# Patient Record
Sex: Male | Born: 1940 | Race: White | Hispanic: No | Marital: Single | Smoking: Never smoker
Health system: Southern US, Community
[De-identification: ages and names within clinical notes are randomized; demographics above are authoritative.]

## PROBLEM LIST (undated history)

## (undated) DIAGNOSIS — M7552 Bursitis of left shoulder: Secondary | ICD-10-CM

## (undated) DIAGNOSIS — Z8719 Personal history of other diseases of the digestive system: Secondary | ICD-10-CM

## (undated) DIAGNOSIS — I10 Essential (primary) hypertension: Secondary | ICD-10-CM

## (undated) DIAGNOSIS — Z9289 Personal history of other medical treatment: Secondary | ICD-10-CM

## (undated) DIAGNOSIS — G8929 Other chronic pain: Secondary | ICD-10-CM

## (undated) DIAGNOSIS — I442 Atrioventricular block, complete: Secondary | ICD-10-CM

## (undated) DIAGNOSIS — Z9861 Coronary angioplasty status: Secondary | ICD-10-CM

## (undated) DIAGNOSIS — I255 Ischemic cardiomyopathy: Secondary | ICD-10-CM

## (undated) DIAGNOSIS — I4891 Unspecified atrial fibrillation: Secondary | ICD-10-CM

## (undated) DIAGNOSIS — E782 Mixed hyperlipidemia: Secondary | ICD-10-CM

## (undated) DIAGNOSIS — G709 Myoneural disorder, unspecified: Secondary | ICD-10-CM

## (undated) DIAGNOSIS — M75102 Unspecified rotator cuff tear or rupture of left shoulder, not specified as traumatic: Secondary | ICD-10-CM

## (undated) DIAGNOSIS — I480 Paroxysmal atrial fibrillation: Secondary | ICD-10-CM

## (undated) DIAGNOSIS — G4733 Obstructive sleep apnea (adult) (pediatric): Secondary | ICD-10-CM

## (undated) DIAGNOSIS — I2510 Atherosclerotic heart disease of native coronary artery without angina pectoris: Secondary | ICD-10-CM

## (undated) DIAGNOSIS — R351 Nocturia: Secondary | ICD-10-CM

## (undated) DIAGNOSIS — M1990 Unspecified osteoarthritis, unspecified site: Secondary | ICD-10-CM

## (undated) DIAGNOSIS — Z951 Presence of aortocoronary bypass graft: Secondary | ICD-10-CM

## (undated) DIAGNOSIS — A809 Acute poliomyelitis, unspecified: Secondary | ICD-10-CM

## (undated) DIAGNOSIS — Z87442 Personal history of urinary calculi: Secondary | ICD-10-CM

## (undated) DIAGNOSIS — G4730 Sleep apnea, unspecified: Secondary | ICD-10-CM

## (undated) DIAGNOSIS — D229 Melanocytic nevi, unspecified: Secondary | ICD-10-CM

## (undated) DIAGNOSIS — Z950 Presence of cardiac pacemaker: Secondary | ICD-10-CM

## (undated) DIAGNOSIS — M7522 Bicipital tendinitis, left shoulder: Secondary | ICD-10-CM

## (undated) DIAGNOSIS — M19012 Primary osteoarthritis, left shoulder: Secondary | ICD-10-CM

## (undated) DIAGNOSIS — T884XXA Failed or difficult intubation, initial encounter: Secondary | ICD-10-CM

## (undated) DIAGNOSIS — E119 Type 2 diabetes mellitus without complications: Secondary | ICD-10-CM

## (undated) DIAGNOSIS — C4492 Squamous cell carcinoma of skin, unspecified: Secondary | ICD-10-CM

## (undated) DIAGNOSIS — S32009K Unspecified fracture of unspecified lumbar vertebra, subsequent encounter for fracture with nonunion: Secondary | ICD-10-CM

## (undated) DIAGNOSIS — G14 Postpolio syndrome: Secondary | ICD-10-CM

## (undated) DIAGNOSIS — Z7901 Long term (current) use of anticoagulants: Secondary | ICD-10-CM

## (undated) DIAGNOSIS — N183 Chronic kidney disease, stage 3 unspecified: Secondary | ICD-10-CM

## (undated) DIAGNOSIS — I495 Sick sinus syndrome: Secondary | ICD-10-CM

## (undated) DIAGNOSIS — M549 Dorsalgia, unspecified: Secondary | ICD-10-CM

## (undated) HISTORY — DX: Melanocytic nevi, unspecified: D22.9

## (undated) HISTORY — PX: EYE SURGERY: SHX253

## (undated) HISTORY — PX: CARDIAC CATHETERIZATION: SHX172

## (undated) HISTORY — PX: OTHER SURGICAL HISTORY: SHX169

## (undated) HISTORY — PX: HERNIA REPAIR: SHX51

## (undated) HISTORY — DX: Personal history of other medical treatment: Z92.89

## (undated) HISTORY — PX: CORONARY ARTERY BYPASS GRAFT: SHX141

## (undated) HISTORY — PX: SHOULDER ARTHROSCOPY: SHX128

## (undated) HISTORY — PX: CARPAL TUNNEL RELEASE: SHX101

## (undated) HISTORY — PX: APPENDECTOMY: SHX54

## (undated) HISTORY — DX: Squamous cell carcinoma of skin, unspecified: C44.92

## (undated) HISTORY — PX: LEG SURGERY: SHX1003

## (undated) HISTORY — PX: NASAL SEPTUM SURGERY: SHX37

## (undated) HISTORY — PX: BACK SURGERY: SHX140

## (undated) HISTORY — PX: POSTERIOR LUMBAR FUSION: SHX6036

## (undated) HISTORY — PX: PACEMAKER INSERTION: SHX728

## (undated) HISTORY — PX: TONSILLECTOMY: SUR1361

## (undated) HISTORY — PX: CORONARY ANGIOPLASTY: SHX604

## (undated) HISTORY — PX: FRACTURE SURGERY: SHX138

## (undated) HISTORY — PX: BILATERAL CARPAL TUNNEL RELEASE: SHX6508

## (undated) HISTORY — PX: JOINT REPLACEMENT: SHX530

---

## 1988-02-07 HISTORY — PX: CORONARY ARTERY BYPASS GRAFT: SHX141

## 1997-05-05 ENCOUNTER — Encounter: Admission: RE | Admit: 1997-05-05 | Discharge: 1997-08-03 | Payer: Self-pay | Admitting: Cardiology

## 1997-05-30 ENCOUNTER — Ambulatory Visit: Admission: RE | Admit: 1997-05-30 | Discharge: 1997-05-30 | Payer: Self-pay | Admitting: Internal Medicine

## 1997-09-02 ENCOUNTER — Encounter (HOSPITAL_COMMUNITY): Admission: RE | Admit: 1997-09-02 | Discharge: 1997-12-01 | Payer: Self-pay | Admitting: Cardiology

## 1997-10-20 ENCOUNTER — Ambulatory Visit (HOSPITAL_BASED_OUTPATIENT_CLINIC_OR_DEPARTMENT_OTHER): Admission: RE | Admit: 1997-10-20 | Discharge: 1997-10-20 | Payer: Self-pay | Admitting: Orthopedic Surgery

## 1998-05-05 ENCOUNTER — Observation Stay (HOSPITAL_COMMUNITY): Admission: AD | Admit: 1998-05-05 | Discharge: 1998-05-06 | Payer: Self-pay | Admitting: Cardiology

## 1998-08-13 ENCOUNTER — Inpatient Hospital Stay (HOSPITAL_COMMUNITY): Admission: RE | Admit: 1998-08-13 | Discharge: 1998-08-16 | Payer: Self-pay | Admitting: Internal Medicine

## 1998-10-15 ENCOUNTER — Observation Stay (HOSPITAL_COMMUNITY): Admission: AD | Admit: 1998-10-15 | Discharge: 1998-10-16 | Payer: Self-pay | Admitting: Cardiology

## 1998-12-17 ENCOUNTER — Ambulatory Visit (HOSPITAL_BASED_OUTPATIENT_CLINIC_OR_DEPARTMENT_OTHER): Admission: RE | Admit: 1998-12-17 | Discharge: 1998-12-17 | Payer: Self-pay | Admitting: Orthopedic Surgery

## 1999-06-14 ENCOUNTER — Ambulatory Visit (HOSPITAL_COMMUNITY): Admission: RE | Admit: 1999-06-14 | Discharge: 1999-06-14 | Payer: Self-pay | Admitting: Gastroenterology

## 2000-01-09 ENCOUNTER — Ambulatory Visit (HOSPITAL_COMMUNITY): Admission: RE | Admit: 2000-01-09 | Discharge: 2000-01-09 | Payer: Self-pay | Admitting: Neurosurgery

## 2000-01-12 ENCOUNTER — Encounter: Payer: Self-pay | Admitting: Neurosurgery

## 2000-01-12 ENCOUNTER — Ambulatory Visit (HOSPITAL_COMMUNITY): Admission: RE | Admit: 2000-01-12 | Discharge: 2000-01-12 | Payer: Self-pay | Admitting: Neurosurgery

## 2000-09-18 ENCOUNTER — Encounter: Admission: RE | Admit: 2000-09-18 | Discharge: 2000-12-17 | Payer: Self-pay | Admitting: *Deleted

## 2001-04-09 ENCOUNTER — Ambulatory Visit (HOSPITAL_COMMUNITY)
Admission: RE | Admit: 2001-04-09 | Discharge: 2001-04-09 | Payer: Self-pay | Admitting: Physical Medicine and Rehabilitation

## 2001-04-09 ENCOUNTER — Encounter: Payer: Self-pay | Admitting: Physical Medicine and Rehabilitation

## 2001-12-17 ENCOUNTER — Encounter: Payer: Self-pay | Admitting: Cardiology

## 2001-12-17 ENCOUNTER — Ambulatory Visit (HOSPITAL_COMMUNITY): Admission: RE | Admit: 2001-12-17 | Discharge: 2001-12-17 | Payer: Self-pay | Admitting: Cardiology

## 2002-02-06 HISTORY — PX: OTHER SURGICAL HISTORY: SHX169

## 2002-02-06 HISTORY — PX: CORONARY ARTERY BYPASS GRAFT: SHX141

## 2002-04-02 ENCOUNTER — Encounter: Admission: RE | Admit: 2002-04-02 | Discharge: 2002-04-02 | Payer: Self-pay | Admitting: Cardiology

## 2002-04-02 ENCOUNTER — Encounter: Payer: Self-pay | Admitting: Cardiology

## 2002-04-04 ENCOUNTER — Ambulatory Visit (HOSPITAL_COMMUNITY): Admission: RE | Admit: 2002-04-04 | Discharge: 2002-04-04 | Payer: Self-pay | Admitting: Cardiology

## 2002-04-07 ENCOUNTER — Ambulatory Visit (HOSPITAL_COMMUNITY): Admission: RE | Admit: 2002-04-07 | Discharge: 2002-04-07 | Payer: Self-pay | Admitting: Cardiology

## 2002-06-27 ENCOUNTER — Encounter: Admission: RE | Admit: 2002-06-27 | Discharge: 2002-06-27 | Payer: Self-pay | Admitting: Thoracic Diseases

## 2003-06-08 ENCOUNTER — Encounter: Admission: RE | Admit: 2003-06-08 | Discharge: 2003-06-08 | Payer: Self-pay | Admitting: Neurosurgery

## 2003-12-28 ENCOUNTER — Inpatient Hospital Stay (HOSPITAL_COMMUNITY): Admission: RE | Admit: 2003-12-28 | Discharge: 2003-12-30 | Payer: Self-pay | Admitting: Orthopedic Surgery

## 2004-02-07 HISTORY — PX: TOTAL KNEE ARTHROPLASTY: SHX125

## 2005-06-06 ENCOUNTER — Encounter: Admission: RE | Admit: 2005-06-06 | Discharge: 2005-06-06 | Payer: Self-pay | Admitting: Cardiology

## 2005-06-09 ENCOUNTER — Ambulatory Visit (HOSPITAL_COMMUNITY): Admission: RE | Admit: 2005-06-09 | Discharge: 2005-06-09 | Payer: Self-pay | Admitting: Cardiology

## 2005-12-08 ENCOUNTER — Ambulatory Visit (HOSPITAL_COMMUNITY): Admission: RE | Admit: 2005-12-08 | Discharge: 2005-12-09 | Payer: Self-pay | Admitting: General Surgery

## 2006-02-06 HISTORY — PX: CARDIAC PACEMAKER PLACEMENT: SHX583

## 2006-02-09 ENCOUNTER — Ambulatory Visit: Payer: Self-pay | Admitting: Internal Medicine

## 2006-03-12 ENCOUNTER — Encounter: Admission: RE | Admit: 2006-03-12 | Discharge: 2006-03-12 | Payer: Self-pay | Admitting: *Deleted

## 2006-03-16 ENCOUNTER — Inpatient Hospital Stay (HOSPITAL_COMMUNITY): Admission: RE | Admit: 2006-03-16 | Discharge: 2006-03-17 | Payer: Self-pay | Admitting: *Deleted

## 2006-04-17 ENCOUNTER — Ambulatory Visit (HOSPITAL_COMMUNITY): Admission: RE | Admit: 2006-04-17 | Discharge: 2006-04-17 | Payer: Self-pay | Admitting: General Surgery

## 2006-07-17 ENCOUNTER — Observation Stay (HOSPITAL_COMMUNITY): Admission: AD | Admit: 2006-07-17 | Discharge: 2006-07-18 | Payer: Self-pay | Admitting: Cardiology

## 2006-12-21 ENCOUNTER — Ambulatory Visit (HOSPITAL_COMMUNITY): Admission: RE | Admit: 2006-12-21 | Discharge: 2006-12-21 | Payer: Self-pay | Admitting: Orthopedic Surgery

## 2007-05-30 ENCOUNTER — Observation Stay (HOSPITAL_COMMUNITY): Admission: RE | Admit: 2007-05-30 | Discharge: 2007-05-31 | Payer: Self-pay | Admitting: General Surgery

## 2007-12-12 DIAGNOSIS — E669 Obesity, unspecified: Secondary | ICD-10-CM

## 2007-12-12 DIAGNOSIS — I4891 Unspecified atrial fibrillation: Secondary | ICD-10-CM

## 2007-12-12 DIAGNOSIS — G4733 Obstructive sleep apnea (adult) (pediatric): Secondary | ICD-10-CM | POA: Insufficient documentation

## 2007-12-12 DIAGNOSIS — E1169 Type 2 diabetes mellitus with other specified complication: Secondary | ICD-10-CM | POA: Insufficient documentation

## 2007-12-12 DIAGNOSIS — I4819 Other persistent atrial fibrillation: Secondary | ICD-10-CM | POA: Insufficient documentation

## 2007-12-12 DIAGNOSIS — B91 Sequelae of poliomyelitis: Secondary | ICD-10-CM | POA: Insufficient documentation

## 2007-12-12 DIAGNOSIS — E1122 Type 2 diabetes mellitus with diabetic chronic kidney disease: Secondary | ICD-10-CM | POA: Insufficient documentation

## 2007-12-12 DIAGNOSIS — I25718 Atherosclerosis of autologous vein coronary artery bypass graft(s) with other forms of angina pectoris: Secondary | ICD-10-CM | POA: Insufficient documentation

## 2007-12-12 DIAGNOSIS — I2510 Atherosclerotic heart disease of native coronary artery without angina pectoris: Secondary | ICD-10-CM | POA: Insufficient documentation

## 2007-12-12 DIAGNOSIS — Z951 Presence of aortocoronary bypass graft: Secondary | ICD-10-CM | POA: Insufficient documentation

## 2007-12-13 ENCOUNTER — Ambulatory Visit: Payer: Self-pay | Admitting: Internal Medicine

## 2008-01-22 ENCOUNTER — Telehealth: Payer: Self-pay | Admitting: Internal Medicine

## 2008-03-10 ENCOUNTER — Ambulatory Visit (HOSPITAL_COMMUNITY): Admission: RE | Admit: 2008-03-10 | Discharge: 2008-03-10 | Payer: Self-pay | Admitting: Internal Medicine

## 2008-06-29 DIAGNOSIS — Z9289 Personal history of other medical treatment: Secondary | ICD-10-CM

## 2008-06-29 HISTORY — DX: Personal history of other medical treatment: Z92.89

## 2008-11-11 ENCOUNTER — Encounter: Admission: RE | Admit: 2008-11-11 | Discharge: 2008-11-11 | Payer: Self-pay | Admitting: Cardiology

## 2008-11-17 ENCOUNTER — Ambulatory Visit: Payer: Self-pay | Admitting: Internal Medicine

## 2008-11-18 ENCOUNTER — Encounter: Payer: Self-pay | Admitting: Internal Medicine

## 2008-12-02 ENCOUNTER — Ambulatory Visit (HOSPITAL_COMMUNITY): Admission: RE | Admit: 2008-12-02 | Discharge: 2008-12-02 | Payer: Self-pay | Admitting: Orthopedic Surgery

## 2009-09-07 ENCOUNTER — Encounter: Admission: RE | Admit: 2009-09-07 | Discharge: 2009-09-07 | Payer: Self-pay | Admitting: Cardiology

## 2009-10-15 ENCOUNTER — Encounter: Payer: Self-pay | Admitting: Internal Medicine

## 2009-10-15 ENCOUNTER — Ambulatory Visit (HOSPITAL_COMMUNITY): Admission: RE | Admit: 2009-10-15 | Discharge: 2009-10-15 | Payer: Self-pay | Admitting: Cardiology

## 2009-10-22 ENCOUNTER — Ambulatory Visit (HOSPITAL_COMMUNITY): Admission: RE | Admit: 2009-10-22 | Discharge: 2009-10-22 | Payer: Self-pay | Admitting: Cardiology

## 2009-10-22 ENCOUNTER — Encounter: Payer: Self-pay | Admitting: Internal Medicine

## 2009-11-10 ENCOUNTER — Encounter: Payer: Self-pay | Admitting: Internal Medicine

## 2009-12-23 ENCOUNTER — Ambulatory Visit: Payer: Self-pay | Admitting: Internal Medicine

## 2009-12-23 DIAGNOSIS — R0989 Other specified symptoms and signs involving the circulatory and respiratory systems: Secondary | ICD-10-CM

## 2009-12-23 DIAGNOSIS — J984 Other disorders of lung: Secondary | ICD-10-CM | POA: Insufficient documentation

## 2009-12-23 DIAGNOSIS — R0609 Other forms of dyspnea: Secondary | ICD-10-CM

## 2009-12-29 LAB — CONVERTED CEMR LAB
Basophils Absolute: 0 10*3/uL (ref 0.0–0.1)
Eosinophils Absolute: 0.2 10*3/uL (ref 0.0–0.7)
Eosinophils Relative: 2.6 % (ref 0.0–5.0)
HCT: 40.2 % (ref 39.0–52.0)
Hemoglobin: 13.9 g/dL (ref 13.0–17.0)
MCHC: 34.5 g/dL (ref 30.0–36.0)
MCV: 92.1 fL (ref 78.0–100.0)
Monocytes Absolute: 0.6 10*3/uL (ref 0.1–1.0)
Neutro Abs: 3.9 10*3/uL (ref 1.4–7.7)
Neutrophils Relative %: 64.3 % (ref 43.0–77.0)
WBC: 6.1 10*3/uL (ref 4.5–10.5)

## 2010-01-03 ENCOUNTER — Ambulatory Visit: Payer: Self-pay | Admitting: Internal Medicine

## 2010-01-28 ENCOUNTER — Ambulatory Visit: Payer: Self-pay | Admitting: Internal Medicine

## 2010-03-08 NOTE — Assessment & Plan Note (Signed)
Summary: SIX MIN WALK-PULM STRESS TEST  Nurse Visit   Vital Signs:  Patient profile:   70 year old male Pulse rate:   80 / minute BP sitting:   128 / 70  Medications Prior to Update: 1)  Metformin Hcl 1000 Mg Tabs (Metformin Hcl) 2)  Lisinopril-Hydrochlorothiazide 20-25 Mg Tabs (Lisinopril-Hydrochlorothiazide) .... Take 1 Tablet By Mouth Once A Day 3)  Fish Oil 1000 Mg Caps (Omega-3 Fatty Acids) .... Take 1 Tablet By Mouth Once A Day 4)  Glipizide 5 Mg Tabs (Glipizide) 5)  Crestor 10 Mg Tabs (Rosuvastatin Calcium) .... Take 1 Tablet By Mouth Once A Day 6)  Bayer Aspirin 325 Mg Tabs (Aspirin) .... Take 1 Tablet By Mouth Once A Day 7)  Flax Seed Oil 1000 Mg Caps (Flaxseed (Linseed)) .... Take 1 Tablet By Mouth Once A Day 8)  Cpap  11 Cwp Advanced 9)  Tramadol Hcl 50 Mg Tabs (Tramadol Hcl) .... As Needed 10)  Norvasc 5 Mg Tabs (Amlodipine Besylate) .... Take 1 Tablets Two Times A Day 11)  Carvedilol 12.5 Mg Tabs (Carvedilol) .... Take 1 Tablets Two Times A Day 12)  Lantus 100 Unit/ml Soln (Insulin Glargine) .... 30u Two Times A Day  Allergies: No Known Drug Allergies  Orders Added: 1)  Pulmonary Stress (6 min walk) [94620]   Six Minute Walk Test Medications taken before test(dose and time): 1)  Metformin Hcl 1000 Mg Tabs (Metformin Hcl) 2)  Lisinopril-Hydrochlorothiazide 20-25 Mg Tabs (Lisinopril-Hydrochlorothiazide) .... Take 1 Tablet By Mouth Once A Day 3)  Fish Oil 1000 Mg Caps (Omega-3 Fatty Acids) .... Take 1 Tablet By Mouth Once A Day 4)  Glipizide 5 Mg Tabs (Glipizide) 5)  Crestor 10 Mg Tabs (Rosuvastatin Calcium) .... Take 1 Tablet By Mouth Once A Day 6)  Bayer Aspirin 325 Mg Tabs (Aspirin) .... Take 1 Tablet By Mouth Once A Day 7)  Flax Seed Oil 1000 Mg Caps (Flaxseed (Linseed)) .... Take 1 Tablet By Mouth Once A Day 8)  Cpap  11 Cwp Advanced 9)  Tramadol Hcl 50 Mg Tabs (Tramadol Hcl) .... As Needed 10)  Norvasc 5 Mg Tabs (Amlodipine Besylate) .... Take 1 Tablets Two  Times A Day 11)  Carvedilol 12.5 Mg Tabs (Carvedilol) .... Take 1 Tablets Two Times A Day 12)  Lantus 100 Unit/ml Soln (Insulin Glargine) .... 30u Two Times A Day Pt took all meds at 7am today Supplemental oxygen during the test: No  Lap counter(place a tick mark inside a square for each lap completed) lap 1 complete  lap 2 complete   lap 3 complete   lap 4 complete  lap 5 complete  lap 6 complete  lap 7 complete   lap 8 complete   lap 9 complete  lap 10 complete   Baseline  BP sitting: 128/ 70 Heart rate: 80 Dyspnea ( Borg scale) 3 Fatigue (Borg scale) 0 SPO2 99  End Of Test  BP sitting: 138/ 78 Heart rate: 107 Dyspnea ( Borg scale) 2 Fatigue (Borg scale) 0 SPO2 99  2 Minutes post  BP sitting: 132/ 76 Heart rate: 62 SPO2 97  Stopped or paused before six minutes? No  Interpretation: Number of laps  10 X 48 meters =   480 meters+ final partial lap: 26 meters =    meters   Total distance walked in six minutes: 506 meters  Tech ID: Cooper Render, CNA (January 03, 2010 12:42 PM) Tech Comments Pt completed test w/ 0 rest breaks and 0  complaints.

## 2010-03-08 NOTE — Cardiovascular Report (Signed)
Summary: Dub Mikes MD/[facility] MD/[facility]   Imported By: Bubba Hales 12/28/2009 11:49:08  _____________________________________________________________________  External Attachment:    Type:   Image     Comment:   External Document

## 2010-03-08 NOTE — Assessment & Plan Note (Signed)
Summary: ABNORMAL PFT'S //KP   Primary Provider/Referring Provider:  Veronia Beets, MD  CC:  .n .  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  He has continued CPAP used at every night at 8 CWP.  He prefers a cool humidifier, and actually puts ice in the tray.  Auto titration was done recently recommending a pressure at 10 CWP.  His current machine is worn out.  Otherwise, he is being considered for pacemaker placement.  He had a recent hernia repair under general anesthesia with no problems.   December 21, 2007- OSA, Hx UPPP. Thinks cpap not high enough. autotitrated 12/07 to 10, but we think he is still on 8. Retired, loose schedule but feels unrested. 70 yo last seen in 2009. Over past year he is newly aware of sensation he is unable to take in enough air with any exertion like walking after his cat. Denies cough, wheeze. Denies chest pain, palpitation. Stable sock-line edema. Weight up 7 lbs since 2009.  Had seen Dr Erling Cruz several years ago for postpolio syndrome- polio had involved his legs. Saw Dr Rex Kras and had outpatient w/u with neg CT, stable CAD on cath, EF 50%. Had ablation/ pacemaker for AFib.  Fully compliant and comfortable with CPAP at 8 every night. Still flies plane. PFT10/12/10- WNL FEV1/FVC 0.80 PFT 10/22/09- FEV1/FVC 0.82 CT        Preventive Screening-Counseling & Management  Alcohol-Tobacco     Smoking Status: never  Current Medications (verified): 1)  Metformin Hcl 1000 Mg Tabs (Metformin Hcl) 2)  Lisinopril-Hydrochlorothiazide 20-25 Mg Tabs (Lisinopril-Hydrochlorothiazide) .... Take 1 Tablet By Mouth Once A Day 3)  Fish Oil 1000 Mg Caps (Omega-3 Fatty Acids) .... Take 1 Tablet By Mouth Once A  Day 4)  Glipizide 5 Mg Tabs (Glipizide) 5)  Crestor 10 Mg Tabs (Rosuvastatin Calcium) .... Take 1 Tablet By Mouth Once A Day 6)  Bayer Aspirin 325 Mg Tabs (Aspirin) .... Take 1 Tablet By Mouth Once A Day 7)  Flax Seed Oil 1000 Mg Caps (Flaxseed (Linseed)) .... Take 1 Tablet By Mouth Once A Day 8)  Cpap  11 Cwp Advanced 9)  Tramadol Hcl 50 Mg Tabs (Tramadol Hcl) .... As Needed 10)  Norvasc 5 Mg Tabs (Amlodipine Besylate) .... Take 1 Tablets Two Times A Day 11)  Carvedilol 12.5 Mg Tabs (Carvedilol) .... Take 1 Tablets Two Times A Day 12)  Lantus 100 Unit/ml Soln (Insulin Glargine) .... 30u Two Times A Day  Allergies: No Known Drug Allergies  Past History:  Past Surgical History: Last updated: Dec 21, 2007 UPPPP Pacemaker Hx CABG  Family History: Last updated: 12-21-07 Father- died MVA Mother- died cancer  Social History: Last updated: 12/23/2009 Patient never smoked.  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  The patient denies shortness of breath at rest, productive cough, non-productive cough, coughing up blood, chest pain, irregular heartbeats, acid heartburn, indigestion, loss of appetite, weight change, abdominal pain, difficulty swallowing, sore throat, tooth/dental problems, headaches, nasal congestion/difficulty breathing through nose, sneezing, itching, ear ache, rash, change in color of mucus,  and fever.    Vital Signs:  Patient profile:   70 year old male Height:      70 inches Weight:      253.38 pounds BMI:     36.49 O2 Sat:      95 % on Room air Pulse rate:   82 / minute BP sitting:   120 / 68  (left arm) Cuff size:   large  Vitals Entered By: Donita Brooks RN (December 23, 2009 9:29 AM)  O2 Flow:  Room air CC: .n  Is Patient Diabetic? Yes Comments Medications reviewed with patient ,phone # verfied. Donita Brooks RN  December 23, 2009 9:29 AM    Physical Exam  Additional Exam:  General: A/Ox3; pleasant and cooperative, NAD, obese SKIN: no rash, lesions NODES: no lymphadenopathy HEENT: [place]/AT, EOM- WNL, Conjuctivae- clear, PERRLA, TM-WNL, Nose- clear, Throat- clear and wnl, Mallampati III-IV s/p UPPP NECK: Supple w/ fair ROM, JVD- trace, normal carotid impulses w/o bruits Thyroid- normal to palpation CHEST: Clear to P&A HEART: RRR, no m/g/r heard ABDOMEN: significant abdominal obesity [id number], nl pulses, sock-line edema  NEURO: Grossly intact to observation      Impression & Recommendations:  Problem # 1:  DYSPNEA ON EXERTION (ICD-786.09)  He is quite obese and has put on more weight in past year. Together w/ his cardiac meds he may just be outrunning his heart. I will get formal PFT and 6 MWT and check for anemia. His simple spirometry 10/22/09 at [facility] doesn't reveal any change in spirometry flows from a year prior. There is definitie sock-line edema, so he is carrying some water weight.  His updated medication list for this problem includes:    Lisinopril-hydrochlorothiazide 20-25 Mg Tabs (Lisinopril-hydrochlorothiazide) .[name] Kitchen... Take 1 tablet by mouth once a day    Carvedilol 12.5 Mg Tabs (Carvedilol) .[name] Kitchen... 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 He is not recognizing distinct leg weakeness, but I discussed the post-polio process. If any question he should be reassessed by his  neurologist.  Medications Added to Medication List This Visit: 1)  Norvasc 5 Mg Tabs (Amlodipine besylate) .... Take 1 tablets two times a day 2)  Carvedilol 12.5 Mg Tabs (Carvedilol) .... Take 1 tablets two times a day 3)  Lantus 100 Unit/ml Soln (Insulin glargine) .... 30u two times a day  Other Orders: Est. Patient Level IV [id number]) Misc. Referral (Misc. Ref) TLB-CBC Platelet - w/Differential ([id number]-[id number])  Patient Instructions: 1)  Please schedule a follow-up appointment in 1 month. 2)  Lab 3)  See Laurel to schedule PFT and 6 MWT 4)  Sample Spiriva, 1 daily

## 2010-03-08 NOTE — Miscellaneous (Signed)
Summary: Orders Update pft charges  Clinical Lists Changes  Orders: Added new Service order of Carbon Monoxide diffusing w/capacity (94720) - Signed Added new Service order of Lung Volumes (94240) - Signed Added new Service order of Spirometry (Pre & Post) (94060) - Signed 

## 2010-03-10 NOTE — Assessment & Plan Note (Signed)
Summary: 1 month return/mhh   Primary Provider/Referring Provider:  Veronia Beets, MD  CC:  1 month follow up.  Pt states breathing has improved but still having SOB with exertion.  Denies wheezing, chest tightness, and cough..  History of Present Illness:  12/23/09- OSA, Hx UPPP. Thinks cpap not high enough. autotitrated 12/07 to 10, but we think he is still on 8. Retired, loose schedule but feels unrested. 70 yo last seen in 2009. Over past year he is newly aware of sensation he is unable to take in enough air with any exertion like walking after his cat. Denies cough, wheeze. Denies chest pain, palpitation. Stable sock-line edema. Weight up 7 lbs since 2009.  Had seen Dr Erling Cruz several years ago for postpolio syndrome- polio had involved his legs. Saw Dr Rex Kras and had outpatient w/u with neg CT, stable CAD on cath, EF 50%. Had ablation/ pacemaker for AFib.  Fully compliant and comfortable with CPAP at 8 every night. 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  Pt states breathing has improved but still having SOB with exertion.  Denies wheezing, chest tightness, cough. Dyspnea seems better- still concerned about dyspnea with exertion. No cough, wheeze or chest pain. CT chest had been negative for active process or any basis for dyspnea.  CBC- Hg 13.9- not anemic 6MWT- 99%, 99%, 99%  506 meters. He is unclear about status of his post polio syndrome. He hasn't seen Katherina Right in years.       Preventive Screening-Counseling & Management  Alcohol-Tobacco     Smoking Status: never  Current Medications (verified): 1)  Metformin Hcl 1000 Mg Tabs (Metformin Hcl) .... Take 1 Tablet By Mouth Once A Day 2)  Lisinopril-Hydrochlorothiazide 20-25 Mg Tabs (Lisinopril-Hydrochlorothiazide) .... Take 1 Tablet By Mouth Once A Day 3)  Fish Oil 1000 Mg Caps (Omega-3 Fatty  Acids) .... Take 1 Tablet By Mouth Once A Day 4)  Glipizide 10 Mg Tabs (Glipizide) .... Take 1 Tablet By Mouth Two Times A Day As Needed 5)  Crestor 10 Mg Tabs (Rosuvastatin Calcium) .... Take 1 Tablet By Mouth Once A Day 6)  Bayer Aspirin 325 Mg Tabs (Aspirin) .... Take 1 Tablet By Mouth Once A Day 7)  Flax Seed Oil 1000 Mg Caps (Flaxseed (Linseed)) .... Take 1 Tablet By Mouth Once A Day 8)  Cpap  11 Cwp Advanced 9)  Tramadol Hcl 50 Mg Tabs (Tramadol Hcl) .... As Needed 10)  Norvasc 5 Mg Tabs (Amlodipine Besylate) .... Take 1 Tablets Two Times A Day 11)  Carvedilol 12.5 Mg Tabs (Carvedilol) .... Take 1 Tablets Two Times A Day 12)  Lantus 100 Unit/ml Soln (Insulin Glargine) .... 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  The patient denies shortness of breath at rest, productive cough, non-productive cough, coughing up blood, chest pain, irregular heartbeats, acid heartburn, indigestion, loss of appetite, weight change, abdominal pain, difficulty swallowing, sore throat, tooth/dental problems, headaches, nasal congestion/difficulty breathing through nose, and sneezing.    Vital Signs:  Patient profile:   70 year old  male Height:      70 inches Weight:      261.13 pounds BMI:     37.60 O2 Sat:      96 % on Room air Pulse rate:   84 / minute BP sitting:   132 / 76  (right arm) Cuff size:   large  Vitals Entered By: Raymondo Band RN (January 28, 2010 9:07 AM)  O2 Flow:  Room air CC: 1 month follow up.  Pt states breathing has improved but still having SOB with exertion.  Denies wheezing, chest tightness, cough. Comments Medications reviewed with patient Daytime contact number verified with patient. Raymondo Band RN  January 28, 2010 9:07 AM    Physical Exam  Additional Exam:  General: A/Ox3; pleasant and cooperative, NAD, obese/ stocky SKIN: no rash, lesions NODES: no lymphadenopathy HEENT: [place]/AT, EOM- WNL, Conjuctivae- clear, PERRLA, TM-WNL, Nose- clear, Throat- clear and wnl, Mallampati III-IV s/p UPPP NECK: Supple w/ fair ROM, JVD- trace, normal carotid impulses w/o bruits Thyroid- normal to palpation CHEST: Clear to P&A, minor cough after deep breath HEART: RRR, no m/g/r heard ABDOMEN: significant abdominal obesity [id number], nl pulses, sock-line edema  NEURO: Grossly intact to observation      Impression & Recommendations:  Problem # 1:  DYSPNEA ON EXERTION (ICD-786.09)  He describes legs getting stiff and tired without claudication. We will ask [name] if there is peripheral arterial disease. Otherwise I think what he is describing is likely related to his Post Polio syndrome. I suggested he get back with Dr Erling Cruz who had seen him years ago and told him then "he would be in a wheel chair in 5 years" I did present the opportunity for a cardio pulmonary exercise stres test- he wants to wait and see hwo he feels after Xmas.  His updated medication list for this problem includes:    Lisinopril-hydrochlorothiazide 20-25 Mg Tabs (Lisinopril-hydrochlorothiazide) .[name] Kitchen... Take 1 tablet by mouth once a day    Carvedilol 12.5 Mg Tabs (Carvedilol) .[name] Kitchen... Take 1 tablets two times a  day  Problem # 2:  OBSTRUCTIVE SLEEP APNEA (ICD-327.23) Doing well with CPAP- remains fully compliant and it helps  Medications Added to Medication List This Visit: 1)  Metformin Hcl 1000 Mg Tabs (Metformin hcl) .... Take 1 tablet by mouth once a day 2)  Glipizide 10 Mg Tabs (Glipizide) .... Take 1 tablet by mouth two times a day as needed  Other Orders: Est. Patient Level III [id number])  Patient Instructions: 1)  Please schedule a follow-up appointment in 1 year for f/u of sleep apnea. Please call or return sooner as needed.  2)  We will ask Dr Rex Kras about possibility of peripheral arterial disease as a cause of leg fatigue. 3)  Suggest you see Dr Katherina Right again for reassessment of your Post Polio syndrome as this may be what is going on.  4)  Continue CPAPat 11   Immunization History:  Influenza Immunization History:    Influenza:  historical (11/06/2009)

## 2010-03-31 ENCOUNTER — Other Ambulatory Visit: Payer: Self-pay | Admitting: *Deleted

## 2010-03-31 ENCOUNTER — Ambulatory Visit
Admission: RE | Admit: 2010-03-31 | Discharge: 2010-03-31 | Disposition: A | Discharge status: Home / Self Care | Payer: MEDICARE | Source: Ambulatory Visit | Attending: *Deleted | Admitting: *Deleted

## 2010-03-31 DIAGNOSIS — M545 Low back pain, unspecified: Secondary | ICD-10-CM

## 2010-04-21 LAB — BASIC METABOLIC PANEL
BUN: 18 mg/dL (ref 6–23)
Calcium: 9.7 mg/dL (ref 8.4–10.5)
Creatinine, Ser: 1.19 mg/dL (ref 0.4–1.5)
GFR calc non Af Amer: >60 mL/min (ref >60)
Glucose, Bld: 130 mg/dL — ABNORMAL HIGH (ref 70–99)
Potassium: 4.2 mEq/L (ref 3.5–5.1)

## 2010-04-21 LAB — POCT I-STAT 3, VENOUS BLOOD GAS (G3P V)
Bicarbonate: 23.3 mEq/L (ref 20.0–24.0)
O2 Saturation: 67 %
pCO2, Ven: 38.9 mmHg — ABNORMAL LOW (ref 45.0–50.0)
pH, Ven: 7.384 — ABNORMAL HIGH (ref 7.250–7.300)
pO2, Ven: 35 mmHg (ref 30.0–45.0)

## 2010-04-21 LAB — POCT I-STAT 3, ART BLOOD GAS (G3+)

## 2010-04-21 LAB — GLUCOSE, CAPILLARY: Glucose-Capillary: 102 mg/dL — ABNORMAL HIGH (ref 70–99)

## 2010-04-21 LAB — PROTIME-INR: INR: 1.11 (ref 0.00–1.49)

## 2010-04-21 LAB — CBC
Hemoglobin: 14.2 g/dL (ref 13.0–17.0)
RBC: 4.57 MIL/uL (ref 4.22–5.81)
RDW: 12.7 % (ref 11.5–15.5)
WBC: 6.4 10*3/uL (ref 4.0–10.5)

## 2010-04-21 LAB — APTT: aPTT: 26 seconds (ref 24–37)

## 2010-04-21 LAB — D-DIMER, QUANTITATIVE: D-Dimer, Quant: 0.65 ug/mL-FEU — ABNORMAL HIGH (ref 0.00–0.48)

## 2010-05-12 LAB — BASIC METABOLIC PANEL
Calcium: 9.7 mg/dL (ref 8.4–10.5)
GFR calc Af Amer: >60 mL/min (ref >60)
GFR calc non Af Amer: 57 mL/min — ABNORMAL LOW (ref >60)
Sodium: 137 mEq/L (ref 135–145)

## 2010-05-12 LAB — CBC
Hemoglobin: 13.6 g/dL (ref 13.0–17.0)
RBC: 4.19 MIL/uL — ABNORMAL LOW (ref 4.22–5.81)

## 2010-06-21 NOTE — H&P (Signed)
NAMEKABEL, EYLES NO.:  [id number]   MEDICAL RECORD NO.:  [id number]          PATIENT TYPE:  INP   LOCATION:  2023                         FACILITY:  [facility]   PHYSICIAN:  Jeanella Craze. Little, M.D. DATE OF BIRTH:  Nov 29, 1940   DATE OF ADMISSION:  07/17/2006  DATE OF DISCHARGE:                              HISTORY & PHYSICAL   INDICATIONS FOR TEST:  This 70 year old male had bypass surgery  initially in 1990 and redo surgery with the maze procedure in 2004 by  Dr. Amador Cunas in [place], [place].  In February of 2008, he was  having long pauses, symptomatic bradycardia and had a pacemaker placed  by Dr. Tami Ribas.   He was admitted on July 17, 2006 after having chest pain for 4 days.  It  was left upper chest that he describes as a pressure sensation lasting  about an hour, squeezing-like in severity.  He did not try  nitroglycerin.  Since his admission, he has had no recurrent pain.  His  EKG is unremarkable.  His cardiac markers were negative.  He was brought  to the cath lab for cardiac cath and graft visualization.   After obtaining informed consent, the patient was prepped and draped in  the usual sterile fashion exposing the right groin.  Following local  anesthetic with 1% Xylocaine, the Seldinger technique was employed.  Multiple attempts were required to finally cannulate the right femoral  artery.  Left and right coronary arteriography and graft visualization  x2 and ventriculography in the RAO projection was performed.   COMPLICATIONS:  None.   MEDICATIONS:  1. Versed 2 mg.  2. Fentanyl 25 for anxiety.   Total contrast 115 cc.   EQUIPMENT:  5-French Judkins diagnostic catheters and an internal  mammary artery catheter was used to cannulate the left internal mammary  artery.   RESULTS:  1. Hemodynamic monitoring central aortic pressure was 126/62, left      ventricular pressure was 126/3 with no aortic valve gradient noted      at the  time of full back.  2. Ventriculography.  Ventriculography in the RAO projection revealed      the posterior basilar segment to be akinetic.  The remainder of the      wall motions were normal, and the ejection fraction was 50%.  The      end-diastolic pressure was only 13.  No mitral regurgitation was      appreciated.   CORONARY ARTERIOGRAPHY:  1. Left main  2. LAD was 100% occluded at the left main.  3. Circumflex.  The circumflex in the AV groove gave rise to a small      OM2 that had mild distal disease.  The first OM was a large vessel      with mid 30-40% areas of narrowing, and the distal vessel      bifurcated.  4. Right coronary artery 100% occluded proximally.  5. Saphenous vein graft to the PDA.  The graft itself was widely      patent.  It inserted into the midportion of the  PDA, and there was      retrograde filling of the distal right and the posterolateral      vessel which was a large vessel.  The distal portion of the PDA was      diffusely diseased.  There is, however, had not changed from his      2007 cardiac catheterization.  6. Internal mammary artery to the LAD.  The internal mammary artery to      the LAD was widely patent.  You could easily visualize the LAD, and      there was retrograde filling into the diagonal.  Both the LAD and      the diagonal were small with mild to moderate irregularities.  This      too is unchanged from 2007.   CONCLUSION:  1. Ejection fraction 50%.  2. Widely patent graft to the posterior descending artery and internal      mammary artery with mild diffuse disease in the distal posterior      descending artery and mild to moderate irregularity of the left      anterior descending diagonal system.   When compared to the 2007 cath, there are no substantial changes, and I  cannot explain his chest pain based on the current anatomy.  I plan to  discontinue the nitroglycerin, and we will start him on a PPI.  Will  schedule him for  discharge later today.           ______________________________  Jeanella Craze. Little, M.D.     ABL/MEDQ  D:  07/18/2006  T:  07/18/2006  Job:  [id number]   cc:   Darcus Austin, D.O.  Cath Lab

## 2010-06-21 NOTE — Discharge Summary (Signed)
NAME:  MAXTEN, PURI NO.:  [id number]   MEDICAL RECORD NO.:  [id number]          PATIENT TYPE:  OBV   LOCATION:  5148                         FACILITY:  [facility]   PHYSICIAN:  Merri Ray. Grandville Silos, M.D.DATE OF BIRTH:  [birth year]   DATE OF ADMISSION:  05/30/2007  DATE OF DISCHARGE:  05/31/2007                               DISCHARGE SUMMARY   DISCHARGE DIAGNOSES:  1. Chronic right inguinal pain, status pot right inguinal hernia      repair.  2. Status post right inguinal exploration with division of scar tissue      and inguinal branch of the ilioinguinal nerve.   HISTORY OF PRESENT ILLNESS:  Mr. Marschall is a 70 year old gentleman who  underwent a right inguinal hernia repair with mesh by Dr. Lennie Hummer in  2007.  He had a reexploration of his groin in 2008 for chronic pain with  no acute issues found.  He continues to have pain and presented for a  right inguinal exploration on day of admission.   HOSPITAL COURSE:  The patient underwent uncomplicated right inguinal  exploration with division of a significant amount of scar tissue as well  as inguinal branch with the ilioinguinal nerve.  Postoperatively, he  remained afebrile and hemodynamically stable.  He was kept overnight as  he wears his CPAP mask during the night and needs to be watched after  general anesthesia.  He had no respiratory issues and had excellent pain  control and he was discharged home on postoperative day 1 in stable  condition.   DISCHARGE DIET:  Diabetic.   DISCHARGE ACTIVITY:  As tolerated.   DISCHARGE MEDICATIONS:  1. Percocet 5/325 one for every 6 hours as needed for pain.  2. Aspirin 81 mg to resume on June 01, 2007.  3. Lisinopril 20/25 mg daily.  4. Carvedilol 25 mg b.i.d.  5. Glipizide 10 mg b.i.d.  6. Meloxicam 15 mg daily in the evening.  7. Cyclobenzaprine 10 mg q.8 h. p.r.n. spasm.  8. Metformin 1000 mg p.o. b.i.d.  9. Amlodipine 5 mg daily.  10.Crestor 10 mg daily.  11.Lantus 14 units q.a.m.  12.Tramadol 50 mg p.r.n.  13.Zolpidem 10 mg p.r.n.   Followup is with myself in 3 weeks.      Merri Ray Grandville Silos, M.D.  Electronically Signed     BET/MEDQ  D:  05/31/2007  T:  05/31/2007  Job:  [id number]

## 2010-06-21 NOTE — Discharge Summary (Signed)
[name], Alan [name] NO.:  [id number]   MEDICAL RECORD NO.:  [id number]          PATIENT TYPE:  INP   LOCATION:  2023                         FACILITY:  [facility]   PHYSICIAN:  Erlene Quan, P.A.   DATE OF BIRTH:  [birth year]   DATE OF ADMISSION:  07/17/2006  DATE OF DISCHARGE:  07/18/2006                               DISCHARGE SUMMARY   DISCHARGE DIAGNOSES:  1. Chest pain worrisome for unstable angina, no progression of      coronary disease by catheterization this admission.  2. Coronary disease, coronary artery bypass grafting in 1990 with redo      surgery in 2004.  3. Paroxysmal atrial fibrillation and sick sinus syndrome, status post      maze procedure in 2004.  4. PT VDP for a heart block and bradycardia in January 2008 by Dr.      Tami Ribas.  5. Noninsulin-dependant diabetes.  6. Treated hypertension.  7. Treated dyslipidemia.   HOSPITAL COURSE:  The patient is a 70 year old male followed by Dr.  Rex Kras with a history of coronary disease.  His initial surgery in 1990.  In 2004, he had redo bypass with a maze procedure by Dr. Amador Cunas in  [place].  He was seen by Dr. Rex Kras in the office July 13, 2006.  Doing well at that time, but presented as a walk-in July 17, 2006 with  chest pressure that was worrisome for unstable angina.  The patient was  admitted from the office on July 17, 2006 for further evaluation.  The  patient was admitted to telemetry, started on IV heparin and nitrates.  We also added a PPI.  Metformin was held, as was his lisinopril/HCTZ.  He was cathed July 18, 2006, which showed the SVG to the PDA to be  patent.  The distal PDA was unchanged from previous studies, and the  LIMA to the LAD was patent, and the circumflex was patent with a large  1st OM that had 30-40% narrowing.  EF was 50%.  Dr. Rex Kras did not feel  that his anatomy explained his symptoms.  We will send him home later  today on a PPI.  We did increase his Coreg.  We  will hold his metformin  until Saturday.   DISCHARGE MEDICATIONS:  1. Prilosec-OTC 20 mg a day.  2. Lisinopril/HCTZ 20/12.5 once a day to start on June 12.  3. Metformin 500 mg b.i.d. start July 21, 2006.  4. Glipizide 5 mg a day.  5. Lexapro 10 mg a day.  6. Crestor 10 mg a day.  7. Aspirin 81 mg a day.  8. Coreg 6.25 b.i.d.  9. Tramadol 50 mg h.s.   LABORATORY DATA:  White count 6.2, hemoglobin 12.9, hematocrit 38,  platelets 173.  Sodium 137, potassium 4.7, BUN 21, creatinine 1.2.  LFTs  are normal.  Troponins are negative.  Hemoglobin A1c is 6.8.  Chest x-  ray  showed cardiomegaly; otherwise no acute disease.  He is status post  pacemaker.  INR is 1.2.  EKG showed sinus rhythm.   DISPOSITION:  The  patient is discharged in stable condition, and will  follow up with Dr. Rex Kras as an outpatient.      Erlene Quan, P.AMeryl Dare  D:  07/18/2006  T:  07/19/2006  Job:  [id number]   cc:   Darcus Austin, D.O.

## 2010-06-21 NOTE — Op Note (Signed)
NAME:  Alan, [name] NO.:  [id number]   MEDICAL RECORD NO.:  [id number]          PATIENT TYPE:  OBV   LOCATION:  5148                         FACILITY:  [facility]   PHYSICIAN:  Merri Ray. Grandville Silos, M.D.DATE OF BIRTH:  Jul 21, 1940   DATE OF PROCEDURE:  05/30/2007  DATE OF DISCHARGE:                               OPERATIVE REPORT   PREOPERATIVE DIAGNOSIS:  Right inguinal pain, status post right inguinal  hernia repair.   POSTOPERATIVE DIAGNOSIS:  Right inguinal pain, status post right  inguinal hernia repair.   PROCEDURE:  Right inguinal exploration with division of the scar tissue  and inguinal branch of ilioinguinal nerve.   SURGEON:  Merri Ray. Grandville Silos, MD   ANESTHESIA:  General with laryngeal mask airway.   HISTORY OF PRESENT ILLNESS:  Alan [name] is a 70 year old gentleman who  underwent right inguinal hernia repair by Dr. Lennie Hummer in November  2007.  He had postoperative pain and underwent right groin exploration  by Dr. Rosana Hoes in March 2008.  He has persisted with pain and his right  groin extending down to his right scrotum, which is sharp and burning.  He has declined [facility] and then we have  offered right inguinal exploration with attempt to divide inguinal  branch of the ilioinguinal nerve to hopefully relieve this pain.  He  understands that the procedure may not be successful.  He presents for  elective right inguinal exploration today.   PROCEDURE IN DETAIL:  Informed consent was obtained.  The patient was  identified in the preop holding area.  His site was marked.  He received  intravenous antibiotics.  He was brought to the operating room and  general anesthesia with laryngeal mask airway was administered by the  anesthesia staff.  His abdomen and groin were prepped and draped in the  sterile fashion.  A 0.25% Marcaine with epinephrine was injected along  his right inguinal scar from previous surgery.  Incision was made  along  his previous scar.  He does have 2 right inguinal scarrings, the most  superior scar was used.  Subcutaneous tissues were dissected down  through Scarpa fascia down to reveal the external oblique fascia.  There  were significant scar tissue throughout the subcutaneous area.  The  external oblique fascia was cleaned off and identified, and it was then  divided.  The leaflets were dissected free of the underlying tissues.  Cord appeared intact and the mesh appeared scarred in, with no evidence  of hernia present.  Exploration was then done along the lateral aspect  of the opening in the external oblique.  There was significant scar  tissue there, it was divided.  Dissection continued and a structure that  appeared to be there at the inguinal branch of the ilioinguinal nerve  was identified running along its usual course, along with the external  oblique and this was dissected free from the external oblique and  divided.  Similar other scar tissue down this area was also divided as  well as another portion of tissue that appeared to be  __________  accomplished along laterally outwards the anterior-superior iliac spine  dividing some heavy scar tissue there as well, with no discrete nervous  tissue identified.  Some further dissection was done beneath the  external oblique flap on the low side, and a significant amount of scar  tissue was divided.  Area was copiously irrigated, and no other  potential tissue was noted consistent with the inguinal branch of the  ilioinguinal nerve.  The hemostasis was obtained.  Some additional local  anesthetic was injected and the external oblique was closed with a  running 2-0 Vicryl suture.  Subcutaneous tissues were irrigated.  Hemostasis was ensured and the subcutaneous tissues were approximated  with interrupted 3-0 Vicryl sutures and clamped.  The skin was closed  with running 4-0 Monocryl subcuticular stitch.  The sponge, needle, and   instrument counts were  correct.  Benzoin, Steri-Strips, and sterile dressings were applied.  The patient's right testicle was relocated in its correct position in  his scrotum.  After the completion of the case, a sterile dressing was  applied.  The patient tolerated the procedure well without apparent  complications, and was taken to the recovery room in stable condition.      Merri Ray Grandville Silos, M.D.  Electronically Signed     BET/MEDQ  D:  05/30/2007  T:  05/31/2007  Job:  [id number]   cc:   Alan [name], D.O.  Alan [name], M.D.

## 2010-06-23 ENCOUNTER — Ambulatory Visit (HOSPITAL_COMMUNITY)
Admission: RE | Admit: 2010-06-23 | Discharge: 2010-06-23 | Disposition: A | Discharge status: Home / Self Care | Payer: MEDICARE | Source: Ambulatory Visit | Attending: Cardiology | Admitting: Cardiology

## 2010-06-23 ENCOUNTER — Ambulatory Visit (HOSPITAL_COMMUNITY): Payer: MEDICARE

## 2010-06-23 DIAGNOSIS — Z950 Presence of cardiac pacemaker: Secondary | ICD-10-CM | POA: Insufficient documentation

## 2010-06-23 DIAGNOSIS — I2510 Atherosclerotic heart disease of native coronary artery without angina pectoris: Secondary | ICD-10-CM | POA: Insufficient documentation

## 2010-06-23 DIAGNOSIS — I4891 Unspecified atrial fibrillation: Secondary | ICD-10-CM | POA: Insufficient documentation

## 2010-06-23 LAB — CBC
HCT: 37.5 % — ABNORMAL LOW (ref 39.0–52.0)
MCV: 91.5 fL (ref 78.0–100.0)
Platelets: 177 10*3/uL (ref 150–400)
RBC: 4.1 MIL/uL — ABNORMAL LOW (ref 4.22–5.81)
WBC: 5.7 10*3/uL (ref 4.0–10.5)

## 2010-06-23 LAB — BASIC METABOLIC PANEL
BUN: 22 mg/dL (ref 6–23)
Chloride: 104 mEq/L (ref 96–112)
Glucose, Bld: 129 mg/dL — ABNORMAL HIGH (ref 70–99)
Potassium: 4.7 mEq/L (ref 3.5–5.1)
Sodium: 138 mEq/L (ref 135–145)

## 2010-06-23 LAB — MAGNESIUM: Magnesium: 2.1 mg/dL (ref 1.5–2.5)

## 2010-06-24 NOTE — Procedures (Signed)
[facility]  Patient:    Alan [name], Alan [name]                        MRN: [id number] Adm. Date:  [id number] Attending:  Harriette Bouillon CC:         Hurshel Keys, M.D.             Jeanella Craze. Little, M.D.                           Procedure Report  PROCEDURE:  Colonoscopy.  INDICATIONS FOR PROCEDURE:  A 70 year old white male with change in bowel habits 3-4 months ago, experiencing 3-4 semi-formed stools daily.  Associated bright red blood per rectum, intermittent in nature, long standing but increased with more  recent change in stool.  No prior colorectal neoplasia surveillance.  Significant history of coronary artery disease with arrhythmia.  Maintained on Coumadin 5 mg q.d. in addition to sotalol.  The patient has withheld anticoagulation for the past week.  DESCRIPTION OF PROCEDURE:  After reviewing the nature of the procedure with the  patient including the increased risk of hemorrhage given the patients anticoagulation status, informed consent was signed.  The patient was premedicated, receiving IV sedation totalling Versed 6 mg and fentanyl 75 mcg administered in divided doses prior to the onset of the procedure.  Using the Olympus pediatric PCF-140L video colonoscope, the rectum was intubated after a normal digital examination revealing no evidence of hemorrhoids, perianal disease and with a normal, smooth prostate.  The scope was inserted and easily advanced around the entire length of the colon to the cecum, identified by the appendiceal orifice and ileocecal valve.  The patient was maintained in the left lateral decubitus position throughout.  The scope was slowly withdrawn with careful inspection of the entire colon in a  retrograde manner, including a retroflexed view of the rectal vault.  The only abnormality was at 18 cm.  Here, the mucosa had areas of what appeared to be resolving acute colitis characterized by patchy  erythema without friability r edema.  Again, this was not circumferential and did not extend beyond 1-2 cm in  length.  The mucosa was intact.  The appearance was that of a resolving inflammation.  There was also a small polyp at this level, approximately 4 mm in diameter.  It was not resected, given its diminutive size, in order to avoid keeping the patient off of anticoagulation for an additional two weeks.  I did ot feel that the risk of neoplasia warranted the risk of withholding further anticoagulation therapy.  Rather, we will recheck the polyp in three years at the time of repeat colonoscopy to assess enlargement.  No other abnormality was noted.  The colon was decompressed and the scope withdrawn.  The patient tolerated the procedure without difficulty, being maintained on Datascope monitor and low flow oxygen throughout.  He was returned to recovery in stable condition.  Time - 1, technical - 1, preparation - 1, total scope = 3.  ASSESSMENT: 1. Resolving colitis - well localized at 18 cm. 2. Diminutive polyp - 18 cm, not resected due to anticoagulation. 3. Hematochezia - possibly due to prior colitis.  This also could account for the    patients recent episode of diarrhea.  This all appears to be resolving    spontaneously.  No further evaluation presently warranted.  RECOMMENDATIONS: 1. Annual Hemoccults. 2. Repeat colonoscopy in  three years to reassess diminutive rectal polyp at 30 m. 3. Consider further evaluation if diarrhea/hematochezia persists - consider IBD    first profile, sigmoidoscopy with biopsy, empiric trial of Flagyl or Cipro,    empiric trial of Rowasa or Cortenema. DD:  06/14/99 TD:  06/15/99 Job: [id number]

## 2010-06-24 NOTE — Op Note (Signed)
NAMESOCRATES, CHAO NO.:  [id number]   MEDICAL RECORD NO.:  [id number]          PATIENT TYPE:  INP   LOCATION:  2899                         FACILITY:  [facility]   PHYSICIAN:  Kathalene Frames. Mayer Camel, M.D.   DATE OF BIRTH:  21-Dec-1940   DATE OF PROCEDURE:  12/28/2003  DATE OF DISCHARGE:                                 OPERATIVE REPORT   PREOPERATIVE DIAGNOSIS:  Osteoarthritis of the left knee with 10 degree  flexion contracture and 8 to 10 degree varus deformity.   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   PROCEDURE:  Left total knee arthroplasty using Osteonics Scorpio components,  9 femur, 11 tibia, 10 PS spacer, 9 modified medial patellar button, all  components cemented with Palacos cement.   SURGEON:  Kathalene Frames. Mayer Camel, M.D.   FIRST ASSISTANT:  Opal Sidles B. Mancel Bale, P.A.-C.   ANESTHESIA:  General endotracheal anesthesia.   ESTIMATED BLOOD LOSS:  Minimal.   FLUIDS REPLACED:  1200 mL crystalloid.   URINE OUTPUT:  None.   TOURNIQUET TIME:  1 hour and 30 minutes.   INDICATIONS FOR PROCEDURE:  The patient is a 70 year old man with a history  of right sided post polio syndrome and end stage arthritis of his left knee.  He has been followed for a number of years and has undergone conservative  treatment with anti-inflammatory medications, cortisone injections,  exercises and rest.  Unfortunately, he has failed the above, has bone on  bone arthritic changes on the x-ray and desires elective total knee  arthroplasty.  The risks and benefits of surgery were well understood by the  patient.   DESCRIPTION OF PROCEDURE:  The patient was identified by the arm band and  taken to the operating room at [facility] main hospital where the appropriate  anesthetic monitors were attached and general endotracheal anesthesia  induced with the patient in the supine position.  A tourniquet was applied  high to the left thigh, lateral post, a foot positioner applied to the table  and the left lower extremity prepped and draped in the usual sterile fashion  from the ankle to the tourniquet.  The limb was wrapped with an Esmarch  bandage and the tourniquet inflated to 350 mmHg with the knee flexed.  We  began the procedure by making an anterior longitudinal midline incision  about 18 cm in length centered over the patella.  Small bleeders in the skin  and subcutaneous tissue were identified and cauterized.  The transverse  patella retinaculum was reflected medially allowing for a medial  parapatellar arthrotomy.  The patella was everted and the free patellar fat  pad was resected.  The superficial medial collateral ligament was incised  from anterior to posterior leaving it intact distally on the tibia enhancing  our tibial exposure.  The patella was everted, the knee was hyperflexed and  peripheral osteophytes which were quite abundant were removed from the  medial side of the tibia and the femur as well as the lateral side of the  femur with osteotomes and rongeurs.  The ACL and the PCL were resected after  removing notch  osteophytes with a half inch wide osteotome.  Distally, we  placed the following the retractors with the knee hyperflexed; a posterior  medial B, a posterior McKay and a lateral Homan.  The proximal tibia was now  well exposed, measured with the Osteonics step drill followed by the IM rod  and a 0 degree posterior slope cutting guide.  Medially, the guide was set  to resect about 6 or 7 mm of bone and laterally where there was bone and  cartilage, the resection was more like 8 or 9 mm.  The cutting guide was  pinned into place and the proximal tibia resection accomplished.  The distal  femur was then entered with the Osteonics step drill with a 5 degree left  distal femoral cutting guide pinned into place with the rotation set along  the epicondylar axis.  The distal 10  mm of the femur were then resected with  a power saw.  We sized for a #9 femoral component and pinned the cutting  guide in place 3 degrees of external rotation. The anterior, posterior and  chamfer cuts were accomplished without difficulty followed by the Scorpio  box cut.  The root of the patella was grasped with a patellar cutting guide  and the posterior 9 to 10 mm of the patella resected and sized for a #9  modified medial patellar button and drilled.  We then assembled a 9 left  femoral trial, an 11 tibial base plate and a 10 spacer and performed a trial  reduction of going to full.  We were able to get a full extension. Flexion  was to 130 without lift-off and the patellar button tracked nicely.  At this  point, the trial components were removed and all bony surfaces were Water-  Pik cleaned and dried with suction and sponges.  A double batch of Palacos  polymethylmethacrylate cement with 1500 mg of Zinacef mixed in the powder  before the monomer was then mixed, applied to all bony metallic mating  surfaces except for the posterior condyles of the femur and then in order we  hammed into place a #11 tibial base plate after first making sure we had  dealt the Kiel punches appropriately placed and this was done prior to the  cement being applied.  After inserting the tibial component and excess  cement was removed. We then hammered the 9 left femoral component into place  and again removed excess cement.  The 9 modified medial patellar button was  then squeezed into place with a patellar clamp and excess cement removed.  While the cement was still soft, we went ahead and inserted the 10 PS spacer  and the knee was then hyperflexed and held in 5 degrees of flexion with  compression as the cement cured. After the cement had hardened, a set of  medium Hemovacs were placed deep in the wound.  Tracking of the patella was once again checked and a range of motion from 0 to 140 degrees noted.  The  parapatellar arthrotomy was then closed with #1 running Vicryl suture, the  subcutaneous tissue was closed with 0 and 2-0 undyed Vicryl and the skin  with skin staples. A dressing of Xeroform, 4 x 4 dressing changes, Webril  and an ace wrap were applied.  The tourniquet was let down.  The patient was  then awakened and taken to the  recovery room without difficulty.      Blair Hailey  D:  12/28/2003  T:  12/28/2003  Job:  [id number]

## 2010-06-24 NOTE — Cardiovascular Report (Signed)
NAME:  Alan [name], Alan [name]                           ACCOUNT NO.:  [id number]   MEDICAL RECORD NO.:  [id number]                   PATIENT TYPE:  OIB   LOCATION:  2857                                 FACILITY:  [facility]   PHYSICIAN:  Jeanella Craze. Little, M.D.              DATE OF BIRTH:  [birth year]   DATE OF PROCEDURE:  04/07/2002  DATE OF DISCHARGE:                              CARDIAC CATHETERIZATION   INDICATIONS FOR TEST:  The patient is a 70 year old male who has had single-  vessel bypass surgery in 1990 with an IMA sequential to his LAD diagonal. He  has had multiple percutaneous interventions (6 or 7) to his right coronary  artery since 1990.  Each time he presented with atrial fibrillation and had  inferior ischemia.   This time, he began having recurrent atrial fibrillation, a substantial drop  in his exercise tolerance and a vague, dull awareness of his left upper  chest.  He is brought in for outpatient cardiac catheterization.  His Coumadin was stopped three days prior to this and his INR today is 1.2.   The patient was prepped and draped in the usual sterile fashion exposing the  right groin.  Following local anesthetic with 1% Xylocaine, the Seldinger  technique was employed and a 5 [place] introducer sheath was placed into the  right femoral artery.  Left and right coronary arteriography, internal  mammary artery visualization, and ventriculography in the RAO projection was  performed.   EQUIPMENT:  A 5 French Judkins configuration catheters, and a 5 [place]  internal mammary artery catheter was used to cannulate the IMA.   COMPLICATIONS:  None.   RESULTS:  1. Hemodynamic monitoring:  Central aortic pressure 119/73, left ventricular     pressure 120/16.  The patient was in sinus rhythm with a ventricular rate     of the 70s.  2. Ventriculography:  Ventriculography in the RAO projection using 25 mL of     contrast at 12 mL/sec. revealed the apex to be hypokinetic.  The  ejection     fraction was 45-50%.  The end-diastolic pressure was 20.   CORONARY ARTERIOGRAPHY:  1. Left main:  Normal.  2. LAD:  The LAD was 100% occluded.  If filled via the internal mammary     artery graft.  3. Optional diagonal:  Small vessel.  4. Circumflex:  The circumflex in the AV groove was widely patent.  A very     large OM vessel had an area of 70% narrowing and a very large OM vessel.  5. Right coronary artery:  The right coronary artery was diffusely diseased     in the mid and proximal segments.  There was areas of 50-60%     irregularities and there was an area of about 75% in-stent re-stenosis in     the midportion. The PDA and posterolateral vessels, however, were well  preserved.  6. Internal mammary artery to the LAD:  The internal mammary artery was     widely patent.  The LAD and diagonal were both adequately visualized.   CONCLUSIONS:  1. Widely patent graft to the left anterior descending diagonal with high-     grade stenosis in the right coronary artery and obtuse marginal system.  2. Mild left ventricular dysfunction.   The patient wants definitive treatment for his atrial fibrillation.  Maze  procedure and re-CABG is his best option.  The lesion in his circumflex is  amenable to percutaneous intervention and his right coronary artery could be  dealt with, with in-stent angioplasty with a Cutting Balloon.  However, his  entire right coronary artery is significantly diseased, and with his  pressing desire to have definitive therapy for his atrial fibrillation, I  will refer to surgery for consideration of revascularization and maze  procedure.                                                  Jeanella Craze. Little, M.D.    ABL/MEDQ  D:  04/07/2002  T:  04/07/2002  Job:  [id number]   cc:   Cardiac Catheterization Lab   Darcus Austin, D.O.  [address], [place]  [place]  [place] [address]  Fax: [phone]

## 2010-06-24 NOTE — Op Note (Signed)
NAME:  Alan [name], Alan [name] NO.:  [id number]   MEDICAL RECORD NO.:  [id number]          PATIENT TYPE:  OIB   LOCATION:  2899                         FACILITY:  [facility]   PHYSICIAN:  Jeanella Craze. Little, M.D. DATE OF BIRTH:  [birth year]   DATE OF PROCEDURE:  06/09/2005  DATE OF DISCHARGE:  06/09/2005                                  PROCEDURE NOTE   CARDIAC CATHETERIZATION   INDICATIONS FOR TEST:  This 70 year old male had bypass surgery in 1990 that  included internal mammary artery sequentially to his diagonal and LAD.  In  2004 he had repeat bypass surgery that included a graft to a distal right.  At the same time he had a Mays procedure and this last procedure was all  performed at [facility] by Dr. Amador Cunas.   He is brought in for outpatient cardiac catheterization for reevaluation of  his chest discomfort.   PROCEDURE:  The patient was prepped and draped in the usual sterile fashion  exposing the right groin.  After local anesthetic with 1% Xylocaine the  Seldinger technique was employed and a 5-French introducer sheath was placed  in the right femoral artery.  Left and right coronary arteriography, graft  visualization, and ventriculography in the RAO projection was performed.   COMPLICATIONS:  None.   TOTAL CONTRAST:  125 mL.   EQUIPMENT:  5-French Judkins configuration diagnostic catheters and a  __________ catheter was used to cannulate the internal mammary artery.   RESULTS:  Hemodynamic monitoring:  Central aortic pressure was 129/63, left  ventricular pressure was 128/9.  No significant gradient was noted at time  of pullback.   Ventriculography:  Ventriculography performed at the end of the procedure  using 25 mL of contrast at 12 mL per second revealed the left ventricular  cavity to be slightly dilated.  Ejection fraction was 40-45%.  The end-  diastolic pressure was 22.  No significant mitral regurgitation was seen.   Coronary arteriography:  1.   Left main normal.  2.  LAD:  The LAD was 100% occluded proximally.  There were remnants of a      small septal perforator noted.  This was a grafted vessel.  3.  Circumflex:  The circumflex was a nongrafted vessel.  There was a very      large OM-1 that had an area of 40% narrowing in its mid portion.  The      ongoing circumflex after the first OM had some mild proximal      irregularities and the distal vessel had mild disease in the terminal      portion ranging from 40% to 60% but this was at the very terminal      portion of the vessel, not amenable to any type of intervention, and      extremely small in diameter.  4.  Questionable optional diagonal.  I could not tell whether this was a      true optional diagonal coming off the left main or whether it came off      exceptionally high off the circumflex.  This vessel had some      irregularities proximally but no high-grade stenoses.  5.  Right coronary artery 100% occluded proximally.   Grafts:  1.  The saphenous vein graft to the RCA:  The graft was widely patent.  It      inserted as a PDA and the PDA had three posterolateral vessels that were      free of disease.  The posterolateral vessels, however, are relatively      small in diameter.  2.  Internal mammary artery sequentially to the LAD diagonal:  The graft was      widely patent.  The first diagonal and the LAD were small and free of      disease.   CONCLUSION:  1.  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  2.  Patent graft to both the right coronary artery and the left anterior      descending artery diagonal system.  3.  Circumflex with only mild irregularities.  This is nongrafted vessel.   I cannot explain his chest discomfort based on his catheterization anatomy.  He will be discharged to home today, follow up in the office next week.           ______________________________  Jeanella Craze. Little, M.D.     ABL/MEDQ   D:  06/09/2005  T:  06/10/2005  Job:  [id number]   cc:   [facility]   Darcus Austin, D.O.  Fax: [phone]

## 2010-06-24 NOTE — Assessment & Plan Note (Signed)
[place]                             PULMONARY OFFICE NOTE   LEON, LARUE                        MRN:          [id number]  DATE:02/09/2006                            DOB:          20-May-1940    PROBLEM:  1. Sleep apnea (obstructive and central.)  2. Coronary disease/coronary artery bypass graft/paroxysmal atrial      fibrillation.  3. Diabetes.  4. History of polio.   HISTORY:  I had last seen this gentleman in 2004 for followup of sleep  apnea.  He has continued CPAP used at every night at 8 CWP.  He prefers  a cool humidifier, and actually puts ice in the tray.  Auto titration  was done recently recommending a pressure at 10 CWP.  His current  machine is worn out.  Otherwise, he is being considered for pacemaker  placement.  He had a recent hernia repair under general anesthesia with  no problems.   MEDICATIONS:  1. Metformin 100 mg.  2. Lisinopril/hydrochlorothiazide 20/25.  3. Fish oil.  4. Multivitamins.  5. Glipizide 5 mg.  6. Lexapro 10 mg.  7. Crestor 10 mg.  8. Aspirin 325 mg.  9. Flax seed 1000 mg.  10.Tramadol p.r.n.  11.Ambien p.r.n.   NO MEDICATION ALLERGY.   CPAP has been at 8.   OBJECTIVE:  Weight 242 pounds.  BP 168/70.  Pulse regular 71.  Room air  saturation 98%.  He is overweight, but alert and comfortable.  Palate is status post palatoplasty.  Speech is clear.  Nasal airway is  unobstructed.  There are no pressure marks on his face from the mask.  LUNG FIELDS:  Clear.  HEART:  Sounds regular without murmur.   IMPRESSION:  1. Obstructive and central sleep apnea with original diagnostic study      August 26, 1990 showing an index of 26 per hour.  He is status      palatoplasty.  Recently titrated continuous positive airway      pressure suggestive adjustment to 10 CWP.  2. Other medical problems as noted.   PLAN:  1. We discussed weight loss, his responsibility to drive safely,      available  treatments for CPAP and medical concerns.  2. Replacement CPAP machine at 10 CWP with cool humidifier.  3. Schedule return in 1 year, earlier p.r.n.     Clinton D. Annamaria Boots, MD, [place], [place]  Electronically Signed    CDY/MedQ  DD: 02/10/2006  DT: 02/10/2006  Job #: [id number]   cc:   Darcus Austin, D.O.  Jeanella Craze. Little, M.D.

## 2010-06-24 NOTE — Discharge Summary (Signed)
Alan [name], Alan [name] NO.:  [id number]   MEDICAL RECORD NO.:  [id number]          PATIENT TYPE:  OIB   LOCATION:  [id number]                         FACILITY:  [facility]   PHYSICIAN:  Octavia Heir, MD  DATE OF BIRTH:  Sep 24, 1940   DATE OF ADMISSION:  03/16/2006  DATE OF DISCHARGE:  03/17/2006                               DISCHARGE SUMMARY   DISCHARGE DIAGNOSES:  1. Sick sinus syndrome with pauses, status post elective Medtronic      pacemaker implant this admission.  2. Coronary disease, coronary artery bypass grafting in 1990 with redo      in 2004 with an MACE procedure by Dr. Amador Cunas in [place], [place].  3. History of atrial fibrillation.  4. Normal left ventricular function.  5. Non-insulin diabetes.  6. Treated hypertension.  7. Treated dyslipidemia.   HOSPITAL COURSE:  The patient is a 70 year old male followed by Dr. Rachel Moulds and Dr. Aldona Bar with a history of coronary disease.  He had  bypass surgery with redo by Dr. Amador Cunas as noted.  He has had atrial  fibrillation and had a MACE procedure.  He has had intermittent episodes  of junctional rhythm with rates in the 50s.  He has had fatigue and  lethargy.  An event monitor placed by Dr. Rex Kras in January showed sinus  bradycardia with pauses and some nonsustained ventricular tachycardia.  Please see Dr. Ileene Hutchinson admission note for complete details.  The  patient was seen by Dr. Tami Ribas in the office on March 12, 2006 and  set up for an elective dual-chamber pacer implant which was done  February 8, [id number] without complications.  The patient was discharged on  March 17, 2006.   DISCHARGE MEDICATIONS:  1. Lisinopril HCTZ 20/12.5 daily.  2. Metformin 500 mg twice a day.  3. Glipizide 5 mg a day.  4. Lexapro 10 mg a day.  5. Crestor 10 mg a day.  6. Aspirin 325 mg a day to start on March 18, 2006.  7. Multivitamin.  8. Coreg 3.125 mg twice daily was added.   LABORATORY  DATA:  EKG shows sinus rhythm.  Chest x-ray shows  cardiomegaly without pulmonary edema.   DISPOSITION:  The patient discharged in stable condition and will follow  up with Dr. Tami Ribas and Dr. Rex Kras.      Erlene Quan, P.A.      Octavia Heir, MD  Electronically Signed    LKK/MEDQ  D:  04/24/2006  T:  04/25/2006  Job:  [id number]

## 2010-06-24 NOTE — Op Note (Signed)
NAME:  Alan [name], Alan [name] NO.:  [id number]   MEDICAL RECORD NO.:  [id number]          PATIENT TYPE:  OIB   LOCATION:  [id number]                         FACILITY:  [facility]   PHYSICIAN:  Octavia Heir, MD  DATE OF BIRTH:  13-Aug-1940   DATE OF PROCEDURE:  03/16/2006  DATE OF DISCHARGE:                               OPERATIVE REPORT   PROCEDURES:  Insertion of a Medtronic EnRhythm generator, [id number],  serial [id number], with placement of active atrial and ventricular  leads.   ATTENDING SURGEON:  Octavia Heir, MD.   COMPLICATIONS:  None.   INDICATIONS:  Alan [name] is a 70 year old male patient of Dr. Rachel Moulds and Dr. Aldona Bar, with a history of CAD, status post bypass  surgery in 1990, with redo in '04, history of maze procedure at that  time with paroxysmal atrial fibrillation.  He has been unable to  tolerate negative chronotropic agents in the past secondary to  junctional rates in the 30s to 40s.  He is now referred for dual-chamber  pacer implant to allow for treatment of his CAD and PAF.   DESCRIPTION OF OPERATION:  After the patient gave informed written  consent, the patient was brought to the cardiac cath lab, where the left  chest was shaved, prepped and draped in sterile fashion.  ECG monitoring  was established.  Lidocaine 1% was then used to anesthetize the left  midsubclavicular area.  Next, approximately a 3-cm horizontal incision  was then carried out, and hemostasis was obtained with electrocautery.  Blunt dissection was used to carry this down to the left deltopectoral  fascia.  Next, approximately a 3 x 4-cm pocket was then created over the  left deltopectoral fascia, and, again, hemostasis was confirmed.  The  left subclavian vein was then easily entered x2 with passage of 2  retained guide wires.  Four silk sutures were then placed through the  bases of the retained guide wires.  Next, over the first retained guide  wire, a #7-French  dilator and sheath were then inserted and the dilator  and guide wire removed.  Through this, a 58-cm active Medtronic lead,  model #5076, serial [id number], was then passed into the right atrium  and the peel-away sheath was removed.  A second 7-French dilator and  sheath were then inserted over the second retained guide wire and the  guide wire and dilator were removed.  Through this, a 52-cm active  Medtronic lead, model #5076, serial [id number], was then placed into  the right atrium and the peel-away sheath was removed.  A J curve was  then placed in the ventricular stylet.  It should be noted that the  right atrium appeared to be extremely large and there appeared to be at  least a moderate amount of tricuspid regurgitation.  We did successfully  cross the tricuspid valve; however, it was difficult to place the lead  into the RV apex, as the amount of TR kept dislodging the lead.  Additionally, advancement of a straight guide wire into the lead was  virtually impossible due  to the size of the left atrium, which would  prolapse the lead back through the tricuspid valve into the IVC.  Ultimately, we were able to get a satisfactory position in the RV apex,  and were able to capture 2 V.  The screw was then extended.  Thresholds  were then determined.  R waves were measured at 5.8 mV.  Impedance was  730 ohms.  Threshold was 0.3 V at 0.5 msec.  Current was 0.5 mA, and 10  V was negative for diaphragmatic stimulation.  Next, the preformed J  stylet was then placed on the atrial lead and the right atrial lead was  then positioned in the area of the atrial appendage.  Once we were able  to capture 2 V, the screw was then extended and threshold determined.  P  waves were measured at 2.6 mV, impedance 649 ohms.  Threshold in the  atrium was 1 V at 0.5 msec.  Current was 2.0 mA.  Again, 10 V was  negative for diaphragmatic stimulation.  The leads were then secured to  the pectoral fascia  with 2 silk sutures per lead.  The pocket was then  copiously irrigated with 1% kanamycin solution, and again hemostasis was  confirmed.  The leads were then connected in serial fashion to a  Medtronic EnRhythm [id number] generator, serial # [id number].  Header  screws were tightened and pacing was confirmed.  A single silk suture  was then placed in the apex of the pocket.  The generator and leads were  then delivered into the pocket, and the header was secured to the silk  suture.  The subcutaneous layer was then closed using running 2-0  Vicryl.  The skin was then closed using running 4-0 Vicryl.  Steri-  Strips were applied.  The patient was transferred to the recovery room  in stable condition.   CONCLUSIONS:  Successful implant a Medtronic EnRhythm generator,  [id number], serial [id number], with active atrial and ventricular leads.      Octavia Heir, MD  Electronically Signed     RHM/MEDQ  D:  03/16/2006  T:  03/17/2006  Job:  [id number]   cc:   Darcus Austin, D.O.  Jeanella Craze. Little, M.D.

## 2010-06-24 NOTE — Op Note (Signed)
NAME:  Alan [name], Alan [name] NO.:  [id number]   MEDICAL RECORD NO.:  [id number]          PATIENT TYPE:  AMB   LOCATION:  DAY                          FACILITY:  [facility]   PHYSICIAN:  Timothy E. Rosana Hoes, M.D. DATE OF BIRTH:  Sep 23, 1940   DATE OF PROCEDURE:  04/17/2006  DATE OF DISCHARGE:  04/17/2006                               OPERATIVE REPORT   PREOPERATIVE DIAGNOSIS:  Painful right groin.   POSTOPERATIVE DIAGNOSIS:  Painful right groin.   PROCEDURE:  Exploration of right groin.   SURGEON:  Lennie Hummer, MD   ANESTHESIA:  General.   Alan [name] is a well-known entrusted patient, who has had several  procedures and most recently approximately 6 months ago, had a right  direct inguinal hernia repair with mesh.  He healed satisfactorily but  has had persistent dull, aching pain, specifically near and below the  right suprapubic tubercle of a pulling and gnawing sensation in nature.  This is worse with activities.  He does get rest with reclining.  His  risk factors include obesity and heavy strenuous work.  He has been  carefully followed and offered a number of options, and he wishes to  proceed with an exploration as has been carefully discussed with him.  The plan being to release the Prolene sutures attaching the mesh to the  fascia, and he understands that there is a significant chance that this  will not relieve his pain.  He is seen, identified, and the right groin  marked.   He is taken to the operating room, placed supine.  LMA anesthesia  provided and the right groin was clipped, prepped and draped in the  usual fashion.  Marcaine 0.25% with epinephrine was injected superficial  and deep in the right groin area.  The old incision was opened.  Even  the fatty tissue was quite scarred.  Bleeding was controlled with the  cautery, and the external oblique was identified, cleared off and opened  in line with its fibers through the external ring.  The cord was  reflected laterally.  There was no indirect hernia.  There was no  evidence of infection or problem with the mesh per se as it lay where it  was supposed to be.  I did locate all the Prolene sutures.  These were 2-  0 running Prolene applied from the pubic tubercle and superior and  laterally along the transversalis fascia medially and the ilioinguinal  ligament laterally.  They were simply cut to release any tension.  Finding no other problems or exploration for pain, the procedure was  complete.  The wound was dry.  It was irrigated, the external oblique  closed  with a 2-0 Vicryl and the skin closed with 3-0 Monocryl.  Steri-Strips  applied.  Final counts correct.  He tolerated it well, was extubated,  and taken to recovery room in good condition.   He will be seen and followed as an outpatient.  He has Vicodin for pain.  He will resume other medications.      Timothy E. Rosana Hoes, M.D.  Electronically  Signed     TED/MEDQ  D:  04/17/2006  T:  04/19/2006  Job:  [id number]   cc:   Jeanella Craze. Little, M.D.  Fax: [phone]

## 2010-06-24 NOTE — Op Note (Signed)
NAME:  Alan [name], Alan [name] NO.:  [id number]   MEDICAL RECORD NO.:  [id number]          PATIENT TYPE:  OIB   LOCATION:  [place]                         FACILITY:  [facility]   PHYSICIAN:  Timothy E. Rosana Hoes, M.D. DATE OF BIRTH:  04-12-1940   DATE OF PROCEDURE:  12/08/2005  DATE OF DISCHARGE:                                 OPERATIVE REPORT   PREOPERATIVE DIAGNOSIS:  Right inguinal hernia, recurrent; umbilical hernia,  primary.   POSTOPERATIVE DIAGNOSIS:  Right indirect inguinal hernia and umbilical  hernia.   PROCEDURE:  Repair of umbilical hernia with mesh; repair of right inguinal  hernia with mesh.   SURGEON:  Timothy E. Rosana Hoes, M.D.   ANESTHESIA:  General.   Alan [name] is a very pleasant happy and reasonably healthy 70 year old  Caucasian male.  He does have obesity, diabetes, heart disease, all under  reasonable control.  He is active.  He has recently had symptoms of a  recurrent right inguinal hernia, primary repair being remotely in the 1950s.  He also has a painful umbilical hernia.  Careful discussion in the office  about the options, the expectations and the risk factors, he wishes to  proceed.  He is here today.  He has been evaluated by his physicians and the  anesthesia staff, and we are ready to proceed.  The umbilical area and the  right groin are marked, and the permit is signed.   The patient was taken to the operating room, placed supine.  General  endotracheal anesthesia administered.  An appropriate area around the right  groin and umbilicus were clipped.  The entire abdomen was then prepped and  draped in usual fashion.  I did use Marcaine 0.25% with epinephrine prior to  each incision.  A supraumbilical horizontal incision made.  The hernia  identified, opened.  It contained preperitoneal fat.  Its margins were  defined.  The fat was reduced, and the defect was closed with running 0  Prolene.  A piece of Ultrapro Mesh was then cut in fashion to fit  well  across the defect and on all four sides.  This was sewn down with 2-0  Prolene.  Wound was closed in layers with Vicryl and wide skin staples.   Attention was turned right groin. Again, Marcaine with epinephrine was used.  A generous incision made in the right groin.  The abundant fatty tissue  dissected.  Retractors applied.  The external oblique fascia identified.  It  was scarred.  It was dissected and opened.  Beneath the external fascia was  also sticky with adhesions.  These were taken down sharply.  The cord  structures were easily identified and dissected from the floor of the canal.  There was no sac within the cord.  Dissecting back to the internal ring,  however, there was a large piece of preperitoneal fat protruding through the  internal ring.  Exploration of the remainder of the floor of canal was  without hernia findings.  A plug was fashioned and the Ultrapro placed into  the internal ring lateral to the cord exit  and sutured in place.  This also  reduced the fat protruding.  Then, a piece of Ultrapro Mesh was cut and  fashioned to fit the entire floor of the canal as well as up to and around  the internal ring.  This was sewn down with running 2-0 Prolene.  Cord  exited normally, and the repair was satisfactory.  With this, the counts  were correct, and closure was accomplished with Vicryl and wide skin  staples.  Final counts were correct.  He tolerated it well.  Dermabond and  dressing applied, and he was removed to the recovery room.   Because of obesity, sleep apnea, he will be observed overnight and followed  in the office.  Written and verbal instructions given as well as Percocet  for pain.      Timothy E. Rosana Hoes, M.D.  Electronically Signed     TED/MEDQ  D:  12/08/2005  T:  12/08/2005  Job:  [id number]   cc:   Jeanella Craze. Little, M.D.  Fax: [phone]

## 2010-06-24 NOTE — Discharge Summary (Signed)
[name], [name] NO.:  [id number]   MEDICAL RECORD NO.:  [id number]          PATIENT TYPE:  INP   LOCATION:  [id number]                         FACILITY:  [facility]   PHYSICIAN:  Kathalene Frames. Mayer Camel, M.D.   DATE OF BIRTH:  08-Nov-1940   DATE OF ADMISSION:  12/28/2003  DATE OF DISCHARGE:  12/30/2003                                 DISCHARGE SUMMARY   PRIMARY DISCHARGE DIAGNOSIS:  End-stage degenerative joint disease of the  left knee.   PROCEDURE IN HOSPITAL:  Left total knee arthroplasty.   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  He has been followed for a number of years and has undergone conservative  treatment with anti-inflammatory medications, cortisone injections,  exercise, and rest.  Unfortunately he has failed all of the above.  He has  bone-on-bone arthritic changes on the x-ray and desires elective total knee  arthroplasty after discussing risks versus benefits.   ALLERGIES:  The patient has no known drug allergies.   MEDICATIONS ON ADMISSION:  Avandia, Lexapro, Lotensin, Crestor, and aspirin.   PAST MEDICAL HISTORY:  1.  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  2.  Hypertension.  3.  Diabetes.  4.  Increased cholesterol.  5.  Post polio syndrome.   PAST SURGICAL HISTORY:  1.  CABG x2.  2.  Maze for his atrial fibrillation in April 2004.  3.  Right leg lengthening and left knee scope.   SOCIAL HISTORY:  No tobacco, no ethanol, no IV drug abuse.  He is married  and currently disabled.   FAMILY HISTORY:  Mother died at 62 with a history of cancer and diabetes.  Father died at 57 in a motor vehicle accident.   REVIEW OF SYSTEMS:  Positive for glasses, temporary dental work on his front  teeth.  Denies any shortness of breath, chest pain, or recent illness.   PHYSICAL EXAMINATION:  VITAL SIGNS:  Temperature 97.1, pulse 60,  respirations 16, blood pressure 130/78.  He is a 5 foot 10, 225 pound male.  His cardiologist is Dr. Aldona Bar.  HEENT:  Head is normocephalic, atraumatic.  Ears:  TMs are clear.  Eyes:  Pupils equal, round, and reactive to accommodation.  Nose is patent.  Throat  benign.  Positive temporary bridge work.  NECK:  Supple.  Full range of motion.  CHEST:  Clear to auscultation and percussion.  HEART:  Regular rate and rhythm with a split S1.  ABDOMEN:  Soft, nontender.  EXTREMITIES:  Show left knee in 5-10 flexion contracture with a 5 degree  varus deformity.  Pain dull, range of motion with crepitus and trace fusion.  SKIN:  Shows well-healed normal scar from previous surgery.   LABORATORY DATA:  X-rays show bone on bone with a medial compartment with  positive spurring.   Preoperative labs including CBC, CMET, chest x-ray, PT and PTT were all  within normal limits with the exception of glucose of 142.   HOSPITAL COURSE:  On the day of admission the patient was taken to the  operating room at [facility] where he underwent a left total knee  arthroplasty using _________ components, #9 femur, #11 tibia, 10 PS spacer,  #9 modified medial patellar button.  All components cemented with Palacos  cement.  Medium Hemovac was placed into the knee.  The patient was placed on  preoperative antibiotics.  He was placed on postoperative Coumadin  prophylaxis per pharmacy protocol.  Physical therapy was begun in the  recovery room with a CPM.  The patient was awake and alert and monitored for  pain, using CPM well.  No nausea or vomiting.  Vital signs were stable,  afebrile.  Dressing was clean and dry.  He was neurovascularly intact.  Drain output had gone from 250 cc to 50 and was discontinued without  difficulty.  Hemoglobin was 11.3, PT 14, CBG was 174, INR 1.2.  He was  otherwise stable and slowly improving.  Physical therapy was begun in  earnest.  On postoperative day #2 the patient was awake  and alert, walking  independently, but still needing some help with transfers and stairs.  Vital  signs were stable.  No signs of fever.  Hemoglobin 11, PT 16.  Dressing was  dry, wound was benign.  He was discharged home after physical therapy that  day after meeting his goals.   DISCHARGE MEDICATIONS:  Percocet and Coumadin.  He will have Coumadin per  pharmacy protocol followed by home health agency, which in his case was  [facility] for two weeks postoperatively.  Resume home medications.   FOLLOW UP:  Return to clinic with Dr. _________ in a week's time, sooner if  he is having increase in temperature, increase in pain, or drainage from the  wound.   FINAL DIAGNOSIS:  End-stage arthritis of the left knee.   PROCEDURE WHILE IN HOSPITAL:  Left total knee arthroplasty.   DIET:  Regular.   WOUND CARE:  Dressing change daily or q.o.d. depending on activity.   ACTIVITY:  Weight bearing as tolerated with walker and total hip precaution.  He will continue with CPM at home as well.      Alan [name]  D:  02/17/2004  T:  02/17/2004  Job:  [id number]

## 2010-07-21 ENCOUNTER — Emergency Department (HOSPITAL_COMMUNITY)
Admission: EM | Admit: 2010-07-21 | Discharge: 2010-07-22 | Disposition: A | Discharge status: Home / Self Care | Payer: Medicare Other | Attending: Emergency Medicine | Admitting: Emergency Medicine

## 2010-07-21 DIAGNOSIS — S0510XA Contusion of eyeball and orbital tissues, unspecified eye, initial encounter: Secondary | ICD-10-CM | POA: Insufficient documentation

## 2010-07-21 DIAGNOSIS — I10 Essential (primary) hypertension: Secondary | ICD-10-CM | POA: Insufficient documentation

## 2010-07-21 DIAGNOSIS — X58XXXA Exposure to other specified factors, initial encounter: Secondary | ICD-10-CM | POA: Insufficient documentation

## 2010-07-21 DIAGNOSIS — E119 Type 2 diabetes mellitus without complications: Secondary | ICD-10-CM | POA: Insufficient documentation

## 2010-07-21 DIAGNOSIS — I2510 Atherosclerotic heart disease of native coronary artery without angina pectoris: Secondary | ICD-10-CM | POA: Insufficient documentation

## 2010-07-23 NOTE — Cardiovascular Report (Signed)
  NAME:  Alan [name], Alan [name] NO.:  [id number]  MEDICAL RECORD NO.:  [id number]           PATIENT TYPE:  O  LOCATION:  [facility]                         FACILITY:  [facility]  PHYSICIAN:  Jeanella Craze. Little, M.D. DATE OF BIRTH:  1940/10/08  DATE OF PROCEDURE:  06/23/2010 DATE OF DISCHARGE:                           CARDIAC CATHETERIZATION   This 70 year old male has a history of PAF dating back to the mid 1990s. He underwent redo bypass surgery in 2004 that included a maze procedure at [facility].  He initially did well with the maze procedure, but then in 2008 developed bradycardia with a junctional rhythm and finally had a permanent pacemaker implanted on March 16, 2006.  He has done well, but came in for pacer evaluation on May 27, 2010, and was found to be in atrial fibrillation.  He was asymptomatic with this.  I will start him on Xarelto 20 mg once a day.  We had tried in the office to rapid atrial pace him out of trial flutter, but this was unsuccessful.  He was brought in today for elective outpatient cardioversion.  His potassium is 4.7.  Prior to any anesthesia, the decision was made to try to once again pace him out of this.  He was in rapid atrial flutter with an atrial rate of around 280.  Multiple attempts to atrial pace faster than 280 were undertaken, but continued to be unsuccessful.  Finally, we were able to convert him from atrial flutter to atrial fib and the atrial fibrillation broke into a sinus with a rate of 80.  He is currently maintaining a sinus rhythm.  His 12-lead shows A-paced and V-sensed.          ______________________________ Jeanella Craze Little, M.D.     ABL/MEDQ  D:  06/23/2010  T:  06/24/2010  Job:  [id number]  cc:   Dr. Veronia Beets  Electronically Signed by [name] M.D. on 07/23/2010 10:59:11 AM

## 2010-11-01 LAB — BASIC METABOLIC PANEL
BUN: 22
Chloride: 104
Potassium: 4.6
Sodium: 137

## 2010-11-01 LAB — CBC
HCT: 35.3 — ABNORMAL LOW
Hemoglobin: 12.1 — ABNORMAL LOW
MCV: 90.7
Platelets: 169
RBC: 3.89 — ABNORMAL LOW
WBC: 5.7

## 2010-11-03 DIAGNOSIS — E785 Hyperlipidemia, unspecified: Secondary | ICD-10-CM | POA: Insufficient documentation

## 2010-11-24 LAB — COMPREHENSIVE METABOLIC PANEL
Alkaline Phosphatase: 46
BUN: 21
CO2: 27
Chloride: 105
Creatinine, Ser: 1.25
GFR calc non Af Amer: 58 — ABNORMAL LOW
Glucose, Bld: 172 — ABNORMAL HIGH
Potassium: 4.7
Total Bilirubin: 0.6

## 2010-11-24 LAB — PROTIME-INR
INR: 1.1
INR: 1.2
Prothrombin Time: 14.5

## 2010-11-24 LAB — CBC
HCT: 38 — ABNORMAL LOW
HCT: 38.8 — ABNORMAL LOW
Hemoglobin: 13.2
MCHC: 34
MCHC: 34
MCV: 89.6
MCV: 90.4
Platelets: 173
Platelets: 176
RDW: 12.7
WBC: 6.2

## 2010-11-24 LAB — CK TOTAL AND CKMB (NOT AT ARMC)
CK, MB: 1.3
CK, MB: 1.4
Total CK: 88

## 2010-11-24 LAB — HEPARIN LEVEL (UNFRACTIONATED): Heparin Unfractionated: 0.41

## 2010-11-24 LAB — TROPONIN I: Troponin I: 0.02

## 2010-12-20 ENCOUNTER — Other Ambulatory Visit: Payer: Self-pay

## 2010-12-20 ENCOUNTER — Encounter: Payer: Self-pay | Admitting: Emergency Medicine

## 2010-12-20 ENCOUNTER — Emergency Department (HOSPITAL_COMMUNITY): Payer: Medicare Other

## 2010-12-20 ENCOUNTER — Inpatient Hospital Stay (HOSPITAL_COMMUNITY)
Admission: EM | Admit: 2010-12-20 | Discharge: 2010-12-21 | DRG: 287 | Disposition: A | Discharge status: Home / Self Care | Payer: Medicare Other | Attending: Cardiology | Admitting: Cardiology

## 2010-12-20 DIAGNOSIS — I2510 Atherosclerotic heart disease of native coronary artery without angina pectoris: Secondary | ICD-10-CM | POA: Diagnosis present

## 2010-12-20 DIAGNOSIS — B91 Sequelae of poliomyelitis: Secondary | ICD-10-CM

## 2010-12-20 DIAGNOSIS — I442 Atrioventricular block, complete: Secondary | ICD-10-CM

## 2010-12-20 DIAGNOSIS — I10 Essential (primary) hypertension: Secondary | ICD-10-CM

## 2010-12-20 DIAGNOSIS — I472 Ventricular tachycardia, unspecified: Secondary | ICD-10-CM | POA: Diagnosis present

## 2010-12-20 DIAGNOSIS — Z950 Presence of cardiac pacemaker: Secondary | ICD-10-CM

## 2010-12-20 DIAGNOSIS — E785 Hyperlipidemia, unspecified: Secondary | ICD-10-CM | POA: Diagnosis present

## 2010-12-20 DIAGNOSIS — Z951 Presence of aortocoronary bypass graft: Secondary | ICD-10-CM

## 2010-12-20 DIAGNOSIS — K2970 Gastritis, unspecified, without bleeding: Secondary | ICD-10-CM | POA: Diagnosis present

## 2010-12-20 DIAGNOSIS — I200 Unstable angina: Principal | ICD-10-CM | POA: Diagnosis present

## 2010-12-20 DIAGNOSIS — I495 Sick sinus syndrome: Secondary | ICD-10-CM | POA: Insufficient documentation

## 2010-12-20 DIAGNOSIS — I4729 Other ventricular tachycardia: Secondary | ICD-10-CM | POA: Diagnosis present

## 2010-12-20 HISTORY — DX: Unspecified atrial fibrillation: I48.91

## 2010-12-20 HISTORY — DX: Essential (primary) hypertension: I10

## 2010-12-20 HISTORY — DX: Atherosclerotic heart disease of native coronary artery without angina pectoris: I25.10

## 2010-12-20 HISTORY — DX: Atrioventricular block, complete: I44.2

## 2010-12-20 LAB — LIPID PANEL
Cholesterol: 134 mg/dL (ref 0–200)
LDL Cholesterol: 75 mg/dL (ref 0–99)
Total CHOL/HDL Ratio: 3.5 RATIO
Triglycerides: 104 mg/dL (ref <150)
VLDL: 21 mg/dL (ref 0–40)

## 2010-12-20 LAB — BASIC METABOLIC PANEL
BUN: 29 mg/dL — ABNORMAL HIGH (ref 6–23)
CO2: 22 mEq/L (ref 19–32)
Calcium: 9.9 mg/dL (ref 8.4–10.5)
GFR calc non Af Amer: 64 mL/min — ABNORMAL LOW (ref >90)
Glucose, Bld: 96 mg/dL (ref 70–99)
Sodium: 137 mEq/L (ref 135–145)

## 2010-12-20 LAB — COMPREHENSIVE METABOLIC PANEL
ALT: 20 U/L (ref 0–53)
Albumin: 3.7 g/dL (ref 3.5–5.2)
Alkaline Phosphatase: 57 U/L (ref 39–117)
BUN: 25 mg/dL — ABNORMAL HIGH (ref 6–23)
Chloride: 106 mEq/L (ref 96–112)
Glucose, Bld: 155 mg/dL — ABNORMAL HIGH (ref 70–99)
Potassium: 5.2 mEq/L — ABNORMAL HIGH (ref 3.5–5.1)
Sodium: 137 mEq/L (ref 135–145)
Total Bilirubin: 0.3 mg/dL (ref 0.3–1.2)
Total Protein: 7.7 g/dL (ref 6.0–8.3)

## 2010-12-20 LAB — DIFFERENTIAL
Basophils Relative: 1 % (ref 0–1)
Eosinophils Absolute: 0.2 10*3/uL (ref 0.0–0.7)
Eosinophils Relative: 2 % (ref 0–5)
Lymphs Abs: 1.7 10*3/uL (ref 0.7–4.0)
Monocytes Relative: 11 % (ref 3–12)

## 2010-12-20 LAB — MRSA PCR SCREENING: MRSA by PCR: NEGATIVE

## 2010-12-20 LAB — GLUCOSE, CAPILLARY: Glucose-Capillary: 249 mg/dL — ABNORMAL HIGH (ref 70–99)

## 2010-12-20 LAB — CARDIAC PANEL(CRET KIN+CKTOT+MB+TROPI)
Relative Index: INVALID (ref 0.0–2.5)
Relative Index: INVALID (ref 0.0–2.5)
Relative Index: INVALID (ref 0.0–2.5)
Total CK: 77 U/L (ref 7–232)
Troponin I: <0.3 ng/mL (ref <0.30)
Troponin I: <0.3 ng/mL (ref <0.30)
Troponin I: <0.3 ng/mL (ref <0.30)

## 2010-12-20 LAB — CBC
Hemoglobin: 12.7 g/dL — ABNORMAL LOW (ref 13.0–17.0)
MCH: 31.1 pg (ref 26.0–34.0)
MCHC: 33.7 g/dL (ref 30.0–36.0)
MCV: 92.4 fL (ref 78.0–100.0)
Platelets: 175 10*3/uL (ref 150–400)
RBC: 4.08 MIL/uL — ABNORMAL LOW (ref 4.22–5.81)

## 2010-12-20 LAB — MAGNESIUM: Magnesium: 1.9 mg/dL (ref 1.5–2.5)

## 2010-12-20 MED ORDER — PANTOPRAZOLE SODIUM 40 MG PO TBEC
40.0000 mg | DELAYED_RELEASE_TABLET | Freq: Two times a day (BID) | ORAL | Status: DC
  Administered 2010-12-21 (×2): 40 mg via ORAL
  Filled 2010-12-20 (×3): qty 1

## 2010-12-20 MED ORDER — SODIUM CHLORIDE 0.9 % IJ SOLN: 3.0000 mL | Freq: Two times a day (BID) | INTRAMUSCULAR | Status: DC

## 2010-12-20 MED ORDER — ROSUVASTATIN CALCIUM 10 MG PO TABS
10.0000 mg | ORAL_TABLET | Freq: Every day | ORAL | Status: DC
  Administered 2010-12-20: 10 mg via ORAL
  Filled 2010-12-20 (×2): qty 1

## 2010-12-20 MED ORDER — CARVEDILOL 25 MG PO TABS
25.0000 mg | ORAL_TABLET | Freq: Two times a day (BID) | ORAL | Status: DC
  Administered 2010-12-20 – 2010-12-21 (×2): 25 mg via ORAL
  Filled 2010-12-20 (×4): qty 1

## 2010-12-20 MED ORDER — HYDRALAZINE HCL 50 MG PO TABS
50.0000 mg | ORAL_TABLET | Freq: Once | ORAL | Status: DC
  Filled 2010-12-20 (×2): qty 1

## 2010-12-20 MED ORDER — DEXTROSE-NACL 5-0.9 % IV SOLN: INTRAVENOUS | Status: DC

## 2010-12-20 MED ORDER — ACETAMINOPHEN 325 MG PO TABS: 650.0000 mg | ORAL_TABLET | ORAL | Status: DC | PRN

## 2010-12-20 MED ORDER — ASPIRIN 81 MG PO CHEW
324.0000 mg | CHEWABLE_TABLET | ORAL | Status: AC
  Filled 2010-12-20: qty 3
  Filled 2010-12-20: qty 1

## 2010-12-20 MED ORDER — ROSUVASTATIN CALCIUM 10 MG PO TABS
10.0000 mg | ORAL_TABLET | Freq: Every day | ORAL | Status: DC
  Filled 2010-12-20 (×2): qty 1

## 2010-12-20 MED ORDER — POTASSIUM CHLORIDE 2 MEQ/ML IV SOLN
INTRAVENOUS | Status: DC
  Filled 2010-12-20 (×3): qty 1000

## 2010-12-20 MED ORDER — ALPRAZOLAM 0.25 MG PO TABS: 0.2500 mg | ORAL_TABLET | Freq: Two times a day (BID) | ORAL | Status: DC | PRN

## 2010-12-20 MED ORDER — GI COCKTAIL ~~LOC~~
30.0000 mL | Freq: Once | ORAL | Status: DC
  Filled 2010-12-20 (×2): qty 30

## 2010-12-20 MED ORDER — LISINOPRIL 20 MG PO TABS
20.0000 mg | ORAL_TABLET | Freq: Two times a day (BID) | ORAL | Status: DC
  Administered 2010-12-20 – 2010-12-21 (×2): 20 mg via ORAL
  Filled 2010-12-20 (×4): qty 1

## 2010-12-20 MED ORDER — SODIUM CHLORIDE 0.9 % IV SOLN
250.0000 mL | INTRAVENOUS | Status: DC
  Administered 2010-12-20: 250 mL via INTRAVENOUS

## 2010-12-20 MED ORDER — DIAZEPAM 5 MG PO TABS
5.0000 mg | ORAL_TABLET | ORAL | Status: AC
  Administered 2010-12-21: 5 mg via ORAL
  Filled 2010-12-20: qty 1

## 2010-12-20 MED ORDER — SODIUM CHLORIDE 0.9 % IV SOLN: 250.0000 mL | INTRAVENOUS | Status: DC

## 2010-12-20 MED ORDER — ASPIRIN 81 MG PO CHEW
324.0000 mg | CHEWABLE_TABLET | Freq: Once | ORAL | Status: AC
  Administered 2010-12-20: 324 mg via ORAL
  Filled 2010-12-20: qty 4

## 2010-12-20 MED ORDER — ONDANSETRON HCL 4 MG/2ML IJ SOLN: 4.0000 mg | Freq: Four times a day (QID) | INTRAMUSCULAR | Status: DC | PRN

## 2010-12-20 MED ORDER — SODIUM CHLORIDE 0.9 % IJ SOLN: 3.0000 mL | INTRAMUSCULAR | Status: DC | PRN

## 2010-12-20 MED ORDER — ZOLPIDEM TARTRATE 5 MG PO TABS: 10.0000 mg | ORAL_TABLET | Freq: Every evening | ORAL | Status: DC | PRN

## 2010-12-20 MED ORDER — ASPIRIN EC 81 MG PO TBEC
81.0000 mg | DELAYED_RELEASE_TABLET | Freq: Every day | ORAL | Status: DC
  Filled 2010-12-20: qty 1

## 2010-12-20 MED ORDER — INSULIN GLARGINE 100 UNIT/ML ~~LOC~~ SOLN
30.0000 [IU] | Freq: Two times a day (BID) | SUBCUTANEOUS | Status: DC
  Administered 2010-12-21: 30 [IU] via SUBCUTANEOUS
  Filled 2010-12-20: qty 3

## 2010-12-20 MED ORDER — ESCITALOPRAM OXALATE 10 MG PO TABS
10.0000 mg | ORAL_TABLET | Freq: Every day | ORAL | Status: DC
  Administered 2010-12-20 – 2010-12-21 (×2): 10 mg via ORAL
  Filled 2010-12-20 (×2): qty 1

## 2010-12-20 MED ORDER — NITROGLYCERIN 2 % TD OINT
1.0000 [in_us] | TOPICAL_OINTMENT | Freq: Four times a day (QID) | TRANSDERMAL | Status: DC
  Administered 2010-12-20 – 2010-12-21 (×3): 1 [in_us] via TOPICAL
  Filled 2010-12-20: qty 1
  Filled 2010-12-20: qty 30

## 2010-12-20 MED ORDER — ASPIRIN 300 MG RE SUPP: 300.0000 mg | RECTAL | Status: AC

## 2010-12-20 MED ORDER — HYDRALAZINE HCL 50 MG PO TABS
50.0000 mg | ORAL_TABLET | Freq: Two times a day (BID) | ORAL | Status: DC
  Administered 2010-12-20 – 2010-12-21 (×2): 50 mg via ORAL
  Filled 2010-12-20 (×4): qty 1

## 2010-12-20 MED ORDER — LISINOPRIL 20 MG PO TABS
20.0000 mg | ORAL_TABLET | Freq: Once | ORAL | Status: DC
  Filled 2010-12-20 (×2): qty 1

## 2010-12-20 MED ORDER — ASPIRIN 81 MG PO CHEW: 324.0000 mg | CHEWABLE_TABLET | ORAL | Status: DC

## 2010-12-20 MED ORDER — NITROGLYCERIN 0.4 MG SL SUBL: 0.4000 mg | SUBLINGUAL_TABLET | SUBLINGUAL | Status: DC | PRN

## 2010-12-20 MED ORDER — INSULIN ASPART 100 UNIT/ML ~~LOC~~ SOLN
0.0000 [IU] | SUBCUTANEOUS | Status: DC
  Administered 2010-12-20 – 2010-12-21 (×2): 1 [IU] via SUBCUTANEOUS
  Administered 2010-12-21: 2 [IU] via SUBCUTANEOUS
  Administered 2010-12-21: 1 [IU] via SUBCUTANEOUS
  Filled 2010-12-20 (×2): qty 3

## 2010-12-20 MED ORDER — ESCITALOPRAM OXALATE 10 MG PO TABS
10.0000 mg | ORAL_TABLET | Freq: Once | ORAL | Status: DC
  Filled 2010-12-20: qty 1

## 2010-12-20 MED ORDER — CARVEDILOL 25 MG PO TABS
25.0000 mg | ORAL_TABLET | Freq: Once | ORAL | Status: DC
  Filled 2010-12-20: qty 1

## 2010-12-20 MED ORDER — ASPIRIN 81 MG PO CHEW
324.0000 mg | CHEWABLE_TABLET | ORAL | Status: AC
  Administered 2010-12-21: 324 mg via ORAL

## 2010-12-20 NOTE — ED Notes (Signed)
Called admit Nurse for report assisting another patient will back for report

## 2010-12-20 NOTE — ED Notes (Signed)
Pt resting no needs at this time.

## 2010-12-20 NOTE — ED Provider Notes (Signed)
Medical screening examination/treatment/procedure(s) were performed by non-physician practitioner and as supervising physician I was immediately available for consultation/collaboration.   Hoy Morn, MD 12/20/10 [phone]

## 2010-12-20 NOTE — ED Notes (Signed)
Bed assignment changed to stepdown, pt aware. Dr little or cards paged so that pt will know reason and about cath

## 2010-12-20 NOTE — ED Notes (Signed)
Attempted to call report to [phone], was told RN would call back in 10 min

## 2010-12-20 NOTE — ED Notes (Signed)
Report received.  Pt is resting with family at bedside.  Pt awaiting bed placement to step-down unit.  Pt is pain-free at present, reports midsternal pain that woke him this morning.  Pt reports it felt like gas, was belching and passing gas.  Pt reports pain resolved on it's own.  Pt reports last week, he was having "problems with my pacer",  Had pacer interogated via phone and was told to follow up at Cards office yesterday due to "7 second problem with my heart".  Medication was adjusted and pt sent home.  Pt denies that pain radiated anywhere, denies any shortness of breath.  Pt reports one episode of nausea while here in ED, but denies any at present.

## 2010-12-20 NOTE — ED Notes (Signed)
Family at bedside. 

## 2010-12-20 NOTE — ED Notes (Signed)
Patient on stretcher, NAD noted, Resp e/u, ABC intact. Updated on POC, Will continue to monitor. Lenae stone with cardiology at bedside,

## 2010-12-20 NOTE — ED Notes (Signed)
Cardiology at bedside.

## 2010-12-20 NOTE — H&P (Signed)
Alan [name] is an 70 y.o. male.   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 He was seen in the office yesterday by Dr. Rex Kras after noting runs of VT on pacer interrogation strips. He was asymptomatic with these episodes> he has a history of SOB of unknown etiology but that seems to also be at baseline. Yesterday he took his Metformin but did not eat very much and subsequently developed gas, belching and diarrhea. He was able to sleep but awoke at 1:00am with severe anterior chest pressure. He walked around the house and outside without any improvement. He c/o nausea but no vomiting. He called Dr Rex Kras at home and he recommended he come to the ED for evaluation. EKG reveals a paced rhythm, troponin neg. CP abated after arrival to the ED without intervention but he continues to have belching and gas.   Past Medical History  Diagnosis Date  . Diabetes mellitus   . Hypertension   . Coronary artery disease   . A-fib   . Complete heart block     Past Surgical History  Procedure Date  . Coronary artery bypass graft 1990  . Stents   . Coronary artery bypass graft 2004  . Maze procedure 2004  . Pacemaker insertion     No family history on file. Social History:  reports that he has never smoked. He does not have any smokeless tobacco history on file. He reports that he does not drink alcohol or use illicit drugs.  Allergies: No Known Allergies  Medications Prior to Admission  Medication Dose Route Frequency Provider Last Rate Last Dose  . aspirin chewable tablet 324 mg  324 mg Oral Once Faustino Congress, [place]   324 mg at 12/20/10 [id number]   No current outpatient prescriptions on file as of 12/20/2010.    Results for orders placed during the hospital encounter of 12/20/10 (from the past 48 hour(s))  TROPONIN I     Status: Normal   Collection Time   12/20/10   4:00 AM      Component Value Range Comment   Troponin I <0.30  <0.30 (ng/mL)   BASIC METABOLIC PANEL     Status: Abnormal   Collection Time   12/20/10  4:00 AM      Component Value Range Comment   Sodium 137  135 - 145 (mEq/L)    Potassium 4.6  3.5 - 5.1 (mEq/L)    Chloride 103  96 - 112 (mEq/L)    CO2 22  19 - 32 (mEq/L)    Glucose, Bld 96  70 - 99 (mg/dL)    BUN 29 (*) 6 - 23 (mg/dL)    Creatinine, Ser 1.15  0.50 - 1.35 (mg/dL)    Calcium 9.9  8.4 - 10.5 (mg/dL)    GFR calc non Af Amer 64 (*) >90 (mL/min)    GFR calc Af Amer 74 (*) >90 (mL/min)   CBC     Status: Abnormal   Collection Time   12/20/10  4:00 AM      Component Value Range Comment   WBC 7.3  4.0 - 10.5 (K/uL)    RBC 4.08 (*) 4.22 - 5.81 (MIL/uL)    Hemoglobin 12.7 (*) 13.0 - 17.0 (g/dL)    HCT 37.7 (*) 39.0 - 52.0 (%)    MCV 92.4  78.0 -  100.0 (fL)    MCH 31.1  26.0 - 34.0 (pg)    MCHC 33.7  30.0 - 36.0 (g/dL)    RDW 12.8  11.5 - 15.5 (%)    Platelets 175  150 - 400 (K/uL)   DIFFERENTIAL     Status: Normal   Collection Time   12/20/10  4:00 AM      Component Value Range Comment   Neutrophils Relative 64  43 - 77 (%)    Neutro Abs 4.6  1.7 - 7.7 (K/uL)    Lymphocytes Relative 23  12 - 46 (%)    Lymphs Abs 1.7  0.7 - 4.0 (K/uL)    Monocytes Relative 11  3 - 12 (%)    Monocytes Absolute 0.8  0.1 - 1.0 (K/uL)    Eosinophils Relative 2  0 - 5 (%)    Eosinophils Absolute 0.2  0.0 - 0.7 (K/uL)    Basophils Relative 1  0 - 1 (%)    Basophils Absolute 0.0  0.0 - 0.1 (K/uL)   PROTIME-INR     Status: Normal   Collection Time   12/20/10  4:00 AM      Component Value Range Comment   Prothrombin Time 14.8  11.6 - 15.2 (seconds)    INR 1.14  0.00 - 1.49    TROPONIN I     Status: Normal   Collection Time   12/20/10  6:42 AM      Component Value Range Comment   Troponin I <0.30  <0.30 (ng/mL)    Dg Chest 2 View  12/20/2010  *RADIOLOGY REPORT*  Clinical Data: Chest pain, shortness of breath, cough and weakness.   CHEST - 2 VIEW  Comparison: Chest radiograph performed 06/23/2010  Findings: The lungs are well-aerated.  Mild vascular congestion is noted, without evidence of pulmonary edema.  Mild bibasilar atelectasis is seen.  There is no evidence of pleural effusion or pneumothorax.  The heart is mildly enlarged; the patient is status post median sternotomy.  A pacemaker is noted at the left chest wall, with leads ending at the right atrium and right ventricle.  No acute osseous abnormalities are seen.  Mild degenerative change is noted along the lower thoracic spine.  IMPRESSION: Mild vascular congestion and mild cardiomegaly noted; mild bibasilar atelectasis seen.  Original Report Authenticated By: Santa Lighter, M.D.    Review of Systems  Constitutional: Negative for fever and chills.  HENT: Positive for ear pain and congestion.   Eyes: Negative.   Respiratory: Positive for shortness of breath.   Cardiovascular: Positive for chest pain and leg swelling.  Gastrointestinal: Positive for nausea, abdominal pain and diarrhea.       Abdominal Fullness Gas  Belching No waterbrash  Genitourinary: Negative.   Musculoskeletal: Positive for myalgias and joint pain.       Polio  Skin: Negative.   Neurological: Negative.   Endo/Heme/Allergies: Bruises/bleeds easily.       Blood thinner  Psychiatric/Behavioral: Negative.     Blood pressure 135/67, pulse 59, temperature 97.7 F (36.5 C), temperature source Oral, resp. rate 18, SpO2 97.00%. Physical Exam  Constitutional: He is oriented to person, place, and time. He appears well-developed and well-nourished. He is cooperative. No distress.  HENT:  Head: Normocephalic and atraumatic.  Eyes: Conjunctivae and EOM are normal. Pupils are equal, round, and reactive to light. No scleral icterus.  Neck: Normal range of motion. Neck supple. No thyromegaly present.  Cardiovascular: Normal rate, regular rhythm, S1 normal, S2  normal and intact distal pulses.     Respiratory: Effort normal and breath sounds normal. He exhibits no tenderness.  GI: Soft. Bowel sounds are normal. He exhibits no distension and no mass. There is no tenderness. There is no guarding.  Musculoskeletal:       Right hip: He exhibits decreased range of motion.       Left hip: Normal.       Right knee: He exhibits decreased range of motion and deformity.       Left knee: Normal.       Right ankle: He exhibits decreased range of motion and deformity.       Left ankle: Normal.  Lymphadenopathy:    He has no cervical adenopathy.  Neurological: He is alert and oriented to person, place, and time. No cranial nerve deficit.  Skin: Skin is warm and dry.  Psychiatric: He has a normal mood and affect. His behavior is normal. Judgment and thought content normal.     Assessment/Plan 1)Chest Pain/ [place] 2)Known CAD- CABG, Redo 3)NSVT noted on pacer interrogation 4)Gastritis 5)HTN 6)Dyslipidemia 7)PPM 8)H/O CHB 9) Afib- On xarelto  Keep NPO. Admit to tele and plan for radial cath today. If we are unable to do the radial cath today then we will stop his anticoagulation and plan for op cath later this week. Will continue  his bblocker and statin, start ntg paste. Continue to cycle cardiac enzymes, check tsh and hgba1c. Will stop his xarelto.  STONE,KATHERINE L 12/20/2010, 7:46 AM   Pt was seen and evaluated. Will need cath ? Radial due to [employer], ALFRED B 12/20/2010

## 2010-12-20 NOTE — ED Notes (Signed)
PT. REPORTS SUBSTERNAL CHEST PAIN ONSET THIS EVENING , SLIGHT SOB  , DENIES COUGH,  SLIGHT NAUSEA, NO DIAPHORESIS.  NO MEDS PRIOR TO ARRIVAL.

## 2010-12-20 NOTE — ED Notes (Signed)
Patient is resting comfortably. 

## 2010-12-20 NOTE — ED Provider Notes (Signed)
History     CSN: [id number] Arrival date & time: 12/20/2010  5:47 AM   First MD Initiated Contact with Patient 12/20/10 0602      Chief Complaint  Patient presents with  . Chest Pain    (Consider location/radiation/quality/duration/timing/severity/associated sxs/prior treatment) HPI Comments: Pt awoke from sleep at approx 0300 with chest pressure that did not resolve. No diaphoresis or palpitations.  He called his cardiologist who suggested he go to hospital. Associated with abdominal pain, diarrhea, and bloating. Pressure was similar to previous chest pain. Also c/o several days of URI symptoms. No ASA or NTG PTA. Pt is on xarelto 2/2 h/o afib.   Patient is a 70 y.o. male presenting with chest pain. The history is provided by the patient and medical records.  Chest Pain The chest pain began 3 - 5 hours ago. The chest pain is improving. The quality of the pain is described as pressure-like. Pertinent negatives for primary symptoms include no fever, no shortness of breath, no cough, no palpitations, no abdominal pain, no nausea and no vomiting. He tried nothing for the symptoms.  His past medical history is significant for CAD and pacemaker.  Pertinent negatives for past medical history include no MI.  Procedure history is positive for cardiac catheterization.     Past Medical History  Diagnosis Date  . Diabetes mellitus   . Hypertension   . Coronary artery disease     Past Surgical History  Procedure Date  . Coronary artery bypass graft   . Stents     No family history on file.  History  Substance Use Topics  . Smoking status: Never Smoker   . Smokeless tobacco: Not on file  . Alcohol Use: No      Review of Systems  Constitutional: Negative for fever and chills.  HENT: Negative for neck pain.   Eyes: Negative for redness.  Respiratory: Negative for cough and shortness of breath.   Cardiovascular: Positive for chest pain. Negative for palpitations.    Gastrointestinal: Negative for nausea, vomiting and abdominal pain.  Genitourinary: Negative for dysuria.  Musculoskeletal: Negative for back pain.  Skin: Negative for rash.  Neurological: Negative for syncope and light-headedness.    Allergies  Review of patient's allergies indicates no known allergies.  Home Medications  No current outpatient prescriptions on file.  BP 163/71  Pulse 60  Temp(Src) 97.7 F (36.5 C) (Oral)  Resp 18  SpO2 98%  Physical Exam  Nursing note and vitals reviewed. Constitutional: He is oriented to person, place, and time. He appears well-developed and well-nourished.  HENT:  Head: Normocephalic and atraumatic.  Eyes: Conjunctivae are normal. Pupils are equal, round, and reactive to light.  Neck: Normal range of motion. Neck supple. No JVD present.  Cardiovascular: Normal rate, regular rhythm, normal heart sounds and intact distal pulses.  Exam reveals no gallop and no friction rub.   No murmur heard. Pulmonary/Chest: Effort normal and breath sounds normal. He has no wheezes. He has no rales. He exhibits no tenderness.       Healed surgical scar from previous CABG  Abdominal: Soft. Bowel sounds are normal. There is tenderness. There is no rebound and no guarding.  Musculoskeletal: He exhibits no edema.  Neurological: He is alert and oriented to person, place, and time.  Skin: Skin is warm and dry.  Psychiatric: He has a normal mood and affect.    ED Course  Procedures (including critical care time)  Labs Reviewed  BASIC METABOLIC PANEL -  Abnormal; Notable for the following:    BUN 29 (*)    GFR calc non Af Amer 64 (*)    GFR calc Af Amer 74 (*)    All other components within normal limits  CBC - Abnormal; Notable for the following:    RBC 4.08 (*)    Hemoglobin 12.7 (*)    HCT 37.7 (*)    All other components within normal limits  TROPONIN I  DIFFERENTIAL  PROTIME-INR   Dg Chest 2 View  12/20/2010  *RADIOLOGY REPORT*  Clinical Data:  Chest pain, shortness of breath, cough and weakness.  CHEST - 2 VIEW  Comparison: Chest radiograph performed 06/23/2010  Findings: The lungs are well-aerated.  Mild vascular congestion is noted, without evidence of pulmonary edema.  Mild bibasilar atelectasis is seen.  There is no evidence of pleural effusion or pneumothorax.  The heart is mildly enlarged; the patient is status post median sternotomy.  A pacemaker is noted at the left chest wall, with leads ending at the right atrium and right ventricle.  No acute osseous abnormalities are seen.  Mild degenerative change is noted along the lower thoracic spine.  IMPRESSION: Mild vascular congestion and mild cardiomegaly noted; mild bibasilar atelectasis seen.  Original Report Authenticated By: Santa Lighter, M.D.     No diagnosis found.  6:05 AM H/o CABG, pacemaker insertion, afib, Dr. Rex Kras is cardiologist.  Last cath 2011, relatively benign.   6:17 AM Patient seen and examined. ASA ordered.    Date: 12/20/2010  Rate: 61   Rhythm: normal sinus rhythm  QRS Axis: normal  Intervals: normal  ST/T Wave abnormalities: nonspecific T wave changes  Conduction Disutrbances:none  Narrative Interpretation:   Old EKG Reviewed: changes noted Previous inverted t-waves, V2-V4, now upright   6:37 AM Spoke with on-call for Dr. Rex Kras who will see in ED.    MDM  Chest pain, cardiology to see pt.         [place], [place] 12/20/10 [phone]

## 2010-12-21 ENCOUNTER — Encounter (HOSPITAL_COMMUNITY): Payer: Self-pay

## 2010-12-21 ENCOUNTER — Encounter (HOSPITAL_COMMUNITY)
Admission: EM | Disposition: A | Discharge status: Home / Self Care | Payer: Self-pay | Source: Home / Self Care | Attending: Cardiology

## 2010-12-21 DIAGNOSIS — I10 Essential (primary) hypertension: Secondary | ICD-10-CM

## 2010-12-21 DIAGNOSIS — I4729 Other ventricular tachycardia: Secondary | ICD-10-CM | POA: Insufficient documentation

## 2010-12-21 DIAGNOSIS — I472 Ventricular tachycardia: Secondary | ICD-10-CM | POA: Insufficient documentation

## 2010-12-21 DIAGNOSIS — Z950 Presence of cardiac pacemaker: Secondary | ICD-10-CM

## 2010-12-21 DIAGNOSIS — I200 Unstable angina: Secondary | ICD-10-CM | POA: Diagnosis present

## 2010-12-21 HISTORY — PX: LEFT HEART CATHETERIZATION WITH CORONARY/GRAFT ANGIOGRAM: SHX5450

## 2010-12-21 LAB — GLUCOSE, CAPILLARY
Glucose-Capillary: 168 mg/dL — ABNORMAL HIGH (ref 70–99)
Glucose-Capillary: 121 mg/dL — ABNORMAL HIGH (ref 70–99)

## 2010-12-21 SURGERY — LEFT HEART CATHETERIZATION WITH CORONARY ANGIOGRAM
Anesthesia: LOCAL

## 2010-12-21 SURGERY — LEFT HEART CATHETERIZATION WITH CORONARY/GRAFT ANGIOGRAM

## 2010-12-21 MED ORDER — NITROGLYCERIN 0.4 MG SL SUBL: 0.4000 mg | SUBLINGUAL_TABLET | SUBLINGUAL | Status: DC | PRN

## 2010-12-21 MED ORDER — VERAPAMIL HCL 2.5 MG/ML IV SOLN
INTRAVENOUS | Status: AC
  Filled 2010-12-21: qty 2

## 2010-12-21 MED ORDER — NITROGLYCERIN 0.2 MG/ML ON CALL CATH LAB
INTRAVENOUS | Status: AC
  Filled 2010-12-21: qty 1

## 2010-12-21 MED ORDER — ISOSORBIDE MONONITRATE ER 30 MG PO TB24: 30.0000 mg | ORAL_TABLET | Freq: Every day | ORAL | Status: DC

## 2010-12-21 MED ORDER — ESCITALOPRAM OXALATE 10 MG PO TABS: 10.0000 mg | ORAL_TABLET | Freq: Every day | ORAL | Status: AC

## 2010-12-21 MED ORDER — RIVAROXABAN 10 MG PO TABS
10.0000 mg | ORAL_TABLET | Freq: Every day | ORAL | Status: DC
  Filled 2010-12-21: qty 1

## 2010-12-21 MED ORDER — PANTOPRAZOLE SODIUM 40 MG PO TBEC: 40.0000 mg | DELAYED_RELEASE_TABLET | Freq: Two times a day (BID) | ORAL | Status: DC

## 2010-12-21 MED ORDER — SODIUM CHLORIDE 0.9 % IV SOLN
1.0000 mL/kg/h | INTRAVENOUS | Status: AC
  Administered 2010-12-21: 1 mL/kg/h via INTRAVENOUS

## 2010-12-21 MED ORDER — INSULIN GLARGINE 100 UNIT/ML ~~LOC~~ SOLN: 30.0000 [IU] | Freq: Every day | SUBCUTANEOUS | Status: DC

## 2010-12-21 MED ORDER — MIDAZOLAM HCL 2 MG/2ML IJ SOLN
INTRAMUSCULAR | Status: AC
  Filled 2010-12-21: qty 2

## 2010-12-21 MED ORDER — METFORMIN HCL 500 MG PO TABS: 500.0000 mg | ORAL_TABLET | Freq: Two times a day (BID) | ORAL | Status: DC

## 2010-12-21 MED ORDER — LIDOCAINE HCL (PF) 1 % IJ SOLN
INTRAMUSCULAR | Status: AC
  Filled 2010-12-21: qty 30

## 2010-12-21 MED ORDER — HEPARIN SODIUM (PORCINE) 1000 UNIT/ML IJ SOLN
INTRAMUSCULAR | Status: AC
  Filled 2010-12-21: qty 1

## 2010-12-21 MED ORDER — ASPIRIN 81 MG PO TBEC: 81.0000 mg | DELAYED_RELEASE_TABLET | Freq: Every day | ORAL | Status: AC

## 2010-12-21 MED ORDER — SODIUM CHLORIDE 0.9 % IJ SOLN: 3.0000 mL | Freq: Two times a day (BID) | INTRAMUSCULAR | Status: DC

## 2010-12-21 MED ORDER — ACETAMINOPHEN 325 MG PO TABS: 650.0000 mg | ORAL_TABLET | ORAL | Status: AC | PRN

## 2010-12-21 MED ORDER — ACETAMINOPHEN 325 MG PO TABS: 650.0000 mg | ORAL_TABLET | ORAL | Status: DC | PRN

## 2010-12-21 MED ORDER — HEPARIN (PORCINE) IN NACL 2-0.9 UNIT/ML-% IJ SOLN
INTRAMUSCULAR | Status: AC
  Filled 2010-12-21: qty 1000

## 2010-12-21 MED ORDER — HYDRALAZINE HCL 50 MG PO TABS: 50.0000 mg | ORAL_TABLET | Freq: Two times a day (BID) | ORAL | Status: DC

## 2010-12-21 MED ORDER — SODIUM CHLORIDE 0.9 % IJ SOLN: 3.0000 mL | INTRAMUSCULAR | Status: DC | PRN

## 2010-12-21 MED ORDER — FENTANYL CITRATE 0.05 MG/ML IJ SOLN
INTRAMUSCULAR | Status: AC
  Filled 2010-12-21: qty 2

## 2010-12-21 MED ORDER — ONDANSETRON HCL 4 MG/2ML IJ SOLN: 4.0000 mg | Freq: Four times a day (QID) | INTRAMUSCULAR | Status: DC | PRN

## 2010-12-21 MED ORDER — ROSUVASTATIN CALCIUM 10 MG PO TABS: 10.0000 mg | ORAL_TABLET | Freq: Every day | ORAL | Status: DC

## 2010-12-21 MED ORDER — ISOSORBIDE MONONITRATE ER 30 MG PO TB24
30.0000 mg | ORAL_TABLET | Freq: Every day | ORAL | Status: DC
  Filled 2010-12-21: qty 1

## 2010-12-21 MED ORDER — MORPHINE SULFATE 2 MG/ML IJ SOLN: 1.0000 mg | INTRAMUSCULAR | Status: DC | PRN

## 2010-12-21 NOTE — Discharge Summary (Signed)
Physician Discharge Summary  Patient ID: Alan [name] MRN: [id number] DOB/AGE: 1941/02/01 70 y.o.  Admit date: 12/20/2010 Discharge date: 12/21/2010  Admission Diagnoses:  Discharge Diagnoses:  Principal Problem:  *Unstable angina pectoris Active Problems:  Non-sustained ventricular tachycardia  Hypertension  Cardiac pacemaker in situ   Discharged Condition: stable  Hospital Course: 70 y/o pt of Dr Rex Kras with history of CABG with redo 2004. Admitted with [place] and NSVT. Pt r/o for MI. Cath by Dr Ellyn Hack showed patent LIMA-Dx and LAD and patent SVG to RCA. Pt has know subtotal occlusion of PDA after the graft. EF 50%. He was hypertensive and medications were adjusted. He is on Xarelto for PAF and this can resumed this evening. Dr Ellyn Hack feels he can be d/c today.  Consults: none  Significant Diagnostic Studies: angiography: cardiac cath  Treatments:   Discharge Exam: Blood pressure 161/58, pulse 62, temperature 98.2 F (36.8 C), temperature source Oral, resp. rate 12, height 5\' 10"  (1.778 m), weight 110.7 kg (244 lb 0.8 oz), SpO2 98.00%.   Disposition: Home or Self Care   Current Discharge Medication List    START taking these medications   Details  acetaminophen (TYLENOL) 325 MG tablet Take 2 tablets (650 mg total) by mouth every 4 (four) hours as needed. Qty: 30 tablet    aspirin EC 81 MG EC tablet Take 1 tablet (81 mg total) by mouth daily.    escitalopram (LEXAPRO) 10 MG tablet Take 1 tablet (10 mg total) by mouth daily. Qty: 1 tablet, Refills: 0    hydrALAZINE (APRESOLINE) 50 MG tablet Take 1 tablet (50 mg total) by mouth 2 (two) times daily. Qty: 60 tablet, Refills: 5    insulin glargine (LANTUS) 100 UNIT/ML injection Inject 30 Units into the skin daily. Qty: 1 mL, Refills: 0    isosorbide mononitrate (IMDUR) 30 MG 24 hr tablet Take 1 tablet (30 mg total) by mouth daily. Qty: 30 tablet, Refills: 5    nitroGLYCERIN (NITROSTAT) 0.4 MG SL tablet Place 1  tablet (0.4 mg total) under the tongue every 5 (five) minutes as needed for chest pain. Qty: 25 tablet, Refills: 2    pantoprazole (PROTONIX) 40 MG tablet Take 1 tablet (40 mg total) by mouth 2 (two) times daily before a meal. Qty: 30 tablet, Refills: 5    rosuvastatin (CRESTOR) 10 MG tablet Take 1 tablet (10 mg total) by mouth daily at 6 PM. Qty: 30 tablet, Refills: 5      CONTINUE these medications which have CHANGED   Details  metFORMIN (GLUCOPHAGE) 500 MG tablet Take 1 tablet (500 mg total) by mouth 2 (two) times daily with a meal. Qty: 1 tablet, Refills: 0      CONTINUE these medications which have NOT CHANGED   Details  azithromycin (ZITHROMAX) 250 MG tablet Take 250 mg by mouth daily. Was picked up from pharmacy on 11/7     carvedilol (COREG) 25 MG tablet Take 25 mg by mouth 2 (two) times daily with a meal.      furosemide (LASIX) 40 MG tablet Take 40 mg by mouth 2 (two) times daily.      lisinopril (PRINIVIL,ZESTRIL) 20 MG tablet Take 20 mg by mouth daily.      potassium chloride (K-DUR) 10 MEQ tablet Take 10 mEq by mouth 2 (two) times daily.      Rivaroxaban (XARELTO) 15 MG TABS tablet Take 15 mg by mouth daily.      zolpidem (AMBIEN) 10 MG tablet Take 10 mg  by mouth at bedtime as needed.           SignedErlene Quan 12/21/2010, 4:32 PM

## 2010-12-21 NOTE — Progress Notes (Signed)
Ambulated along the hallway for 10 min. Tolerated well, denied nay discomfort.

## 2010-12-21 NOTE — Progress Notes (Signed)
DISCHARGED HOME, ACCAMPANIED BY DAUGHTER, STABLE, BELONGING [facility] PT.

## 2010-12-21 NOTE — Progress Notes (Signed)
WRITTEN DISCHARGE AND PRESCRIPTIONS GIVEN TO PT.

## 2010-12-21 NOTE — Progress Notes (Signed)
To the cath lab by bed awake and alert, stable.

## 2010-12-21 NOTE — Progress Notes (Signed)
1220PM- CAME BACK FROM CATH LAB AWAKE AND ALERT, LEFT WRIST TR BAND INTACT., ELEVATED WITH PILLOW, PULSE OX ON LEFT THUMB, INSTRUCTED TO CALL FOR ANY DISCOMFORT.

## 2010-12-21 NOTE — Procedures (Signed)
TR BAND DEFLATED WITH 3 ML OF AIR, TOL. WELL , LEFT ELEVATED WITH PILLOW. NO BLEEDING NOTED.

## 2010-12-21 NOTE — Progress Notes (Signed)
Performing MD:  Adamari Frede W  Procedure:  Left Heart Catheterization, coronary angiography with possible ad-hoc percutaneous coronary intervention.  The procedure with Risks/Benefits/Alternatives and Indications was reviewed with the patient and daughter.  All questions were answered.    Risks / Complications include, but not limited to: Death, MI, CVA/TIA, VF/VT (with defibrillation), Bradycardia (need for temporary pacer placement), contrast induced nephropathy, bleeding / bruising / hematoma / pseudoaneurysm, vascular or coronary injury (with possible emergent CT or Vascular Surgery), adverse medication reactions, infection.     The patient (and family) voice understanding and agree to proceed.   Consent given by: the patient   I have signed the consent form and placed it on the chart for patient signature and RN witness.     Seneca Gadbois W 12/21/2010, 9:30 AM

## 2010-12-21 NOTE — Procedures (Signed)
[place]     CARDIAC CATHETERIZATION REPORT  Alan [name]   MRN: [id number] 08/04/1940    ADMISSION DATE: 12/20/2010  Performing Cardiologist: Alan [name] Primary Physician: Alan Noon, MD Primary Cardiologist:  Alan [name] M.D.   Procedures Performed:  Left Heart Catheterization via 5 Fr left radial artery access  Left Ventriculography, (RAO/LAO) 12 ml/sec for 30 ml total contrast  Native Coronary Angiography  Left Internal Mammary Graft Angiography  Saphenous Vein GraftGraft Angiography)  Indication(s): Concern for unstable angina in patient with known coronary artery disease.  Nonsustained atrial tachycardia on pacemaker evaluation  History: 70 y.o. male was recently admitted for chest pain and received nitroglycerin with resolution of chest pain the pain began to radiate to his epigastric area. His pacemaker was interrogated and noted to nonsustained ventricular tachycardia.  He is therefore referred for diagnostic heart catheterization and possible percutaneous intervention. Coronary Artery Disease status post LIMA-D1-LAD in 1990s followed single-vessel CABG SVG to RPDA with MAZE.    Consent: The procedure with Risks/Benefits/Alternatives and Indications was reviewed with the patient (and family).  All questions were answered.    Risks / Complications include, but not limited to: Death, MI, CVA/TIA, VF/VT (with defibrillation), Bradycardia (need for temporary pacer placement), contrast induced nephropathy , bleeding / bruising / hematoma / pseudoaneurysm, vascular or coronary injury (with possible emergent CT or Vascular Surgery), adverse medication reactions, infection.  Additional risks of bleeding as the patient is on Xarelto as well as additional risk of potential vein graft intervention was discussed.  The patient (and family) voice understanding and agree to proceed.    Risks of procedure as well as the alternatives and risks of  each were explained to the (patient/caregiver).  Consent for procedure obtained. Consent for signed by MD and patient with RN witness -- placed on chart.  Procedure: The patient was brought to the [facility] in the fasting state and prepped and draped in the usual sterile fashion for Left  Radial access. (A modified Allen's test with plethysmography was performed on the left wrist demonstrating adequate Ulnar Artery collateral flow).    Sterile technique was used including antiseptics, cap, gloves, gown, hand hygiene, mask and sheet.  Skin prep: Chlorhexidine;  Time Out: Verified patient identification, verified procedure, site/side was marked, verified correct patient position, special equipment/implants available, medications/allergies/relevent history reviewed, required imaging and test results available.  Performed  The left wrist was anesthetized with 1% subcutaneous Lidocaine.  The left radial artery was accessed using the Seldinger Technique with placement of a 5 Fr Glide Sheath. The sheath was aspirated and flushed.  Then a total of 10 ml of standard Radial Artery Cocktail (see medications) was infused.  Radial Cocktail: 5 mg Verapamil, 400 mcg NTG, 2 ml 2% Lidocaine   The 5 [place] JR 4 catheter was advanced over a Versicorewire used to engage the Saphenous Vein Grafts(SVG)  to the Right PDA.  Multiple cineangiographic views of the SVGs to the RPDA were performed.  The Judkin's R4 catheter was redirected and advanced over a wire into the Left Subclavian Artery where it was used to engage the Left Internal Mammary Artery Graft (LIMA) and multiple cineangiographic images of the LIMA-D1-LAD graft were performed.  A 5 Fr JL 3.5 Catheter was advanced of over a Web designer J  wire into the ascending Aorta.  The catheter was used  engage The Left Coronary artery.  Multiple cineangiographic views of the left coronary  artery system(s) were performed.    This catheter was then exchanged over the Long Exchange Safety J wire for an angled Pigtail catheter that was advanced across the Aortic Valve.  LV hemodynamics were measured and Left Ventriculography was performed.  LV hemodynamics were then re-sampled, and the catheter was pulled back across the Aortic Valve for measurement of "pull-back" gradient.  The catheter  the wire was removed completely out of the body.  The catheter was then pulled back into the descending aorta and removed completely out of the body over a wire.   The sheath was removed in the Cath Lab with a TR band placed at 11 ml Air at 1110 (time).  Reverse Allen's test revealed non-occlusive hemostasis.  The patient was transported to the holding area followed by a 10 unit in stable condition.   The patient  was stable before, during and following the procedure.   Patient did tolerate procedure well. There were not complications.  EBL: Less than 10 mL  Medications:  Sedation:  2 mg IV Versed, 50 IV mcg Fentanyl  Contrast:  150 mL Omnipaque  Radial Cocktail: 5 mg Verapamil, 400 mcg NTG, 2 ml 2% Lidocaine  Hemodynamics:  Central Aortic Pressure / Mean Aortic Pressure: 117/14 mm Hg, 22 mm AA Hg  LV Pressure / LV End diastolic Pressure:  [id number] mmHg, 82, mmHg  Left Ventriculography:  EF:  ~50-55%  Wall Motion: no obvious WMA  Coronary Angiographic Data:  Dominance: Right  Left Main:  Large-caliber, ostial 20%.  Left Anterior Descending (LAD):  100% proximal occluded, fills via LIMA graft with brisk distal flow and no significant disease distally, also fills retrograde to occlusion site.  1st diagonal (D1):  Fills via the first leg of LIMA, remainder the vessel has no significant disease as this vessel bifurcates along the lateral wall  Circumflex (LCx):  Large-caliber vessel giving rise to a large second obtuse marginal and smaller OM 1 and a small to moderate AV groove branch That has several posterior lateral  branchs  1st obtuse marginal:  Small roughly 1.5-2.0 mm vessel, ostial ~60% lesion similar to prior angiography, not flow limiting  2nd obtuse marginal:  Large vessel reaches down along the lateral wall, in the midportion there is a 10 mm tubular adenosis of roughly 40%, also similar to prior angiography  3rd obtuse marginal:  Small diffuse nonflow limiting luminal irregularities   posterior lateral branch:  Small branching vessel with diffuse nonflow limiting luminal irregularites  Right Coronary Artery: Native artery not imaged, known proximal 100% occlusion.  Fills retrograde up to small RV marginal via SVG  posterior descending artery: Subtotally occluded after vein graft insertion  RPIVG -  posterior lateral branch:  Fills via retrograde flow from PDA, extensive small vessel branching  Grafts  Sequential LIMA - D1- LAD: Large tortuous graft the potential to D1 then LAD, angiographically normal  SVG - RCA (RPDA/RPL): Large vessel no stenosis, widely patent  Impression: 1.  Widely patent LIMA-D1-LAD with excellent distal LAD and diagonal flow. 2.  Widely patent SVG-RPDA with retrograde filling to the RPL system and native RCA demonstrating diffuse in-stent stenosis of RCA stent.  3.  Known subtotal occlusion of RPDA beyond SVG. 4.  Native circumflex was large OM 2 small moderate OM1 and remaining vessel moderate disease, most significant being ostial OM1 60% in Xarelto <2 mm vessel. This appeared angiographically similar to prior angiography in 2011.  Plan: 1.  Standard post radial care, will use post intervention  guideline for TR band removal as the patient is on Xarelto. 2.  Restart several Xarelto this evening. 3.  Anticipate discharge this evening after post cath care.  The case and results was discussed with the patient (and family). The case and results was not discussed with the patient's PCP. The case and results was discussed with the patient's Cardiologist.  Time  Spend Directly with Patient:  35 minutes  Emarion Toral W 12/21/2010, 11:47 AM

## 2010-12-21 NOTE — Progress Notes (Addendum)
Subjective:  Pt with h/o of [place]. No concerns with CP, SOB and or anginal equivalent sx last night. Slated for dx LHC today.  Objective:  Vital Signs in the last 24 hours: Temp:  [97.3 F (36.3 C)-98.8 F (37.1 C)] 98.8 F (37.1 C) (11/13 2327) Pulse Rate:  [59-67] 59  (11/14 0200) Resp:  [16-22] 16  (11/14 0200) BP: (105-157)/(42-116) 135/63 mmHg (11/14 0200) SpO2:  [93 %-100 %] 95 % (11/14 0200) Weight:  [110.7 kg (244 lb 0.8 oz)] 244 lb 0.8 oz (110.7 kg) (11/13 1939)  Intake/Output from previous day: 11/13 0701 - 11/14 0700 In: 249.7 [P.O.:120; I.V.:129.7] Out: -  Intake/Output from this shift: Total I/O In: 249.7 [P.O.:120; I.V.:129.7] Out: -   Physical Exam: General appearance: alert, cooperative, appears stated age and no distress Neck: no adenopathy, no carotid bruit, no JVD, supple, symmetrical, trachea midline and thyroid not enlarged, symmetric, no tenderness/mass/nodules Lungs: clear to auscultation bilaterally, normal percussion bilaterally and non-labored Heart: regular rate and rhythm, S1, S2 normal, no murmur, click, rub or gallop Abdomen: soft, non-tender; bowel sounds normal; no masses,  no organomegaly and obese Extremities: extremities normal, atraumatic, no cyanosis or edema and  Pulses: 2+ and symmetric L wrist - Allens' test normal (without pleth). Neurologic: Alert and oriented X 3, normal strength and tone. Normal symmetric reflexes. Normal coordination and gait Mallampati Airway - 3, H/o OSA  Lab Results:  Basename 12/20/10 0400  WBC 7.3  HGB 12.7*  PLT 175    Basename 12/20/10 0902 12/20/10 0400  NA 137 137  K 5.2* 4.6  CL 106 103  CO2 23 22  GLUCOSE 155* 96  BUN 25* 29*  CREATININE 1.04 1.15    Basename 12/20/10 1604 12/20/10 1204  TROPONINI <0.30 <0.30   Hepatic Function Panel  Basename 12/20/10 0902  PROT 7.7  ALBUMIN 3.7  AST 17  ALT 20  ALKPHOS 57  BILITOT 0.3  BILIDIR --  IBILI --    Basename 12/20/10 0903  CHOL 134    No results found for this basename: PROTIME in the last 72 hours  Imaging: Dg Chest 2 View  12/20/2010  *RADIOLOGY REPORT*  Clinical Data: Chest pain, shortness of breath, cough and weakness.  CHEST - 2 VIEW  Comparison: Chest radiograph performed 06/23/2010  Findings: The lungs are well-aerated.  Mild vascular congestion is noted, without evidence of pulmonary edema.  Mild bibasilar atelectasis is seen.  There is no evidence of pleural effusion or pneumothorax.  The heart is mildly enlarged; the patient is status post median sternotomy.  A pacemaker is noted at the left chest wall, with leads ending at the right atrium and right ventricle.  No acute osseous abnormalities are seen.  Mild degenerative change is noted along the lower thoracic spine.  IMPRESSION: Mild vascular congestion and mild cardiomegaly noted; mild bibasilar atelectasis seen.  Original Report Authenticated By: Santa Lighter, M.D.    Cardiac Studies:  Assessment/Plan:  1) [place], dx LHC today 2) post polio syndrome 3) OSA 4)CAD - NSVT on PPM interrogation 5)PAF 6)h/o CHB   LOS: 1 day    [name],Alan E 12/21/2010, 5:38 AM  I have seen and examined the patient along with Alan Clan, PA.  I have reviewed the chart, notes and new data.  I agree with Alan's note.  Key new complaints: None - pain from chest became abdominal with gas & bloating, comfortable now Key examination changes: none Key new findings / data: PPM interrogation showed NVST, Trop negative (  none checked today).  PLAN: LHC +/- PCI today - will try Left Radial due to Xarelto.    Risks of procedure as well as the alternatives and risks of each were explained to the (patient/caregiver).  Consent for procedure obtained. Consent given by the patient.   Alan [name] 12/21/2010, 9:25 AM

## 2010-12-21 NOTE — Progress Notes (Signed)
3 ML OF AIR DEFLATED FROM THE TR BAND TOL.WELL, NO BLEEDING NOTED.

## 2010-12-21 NOTE — Progress Notes (Signed)
TR BAND REMOVED AND TOLERATED WELL. NO BLEEDING NOR HEMATOMA NOTED.

## 2010-12-21 NOTE — Progress Notes (Signed)
3 ML OF AIR DEFLATED FROM THE TR BAND TOLERATED WELL. NO BLEEDING NOTED. ARM KEPT ELEVATED WITH PILLOW.

## 2010-12-22 LAB — GLUCOSE, CAPILLARY
Glucose-Capillary: 130 mg/dL — ABNORMAL HIGH (ref 70–99)
Glucose-Capillary: 136 mg/dL — ABNORMAL HIGH (ref 70–99)

## 2011-02-28 ENCOUNTER — Encounter: Payer: Self-pay | Admitting: Internal Medicine

## 2011-03-02 ENCOUNTER — Encounter: Payer: Self-pay | Admitting: Internal Medicine

## 2011-03-02 ENCOUNTER — Ambulatory Visit (INDEPENDENT_AMBULATORY_CARE_PROVIDER_SITE_OTHER): Payer: Medicare Other | Admitting: Internal Medicine

## 2011-03-02 VITALS — BP 122/62 | HR 64 | Ht 70.0 in | Wt 239.6 lb

## 2011-03-02 DIAGNOSIS — G4733 Obstructive sleep apnea (adult) (pediatric): Secondary | ICD-10-CM

## 2011-03-02 NOTE — Patient Instructions (Signed)
Continue CPAP 11- Ask [facility] about when you could get a replacement machine

## 2011-03-02 NOTE — Progress Notes (Signed)
03/02/11 70 yoM never smoker followed for OSA, history UPPP. Complicated by Post polio, AFib/ pacemaker   PCP Dr Melford Aase LOV-Jan 28, 2010 He has continued using CPAP 11/[facility] all night every night. He denies snoring or daytime tiredness. He likes his nasal pillows mask.  ROS-HPI Constitutional:   No-   weight loss, night sweats, fevers, chills, fatigue, lassitude. HEENT:   No-  headaches, difficulty swallowing, tooth/dental problems, sore throat,       No-  sneezing, itching, ear ache, nasal congestion, post nasal drip,  CV:  No-   chest pain, orthopnea, PND, swelling in lower extremities, anasarca, dizziness, palpitations Resp: No-   shortness of breath with exertion or at rest.              No-   productive cough,  No non-productive cough,  No- coughing up of blood.              No-   change in color of mucus.  No- wheezing.   Skin: No-   rash or lesions. GI:  No-   heartburn, indigestion, abdominal pain, nausea, vomiting, diarrhea,                 change in bowel habits, loss of appetite GU:  MS:  No-   joint pain or swelling.  No- decreased range of motion.  No- back pain. Neuro-     nothing unusual Psych:  No- change in mood or affect. No depression or anxiety.  No memory loss.  OBJ General- Alert, Oriented, Affect-appropriate, Distress- none acute, overweight Skin- rash-none, lesions- none, excoriation- none Lymphadenopathy- none Head- atraumatic            Eyes- Gross vision intact, PERRLA, conjunctivae clear secretions            Ears- Hearing, canals-normal            Nose- Clear, no-Septal dev, mucus, polyps, erosion, perforation             Throat- Mallampati III/ sp UPPP , mucosa clear , drainage- none, tonsils- atrophic Neck- flexible , trachea midline, no stridor , thyroid nl, carotid no bruit Chest - symmetrical excursion , unlabored           Heart/CV- RRR to palpation , no murmur , no gallop  , no rub, nl s1 s2                           - JVD- none , edema- none,  stasis changes- none, varices- none           Lung- clear to P&A, wheeze- none, cough- none , dullness-none, rub- none           Chest wall-  Abd- Br/ Gen/ Rectal- Not done, not indicated Extrem- cyanosis- none, clubbing, none, atrophy- none, strength- nl Neuro- grossly intact to observation

## 2011-03-05 NOTE — Assessment & Plan Note (Signed)
He reports good compliance and control with CPAP and no changes needed. We discussed timing for getting replacement machine and he will discuss this further with [facility].

## 2011-03-07 ENCOUNTER — Telehealth: Payer: Self-pay | Admitting: Internal Medicine

## 2011-03-07 DIAGNOSIS — G4733 Obstructive sleep apnea (adult) (pediatric): Secondary | ICD-10-CM

## 2011-03-07 NOTE — Telephone Encounter (Signed)
Order sent to [facility] for a new CPAP machine. LMOM for pt to be made aware.

## 2011-10-19 ENCOUNTER — Other Ambulatory Visit (HOSPITAL_COMMUNITY): Payer: Self-pay | Admitting: Orthopedic Surgery

## 2011-10-19 DIAGNOSIS — M25569 Pain in unspecified knee: Secondary | ICD-10-CM

## 2011-10-19 DIAGNOSIS — M25562 Pain in left knee: Secondary | ICD-10-CM

## 2011-10-24 ENCOUNTER — Encounter (HOSPITAL_COMMUNITY)
Admission: RE | Admit: 2011-10-24 | Discharge: 2011-10-24 | Disposition: A | Discharge status: Home / Self Care | Payer: Medicare Other | Source: Ambulatory Visit | Attending: Orthopedic Surgery | Admitting: Orthopedic Surgery

## 2011-10-24 ENCOUNTER — Encounter (HOSPITAL_COMMUNITY): Payer: Self-pay

## 2011-10-24 DIAGNOSIS — Z96659 Presence of unspecified artificial knee joint: Secondary | ICD-10-CM | POA: Insufficient documentation

## 2011-10-24 DIAGNOSIS — M25562 Pain in left knee: Secondary | ICD-10-CM

## 2011-10-24 DIAGNOSIS — Z96649 Presence of unspecified artificial hip joint: Secondary | ICD-10-CM | POA: Insufficient documentation

## 2011-10-24 DIAGNOSIS — M25569 Pain in unspecified knee: Secondary | ICD-10-CM

## 2011-10-24 MED ORDER — TECHNETIUM TC 99M MEDRONATE IV KIT
23.5000 | PACK | Freq: Once | INTRAVENOUS | Status: AC | PRN
  Administered 2011-10-24: 23.5 via INTRAVENOUS

## 2011-11-23 ENCOUNTER — Other Ambulatory Visit: Payer: Self-pay | Admitting: Neurological Surgery

## 2011-11-23 DIAGNOSIS — M545 Low back pain, unspecified: Secondary | ICD-10-CM

## 2011-11-29 ENCOUNTER — Ambulatory Visit
Admission: RE | Admit: 2011-11-29 | Discharge: 2011-11-29 | Disposition: A | Discharge status: Home / Self Care | Payer: Medicare Other | Source: Ambulatory Visit | Attending: Neurological Surgery | Admitting: Neurological Surgery

## 2011-11-29 VITALS — BP 138/63 | HR 66

## 2011-11-29 DIAGNOSIS — M545 Low back pain, unspecified: Secondary | ICD-10-CM

## 2011-11-29 MED ORDER — IOHEXOL 180 MG/ML  SOLN
18.0000 mL | Freq: Once | INTRAMUSCULAR | Status: AC | PRN
  Administered 2011-11-29: 18 mL via INTRATHECAL

## 2011-11-29 MED ORDER — DIAZEPAM 5 MG PO TABS
5.0000 mg | ORAL_TABLET | Freq: Once | ORAL | Status: AC
  Administered 2011-11-29: 5 mg via ORAL

## 2011-11-29 NOTE — Progress Notes (Signed)
Pt has been off coumadin and states his INR is 1.2, called office for hard copy.

## 2012-01-01 ENCOUNTER — Other Ambulatory Visit: Payer: Self-pay | Admitting: Neurological Surgery

## 2012-01-01 ENCOUNTER — Encounter (HOSPITAL_COMMUNITY): Payer: Self-pay | Admitting: Pharmacy Technician

## 2012-01-03 ENCOUNTER — Encounter (HOSPITAL_COMMUNITY): Payer: Self-pay | Admitting: Pharmacy Technician

## 2012-01-03 ENCOUNTER — Encounter (HOSPITAL_COMMUNITY)
Admission: RE | Admit: 2012-01-03 | Discharge: 2012-01-03 | Disposition: A | Discharge status: Home / Self Care | Payer: Medicare Other | Source: Ambulatory Visit | Attending: Neurological Surgery | Admitting: Neurological Surgery

## 2012-01-03 ENCOUNTER — Ambulatory Visit (HOSPITAL_COMMUNITY)
Admission: RE | Admit: 2012-01-03 | Discharge: 2012-01-03 | Disposition: A | Discharge status: Home / Self Care | Payer: Medicare Other | Source: Ambulatory Visit | Attending: Neurological Surgery | Admitting: Neurological Surgery

## 2012-01-03 ENCOUNTER — Encounter (HOSPITAL_COMMUNITY): Payer: Self-pay

## 2012-01-03 DIAGNOSIS — Z01810 Encounter for preprocedural cardiovascular examination: Secondary | ICD-10-CM | POA: Insufficient documentation

## 2012-01-03 DIAGNOSIS — Z01812 Encounter for preprocedural laboratory examination: Secondary | ICD-10-CM | POA: Insufficient documentation

## 2012-01-03 DIAGNOSIS — I517 Cardiomegaly: Secondary | ICD-10-CM | POA: Insufficient documentation

## 2012-01-03 DIAGNOSIS — Z01818 Encounter for other preprocedural examination: Secondary | ICD-10-CM | POA: Insufficient documentation

## 2012-01-03 HISTORY — DX: Myoneural disorder, unspecified: G70.9

## 2012-01-03 HISTORY — DX: Acute poliomyelitis, unspecified: A80.9

## 2012-01-03 HISTORY — DX: Dorsalgia, unspecified: M54.9

## 2012-01-03 HISTORY — DX: Other chronic pain: G89.29

## 2012-01-03 HISTORY — DX: Sleep apnea, unspecified: G47.30

## 2012-01-03 LAB — CBC WITH DIFFERENTIAL/PLATELET
Basophils Absolute: 0 10*3/uL (ref 0.0–0.1)
HCT: 38.6 % — ABNORMAL LOW (ref 39.0–52.0)
Hemoglobin: 13.2 g/dL (ref 13.0–17.0)
Lymphocytes Relative: 25 % (ref 12–46)
Monocytes Absolute: 0.6 10*3/uL (ref 0.1–1.0)
Neutro Abs: 3.4 10*3/uL (ref 1.7–7.7)
RDW: 12.7 % (ref 11.5–15.5)
WBC: 5.5 10*3/uL (ref 4.0–10.5)

## 2012-01-03 LAB — SURGICAL PCR SCREEN
MRSA, PCR: NEGATIVE
Staphylococcus aureus: NEGATIVE

## 2012-01-03 LAB — PROTIME-INR: INR: 1.96 — ABNORMAL HIGH (ref 0.00–1.49)

## 2012-01-03 LAB — BASIC METABOLIC PANEL
Chloride: 100 mEq/L (ref 96–112)
GFR calc Af Amer: 72 mL/min — ABNORMAL LOW (ref >90)
Potassium: 4.2 mEq/L (ref 3.5–5.1)

## 2012-01-03 NOTE — Progress Notes (Addendum)
Faxed request to [facility] cardiology, Dr. Ellyn Hack, for last office visit notes, cardiac cath/stress/echo/ekg.  Also faxed request to Dr. Keturah Barre for last office visit notes and sleep study.  Forwarded abnormal PT/INR to anesthesia for review.

## 2012-01-03 NOTE — Pre-Procedure Instructions (Signed)
[place]  01/03/2012   Your procedure is scheduled on:  Monday January 10, 2012  Report to [facility] at 12:20PM.  Call this number if you have problems the morning of surgery: [phone]   Remember:   Do not eat food or drink :After Midnight.    Take these medicines the morning of surgery with A SIP OF WATER: amlodipine, coreg, hydralazine,  nitroglycerin (if needed),    Do not wear jewelry, make-up or nail polish.  Do not wear lotions, powders, or perfumes.   Do not shave 48 hours prior to surgery. Men may shave face and neck.  Do not bring valuables to the hospital.  Contacts, dentures or bridgework may not be worn into surgery.  Leave suitcase in the car. After surgery it may be brought to your room.  For patients admitted to the hospital, checkout time is 11:00 AM the day of discharge.   Patients discharged the day of surgery will not be allowed to drive home.    Special Instructions: Shower using CHG 2 nights before surgery and the night before surgery.  If you shower the day of surgery use CHG.  Use special wash - you have one bottle of CHG for all showers.  You should use approximately 1/3 of the bottle for each shower.   Please read over the following fact sheets that you were given: Pain Booklet, Coughing and Deep Breathing, MRSA Information and Surgical Site Infection Prevention

## 2012-01-08 NOTE — Consult Note (Addendum)
Anesthesia Chart Review:  Patient is a 71 year old male scheduled for L3-4 decompressive laminectomy by Dr. Ronnald Ramp on 01/10/12.    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  Cardiologist was Dr. Rex Kras, recently retired.  Per [facility], his new primary cardiologist with either be Dr. Ellyn Hack or Dr. Sallyanne Kuster.  Dr. Ellyn Hack is the cardiologist who completed patient's pre-operative clearance note on 12/20/11 stating the patient was at "moderate to high" CV risk.  In addition, he writes, "He has existing CAD- but no recent symptoms.  Last episode was ~ 1 year ago.  Does have intermittent short bursts of non-sustained VTach.  Mostly controlled with BB.  Would not stop BB peri-op.  Use IV BB if NPO post-op.  [facility] will be happy to assist with care when in-patient.  Call with ?'s/concerns.  If further risk stratification is desired, we will proceed with Myoview ST."  Cardiac cath on 12/21/10 showed: 1. Widely patent LIMA-D1-LAD with excellent distal LAD and diagonal flow.  2. Widely patent SVG-RPDA with retrograde filling to the RPL system and native RCA demonstrating diffuse in-stent stenosis of RCA stent.  3. Known subtotal occlusion of RPDA beyond SVG.  4. Native circumflex was large OM 2 small moderate OM1 and remaining vessel moderate disease, most significant being ostial OM1 60% in Xarelto <2 mm vessel. This appeared angiographically similar to prior angiography in 2011.  5. Left Ventriculography: EF 50-55%, no obvious wall motion abnormalities.  Echo on 06/29/08 showed atrial paced rhythm, mildly reduced LV systolic function, EF [id number], Doppler flow patter suggestive of impaired LV relaxation, LA moderately dilated, RV systolic pressure was elevated at 33 mmHg, aortic valve appeared mildly sclerotic, no AS.  Mild MR/TR.  EKG from 01/03/12  showed a-pacing with prolonged AV conduction, T wave abnormality, consider anterior ischemia. It was not felt significantly changed from his previous EKG (see EKGs from 06/23/10 and 12/21/10).  CXR from 01/03/12 showed: 1. No acute cardiopulmonary disease.  2. Unchanged cardiomegaly including left atrial enlargement.  3. Unchanged enlargement of the main pulmonary artery.   Pre-operative labs noted.  Repeat PT/INR and get a PTT on the day of surgery.  I reviewed cardiac history and clearance note with Anesthesiologists Dr. Glennon Mac and Dr. Tamala Julian.  Both felt that with patient's history of CAD and non-sustained VT, that it would be best for patient to undergo further risk stratification with Myoview stress testing as mentioned in Dr. Maddalena Linarez Quarry clearance note.  I have notified Alan [name] at Dr. Ronnald Ramp' office.     Myra Gianotti, PA-C 01/08/12 1545  Addendum: 01/09/12 1500 I spoke with Alan [name] at Dr. Ronnald Ramp' office.  She says the Dr. Ronnald Ramp is aware of above recommendations and would still like Alan [name] to come in tomorrow to be evaluated by the Anesthesiologist and then go from there.  She believes that he spoke with one of our Anesthesiologists late yesterday afternoon.  I was told he is aware that there is a possibility the case could be postponed or canceled.

## 2012-01-09 MED ORDER — CEFAZOLIN SODIUM-DEXTROSE 2-3 GM-% IV SOLR
2.0000 g | INTRAVENOUS | Status: AC
  Administered 2012-01-10: 2 g via INTRAVENOUS
  Filled 2012-01-09: qty 50

## 2012-01-09 NOTE — Progress Notes (Signed)
Pt notified of time change;to arrive at 1300

## 2012-01-10 ENCOUNTER — Inpatient Hospital Stay (HOSPITAL_COMMUNITY): Payer: Medicare Other

## 2012-01-10 ENCOUNTER — Encounter (HOSPITAL_COMMUNITY)
Admission: RE | Disposition: A | Discharge status: Home / Self Care | Payer: Self-pay | Source: Ambulatory Visit | Attending: Neurological Surgery

## 2012-01-10 ENCOUNTER — Encounter (HOSPITAL_COMMUNITY): Payer: Self-pay | Admitting: *Deleted

## 2012-01-10 ENCOUNTER — Inpatient Hospital Stay (HOSPITAL_COMMUNITY): Payer: Medicare Other | Admitting: Vascular Surgery

## 2012-01-10 ENCOUNTER — Inpatient Hospital Stay (HOSPITAL_COMMUNITY)
Admission: RE | Admit: 2012-01-10 | Discharge: 2012-01-11 | DRG: 460 | Disposition: A | Discharge status: Home / Self Care | Payer: Medicare Other | Source: Ambulatory Visit | Attending: Neurological Surgery | Admitting: Neurological Surgery

## 2012-01-10 ENCOUNTER — Encounter (HOSPITAL_COMMUNITY): Payer: Self-pay | Admitting: Vascular Surgery

## 2012-01-10 DIAGNOSIS — I2510 Atherosclerotic heart disease of native coronary artery without angina pectoris: Secondary | ICD-10-CM | POA: Diagnosis present

## 2012-01-10 DIAGNOSIS — M48061 Spinal stenosis, lumbar region without neurogenic claudication: Principal | ICD-10-CM | POA: Diagnosis present

## 2012-01-10 DIAGNOSIS — E119 Type 2 diabetes mellitus without complications: Secondary | ICD-10-CM | POA: Diagnosis present

## 2012-01-10 DIAGNOSIS — Q762 Congenital spondylolisthesis: Secondary | ICD-10-CM

## 2012-01-10 DIAGNOSIS — G4730 Sleep apnea, unspecified: Secondary | ICD-10-CM | POA: Diagnosis present

## 2012-01-10 DIAGNOSIS — G709 Myoneural disorder, unspecified: Secondary | ICD-10-CM | POA: Diagnosis present

## 2012-01-10 DIAGNOSIS — Z981 Arthrodesis status: Secondary | ICD-10-CM

## 2012-01-10 DIAGNOSIS — I10 Essential (primary) hypertension: Secondary | ICD-10-CM | POA: Diagnosis present

## 2012-01-10 DIAGNOSIS — Z951 Presence of aortocoronary bypass graft: Secondary | ICD-10-CM

## 2012-01-10 DIAGNOSIS — I4891 Unspecified atrial fibrillation: Secondary | ICD-10-CM | POA: Diagnosis present

## 2012-01-10 HISTORY — PX: LUMBAR LAMINECTOMY/DECOMPRESSION MICRODISCECTOMY: SHX5026

## 2012-01-10 LAB — PROTIME-INR: Prothrombin Time: 13.7 seconds (ref 11.6–15.2)

## 2012-01-10 LAB — APTT: aPTT: 28 seconds (ref 24–37)

## 2012-01-10 SURGERY — LUMBAR LAMINECTOMY/DECOMPRESSION MICRODISCECTOMY 1 LEVEL
Anesthesia: General | Site: Back | Laterality: Bilateral | Wound class: Clean

## 2012-01-10 MED ORDER — FENTANYL CITRATE 0.05 MG/ML IJ SOLN
INTRAMUSCULAR | Status: DC | PRN
  Administered 2012-01-10: 150 ug via INTRAVENOUS

## 2012-01-10 MED ORDER — GLIPIZIDE 10 MG PO TABS
10.0000 mg | ORAL_TABLET | Freq: Every day | ORAL | Status: DC
  Administered 2012-01-11: 10 mg via ORAL
  Filled 2012-01-10 (×2): qty 1

## 2012-01-10 MED ORDER — ONDANSETRON HCL 4 MG/2ML IJ SOLN: 4.0000 mg | INTRAMUSCULAR | Status: DC | PRN

## 2012-01-10 MED ORDER — BUPIVACAINE HCL (PF) 0.25 % IJ SOLN
INTRAMUSCULAR | Status: DC | PRN
  Administered 2012-01-10: 4 mL

## 2012-01-10 MED ORDER — 0.9 % SODIUM CHLORIDE (POUR BTL) OPTIME
TOPICAL | Status: DC | PRN
  Administered 2012-01-10: 1000 mL

## 2012-01-10 MED ORDER — CARVEDILOL 25 MG PO TABS: 25.0000 mg | ORAL_TABLET | Freq: Two times a day (BID) | ORAL | Status: DC

## 2012-01-10 MED ORDER — ACETAMINOPHEN 325 MG PO TABS: 650.0000 mg | ORAL_TABLET | ORAL | Status: DC | PRN

## 2012-01-10 MED ORDER — SUCCINYLCHOLINE CHLORIDE 20 MG/ML IJ SOLN
INTRAMUSCULAR | Status: DC | PRN
  Administered 2012-01-10: 180 mg via INTRAVENOUS

## 2012-01-10 MED ORDER — AMLODIPINE BESYLATE 5 MG PO TABS
5.0000 mg | ORAL_TABLET | Freq: Two times a day (BID) | ORAL | Status: DC
  Administered 2012-01-10 – 2012-01-11 (×2): 5 mg via ORAL
  Filled 2012-01-10 (×3): qty 1

## 2012-01-10 MED ORDER — SODIUM CHLORIDE 0.9 % IV SOLN
INTRAVENOUS | Status: AC
  Filled 2012-01-10: qty 500

## 2012-01-10 MED ORDER — POTASSIUM CHLORIDE IN NACL 20-0.9 MEQ/L-% IV SOLN
INTRAVENOUS | Status: DC
  Administered 2012-01-10: 21:00:00 via INTRAVENOUS
  Filled 2012-01-10 (×3): qty 1000

## 2012-01-10 MED ORDER — SODIUM CHLORIDE 0.9 % IV SOLN: 250.0000 mL | INTRAVENOUS | Status: DC

## 2012-01-10 MED ORDER — SODIUM CHLORIDE 0.9 % IJ SOLN
3.0000 mL | Freq: Two times a day (BID) | INTRAMUSCULAR | Status: DC
  Administered 2012-01-10: 3 mL via INTRAVENOUS

## 2012-01-10 MED ORDER — ALBUMIN HUMAN 5 % IV SOLN
INTRAVENOUS | Status: DC | PRN
  Administered 2012-01-10: 16:00:00 via INTRAVENOUS

## 2012-01-10 MED ORDER — HYDROCHLOROTHIAZIDE 25 MG PO TABS
25.0000 mg | ORAL_TABLET | Freq: Every day | ORAL | Status: DC
  Administered 2012-01-11: 25 mg via ORAL
  Filled 2012-01-10: qty 1

## 2012-01-10 MED ORDER — METHOCARBAMOL 500 MG PO TABS
500.0000 mg | ORAL_TABLET | Freq: Four times a day (QID) | ORAL | Status: DC | PRN
  Filled 2012-01-10: qty 1

## 2012-01-10 MED ORDER — HEMOSTATIC AGENTS (NO CHARGE) OPTIME
TOPICAL | Status: DC | PRN
  Administered 2012-01-10: 1 via TOPICAL

## 2012-01-10 MED ORDER — SODIUM CHLORIDE 0.9 % IJ SOLN: 3.0000 mL | INTRAMUSCULAR | Status: DC | PRN

## 2012-01-10 MED ORDER — MORPHINE SULFATE 2 MG/ML IJ SOLN: 1.0000 mg | INTRAMUSCULAR | Status: DC | PRN

## 2012-01-10 MED ORDER — SODIUM CHLORIDE 0.9 % IR SOLN
Status: DC | PRN
  Administered 2012-01-10: 16:00:00

## 2012-01-10 MED ORDER — LISINOPRIL 20 MG PO TABS
20.0000 mg | ORAL_TABLET | Freq: Every day | ORAL | Status: DC
  Administered 2012-01-11: 20 mg via ORAL
  Filled 2012-01-10: qty 1

## 2012-01-10 MED ORDER — FUROSEMIDE 40 MG PO TABS
40.0000 mg | ORAL_TABLET | Freq: Every day | ORAL | Status: DC | PRN
  Filled 2012-01-10: qty 1

## 2012-01-10 MED ORDER — ROCURONIUM BROMIDE 100 MG/10ML IV SOLN
INTRAVENOUS | Status: DC | PRN
  Administered 2012-01-10: 30 mg via INTRAVENOUS

## 2012-01-10 MED ORDER — ACETAMINOPHEN 650 MG RE SUPP: 650.0000 mg | RECTAL | Status: DC | PRN

## 2012-01-10 MED ORDER — CEFAZOLIN SODIUM 1-5 GM-% IV SOLN
1.0000 g | Freq: Three times a day (TID) | INTRAVENOUS | Status: AC
  Administered 2012-01-10 – 2012-01-11 (×2): 1 g via INTRAVENOUS
  Filled 2012-01-10 (×3): qty 50

## 2012-01-10 MED ORDER — ACETAMINOPHEN 10 MG/ML IV SOLN
1000.0000 mg | Freq: Four times a day (QID) | INTRAVENOUS | Status: DC
  Administered 2012-01-10 – 2012-01-11 (×3): 1000 mg via INTRAVENOUS
  Filled 2012-01-10 (×4): qty 100

## 2012-01-10 MED ORDER — GLYCOPYRROLATE 0.2 MG/ML IJ SOLN
INTRAMUSCULAR | Status: DC | PRN
  Administered 2012-01-10: .6 mg via INTRAVENOUS

## 2012-01-10 MED ORDER — ZOLPIDEM TARTRATE 5 MG PO TABS: 5.0000 mg | ORAL_TABLET | Freq: Every evening | ORAL | Status: DC | PRN

## 2012-01-10 MED ORDER — LIDOCAINE HCL (CARDIAC) 20 MG/ML IV SOLN
INTRAVENOUS | Status: DC | PRN
  Administered 2012-01-10: 80 mg via INTRAVENOUS

## 2012-01-10 MED ORDER — LACTATED RINGERS IV SOLN
INTRAVENOUS | Status: DC | PRN
  Administered 2012-01-10: 15:00:00 via INTRAVENOUS

## 2012-01-10 MED ORDER — METFORMIN HCL 500 MG PO TABS
500.0000 mg | ORAL_TABLET | Freq: Four times a day (QID) | ORAL | Status: DC
  Administered 2012-01-10 – 2012-01-11 (×2): 500 mg via ORAL
  Filled 2012-01-10 (×5): qty 1

## 2012-01-10 MED ORDER — METHOCARBAMOL 100 MG/ML IJ SOLN
500.0000 mg | Freq: Four times a day (QID) | INTRAVENOUS | Status: DC | PRN
  Filled 2012-01-10: qty 5

## 2012-01-10 MED ORDER — HYDROCODONE-ACETAMINOPHEN 5-325 MG PO TABS: 1.0000 | ORAL_TABLET | ORAL | Status: DC | PRN

## 2012-01-10 MED ORDER — NITROGLYCERIN 0.4 MG SL SUBL: 0.4000 mg | SUBLINGUAL_TABLET | SUBLINGUAL | Status: DC | PRN

## 2012-01-10 MED ORDER — ETOMIDATE 2 MG/ML IV SOLN
INTRAVENOUS | Status: DC | PRN
  Administered 2012-01-10: 16 mg via INTRAVENOUS

## 2012-01-10 MED ORDER — HYDROMORPHONE HCL PF 1 MG/ML IJ SOLN: 0.2500 mg | INTRAMUSCULAR | Status: DC | PRN

## 2012-01-10 MED ORDER — LISINOPRIL-HYDROCHLOROTHIAZIDE 20-25 MG PO TABS: 1.0000 | ORAL_TABLET | Freq: Every day | ORAL | Status: DC

## 2012-01-10 MED ORDER — PHENOL 1.4 % MT LIQD: 1.0000 | OROMUCOSAL | Status: DC | PRN

## 2012-01-10 MED ORDER — CARVEDILOL 25 MG PO TABS
37.5000 mg | ORAL_TABLET | Freq: Every day | ORAL | Status: DC
  Administered 2012-01-11: 37.5 mg via ORAL
  Filled 2012-01-10 (×2): qty 1

## 2012-01-10 MED ORDER — MIDAZOLAM HCL 5 MG/5ML IJ SOLN
INTRAMUSCULAR | Status: DC | PRN
  Administered 2012-01-10: 2 mg via INTRAVENOUS

## 2012-01-10 MED ORDER — NEOSTIGMINE METHYLSULFATE 1 MG/ML IJ SOLN
INTRAMUSCULAR | Status: DC | PRN
  Administered 2012-01-10: 4 mg via INTRAVENOUS

## 2012-01-10 MED ORDER — ONDANSETRON HCL 4 MG/2ML IJ SOLN
INTRAMUSCULAR | Status: DC | PRN
  Administered 2012-01-10: 4 mg via INTRAVENOUS

## 2012-01-10 MED ORDER — BACITRACIN 50000 UNITS IM SOLR
INTRAMUSCULAR | Status: AC
  Filled 2012-01-10: qty 1

## 2012-01-10 MED ORDER — ASPIRIN EC 81 MG PO TBEC
81.0000 mg | DELAYED_RELEASE_TABLET | Freq: Every day | ORAL | Status: DC
  Administered 2012-01-11: 81 mg via ORAL
  Filled 2012-01-10: qty 1

## 2012-01-10 MED ORDER — DEXAMETHASONE SODIUM PHOSPHATE 10 MG/ML IJ SOLN
10.0000 mg | INTRAMUSCULAR | Status: AC
  Administered 2012-01-10: 10 mg via INTRAVENOUS

## 2012-01-10 MED ORDER — THROMBIN 5000 UNITS EX KIT
PACK | CUTANEOUS | Status: DC | PRN
  Administered 2012-01-10 (×2): 5000 [IU] via TOPICAL

## 2012-01-10 MED ORDER — DEXAMETHASONE SODIUM PHOSPHATE 10 MG/ML IJ SOLN
INTRAMUSCULAR | Status: AC
  Filled 2012-01-10: qty 1

## 2012-01-10 MED ORDER — EPHEDRINE SULFATE 50 MG/ML IJ SOLN
INTRAMUSCULAR | Status: DC | PRN
  Administered 2012-01-10 (×3): 10 mg via INTRAVENOUS

## 2012-01-10 MED ORDER — MENTHOL 3 MG MT LOZG: 1.0000 | LOZENGE | OROMUCOSAL | Status: DC | PRN

## 2012-01-10 MED ORDER — CARVEDILOL 25 MG PO TABS
25.0000 mg | ORAL_TABLET | Freq: Every day | ORAL | Status: DC
  Administered 2012-01-10: 25 mg via ORAL
  Filled 2012-01-10 (×2): qty 1

## 2012-01-10 SURGICAL SUPPLY — 52 items
BAG DECANTER FOR FLEXI CONT (MISCELLANEOUS) ×2 IMPLANT
BENZOIN TINCTURE PRP APPL 2/3 (GAUZE/BANDAGES/DRESSINGS) ×2 IMPLANT
BUR MATCHSTICK NEURO 3.0 LAGG (BURR) ×2 IMPLANT
CANISTER SUCTION 2500CC (MISCELLANEOUS) ×2 IMPLANT
CLOTH BEACON ORANGE TIMEOUT ST (SAFETY) ×2 IMPLANT
CONT SPEC 4OZ CLIKSEAL STRL BL (MISCELLANEOUS) ×2 IMPLANT
DRAPE LAPAROTOMY 100X72X124 (DRAPES) ×2 IMPLANT
DRAPE MICROSCOPE LEICA (MISCELLANEOUS) ×2 IMPLANT
DRAPE MICROSCOPE ZEISS OPMI (DRAPES) IMPLANT
DRAPE POUCH INSTRU U-SHP 10X18 (DRAPES) ×2 IMPLANT
DRAPE SURG 17X23 STRL (DRAPES) ×2 IMPLANT
DRESSING TELFA 8X3 (GAUZE/BANDAGES/DRESSINGS) ×2 IMPLANT
DRSG OPSITE 4X5.5 SM (GAUZE/BANDAGES/DRESSINGS) ×2 IMPLANT
DURAPREP 26ML APPLICATOR (WOUND CARE) ×2 IMPLANT
ELECT REM PT RETURN 9FT ADLT (ELECTROSURGICAL) ×2
ELECTRODE REM PT RTRN 9FT ADLT (ELECTROSURGICAL) ×1 IMPLANT
EVACUATOR 1/8 PVC DRAIN (DRAIN) ×2 IMPLANT
GAUZE SPONGE 4X4 16PLY XRAY LF (GAUZE/BANDAGES/DRESSINGS) IMPLANT
GLOVE BIO SURGEON STRL SZ8 (GLOVE) ×2 IMPLANT
GLOVE BIOGEL PI IND STRL 7.5 (GLOVE) ×1 IMPLANT
GLOVE BIOGEL PI IND STRL 8.5 (GLOVE) ×1 IMPLANT
GLOVE BIOGEL PI INDICATOR 7.5 (GLOVE) ×1
GLOVE BIOGEL PI INDICATOR 8.5 (GLOVE) ×1
GLOVE ECLIPSE 7.5 STRL STRAW (GLOVE) ×2 IMPLANT
GLOVE SURG SS PI 8.0 STRL IVOR (GLOVE) ×2 IMPLANT
GOWN BRE IMP SLV AUR LG STRL (GOWN DISPOSABLE) IMPLANT
GOWN BRE IMP SLV AUR XL STRL (GOWN DISPOSABLE) ×6 IMPLANT
GOWN STRL REIN 2XL LVL4 (GOWN DISPOSABLE) ×2 IMPLANT
HEMOSTAT POWDER KIT SURGIFOAM (HEMOSTASIS) IMPLANT
KIT BASIN OR (CUSTOM PROCEDURE TRAY) ×2 IMPLANT
KIT ROOM TURNOVER OR (KITS) ×2 IMPLANT
NEEDLE BONE MARROW 8GAX6 (NEEDLE) ×2 IMPLANT
NEEDLE HYPO 25X1 1.5 SAFETY (NEEDLE) ×2 IMPLANT
NEEDLE SPNL 20GX3.5 QUINCKE YW (NEEDLE) ×2 IMPLANT
NS IRRIG 1000ML POUR BTL (IV SOLUTION) ×2 IMPLANT
PACK FOAM VITOSS 5CC (Orthopedic Implant) ×2 IMPLANT
PACK LAMINECTOMY NEURO (CUSTOM PROCEDURE TRAY) ×2 IMPLANT
PAD ARMBOARD 7.5X6 YLW CONV (MISCELLANEOUS) ×6 IMPLANT
PLATE AFFIX 55MM (Plate) ×2 IMPLANT
RUBBERBAND STERILE (MISCELLANEOUS) ×4 IMPLANT
SPONGE SURGIFOAM ABS GEL SZ50 (HEMOSTASIS) ×2 IMPLANT
STRIP CLOSURE SKIN 1/2X4 (GAUZE/BANDAGES/DRESSINGS) ×2 IMPLANT
SUT VIC AB 0 CT1 18XCR BRD8 (SUTURE) ×1 IMPLANT
SUT VIC AB 0 CT1 8-18 (SUTURE) ×1
SUT VIC AB 2-0 CP2 18 (SUTURE) ×2 IMPLANT
SUT VIC AB 3-0 SH 8-18 (SUTURE) ×2 IMPLANT
SYR 20ML ECCENTRIC (SYRINGE) ×2 IMPLANT
SYR BULB IRRIGATION 50ML (SYRINGE) ×2 IMPLANT
SYR CONTROL 10ML LL (SYRINGE) ×2 IMPLANT
TOWEL OR 17X24 6PK STRL BLUE (TOWEL DISPOSABLE) ×2 IMPLANT
TOWEL OR 17X26 10 PK STRL BLUE (TOWEL DISPOSABLE) ×2 IMPLANT
WATER STERILE IRR 1000ML POUR (IV SOLUTION) ×2 IMPLANT

## 2012-01-10 NOTE — Progress Notes (Signed)
Spoke with dr. Ronnald Ramp,  Who told me that Dr. Orene Desanctis saw patient on Monday and that written cardiac clearance were being sent  (I called the office and requested a copy of this clearance)   I let Dr Percell Miller and Sigmund Hazel PA  Know this latest info... da

## 2012-01-10 NOTE — Anesthesia Postprocedure Evaluation (Signed)
  Anesthesia Post-op Note  Patient: Alan [name]  Procedure(s) Performed: Procedure(s) (LRB) with comments: LUMBAR LAMINECTOMY/DECOMPRESSION MICRODISCECTOMY 1 LEVEL (Bilateral) - Lumbar three-four decompression, Posterior lateral fusion lumbar three-four, posterior spinus plate lumbar three-four  Patient Location: PACU  Anesthesia Type:General  Level of Consciousness: awake  Airway and Oxygen Therapy: Patient Spontanous Breathing  Post-op Pain: mild  Post-op Assessment: Post-op Vital signs reviewed  Post-op Vital Signs: Reviewed  Complications: No apparent anesthesia complications

## 2012-01-10 NOTE — Op Note (Signed)
01/10/2012  5:28 PM  PATIENT:  Alan [name]  71 y.o. 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 Posterior lateral arthrodesis L3-4 on the left utilizing local autograft and morcellized allograft soaked with a bone marrow aspirate obtained through a separate fascial incision over the left iliac crest, 2. Posterior decompressive laminectomy, bilateral medial facetectomy and foraminotomies L3-4, 3. Nonsegmental fixation L3-4  SURGEON:  Sherley Bounds, MD  ASSISTANTS: Dr. Saintclair Halsted  ANESTHESIA:   General  EBL: 150 ml  Total I/O In: 2050 [I.V.:1800; IV Piggyback:250] Out: 150 [Blood:150]  BLOOD ADMINISTERED:none  DRAINS: Medium Hemovac   SPECIMEN:  No Specimen  INDICATION FOR PROCEDURE: This is a gentleman who presented with a long history of back pain. He had claudication when walking. CT myelography severe spinal stenosis at L3-4 with a 1 mm anterolisthesis of L3 on L4. He had multilevel spondylosis. Recommended a decompressive laminectomy at L3-4. Patient understood the risks, benefits, and alternatives and potential outcomes and wished to proceed. At the time of surgery it was noted that the interspinous ligament was disrupted at L3-4, and I felt he had significant instability at that level. Therefore the decision was made intraoperatively to perform a posterior lateral fusion at L3 4 and place a interspinous plate to reestablish his posterior tension band.  PROCEDURE DETAILS: The patient was taken to the operating room and after induction of adequate generalized endotracheal anesthesia, the patient was rolled into the prone position on the Wilson frame and all pressure points were padded. The lumbar region was cleaned and then prepped with DuraPrep and draped in the usual sterile fashion. 5 cc of local  anesthesia was injected and then a dorsal midline incision was made and carried down to the lumbo sacral fascia. The fascia was opened and the paraspinous musculature was taken down in a subperiosteal fashion to expose L3-4 bilaterally. The interspinous ligament at this level was disrupted. I pulled on the spinous process of L3 and felt that there was instability at this level. Therefore I decided it was important to move forward with posterior lateral fusion and plating in addition to the planned decompressive laminectomy. Intraoperative x-ray confirmed my level at L3-4, and then I used a combination of the high-speed drill and the Kerrison punches to perform a hemilaminectomy, medial facetectomy, and foraminotomy at L3-4 bilaterally. The underlying yellow ligament was opened and removed in a piecemeal fashion to expose the underlying dura and exiting nerve roots. I undercut the lateral recess and dissected down until I was medial to and distal to the pedicle. The nerve root was well decompressed bilaterally. I then palpated with a coronary dilator along the nerve root and into the foramen to assure adequate decompression. I felt no more compression of the nerve root. I then opened the fascia over the left iliac crest and used a Jamshidi needle to remove about 10 cc of bone marrow aspirate. I used a bone marrow aspirate to soak a morcellized allograft. I drilled the transverse processes of L3 and L4 on the left as well as the lateral facet, and then packed this area with local autograft and the morcellized allograft. I then measured for the interspinous plate. The large trial fit the best. I used a large interspinous plate and squeezed it tightly onto the spinous processes of L3 and L4. An x-ray was done to  assure appropriate placement. I irrigated with saline solution containing bacitracin. Achieved hemostasis with bipolar cautery, lined the dura with Gelfoam, and then closed the fascia with 0 Vicryl. I closed the  subcutaneous tissues with 2-0 Vicryl and the subcuticular tissues with 3-0 Vicryl. The skin was then closed with benzoin and Steri-Strips. The drapes were removed, a sterile dressing was applied. The patient was awakened from general anesthesia and transferred to the recovery room in stable condition. At the end of the procedure all sponge, needle and instrument counts were correct.   PLAN OF CARE: Admit to inpatient   PATIENT DISPOSITION:  PACU - hemodynamically stable.   Delay start of Pharmacological VTE agent (>24hrs) due to surgical blood loss or risk of bleeding:  yes

## 2012-01-10 NOTE — Transfer of Care (Signed)
Immediate Anesthesia Transfer of Care Note  Patient: Alan [name]  Procedure(s) Performed: Procedure(s) (LRB) with comments: LUMBAR LAMINECTOMY/DECOMPRESSION MICRODISCECTOMY 1 LEVEL (Bilateral) - Lumbar three-four decompression, Posterior lateral fusion lumbar three-four, posterior spinus plate lumbar three-four  Patient Location: PACU  Anesthesia Type:General  Level of Consciousness: awake, alert , oriented and patient cooperative  Airway & Oxygen Therapy: Patient Spontanous Breathing and Patient connected to face mask oxygen  Post-op Assessment: Report given to PACU RN, Post -op Vital signs reviewed and stable and Patient moving all extremities  Post vital signs: Reviewed and stable  Complications: No apparent anesthesia complications

## 2012-01-10 NOTE — Anesthesia Postprocedure Evaluation (Signed)
  Anesthesia Post-op Note  Patient: ALGOT DENNINGTON  Procedure(s) Performed: Procedure(s) (LRB) with comments: LUMBAR LAMINECTOMY/DECOMPRESSION MICRODISCECTOMY 1 LEVEL (Bilateral) - Lumbar three-four decompression, Posterior lateral fusion lumbar three-four, posterior spinus plate lumbar three-four  Patient Location: PACU  Anesthesia Type:General  Level of Consciousness: awake  Airway and Oxygen Therapy: Patient Spontanous Breathing  Post-op Pain: mild  Post-op Assessment: Post-op Vital signs reviewed  Post-op Vital Signs: Reviewed  Complications: No apparent anesthesia complications

## 2012-01-10 NOTE — H&P (Signed)
Subjective: Patient is a 71 y.o. male admitted for decompressive laminectomy for stenosis. Onset of symptoms was several years ago, gradually worsening since that time.  The pain is rated severe, and is located at the across the lower back and radiates to legs. The pain is described as aching and occurs intermittently. The symptoms have been progressive. Symptoms are exacerbated by exercise. MRI or CT showed multilevel spondylosis with stenosis L2-3 and L3-4.   Past Medical History  Diagnosis Date  . Diabetes mellitus   . A-fib   . Complete heart block   . Coronary artery disease     seen at [facility] cardiology   . Hypertension     Dr. Veronia Beets, primary  . Sleep apnea     cpap, 11, last sleep study Jan2013, sees Dr. Keturah Barre  . Neuromuscular disorder     carpal tunnel bilaterally  . Polio     hx of  . Chronic back pain     Past Surgical History  Procedure Date  . Coronary artery bypass graft 1990  . Stents   . Coronary artery bypass graft 2004  . Maze procedure 2004  . Pacemaker insertion   . Back surgery   . Cardiac catheterization   . Tonsillectomy   . Appendectomy   . Hernia repair     Prior to Admission medications   Medication Sig Start Date End Date Taking? Authorizing Provider  amLODipine (NORVASC) 10 MG tablet Take 5 mg by mouth 2 (two) times daily.   Yes Historical Provider, MD  aspirin EC 81 MG tablet Take 81 mg by mouth daily.   Yes Historical Provider, MD  carvedilol (COREG) 25 MG tablet Take 25-37.5 mg by mouth 2 (two) times daily with a meal. Takes 37.5 mg in the morning and 25 mg in the evening   Yes Historical Provider, MD  Chromium-Cinnamon (CINNAMON PLUS CHROMIUM PO) Take 1 tablet by mouth daily.   Yes Historical Provider, MD  Coenzyme Q10 (CO Q 10) 10 MG CAPS Take 1 capsule by mouth daily.   Yes Historical Provider, MD  Fish Oil-Cholecalciferol (FISH OIL-VITAMIN D) 1200-1000 MG-UNIT CAPS Take 1 tablet by mouth daily.   Yes Historical Provider, MD   Flaxseed, Linseed, 1300 MG CAPS Take 1,300 mg by mouth daily.   Yes Historical Provider, MD  furosemide (LASIX) 40 MG tablet Take 40 mg by mouth daily as needed. For swelling   Yes Historical Provider, MD  glipiZIDE (GLUCOTROL) 10 MG tablet Take 10 mg by mouth daily as needed. For high blood sugars   Yes Historical Provider, MD  insulin glargine (LANTUS) 100 UNIT/ML injection Inject 30 Units into the skin at bedtime as needed. For elevated blood sugars   Yes Historical Provider, MD  lisinopril-hydrochlorothiazide (PRINZIDE,ZESTORETIC) 20-25 MG per tablet Take 1 tablet by mouth daily.   Yes Historical Provider, MD  metFORMIN (GLUCOPHAGE) 500 MG tablet Take 500 mg by mouth 4 (four) times daily. 12/21/10  Yes Erlene Quan, PA  Multiple Vitamin (MULTIVITAMIN WITH MINERALS) TABS Take 1 tablet by mouth daily.   Yes Historical Provider, MD  nabumetone (RELAFEN) 750 MG tablet Take 750 mg by mouth daily as needed. For pain   Yes Historical Provider, MD  warfarin (COUMADIN) 5 MG tablet Take 5 mg by mouth daily.   Yes Historical Provider, MD  zolpidem (AMBIEN) 10 MG tablet Take 5 mg by mouth at bedtime as needed. For sleep   Yes Historical Provider, MD  nitroGLYCERIN (NITROSTAT) 0.4 MG SL  tablet Place 0.4 mg under the tongue every 5 (five) minutes as needed. For chest pain    Historical Provider, MD   No Known Allergies  History  Substance Use Topics  . Smoking status: Never Smoker   . Smokeless tobacco: Not on file  . Alcohol Use: No    Family History  Problem Relation Age of Onset  . Other Father     MVA  . Cancer Mother      Review of Systems  Positive ROS: Negative  All other systems have been reviewed and were otherwise negative with the exception of those mentioned in the HPI and as above.  Objective: Vital signs in last 24 hours: Temp:  [98.2 F (36.8 C)] 98.2 F (36.8 C) (12/04 1316) Pulse Rate:  [64] 64  (12/04 1316) Resp:  [18] 18  (12/04 1316) BP: (160)/(83) 160/83 mmHg  (12/04 1316) SpO2:  [98 %] 98 % (12/04 1316)  General Appearance: Alert, cooperative, no distress, appears stated age Head: Normocephalic, without obvious abnormality, atraumatic Eyes: PERRL, conjunctiva/corneas clear, EOM's intact, fundi benign, both eyes      Ears: Normal TM's and external ear canals, both ears Throat: Lips, mucosa, and tongue normal; teeth and gums normal Neck: Supple, symmetrical, trachea midline, no adenopathy; thyroid: No enlargement/tenderness/nodules; no carotid bruit or JVD Back: Symmetric, no curvature, ROM normal, no CVA tenderness Lungs: Clear to auscultation bilaterally, respirations unlabored Heart: Regular rate and rhythm, S1 and S2 normal, no murmur, rub or gallop Abdomen: Soft, non-tender, bowel sounds active all four quadrants, no masses, no organomegaly Extremities: Extremities normal, atraumatic, no cyanosis or edema Pulses: 2+ and symmetric all extremities Skin: Skin color, texture, turgor normal, no rashes or lesions  NEUROLOGIC:   Mental status: Alert and oriented x4,  no aphasia, good attention span, fund of knowledge, and memory Motor Exam - grossly normal Sensory Exam - grossly normal Reflexes: 1+ Coordination - grossly normal Gait - grossly normal Balance - grossly normal Cranial Nerves: I: smell Not tested  II: visual acuity  OS: nl    OD: nl  II: visual fields Full to confrontation  II: pupils Equal, round, reactive to light  III,VII: ptosis None  III,IV,VI: extraocular muscles  Full ROM  V: mastication Normal  V: facial light touch sensation  Normal  V,VII: corneal reflex  Present  VII: facial muscle function - upper  Normal  VII: facial muscle function - lower Normal  VIII: hearing Not tested  IX: soft palate elevation  Normal  IX,X: gag reflex Present  XI: trapezius strength  5/5  XI: sternocleidomastoid strength 5/5  XI: neck flexion strength  5/5  XII: tongue strength  Normal    Data Review Lab Results  Component  Value Date   WBC 5.5 01/03/2012   HGB 13.2 01/03/2012   HCT 38.6* 01/03/2012   MCV 89.6 01/03/2012   PLT 154 01/03/2012   Lab Results  Component Value Date   NA 136 01/03/2012   K 4.2 01/03/2012   CL 100 01/03/2012   CO2 26 01/03/2012   BUN 28* 01/03/2012   CREATININE 1.17 01/03/2012   GLUCOSE 164* 01/03/2012   Lab Results  Component Value Date   INR 1.96* 01/03/2012    Assessment/Plan: Patient admitted for decompressive laminectomy L3-4 and L2-3 for lumbar spinal stenosis. Patient has failed conservative therapy.  I explained the condition and procedure to the patient and answered any questions.  Patient wishes to proceed with procedure as planned. Understands risks/ benefits and typical  outcomes of procedure. He understands he is a high risk for cardiac event despite his cardiac clearance, and he fully accepts this risk. He states, "I cannot continue to live like this so I'll take the risk." He understands that the likelihood of resolution of his chronic back pain is small, and that the goal of the surgery is to try to improve his lower extremity pain with ambulation allowing him to become more active.   Renley Banwart S 01/10/2012 2:35 PM

## 2012-01-10 NOTE — Preoperative (Signed)
Beta Blockers   Reason not to administer Beta Blockers:Not Applicable 

## 2012-01-10 NOTE — Anesthesia Procedure Notes (Signed)
Procedure Name: Intubation Date/Time: 01/10/2012 3:21 PM Performed by: Carney Living Pre-anesthesia Checklist: Patient identified, Emergency Drugs available, Suction available, Patient being monitored and Timeout performed Patient Re-evaluated:Patient Re-evaluated prior to inductionOxygen Delivery Method: Circle system utilized Preoxygenation: Pre-oxygenation with 100% oxygen Intubation Type: IV induction Ventilation: Mask ventilation without difficulty and Oral airway inserted - appropriate to patient size Laryngoscope Size: Mac and 4 Grade View: Grade II Tube type: Oral Tube size: 7.5 mm Number of attempts: 1 Airway Equipment and Method: Stylet Placement Confirmation: ETT inserted through vocal cords under direct vision,  positive ETCO2 and breath sounds checked- equal and bilateral Secured at: 22 cm Tube secured with: Tape Dental Injury: Teeth and Oropharynx as per pre-operative assessment

## 2012-01-10 NOTE — Anesthesia Preprocedure Evaluation (Addendum)
Anesthesia Evaluation  Patient identified by MRN, date of birth, ID band Patient awake    Reviewed: Allergy & Precautions, H&P , NPO status , Patient's Chart, lab work & pertinent test results  Airway Mallampati: II TM Distance: >3 FB Neck ROM: Full    Dental  (+) Dental Advisory Given   Pulmonary shortness of breath and with exertion, sleep apnea and Continuous Positive Airway Pressure Ventilation ,  breath sounds clear to auscultation        Cardiovascular hypertension, Pt. on medications and Pt. on home beta blockers + angina with exertion + CAD + dysrhythmias Rhythm:Regular Rate:Normal  Dr. Ronnald Ramp discussed case with cardiologist of record today and obtained cardiac clearance.    Neuro/Psych    GI/Hepatic negative GI ROS,   Endo/Other  diabetes, Type 2, Oral Hypoglycemic Agents  Renal/GU negative Renal ROS     Musculoskeletal   Abdominal   Peds  Hematology   Anesthesia Other Findings   Reproductive/Obstetrics                        Anesthesia Physical Anesthesia Plan  ASA: III  Anesthesia Plan: General   Post-op Pain Management:    Induction: Intravenous  Airway Management Planned: Oral ETT  Additional Equipment:   Intra-op Plan:   Post-operative Plan: Possible Post-op intubation/ventilation  Informed Consent: I have reviewed the patients History and Physical, chart, labs and discussed the procedure including the risks, benefits and alternatives for the proposed anesthesia with the patient or authorized representative who has indicated his/her understanding and acceptance.   Dental advisory given  Plan Discussed with: CRNA, Anesthesiologist and Surgeon  Anesthesia Plan Comments:       Anesthesia Quick Evaluation

## 2012-01-11 ENCOUNTER — Encounter (HOSPITAL_COMMUNITY): Payer: Self-pay | Admitting: Neurological Surgery

## 2012-01-11 NOTE — Progress Notes (Signed)
UR COMPLETED  

## 2012-01-11 NOTE — Discharge Summary (Signed)
Physician Discharge Summary  Patient ID: Alan [name] MRN: [id number] DOB/AGE: [birth year] 71 y.o.  Admit date: 01/10/2012 Discharge date: 01/11/2012  Admission Diagnoses: lumbar stenosis    Discharge Diagnoses: same with instability   Discharged Condition: good  Hospital Course: The patient was admitted on 01/10/2012 and taken to the operating room where the patient underwent decompression and fusion L3-4. The patient tolerated the procedure well and was taken to the recovery room and then to the ICU in stable condition. The hospital course was routine. There were no complications. The wound remained clean dry and intact. Pt had appropriate back soreness. No complaints of leg pain or new N/T/W. The patient remained afebrile with stable vital signs, and tolerated a regular diet. The patient continued to increase activities, and pain was well controlled with oral pain medications.   Consults: None  Significant Diagnostic Studies:  Results for orders placed during the hospital encounter of 01/10/12  APTT      Component Value Range   aPTT 28  24 - 37 seconds  PROTIME-INR      Component Value Range   Prothrombin Time 13.7  11.6 - 15.2 seconds   INR 1.06  0.00 - 1.49  GLUCOSE, CAPILLARY      Component Value Range   Glucose-Capillary 112 (*) 70 - 99 mg/dL  GLUCOSE, CAPILLARY      Component Value Range   Glucose-Capillary 114 (*) 70 - 99 mg/dL    Chest 2 View  01/03/2012  *RADIOLOGY REPORT*  Clinical Data: Preoperative evaluation prior to lumbar spine surgery  CHEST - 2 VIEW  Comparison: Prior chest x-ray 12/20/2010  Findings: Unchanged enlargement the cardiopericardial silhouette and main pulmonary artery.  Left atrial enlargement noted.  Left subclavian approach cardiac rhythm maintenance device also unchanged position with leads projecting over the right atrium and right ventricle.  Status post median sternotomy. Negative for edema, focal airspace consolidation or suspicious  pulmonary nodule. No pleural effusion or pneumothorax.  No acute osseous abnormality.  IMPRESSION:  1.  No acute cardiopulmonary disease. 2.  Unchanged cardiomegaly including left atrial enlargement. 3.  Unchanged enlargement of the main pulmonary artery.   Original Report Authenticated By: Jacqulynn Cadet, M.D.    Dg Lumbar Spine 2-3 Views  01/10/2012  *RADIOLOGY REPORT*  Clinical Data: Lumbar decompression.  LUMBAR SPINE - 2-3 VIEW  Comparison: Lumbar myelogram dated 11/29/2011  Findings: Cross-table lateral films obtained intraoperatively initially show posterior localization at the L3-4 level. Additional film shows a posterior interspinous plate across the L3- 4 level.  IMPRESSION: Intraoperative films obtained during lumbar decompression at the L3- 4 level.   Original Report Authenticated By: Aletta Edouard, M.D.     Antibiotics:  Anti-infectives     Start     Dose/Rate Route Frequency Ordered Stop   01/10/12 2300   ceFAZolin (ANCEF) IVPB 1 g/50 mL premix        1 g 100 mL/hr over 30 Minutes Intravenous Every 8 hours 01/10/12 1831 01/11/12 0721   01/10/12 1540   bacitracin 50,000 Units in sodium chloride irrigation 0.9 % 500 mL irrigation  Status:  Discontinued          As needed 01/10/12 1557 01/10/12 1732   01/10/12 1440   bacitracin 50000 UNITS injection     Comments: DAY, DORY: cabinet override         01/10/12 1440 01/11/12 0244   01/10/12 0600   ceFAZolin (ANCEF) IVPB 2 g/50 mL premix  2 g 100 mL/hr over 30 Minutes Intravenous On call to O.R. 01/09/12 1424 01/10/12 1515          Discharge Exam: Blood pressure 142/53, pulse 62, temperature 98 F (36.7 C), temperature source Oral, resp. rate 21, height 5\' 10"  (1.778 m), weight 113 kg (249 lb 1.9 oz), SpO2 98.00%. Neurologic: Grossly normal Incision CDI  Discharge Medications:     Medication List     As of 01/11/2012 11:15 AM    TAKE these medications         amLODipine 10 MG tablet   Commonly known as:  NORVASC   Take 5 mg by mouth 2 (two) times daily.      aspirin EC 81 MG tablet   Take 81 mg by mouth daily.      carvedilol 25 MG tablet   Commonly known as: COREG   Take 25-37.5 mg by mouth 2 (two) times daily with a meal. Takes 37.5 mg in the morning and 25 mg in the evening      CINNAMON PLUS CHROMIUM PO   Take 1 tablet by mouth daily.      Co Q 10 10 MG Caps   Take 1 capsule by mouth daily.      Fish Oil-Vitamin D 1200-1000 MG-UNIT Caps   Take 1 tablet by mouth daily.      Flaxseed (Linseed) 1300 MG Caps   Take 1,300 mg by mouth daily.      furosemide 40 MG tablet   Commonly known as: LASIX   Take 40 mg by mouth daily as needed. For swelling      glipiZIDE 10 MG tablet   Commonly known as: GLUCOTROL   Take 10 mg by mouth daily as needed. For high blood sugars      insulin glargine 100 UNIT/ML injection   Commonly known as: LANTUS   Inject 30 Units into the skin at bedtime as needed. For elevated blood sugars      lisinopril-hydrochlorothiazide 20-25 MG per tablet   Commonly known as: PRINZIDE,ZESTORETIC   Take 1 tablet by mouth daily.      metFORMIN 500 MG tablet   Commonly known as: GLUCOPHAGE   Take 500 mg by mouth 4 (four) times daily.      multivitamin with minerals Tabs   Take 1 tablet by mouth daily.      nabumetone 750 MG tablet   Commonly known as: RELAFEN   Take 750 mg by mouth daily as needed. For pain      nitroGLYCERIN 0.4 MG SL tablet   Commonly known as: NITROSTAT   Place 0.4 mg under the tongue every 5 (five) minutes as needed. For chest pain      warfarin 5 MG tablet   Commonly known as: COUMADIN   Take 5 mg by mouth daily.      zolpidem 10 MG tablet   Commonly known as: AMBIEN   Take 5 mg by mouth at bedtime as needed. For sleep        Disposition: home  Final Dx: DLL/ Fusion L3-4      Discharge Orders    Future Orders Please Complete By Expires   Diet - low sodium heart healthy      Increase activity slowly      Discharge  instructions      Comments:   No strenuous activity, no  Bending or twisting, may shower   Call MD for:  temperature >100.4  Call MD for:  persistant nausea and vomiting      Call MD for:  severe uncontrolled pain      Call MD for:  redness, tenderness, or signs of infection (pain, swelling, redness, odor or green/yellow discharge around incision site)      Call MD for:  difficulty breathing, headache or visual disturbances      Call MD for:  persistant dizziness or light-headedness         Follow-up Information    Follow up with Bryony Kaman S, MD. Schedule an appointment as soon as possible for a visit in 2 weeks.   Contact information:   [address] [place] [address] [phone]           Signed: [name] 01/11/2012, 11:15 AM

## 2012-02-07 HISTORY — PX: NASAL SEPTUM SURGERY: SHX37

## 2012-02-27 ENCOUNTER — Other Ambulatory Visit: Payer: Self-pay | Admitting: *Deleted

## 2012-03-01 ENCOUNTER — Encounter (HOSPITAL_COMMUNITY): Payer: Self-pay | Admitting: Pharmacy Technician

## 2012-03-01 ENCOUNTER — Ambulatory Visit
Admission: RE | Admit: 2012-03-01 | Discharge: 2012-03-01 | Disposition: A | Discharge status: Home / Self Care | Payer: Medicare Other | Source: Ambulatory Visit | Attending: Cardiovascular Disease | Admitting: Cardiovascular Disease

## 2012-03-01 ENCOUNTER — Other Ambulatory Visit: Payer: Self-pay | Admitting: Cardiovascular Disease

## 2012-03-01 DIAGNOSIS — Z01811 Encounter for preprocedural respiratory examination: Secondary | ICD-10-CM

## 2012-03-06 MED ORDER — SODIUM CHLORIDE 0.9 % IR SOLN
80.0000 mg | Status: DC
  Filled 2012-03-06: qty 2

## 2012-03-06 MED ORDER — SODIUM CHLORIDE 0.45 % IV SOLN: INTRAVENOUS | Status: DC

## 2012-03-06 MED ORDER — SODIUM CHLORIDE 0.9 % IJ SOLN: 3.0000 mL | Freq: Two times a day (BID) | INTRAMUSCULAR | Status: DC

## 2012-03-06 MED ORDER — SODIUM CHLORIDE 0.9 % IV SOLN: 250.0000 mL | INTRAVENOUS | Status: DC

## 2012-03-06 MED ORDER — SODIUM CHLORIDE 0.9 % IJ SOLN: 3.0000 mL | INTRAMUSCULAR | Status: DC | PRN

## 2012-03-06 MED ORDER — CEFAZOLIN SODIUM-DEXTROSE 2-3 GM-% IV SOLR
2.0000 g | INTRAVENOUS | Status: DC
  Filled 2012-03-06: qty 50

## 2012-03-06 MED ORDER — CHLORHEXIDINE GLUCONATE 4 % EX LIQD
60.0000 mL | Freq: Once | CUTANEOUS | Status: DC
  Filled 2012-03-06: qty 60

## 2012-03-07 ENCOUNTER — Encounter (HOSPITAL_COMMUNITY)
Admission: RE | Disposition: A | Discharge status: Home / Self Care | Payer: Self-pay | Source: Ambulatory Visit | Attending: Cardiovascular Disease

## 2012-03-07 ENCOUNTER — Other Ambulatory Visit: Payer: Self-pay | Admitting: *Deleted

## 2012-03-07 ENCOUNTER — Encounter (HOSPITAL_COMMUNITY): Payer: Self-pay | Admitting: Family Medicine

## 2012-03-07 ENCOUNTER — Ambulatory Visit (HOSPITAL_COMMUNITY)
Admission: RE | Admit: 2012-03-07 | Discharge: 2012-03-08 | Disposition: A | Discharge status: Home / Self Care | Payer: Medicare Other | Source: Ambulatory Visit | Attending: Cardiovascular Disease | Admitting: Cardiovascular Disease

## 2012-03-07 DIAGNOSIS — I2510 Atherosclerotic heart disease of native coronary artery without angina pectoris: Secondary | ICD-10-CM | POA: Insufficient documentation

## 2012-03-07 DIAGNOSIS — I25718 Atherosclerosis of autologous vein coronary artery bypass graft(s) with other forms of angina pectoris: Secondary | ICD-10-CM

## 2012-03-07 DIAGNOSIS — G4733 Obstructive sleep apnea (adult) (pediatric): Secondary | ICD-10-CM | POA: Diagnosis present

## 2012-03-07 DIAGNOSIS — Z45018 Encounter for adjustment and management of other part of cardiac pacemaker: Secondary | ICD-10-CM | POA: Insufficient documentation

## 2012-03-07 DIAGNOSIS — Z950 Presence of cardiac pacemaker: Secondary | ICD-10-CM | POA: Diagnosis not present

## 2012-03-07 DIAGNOSIS — I4891 Unspecified atrial fibrillation: Secondary | ICD-10-CM | POA: Insufficient documentation

## 2012-03-07 DIAGNOSIS — E1122 Type 2 diabetes mellitus with diabetic chronic kidney disease: Secondary | ICD-10-CM | POA: Diagnosis present

## 2012-03-07 DIAGNOSIS — Z7901 Long term (current) use of anticoagulants: Secondary | ICD-10-CM | POA: Insufficient documentation

## 2012-03-07 DIAGNOSIS — B91 Sequelae of poliomyelitis: Secondary | ICD-10-CM

## 2012-03-07 DIAGNOSIS — E1169 Type 2 diabetes mellitus with other specified complication: Secondary | ICD-10-CM | POA: Diagnosis present

## 2012-03-07 DIAGNOSIS — I4819 Other persistent atrial fibrillation: Secondary | ICD-10-CM | POA: Diagnosis present

## 2012-03-07 DIAGNOSIS — E669 Obesity, unspecified: Secondary | ICD-10-CM | POA: Diagnosis present

## 2012-03-07 DIAGNOSIS — I442 Atrioventricular block, complete: Secondary | ICD-10-CM | POA: Insufficient documentation

## 2012-03-07 DIAGNOSIS — I472 Ventricular tachycardia: Secondary | ICD-10-CM | POA: Diagnosis present

## 2012-03-07 DIAGNOSIS — I4729 Other ventricular tachycardia: Secondary | ICD-10-CM | POA: Diagnosis present

## 2012-03-07 DIAGNOSIS — I495 Sick sinus syndrome: Secondary | ICD-10-CM | POA: Diagnosis present

## 2012-03-07 HISTORY — PX: PACEMAKER REVISION: SHX5482

## 2012-03-07 HISTORY — PX: INSERT / REPLACE / REMOVE PACEMAKER: SUR710

## 2012-03-07 HISTORY — DX: Long term (current) use of anticoagulants: Z79.01

## 2012-03-07 HISTORY — DX: Presence of cardiac pacemaker: Z95.0

## 2012-03-07 LAB — GLUCOSE, CAPILLARY: Glucose-Capillary: 112 mg/dL — ABNORMAL HIGH (ref 70–99)

## 2012-03-07 SURGERY — PACEMAKER REVISION
Anesthesia: LOCAL

## 2012-03-07 MED ORDER — ZOLPIDEM TARTRATE 5 MG PO TABS: 5.0000 mg | ORAL_TABLET | Freq: Every evening | ORAL | Status: DC | PRN

## 2012-03-07 MED ORDER — LISINOPRIL-HYDROCHLOROTHIAZIDE 20-25 MG PO TABS: 1.0000 | ORAL_TABLET | Freq: Every day | ORAL | Status: DC

## 2012-03-07 MED ORDER — CARVEDILOL 25 MG PO TABS
37.5000 mg | ORAL_TABLET | Freq: Every day | ORAL | Status: DC
  Filled 2012-03-07: qty 1

## 2012-03-07 MED ORDER — FENTANYL CITRATE 0.05 MG/ML IJ SOLN
INTRAMUSCULAR | Status: AC
  Filled 2012-03-07: qty 2

## 2012-03-07 MED ORDER — CEFAZOLIN SODIUM 1-5 GM-% IV SOLN
1.0000 g | Freq: Four times a day (QID) | INTRAVENOUS | Status: DC
  Administered 2012-03-07 – 2012-03-08 (×2): 1 g via INTRAVENOUS
  Filled 2012-03-07 (×3): qty 50

## 2012-03-07 MED ORDER — ASPIRIN EC 81 MG PO TBEC
81.0000 mg | DELAYED_RELEASE_TABLET | Freq: Every day | ORAL | Status: DC
  Filled 2012-03-07: qty 1

## 2012-03-07 MED ORDER — HEPARIN (PORCINE) IN NACL 2-0.9 UNIT/ML-% IJ SOLN
INTRAMUSCULAR | Status: AC
  Filled 2012-03-07: qty 500

## 2012-03-07 MED ORDER — LISINOPRIL 20 MG PO TABS
20.0000 mg | ORAL_TABLET | Freq: Every day | ORAL | Status: DC
  Filled 2012-03-07: qty 1

## 2012-03-07 MED ORDER — HYDROCHLOROTHIAZIDE 25 MG PO TABS
25.0000 mg | ORAL_TABLET | Freq: Every day | ORAL | Status: DC
  Filled 2012-03-07: qty 1

## 2012-03-07 MED ORDER — SODIUM CHLORIDE 0.9 % IV SOLN: INTRAVENOUS | Status: AC

## 2012-03-07 MED ORDER — CARVEDILOL 25 MG PO TABS: 25.0000 mg | ORAL_TABLET | Freq: Two times a day (BID) | ORAL | Status: DC

## 2012-03-07 MED ORDER — NITROGLYCERIN 0.4 MG SL SUBL: 0.4000 mg | SUBLINGUAL_TABLET | SUBLINGUAL | Status: DC | PRN

## 2012-03-07 MED ORDER — LIDOCAINE HCL (PF) 1 % IJ SOLN
INTRAMUSCULAR | Status: AC
  Filled 2012-03-07: qty 60

## 2012-03-07 MED ORDER — WARFARIN - PHYSICIAN DOSING INPATIENT: Freq: Every day | Status: DC

## 2012-03-07 MED ORDER — WARFARIN SODIUM 5 MG PO TABS
5.0000 mg | ORAL_TABLET | Freq: Every day | ORAL | Status: DC
  Administered 2012-03-07: 5 mg via ORAL
  Filled 2012-03-07 (×2): qty 1

## 2012-03-07 MED ORDER — INSULIN GLARGINE 100 UNIT/ML ~~LOC~~ SOLN
30.0000 [IU] | Freq: Every day | SUBCUTANEOUS | Status: DC
  Administered 2012-03-07: 30 [IU] via SUBCUTANEOUS

## 2012-03-07 MED ORDER — HYDROCODONE-ACETAMINOPHEN 5-325 MG PO TABS
1.0000 | ORAL_TABLET | ORAL | Status: DC | PRN
  Administered 2012-03-08: 1 via ORAL
  Filled 2012-03-07: qty 2

## 2012-03-07 MED ORDER — MUPIROCIN 2 % EX OINT
1.0000 "application " | TOPICAL_OINTMENT | Freq: Two times a day (BID) | CUTANEOUS | Status: DC
  Filled 2012-03-07: qty 22

## 2012-03-07 MED ORDER — MIDAZOLAM HCL 2 MG/2ML IJ SOLN
INTRAMUSCULAR | Status: AC
  Filled 2012-03-07: qty 2

## 2012-03-07 MED ORDER — AMLODIPINE BESYLATE 5 MG PO TABS
5.0000 mg | ORAL_TABLET | Freq: Two times a day (BID) | ORAL | Status: DC
  Filled 2012-03-07 (×3): qty 1

## 2012-03-07 MED ORDER — ONDANSETRON HCL 4 MG/2ML IJ SOLN: 4.0000 mg | Freq: Four times a day (QID) | INTRAMUSCULAR | Status: DC | PRN

## 2012-03-07 MED ORDER — FENTANYL CITRATE 0.05 MG/ML IJ SOLN: 25.0000 ug | INTRAMUSCULAR | Status: DC | PRN

## 2012-03-07 MED ORDER — CARVEDILOL 25 MG PO TABS
25.0000 mg | ORAL_TABLET | Freq: Every day | ORAL | Status: DC
  Administered 2012-03-07: 21:00:00 25 mg via ORAL
  Filled 2012-03-07 (×2): qty 1

## 2012-03-07 MED ORDER — ACETAMINOPHEN 325 MG PO TABS: 325.0000 mg | ORAL_TABLET | ORAL | Status: DC | PRN

## 2012-03-07 NOTE — H&P (Signed)
Date of Initial H&P: 02/24/2012  History reviewed, patient examined, no change in status, stable for surgery. Pacemaker dependent patient with device generator at [facility] and very poor ventricular lead sensing. Here for generator change and new ventricular lead.  This procedure has been fully reviewed with the patient and written informed consent has been obtained. Sanda Klein, MD, [place] [phone] office [phone] pager

## 2012-03-07 NOTE — CV Procedure (Signed)
Alan [name], Alan [name] Male, 72 y.o., 08/13/1940  Location: [facility]CATH LAB  Bed: NONE  MRN: [id number]  CSN: [id number]  Admit Dt: 03/07/12   Procedure report  Procedure performed:  1. Implantation of new ventricular permanent pacemaker lead 2. Generator changeout - dual chamber pacemaker 3. Pocket revision 4. Fluoroscopy 5.  Light sedation 6 . Left upper extremity venogram 7. AIGIS pouch  Reason for procedure: Symptomatic bradycardia due to: Sinus node arrest Ventricular lead sensing failure  Procedure performed by: Sanda Klein, MD  Complications: None  Estimated blood loss: <10 mL  Medications administered during procedure: Ancef 2 g intravenously Lidocaine 1% 30 mL locally,  Fentanyl 100 mcg intravenously Versed 3 mg intravenously Omnipaque 10 mL  Device details:  Generator Medtronic Adapta L, model ADDRL1,  serial number [id number] Right atrial lead medtronic 5076-52 serial number [id number] (CHRONIC) Right ventricular lead Medtronic [id number] serial number PJN [id number] (NEW) Chronic RV lead  medtronic [id number], serial number PJN [id number] (CAPPED) Old generator Medtronic EnRhythm [id number], serial number PJN [id number] H(EXPLANTED)  Procedure details:  After the risks and benefits of the procedure were discussed the patient provided informed consent and was brought to the cardiac cath lab in the fasting state. Due to venous collaterals seen in the skin of the chest, a venogram was performed via the left antecubital vein and confirmed subclavian vein patency. The patient was prepped and draped in usual sterile fashion. Local anesthesia with 1% lidocaine was administered to to the left infraclavicular area. A 5-6 cm horizontal incision was made parallel with and 2-3 cm caudal to the left clavicle, along an old scar. Using electrocautery and blunt dissection a prepectoral pocket was created down to the level of the pectoralis major muscle fascia, taking great care to avoid injury to the  chronic leads. The pocket was extensively revised due to extensive scar and to accommodate the antibiotic pouch. The pocket was carefully inspected for hemostasis. An antibiotic-soaked sponge was placed in the pocket.  Under fluoroscopic guidance and using the modified Seldinger technique a single venipuncture was performed to access the left subclavian vein. Little difficulty was encountered accessing the vein.  A J-tip guidewire was subsequently exchanged for a 7 [place] safe sheath.  Under fluoroscopic guidance the ventricular lead was advanced to level of the mid to apical right ventricular septum and thet active-fixation helix was deployed. Prominent current of injury was seen. Satisfactory pacing and sensing parameters were recorded. There was no evidence of diaphragmatic stimulation at maximum device output. The safe sheath was peeled away and the lead was secured in place with 2-0 silk.   The antibiotic-soaked sponge was removed from the pocket. The pocket was flushed with copious amounts of antibiotic solution. Reinspection showed excellent hemostasis.  The new ventricular lead was connected to new generator, after which the old generator was disconnected.  Using the programmer, the old right atrial lead was tested. Satisfactory  pacing and sensing parameters were recorded.  Subsequently the atrial lead was also connected to the generator. Repeat testing of the lead parameters later showed excellent values.  The device and loops of leads were inserted in the presoaked AIGIS pouch and the whole system was then placed carefully in the pocket,  with care been taking that the leads and device assumed a comfortable position without pressure on the incision. Great care was taken that the leads be located deep to the generator. The pocket was then closed in layers using 2 layers of 2-0 Vicryl and cutaneous staples, after which a sterile dressing  was applied.  At the end of the procedure the following  lead parameters were encountered:  Right atrial lead  sensed P waves none dtected, impedance 487 ohms, threshold 0.7 V at 0.5 ms pulse width.  Right ventricular lead sensed R waves 12.4 mV, impedance 1624 ohms, threshold 1.0 V at 0.5 ms pulse width.   Sanda Klein, MD, [place] [phone] office [phone] pager 03/07/2012 5:16 PM

## 2012-03-08 ENCOUNTER — Encounter (HOSPITAL_COMMUNITY): Payer: Self-pay | Admitting: Cardiology

## 2012-03-08 ENCOUNTER — Ambulatory Visit (HOSPITAL_COMMUNITY): Payer: Medicare Other

## 2012-03-08 DIAGNOSIS — Z950 Presence of cardiac pacemaker: Secondary | ICD-10-CM | POA: Diagnosis not present

## 2012-03-08 DIAGNOSIS — Z7901 Long term (current) use of anticoagulants: Secondary | ICD-10-CM

## 2012-03-08 HISTORY — DX: Long term (current) use of anticoagulants: Z79.01

## 2012-03-08 LAB — PROTIME-INR
INR: 1.89 — ABNORMAL HIGH (ref 0.00–1.49)
Prothrombin Time: 21 seconds — ABNORMAL HIGH (ref 11.6–15.2)

## 2012-03-08 MED ORDER — YOU HAVE A PACEMAKER BOOK
Freq: Once | Status: AC
  Administered 2012-03-08: 07:00:00
  Filled 2012-03-08: qty 1

## 2012-03-08 MED ORDER — HYDROCODONE-ACETAMINOPHEN 5-325 MG PO TABS: 1.0000 | ORAL_TABLET | ORAL | Status: DC | PRN

## 2012-03-08 NOTE — Discharge Summary (Signed)
Physician Discharge Summary  Patient ID: Alan [name] MRN: [id number] DOB/AGE: 1940-11-02 72 y.o.  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 Starke, serial number [id number]  Right atrial lead medtronic 5076-52 serial number [id number] (CHRONIC)  Right ventricular lead Medtronic [phone] serial number PJN [id number] (NEW)  Chronic RV lead medtronic [id number], serial number PJN [id number] (CAPPED)  Old generator Medtronic EnRhythm [id number], serial number PJN [id number] H(EXPLANTED)   Hospital Course: 60 YOWM was admitted electively for generator change on his medtronic pacemaker due to ERI.  Additionally his Rt ventricular lead was dysfunctional so new ventricular lead was placed.  Pt. Underwent procedure without complications.  He had small amount of oozing at pacer site, which was changed prior to discharge.    CXR without pneumothorax.  NO complaints, VS stable.  Pt seen by Dr. Sallyanne Kuster and stable for discharge.  Please see dx. Diagnosis for ongoing chronic problems.   Interrogation at discharge:                                                            [place]. IMPEDENCE                         466 ohms                                          1.048 ohms  Measured thres.                                0.625 V @ 0.40 ms                            0.375 V @ 0.40 ms on 03/08/12  Programmed output                         1.875 V/0.40 ms                                  3.500 V / 0.40 ms  Measured P/R wave                         >=  80%                                              16.0 to >22.4 mV  Programmed sensitivity                 0.50 mV                                                 5.60 mV    Consults: None;  Significant Diagnostic Studies: 2 View CXR 03/08/12 ; CHEST - 2 VIEW  Comparison: 03/01/2012  Findings: A third lead has been inserted. Generator has been  replaced. Heart size and pulmonary vascularity are at the upper  limits of normal. Lungs are clear. No pneumothorax. No  effusions. No acute osseous abnormality.  IMPRESSION:  Satisfactory appearance of the chest after pacemaker revision.     Discharge Exam: Blood pressure 114/62, pulse 61, temperature 97.9 F (36.6 C), temperature source Oral, resp. rate 20, height 5\' 10"  (1.778 m), weight 99.791 kg (220 lb), SpO2 94.00%.  AM exam:PE: General:alert and oriented Pacer site with minimal edema mild oozing under dressing  Heart:S1S2 RRR  Lungs:clear without rales rhonchi or wheezes  Abd:+ BS  Ext:no edema  Disposition: 01-Home or Self Care     Medication List     As of 03/08/2012  1:40 PM    TAKE these medications         amLODipine 10 MG tablet   Commonly known as: NORVASC   Take 5 mg by mouth 2 (two) times daily.      aspirin EC 81 MG tablet   Take 81 mg by mouth daily.      carvedilol 25 MG tablet   Commonly known as: COREG   Take 25-37.5 mg by mouth 2 (two) times daily with a meal. Takes 37.5mg  every morning and 25mg  every evening.      CINNAMON PLUS CHROMIUM PO   Take 1 tablet by mouth daily.      Co Q 10 10 MG Caps   Take 1 capsule by mouth daily.      Fish Oil-Vitamin D 1200-1000 MG-UNIT Caps   Take 1 tablet by mouth daily.      Flaxseed (Linseed) 1300 MG Caps   Take  1,300 mg by mouth daily.      furosemide 40 MG tablet   Commonly known as: LASIX   Take 40 mg by mouth daily as needed. For swelling      glipiZIDE 10 MG tablet   Commonly known as: GLUCOTROL   Take 10 mg by mouth daily as needed. For high blood sugars      HYDROcodone-acetaminophen 5-325 MG per tablet   Commonly known as: NORCO/VICODIN   Take 1-2 tablets by mouth every 4 (four) hours as needed for pain.      insulin glargine 100 UNIT/ML injection   Commonly known as: LANTUS   Inject 30 Units into the skin at bedtime as needed. For elevated blood sugars      lisinopril-hydrochlorothiazide 20-25 MG per tablet   Commonly known as: PRINZIDE,ZESTORETIC   Take 1 tablet by mouth daily.      metFORMIN 500 MG tablet   Commonly known as: GLUCOPHAGE  Take 500 mg by mouth 4 (four) times daily.      multivitamin with minerals Tabs   Take 1 tablet by mouth daily.      nabumetone 750 MG tablet   Commonly known as: RELAFEN   Take 750 mg by mouth daily as needed. For pain      nitroGLYCERIN 0.4 MG SL tablet   Commonly known as: NITROSTAT   Place 0.4 mg under the tongue every 5 (five) minutes as needed. For chest pain      warfarin 5 MG tablet   Commonly known as: COUMADIN   Take 5 mg by mouth daily.      zolpidem 10 MG tablet   Commonly known as: AMBIEN   Take 5 mg by mouth at bedtime as needed. For sleep           Follow-up Information    Follow up with [facility] R, NP. On 03/21/2012. (for staple removal,  at 1:40 pm)    Contact information:   [address] [place] [address] [phone]       Follow up with Sanda Klein, MD. On 03/12/2012. (for coumadin check at 11:40 am)    Contact information:   [address] [place] [address] [phone]         DISCHARGE INSTRUCTIONS: Heart Healthy Diabetic diet.  Hold Metformin until Sunday 03/10/12  Begin coumadin on Saturday 03/09/12   Supplemental Discharge Instructions for   Pacemaker Patients   Signed: Odean Fester R 03/08/2012, 1:40 PM

## 2012-03-08 NOTE — Progress Notes (Signed)
Elective gen change secondary to ERI and dysfunctional Rt ventricular lead. Symptomatic bradycardia due to: Sinus node arrest  Ventricular lead sensing failure   Subjective: No complaints  Objective: Vital signs in last 24 hours: Temp:  [97.5 F (36.4 C)-98.4 F (36.9 C)] 97.9 F (36.6 C) (01/31 0808) Pulse Rate:  [59-61] 61  (01/31 0808) Resp:  [15-21] 20  (01/31 0808) BP: (99-144)/(39-70) 114/62 mmHg (01/31 0808) SpO2:  [94 %-99 %] 96 % (01/31 0808) Weight:  [99.791 kg (220 lb)] 99.791 kg (220 lb) (01/30 1354) Weight change:  Last BM Date: 03/07/12 Intake/Output from previous day: +1640 01/30 0701 - 01/31 0700 In: 1640 [P.O.:1280; I.V.:260; IV Piggyback:100] Out: -  Intake/Output this shift:    PE: General:alert and oriented  Pacer site with minimal edema mild oozing under dressing Heart:S1S2 RRR Lungs:clear without rales rhonchi or wheezes Abd:+ BS Ext:no edema                                                                    ATRIAL                              VENTRICULAR  MEAS. IMPEDENCE                              466 ohms                                1.048 ohms  Measured thres.                                   0.625 V @ 0.40 ms                    0.375 V @ 0.40 ms      on  03/08/12  Programmed output                                 1.875 V/0.40 ms                      3.500 V / 0.40 ms  Measured P/R wave                                  >=80%                                      16.0 to >22.4 mV Programmed sensitivity                            0.50 mV  5.60 mV     Lab Results  Component Value Date   CHOL 134 12/20/2010   HDL 38* 12/20/2010   LDLCALC 75 12/20/2010   TRIG 104 12/20/2010   CHOLHDL 3.5 12/20/2010   Lab Results  Component Value Date   HGBA1C 6.2* 12/20/2010     Lab Results  Component Value Date   TSH 0.856 12/20/2010    Studies/Results: Dg Chest 2 View  03/08/2012  *RADIOLOGY REPORT*   Clinical Data: Pacemaker revision.  CHEST - 2 VIEW  Comparison: 03/01/2012  Findings: A third lead has been inserted. Generator has been replaced.  Heart size and pulmonary vascularity are at the upper limits of normal.  Lungs are clear.  No pneumothorax.  No effusions.  No acute osseous abnormality.  IMPRESSION: Satisfactory appearance of the chest after pacemaker revision.   Original Report Authenticated By: Lorriane Shire, M.D.    03/07/12 Procedure: Generator Medtronic Adapta L, model ADDRL1, serial number [id number]  Right atrial lead medtronic 5076-52 serial number [id number] (CHRONIC)  Right ventricular lead Medtronic [id number] serial number PJN [id number] (NEW)  Chronic RV lead medtronic [id number], serial number PJN [id number] (CAPPED)  Old generator Medtronic EnRhythm [id number], serial number PJN [id number] H(EXPLANTED)  Medications: I have reviewed the patient's current medications.    [name] Kitchen amLODipine  5 mg Oral BID  . aspirin EC  81 mg Oral Daily  . carvedilol  37.5 mg Oral Daily   And  . carvedilol  25 mg Oral QHS  .  ceFAZolin (ANCEF) IV  1 g Intravenous Q6H  . lisinopril  20 mg Oral Daily   And  . hydrochlorothiazide  25 mg Oral Daily  . insulin glargine  30 Units Subcutaneous QHS  . warfarin  5 mg Oral Daily  . 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  Resume warfarin today if no  bleeding. No Metformin for 48 hours from dye Glucose mildly elevated.   EKG a pacing v sensing deep t waves in V1, though not much change from 12/2011   LOS: 1 day   INGOLD,LAURA R 03/08/2012, 8:59 AM   I have seen and examined the patient along with [facility] R, NP.  I have reviewed the chart, notes and new data.  I agree with NP's note.  Key new complaints: mild soreness at site Key examination changes: slight oozing Key new findings / data: INR approaching 1.9; CXR looks good. 100% A paced, V sensed A lead impedance 1048 ohm, P wave undetected, threshold 0.5@0 .4 V lead impedance 1048 ohm, R wave 22.4, threshold 0.5@0 .4  PLAN: Hold warfarin for 1 day DC home Wound check 7-10 days. Activity restrictions and wound care instructions reviewed.  Sanda Klein, MD, [place] [phone] 03/08/2012, 9:52 AM

## 2012-03-08 NOTE — Progress Notes (Signed)
Pt's cbg 229 with lantus 30units scheduled.  Pt stated he normally gives himself 40units lantus with that number and metformin and glipizide.  Metformin is on hold.  Pt requested doctor to be called to possibly add more lantus or ssi.  While waiting for Kerin Ransom to call back-rechecked cbg and it was 207.  Pt then stated he only wanted to take the 30 units lantus tonight.  Will continue to monitor as needed.

## 2012-04-22 ENCOUNTER — Ambulatory Visit: Payer: Self-pay | Admitting: Cardiovascular Disease

## 2012-04-22 DIAGNOSIS — I4891 Unspecified atrial fibrillation: Secondary | ICD-10-CM

## 2012-04-22 DIAGNOSIS — Z7901 Long term (current) use of anticoagulants: Secondary | ICD-10-CM

## 2012-05-02 ENCOUNTER — Ambulatory Visit: Payer: Self-pay | Admitting: Unknown Physician Specialty

## 2012-05-02 LAB — CBC WITH DIFFERENTIAL/PLATELET
Eosinophil #: 0.1 10*3/uL (ref 0.0–0.7)
Eosinophil %: 1.7 %
HGB: 12.8 g/dL — ABNORMAL LOW (ref 13.0–18.0)
Lymphocyte #: 1.2 10*3/uL (ref 1.0–3.6)
Lymphocyte %: 19.2 %
MCH: 31.6 pg (ref 26.0–34.0)
MCHC: 34.3 g/dL (ref 32.0–36.0)
MCV: 92 fL (ref 80–100)
Platelet: 197 10*3/uL (ref 150–440)
RBC: 4.05 10*6/uL — ABNORMAL LOW (ref 4.40–5.90)

## 2012-05-02 LAB — BASIC METABOLIC PANEL
Anion Gap: 6 — ABNORMAL LOW (ref 7–16)
BUN: 38 mg/dL — ABNORMAL HIGH (ref 7–18)
Calcium, Total: 9.1 mg/dL (ref 8.5–10.1)
Chloride: 100 mmol/L (ref 98–107)
Creatinine: 1.53 mg/dL — ABNORMAL HIGH (ref 0.60–1.30)
Glucose: 100 mg/dL — ABNORMAL HIGH (ref 65–99)
Osmolality: 281 (ref 275–301)
Potassium: 4 mmol/L (ref 3.5–5.1)
Sodium: 136 mmol/L (ref 136–145)

## 2012-05-07 ENCOUNTER — Ambulatory Visit: Payer: Self-pay | Admitting: Unknown Physician Specialty

## 2012-05-07 LAB — PROTIME-INR: Prothrombin Time: 13.8 secs (ref 11.5–14.7)

## 2012-05-08 LAB — PATHOLOGY REPORT

## 2012-07-03 ENCOUNTER — Other Ambulatory Visit: Payer: Self-pay | Admitting: Neurological Surgery

## 2012-07-03 DIAGNOSIS — M545 Low back pain, unspecified: Secondary | ICD-10-CM

## 2012-07-04 ENCOUNTER — Other Ambulatory Visit: Payer: Self-pay | Admitting: Neurological Surgery

## 2012-07-04 DIAGNOSIS — M545 Low back pain, unspecified: Secondary | ICD-10-CM

## 2012-07-05 ENCOUNTER — Telehealth: Payer: Self-pay | Admitting: Pharmacist Clinician (PhC)/ Clinical Pharmacy Specialist

## 2012-07-08 ENCOUNTER — Ambulatory Visit
Admission: RE | Admit: 2012-07-08 | Discharge: 2012-07-08 | Disposition: A | Discharge status: Home / Self Care | Payer: Medicare Other | Source: Ambulatory Visit | Attending: Neurological Surgery | Admitting: Neurological Surgery

## 2012-07-08 ENCOUNTER — Ambulatory Visit (INDEPENDENT_AMBULATORY_CARE_PROVIDER_SITE_OTHER): Payer: Medicare Other | Admitting: Pharmacist Clinician (PhC)/ Clinical Pharmacy Specialist

## 2012-07-08 VITALS — BP 136/62 | HR 80

## 2012-07-08 VITALS — BP 117/52 | HR 63

## 2012-07-08 DIAGNOSIS — M545 Low back pain, unspecified: Secondary | ICD-10-CM

## 2012-07-08 DIAGNOSIS — Z7901 Long term (current) use of anticoagulants: Secondary | ICD-10-CM

## 2012-07-08 DIAGNOSIS — I4891 Unspecified atrial fibrillation: Secondary | ICD-10-CM

## 2012-07-08 LAB — POCT INR: INR: 1.3

## 2012-07-08 MED ORDER — ONDANSETRON HCL 4 MG/2ML IJ SOLN
4.0000 mg | Freq: Once | INTRAMUSCULAR | Status: AC
  Administered 2012-07-08: 4 mg via INTRAMUSCULAR

## 2012-07-08 MED ORDER — MEPERIDINE HCL 100 MG/ML IJ SOLN
75.0000 mg | Freq: Once | INTRAMUSCULAR | Status: AC
  Administered 2012-07-08: 75 mg via INTRAMUSCULAR

## 2012-07-08 MED ORDER — DIAZEPAM 5 MG PO TABS
5.0000 mg | ORAL_TABLET | Freq: Once | ORAL | Status: AC
  Administered 2012-07-08: 5 mg via ORAL

## 2012-07-08 MED ORDER — IOHEXOL 180 MG/ML  SOLN
15.0000 mL | Freq: Once | INTRAMUSCULAR | Status: AC | PRN
Start: 2012-07-08 — End: 2012-07-08
  Administered 2012-07-08: 15 mL via INTRATHECAL

## 2012-07-09 ENCOUNTER — Other Ambulatory Visit: Payer: Self-pay | Admitting: Neurological Surgery

## 2012-07-11 ENCOUNTER — Ambulatory Visit (INDEPENDENT_AMBULATORY_CARE_PROVIDER_SITE_OTHER): Payer: Medicare Other | Admitting: Cardiovascular Disease

## 2012-07-11 ENCOUNTER — Encounter: Payer: Self-pay | Admitting: Cardiovascular Disease

## 2012-07-11 ENCOUNTER — Ambulatory Visit: Payer: Medicare Other | Admitting: Pharmacist Clinician (PhC)/ Clinical Pharmacy Specialist

## 2012-07-11 VITALS — BP 114/50 | HR 62 | Ht 70.0 in | Wt 220.4 lb

## 2012-07-11 DIAGNOSIS — I2510 Atherosclerotic heart disease of native coronary artery without angina pectoris: Secondary | ICD-10-CM

## 2012-07-11 DIAGNOSIS — I2589 Other forms of chronic ischemic heart disease: Secondary | ICD-10-CM

## 2012-07-11 DIAGNOSIS — I4891 Unspecified atrial fibrillation: Secondary | ICD-10-CM

## 2012-07-11 DIAGNOSIS — I4729 Other ventricular tachycardia: Secondary | ICD-10-CM

## 2012-07-11 DIAGNOSIS — E119 Type 2 diabetes mellitus without complications: Secondary | ICD-10-CM

## 2012-07-11 DIAGNOSIS — I472 Ventricular tachycardia: Secondary | ICD-10-CM

## 2012-07-11 DIAGNOSIS — I259 Chronic ischemic heart disease, unspecified: Secondary | ICD-10-CM | POA: Insufficient documentation

## 2012-07-11 DIAGNOSIS — G4733 Obstructive sleep apnea (adult) (pediatric): Secondary | ICD-10-CM

## 2012-07-11 DIAGNOSIS — E785 Hyperlipidemia, unspecified: Secondary | ICD-10-CM

## 2012-07-11 DIAGNOSIS — I10 Essential (primary) hypertension: Secondary | ICD-10-CM

## 2012-07-11 DIAGNOSIS — I255 Ischemic cardiomyopathy: Secondary | ICD-10-CM

## 2012-07-11 DIAGNOSIS — I442 Atrioventricular block, complete: Secondary | ICD-10-CM

## 2012-07-11 DIAGNOSIS — B91 Sequelae of poliomyelitis: Secondary | ICD-10-CM

## 2012-07-11 DIAGNOSIS — Z01810 Encounter for preprocedural cardiovascular examination: Secondary | ICD-10-CM

## 2012-07-11 DIAGNOSIS — Z950 Presence of cardiac pacemaker: Secondary | ICD-10-CM

## 2012-07-11 DIAGNOSIS — Z8679 Personal history of other diseases of the circulatory system: Secondary | ICD-10-CM

## 2012-07-11 MED ORDER — CARVEDILOL 25 MG PO TABS: 25.0000 mg | ORAL_TABLET | Freq: Two times a day (BID) | ORAL | Status: DC

## 2012-07-11 NOTE — Assessment & Plan Note (Addendum)
His pacemaker has not reported any episodes of atrial fibrillation and at least a year. He is on a proper warfarin anticoagulation. I believe this can be interrupted for his planned back surgery and then resumed never his surgeon believes it is safe. I think this would be a safer approach "bridging" with a short acting injectable agents. I believe the risk of stroke during warfarin instructions to small than the risk of serious bleeding complication with "bridging" therapy.  Last meaningful episode of arrhythmia was in 2012 when he presented with atrial flutter with controlled ventricular response, successfully treated with overdrive pacing via his permanent pacemaker.

## 2012-07-11 NOTE — Assessment & Plan Note (Signed)
Occasionally detected by his pacemaker, never symptomatic, lead to cardiac catheterization in November 2012 with findings described above, not requiring revascularization.

## 2012-07-11 NOTE — Assessment & Plan Note (Signed)
He claims 100% compliance with CPAP therapy. He has had previous uvulopharyngoplasty surgery.

## 2012-07-11 NOTE — Assessment & Plan Note (Signed)
The patient has numerous cardiovascular problems and is pacemaker dependent he to atrial standstill. However none of his problems have had recent acute worsening. He has not had atrial tachyarrhythmias in over a year. He has had a fairly recent cardiac catheterization did not show any high-risk findings.  The overall risk of preoperative cardiac complications is low. No further workup is recommended before surgery. His warfarin should be interrupted 5-7 days before spinal surgery and resumed as soon as the surgeon believes it is safe to do so. I do not recommend "bridging therapy" with enoxaparin or heparin. It is important that his beta blocker (carvedilol not be interrupted in the perioperative period. It is unlikely that lumbar spine surgery will in any way interfere with pacemaker function.

## 2012-07-11 NOTE — Patient Instructions (Signed)
Your physician recommends that you schedule a follow-up appointment in: 6 months Remote pacemaker check via [facility] in 2 months (first weekend in August 2014).

## 2012-07-11 NOTE — Assessment & Plan Note (Signed)
Type 2 diabetes requiring oral antidiabetic and insulin therapy.

## 2012-07-11 NOTE — Assessment & Plan Note (Signed)
Adequate control. 

## 2012-07-11 NOTE — Assessment & Plan Note (Addendum)
Coronary bypass surgery 1990, redo bypass surgery 2004, currently asymptomatic  Cardiac catheterization November 2012 shows occluded LAD artery with patent sequential LIMA bypass to the first diagonal and LAD artery, 50% stenosis in major oblique marginal (OM 2), totally occluded proximal right coronary artery with patent saphenous vein graft to the posterior descending artery, but with subtotal occlusion of the posterior descending artery downstream of anastomosis  Normal nuclear perfusion study performed in 2003.  Mildly depressed left ventricular systolic function on appropriate treatment with ACE inhibitors and beta blockers, no clinically evident congestive heart failure  Left ventricular ejection fraction of 45-50% by echocardiography 2010, 50% by cardiac catheterization 2012

## 2012-07-11 NOTE — Assessment & Plan Note (Signed)
Despite numerous agents and multiple attempts he has been unable to tolerate any statin including most recently atorvastatin and rosuvastatin

## 2012-07-11 NOTE — Progress Notes (Signed)
Patient ID: Alan [name], male   DOB: 22-Jun-1940, 72 y.o.   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 He denies any angina or shortness of breath with activity. 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 Has not had any trouble with chest pain or shortness of breath. His major limiting factors are related to lumbar spine disease and he's planning to undergo L1-L3 vertebral fusion by Dr. Sherley Bounds on June 27.   No Known Allergies  Current Outpatient Prescriptions  Medication Sig Dispense Refill  . amLODipine (NORVASC) 10 MG tablet Take 5 mg by mouth 2 (two) times daily.      [name] Kitchen aspirin EC 81 MG tablet Take 81 mg by mouth daily.      . carvedilol (COREG) 25 MG tablet Take 1-1.5 tablets (25-37.5 mg total) by mouth 2 (two) times daily with a meal. Takes 37.5mg  every morning and 25mg  every evening.  360 tablet  3  . Chromium-Cinnamon (CINNAMON PLUS CHROMIUM PO) Take 1 tablet by mouth daily.      . Fish Oil-Cholecalciferol (FISH OIL-VITAMIN D) 1200-1000 MG-UNIT CAPS Take 1 tablet by mouth daily.      . Flaxseed, Linseed, 1300 MG CAPS Take 1,300 mg by mouth daily.      . furosemide (LASIX) 40 MG tablet Take 40 mg by mouth daily as needed. For swelling      . glipiZIDE (GLUCOTROL) 10 MG tablet Take 10 mg by mouth daily as needed. For high blood sugars      . insulin glargine (LANTUS) 100 UNIT/ML injection Inject 30 Units into the skin at bedtime as needed. For elevated blood sugars      . lisinopril-hydrochlorothiazide (PRINZIDE,ZESTORETIC) 20-25 MG per tablet Take 1 tablet by mouth daily.      . metFORMIN (GLUCOPHAGE) 500 MG tablet Take 500 mg by mouth 4 (four) times daily.      .  Multiple Vitamin (MULTIVITAMIN WITH MINERALS) TABS Take 1 tablet by mouth daily.      [name] Kitchen warfarin (COUMADIN) 5 MG tablet Take 5 mg by mouth daily.      . nitroGLYCERIN (NITROSTAT) 0.4 MG SL tablet Place 0.4 mg under the tongue every 5 (five) minutes as needed. For chest pain       No current facility-administered medications for this visit.    Past Medical History  Diagnosis Date  . Diabetes mellitus   . A-fib   . Complete heart block   . Coronary artery disease     seen at [facility] cardiology   . Hypertension     Dr. Veronia Beets, primary  . Sleep apnea     cpap, 11, last sleep study Jan2013, sees Dr. Keturah Barre  . Neuromuscular disorder     carpal tunnel bilaterally  . Polio     hx of  . Chronic back pain   . Pacemaker 03/07/2012    replaced  . Status post placement of cardiac pacemaker, 03/07/12 New Medtronic Gen and new Vent lead original placed 2008 03/08/2012  . Anticoagulant long-term use, on coumadin for PAF 03/08/2012    Past Surgical History  Procedure Laterality Date  . Coronary artery bypass graft  1990  . Stents    . Coronary artery bypass graft  2004  . Maze procedure  2004  . Pacemaker insertion    . Back surgery    . Cardiac catheterization    . Tonsillectomy    . Appendectomy    . Hernia repair    . Lumbar laminectomy/decompression microdiscectomy  01/10/2012    Procedure: LUMBAR LAMINECTOMY/DECOMPRESSION MICRODISCECTOMY 1 LEVEL;  Surgeon: [name], MD;  Location: [facility];  Service: Neurosurgery;  Laterality: Bilateral;  Lumbar three-four decompression, Posterior lateral fusion lumbar three-four, posterior spinus plate lumbar three-four  . Insert / replace / remove pacemaker  03/07/2012    replaced    Family History  Problem Relation Age of Onset  . Other Father     MVA  . Cancer Mother     History   Social History  . Marital Status: Single    Spouse Name: N/A    Number of Children: N/A  . Years of Education: N/A   Occupational  History  . pilot-retired    Social History Main Topics  . Smoking status: Never Smoker   . Smokeless tobacco: Never Used  . Alcohol Use: No  . Drug Use: No  . Sexually Active: Yes   Other Topics Concern  . Not on file   Social History Narrative   [place] [address] [place] [facility].     Review of systems: The patient specifically denies any chest pain at rest or with exertion, dyspnea at rest or with exertion, orthopnea, paroxysmal nocturnal dyspnea, syncope, palpitations, focal neurological deficits, intermittent claudication, lower extremity edema, unexplained weight gain, cough, hemoptysis or wheezing.  The patient also denies abdominal pain, nausea, vomiting, dysphagia, diarrhea, constipation, polyuria, polydipsia, dysuria, hematuria, frequency, urgency, abnormal bleeding or bruising, fever, chills, unexpected weight changes, mood swings, change in skin or hair texture, change in voice quality, auditory or visual problems, allergic reactions or rashes, new musculoskeletal complaints other than usual "aches and pains". 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   PHYSICAL EXAM BP 114/50  Pulse 62  Ht 5\' 10"  (1.778 m)  Wt 220 lb 6.4 oz (99.973 kg)  BMI 31.62 kg/m2  General: Alert, oriented x3, no distress, highly obese Head: no evidence of trauma, PERRL, EOMI, no exophtalmos or lid lag, no myxedema, no xanthelasma; normal ears, nose and oropharynx Neck: normal jugular venous pulsations and no hepatojugular reflux; brisk carotid pulses without delay and no carotid bruits Chest: clear to auscultation, no signs of consolidation by percussion or palpation, normal fremitus, symmetrical and full respiratory excursions, healthy sternotomy scar and left subclavian pacemaker site Cardiovascular: normal position and quality of the apical impulse, regular rhythm, normal first and second heart sounds, no murmurs, rubs or gallops Abdomen:  no tenderness or distention, no masses by palpation, no abnormal pulsatility or arterial bruits, normal bowel sounds, no hepatosplenomegaly Extremities: no clubbing, cyanosis or edema; 2+ radial, ulnar and brachial pulses bilaterally; 2+ right femoral, posterior tibial and dorsalis pedis pulses; 2+ left femoral, posterior tibial and dorsalis pedis pulses; no subclavian or femoral bruits Neurological: grossly nonfocal   EKG:  Atrial paced ventricular sensed rhythm, 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  Lipid Panel     Component Value Date/Time   CHOL 134 12/20/2010 0903   TRIG 104 12/20/2010 0903   HDL 38* 12/20/2010 0903   CHOLHDL 3.5 12/20/2010 0903   VLDL 21 12/20/2010 0903   LDLCALC 75 12/20/2010 0903  BMET    Component Value Date/Time   NA 136 01/03/2012 1252   K 4.2 01/03/2012 1252   CL 100 01/03/2012 1252   CO2 26 01/03/2012 1252   GLUCOSE 164* 01/03/2012 1252   BUN 28* 01/03/2012 1252   CREATININE 1.17 01/03/2012 1252   CALCIUM 9.8 01/03/2012 1252   GFRNONAA 62* 01/03/2012 1252   GFRAA 72* 01/03/2012 1252     ASSESSMENT AND PLAN ATRIAL FIBRILLATION, PAROXYSMAL His pacemaker has not reported any episodes of atrial fibrillation and at least a year. He is on a proper warfarin anticoagulation. I believe this can be interrupted for his planned back surgery and then resumed never his surgeon believes it is safe. I think this would be a safer approach "bridging" with a short acting injectable agents. I believe the risk of stroke during warfarin instructions to small than the risk of serious bleeding complication with "bridging" therapy.  Last meaningful episode of arrhythmia was in 2012 when he presented with atrial flutter with controlled ventricular response, successfully treated with overdrive pacing via his permanent pacemaker.  CAD Coronary bypass surgery 1990, redo bypass surgery 2004, currently  asymptomatic  Cardiac catheterization November 2012 shows occluded LAD artery with patent sequential LIMA bypass to the first diagonal and LAD artery, 50% stenosis in major oblique marginal (OM 2), totally occluded proximal right coronary artery with patent saphenous vein graft to the posterior descending artery, but with subtotal occlusion of the posterior descending artery downstream of anastomosis  Normal nuclear perfusion study performed in 2003  Left ventricular ejection fraction of 45-50% by echocardiography 2010, 50% by cardiac catheterization 2012  OBSTRUCTIVE SLEEP APNEA He claims 100% compliance with CPAP therapy. He has had previous uvulopharyngoplasty surgery.  Hypertension Adequate control  Non-sustained ventricular tachycardia Occasionally detected by his pacemaker, never symptomatic, lead to cardiac catheterization in November 2012 with findings described above, not requiring revascularization.  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  DIABETES MELLITUS Type 2 diabetes requiring oral antidiabetic and insulin therapy.  Dyslipidemia Despite numerous agents and multiple attempts he has been unable to tolerate any statin including most recently atorvastatin and rosuvastatin  Preop cardiovascular exam The patient has numerous cardiovascular problems and is pacemaker dependent he to atrial standstill. However none of his problems have had recent acute worsening. He has not had atrial tachyarrhythmias in over a year. He has had a fairly recent cardiac catheterization did not show any high-risk findings.  The overall risk of preoperative cardiac complications is low. No further workup is recommended before surgery. His warfarin should be interrupted 5-7 days before spinal surgery and resumed as soon as the surgeon believes it is safe to do so. I do not recommend "bridging therapy" with enoxaparin  or heparin. It is important that his beta blocker (carvedilol not be interrupted in the perioperative period. It is unlikely that lumbar spine surgery will in any way interfere with pacemaker function.   Orders Placed This Encounter  Procedures  . EKG 12-Lead   Meds ordered this encounter  Medications  . carvedilol (COREG) 25 MG tablet    Sig: Take 1-1.5 tablets (25-37.5 mg total) by mouth 2 (two) times daily with a meal. Takes 37.5mg  every morning and 25mg  every evening.    Dispense:  360 tablet    Refill:  [place] Allien Melberg, MD, [place] [phone] office [phone] pager

## 2012-07-11 NOTE — Assessment & Plan Note (Signed)
His right lower extremity is hypo-trophic and always slightly edematous; he has a surgically fused ankle.

## 2012-07-12 ENCOUNTER — Other Ambulatory Visit: Payer: Self-pay

## 2012-07-12 MED ORDER — CARVEDILOL 25 MG PO TABS: ORAL_TABLET | ORAL | Status: DC

## 2012-07-12 NOTE — Telephone Encounter (Signed)
Rx was sent to pharmacy electronically. 

## 2012-07-17 ENCOUNTER — Telehealth: Payer: Self-pay | Admitting: Cardiovascular Disease

## 2012-07-17 NOTE — Progress Notes (Signed)
Implanted device sheet faxed to Dr. Victorino December office. Cardiac records also requested.

## 2012-07-17 NOTE — Telephone Encounter (Signed)
Clarified carvedilol dose 37.5mg  qam and 25mg  qpm

## 2012-07-17 NOTE — Telephone Encounter (Signed)
Received E Script on 6-5-need the clarification of the directions please! Pt needs his medicine-its been since the 5th!

## 2012-07-18 ENCOUNTER — Ambulatory Visit: Payer: Medicare Other | Admitting: Cardiovascular Disease

## 2012-07-18 ENCOUNTER — Encounter (HOSPITAL_COMMUNITY)
Admission: RE | Admit: 2012-07-18 | Discharge: 2012-07-18 | Disposition: A | Discharge status: Home / Self Care | Payer: Medicare Other | Source: Ambulatory Visit | Attending: Neurological Surgery | Admitting: Neurological Surgery

## 2012-07-18 ENCOUNTER — Encounter (HOSPITAL_COMMUNITY): Payer: Self-pay

## 2012-07-18 DIAGNOSIS — Z01812 Encounter for preprocedural laboratory examination: Secondary | ICD-10-CM | POA: Insufficient documentation

## 2012-07-18 DIAGNOSIS — Z0183 Encounter for blood typing: Secondary | ICD-10-CM | POA: Insufficient documentation

## 2012-07-18 DIAGNOSIS — Z01818 Encounter for other preprocedural examination: Secondary | ICD-10-CM | POA: Insufficient documentation

## 2012-07-18 LAB — BASIC METABOLIC PANEL WITH GFR
BUN: 51 mg/dL — ABNORMAL HIGH (ref 6–23)
CO2: 27 meq/L (ref 19–32)
Calcium: 10.2 mg/dL (ref 8.4–10.5)
Chloride: 97 meq/L (ref 96–112)
Creatinine, Ser: 1.76 mg/dL — ABNORMAL HIGH (ref 0.50–1.35)
GFR calc Af Amer: 43 mL/min — ABNORMAL LOW
GFR calc non Af Amer: 37 mL/min — ABNORMAL LOW
Glucose, Bld: 161 mg/dL — ABNORMAL HIGH (ref 70–99)
Potassium: 4.4 meq/L (ref 3.5–5.1)
Sodium: 134 meq/L — ABNORMAL LOW (ref 135–145)

## 2012-07-18 LAB — CBC WITH DIFFERENTIAL/PLATELET
Basophils Absolute: 0 K/uL (ref 0.0–0.1)
Basophils Relative: 1 % (ref 0–1)
Eosinophils Absolute: 0.2 K/uL (ref 0.0–0.7)
Eosinophils Relative: 3 % (ref 0–5)
HCT: 37.6 % — ABNORMAL LOW (ref 39.0–52.0)
Hemoglobin: 13 g/dL (ref 13.0–17.0)
Lymphocytes Relative: 23 % (ref 12–46)
Lymphs Abs: 1.3 K/uL (ref 0.7–4.0)
MCH: 30.9 pg (ref 26.0–34.0)
MCHC: 34.6 g/dL (ref 30.0–36.0)
MCV: 89.3 fL (ref 78.0–100.0)
Monocytes Absolute: 0.5 K/uL (ref 0.1–1.0)
Monocytes Relative: 8 % (ref 3–12)
Neutro Abs: 3.7 K/uL (ref 1.7–7.7)
Neutrophils Relative %: 66 % (ref 43–77)
Platelets: 153 K/uL (ref 150–400)
RBC: 4.21 MIL/uL — ABNORMAL LOW (ref 4.22–5.81)
RDW: 12.7 % (ref 11.5–15.5)
WBC: 5.7 K/uL (ref 4.0–10.5)

## 2012-07-18 LAB — PROTIME-INR: Prothrombin Time: 21.1 seconds — ABNORMAL HIGH (ref 11.6–15.2)

## 2012-07-18 LAB — TYPE AND SCREEN: ABO/RH(D): A POS

## 2012-07-18 LAB — ABO/RH: ABO/RH(D): A POS

## 2012-07-18 LAB — APTT: aPTT: 34 seconds (ref 24–37)

## 2012-07-18 NOTE — Progress Notes (Signed)
Pt denies SOB and chest pain. Pt sees Dr. Fuller Mandril ( cardiologist at  [facility])  Peri-op prescription for implanted cardiac device programming documentation is in chart along with other cardiac history records. [name], Physician Assistant (PA) made aware of pt cardiac history (pacer) and did not need to speak with pt. However, after reviewing a recent updated copy of pt EKG dated 07/11/12  ( furnished by pt), chart was placed for anesthesia to  review EKG, lab results ( BUN, Creatinine, and  PT INR)  by [name], PA. Pt has instructions from cardiologist on when to stop anti-coaggulation medications ( copy in chart). Pt also advised to bring CPAP mask on DOS.

## 2012-07-19 NOTE — Progress Notes (Addendum)
Anesthesia Chart Review:  Patient is a 72 year old male scheduled for L3-4 maximum access PLIF on 07/24/12 by Dr. Ronnald Ramp.  History includes non-smoker, CAD s/p CABG 1990 and redo 2004 with Maze procedure, complete heart block s/p Medtronic PPM 2000 with generator change out 03/07/12, OSA s/p UPPP with CPAP use (Dr. 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 He also reports sinus surgery (polypectomy?) earlier this year at [facility]. PCP is listed as Dr. [name] at [facility].  Cardiologist is Dr. Sallyanne Kuster, last visit 07/11/12.  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  His preoperative assessment included, "The patient has numerous cardiovascular problems and is pacemaker dependent he to atrial standstill. However none of his problems have had recent acute worsening. He has not had atrial tachyarrhythmias in over a year. He has had a fairly recent cardiac catheterization did not show any high-risk findings. The overall risk of preoperative cardiac complications is low. No further workup is recommended before surgery. His warfarin should be interrupted 5-7 days before spinal surgery and resumed as soon as the surgeon believes it is safe to do so. I do not recommend "bridging therapy" with enoxaparin or heparin. It is important that his beta blocker (carvedilol not be interrupted in the perioperative period. It is unlikely that lumbar spine surgery will in any way interfere with pacemaker function."   Cardiac cath on 12/21/10 showed:  1. Widely patent LIMA-D1-LAD with excellent distal LAD and diagonal flow.  2. Widely patent SVG-RPDA with retrograde filling to the RPL system and native RCA demonstrating diffuse in-stent stenosis of RCA stent.  3. Known subtotal occlusion of RPDA beyond SVG.  4. Native circumflex was large OM 2 small  moderate OM1 and remaining vessel moderate disease, most significant being ostial OM1 60% in Xarelto <2 mm vessel. This appeared angiographically similar to prior angiography in 2011.  5. Left Ventriculography: EF 50-55%, no obvious wall motion abnormalities.  Echo on 06/29/08 showed atrial paced rhythm, mildly reduced LV systolic function, EF [id number], Doppler flow patter suggestive of impaired LV relaxation, LA moderately dilated, RV systolic pressure was elevated at 33 mmHg, aortic valve appeared mildly sclerotic, no AS. Mild MR/TR.   CXR from 03/08/12 showed: A third lead has been inserted. Generator has been replaced. Heart size and pulmonary vascularity are at the upper limits of normal. Lungs are clear. No pneumothorax. No effusions. No acute osseous abnormality.   Preoperative labs noted.  BUN/Cr are newly elevated at 51/1.76 (up from 28/1.17 on 01/03/12).  Glucose 161.  H/H 13.0/37.6.  PLT 153K.  PT/INR 21.1/1.90.  PTT 34.  T&S done.  He will need a repeat PT/INR and BMET preoperatively to ensure labs are improving prior to OR.  I spoke with Mr. Trites to discuss his BUN/Cr results.  He is taking metformin, lisinopril-HCTZ, furosemide daily.  Over the last month or so, he has also been taking ibuprofen 200mg  2 tabs BID.  He plans to hold NSAIDS for now and will await any additional instructions from Dr. Melford Aase.  I did speak with Colletta Maryland at Dr. Fayrene Fearing office who will have Dr. Melford Aase review for additional recommendations.  Labs faxed.  I also updated Lorriane Shire at Dr. Ronnald Ramp' office.  [facility] Short Stay Center/Anesthesiology Phone [phone] 07/19/2012 11:40 AM  Addendum: 07/22/12 1515 I followed up with Mr. Stuchell this afternoon.  He  is scheduled to see Dr. Melford Aase tomorrow at 1:30PM.  He may get labs repeated then.  I've already asked his PCP office to fax any new labs to Short Stay.  As above, if labs not repeated there then he will need a BMET and PT/INR done on the day of  surgery.  Of note, I did receive additional labs from [facility] from 05/02/12 that showed a BUN/Cr of 38/1.52. If Dr. Melford Aase does not recommend postponing surgery and follow-up labs are stable/improving then I would anticipate that he could proceed. I've left a voicemail and fax with Lorriane Shire at Dr. Ronnald Ramp' office giving her an update as well.

## 2012-07-23 MED ORDER — CEFAZOLIN SODIUM-DEXTROSE 2-3 GM-% IV SOLR: 2.0000 g | INTRAVENOUS | Status: AC

## 2012-07-25 ENCOUNTER — Other Ambulatory Visit (HOSPITAL_COMMUNITY): Payer: Medicare Other

## 2012-08-13 ENCOUNTER — Other Ambulatory Visit: Payer: Self-pay

## 2012-08-13 MED ORDER — LISINOPRIL-HYDROCHLOROTHIAZIDE 20-25 MG PO TABS: 1.0000 | ORAL_TABLET | Freq: Every day | ORAL | Status: DC

## 2012-08-13 NOTE — Telephone Encounter (Signed)
Rx was sent to pharmacy electronically. 

## 2012-08-22 ENCOUNTER — Other Ambulatory Visit: Payer: Self-pay | Admitting: Neurological Surgery

## 2012-08-22 ENCOUNTER — Encounter (HOSPITAL_COMMUNITY): Payer: Self-pay

## 2012-08-22 ENCOUNTER — Encounter (HOSPITAL_COMMUNITY)
Admission: RE | Admit: 2012-08-22 | Discharge: 2012-08-22 | Disposition: A | Discharge status: Home / Self Care | Payer: Medicare Other | Source: Ambulatory Visit | Attending: Neurological Surgery | Admitting: Neurological Surgery

## 2012-08-22 DIAGNOSIS — Z01818 Encounter for other preprocedural examination: Secondary | ICD-10-CM | POA: Insufficient documentation

## 2012-08-22 DIAGNOSIS — Z01812 Encounter for preprocedural laboratory examination: Secondary | ICD-10-CM | POA: Insufficient documentation

## 2012-08-22 LAB — PROTIME-INR
INR: 1.58 — ABNORMAL HIGH (ref 0.00–1.49)
Prothrombin Time: 18.4 seconds — ABNORMAL HIGH (ref 11.6–15.2)

## 2012-08-22 LAB — CBC WITH DIFFERENTIAL/PLATELET
Basophils Absolute: 0 10*3/uL (ref 0.0–0.1)
Basophils Relative: 1 % (ref 0–1)
Eosinophils Absolute: 0.2 10*3/uL (ref 0.0–0.7)
Eosinophils Relative: 4 % (ref 0–5)
HCT: 37.3 % — ABNORMAL LOW (ref 39.0–52.0)
Lymphocytes Relative: 19 % (ref 12–46)
MCH: 30.7 pg (ref 26.0–34.0)
MCHC: 34 g/dL (ref 30.0–36.0)
MCV: 90.1 fL (ref 78.0–100.0)
Monocytes Absolute: 0.4 10*3/uL (ref 0.1–1.0)
Platelets: 158 10*3/uL (ref 150–400)
RDW: 13 % (ref 11.5–15.5)

## 2012-08-22 LAB — BASIC METABOLIC PANEL
Calcium: 10.2 mg/dL (ref 8.4–10.5)
GFR calc Af Amer: 61 mL/min — ABNORMAL LOW (ref >90)
GFR calc non Af Amer: 53 mL/min — ABNORMAL LOW (ref >90)
Potassium: 4 mEq/L (ref 3.5–5.1)
Sodium: 137 mEq/L (ref 135–145)

## 2012-08-22 LAB — SURGICAL PCR SCREEN: MRSA, PCR: NEGATIVE

## 2012-08-22 NOTE — Progress Notes (Signed)
Patient req type and screen done dos. Refused to wear bracelet

## 2012-08-22 NOTE — Progress Notes (Signed)
Ekg 6/14,cxr  1/14 clearance cardiac note in epic , faxed info on chart re: pacemaker, other from dr

## 2012-08-22 NOTE — Pre-Procedure Instructions (Addendum)
ATHEL ELBERS  08/22/2012   Your procedure is scheduled on:  Wednesday July 29, 2012.  Report to [facility] at main entrance admit  630 am  Call this number if you have problems the morning of surgery: [phone]   Remember:   Do not eat food or drink liquids after midnight.   Take these medicines the morning of surgery with A SIP OF WATER: Amlodipine (Norvasc), Carvedilol (Coreg)              STOP flaxseed, fish oil, now cinnimon-chromium             aspirin, coumadin per dr  Lazaro Arms NOT take any diabetic medications the morning of your surgery   Do not wear jewelry,  Do not wear lotions, powders, or colognes.  Men may shave face and neck.  Do not bring valuables to the hospital.  [facility] is not responsible for any belongings or valuables.  Contacts, dentures or bridgework may not be worn into surgery.  Leave suitcase in the car. After surgery it may be brought to your room.  For patients admitted to the hospital, checkout time is 11:00 AM the day of discharge.   Patients discharged the day of surgery will not be allowed to drive home.  Name and phone number of your driver: Family/Friend  Special Instructions: Shower using CHG 2 nights before surgery and the night before surgery.  If you shower the day of surgery use CHG.  Use special wash - you have one bottle of CHG for all showers.  You should use approximately 1/3 of the bottle for each shower.   Please read over the following fact sheets that you were given: Pain Booklet, Coughing and Deep Breathing, Blood Transfusion Information, MRSA Information and Surgical Site Infection Prevention

## 2012-08-23 ENCOUNTER — Other Ambulatory Visit: Payer: Self-pay | Admitting: *Deleted

## 2012-08-23 NOTE — Progress Notes (Signed)
Anesthesia Note:  See my original note from 07/19/12. PLIF was rescheduled for 08/28/12.  He had a bump in his Cr in June and had to see his PCP, but it is unclear if this is why his surgery was postponed.  He was previously cleared by his cardiologist Dr. Sallyanne Kuster.    Preoperative labs noted.  BUN/Cr 28/1.33, improved from 51/1.75 on 07/18/12.  H/H 12.7/37.3.  PT/INR 18.4/1.58.  PTT WNL.  Glucose 138.  He was previously told to hold his Coumadin 5-7 days prior to surgery. Plan to repeat PT/INR on the day of surgery.  He will also need a T&S.  George [facility] Short Stay Center/Anesthesiology Phone [phone] 08/23/2012 1:16 PM

## 2012-08-27 MED ORDER — CEFAZOLIN SODIUM-DEXTROSE 2-3 GM-% IV SOLR
2.0000 g | INTRAVENOUS | Status: AC
  Administered 2012-08-28: 2 g via INTRAVENOUS
  Filled 2012-08-27: qty 50

## 2012-08-28 ENCOUNTER — Encounter (HOSPITAL_COMMUNITY): Payer: Self-pay | Admitting: Vascular Surgery

## 2012-08-28 ENCOUNTER — Encounter (HOSPITAL_COMMUNITY): Payer: Self-pay | Admitting: Anesthesiology

## 2012-08-28 ENCOUNTER — Inpatient Hospital Stay (HOSPITAL_COMMUNITY)
Admission: RE | Admit: 2012-08-28 | Discharge: 2012-08-29 | DRG: 460 | Disposition: A | Discharge status: Home / Self Care | Payer: Medicare Other | Source: Ambulatory Visit | Attending: Neurological Surgery | Admitting: Neurological Surgery

## 2012-08-28 ENCOUNTER — Encounter (HOSPITAL_COMMUNITY)
Admission: RE | Disposition: A | Discharge status: Home / Self Care | Payer: Self-pay | Source: Ambulatory Visit | Attending: Neurological Surgery

## 2012-08-28 ENCOUNTER — Inpatient Hospital Stay (HOSPITAL_COMMUNITY): Payer: Medicare Other

## 2012-08-28 ENCOUNTER — Encounter (HOSPITAL_COMMUNITY): Payer: Self-pay | Admitting: Neurological Surgery

## 2012-08-28 DIAGNOSIS — Z01812 Encounter for preprocedural laboratory examination: Secondary | ICD-10-CM

## 2012-08-28 DIAGNOSIS — Z794 Long term (current) use of insulin: Secondary | ICD-10-CM

## 2012-08-28 DIAGNOSIS — G4730 Sleep apnea, unspecified: Secondary | ICD-10-CM | POA: Diagnosis present

## 2012-08-28 DIAGNOSIS — E119 Type 2 diabetes mellitus without complications: Secondary | ICD-10-CM | POA: Diagnosis present

## 2012-08-28 DIAGNOSIS — Z950 Presence of cardiac pacemaker: Secondary | ICD-10-CM

## 2012-08-28 DIAGNOSIS — M7130 Other bursal cyst, unspecified site: Secondary | ICD-10-CM | POA: Diagnosis present

## 2012-08-28 DIAGNOSIS — Z96659 Presence of unspecified artificial knee joint: Secondary | ICD-10-CM

## 2012-08-28 DIAGNOSIS — I10 Essential (primary) hypertension: Secondary | ICD-10-CM | POA: Diagnosis present

## 2012-08-28 DIAGNOSIS — Z7901 Long term (current) use of anticoagulants: Secondary | ICD-10-CM

## 2012-08-28 DIAGNOSIS — Z79899 Other long term (current) drug therapy: Secondary | ICD-10-CM

## 2012-08-28 DIAGNOSIS — Z8612 Personal history of poliomyelitis: Secondary | ICD-10-CM

## 2012-08-28 DIAGNOSIS — Z981 Arthrodesis status: Secondary | ICD-10-CM

## 2012-08-28 DIAGNOSIS — I2510 Atherosclerotic heart disease of native coronary artery without angina pectoris: Secondary | ICD-10-CM | POA: Diagnosis present

## 2012-08-28 DIAGNOSIS — M48061 Spinal stenosis, lumbar region without neurogenic claudication: Principal | ICD-10-CM | POA: Diagnosis present

## 2012-08-28 DIAGNOSIS — I4891 Unspecified atrial fibrillation: Secondary | ICD-10-CM | POA: Diagnosis present

## 2012-08-28 DIAGNOSIS — Z7982 Long term (current) use of aspirin: Secondary | ICD-10-CM

## 2012-08-28 DIAGNOSIS — Z951 Presence of aortocoronary bypass graft: Secondary | ICD-10-CM

## 2012-08-28 HISTORY — PX: POSTERIOR FUSION LUMBAR SPINE: SUR632

## 2012-08-28 LAB — GLUCOSE, CAPILLARY: Glucose-Capillary: 96 mg/dL (ref 70–99)

## 2012-08-28 LAB — TYPE AND SCREEN: Antibody Screen: NEGATIVE

## 2012-08-28 SURGERY — FOR MAXIMUM ACCESS (MAS) POSTERIOR LUMBAR INTERBODY FUSION (PLIF) 1 LEVEL
Anesthesia: General | Site: Back | Wound class: Clean

## 2012-08-28 MED ORDER — BACITRACIN 50000 UNITS IM SOLR
INTRAMUSCULAR | Status: AC
  Filled 2012-08-28: qty 1

## 2012-08-28 MED ORDER — ONDANSETRON HCL 4 MG/2ML IJ SOLN: 4.0000 mg | INTRAMUSCULAR | Status: DC | PRN

## 2012-08-28 MED ORDER — LACTATED RINGERS IV SOLN
INTRAVENOUS | Status: DC | PRN
  Administered 2012-08-28 (×3): via INTRAVENOUS

## 2012-08-28 MED ORDER — HEMOSTATIC AGENTS (NO CHARGE) OPTIME
TOPICAL | Status: DC | PRN
  Administered 2012-08-28: 1 via TOPICAL

## 2012-08-28 MED ORDER — MORPHINE SULFATE 2 MG/ML IJ SOLN: 1.0000 mg | INTRAMUSCULAR | Status: DC | PRN

## 2012-08-28 MED ORDER — LISINOPRIL 20 MG PO TABS
20.0000 mg | ORAL_TABLET | Freq: Every day | ORAL | Status: DC
  Administered 2012-08-29: 20 mg via ORAL
  Filled 2012-08-28 (×2): qty 1

## 2012-08-28 MED ORDER — MIDAZOLAM HCL 2 MG/2ML IJ SOLN: 0.5000 mg | Freq: Once | INTRAMUSCULAR | Status: DC | PRN

## 2012-08-28 MED ORDER — HYDRALAZINE HCL 25 MG PO TABS
25.0000 mg | ORAL_TABLET | Freq: Every day | ORAL | Status: DC
  Administered 2012-08-28: 25 mg via ORAL
  Filled 2012-08-28 (×3): qty 1

## 2012-08-28 MED ORDER — CARVEDILOL 25 MG PO TABS
37.5000 mg | ORAL_TABLET | Freq: Every day | ORAL | Status: DC
  Administered 2012-08-29: 37.5 mg via ORAL
  Filled 2012-08-28 (×2): qty 1

## 2012-08-28 MED ORDER — FENTANYL CITRATE 0.05 MG/ML IJ SOLN
INTRAMUSCULAR | Status: DC | PRN
  Administered 2012-08-28: 50 ug via INTRAVENOUS
  Administered 2012-08-28: 25 ug via INTRAVENOUS
  Administered 2012-08-28: 250 ug via INTRAVENOUS
  Administered 2012-08-28 (×3): 50 ug via INTRAVENOUS
  Administered 2012-08-28: 25 ug via INTRAVENOUS

## 2012-08-28 MED ORDER — HYDROMORPHONE HCL PF 1 MG/ML IJ SOLN
0.2500 mg | INTRAMUSCULAR | Status: DC | PRN
  Administered 2012-08-28 (×2): 0.5 mg via INTRAVENOUS

## 2012-08-28 MED ORDER — ONDANSETRON HCL 4 MG/2ML IJ SOLN
INTRAMUSCULAR | Status: DC | PRN
  Administered 2012-08-28: 4 mg via INTRAVENOUS

## 2012-08-28 MED ORDER — BUPIVACAINE HCL (PF) 0.25 % IJ SOLN
INTRAMUSCULAR | Status: DC | PRN
  Administered 2012-08-28: 2 mL

## 2012-08-28 MED ORDER — CARVEDILOL 25 MG PO TABS
25.0000 mg | ORAL_TABLET | Freq: Every day | ORAL | Status: DC
  Administered 2012-08-28: 25 mg via ORAL
  Filled 2012-08-28 (×2): qty 1

## 2012-08-28 MED ORDER — AMLODIPINE BESYLATE 5 MG PO TABS
5.0000 mg | ORAL_TABLET | Freq: Two times a day (BID) | ORAL | Status: DC
  Administered 2012-08-28 – 2012-08-29 (×2): 5 mg via ORAL
  Filled 2012-08-28 (×3): qty 1

## 2012-08-28 MED ORDER — LIDOCAINE HCL (CARDIAC) 20 MG/ML IV SOLN
INTRAVENOUS | Status: DC | PRN
  Administered 2012-08-28: 20 mg via INTRAVENOUS

## 2012-08-28 MED ORDER — LISINOPRIL-HYDROCHLOROTHIAZIDE 20-25 MG PO TABS: 1.0000 | ORAL_TABLET | Freq: Every day | ORAL | Status: DC

## 2012-08-28 MED ORDER — ACETAMINOPHEN 325 MG PO TABS: 650.0000 mg | ORAL_TABLET | ORAL | Status: DC | PRN

## 2012-08-28 MED ORDER — SODIUM CHLORIDE 0.9 % IR SOLN
Status: DC | PRN
  Administered 2012-08-28: 08:00:00

## 2012-08-28 MED ORDER — SODIUM CHLORIDE 0.9 % IJ SOLN: 3.0000 mL | INTRAMUSCULAR | Status: DC | PRN

## 2012-08-28 MED ORDER — INSULIN GLARGINE 100 UNIT/ML ~~LOC~~ SOLN
20.0000 [IU] | Freq: Every day | SUBCUTANEOUS | Status: DC | PRN
  Filled 2012-08-28: qty 0.2

## 2012-08-28 MED ORDER — ASPIRIN EC 81 MG PO TBEC
81.0000 mg | DELAYED_RELEASE_TABLET | Freq: Every day | ORAL | Status: DC
  Administered 2012-08-28 – 2012-08-29 (×2): 81 mg via ORAL
  Filled 2012-08-28 (×2): qty 1

## 2012-08-28 MED ORDER — SODIUM CHLORIDE 0.9 % IV SOLN
INTRAVENOUS | Status: AC
  Filled 2012-08-28: qty 500

## 2012-08-28 MED ORDER — SUCCINYLCHOLINE CHLORIDE 20 MG/ML IJ SOLN
INTRAMUSCULAR | Status: DC | PRN
  Administered 2012-08-28: 140 mg via INTRAVENOUS

## 2012-08-28 MED ORDER — OXYCODONE HCL 5 MG/5ML PO SOLN: 5.0000 mg | Freq: Once | ORAL | Status: DC | PRN

## 2012-08-28 MED ORDER — CELECOXIB 200 MG PO CAPS
200.0000 mg | ORAL_CAPSULE | Freq: Two times a day (BID) | ORAL | Status: DC
  Administered 2012-08-28 – 2012-08-29 (×2): 200 mg via ORAL
  Filled 2012-08-28 (×4): qty 1

## 2012-08-28 MED ORDER — MEPERIDINE HCL 25 MG/ML IJ SOLN: 6.2500 mg | INTRAMUSCULAR | Status: DC | PRN

## 2012-08-28 MED ORDER — PROMETHAZINE HCL 25 MG/ML IJ SOLN: 6.2500 mg | INTRAMUSCULAR | Status: DC | PRN

## 2012-08-28 MED ORDER — OXYCODONE HCL 5 MG PO TABS
10.0000 mg | ORAL_TABLET | ORAL | Status: DC | PRN
  Administered 2012-08-28: 10 mg via ORAL
  Filled 2012-08-28: qty 2

## 2012-08-28 MED ORDER — NITROGLYCERIN 0.4 MG SL SUBL: 0.4000 mg | SUBLINGUAL_TABLET | SUBLINGUAL | Status: DC | PRN

## 2012-08-28 MED ORDER — CARVEDILOL 25 MG PO TABS: 25.0000 mg | ORAL_TABLET | Freq: Two times a day (BID) | ORAL | Status: DC

## 2012-08-28 MED ORDER — ACETAMINOPHEN 10 MG/ML IV SOLN
INTRAVENOUS | Status: AC
  Administered 2012-08-28: 1000 mg via INTRAVENOUS
  Filled 2012-08-28: qty 100

## 2012-08-28 MED ORDER — SODIUM CHLORIDE 0.9 % IJ SOLN
3.0000 mL | Freq: Two times a day (BID) | INTRAMUSCULAR | Status: DC
  Administered 2012-08-28 – 2012-08-29 (×2): 3 mL via INTRAVENOUS

## 2012-08-28 MED ORDER — METFORMIN HCL 500 MG PO TABS
500.0000 mg | ORAL_TABLET | Freq: Three times a day (TID) | ORAL | Status: DC
  Administered 2012-08-28 – 2012-08-29 (×3): 500 mg via ORAL
  Filled 2012-08-28 (×7): qty 1

## 2012-08-28 MED ORDER — ACETAMINOPHEN 10 MG/ML IV SOLN
1000.0000 mg | Freq: Four times a day (QID) | INTRAVENOUS | Status: DC
  Administered 2012-08-28 – 2012-08-29 (×3): 1000 mg via INTRAVENOUS
  Filled 2012-08-28 (×5): qty 100

## 2012-08-28 MED ORDER — GLIPIZIDE 10 MG PO TABS
10.0000 mg | ORAL_TABLET | Freq: Every day | ORAL | Status: DC | PRN
  Administered 2012-08-28: 10 mg via ORAL
  Filled 2012-08-28: qty 1

## 2012-08-28 MED ORDER — 0.9 % SODIUM CHLORIDE (POUR BTL) OPTIME
TOPICAL | Status: DC | PRN
  Administered 2012-08-28: 1000 mL

## 2012-08-28 MED ORDER — CEFAZOLIN SODIUM 1-5 GM-% IV SOLN
1.0000 g | Freq: Three times a day (TID) | INTRAVENOUS | Status: AC
  Administered 2012-08-28 – 2012-08-29 (×2): 1 g via INTRAVENOUS
  Filled 2012-08-28 (×2): qty 50

## 2012-08-28 MED ORDER — SENNA 8.6 MG PO TABS
1.0000 | ORAL_TABLET | Freq: Two times a day (BID) | ORAL | Status: DC
  Administered 2012-08-28 – 2012-08-29 (×2): 8.6 mg via ORAL
  Filled 2012-08-28 (×2): qty 1

## 2012-08-28 MED ORDER — MIDAZOLAM HCL 5 MG/5ML IJ SOLN
INTRAMUSCULAR | Status: DC | PRN
  Administered 2012-08-28: 2 mg via INTRAVENOUS

## 2012-08-28 MED ORDER — PHENOL 1.4 % MT LIQD: 1.0000 | OROMUCOSAL | Status: DC | PRN

## 2012-08-28 MED ORDER — MENTHOL 3 MG MT LOZG
1.0000 | LOZENGE | OROMUCOSAL | Status: DC | PRN
  Filled 2012-08-28: qty 9

## 2012-08-28 MED ORDER — FUROSEMIDE 40 MG PO TABS
40.0000 mg | ORAL_TABLET | Freq: Every day | ORAL | Status: DC
  Administered 2012-08-28 – 2012-08-29 (×2): 40 mg via ORAL
  Filled 2012-08-28 (×2): qty 1

## 2012-08-28 MED ORDER — DEXAMETHASONE SODIUM PHOSPHATE 10 MG/ML IJ SOLN
10.0000 mg | INTRAMUSCULAR | Status: AC
  Administered 2012-08-28: 10 mg via INTRAVENOUS

## 2012-08-28 MED ORDER — SODIUM CHLORIDE 0.9 % IV SOLN: 250.0000 mL | INTRAVENOUS | Status: DC

## 2012-08-28 MED ORDER — ACETAMINOPHEN 650 MG RE SUPP: 650.0000 mg | RECTAL | Status: DC | PRN

## 2012-08-28 MED ORDER — ARTIFICIAL TEARS OP OINT
TOPICAL_OINTMENT | OPHTHALMIC | Status: DC | PRN
  Administered 2012-08-28: 1 via OPHTHALMIC

## 2012-08-28 MED ORDER — POTASSIUM CHLORIDE IN NACL 20-0.9 MEQ/L-% IV SOLN
INTRAVENOUS | Status: DC
  Administered 2012-08-28: 1000 mL via INTRAVENOUS
  Filled 2012-08-28 (×5): qty 1000

## 2012-08-28 MED ORDER — HYDROMORPHONE HCL PF 1 MG/ML IJ SOLN
INTRAMUSCULAR | Status: AC
  Filled 2012-08-28: qty 1

## 2012-08-28 MED ORDER — OXYCODONE HCL 5 MG PO TABS: 5.0000 mg | ORAL_TABLET | Freq: Once | ORAL | Status: DC | PRN

## 2012-08-28 MED ORDER — HYDROCHLOROTHIAZIDE 25 MG PO TABS
25.0000 mg | ORAL_TABLET | Freq: Every day | ORAL | Status: DC
  Administered 2012-08-29: 25 mg via ORAL
  Filled 2012-08-28: qty 1

## 2012-08-28 MED ORDER — DEXAMETHASONE SODIUM PHOSPHATE 10 MG/ML IJ SOLN
INTRAMUSCULAR | Status: AC
  Filled 2012-08-28: qty 1

## 2012-08-28 MED ORDER — THROMBIN 20000 UNITS EX SOLR
CUTANEOUS | Status: DC | PRN
  Administered 2012-08-28: 08:00:00 via TOPICAL

## 2012-08-28 MED ORDER — ALBUMIN HUMAN 5 % IV SOLN
INTRAVENOUS | Status: DC | PRN
  Administered 2012-08-28: 11:00:00 via INTRAVENOUS

## 2012-08-28 MED ORDER — PROPOFOL 10 MG/ML IV BOLUS
INTRAVENOUS | Status: DC | PRN
  Administered 2012-08-28: 110 mg via INTRAVENOUS

## 2012-08-28 SURGICAL SUPPLY — 64 items
BAG DECANTER FOR FLEXI CONT (MISCELLANEOUS) ×2 IMPLANT
BENZOIN TINCTURE PRP APPL 2/3 (GAUZE/BANDAGES/DRESSINGS) ×2 IMPLANT
BLADE SURG ROTATE 9660 (MISCELLANEOUS) IMPLANT
BONE MATRIX OSTEOCEL PRO MED (Bone Implant) ×2 IMPLANT
BUR MATCHSTICK NEURO 3.0 LAGG (BURR) ×2 IMPLANT
CAGE COROENT 9X9X28M-4 (Cage) ×4 IMPLANT
CANISTER SUCTION 2500CC (MISCELLANEOUS) ×2 IMPLANT
CLIP NEUROVISION LG (CLIP) ×2 IMPLANT
CLOTH BEACON ORANGE TIMEOUT ST (SAFETY) ×2 IMPLANT
CONT SPEC 4OZ CLIKSEAL STRL BL (MISCELLANEOUS) ×4 IMPLANT
COVER BACK TABLE 24X17X13 BIG (DRAPES) IMPLANT
COVER TABLE BACK 60X90 (DRAPES) ×2 IMPLANT
DRAPE C-ARM 42X72 X-RAY (DRAPES) ×2 IMPLANT
DRAPE C-ARMOR (DRAPES) ×2 IMPLANT
DRAPE LAPAROTOMY 100X72X124 (DRAPES) ×2 IMPLANT
DRAPE POUCH INSTRU U-SHP 10X18 (DRAPES) ×2 IMPLANT
DRAPE SURG 17X23 STRL (DRAPES) ×2 IMPLANT
DRESSING TELFA 8X3 (GAUZE/BANDAGES/DRESSINGS) ×2 IMPLANT
DRSG OPSITE 4X5.5 SM (GAUZE/BANDAGES/DRESSINGS) ×2 IMPLANT
DRSG OPSITE POSTOP 4X10 (GAUZE/BANDAGES/DRESSINGS) ×2 IMPLANT
DRSG OPSITE POSTOP 4X8 (GAUZE/BANDAGES/DRESSINGS) ×2 IMPLANT
DURAPREP 26ML APPLICATOR (WOUND CARE) ×2 IMPLANT
ELECT REM PT RETURN 9FT ADLT (ELECTROSURGICAL) ×2
ELECTRODE REM PT RTRN 9FT ADLT (ELECTROSURGICAL) ×1 IMPLANT
EVACUATOR 1/8 PVC DRAIN (DRAIN) ×2 IMPLANT
GAUZE SPONGE 4X4 16PLY XRAY LF (GAUZE/BANDAGES/DRESSINGS) IMPLANT
GLOVE BIO SURGEON STRL SZ8 (GLOVE) ×6 IMPLANT
GLOVE BIOGEL M 8.0 STRL (GLOVE) IMPLANT
GLOVE EXAM NITRILE LRG STRL (GLOVE) ×2 IMPLANT
GLOVE INDICATOR 7.0 STRL GRN (GLOVE) ×2 IMPLANT
GLOVE INDICATOR 8.5 STRL (GLOVE) ×2 IMPLANT
GLOVE OPTIFIT SS 6.5 STRL BRWN (GLOVE) ×6 IMPLANT
GOWN BRE IMP SLV AUR LG STRL (GOWN DISPOSABLE) ×2 IMPLANT
GOWN BRE IMP SLV AUR XL STRL (GOWN DISPOSABLE) ×6 IMPLANT
GOWN STRL REIN 2XL LVL4 (GOWN DISPOSABLE) IMPLANT
HEMOSTAT POWDER KIT SURGIFOAM (HEMOSTASIS) IMPLANT
KIT BASIN OR (CUSTOM PROCEDURE TRAY) ×2 IMPLANT
KIT NEEDLE NVM5 EMG ELECT (KITS) ×1 IMPLANT
KIT NEEDLE NVM5 EMG ELECTRODE (KITS) ×1
KIT ROOM TURNOVER OR (KITS) ×2 IMPLANT
MILL MEDIUM DISP (BLADE) ×2 IMPLANT
NEEDLE HYPO 25X1 1.5 SAFETY (NEEDLE) ×2 IMPLANT
NS IRRIG 1000ML POUR BTL (IV SOLUTION) ×2 IMPLANT
PACK LAMINECTOMY NEURO (CUSTOM PROCEDURE TRAY) ×2 IMPLANT
PAD ARMBOARD 7.5X6 YLW CONV (MISCELLANEOUS) ×6 IMPLANT
ROD 5.5X40MM (Rod) ×4 IMPLANT
SCREW LOCK (Screw) ×4 IMPLANT
SCREW LOCK FXNS SPNE MAS PL (Screw) ×4 IMPLANT
SCREW PLIF MAS 5.0X35 (Screw) ×4 IMPLANT
SCREW SHANK 5.0X30MM (Screw) ×2 IMPLANT
SCREW SHANK 5.0X35 (Screw) ×2 IMPLANT
SCREW TULIP 5.5 (Screw) ×4 IMPLANT
SPONGE LAP 4X18 X RAY DECT (DISPOSABLE) IMPLANT
SPONGE SURGIFOAM ABS GEL 100 (HEMOSTASIS) ×2 IMPLANT
STRIP CLOSURE SKIN 1/2X4 (GAUZE/BANDAGES/DRESSINGS) ×2 IMPLANT
SUT VIC AB 0 CT1 18XCR BRD8 (SUTURE) ×1 IMPLANT
SUT VIC AB 0 CT1 8-18 (SUTURE) ×1
SUT VIC AB 2-0 CP2 18 (SUTURE) ×2 IMPLANT
SUT VIC AB 3-0 SH 8-18 (SUTURE) ×4 IMPLANT
SYR 20ML ECCENTRIC (SYRINGE) ×4 IMPLANT
TOWEL OR 17X24 6PK STRL BLUE (TOWEL DISPOSABLE) ×2 IMPLANT
TOWEL OR 17X26 10 PK STRL BLUE (TOWEL DISPOSABLE) ×2 IMPLANT
TRAY FOLEY CATH 14FRSI W/METER (CATHETERS) IMPLANT
WATER STERILE IRR 1000ML POUR (IV SOLUTION) ×2 IMPLANT

## 2012-08-28 NOTE — Anesthesia Preprocedure Evaluation (Addendum)
Anesthesia Evaluation  Patient identified by MRN, date of birth, ID band Patient awake    Reviewed: Allergy & Precautions, H&P , NPO status , Patient's Chart, lab work & pertinent test results, reviewed documented beta blocker date and time   History of Anesthesia Complications Negative for: history of anesthetic complications  Airway Mallampati: I TM Distance: >3 FB Neck ROM: Full    Dental  (+) Dental Advisory Given and Teeth Intact   Pulmonary sleep apnea and Continuous Positive Airway Pressure Ventilation ,  breath sounds clear to auscultation  Pulmonary exam normal       Cardiovascular hypertension, Pt. on medications and Pt. on home beta blockers + CAD and + CABG + dysrhythmias (INR 0.95, NSVT) Atrial Fibrillation and Ventricular Tachycardia + pacemaker Rhythm:Irregular Rate:Normal  '12 cath: EF 45-50%, PDA disease distal to graft   Neuro/Psych Chronic back pain    GI/Hepatic Neg liver ROS, GERD-  Medicated and Controlled,  Endo/Other  diabetes (glu 97), Well Controlled, Type 2, Insulin Dependent and Oral Hypoglycemic AgentsMorbid obesity  Renal/GU negative Renal ROS     Musculoskeletal   Abdominal (+) + obese,   Peds  Hematology   Anesthesia Other Findings   Reproductive/Obstetrics                          Anesthesia Physical Anesthesia Plan  ASA: III  Anesthesia Plan: General   Post-op Pain Management:    Induction: Intravenous  Airway Management Planned: Oral ETT  Additional Equipment:   Intra-op Plan:   Post-operative Plan: Extubation in OR  Informed Consent: I have reviewed the patients History and Physical, chart, labs and discussed the procedure including the risks, benefits and alternatives for the proposed anesthesia with the patient or authorized representative who has indicated his/her understanding and acceptance.   Dental advisory given  Plan Discussed with:  Anesthesiologist, Surgeon and CRNA  Anesthesia Plan Comments: (Plan routine monitors, GETA)       Anesthesia Quick Evaluation

## 2012-08-28 NOTE — H&P (Signed)
Subjective: Patient is a 72 y.o. male admitted for PLIF L3-4. Had DLL L3-4, L4-5 a few months ago, but had a return of pain. Onset of symptoms was 2-3 months ago, gradually worse since that time.  The pain is rated as severe , and is located at the low back and radiates to legs. The pain is described as aching and occurs when up. The symptoms have been progressive. Symptoms are exacerbated by standing and walking. MRI or CT showed synovial cysts with stenosis L3-4.   Past Medical History  Diagnosis Date  . Diabetes mellitus   . A-fib   . Complete heart block   . Coronary artery disease     seen at [facility] cardiology   . Hypertension     Dr. Veronia Beets, primary  . Sleep apnea     cpap, 11, last sleep study Jan2013, sees Dr. Keturah Barre  . Neuromuscular disorder     carpal tunnel bilaterally  . Polio     hx of  . Chronic back pain   . Pacemaker 03/07/2012    replaced  . Status post placement of cardiac pacemaker, 03/07/12 New Medtronic Gen and new Vent lead original placed 2008 03/08/2012  . Anticoagulant long-term use, on coumadin for PAF 03/08/2012    Past Surgical History  Procedure Laterality Date  . Coronary artery bypass graft  1990  . Stents    . Coronary artery bypass graft  2004  . Maze procedure  2004  . Pacemaker insertion  08, 1/14  . Back surgery    . Cardiac catheterization    . Tonsillectomy    . Appendectomy    . Hernia repair    . Lumbar laminectomy/decompression microdiscectomy  01/10/2012    Procedure: LUMBAR LAMINECTOMY/DECOMPRESSION MICRODISCECTOMY 1 LEVEL;  Surgeon: [name], MD;  Location: [facility];  Service: Neurosurgery;  Laterality: Bilateral;  Lumbar three-four decompression, Posterior lateral fusion lumbar three-four, posterior spinus plate lumbar three-four  . Insert / replace / remove pacemaker  03/07/2012    replaced  . Joint replacement      Hx: of left knee  . Fracture surgery      Hx: of right leg  . Nasal septum surgery N/A 14     Prior to Admission medications   Medication Sig Start Date End Date Taking? Authorizing Provider  amLODipine (NORVASC) 10 MG tablet Take 5 mg by mouth 2 (two) times daily.   Yes Historical Provider, MD  aspirin EC 81 MG tablet Take 81 mg by mouth daily.   Yes Historical Provider, MD  carvedilol (COREG) 25 MG tablet Take 25-37.5 mg by mouth 2 (two) times daily with a meal. 07/12/12  Yes Mihai [name], MD  Chromium-Cinnamon (CINNAMON PLUS CHROMIUM PO) Take 1 tablet by mouth at bedtime.    Yes Historical Provider, MD  fish oil-omega-3 fatty acids 1000 MG capsule Take 1 g by mouth every morning.   Yes Historical Provider, MD  Flaxseed, Linseed, 1300 MG CAPS Take 1,300 mg by mouth daily.   Yes Historical Provider, MD  furosemide (LASIX) 40 MG tablet Take 40 mg by mouth daily.    Yes Historical Provider, MD  glipiZIDE (GLUCOTROL) 10 MG tablet Take 10 mg by mouth daily as needed (for high blood surgars). For high blood sugars   Yes Historical Provider, MD  hydrALAZINE (APRESOLINE) 50 MG tablet Take 25 mg by mouth at bedtime.   Yes Historical Provider, MD  ibuprofen (ADVIL,MOTRIN) 200 MG tablet Take 200 mg by  mouth at bedtime as needed for pain.   Yes Historical Provider, MD  metFORMIN (GLUCOPHAGE) 500 MG tablet Take 500 mg by mouth 4 (four) times daily. 12/21/10  Yes Erlene Quan, PA-C  Multiple Vitamin (MULTIVITAMIN WITH MINERALS) TABS Take 1 tablet by mouth every morning.   Yes Historical Provider, MD  nitroGLYCERIN (NITROSTAT) 0.4 MG SL tablet Place 0.4 mg under the tongue every 5 (five) minutes as needed. For chest pain   Yes Historical Provider, MD  warfarin (COUMADIN) 5 MG tablet Take 5-7.5 mg by mouth daily. Tues & Thurs-1.5 tab (to total 7.5 mg); Allo other days  1 tab (to total 5 mg)   Yes Historical Provider, MD  insulin glargine (LANTUS) 100 UNIT/ML injection Inject 20 Units into the skin daily as needed (elevated blood sugars).     Historical Provider, MD  lisinopril-hydrochlorothiazide  (PRINZIDE,ZESTORETIC) 20-25 MG per tablet Take 1 tablet by mouth daily. 08/13/12   Sanda Klein, MD   No Known Allergies  History  Substance Use Topics  . Smoking status: Never Smoker   . Smokeless tobacco: Never Used  . Alcohol Use: No    Family History  Problem Relation Age of Onset  . Other Father     MVA  . Cancer Mother      Review of Systems  Positive ROS: neg  All other systems have been reviewed and were otherwise negative with the exception of those mentioned in the HPI and as above.  Objective: Vital signs in last 24 hours:    General Appearance: Alert, cooperative, no distress, appears stated age Head: Normocephalic, without obvious abnormality, atraumatic Eyes: PERRL, conjunctiva/corneas clear, EOM's intact      Neck: Supple, symmetrical, trachea midline Back: Symmetric, no curvature Lungs: respirations unlabored Heart: Irregular rate and rhythm Abdomen: Soft, non-tender Extremities: Extremities normal, atraumatic, no cyanosis or edema Pulses: 2+ and symmetric all extremities Skin: Skin color, texture, turgor normal, no rashes or lesions  NEUROLOGIC:   Mental status: Alert and oriented x4,  no aphasia, good attention span, fund of knowledge, and memory Motor Exam - grossly normal Sensory Exam - grossly normal Reflexes: trace Coordination - grossly normal Gait - grossly normal Balance - grossly normal Cranial Nerves: I: smell Not tested  II: visual acuity  OS: nl    OD: nl  II: visual fields Full to confrontation  II: pupils Equal, round, reactive to light  III,VII: ptosis None  III,IV,VI: extraocular muscles  Full ROM  V: mastication Normal  V: facial light touch sensation  Normal  V,VII: corneal reflex  Present  VII: facial muscle function - upper  Normal  VII: facial muscle function - lower Normal  VIII: hearing Not tested  IX: soft palate elevation  Normal  IX,X: gag reflex Present  XI: trapezius strength  5/5  XI: sternocleidomastoid  strength 5/5  XI: neck flexion strength  5/5  XII: tongue strength  Normal    Data Review Lab Results  Component Value Date   WBC 5.2 08/22/2012   HGB 12.7* 08/22/2012   HCT 37.3* 08/22/2012   MCV 90.1 08/22/2012   PLT 158 08/22/2012   Lab Results  Component Value Date   NA 137 08/22/2012   K 4.0 08/22/2012   CL 99 08/22/2012   CO2 27 08/22/2012   BUN 28* 08/22/2012   CREATININE 1.33 08/22/2012   GLUCOSE 138* 08/22/2012   Lab Results  Component Value Date   INR 1.58* 08/22/2012    Assessment/Plan: Patient admitted for Mas  PLIF L3-4. Patient has failed conservative therapy.  I explained the condition and procedure to the patient and answered any questions.  Patient wishes to proceed with procedure as planned. Understands risks/ benefits and typical outcomes of procedure.   Najmah Carradine S 08/28/2012 6:37 AM

## 2012-08-28 NOTE — Op Note (Signed)
08/28/2012  12:00 PM  PATIENT:  Alan [name]  72 y.o. 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 Decompressive lumbar laminectomy L3-4 and removal of bilateral synovial cysts requiring more work than would be required of the typical PLIF procedure in order to adequately decompress the neural elements.  2. Posterior lumbar interbody fusion L3-4 using PEEK interbody cages packed with morcellized allograft and autograft   3. Posterior fixation L3-4 using cortical pedicle screws.  4. Intertransverse arthrodesis L3-4 using morcellized autograft and allograft.  SURGEON:  Sherley Bounds, MD  ASSISTANTS: Dr. Saintclair Halsted  ANESTHESIA:  General  EBL: 400 ml  Total I/O In: 2750 [I.V.:2500; IV Piggyback:250] Out: 28 [Urine:105; Blood:450]  BLOOD ADMINISTERED:none  DRAINS: Hemovac   INDICATION FOR PROCEDURE: This patient underwent previous decompressive laminectomy at L3-4. He presented with recurrent pain. He had recurrent stenosis seen on a CT myelogram with large bilateral synovial cysts and instability at L3-4. I recommended a repeat decompression and instrumented fusion. Patient understood the risks, benefits, and alternatives and potential outcomes and wished to proceed.  PROCEDURE DETAILS:  The patient was brought to the operating room. After induction of generalized endotracheal anesthesia the patient was rolled into the prone position on chest rolls and all pressure points were padded. The patient's lumbar region was cleaned and then prepped with DuraPrep and draped in the usual sterile fashion. Anesthesia was injected and then a dorsal midline incision was made and carried down to the lumbosacral fascia. The fascia was opened and the paraspinous musculature was taken down in a subperiosteal fashion to expose L3-4 and the previous  interspinous plate. The interspinous plate was unlocked and then removed from L3-4 . Intraoperative fluoroscopy confirmed my level, and I started with placement of the L3 cortical pedicle screws. The pedicle screw entry zones were identified utilizing surface landmarks and AP and lateral fluoroscopy. I used the hand drill to drill in an upper and outward direction into the L3 pedicle, and then tapped line to line, place 50 by 35 mm pedicle screws at L3 bilaterally. Their placement was confirmed with AP lateral fluoroscopy and then I turned my attention to the decompression. The decompression was quite difficult because of the previous laminectomy and the development of large bilateral synovial cysts. The scar was dense and thick. I took great care to tease the scar away from the bony edges and remove the synovial cysts as best I could. the spinous process was removed and complete lumbar laminectomies, hemi- facetectomies, and foraminotomies were performed at L3-4. The remaining yellow ligament and the scar and synovial cyst was removed to expose the underlying dura and nerve roots, and generous foraminotomies were performed to adequately decompress the neural elements. At no time did I notice CSF leaking from the decompression site. Once the decompression was complete, I turned my attention to the posterior lower lumbar interbody fusion. The epidural venous vasculature was coagulated and cut sharply. Disc space was incised and the initial discectomy was performed with pituitary rongeurs. The disc space was distracted with sequential distractors to a height of 10 mm. We then used a series of scrapers and shavers to prepare the endplates for fusion. The midline was prepared with Epstein curettes. Once the complete discectomy was finished, we packed an appropriate sized peek interbody cage with local autograft and morcellized allograft, gently retracted the nerve root, and tapped  the cage into position at L3-4  bilaterally.  The midline was packed with morselized autograft and allograft. We then turned our attention to the posterior fixation at L4. The pedicle screw entry zones were identified utilizing surface landmarks and fluoroscopy. We probed each pedicle with the pedicle probe and tapped each pedicle with the appropriate tap. We palpated with a ball probe to assure no break in the cortex. We then placed 50 by 35 mm pedicle screws into the pedicles bilaterally at L4. We then decorticated the transverse processes and laid a mixture of morcellized autograft and allograft out over these to perform intertransverse arthrodesis at L 34 on the left. We then placed lordotic rods into the multiaxial screw heads of the pedicle screws and locked these in position with the locking caps and anti-torque device. We achieved compression of our grafts. We then checked our construct with AP and lateral fluoroscopy. Irrigated with copious amounts of bacitracin-containing saline solution. Placed a medium Hemovac drain through separate stab incision. Inspected the nerve roots once again to assure adequate decompression, lined to the dura with Gelfoam, and closed the muscle and the fascia with 0 Vicryl. Closed the subcutaneous tissues with 2-0 Vicryl and subcuticular tissues with 3-0 Vicryl. The skin was closed with benzoin and Steri-Strips. Dressing was then applied, the patient was awakened from general anesthesia and transported to the recovery room in stable condition. At the end of the procedure all sponge, needle and instrument counts were correct.   PLAN OF CARE: Admit to inpatient   PATIENT DISPOSITION:  PACU - hemodynamically stable.   Delay start of Pharmacological VTE agent (>24hrs) due to surgical blood loss or risk of bleeding:  yes

## 2012-08-28 NOTE — Anesthesia Postprocedure Evaluation (Signed)
  Anesthesia Post-op Note  Patient: Alan [name]  Procedure(s) Performed: Procedure(s): Lumbar three-four MAXIMUM ACCESS (MAS) POSTERIOR LUMBAR INTERBODY FUSION (PLIF) 1 LEVEL, REMOVAL OF AFFIX PLATE (N/A)  Patient Location: PACU  Anesthesia Type:General  Level of Consciousness: awake, alert , sedated and patient cooperative  Airway and Oxygen Therapy: Patient Spontanous Breathing and Patient connected to nasal cannula oxygen  Post-op Pain: mild  Post-op Assessment: Post-op Vital signs reviewed, Patient's Cardiovascular Status Stable, Respiratory Function Stable, Patent Airway, No signs of Nausea or vomiting and Pain level controlled  Post-op Vital Signs: Reviewed and stable  Complications: No apparent anesthesia complications

## 2012-08-28 NOTE — Progress Notes (Signed)
Patient ID: Alan [name], male   DOB: 10-Dec-1940, 72 y.o.   MRN: [id number] Doing great postop. Back is appropriately sore. No leg pain or numbness or tingling. He seems very pleased.

## 2012-08-28 NOTE — Progress Notes (Signed)
UR COMPLETED  

## 2012-08-28 NOTE — Anesthesia Procedure Notes (Signed)
Procedure Name: Intubation Date/Time: 08/28/2012 8:48 AM Performed by: Carola Frost Pre-anesthesia Checklist: Patient identified, Timeout performed, Emergency Drugs available, Suction available and Patient being monitored Patient Re-evaluated:Patient Re-evaluated prior to inductionOxygen Delivery Method: Circle system utilized Preoxygenation: Pre-oxygenation with 100% oxygen Intubation Type: IV induction Ventilation: Two handed mask ventilation required Laryngoscope Size: Mac and 4 Grade View: Grade III Tube type: Oral Tube size: 7.5 mm Number of attempts: 1 Airway Equipment and Method: Stylet Placement Confirmation: positive ETCO2,  ETT inserted through vocal cords under direct vision and breath sounds checked- equal and bilateral Secured at: 23 cm Tube secured with: Tape Dental Injury: Teeth and Oropharynx as per pre-operative assessment

## 2012-08-28 NOTE — Progress Notes (Signed)
Orthopedic Tech Progress Note Patient Details:  Alan [name] 11/10/1940 [id number] Brace completed by bio-tech vendor Patient ID: Alan [name], male   DOB: March 22, 1940, 72 y.o.   MRN: [id number]   Alan [name] 08/28/2012, 7:00 PM

## 2012-08-28 NOTE — OR Nursing (Signed)
Nuvasive needle electrodes applied by Bryna Colander RN and Zadie Cleverly RN prior to positioning

## 2012-08-28 NOTE — Preoperative (Signed)
Beta Blockers   Reason not to administer Beta Blockers:Not Applicable, pt took coreg 7/23

## 2012-08-28 NOTE — Transfer of Care (Signed)
Immediate Anesthesia Transfer of Care Note  Patient: Alan [name]  Procedure(s) Performed: Procedure(s): Lumbar three-four MAXIMUM ACCESS (MAS) POSTERIOR LUMBAR INTERBODY FUSION (PLIF) 1 LEVEL, REMOVAL OF AFFIX PLATE (N/A)  Patient Location: PACU  Anesthesia Type:General  Level of Consciousness: awake, alert  and oriented  Airway & Oxygen Therapy: Patient Spontanous Breathing and Patient connected to nasal cannula oxygen  Post-op Assessment: Report given to PACU RN, Post -op Vital signs reviewed and stable and Patient moving all extremities X 4  Post vital signs: Reviewed and stable  Complications: No apparent anesthesia complications

## 2012-08-29 MED ORDER — OXYCODONE HCL 10 MG PO TABS: 10.0000 mg | ORAL_TABLET | ORAL | Status: DC | PRN

## 2012-08-29 NOTE — Evaluation (Signed)
I agree with the following treatment note after reviewing documentation.   Johnston, Yamna Mackel Brynn   OTR/L Pager: [phone] Office: [phone] .   

## 2012-08-29 NOTE — Plan of Care (Signed)
Dc instructions given to pt. Pt is alert and oriented x 4. Pt is stable to be d/c. Med script given to pt. Instructed pt on how to take med, v/u.

## 2012-08-29 NOTE — Discharge Summary (Signed)
Physician Discharge Summary  Patient ID: Alan [name] MRN: [id number] DOB/AGE: [birth year] 72 y.o.  Admit date: 08/28/2012 Discharge date: 08/29/2012  Admission Diagnoses: recurrent stenosis L3-4    Discharge Diagnoses: same   Discharged Condition: good  Hospital Course: The patient was admitted on 08/28/2012 and taken to the operating room where the patient underwent PLIF L3-4. The patient tolerated the procedure well and was taken to the recovery room and then to the floor in stable condition. The hospital course was routine. There were no complications. The wound remained clean dry and intact. Pt had appropriate back soreness. No complaints of leg pain or new N/T/W. The patient remained afebrile with stable vital signs, and tolerated a regular diet. The patient continued to increase activities, and pain was well controlled with oral pain medications.   Consults: None  Significant Diagnostic Studies:  Results for orders placed during the hospital encounter of 08/28/12  PROTIME-INR      Result Value Range   Prothrombin Time 12.5  11.6 - 15.2 seconds   INR 0.95  0.00 - 1.49  GLUCOSE, CAPILLARY      Result Value Range   Glucose-Capillary 97  70 - 99 mg/dL  GLUCOSE, CAPILLARY      Result Value Range   Glucose-Capillary 96  70 - 99 mg/dL  GLUCOSE, CAPILLARY      Result Value Range   Glucose-Capillary 136 (*) 70 - 99 mg/dL  TYPE AND SCREEN      Result Value Range   ABO/RH(D) A POS     Antibody Screen NEG     Sample Expiration 08/31/2012      Dg Lumbar Spine 2-3 Views  08/28/2012   *RADIOLOGY REPORT*  Clinical Data: Lumbar fusion surgery  DG C-ARM 61-120 MIN,LUMBAR SPINE - 2-3 VIEW  Comparison: 07/09/2012  Findings: Two fluoroscopic spot images document removal of the interspinous plate, placement of bilateral pedicle screws at L3 and L4 with vertical interconnecting hardware.  Graft markers project in the interspace.  IMPRESSION:  PLIF L3-4   Original Report Authenticated By: D.  Wallace Going, MD   Dg C-arm 61-120 Min  08/28/2012   *RADIOLOGY REPORT*  Clinical Data: Lumbar fusion surgery  DG C-ARM 61-120 MIN,LUMBAR SPINE - 2-3 VIEW  Comparison: 07/09/2012  Findings: Two fluoroscopic spot images document removal of the interspinous plate, placement of bilateral pedicle screws at L3 and L4 with vertical interconnecting hardware.  Graft markers project in the interspace.  IMPRESSION:  PLIF L3-4   Original Report Authenticated By: D. Wallace Going, MD    Antibiotics:  Anti-infectives   Start     Dose/Rate Route Frequency Ordered Stop   08/28/12 1700  ceFAZolin (ANCEF) IVPB 1 g/50 mL premix     1 g 100 mL/hr over 30 Minutes Intravenous Every 8 hours 08/28/12 1340 08/29/12 0103   08/28/12 0808  bacitracin 50000 UNITS injection    Comments:  RATCLIFF, ESTHER: cabinet override      08/28/12 0808 08/28/12 2014   08/28/12 0800  bacitracin 50,000 Units in sodium chloride irrigation 0.9 % 500 mL irrigation  Status:  Discontinued       As needed 08/28/12 0940 08/28/12 1201   08/28/12 0600  ceFAZolin (ANCEF) IVPB 2 g/50 mL premix     2 g 100 mL/hr over 30 Minutes Intravenous On call to O.R. 08/27/12 1413 08/28/12 0855   08/02/12 0600  ceFAZolin (ANCEF) IVPB 2 g/50 mL premix     2 g 100 mL/hr over 30 Minutes Intravenous  On call to O.R. 07/23/12 1531 08/03/12 0559      Discharge Exam: Blood pressure 110/51, pulse 59, temperature 98.2 F (36.8 C), temperature source Oral, resp. rate 20, SpO2 98.00%. Neurologic: Grossly normal Incision cdi  Discharge Medications:     Medication List         amLODipine 10 MG tablet  Commonly known as:  NORVASC  Take 5 mg by mouth 2 (two) times daily.     aspirin EC 81 MG tablet  Take 81 mg by mouth daily.     carvedilol 25 MG tablet  Commonly known as:  COREG  Take 25-37.5 mg by mouth 2 (two) times daily with a meal. Take 1.5mg  in the morning and 1 tablet at night     CINNAMON PLUS CHROMIUM PO  Take 1 tablet by mouth at bedtime.      fish oil-omega-3 fatty acids 1000 MG capsule  Take 1 g by mouth every morning.     Flaxseed (Linseed) 1300 MG Caps  Take 1,300 mg by mouth daily.     furosemide 40 MG tablet  Commonly known as:  LASIX  Take 20 mg by mouth daily.     glipiZIDE 10 MG tablet  Commonly known as:  GLUCOTROL  Take 10 mg by mouth daily as needed (for high blood surgars). For high blood sugars     hydrALAZINE 50 MG tablet  Commonly known as:  APRESOLINE  Take 25 mg by mouth at bedtime.     insulin glargine 100 UNIT/ML injection  Commonly known as:  LANTUS  Inject 20 Units into the skin daily as needed (elevated blood sugars).     lisinopril-hydrochlorothiazide 20-25 MG per tablet  Commonly known as:  PRINZIDE,ZESTORETIC  Take 1 tablet by mouth daily.     metFORMIN 500 MG tablet  Commonly known as:  GLUCOPHAGE  Take 500 mg by mouth 4 (four) times daily.     multivitamin with minerals Tabs  Take 1 tablet by mouth every morning.     nitroGLYCERIN 0.4 MG SL tablet  Commonly known as:  NITROSTAT  Place 0.4 mg under the tongue every 5 (five) minutes as needed. For chest pain     Oxycodone HCl 10 MG Tabs  Take 1 tablet (10 mg total) by mouth every 4 (four) hours as needed.     warfarin 5 MG tablet  Commonly known as:  COUMADIN  Take 5-7.5 mg by mouth daily. Tues & Thurs-1.5 tab (to total 7.5 mg); Allo other days  1 tab (to total 5 mg)        Disposition: home   Final Dx: PLIF L3-4      Discharge Orders   Future Appointments Provider Department Dept Phone   09/16/2012 6:00 AM [facility] [employer] [facility] [place] [phone]   Future Orders Complete By Expires     Call MD for:  difficulty breathing, headache or visual disturbances  As directed     Call MD for:  persistant nausea and vomiting  As directed     Call MD for:  redness, tenderness, or signs of infection (pain, swelling, redness, odor or green/yellow discharge around incision site)  As  directed     Call MD for:  severe uncontrolled pain  As directed     Call MD for:  temperature >100.4  As directed     Diet - low sodium heart healthy  As directed     Discharge instructions  As directed  Comments:      No driving, no bending, no twisting, no lifting more than 10 lbs    Increase activity slowly  As directed        Follow-up Information   Follow up with Murielle Stang S, MD. Schedule an appointment as soon as possible for a visit in 2 weeks.   Contact information:   [address] [place] [address] [phone]        Signed: [name] 08/29/2012, 11:16 AM

## 2012-08-29 NOTE — Evaluation (Signed)
Occupational Therapy Evaluation and Discharge Patient Details Name: Alan [name] MRN: [id number] DOB: 1940-09-27 Today's Date: 08/29/2012 Time: [id number] OT Time Calculation (min): 33 min  OT Assessment / Plan / Recommendation History of present illness s/p PLIF of L3-4 with Brace   Clinical Impression   Pt was walking around the room when OTS entered. Pt modified independent for mobility and ADL. Pt educated on precautions and given handout. Patient evaluated by Occupational Therapy with no further acute OT needs identified. All education has been completed and the patient has no further questions. See below for any follow-up Occupational Therapy or equipment needs. OT to sign off. Thank you for referral.     OT Assessment  Patient does not need any further OT services    Follow Up Recommendations  No OT follow up       Equipment Recommendations  None recommended by OT          Precautions / Restrictions Precautions Precautions: Back Precaution Booklet Issued: Yes (comment) Precaution Comments: BAT handout reviewed Required Braces or Orthoses: Spinal Brace Spinal Brace: Lumbar corset;Applied in sitting position Restrictions Weight Bearing Restrictions: No   Pertinent Vitals/Pain Pt reported 1.5 out of 10 pain.    ADL  Eating/Feeding: Independent Where Assessed - Eating/Feeding: Chair Grooming: Wash/dry hands;Wash/dry face;Teeth care;Modified independent Where Assessed - Grooming: Unsupported standing Upper Body Bathing: Modified independent Where Assessed - Upper Body Bathing: Unsupported standing Lower Body Bathing: Supervision/safety Where Assessed - Lower Body Bathing: Unsupported sitting Upper Body Dressing: Modified independent Where Assessed - Upper Body Dressing: Unsupported sitting Lower Body Dressing: Supervision/safety Where Assessed - Lower Body Dressing: Unsupported sit to stand Toilet Transfer: Modified independent Toilet Transfer Method: Sit to  Loss adjuster, chartered: Comfort height toilet Toileting - Clothing Manipulation and Hygiene: Modified independent Where Assessed - Best boy and Hygiene: Standing Tub/Shower Transfer: Modified independent Tub/Shower Transfer Method: Ambulating Equipment Used: Back brace;Gait belt Transfers/Ambulation Related to ADLs: Pt supervision with min vc's for safe handplacement and education with precautions ADL Comments: Pt walking around room in brace when OTS enetered room. Pt able to perform BUE grooming tasks at sink level maintaining precautions. Pt educated on cup method for brushing teeth. Pt able to perform toilet transfer and peri care. Pt given BAT handout and reviewed/educated on precautions.      OT Goals(Current goals can be found in the care plan section) Acute Rehab OT Goals Patient Stated Goal: "To get home and spend time with his grandchildren and fly again"  Visit Information  Last OT Received On: 08/29/12 Assistance Needed: +1 History of Present Illness: s/p PLIF of L3-4 with Brace       Prior [place] expects to be discharged to:: Private residence Living Arrangements: Alone Available Help at Discharge: Family;Available 24 hours/day Type of Home: House Home Access: Level entry Home Layout: One level Home Equipment: Cane - single point;Crutches Additional Comments: Pt has a cat that he cares for Prior Function Level of Independence: Independent Comments: Driving, flying airplanes, working (owns and operates catering business) Communication Communication: No difficulties Dominant Hand: Right         Vision/Perception Vision - History Baseline Vision: Wears glasses all the time Patient Visual Report: No change from baseline Vision - Assessment Eye Alignment: Within Functional Limits Vision Assessment: Vision not tested   [employer] Arousal/Alertness: Awake/alert Behavior During  Therapy: WFL for tasks assessed/performed Overall Cognitive Status: Within Functional Limits for tasks assessed  Extremity/Trunk Assessment Upper Extremity Assessment Upper Extremity Assessment: Overall WFL for tasks assessed Lower Extremity Assessment Lower Extremity Assessment: Overall WFL for tasks assessed Cervical / Trunk Assessment Cervical / Trunk Assessment: Normal     Mobility Bed Mobility Bed Mobility: Not assessed Transfers Transfers: Sit to Stand;Stand to Sit Sit to Stand: 6: Modified independent (Device/Increase time);With upper extremity assist;From chair/3-in-1;From toilet Stand to Sit: 6: Modified independent (Device/Increase time);With upper extremity assist;To bed;To toilet Details for Transfer Assistance: min vc's for safe handplacement     Subjective Data  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      End of Session OT - End of Session Equipment Utilized During Treatment: Gait belt;Back brace Activity Tolerance: Patient tolerated treatment well Patient left: in chair;with call bell/phone within reach Nurse Communication: Mobility status  GO     Hulda Humphrey 08/29/2012, 10:23 AM

## 2012-09-11 ENCOUNTER — Other Ambulatory Visit: Payer: Self-pay

## 2012-09-24 ENCOUNTER — Other Ambulatory Visit: Payer: Self-pay | Admitting: *Deleted

## 2012-09-24 MED ORDER — AMLODIPINE BESYLATE 10 MG PO TABS: 5.0000 mg | ORAL_TABLET | Freq: Two times a day (BID) | ORAL | Status: DC

## 2012-10-01 ENCOUNTER — Telehealth: Payer: Self-pay | Admitting: Internal Medicine

## 2012-10-01 NOTE — Telephone Encounter (Signed)
Per CY: could TP see pt?  Called spoke with patient, offered ov w/ TP 8.28.14 @ 1045 >> appt scheduled.  Pt aware to call for sooner follow up if symptoms worsen prior to ov.  Pt verbalized his understanding.  Nothing further needed; will sign off.

## 2012-10-01 NOTE — Telephone Encounter (Signed)
Called, spoke with pt - He had back surgery x 4 wks ago.  C/o raspy throat, coughing all the time, and SOB when talking since surgery.  Cough was nonprod until today with a small amount of green mucus.  No wheezing, chest tightness, chest pain, or PND.  Reports he's had a cxr done at [facility] and was advised it was ok.  Has taken 3 rounds of abx with no relief.  Is using allergy relief with some relief for part of the day.  He was last seen by CDY on 03/02/2011 for OSA.  Pt requesting appt with CDY this week for cough.  Nothing open - Dr. Annamaria Boots, pls advise.  Thank you.  No Known Allergies

## 2012-10-02 ENCOUNTER — Encounter: Payer: Self-pay | Admitting: *Deleted

## 2012-10-03 ENCOUNTER — Ambulatory Visit (INDEPENDENT_AMBULATORY_CARE_PROVIDER_SITE_OTHER): Payer: Medicare Other | Admitting: Adult Health

## 2012-10-03 ENCOUNTER — Encounter: Payer: Self-pay | Admitting: Adult Health

## 2012-10-03 VITALS — BP 122/56 | HR 73 | Temp 97.3°F | Ht 70.0 in | Wt 216.8 lb

## 2012-10-03 DIAGNOSIS — R05 Cough: Secondary | ICD-10-CM

## 2012-10-03 DIAGNOSIS — R059 Cough, unspecified: Secondary | ICD-10-CM

## 2012-10-03 NOTE — Assessment & Plan Note (Addendum)
Persistent cough ? ACE inhibitor is contributing to poor resolution Will stop ACE as he is on minimal dose at this time He is on diuretic as well with lasix so will not restart HCTZ He is seeing cardilogy next week for .follow up  Will send notes.   Plan  Stop Lisinopril HCTZ .  Would avoid ACE Inhibitors in future due to cough.  Discuss blood pressure next week with Cardiology follow up .  USe Delsym 2 tsp Twice daily  For cough.  Tessalon Three times a day  As needed   Avoid Mint products.  Stop fish oil, flax seed oil for 6 weeks then resume.  follow up Dr. Annamaria Boots  In 6 weeks and As needed   Please contact office for sooner follow up if symptoms do not improve or worsen or seek emergency care

## 2012-10-03 NOTE — Progress Notes (Signed)
03/02/11 70 yoM never smoker followed for OSA, history UPPP. Complicated by Post polio, AFib/ pacemaker   PCP Dr Melford Aase LOV-Jan 28, 2010 He has continued using CPAP 11/Advanced all night every night. He denies snoring or daytime tiredness. He likes his nasal pillows mask.  10/03/2012 Acute OV  Complains DOE, hoarseness, dry cough since back surgery 4weeks ago.  has been on cefdinir and augmentin (finished this 8/25). Seen by PCP x 2 with neg cxr per pt. No records available (records requested) .  No fever, discolored mucus, chest pain , calf pain or swelling.  Cough is driving him crazy, dry cough all the time.  Is on an ace inhibitor, lisinopril 20mg  (1/4 tab ) with low /nml b/p.  No weight loss, hemoptysis.     ROS-HPI Constitutional:   No-   weight loss, night sweats, fevers, chills, fatigue, lassitude. HEENT:   No-  headaches, difficulty swallowing, tooth/dental problems, sore throat,       No-  sneezing, itching, ear ache, nasal congestion, post nasal drip,  CV:  No-   chest pain, orthopnea, PND, swelling in lower extremities, anasarca, dizziness, palpitations Resp: No-   shortness of breath with exertion or at rest.              No-   No- coughing up of blood.              No-   change in color of mucus.  No- wheezing.   Skin: No-   rash or lesions. GI:  No-   heartburn, indigestion, abdominal pain, nausea, vomiting, diarrhea,                 change in bowel habits, loss of appetite GU:  MS:  No-   joint pain or swelling.  No- decreased range of motion.  No- back pain. Neuro-     nothing unusual Psych:  No- change in mood or affect. No depression or anxiety.  No memory loss.  OBJ General- Alert, Oriented, Affect-appropriate, Distress- none acute, overweight Skin- rash-none, lesions- none, excoriation- none Lymphadenopathy- none Head- atraumatic            Eyes- Gross vision intact, PERRLA, conjunctivae clear secretions            Ears- Hearing, canals-normal            Nose-  Clear, no-Septal dev, mucus, polyps, erosion, perforation             Throat- Mallampati III/ sp UPPP , mucosa clear , drainage- none, tonsils- atrophic Neck- flexible , trachea midline, no stridor , thyroid nl, carotid no bruit Chest - symmetrical excursion , unlabored           Heart/CV- RRR to palpation , no murmur , no gallop  , no rub, nl s1 s2                           - JVD- none , edema- none, stasis changes- none, varices- none           Lung- clear to P&A, wheeze- none, cough- none , dullness-none, rub- none           Chest wall-  Abd- Br/ Gen/ Rectal- Not done, not indicated Extrem- cyanosis- none, clubbing, none, atrophy- none, strength- nl Neuro- grossly intact to observation

## 2012-10-03 NOTE — Patient Instructions (Addendum)
Stop Lisinopril HCTZ .  Would avoid ACE Inhibitors in future due to cough.  Discuss blood pressure next week with Cardiology follow up .  USe Delsym 2 tsp Twice daily  For cough.  Tessalon Three times a day  As needed   Avoid Mint products.  Stop fish oil, flax seed oil for 6 weeks then resume.  follow up Dr. Annamaria Boots  In 6 weeks and As needed   Please contact office for sooner follow up if symptoms do not improve or worsen or seek emergency care

## 2012-10-04 ENCOUNTER — Encounter: Payer: Self-pay | Admitting: Cardiovascular Disease

## 2012-10-08 ENCOUNTER — Encounter: Payer: Self-pay | Admitting: Cardiovascular Disease

## 2012-10-08 ENCOUNTER — Ambulatory Visit (INDEPENDENT_AMBULATORY_CARE_PROVIDER_SITE_OTHER): Payer: Medicare Other | Admitting: Cardiovascular Disease

## 2012-10-08 VITALS — BP 100/56 | HR 71 | Ht 70.0 in | Wt 222.1 lb

## 2012-10-08 DIAGNOSIS — I2510 Atherosclerotic heart disease of native coronary artery without angina pectoris: Secondary | ICD-10-CM

## 2012-10-08 DIAGNOSIS — G4733 Obstructive sleep apnea (adult) (pediatric): Secondary | ICD-10-CM

## 2012-10-08 DIAGNOSIS — E785 Hyperlipidemia, unspecified: Secondary | ICD-10-CM

## 2012-10-08 DIAGNOSIS — I255 Ischemic cardiomyopathy: Secondary | ICD-10-CM

## 2012-10-08 DIAGNOSIS — I2589 Other forms of chronic ischemic heart disease: Secondary | ICD-10-CM

## 2012-10-08 DIAGNOSIS — I472 Ventricular tachycardia: Secondary | ICD-10-CM

## 2012-10-08 DIAGNOSIS — E119 Type 2 diabetes mellitus without complications: Secondary | ICD-10-CM

## 2012-10-08 DIAGNOSIS — B91 Sequelae of poliomyelitis: Secondary | ICD-10-CM

## 2012-10-08 DIAGNOSIS — I455 Other specified heart block: Secondary | ICD-10-CM

## 2012-10-08 DIAGNOSIS — I4729 Other ventricular tachycardia: Secondary | ICD-10-CM

## 2012-10-08 DIAGNOSIS — I442 Atrioventricular block, complete: Secondary | ICD-10-CM

## 2012-10-08 DIAGNOSIS — Z8679 Personal history of other diseases of the circulatory system: Secondary | ICD-10-CM

## 2012-10-08 DIAGNOSIS — Z950 Presence of cardiac pacemaker: Secondary | ICD-10-CM

## 2012-10-08 DIAGNOSIS — I4891 Unspecified atrial fibrillation: Secondary | ICD-10-CM

## 2012-10-08 DIAGNOSIS — I10 Essential (primary) hypertension: Secondary | ICD-10-CM

## 2012-10-08 LAB — PACEMAKER DEVICE OBSERVATION

## 2012-10-08 MED ORDER — FUROSEMIDE 40 MG PO TABS: 20.0000 mg | ORAL_TABLET | Freq: Every day | ORAL | Status: DC

## 2012-10-08 MED ORDER — AMLODIPINE BESYLATE 10 MG PO TABS: 10.0000 mg | ORAL_TABLET | Freq: Two times a day (BID) | ORAL | Status: DC

## 2012-10-08 NOTE — Patient Instructions (Addendum)
Your physician has recommended you make the following change in your medication: DISCONTINUE HYDRALAZINE AND TAKE CARVEDILOL 25 MG TWICE DAILY  Your physician recommends that you schedule a follow-up appointment in: 3-4 WEEKS

## 2012-10-08 NOTE — Progress Notes (Signed)
In office pacemaker interrogation. Normal device function. No changes made this session. 

## 2012-10-09 LAB — PACEMAKER DEVICE OBSERVATION
AL IMPEDENCE PM: 424 Ohm
AL THRESHOLD: 0.75 V
BATTERY VOLTAGE: 2.8 V
RV LEAD AMPLITUDE: 22.4 mv
VENTRICULAR PACING PM: 0.7

## 2012-10-13 ENCOUNTER — Encounter: Payer: Self-pay | Admitting: Cardiovascular Disease

## 2012-10-13 DIAGNOSIS — I442 Atrioventricular block, complete: Secondary | ICD-10-CM | POA: Insufficient documentation

## 2012-10-13 NOTE — Progress Notes (Signed)
Patient ID: Alan [name], male   DOB: 12-06-1940, 72 y.o.   MRN: [id number]     Reason for office visit Followup coronary disease, ischemic cardiomyopathy, nonsustained VT, paroxysmal atrial fib, complete heart block and pacemaker check  From a cardiovascular standpoint, Alan [name] has done well since his last appointment. On July 23 she underwent lumbar spine surgery for recurrent L3-L4 spinal stenosis. The procedure went well but he has been troubled by persistent cough ever since. He had a mildly sore throat immediately postoperatively but otherwise no surgical problems. She has been coughing for the last 5 weeks. He saw Dr. Annamaria Boots. His ACE inhibitor was discontinued about a week ago, but the cough is continuing. He was also advised is avoid fish oil and other volatile oils for 6 weeks.  She also complains of worsening dizziness over the last month and today his blood pressure was fairly low. He has not lost any weight. The dizziness is associated with profound weakness and is worse in the morning not long after he takes his blood pressure medications.    No Known Allergies  Current Outpatient Prescriptions  Medication Sig Dispense Refill  . amLODipine (NORVASC) 10 MG tablet Take 1 tablet (10 mg total) by mouth 2 (two) times daily. 1/4 tab by mouth twice daily  90 tablet  3  . aspirin EC 81 MG tablet Take 81 mg by mouth daily.      . carvedilol (COREG) 25 MG tablet Take 1.5mg  in the morning and 1 tablet at night      . Chromium-Cinnamon (CINNAMON PLUS CHROMIUM PO) Take 1 tablet by mouth at bedtime.       . fish oil-omega-3 fatty acids 1000 MG capsule Take 1 g by mouth every morning.      . Flaxseed, Linseed, 1300 MG CAPS Take 1,300 mg by mouth daily.      . furosemide (LASIX) 40 MG tablet Take 0.5 tablets (20 mg total) by mouth daily.  90 tablet  3  . glipiZIDE (GLUCOTROL) 10 MG tablet Take 10 mg by mouth daily as needed (for high blood surgars). For high blood sugars      . insulin glargine  (LANTUS) 100 UNIT/ML injection Inject 20 Units into the skin daily as needed (elevated blood sugars).       [name] Kitchen lisinopril-hydrochlorothiazide (PRINZIDE,ZESTORETIC) 20-25 MG per tablet Take 1 tablet by mouth daily.      . metFORMIN (GLUCOPHAGE) 500 MG tablet Take 500 mg by mouth 4 (four) times daily.      . Multiple Vitamin (MULTIVITAMIN WITH MINERALS) TABS Take 1 tablet by mouth every morning.      . nitroGLYCERIN (NITROSTAT) 0.4 MG SL tablet Place 0.4 mg under the tongue every 5 (five) minutes as needed. For chest pain      . warfarin (COUMADIN) 5 MG tablet Take 5-7.5 mg by mouth daily. Tues & Thurs-1.5 tab (to total 7.5 mg); Allo other days  1 tab (to total 5 mg)       No current facility-administered medications for this visit.    Past Medical History  Diagnosis Date  . Diabetes mellitus   . A-fib   . Complete heart block   . Coronary artery disease     seen at [facility] cardiology   . Hypertension     Dr. Veronia Beets, primary  . Sleep apnea     cpap, 11, last sleep study Jan2013, sees Dr. Keturah Barre  . Neuromuscular disorder     carpal tunnel  bilaterally  . Polio     hx of  . Chronic back pain   . Pacemaker 03/07/2012    replaced  . Status post placement of cardiac pacemaker, 03/07/12 New Medtronic Gen and new Vent lead original placed 2008 03/08/2012  . Anticoagulant long-term use, on coumadin for PAF 03/08/2012  . Hx of echocardiogram 06/29/2008    EF 45-50%     Past Surgical History  Procedure Laterality Date  . Coronary artery bypass graft  1990  . Stents    . Coronary artery bypass graft  2004  . Maze procedure  2004  . Pacemaker insertion  08, 1/14  . Back surgery    . Cardiac catheterization    . Tonsillectomy    . Appendectomy    . Hernia repair    . Lumbar laminectomy/decompression microdiscectomy  01/10/2012    Procedure: LUMBAR LAMINECTOMY/DECOMPRESSION MICRODISCECTOMY 1 LEVEL;  Surgeon: [name], MD;  Location: [place] [facility];  Service: Neurosurgery;   Laterality: Bilateral;  Lumbar three-four decompression, Posterior lateral fusion lumbar three-four, posterior spinus plate lumbar three-four  . Insert / replace / remove pacemaker  03/07/2012    replaced  . Joint replacement      Hx: of left knee  . Fracture surgery      Hx: of right leg  . Nasal septum surgery N/A 14  . Posterior fusion lumbar spine  08/28/2012    Dr Ronnald Ramp    Family History  Problem Relation Age of Onset  . Other Father     MVA  . Cancer Mother     History   Social History  . Marital Status: Single    Spouse Name: N/A    Number of Children: N/A  . Years of Education: N/A   Occupational History  . pilot-retired    Social History Main Topics  . Smoking status: Never Smoker   . Smokeless tobacco: Never Used  . Alcohol Use: No  . Drug Use: No  . Sexual Activity: Yes   Other Topics Concern  . Not on file   Social History Narrative   [place] [address] [place] [facility], fishes.     Review of systems: Persistent nonproductive cough, dizziness He denies dyspnea, angina, syncope, palpitations, lower showed edema, abdominal pain, nausea, vomiting, gastrointestinal bleeding, urinary complaints, focal neurological deficits, rashes, change in mood, intolerance to heat or cold, polyuria, polydipsia, unusual bleeding.  PHYSICAL EXAM BP 100/56  Pulse 71  Ht 5\' 10"  (1.778 m)  Wt 222 lb 1.6 oz (100.744 kg)  BMI 31.87 kg/m2  General: Alert, oriented x3, no distress, mildly obese Head: no evidence of trauma, PERRL, EOMI, no exophtalmos or lid lag, no myxedema, no xanthelasma; normal ears, nose and oropharynx Neck: normal jugular venous pulsations and no hepatojugular reflux; brisk carotid pulses without delay and no carotid bruits Chest: clear to auscultation, no signs of consolidation by percussion or palpation, normal fremitus, symmetrical and full respiratory excursions Cardiovascular: normal position and quality of the apical impulse, regular rhythm,  normal first and second heart sounds, no murmurs, rubs or gallops Abdomen: no tenderness or distention, no masses by palpation, no abnormal pulsatility or arterial bruits, normal bowel sounds, no hepatosplenomegaly Extremities: no clubbing, cyanosis or edema; 2+ radial, ulnar and brachial pulses bilaterally; 2+ right femoral, posterior tibial and dorsalis pedis pulses; 2+ left femoral, posterior tibial and dorsalis pedis pulses; no subclavian or femoral bruits Neurological: grossly nonfocal except for atrophy of right lower extremity as part of  post polio syndrome   EKG: Atrial paced rhythm, old inferoposterior myocardial infarction, no acute ST changes  Lipid Panel     Component Value Date/Time   CHOL 134 12/20/2010 0903   TRIG 104 12/20/2010 0903   HDL 38* 12/20/2010 0903   CHOLHDL 3.5 12/20/2010 0903   VLDL 21 12/20/2010 0903   LDLCALC 75 12/20/2010 0903    BMET    Component Value Date/Time   NA 137 08/22/2012 1029   K 4.0 08/22/2012 1029   CL 99 08/22/2012 1029   CO2 27 08/22/2012 1029   GLUCOSE 138* 08/22/2012 1029   BUN 28* 08/22/2012 1029   CREATININE 1.33 08/22/2012 1029   CALCIUM 10.2 08/22/2012 1029   GFRNONAA 53* 08/22/2012 1029   GFRAA 61* 08/22/2012 1029     ASSESSMENT AND PLAN Hypertension Blood pressure is borderline low today and he has symptoms of hypotension. I recommended that he decrease the carvedilol to 25 mg twice a day. I am also not sure that the nighttime hydralazine released serves a purpose as a once daily medication. I've advised him to stop the hydralazine.  Non-sustained ventricular tachycardia Asymptomatic events have been recorded by his pacemaker at infrequent intervals appear  Ischemic cardiomyopathy Estimated ejection fraction of 45-50% . Does not have manifestations of congestive heart failure. While it may be necessary to reduce the dose of beta blocker, this is an extremely important medication for his long-term management. Similarly, while his  ACE inhibitor might be temporarily stopped because of his cough it should be resumed once his problems are clarified. She has been taking an ACE inhibitor for several years without cough as a side effect. It may be contributing to the maintenance of the cough following airway irritation at the time of surgery, but it is definitely not the cause.  Dyslipidemia Statin intolerance  History of complete heart block Pacemaker dependent  Status post placement of cardiac pacemaker, 03/07/12 New Medtronic Gen and new Vent lead due to ERI and poorly functioning lead original placed 2008 Normally functioning pacemaker, 100% atrial paced, ventricular sensed rhythm. While the device was initially implanted for paroxysmal complete heart block, he has subsequently developed complete sinus arrest and has normal AV conduction. Four very brief episodes of nonsustained ventricular tachycardia have been recorded. No permanent changes are made to device settings today.  ATRIAL FIBRILLATION, PAROXYSMAL He underwent a Maze procedure at the time of his redo bypass surgery in 2004, but his pacemaker has shown occasional episodes of paroxysmal atrial fibrillation. He should continue warfarin anticoagulation barring any serious bleeding complications  CAD Coronary bypass surgery 1990, redo bypass surgery 2004, Cardiac catheterization November 2012 shows occluded LAD artery with patent sequential LIMA bypass to the first diagonal and LAD artery, 50% stenosis in major oblique marginal (OM 2), totally occluded proximal right coronary artery with patent saphenous vein graft to the posterior descending artery, but with subtotal occlusion of the posterior descending artery downstream of anastomosis. Angina and dyspnea free.  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 Pacemaker Device Observation  . EKG 12-Lead   Meds ordered this encounter  Medications  . lisinopril-hydrochlorothiazide (PRINZIDE,ZESTORETIC) 20-25 MG per tablet    Sig: Take 1 tablet by mouth daily.  [name] Kitchen amLODipine (  NORVASC) 10 MG tablet    Sig: Take 1 tablet (10 mg total) by mouth 2 (two) times daily. 1/4 tab by mouth twice daily    Dispense:  90 tablet    Refill:  3  . furosemide (LASIX) 40 MG tablet    Sig: Take 0.5 tablets (20 mg total) by mouth daily.    Dispense:  90 tablet    Refill:  [place] Sophronia Varney, MD, [place] [phone] office [phone] pager

## 2012-10-13 NOTE — Assessment & Plan Note (Signed)
Estimated ejection fraction of 45-50% . Does not have manifestations of congestive heart failure. While it may be necessary to reduce the dose of beta blocker, this is an extremely important medication for his long-term management. Similarly, while his ACE inhibitor might be temporarily stopped because of his cough it should be resumed once his problems are clarified. She has been taking an ACE inhibitor for several years without cough as a side effect. It may be contributing to the maintenance of the cough following airway irritation at the time of surgery, but it is definitely not the cause.

## 2012-10-13 NOTE — Assessment & Plan Note (Signed)
He underwent a Maze procedure at the time of his redo bypass surgery in 2004, but his pacemaker has shown occasional episodes of paroxysmal atrial fibrillation. He should continue warfarin anticoagulation barring any serious bleeding complications

## 2012-10-13 NOTE — Assessment & Plan Note (Signed)
Type II on oral antidiabetic therapy

## 2012-10-13 NOTE — Assessment & Plan Note (Addendum)
Normally functioning pacemaker, 100% atrial paced, ventricular sensed rhythm. While the device was initially implanted for paroxysmal complete heart block, he has subsequently developed complete sinus arrest and has normal AV conduction. Four very brief episodes of nonsustained ventricular tachycardia have been recorded. No permanent changes are made to device settings today.

## 2012-10-13 NOTE — Assessment & Plan Note (Signed)
Statin intolerance 

## 2012-10-13 NOTE — Assessment & Plan Note (Signed)
Asymptomatic events have been recorded by his pacemaker at infrequent intervals appear

## 2012-10-13 NOTE — Assessment & Plan Note (Signed)
Coronary bypass surgery 1990, redo bypass surgery 2004, Cardiac catheterization November 2012 shows occluded LAD artery with patent sequential LIMA bypass to the first diagonal and LAD artery, 50% stenosis in major oblique marginal (OM 2), totally occluded proximal right coronary artery with patent saphenous vein graft to the posterior descending artery, but with subtotal occlusion of the posterior descending artery downstream of anastomosis. Angina and dyspnea free.

## 2012-10-13 NOTE — Assessment & Plan Note (Signed)
Blood pressure is borderline low today and he has symptoms of hypotension. I recommended that he decrease the carvedilol to 25 mg twice a day. I am also not sure that the nighttime hydralazine released serves a purpose as a once daily medication. I've advised him to stop the hydralazine.

## 2012-10-13 NOTE — Assessment & Plan Note (Signed)
History of paroxysmal complete heart block, now with sinus node arrest but intact native AV conduction

## 2012-10-13 NOTE — Assessment & Plan Note (Signed)
History of UPPP surgery, compliant with CPAP

## 2012-10-13 NOTE — Assessment & Plan Note (Signed)
Pacemaker dependent 

## 2012-10-23 ENCOUNTER — Encounter: Payer: Self-pay | Admitting: Cardiovascular Disease

## 2012-10-23 ENCOUNTER — Ambulatory Visit (INDEPENDENT_AMBULATORY_CARE_PROVIDER_SITE_OTHER): Payer: Medicare Other | Admitting: Cardiovascular Disease

## 2012-10-23 VITALS — BP 138/72 | HR 65 | Ht 70.0 in | Wt 218.5 lb

## 2012-10-23 DIAGNOSIS — R079 Chest pain, unspecified: Secondary | ICD-10-CM

## 2012-10-23 DIAGNOSIS — Z950 Presence of cardiac pacemaker: Secondary | ICD-10-CM

## 2012-10-23 DIAGNOSIS — I472 Ventricular tachycardia: Secondary | ICD-10-CM

## 2012-10-23 DIAGNOSIS — I255 Ischemic cardiomyopathy: Secondary | ICD-10-CM

## 2012-10-23 DIAGNOSIS — Z951 Presence of aortocoronary bypass graft: Secondary | ICD-10-CM

## 2012-10-23 DIAGNOSIS — I442 Atrioventricular block, complete: Secondary | ICD-10-CM

## 2012-10-23 DIAGNOSIS — I10 Essential (primary) hypertension: Secondary | ICD-10-CM

## 2012-10-23 DIAGNOSIS — I2589 Other forms of chronic ischemic heart disease: Secondary | ICD-10-CM

## 2012-10-23 DIAGNOSIS — I4891 Unspecified atrial fibrillation: Secondary | ICD-10-CM

## 2012-10-23 DIAGNOSIS — I4729 Other ventricular tachycardia: Secondary | ICD-10-CM

## 2012-10-23 MED ORDER — NITROGLYCERIN 0.4 MG SL SUBL: 0.4000 mg | SUBLINGUAL_TABLET | SUBLINGUAL | Status: DC | PRN

## 2012-10-23 MED ORDER — ISOSORBIDE MONONITRATE ER 30 MG PO TB24: 30.0000 mg | ORAL_TABLET | Freq: Every day | ORAL | Status: DC

## 2012-10-23 MED ORDER — LISINOPRIL-HYDROCHLOROTHIAZIDE 20-12.5 MG PO TABS: 1.0000 | ORAL_TABLET | Freq: Every day | ORAL | Status: DC

## 2012-10-23 NOTE — Patient Instructions (Addendum)
Decrease Lisinopril HCTZ to 20/12.5mg  daily.  Start Isosorbide MN 30mg  daily.  New Rx sent to your pharmacy for NTG, Isosorbide and Lisinopril HCTZ.  Your physician recommends that you schedule a follow-up appointment in: One Month.

## 2012-10-30 NOTE — Assessment & Plan Note (Signed)
He has had infrequent episodes of chest discomfort that appeared to respond to nitrate therapy. These commonly occur early in the morning when he first wakes up. If they happen more frequently we will prescribe the isosorbide mononitrate on a daily basis, otherwise he is better off taking sublingual nitroglycerin on an occasional basis. Overall the burden of angina does not appear to be significantly changed. His last cardiac catheterization was performed less than 2 years ago and did not show any opportunities for further revascularization. If the wording of angina increases will perform a nuclear stress test.

## 2012-10-30 NOTE — Assessment & Plan Note (Signed)
Left ventricular ejection fraction estimated to be 45-50%, currently without any signs or symptoms of congestive heart failure, functional class I, euvolemic

## 2012-10-30 NOTE — Assessment & Plan Note (Signed)
Intermittent. He has 99% atrial pacing and rare ventricular pacing.

## 2012-10-30 NOTE — Assessment & Plan Note (Signed)
Normal pacemaker check on September 2 of this year

## 2012-10-30 NOTE — Assessment & Plan Note (Signed)
He continues to have occasional lengthy episodes of asymptomatic nonsustained ventricular tachycardia. By his last device check, the overall prevalence does not appear to be significantly changed.

## 2012-10-30 NOTE — Progress Notes (Signed)
Patient ID: Alan [name], male   DOB: 10-23-1940, 72 y.o.   MRN: [id number]     Reason for office visit Followup ischemic cardiomyopathy, atrial and ventricular arrhythmia, symptomatic orthostatic hypotension.  Alan [name] feels better since we reduced the doses of his antihypertensive medications. He still has occasional episodes of weakness and dizziness associated with systolic blood pressures less than 100 mm Hg. Overall he has had more "good days". He brings a very detailed record of his blood pressure checked sometimes 3 or 4 times a day but at least twice daily. Indeed his symptoms seem to correlate with low blood pressure. His systolic blood pressure is usually in the 110-125 range. His diastolic blood pressure is often in the low 50s. He has had rare episodes of chest discomfort relieved by nitroglycerin that have occurred both at rest and exertion. The pattern does not appear to be change from previous reports.    No Known Allergies  Current Outpatient Prescriptions  Medication Sig Dispense Refill  . aspirin EC 81 MG tablet Take 81 mg by mouth daily.      . carvedilol (COREG) 25 MG tablet 25 mg 2 (two) times daily with a meal.       . Chromium-Cinnamon (CINNAMON PLUS CHROMIUM PO) Take 1 tablet by mouth at bedtime.       . fish oil-omega-3 fatty acids 1000 MG capsule Take 1 g by mouth every morning.      . Flaxseed, Linseed, 1300 MG CAPS Take 1,300 mg by mouth daily.      . furosemide (LASIX) 40 MG tablet Take 0.5 tablets (20 mg total) by mouth daily.  90 tablet  3  . glipiZIDE (GLUCOTROL) 10 MG tablet Take 10 mg by mouth daily as needed (for high blood surgars). For high blood sugars      . insulin glargine (LANTUS) 100 UNIT/ML injection Inject 20 Units into the skin daily as needed (elevated blood sugars).       . metFORMIN (GLUCOPHAGE) 500 MG tablet Take 500 mg by mouth 4 (four) times daily.      . Multiple Vitamin (MULTIVITAMIN WITH MINERALS) TABS Take 1 tablet by mouth every  morning.      . nitroGLYCERIN (NITROSTAT) 0.4 MG SL tablet Place 1 tablet (0.4 mg total) under the tongue every 5 (five) minutes as needed. For chest pain  25 tablet  11  . warfarin (COUMADIN) 5 MG tablet Take 5-7.5 mg by mouth daily. Tues & Thurs-1.5 tab (to total 7.5 mg); Allo other days  1 tab (to total 5 mg)      . amLODipine (NORVASC) 10 MG tablet Take 1 tablet (10 mg total) by mouth 2 (two) times daily. 1/4 tab by mouth twice daily  90 tablet  3  . isosorbide mononitrate (IMDUR) 30 MG 24 hr tablet Take 1 tablet (30 mg total) by mouth daily.  90 tablet  3  . lisinopril-hydrochlorothiazide (PRINZIDE,ZESTORETIC) 20-12.5 MG per tablet Take 1 tablet by mouth daily.  90 tablet  3   No current facility-administered medications for this visit.    Past Medical History  Diagnosis Date  . Diabetes mellitus   . A-fib   . Complete heart block   . Coronary artery disease     seen at [facility] cardiology   . Hypertension     Dr. Veronia Beets, primary  . Sleep apnea     cpap, 11, last sleep study Jan2013, sees Dr. Keturah Barre  . Neuromuscular disorder  carpal tunnel bilaterally  . Polio     hx of  . Chronic back pain   . Pacemaker 03/07/2012    replaced  . Status post placement of cardiac pacemaker, 03/07/12 New Medtronic Gen and new Vent lead original placed 2008 03/08/2012  . Anticoagulant long-term use, on coumadin for PAF 03/08/2012  . Hx of echocardiogram 06/29/2008    EF 45-50%     Past Surgical History  Procedure Laterality Date  . Coronary artery bypass graft  1990  . Stents    . Coronary artery bypass graft  2004  . Maze procedure  2004  . Pacemaker insertion  08, 1/14  . Back surgery    . Cardiac catheterization    . Tonsillectomy    . Appendectomy    . Hernia repair    . Lumbar laminectomy/decompression microdiscectomy  01/10/2012    Procedure: LUMBAR LAMINECTOMY/DECOMPRESSION MICRODISCECTOMY 1 LEVEL;  Surgeon: [name], MD;  Location: [facility];  Service:  Neurosurgery;  Laterality: Bilateral;  Lumbar three-four decompression, Posterior lateral fusion lumbar three-four, posterior spinus plate lumbar three-four  . Insert / replace / remove pacemaker  03/07/2012    replaced  . Joint replacement      Hx: of left knee  . Fracture surgery      Hx: of right leg  . Nasal septum surgery N/A 14  . Posterior fusion lumbar spine  08/28/2012    Dr Ronnald Ramp    Family History  Problem Relation Age of Onset  . Other Father     MVA  . Cancer Mother     History   Social History  . Marital Status: Single    Spouse Name: N/A    Number of Children: N/A  . Years of Education: N/A   Occupational History  . pilot-retired    Social History Main Topics  . Smoking status: Never Smoker   . Smokeless tobacco: Never Used  . Alcohol Use: No  . Drug Use: No  . Sexual Activity: Yes   Other Topics Concern  . Not on file   Social History Narrative   [place] [facility], fishes.     Review of systems: Rare episodes of chest discomfort relieved by nitrates. Occasional orthostatic dizziness. The patient specifically denies dyspnea at rest or with exertion, orthopnea, paroxysmal nocturnal dyspnea, syncope, palpitations, focal neurological deficits, intermittent claudication, lower extremity edema, unexplained weight gain, cough, hemoptysis or wheezing.  The patient also denies abdominal pain, nausea, vomiting, dysphagia, diarrhea, constipation, polyuria, polydipsia, dysuria, hematuria, frequency, urgency, abnormal bleeding or bruising, fever, chills, unexpected weight changes, mood swings, change in skin or hair texture, change in voice quality, auditory or visual problems, allergic reactions or rashes, new musculoskeletal complaints other than usual "aches and pains".   PHYSICAL EXAM BP 138/72  Pulse 65  Ht 5\' 10"  (1.778 m)  Wt 218 lb 8 oz (99.111 kg)  BMI 31.35 kg/m2 General: Alert, oriented x3, no distress, mildly obese  Head: no  evidence of trauma, PERRL, EOMI, no exophtalmos or lid lag, no myxedema, no xanthelasma; normal ears, nose and oropharynx  Neck: normal jugular venous pulsations and no hepatojugular reflux; brisk carotid pulses without delay and no carotid bruits  Chest: clear to auscultation, no signs of consolidation by percussion or palpation, normal fremitus, symmetrical and full respiratory excursions  Cardiovascular: normal position and quality of the apical impulse, regular rhythm, normal first and second heart sounds, no murmurs, rubs or gallops  Abdomen:  no tenderness or distention, no masses by palpation, no abnormal pulsatility or arterial bruits, normal bowel sounds, no hepatosplenomegaly  Extremities: no clubbing, cyanosis or edema; 2+ radial, ulnar and brachial pulses bilaterally; 2+ right femoral, posterior tibial and dorsalis pedis pulses; 2+ left femoral, posterior tibial and dorsalis pedis pulses; no subclavian or femoral bruits  Neurological: grossly nonfocal except for 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 Mild first degree block. Minor intraventricular conduction delay.  Lipid Panel     Component Value Date/Time   CHOL 134 12/20/2010 0903   TRIG 104 12/20/2010 0903   HDL 38* 12/20/2010 0903   CHOLHDL 3.5 12/20/2010 0903   VLDL 21 12/20/2010 0903   LDLCALC 75 12/20/2010 0903    BMET    Component Value Date/Time   NA 137 08/22/2012 1029   K 4.0 08/22/2012 1029   CL 99 08/22/2012 1029   CO2 27 08/22/2012 1029   GLUCOSE 138* 08/22/2012 1029   BUN 28* 08/22/2012 1029   CREATININE 1.33 08/22/2012 1029   CALCIUM 10.2 08/22/2012 1029   GFRNONAA 53* 08/22/2012 1029   GFRAA 61* 08/22/2012 1029     ASSESSMENT AND PLAN CORONARY ARTERY BYPASS GRAFT, HX OF, in 1990 and Re-do CABG 2004 with Atrial Maze procedure,  He has had infrequent episodes of chest discomfort that appeared to  respond to nitrate therapy. These commonly occur early in the morning when he first wakes up. If they happen more frequently we will prescribe the isosorbide mononitrate on a daily basis, otherwise he is better off taking sublingual nitroglycerin on an occasional basis. Overall the burden of angina does not appear to be significantly changed. His last cardiac catheterization was performed less than 2 years ago and did not show any opportunities for further revascularization. If the wording of angina increases will perform a nuclear stress test.  CHB (complete heart block) Intermittent. He has 99% atrial pacing and rare ventricular pacing.  ATRIAL FIBRILLATION, PAROXYSMAL At his last pacemaker check the overall burden of "mode switch "episodes was only 0.1%. He is on warfarin anticoagulation. High ventricular rates do not occur during atrial fibrillation.  Non-sustained ventricular tachycardia He continues to have occasional lengthy episodes of asymptomatic nonsustained ventricular tachycardia. By his last device check, the overall prevalence does not appear to be significantly changed.  Ischemic cardiomyopathy Left ventricular ejection fraction estimated to be 45-50%, currently without any signs or symptoms of congestive heart failure, functional class I, euvolemic  Status post placement of cardiac pacemaker, 03/07/12 New Medtronic Gen and new Vent lead due to ERI and poorly functioning lead original placed 2008 Normal pacemaker check on September 2 of this year  Orders Placed This Encounter  Procedures  . EKG 12-Lead   Meds ordered this encounter  Medications  . lisinopril-hydrochlorothiazide (PRINZIDE,ZESTORETIC) 20-12.5 MG per tablet    Sig: Take 1 tablet by mouth daily.    Dispense:  90 tablet    Refill:  3  . nitroGLYCERIN (NITROSTAT) 0.4 MG SL tablet    Sig: Place 1 tablet (0.4 mg total) under the tongue every 5 (five) minutes as needed. For chest pain    Dispense:  25 tablet     Refill:  11  . isosorbide mononitrate (IMDUR) 30 MG 24 hr tablet    Sig: Take 1 tablet (30 mg total) by mouth daily.    Dispense:  90 tablet  Refill:  [place] Vennie Waymire, MD, [place] [phone] office [phone] pager

## 2012-10-30 NOTE — Assessment & Plan Note (Signed)
At his last pacemaker check the overall burden of "mode switch "episodes was only 0.1%. He is on warfarin anticoagulation. High ventricular rates do not occur during atrial fibrillation.

## 2012-10-31 ENCOUNTER — Encounter: Payer: Self-pay | Admitting: Cardiovascular Disease

## 2012-11-15 ENCOUNTER — Ambulatory Visit (INDEPENDENT_AMBULATORY_CARE_PROVIDER_SITE_OTHER): Payer: Medicare Other | Admitting: Internal Medicine

## 2012-11-15 ENCOUNTER — Encounter: Payer: Self-pay | Admitting: Internal Medicine

## 2012-11-15 VITALS — BP 138/60 | HR 64 | Ht 70.0 in | Wt 229.4 lb

## 2012-11-15 DIAGNOSIS — Z23 Encounter for immunization: Secondary | ICD-10-CM

## 2012-11-15 DIAGNOSIS — G4733 Obstructive sleep apnea (adult) (pediatric): Secondary | ICD-10-CM

## 2012-11-15 NOTE — Patient Instructions (Signed)
Order- DME Advanced- increase CPAP to 12   Dx OSA  Hi dose Flu vax

## 2012-11-15 NOTE — Progress Notes (Signed)
03/02/11 70 yoM never smoker followed for OSA, history UPPP. Complicated by Post polio, AFib/ pacemaker   PCP Dr Melford Aase LOV-Jan 28, 2010 He has continued using CPAP 11/Advanced all night every night. He denies snoring or daytime tiredness. He likes his nasal pillows mask.  10/03/2012 Acute OV  Complains DOE, hoarseness, dry cough since back surgery 4weeks ago.  has been on cefdinir and augmentin (finished this 8/25). Seen by PCP x 2 with neg cxr per pt. No records available (records requested) .  No fever, discolored mucus, chest pain , calf pain or swelling.  Cough is driving him crazy, dry cough all the time.  Is on an ace inhibitor, lisinopril 20mg  (1/4 tab ) with low /nml b/p.  No weight loss, hemoptysis.   11/15/12- 19 yoM never smoker followed for OSA, history UPPP. Complicated by Post polio, AFib/ pacemaker   PCP Dr Melford Aase Follow up.  Wearing CPAP 11/Advanced everynight for approx 8-9 hours.  No problems with mask or pressure.  Cough has resolved.   Discussed flu shot Feels CPAP might need to be higher. Has had cough since intubation for back surgery.  ROS-HPI Constitutional:   No-   weight loss, night sweats, fevers, chills, fatigue, lassitude. HEENT:   No-  headaches, difficulty swallowing, tooth/dental problems, sore throat,       No-  sneezing, itching, ear ache, nasal congestion, post nasal drip,  CV:  No-   chest pain, orthopnea, PND, swelling in lower extremities, anasarca, dizziness, palpitations Resp: No-   shortness of breath with exertion or at rest.  + nonproductive cough            No-   No- coughing up of blood.              No-   change in color of mucus.  No- wheezing.   Skin: No-   rash or lesions. GI:  No-   heartburn, indigestion, abdominal pain, nausea, vomiting,  GU:  MS:  No-   joint pain or swelling.   Neuro-     nothing unusual Psych:  No- change in mood or affect. No depression or anxiety.  No memory loss.  OBJ General- Alert, Oriented, Affect-appropriate,  Distress- none acute, overweight Skin- rash-none, lesions- none, excoriation- none Lymphadenopathy- none Head- atraumatic            Eyes- Gross vision intact, PERRLA, conjunctivae clear secretions            Ears- Hearing, canals-normal            Nose- Clear, no-Septal dev, mucus, polyps, erosion, perforation             Throat- Mallampati III/ +sp UPPP , mucosa clear , drainage- none, tonsils- atrophic Neck- flexible , trachea midline, no stridor , thyroid nl, carotid no bruit Chest - symmetrical excursion , unlabored           Heart/CV- RRR to palpation , no murmur , no gallop  , no rub, nl s1 s2                           - JVD- none , edema- none, stasis changes- none, varices- none           Lung- clear to P&A, wheeze- none, cough- none , dullness-none, rub- none           Chest wall-  Abd- Br/ Gen/ Rectal- Not done, not indicated Extrem- cyanosis- none,  clubbing, none, atrophy- none, strength- nl Neuro- grossly intact to observation

## 2012-11-25 ENCOUNTER — Encounter: Payer: Self-pay | Admitting: Cardiovascular Disease

## 2012-11-25 ENCOUNTER — Ambulatory Visit (INDEPENDENT_AMBULATORY_CARE_PROVIDER_SITE_OTHER): Payer: Medicare Other | Admitting: Cardiovascular Disease

## 2012-11-25 VITALS — BP 138/70 | HR 74 | Resp 16 | Ht 70.0 in | Wt 224.3 lb

## 2012-11-25 DIAGNOSIS — I472 Ventricular tachycardia, unspecified: Secondary | ICD-10-CM

## 2012-11-25 DIAGNOSIS — E785 Hyperlipidemia, unspecified: Secondary | ICD-10-CM

## 2012-11-25 DIAGNOSIS — I4729 Other ventricular tachycardia: Secondary | ICD-10-CM

## 2012-11-25 DIAGNOSIS — I2589 Other forms of chronic ischemic heart disease: Secondary | ICD-10-CM

## 2012-11-25 DIAGNOSIS — I2510 Atherosclerotic heart disease of native coronary artery without angina pectoris: Secondary | ICD-10-CM

## 2012-11-25 DIAGNOSIS — Z950 Presence of cardiac pacemaker: Secondary | ICD-10-CM

## 2012-11-25 DIAGNOSIS — I10 Essential (primary) hypertension: Secondary | ICD-10-CM

## 2012-11-25 DIAGNOSIS — I255 Ischemic cardiomyopathy: Secondary | ICD-10-CM

## 2012-11-25 NOTE — Patient Instructions (Addendum)
Remote monitoring is used to monitor your pacemaker from home. This monitoring reduces the number of office visits required to check your device to one time per year. It allows [place] to keep an eye on the functioning of your device to ensure it is working properly. You are scheduled for a device check from home on 01-08-2013. You may send your transmission at any time that day. If you have a wireless device, the transmission will be sent automatically. After your physician reviews your transmission, you will receive a postcard with your next transmission date.  Your physician recommends that you schedule a follow-up appointment in:  6 Months.

## 2012-11-25 NOTE — Assessment & Plan Note (Signed)
Coronary bypass surgery 1990, redo bypass surgery 2004,  Cardiac catheterization November 2012 shows occluded LAD artery with patent sequential LIMA bypass to the first diagonal and LAD artery, 50% stenosis in major oblique marginal (OM 2), totally occluded proximal right coronary artery with patent saphenous vein graft to the posterior descending artery, but with subtotal occlusion of the posterior descending artery downstream of anastomosis.  Angina free.

## 2012-11-25 NOTE — Assessment & Plan Note (Signed)
Asymptomatic events recorded by his pacemaker

## 2012-11-25 NOTE — Progress Notes (Signed)
Patient ID: Alan [name], male   DOB: January 28, 1941, 72 y.o.   MRN: [id number]      Reason for office visit Hypertension, CAD, ischemic cardiomyopathy, pacemaker  But she feels better. After we reduced the dose of his diuretic he stopped having dizzy spells and weak spells. However he noticed that his blood pressure was inching up. He restarted taking amlodipine 5 mg once daily and brings as always a very detailed record of his blood pressure readings. For the most part his blood pressure control has been good. We went over every one of his cardiac medications explaining the fact that many of these medicines have multiple indications, especially his ACE inhibitor and beta blocker. We also reviewed the importance of lifestyle changes such as  sodium restriction and regular exercise and weight loss. The degree of glycemic control will have an impact on his blood pressure, possibly causing orthostatic dizziness if he has prolonged hyperglycemia and glucosuria.   No Known Allergies  Current Outpatient Prescriptions  Medication Sig Dispense Refill  . amLODipine (NORVASC) 10 MG tablet Take 5 mg by mouth daily.       [name] Kitchen aspirin EC 81 MG tablet Take 81 mg by mouth daily.      . carvedilol (COREG) 25 MG tablet Take 25 mg by mouth 2 (two) times daily with a meal.       . Chromium-Cinnamon (CINNAMON PLUS CHROMIUM PO) Take 1 tablet by mouth at bedtime.       . fish oil-omega-3 fatty acids 1000 MG capsule Take 1 g by mouth every morning.      . Flaxseed, Linseed, 1300 MG CAPS Take 1,300 mg by mouth daily.      . furosemide (LASIX) 40 MG tablet Take 0.5 tablets (20 mg total) by mouth daily.  90 tablet  3  . glipiZIDE (GLUCOTROL) 10 MG tablet Take 10 mg by mouth daily as needed (for high blood surgars). For high blood sugars      . insulin glargine (LANTUS) 100 UNIT/ML injection Inject 20 Units into the skin daily as needed (elevated blood sugars).       . isosorbide mononitrate (IMDUR) 30 MG 24 hr tablet Take  30 mg by mouth daily as needed.      [name] Kitchen lisinopril-hydrochlorothiazide (PRINZIDE,ZESTORETIC) 20-12.5 MG per tablet Take 1 tablet by mouth daily.  90 tablet  3  . metFORMIN (GLUCOPHAGE) 500 MG tablet Take 500 mg by mouth 4 (four) times daily.      . Multiple Vitamin (MULTIVITAMIN WITH MINERALS) TABS Take 1 tablet by mouth every morning.      . nitroGLYCERIN (NITROSTAT) 0.4 MG SL tablet Place 1 tablet (0.4 mg total) under the tongue every 5 (five) minutes as needed. For chest pain  25 tablet  11  . warfarin (COUMADIN) 5 MG tablet Take 5-7.5 mg by mouth daily. Tues & Thurs-1.5 tab (to total 7.5 mg); Allo other days  1 tab (to total 5 mg)       No current facility-administered medications for this visit.    Past Medical History  Diagnosis Date  . Diabetes mellitus   . A-fib   . Complete heart block   . Coronary artery disease     seen at [facility] cardiology   . Hypertension     Dr. Veronia Beets, primary  . Sleep apnea     cpap, 11, last sleep study Jan2013, sees Dr. Keturah Barre  . Neuromuscular disorder     carpal tunnel bilaterally  .  Polio     hx of  . Chronic back pain   . Pacemaker 03/07/2012    replaced  . Status post placement of cardiac pacemaker, 03/07/12 New Medtronic Gen and new Vent lead original placed 2008 03/08/2012  . Anticoagulant long-term use, on coumadin for PAF 03/08/2012  . Hx of echocardiogram 06/29/2008    EF 45-50%     Past Surgical History  Procedure Laterality Date  . Coronary artery bypass graft  1990  . Stents    . Coronary artery bypass graft  2004  . Maze procedure  2004  . Pacemaker insertion  08, 1/14  . Back surgery    . Cardiac catheterization    . Tonsillectomy    . Appendectomy    . Hernia repair    . Lumbar laminectomy/decompression microdiscectomy  01/10/2012    Procedure: LUMBAR LAMINECTOMY/DECOMPRESSION MICRODISCECTOMY 1 LEVEL;  Surgeon: [name], MD;  Location: [place] [facility];  Service: Neurosurgery;  Laterality: Bilateral;  Lumbar  three-four decompression, Posterior lateral fusion lumbar three-four, posterior spinus plate lumbar three-four  . Insert / replace / remove pacemaker  03/07/2012    replaced  . Joint replacement      Hx: of left knee  . Fracture surgery      Hx: of right leg  . Nasal septum surgery N/A 14  . Posterior fusion lumbar spine  08/28/2012    Dr Ronnald Ramp    Family History  Problem Relation Age of Onset  . Other Father     MVA  . Cancer Mother     History   Social History  . Marital Status: Single    Spouse Name: N/A    Number of Children: N/A  . Years of Education: N/A   Occupational History  . pilot-retired    Social History Main Topics  . Smoking status: Never Smoker   . Smokeless tobacco: Never Used  . Alcohol Use: No  . Drug Use: No  . Sexual Activity: Yes   Other Topics Concern  . Not on file   Social History Narrative   [place] [address] [place] [facility], fishes.     Review of systems: The patient specifically denies any chest pain at rest or with exertion, dyspnea at rest or with exertion, orthopnea, paroxysmal nocturnal dyspnea, syncope, palpitations, focal neurological deficits, intermittent claudication, lower extremity edema, unexplained weight gain, cough, hemoptysis or wheezing.  The patient also denies abdominal pain, nausea, vomiting, dysphagia, diarrhea, constipation, polyuria, polydipsia, dysuria, hematuria, frequency, urgency, abnormal bleeding or bruising, fever, chills, unexpected weight changes, mood swings, change in skin or hair texture, change in voice quality, auditory or visual problems, allergic reactions or rashes, new musculoskeletal complaints other than usual "aches and pains".   PHYSICAL EXAM BP 138/70  Pulse 74  Resp 16  Ht 5\' 10"  (1.778 m)  Wt 224 lb 4.8 oz (101.742 kg)  BMI 32.18 kg/m2  General: Alert, oriented x3, no distress Head: no evidence of trauma, PERRL, EOMI, no exophtalmos or lid lag, no myxedema, no xanthelasma; normal  ears, nose and oropharynx Neck: normal jugular venous pulsations and no hepatojugular reflux; brisk carotid pulses without delay and no carotid bruits Chest: clear to auscultation, no signs of consolidation by percussion or palpation, normal fremitus, symmetrical and full respiratory excursions Cardiovascular: normal position and quality of the apical impulse, regular rhythm, normal first and second heart sounds, no murmurs, rubs or gallops Abdomen: no tenderness or distention, no masses by palpation, no abnormal pulsatility  or arterial bruits, normal bowel sounds, no hepatosplenomegaly Extremities: no clubbing, cyanosis or edema; 2+ radial, ulnar and brachial pulses bilaterally; 2+ right femoral, posterior tibial and dorsalis pedis pulses; 2+ left femoral, posterior tibial and dorsalis pedis pulses; no subclavian or femoral bruits Neurological: grossly nonfocal   Lipid Panel     Component Value Date/Time   CHOL 134 12/20/2010 0903   TRIG 104 12/20/2010 0903   HDL 38* 12/20/2010 0903   CHOLHDL 3.5 12/20/2010 0903   VLDL 21 12/20/2010 0903   LDLCALC 75 12/20/2010 0903    BMET    Component Value Date/Time   NA 137 08/22/2012 1029   K 4.0 08/22/2012 1029   CL 99 08/22/2012 1029   CO2 27 08/22/2012 1029   GLUCOSE 138* 08/22/2012 1029   BUN 28* 08/22/2012 1029   CREATININE 1.33 08/22/2012 1029   CALCIUM 10.2 08/22/2012 1029   GFRNONAA 53* 08/22/2012 1029   GFRAA 61* 08/22/2012 1029     ASSESSMENT AND PLAN Hypertension It seems that he no longer has orthostatic hypotension. These episodes seemed to improve after reducing the dose of thiazide diuretic. The addition of amlodipine has now pretty much taking care of episodic hypertension and he seems to feel well. His typical blood pressure now is in the 1 teens to 120s over 60s. He rarely has a diastolic blood pressure less than 60 mm Hg. He occasionally has a systolic blood pressure greater than 140. No additional changes to his antihypertensive  medications is planned at this time. I reinforced the fact that many of his medications do more than just control his blood pressure. It is particularly important that he remain on maximum tolerated doses of ACE inhibitors and beta blockers.  Pacemaker - Medtronic Adapta dual chamber He had a generator change out and a new ventricular lead placed earlier this year, had a comprehensive pacemaker check in September that was normal. He has 100% atrial pacing and very rare ventricular pacing. Although he is not technically pacemaker dependent his sinus rhythm is very slow at 30 beats per minute. He has a history of paroxysmal complete heart block.  Non-sustained ventricular tachycardia Asymptomatic events recorded by his pacemaker  Ischemic cardiomyopathy Currently well compensated, NYHA functional class 1-2, euvolemic.  CAD Coronary bypass surgery 1990, redo bypass surgery 2004,  Cardiac catheterization November 2012 shows occluded LAD artery with patent sequential LIMA bypass to the first diagonal and LAD artery, 50% stenosis in major oblique marginal (OM 2), totally occluded proximal right coronary artery with patent saphenous vein graft to the posterior descending artery, but with subtotal occlusion of the posterior descending artery downstream of anastomosis.  Angina free.  Dyslipidemia Note that he is intolerant to multiple statins. Last lipid profile was favorable except for a low HDL cholesterol. He is due to have a "complete physical" including lab work with Dr. Melford Aase later this month.  No orders of the defined types were placed in this encounter.   Meds ordered this encounter  Medications  . amLODipine (NORVASC) 10 MG tablet    Sig: Take 5 mg by mouth daily.     Holli Humbles, MD, [place] [phone] office [phone] pager

## 2012-11-25 NOTE — Assessment & Plan Note (Signed)
He had a generator change out and a new ventricular lead placed earlier this year, had a comprehensive pacemaker check in September that was normal. He has 100% atrial pacing and very rare ventricular pacing. Although he is not technically pacemaker dependent his sinus rhythm is very slow at 30 beats per minute. He has a history of paroxysmal complete heart block.

## 2012-11-25 NOTE — Assessment & Plan Note (Signed)
It seems that he no longer has orthostatic hypotension. These episodes seemed to improve after reducing the dose of thiazide diuretic. The addition of amlodipine has now pretty much taking care of episodic hypertension and he seems to feel well. His typical blood pressure now is in the 1 teens to 120s over 60s. He rarely has a diastolic blood pressure less than 60 mm Hg. He occasionally has a systolic blood pressure greater than 140. No additional changes to his antihypertensive medications is planned at this time. I reinforced the fact that many of his medications do more than just control his blood pressure. It is particularly important that he remain on maximum tolerated doses of ACE inhibitors and beta blockers.

## 2012-11-25 NOTE — Assessment & Plan Note (Signed)
Currently well compensated, NYHA functional class 1-2, euvolemic.

## 2012-11-25 NOTE — Assessment & Plan Note (Addendum)
Note that he is intolerant to multiple statins. Last lipid profile was favorable except for a low HDL cholesterol. He is due to have a "complete physical" including lab work with Dr. Melford Aase later this month.

## 2012-12-01 NOTE — Assessment & Plan Note (Signed)
He would like to try CPAP pressure increase-discussed. Weight loss recommended  Plan-DME E[facility] increase CPAP to 12

## 2012-12-23 ENCOUNTER — Telehealth: Payer: Self-pay | Admitting: *Deleted

## 2012-12-23 MED ORDER — AMLODIPINE BESYLATE 10 MG PO TABS: 5.0000 mg | ORAL_TABLET | Freq: Two times a day (BID) | ORAL | Status: DC

## 2012-12-23 NOTE — Telephone Encounter (Signed)
^^  Walk-In Message^^  "Pt needs 90 day supply of amlodipine besylate 10 mg.  Pt has called & left message however, no return call or no Rx @ pharmacy.  [place]."  Spoke w/ pt and informed refill request was not received, but Rx will be sent to pharmacy.  Pt verbalized understanding and agreed w/ plan.  Refill(s) sent to pharmacy.

## 2013-02-27 ENCOUNTER — Ambulatory Visit: Payer: Self-pay | Admitting: Pharmacist Clinician (PhC)/ Clinical Pharmacy Specialist

## 2013-02-27 DIAGNOSIS — Z7901 Long term (current) use of anticoagulants: Secondary | ICD-10-CM

## 2013-02-27 DIAGNOSIS — I4891 Unspecified atrial fibrillation: Secondary | ICD-10-CM

## 2013-03-17 ENCOUNTER — Encounter (HOSPITAL_COMMUNITY): Payer: Self-pay | Admitting: Emergency Medicine

## 2013-03-17 ENCOUNTER — Inpatient Hospital Stay (HOSPITAL_COMMUNITY)
Admission: EM | Admit: 2013-03-17 | Discharge: 2013-03-19 | DRG: 378 | Disposition: A | Discharge status: Home / Self Care | Payer: Medicare Other | Attending: Internal Medicine | Admitting: Internal Medicine

## 2013-03-17 DIAGNOSIS — Z7901 Long term (current) use of anticoagulants: Secondary | ICD-10-CM

## 2013-03-17 DIAGNOSIS — E785 Hyperlipidemia, unspecified: Secondary | ICD-10-CM | POA: Diagnosis present

## 2013-03-17 DIAGNOSIS — Z7982 Long term (current) use of aspirin: Secondary | ICD-10-CM

## 2013-03-17 DIAGNOSIS — Z794 Long term (current) use of insulin: Secondary | ICD-10-CM

## 2013-03-17 DIAGNOSIS — I255 Ischemic cardiomyopathy: Secondary | ICD-10-CM | POA: Diagnosis present

## 2013-03-17 DIAGNOSIS — D62 Acute posthemorrhagic anemia: Secondary | ICD-10-CM | POA: Diagnosis present

## 2013-03-17 DIAGNOSIS — T45515A Adverse effect of anticoagulants, initial encounter: Secondary | ICD-10-CM | POA: Diagnosis present

## 2013-03-17 DIAGNOSIS — Z981 Arthrodesis status: Secondary | ICD-10-CM

## 2013-03-17 DIAGNOSIS — R791 Abnormal coagulation profile: Secondary | ICD-10-CM

## 2013-03-17 DIAGNOSIS — M549 Dorsalgia, unspecified: Secondary | ICD-10-CM | POA: Diagnosis present

## 2013-03-17 DIAGNOSIS — Z951 Presence of aortocoronary bypass graft: Secondary | ICD-10-CM

## 2013-03-17 DIAGNOSIS — G4733 Obstructive sleep apnea (adult) (pediatric): Secondary | ICD-10-CM | POA: Diagnosis present

## 2013-03-17 DIAGNOSIS — K922 Gastrointestinal hemorrhage, unspecified: Secondary | ICD-10-CM | POA: Diagnosis present

## 2013-03-17 DIAGNOSIS — E1169 Type 2 diabetes mellitus with other specified complication: Secondary | ICD-10-CM | POA: Diagnosis present

## 2013-03-17 DIAGNOSIS — K449 Diaphragmatic hernia without obstruction or gangrene: Secondary | ICD-10-CM | POA: Diagnosis present

## 2013-03-17 DIAGNOSIS — I2589 Other forms of chronic ischemic heart disease: Secondary | ICD-10-CM | POA: Diagnosis present

## 2013-03-17 DIAGNOSIS — I10 Essential (primary) hypertension: Secondary | ICD-10-CM

## 2013-03-17 DIAGNOSIS — E119 Type 2 diabetes mellitus without complications: Secondary | ICD-10-CM | POA: Diagnosis present

## 2013-03-17 DIAGNOSIS — K921 Melena: Secondary | ICD-10-CM

## 2013-03-17 DIAGNOSIS — Z96659 Presence of unspecified artificial knee joint: Secondary | ICD-10-CM

## 2013-03-17 DIAGNOSIS — N184 Chronic kidney disease, stage 4 (severe): Secondary | ICD-10-CM | POA: Diagnosis present

## 2013-03-17 DIAGNOSIS — E1122 Type 2 diabetes mellitus with diabetic chronic kidney disease: Secondary | ICD-10-CM | POA: Diagnosis present

## 2013-03-17 DIAGNOSIS — Z950 Presence of cardiac pacemaker: Secondary | ICD-10-CM

## 2013-03-17 DIAGNOSIS — I4891 Unspecified atrial fibrillation: Secondary | ICD-10-CM | POA: Diagnosis present

## 2013-03-17 DIAGNOSIS — I4819 Other persistent atrial fibrillation: Secondary | ICD-10-CM | POA: Diagnosis present

## 2013-03-17 DIAGNOSIS — I2510 Atherosclerotic heart disease of native coronary artery without angina pectoris: Secondary | ICD-10-CM | POA: Diagnosis present

## 2013-03-17 DIAGNOSIS — B91 Sequelae of poliomyelitis: Secondary | ICD-10-CM

## 2013-03-17 DIAGNOSIS — I442 Atrioventricular block, complete: Secondary | ICD-10-CM

## 2013-03-17 DIAGNOSIS — I25718 Atherosclerosis of autologous vein coronary artery bypass graft(s) with other forms of angina pectoris: Secondary | ICD-10-CM

## 2013-03-17 DIAGNOSIS — G8929 Other chronic pain: Secondary | ICD-10-CM | POA: Diagnosis present

## 2013-03-17 DIAGNOSIS — E669 Obesity, unspecified: Secondary | ICD-10-CM

## 2013-03-17 DIAGNOSIS — Z79899 Other long term (current) drug therapy: Secondary | ICD-10-CM

## 2013-03-17 LAB — URINALYSIS, ROUTINE W REFLEX MICROSCOPIC
BILIRUBIN URINE: NEGATIVE
GLUCOSE, UA: NEGATIVE mg/dL
Hgb urine dipstick: NEGATIVE
KETONES UR: NEGATIVE mg/dL
Leukocytes, UA: NEGATIVE
Nitrite: NEGATIVE
PH: 5 (ref 5.0–8.0)
Protein, ur: NEGATIVE mg/dL
Specific Gravity, Urine: 1.016 (ref 1.005–1.030)
Urobilinogen, UA: 0.2 mg/dL (ref 0.0–1.0)

## 2013-03-17 LAB — CBC WITH DIFFERENTIAL/PLATELET
BASOS PCT: 1 % (ref 0–1)
Basophils Absolute: 0 10*3/uL (ref 0.0–0.1)
EOS ABS: 0.2 10*3/uL (ref 0.0–0.7)
Eosinophils Relative: 3 % (ref 0–5)
HEMATOCRIT: 24.8 % — AB (ref 39.0–52.0)
Hemoglobin: 8.4 g/dL — ABNORMAL LOW (ref 13.0–17.0)
Lymphocytes Relative: 25 % (ref 12–46)
Lymphs Abs: 1.5 10*3/uL (ref 0.7–4.0)
MCH: 27.9 pg (ref 26.0–34.0)
MCHC: 33.9 g/dL (ref 30.0–36.0)
MCV: 82.4 fL (ref 78.0–100.0)
MONO ABS: 0.5 10*3/uL (ref 0.1–1.0)
Monocytes Relative: 9 % (ref 3–12)
Neutro Abs: 3.7 10*3/uL (ref 1.7–7.7)
Neutrophils Relative %: 63 % (ref 43–77)
Platelets: 203 10*3/uL (ref 150–400)
RBC: 3.01 MIL/uL — ABNORMAL LOW (ref 4.22–5.81)
RDW: 18.6 % — ABNORMAL HIGH (ref 11.5–15.5)
WBC: 5.9 10*3/uL (ref 4.0–10.5)

## 2013-03-17 LAB — COMPREHENSIVE METABOLIC PANEL
ALT: 23 U/L (ref 0–53)
AST: 24 U/L (ref 0–37)
Albumin: 3.8 g/dL (ref 3.5–5.2)
Alkaline Phosphatase: 48 U/L (ref 39–117)
BUN: 54 mg/dL — ABNORMAL HIGH (ref 6–23)
CALCIUM: 9.2 mg/dL (ref 8.4–10.5)
CO2: 22 meq/L (ref 19–32)
Chloride: 97 mEq/L (ref 96–112)
Creatinine, Ser: 1.59 mg/dL — ABNORMAL HIGH (ref 0.50–1.35)
GFR calc Af Amer: 49 mL/min — ABNORMAL LOW (ref >90)
GFR, EST NON AFRICAN AMERICAN: 42 mL/min — AB (ref >90)
Glucose, Bld: 147 mg/dL — ABNORMAL HIGH (ref 70–99)
Potassium: 4.2 mEq/L (ref 3.7–5.3)
SODIUM: 134 meq/L — AB (ref 137–147)
TOTAL PROTEIN: 8 g/dL (ref 6.0–8.3)
Total Bilirubin: <0.2 mg/dL — ABNORMAL LOW (ref 0.3–1.2)

## 2013-03-17 LAB — PROTIME-INR
INR: 7.14 (ref 0.00–1.49)
Prothrombin Time: 58.3 seconds — ABNORMAL HIGH (ref 11.6–15.2)

## 2013-03-17 LAB — POCT I-STAT TROPONIN I: Troponin i, poc: 0.01 ng/mL (ref 0.00–0.08)

## 2013-03-17 LAB — GLUCOSE, CAPILLARY: Glucose-Capillary: 121 mg/dL — ABNORMAL HIGH (ref 70–99)

## 2013-03-17 LAB — APTT: aPTT: 57 seconds — ABNORMAL HIGH (ref 24–37)

## 2013-03-17 LAB — LIPASE, BLOOD: Lipase: 621 U/L — ABNORMAL HIGH (ref 11–59)

## 2013-03-17 LAB — OCCULT BLOOD, POC DEVICE: FECAL OCCULT BLD: POSITIVE — AB

## 2013-03-17 LAB — PREPARE RBC (CROSSMATCH)

## 2013-03-17 MED ORDER — INSULIN ASPART 100 UNIT/ML ~~LOC~~ SOLN
0.0000 [IU] | Freq: Three times a day (TID) | SUBCUTANEOUS | Status: DC
  Administered 2013-03-18 – 2013-03-19 (×4): 3 [IU] via SUBCUTANEOUS

## 2013-03-17 MED ORDER — NITROGLYCERIN 0.4 MG SL SUBL: 0.4000 mg | SUBLINGUAL_TABLET | SUBLINGUAL | Status: DC | PRN

## 2013-03-17 MED ORDER — CARVEDILOL 12.5 MG PO TABS
12.5000 mg | ORAL_TABLET | Freq: Two times a day (BID) | ORAL | Status: DC
  Administered 2013-03-17 – 2013-03-19 (×4): 12.5 mg via ORAL
  Filled 2013-03-17 (×6): qty 1

## 2013-03-17 MED ORDER — VITAMIN K1 10 MG/ML IJ SOLN
5.0000 mg | Freq: Once | INTRAVENOUS | Status: AC
  Administered 2013-03-17: 5 mg via INTRAVENOUS
  Filled 2013-03-17: qty 0.5

## 2013-03-17 MED ORDER — OMEGA-3 FATTY ACIDS 1000 MG PO CAPS: 1.0000 g | ORAL_CAPSULE | Freq: Two times a day (BID) | ORAL | Status: DC

## 2013-03-17 MED ORDER — SODIUM CHLORIDE 0.9 % IJ SOLN
3.0000 mL | Freq: Two times a day (BID) | INTRAMUSCULAR | Status: DC
  Administered 2013-03-17 – 2013-03-19 (×2): 3 mL via INTRAVENOUS

## 2013-03-17 MED ORDER — PANTOPRAZOLE SODIUM 40 MG IV SOLR
40.0000 mg | INTRAVENOUS | Status: AC
  Administered 2013-03-17: 40 mg via INTRAVENOUS
  Filled 2013-03-17: qty 40

## 2013-03-17 MED ORDER — ADULT MULTIVITAMIN W/MINERALS CH
1.0000 | ORAL_TABLET | Freq: Every morning | ORAL | Status: DC
  Administered 2013-03-18 – 2013-03-19 (×2): 1 via ORAL
  Filled 2013-03-17 (×2): qty 1

## 2013-03-17 MED ORDER — WARFARIN - PHARMACIST DOSING INPATIENT
Freq: Every day | Status: DC
  Administered 2013-03-18: 22:00:00

## 2013-03-17 MED ORDER — ONDANSETRON HCL 4 MG PO TABS: 4.0000 mg | ORAL_TABLET | Freq: Four times a day (QID) | ORAL | Status: DC | PRN

## 2013-03-17 MED ORDER — ONDANSETRON HCL 4 MG/2ML IJ SOLN
4.0000 mg | Freq: Four times a day (QID) | INTRAMUSCULAR | Status: DC | PRN
Start: 2013-03-17 — End: 2013-03-19

## 2013-03-17 MED ORDER — OMEGA-3-ACID ETHYL ESTERS 1 G PO CAPS
1.0000 g | ORAL_CAPSULE | Freq: Two times a day (BID) | ORAL | Status: DC
  Administered 2013-03-17 – 2013-03-19 (×4): 1 g via ORAL
  Filled 2013-03-17 (×5): qty 1

## 2013-03-17 MED ORDER — PANTOPRAZOLE SODIUM 40 MG IV SOLR
40.0000 mg | Freq: Two times a day (BID) | INTRAVENOUS | Status: DC
  Administered 2013-03-18 (×2): 40 mg via INTRAVENOUS
  Filled 2013-03-17 (×5): qty 40

## 2013-03-17 MED ORDER — SODIUM CHLORIDE 0.9 % IV SOLN
INTRAVENOUS | Status: DC
  Administered 2013-03-17: 1000 mL via INTRAVENOUS

## 2013-03-17 NOTE — ED Notes (Signed)
Pt has been having dark stools, INR 8.0 and is on coumadin and concerned for bleeding inside

## 2013-03-17 NOTE — Progress Notes (Signed)
ANTICOAGULATION CONSULT NOTE - Initial Consult  Pharmacy Consult for Coumadin Indication: atrial fibrillation  No Known Allergies  Patient Measurements: Height: 5\' 10"  (177.8 cm) Weight: 230 lb 9.6 oz (104.599 kg) IBW/kg (Calculated) : 73  Vital Signs: Temp: 98 F (36.7 C) (02/09 2200) Temp src: Oral (02/09 2200) BP: 127/51 mmHg (02/09 2200) Pulse Rate: 61 (02/09 2200)  Labs:  Recent Labs  03/17/13 1430 03/17/13 1453  HGB 8.4*  --   HCT 24.8*  --   PLT 203  --   APTT  --  57*  LABPROT 58.3*  --   INR 7.14*  --   CREATININE 1.59*  --     Estimated Creatinine Clearance: 51.6 ml/min (by C-G formula based on Cr of 1.59).   Medical History: Past Medical History  Diagnosis Date  . Diabetes mellitus   . A-fib   . Complete heart block   . Coronary artery disease     seen at [facility] cardiology   . Hypertension     Dr. Veronia Beets, primary  . Sleep apnea     cpap, 11, last sleep study Jan2013, sees Dr. Keturah Barre  . Neuromuscular disorder     carpal tunnel bilaterally  . Polio     hx of  . Chronic back pain   . Pacemaker 03/07/2012    replaced  . Status post placement of cardiac pacemaker, 03/07/12 New Medtronic Gen and new Vent lead original placed 2008 03/08/2012  . Anticoagulant long-term use, on coumadin for PAF 03/08/2012  . Hx of echocardiogram 06/29/2008    EF 45-50%     Medications:  Prescriptions prior to admission  Medication Sig Dispense Refill  . amLODipine (NORVASC) 10 MG tablet Take 0.5 tablets (5 mg total) by mouth 2 (two) times daily.  90 tablet  3  . aspirin EC 81 MG tablet Take 81 mg by mouth daily.      . carvedilol (COREG) 12.5 MG tablet Take 12.5 mg by mouth 2 (two) times daily with a meal.      . Chromium-Cinnamon (CINNAMON PLUS CHROMIUM PO) Take 1 tablet by mouth 2 (two) times daily.       . fish oil-omega-3 fatty acids 1000 MG capsule Take 1 g by mouth 2 (two) times daily.       . Flaxseed, Linseed, 1300 MG CAPS Take 1,300 mg by  mouth 2 (two) times daily.       . furosemide (LASIX) 40 MG tablet Take 0.5 tablets (20 mg total) by mouth daily.  90 tablet  3  . glipiZIDE (GLUCOTROL) 10 MG tablet Take 10 mg by mouth daily as needed (for high blood surgars). For high blood sugars      . insulin glargine (LANTUS) 100 UNIT/ML injection Inject 20 Units into the skin daily as needed (elevated blood sugars).       . isosorbide mononitrate (IMDUR) 30 MG 24 hr tablet Take 30 mg by mouth daily as needed.      [name] Kitchen lisinopril-hydrochlorothiazide (PRINZIDE,ZESTORETIC) 20-12.5 MG per tablet Take 1 tablet by mouth daily.  90 tablet  3  . metFORMIN (GLUCOPHAGE) 500 MG tablet Take 500 mg by mouth 4 (four) times daily.      . Multiple Vitamin (MULTIVITAMIN WITH MINERALS) TABS Take 1 tablet by mouth every morning.      . nitroGLYCERIN (NITROSTAT) 0.4 MG SL tablet Place 1 tablet (0.4 mg total) under the tongue every 5 (five) minutes as needed. For chest pain  25 tablet  11  . warfarin (COUMADIN) 5 MG tablet Take 5 mg by mouth daily.        Assessment: 73 yo male admitted with weakness/supratherapeutic INR, h/o Afib, for Coumadin.  Vitamin K  5 mg IV given in ED  Goal of Therapy:  INR 2-3 Monitor platelets by anticoagulation protocol: Yes   Plan:  F/U daily INR  Caryl Pina 03/17/2013,11:06 PM

## 2013-03-17 NOTE — ED Provider Notes (Signed)
Medical screening examination/treatment/procedure(s) were conducted as a shared visit with non-physician practitioner(s) and myself.  I personally evaluated the patient during the encounter.  EKG Interpretation   None       Patient is a 73 year old male with a history of hypertension, insulin-dependent diabetes, CAD, A. fib on Coumadin who has had 3 weeks of melena. He reports over the weekend, he was moving shovels into the back of his stroke and began to feel very weak and tired which is abnormal for him. He denies a chest pain or shortness of breath. He has had some dizziness with standing. He had a colonoscopy approximately 3 years ago which was normal. He's never had an endoscopy. Never had a GI bleed. Never had a blood transfusion. He was seen by his PCPs office today and his INR was 8.0 he was sent to the emergency department. Here he is hemodynamically stable with a benign abdominal exam but is strongly guaiac positive with melena no gross blood. Will give fresh frozen plasma, vitamin K, obtain labs, discussed with gastroenterology and admit.  [place], DO 03/17/13 1552

## 2013-03-17 NOTE — ED Notes (Signed)
Patient C/O having dark, black stools for 2 weeks.  Patient went to see MD and Reports and INR of 8.  MD sent patient to the ED.[name] Kitchen

## 2013-03-17 NOTE — ED Provider Notes (Signed)
CSN: [id number]     Arrival date & time 03/17/13  1335 History   First MD Initiated Contact with Patient 03/17/13 1449     No chief complaint on file.    (Consider location/radiation/quality/duration/timing/severity/associated sxs/prior Treatment) HPI Comments: Patient is a 73 year old male past medical history significant for DM, A. fib on Coumadin (10mg  daily), CAD, pacemaker placement presented to the emergency department for elevated INR at 8. Patient states the last 2 weeks he has had 3-4 episodes of loose black stools without associated moderate blood or abdominal pain or nausea or vomiting. He states he called his primary care doctor this morning who checked his INR and was notified he needed to immediately come to the emergency department because it was at 8. Patient denies any changes in his Coumadin dose in the last 4 weeks.    Past Medical History  Diagnosis Date  . Diabetes mellitus   . A-fib   . Complete heart block   . Coronary artery disease     seen at [facility] cardiology   . Hypertension     Dr. Veronia Beets, primary  . Sleep apnea     cpap, 11, last sleep study Jan2013, sees Dr. Keturah Barre  . Neuromuscular disorder     carpal tunnel bilaterally  . Polio     hx of  . Chronic back pain   . Pacemaker 03/07/2012    replaced  . Status post placement of cardiac pacemaker, 03/07/12 New Medtronic Gen and new Vent lead original placed 2008 03/08/2012  . Anticoagulant long-term use, on coumadin for PAF 03/08/2012  . Hx of echocardiogram 06/29/2008    EF 45-50%    Past Surgical History  Procedure Laterality Date  . Coronary artery bypass graft  1990  . Stents    . Coronary artery bypass graft  2004  . Maze procedure  2004  . Pacemaker insertion  08, 1/14  . Back surgery    . Cardiac catheterization    . Tonsillectomy    . Appendectomy    . Hernia repair    . Lumbar laminectomy/decompression microdiscectomy  01/10/2012    Procedure: LUMBAR LAMINECTOMY/DECOMPRESSION  MICRODISCECTOMY 1 LEVEL;  Surgeon: [name], MD;  Location: [place] [facility];  Service: Neurosurgery;  Laterality: Bilateral;  Lumbar three-four decompression, Posterior lateral fusion lumbar three-four, posterior spinus plate lumbar three-four  . Insert / replace / remove pacemaker  03/07/2012    replaced  . Joint replacement      Hx: of left knee  . Fracture surgery      Hx: of right leg  . Nasal septum surgery N/A 14  . Posterior fusion lumbar spine  08/28/2012    Dr Ronnald Ramp   Family History  Problem Relation Age of Onset  . Other Father     MVA  . Cancer Mother    History  Substance Use Topics  . Smoking status: Never Smoker   . Smokeless tobacco: Never Used  . Alcohol Use: No    Review of Systems  Constitutional: Positive for fatigue. Negative for fever and chills.  Gastrointestinal: Negative for nausea, vomiting and diarrhea.       Melena  All other systems reviewed and are negative.      Allergies  Review of patient's allergies indicates no known allergies.  Home Medications   Current Outpatient Rx  Name  Route  Sig  Dispense  Refill  . amLODipine (NORVASC) 10 MG tablet   Oral   Take 0.5 tablets (  5 mg total) by mouth 2 (two) times daily.   90 tablet   3   . aspirin EC 81 MG tablet   Oral   Take 81 mg by mouth daily.         . carvedilol (COREG) 12.5 MG tablet   Oral   Take 12.5 mg by mouth 2 (two) times daily with a meal.         . Chromium-Cinnamon (CINNAMON PLUS CHROMIUM PO)   Oral   Take 1 tablet by mouth 2 (two) times daily.          . fish oil-omega-3 fatty acids 1000 MG capsule   Oral   Take 1 g by mouth 2 (two) times daily.          . Flaxseed, Linseed, 1300 MG CAPS   Oral   Take 1,300 mg by mouth 2 (two) times daily.          . furosemide (LASIX) 40 MG tablet   Oral   Take 0.5 tablets (20 mg total) by mouth daily.   90 tablet   3   . glipiZIDE (GLUCOTROL) 10 MG tablet   Oral   Take 10 mg by mouth daily as needed (for  high blood surgars). For high blood sugars         . insulin glargine (LANTUS) 100 UNIT/ML injection   Subcutaneous   Inject 20 Units into the skin daily as needed (elevated blood sugars).          . isosorbide mononitrate (IMDUR) 30 MG 24 hr tablet   Oral   Take 30 mg by mouth daily as needed.         [name] Kitchen lisinopril-hydrochlorothiazide (PRINZIDE,ZESTORETIC) 20-12.5 MG per tablet   Oral   Take 1 tablet by mouth daily.   90 tablet   3   . metFORMIN (GLUCOPHAGE) 500 MG tablet   Oral   Take 500 mg by mouth 4 (four) times daily.         . Multiple Vitamin (MULTIVITAMIN WITH MINERALS) TABS   Oral   Take 1 tablet by mouth every morning.         . nitroGLYCERIN (NITROSTAT) 0.4 MG SL tablet   Sublingual   Place 1 tablet (0.4 mg total) under the tongue every 5 (five) minutes as needed. For chest pain   25 tablet   11   . warfarin (COUMADIN) 5 MG tablet   Oral   Take 5 mg by mouth daily.          BP 132/54  Pulse 61  Temp(Src) 98.7 F (37.1 C) (Oral)  Resp 22  SpO2 100% Physical Exam  Constitutional: He is oriented to person, place, and time. He appears well-developed and well-nourished. No distress.  HENT:  Head: Normocephalic and atraumatic.  Right Ear: External ear normal.  Left Ear: External ear normal.  Nose: Nose normal.  Mouth/Throat: Oropharynx is clear and moist.  Eyes: Conjunctivae are normal.  Neck: Normal range of motion. Neck supple.  Cardiovascular: Normal rate, regular rhythm and normal heart sounds.   Pulmonary/Chest: Effort normal and breath sounds normal. No respiratory distress.  Abdominal: Soft. Bowel sounds are normal. He exhibits no distension. There is no tenderness. There is no rebound and no guarding.  Musculoskeletal: Normal range of motion. He exhibits no edema.  Neurological: He is alert and oriented to person, place, and time.  Skin: Skin is warm and dry. He is not diaphoretic.  Psychiatric: He has a  normal mood and affect.     ED Course  Procedures (including critical care time) Medications  phytonadione (VITAMIN K) 5 mg in dextrose 5 % 50 mL IVPB (5 mg Intravenous New Bag/Given 03/17/13 1554)  pantoprazole (PROTONIX) injection 40 mg (40 mg Intravenous Given 03/17/13 1831)    Labs Review Labs Reviewed  LIPASE, BLOOD - Abnormal; Notable for the following:    Lipase 621 (*)    All other components within normal limits  COMPREHENSIVE METABOLIC PANEL - Abnormal; Notable for the following:    Sodium 134 (*)    Glucose, Bld 147 (*)    BUN 54 (*)    Creatinine, Ser 1.59 (*)    Total Bilirubin <0.2 (*)    GFR calc non Af Amer 42 (*)    GFR calc Af Amer 49 (*)    All other components within normal limits  CBC WITH DIFFERENTIAL - Abnormal; Notable for the following:    RBC 3.01 (*)    Hemoglobin 8.4 (*)    HCT 24.8 (*)    RDW 18.6 (*)    All other components within normal limits  PROTIME-INR - Abnormal; Notable for the following:    Prothrombin Time 58.3 (*)    INR 7.14 (*)    All other components within normal limits  APTT - Abnormal; Notable for the following:    aPTT 57 (*)    All other components within normal limits  OCCULT BLOOD, POC DEVICE - Abnormal; Notable for the following:    Fecal Occult Bld POSITIVE (*)    All other components within normal limits  URINALYSIS, ROUTINE W REFLEX MICROSCOPIC  OCCULT BLOOD X 1 CARD TO LAB, STOOL  POCT I-STAT TROPONIN I  TYPE AND SCREEN  PREPARE FRESH FROZEN PLASMA  PREPARE RBC (CROSSMATCH)   Imaging Review No results found.  EKG Interpretation    Date/Time:  Monday March 17 2013 14:08:26 EST Ventricular Rate:  62 PR Interval:  214 QRS Duration: 96 QT Interval:  420 QTC Calculation: 426 R Axis:   16 Text Interpretation:  Atrial-paced rhythm with prolonged AV conduction Abnormal ECG Confirmed by WARD  DO, KRISTEN [id number]) on 03/17/2013 3:01:07 PM            MDM   Final diagnoses:  Supratherapeutic INR    Filed Vitals:   03/17/13 1818   BP: 132/54  Pulse: 61  Temp: 98.7 F (37.1 C)  Resp: 22   Afebrile, NAD, non-toxic appearing, AAOx4. Patient presenting with supratherapeutic INR at 7.14 with 2 weeks of loose melenic stools. Abdomen is soft, nontender, nondistended. Melena appreciated on DRE. Hemoccult positive. Hgb at 8.4 dropped from 12 from previous visit 7 months ago. IV 5 mg vitamin K administered to patient. Packed red blood cells and fresh frozen plasma ordered. GI service consult who will see patient on floor. Patient will be admitted to hospitalist service. Patient d/w with Dr. Leonides Schanz, agrees with plan.       Harlow Mares, PA-C 03/17/13 1909

## 2013-03-17 NOTE — H&P (Signed)
[facility] History and Physical  Alan [name] [id number] DOB: [birth year] DOA: 03/17/2013  Referring physician: Dr. Leonides Schanz PCP: [name], MD   Chief Complaint: Melena with supratherapeutic INR  HPI: Alan [name] is a 73 y.o. male  With history of atrial fibrillation on Coumadin at home. States that he has been feeling weak starting since yesterday. The problem was gradual in onset and has been persistent since present. As a result he decided to come to the ED for further evaluation and recommendations. While in house was found to have a supratherapeutic INR of 7.0 and was given vitamin K mg  as well as typed and screened for transfusion of 2 units of packed red blood cells given his hemoglobin of 8.4 with reports of melena. He says the melena has been going on for 1 week and he denies any abdominal discomfort associated with it. Denies any recent fevers or chills.  We were subsequently consulted for further evaluation and management of supratherapeutic INR with melena.   Review of Systems:  Constitutional:  No weight loss, night sweats, Fevers, chills, fatigue.  HEENT:  No headaches, Difficulty swallowing,Tooth/dental problems,Sore throat,  No sneezing, itching, ear ache, nasal congestion, post nasal drip,  Cardio-vascular:  No chest pain, Orthopnea, PND, swelling in lower extremities, anasarca, dizziness, palpitations  GI:  No heartburn, indigestion, abdominal pain, nausea, vomiting, diarrhea, + change in bowel habits, loss of appetite  Resp:  No shortness of breath with exertion or at rest. No excess mucus, no productive cough, No non-productive cough, No coughing up of blood.No change in color of mucus.No wheezing.No chest wall deformity  Skin:  no rash or lesions.  GU:  no dysuria, change in color of urine, no urgency or frequency. No flank pain.  Musculoskeletal:  No joint pain or swelling. No decreased range of motion. No back pain.  Psych:  No change in  mood or affect. No depression or anxiety. No memory loss.   Past Medical History  Diagnosis Date  . Diabetes mellitus   . A-fib   . Complete heart block   . Coronary artery disease     seen at [facility] cardiology   . Hypertension     Dr. Veronia Beets, primary  . Sleep apnea     cpap, 11, last sleep study Jan2013, sees Dr. Keturah Barre  . Neuromuscular disorder     carpal tunnel bilaterally  . Polio     hx of  . Chronic back pain   . Pacemaker 03/07/2012    replaced  . Status post placement of cardiac pacemaker, 03/07/12 New Medtronic Gen and new Vent lead original placed 2008 03/08/2012  . Anticoagulant long-term use, on coumadin for PAF 03/08/2012  . Hx of echocardiogram 06/29/2008    EF 45-50%    Past Surgical History  Procedure Laterality Date  . Coronary artery bypass graft  1990  . Stents    . Coronary artery bypass graft  2004  . Maze procedure  2004  . Pacemaker insertion  08, 1/14  . Back surgery    . Cardiac catheterization    . Tonsillectomy    . Appendectomy    . Hernia repair    . Lumbar laminectomy/decompression microdiscectomy  01/10/2012    Procedure: LUMBAR LAMINECTOMY/DECOMPRESSION MICRODISCECTOMY 1 LEVEL;  Surgeon: [name], MD;  Location: [place] [facility];  Service: Neurosurgery;  Laterality: Bilateral;  Lumbar three-four decompression, Posterior lateral fusion lumbar three-four, posterior spinus plate lumbar three-four  . Insert / replace /  remove pacemaker  03/07/2012    replaced  . Joint replacement      Hx: of left knee  . Fracture surgery      Hx: of right leg  . Nasal septum surgery N/A 14  . Posterior fusion lumbar spine  08/28/2012    Dr Ronnald Ramp   Social History:  reports that he has never smoked. He has never used smokeless tobacco. He reports that he does not drink alcohol or use illicit drugs.  No Known Allergies  Family History  Problem Relation Age of Onset  . Other Father     MVA  . Cancer Mother      Prior to Admission  medications   Medication Sig Start Date End Date Taking? Authorizing Provider  amLODipine (NORVASC) 10 MG tablet Take 0.5 tablets (5 mg total) by mouth 2 (two) times daily. 12/23/12  Yes Mihai [name], MD  aspirin EC 81 MG tablet Take 81 mg by mouth daily.   Yes Historical Provider, MD  carvedilol (COREG) 12.5 MG tablet Take 12.5 mg by mouth 2 (two) times daily with a meal.   Yes Historical Provider, MD  Chromium-Cinnamon (CINNAMON PLUS CHROMIUM PO) Take 1 tablet by mouth 2 (two) times daily.    Yes Historical Provider, MD  fish oil-omega-3 fatty acids 1000 MG capsule Take 1 g by mouth 2 (two) times daily.    Yes Historical Provider, MD  Flaxseed, Linseed, 1300 MG CAPS Take 1,300 mg by mouth 2 (two) times daily.    Yes Historical Provider, MD  furosemide (LASIX) 40 MG tablet Take 0.5 tablets (20 mg total) by mouth daily. 10/08/12  Yes Mihai [name], MD  glipiZIDE (GLUCOTROL) 10 MG tablet Take 10 mg by mouth daily as needed (for high blood surgars). For high blood sugars   Yes Historical Provider, MD  insulin glargine (LANTUS) 100 UNIT/ML injection Inject 20 Units into the skin daily as needed (elevated blood sugars).    Yes Historical Provider, MD  isosorbide mononitrate (IMDUR) 30 MG 24 hr tablet Take 30 mg by mouth daily as needed. 10/23/12  Yes Mihai [name], MD  lisinopril-hydrochlorothiazide (PRINZIDE,ZESTORETIC) 20-12.5 MG per tablet Take 1 tablet by mouth daily. 10/23/12  Yes Mihai [name], MD  metFORMIN (GLUCOPHAGE) 500 MG tablet Take 500 mg by mouth 4 (four) times daily. 12/21/10  Yes Erlene Quan, PA-C  Multiple Vitamin (MULTIVITAMIN WITH MINERALS) TABS Take 1 tablet by mouth every morning.   Yes Historical Provider, MD  nitroGLYCERIN (NITROSTAT) 0.4 MG SL tablet Place 1 tablet (0.4 mg total) under the tongue every 5 (five) minutes as needed. For chest pain 10/23/12  Yes Mihai [name], MD  warfarin (COUMADIN) 5 MG tablet Take 5 mg by mouth daily.   Yes Historical Provider, MD   Physical  Exam: Filed Vitals:   03/17/13 1617  BP: 120/66  Pulse: 83  Temp: 99.2 F (37.3 C)  Resp: 16    BP 120/66  Pulse 83  Temp(Src) 99.2 F (37.3 C) (Oral)  Resp 16  SpO2 99%  General:  Appears calm and comfortable Eyes: PERRL, normal lids, irises & conjunctiva ENT: grossly normal hearing, lips & tongue Neck: no LAD, masses or thyromegaly Cardiovascular: RRR, no m/r/g. No LE edema. Telemetry: SR, no arrhythmias  Respiratory: CTA bilaterally, no w/r/r. Normal respiratory effort. Abdomen: soft, ntnd Skin: no rash or induration seen on limited exam Musculoskeletal: grossly normal tone BUE/BLE Psychiatric: grossly normal mood and affect, speech fluent and appropriate Neurologic: grossly non-focal.  Labs on Admission:  Basic Metabolic Panel:  Recent Labs Lab 03/17/13 1430  NA 134*  K 4.2  CL 97  CO2 22  GLUCOSE 147*  BUN 54*  CREATININE 1.59*  CALCIUM 9.2   Liver Function Tests:  Recent Labs Lab 03/17/13 1430  AST 24  ALT 23  ALKPHOS 48  BILITOT <0.2*  PROT 8.0  ALBUMIN 3.8    Recent Labs Lab 03/17/13 1430  LIPASE 621*   No results found for this basename: AMMONIA,  in the last 168 hours CBC:  Recent Labs Lab 03/17/13 1430  WBC 5.9  NEUTROABS 3.7  HGB 8.4*  HCT 24.8*  MCV 82.4  PLT 203   Cardiac Enzymes: No results found for this basename: CKTOTAL, CKMB, CKMBINDEX, TROPONINI,  in the last 168 hours  BNP (last 3 results) No results found for this basename: PROBNP,  in the last 8760 hours CBG: No results found for this basename: GLUCAP,  in the last 168 hours  Radiological Exams on Admission: No results found.  EKG: Independently reviewed. Atrial paced rhythm with no ST elevations or depressions  Assessment/Plan Active Problems:   Supratherapeutic INR - Will continue carvedilol - hold coumadin given supratherapeutic INR - Patient given 5 mg of Vitamin K in the ED  Melena - 2ary to principle problem listed above - ED will  discussed with GI physician - suspect resolution with INR reversal which has been started in the ED  CAD - Holding aspirin at this point.  DM - Hold oral hypoglycemic agents - SSI  HTN - Will hold given given primary problem - continue carvedilol for rate control  Code Status: full Family Communication: discussed with patient and family at bedside. Disposition Plan: telemetry with reversal of INR  Time spent: > 60  Velvet Bathe [facility] Pager [phone]

## 2013-03-18 ENCOUNTER — Encounter (HOSPITAL_COMMUNITY): Payer: Self-pay | Admitting: *Deleted

## 2013-03-18 ENCOUNTER — Encounter (HOSPITAL_COMMUNITY)
Admission: EM | Disposition: A | Discharge status: Home / Self Care | Payer: Medicare Other | Source: Home / Self Care | Attending: Internal Medicine

## 2013-03-18 DIAGNOSIS — R791 Abnormal coagulation profile: Secondary | ICD-10-CM

## 2013-03-18 DIAGNOSIS — I4891 Unspecified atrial fibrillation: Secondary | ICD-10-CM

## 2013-03-18 DIAGNOSIS — B91 Sequelae of poliomyelitis: Secondary | ICD-10-CM

## 2013-03-18 DIAGNOSIS — I10 Essential (primary) hypertension: Secondary | ICD-10-CM

## 2013-03-18 DIAGNOSIS — K921 Melena: Principal | ICD-10-CM

## 2013-03-18 DIAGNOSIS — G4733 Obstructive sleep apnea (adult) (pediatric): Secondary | ICD-10-CM

## 2013-03-18 DIAGNOSIS — E119 Type 2 diabetes mellitus without complications: Secondary | ICD-10-CM

## 2013-03-18 DIAGNOSIS — E785 Hyperlipidemia, unspecified: Secondary | ICD-10-CM

## 2013-03-18 HISTORY — PX: ESOPHAGOGASTRODUODENOSCOPY: SHX5428

## 2013-03-18 LAB — CBC
HCT: 23 % — ABNORMAL LOW (ref 39.0–52.0)
HCT: 25.7 % — ABNORMAL LOW (ref 39.0–52.0)
HEMATOCRIT: 25.8 % — AB (ref 39.0–52.0)
HEMATOCRIT: 26 % — AB (ref 39.0–52.0)
HEMOGLOBIN: 7.7 g/dL — AB (ref 13.0–17.0)
Hemoglobin: 8.8 g/dL — ABNORMAL LOW (ref 13.0–17.0)
Hemoglobin: 8.7 g/dL — ABNORMAL LOW (ref 13.0–17.0)
Hemoglobin: 8.6 g/dL — ABNORMAL LOW (ref 13.0–17.0)
MCH: 27.5 pg (ref 26.0–34.0)
MCH: 27.7 pg (ref 26.0–34.0)
MCH: 27.7 pg (ref 26.0–34.0)
MCH: 28 pg (ref 26.0–34.0)
MCHC: 33.5 g/dL (ref 30.0–36.0)
MCHC: 33.5 g/dL (ref 30.0–36.0)
MCHC: 33.5 g/dL (ref 30.0–36.0)
MCHC: 34.1 g/dL (ref 30.0–36.0)
MCV: 82.1 fL (ref 78.0–100.0)
MCV: 82.2 fL (ref 78.0–100.0)
MCV: 82.6 fL (ref 78.0–100.0)
MCV: 82.8 fL (ref 78.0–100.0)
PLATELETS: 148 10*3/uL — AB (ref 150–400)
PLATELETS: 150 10*3/uL (ref 150–400)
PLATELETS: 156 10*3/uL (ref 150–400)
Platelets: 172 10*3/uL (ref 150–400)
RBC: 2.8 MIL/uL — AB (ref 4.22–5.81)
RBC: 3.11 MIL/uL — ABNORMAL LOW (ref 4.22–5.81)
RBC: 3.14 MIL/uL — ABNORMAL LOW (ref 4.22–5.81)
RBC: 3.14 MIL/uL — ABNORMAL LOW (ref 4.22–5.81)
RDW: 17.8 % — ABNORMAL HIGH (ref 11.5–15.5)
RDW: 17.9 % — AB (ref 11.5–15.5)
RDW: 17.9 % — ABNORMAL HIGH (ref 11.5–15.5)
RDW: 18.3 % — ABNORMAL HIGH (ref 11.5–15.5)
WBC: 4.2 10*3/uL (ref 4.0–10.5)
WBC: 4.7 10*3/uL (ref 4.0–10.5)
WBC: 4.8 10*3/uL (ref 4.0–10.5)
WBC: 5.4 10*3/uL (ref 4.0–10.5)

## 2013-03-18 LAB — BASIC METABOLIC PANEL
BUN: 40 mg/dL — ABNORMAL HIGH (ref 6–23)
CALCIUM: 8.6 mg/dL (ref 8.4–10.5)
CO2: 22 meq/L (ref 19–32)
Chloride: 103 mEq/L (ref 96–112)
Creatinine, Ser: 1.42 mg/dL — ABNORMAL HIGH (ref 0.50–1.35)
GFR calc Af Amer: 56 mL/min — ABNORMAL LOW (ref >90)
GFR calc non Af Amer: 48 mL/min — ABNORMAL LOW (ref >90)
GLUCOSE: 183 mg/dL — AB (ref 70–99)
POTASSIUM: 4.5 meq/L (ref 3.7–5.3)
Sodium: 137 mEq/L (ref 137–147)

## 2013-03-18 LAB — PREPARE FRESH FROZEN PLASMA: Unit division: 0

## 2013-03-18 LAB — GLUCOSE, CAPILLARY
Glucose-Capillary: 171 mg/dL — ABNORMAL HIGH (ref 70–99)
Glucose-Capillary: 158 mg/dL — ABNORMAL HIGH (ref 70–99)
Glucose-Capillary: 147 mg/dL — ABNORMAL HIGH (ref 70–99)
Glucose-Capillary: 117 mg/dL — ABNORMAL HIGH (ref 70–99)
Glucose-Capillary: 211 mg/dL — ABNORMAL HIGH (ref 70–99)

## 2013-03-18 LAB — PROTIME-INR
INR: 1.84 — ABNORMAL HIGH (ref 0.00–1.49)
Prothrombin Time: 20.7 seconds — ABNORMAL HIGH (ref 11.6–15.2)

## 2013-03-18 SURGERY — EGD (ESOPHAGOGASTRODUODENOSCOPY)
Anesthesia: Moderate Sedation | Laterality: Left

## 2013-03-18 MED ORDER — WARFARIN SODIUM 5 MG PO TABS
5.0000 mg | ORAL_TABLET | Freq: Once | ORAL | Status: AC
  Administered 2013-03-18: 5 mg via ORAL
  Filled 2013-03-18: qty 1

## 2013-03-18 MED ORDER — MIDAZOLAM HCL 5 MG/ML IJ SOLN
INTRAMUSCULAR | Status: AC
  Filled 2013-03-18: qty 2

## 2013-03-18 MED ORDER — BUTAMBEN-TETRACAINE-BENZOCAINE 2-2-14 % EX AERO
INHALATION_SPRAY | CUTANEOUS | Status: DC | PRN
  Administered 2013-03-18: 2 via TOPICAL

## 2013-03-18 MED ORDER — MIDAZOLAM HCL 10 MG/2ML IJ SOLN
INTRAMUSCULAR | Status: DC | PRN
  Administered 2013-03-18 (×4): 2 mg via INTRAVENOUS

## 2013-03-18 MED ORDER — FENTANYL CITRATE 0.05 MG/ML IJ SOLN
INTRAMUSCULAR | Status: AC
Start: 2013-03-18 — End: 2013-03-18
  Filled 2013-03-18: qty 2

## 2013-03-18 NOTE — Progress Notes (Signed)
ANTICOAGULATION CONSULT NOTE  Pharmacy Consult for Coumadin Indication: atrial fibrillation  No Known Allergies  Patient Measurements: Height: 5\' 10"  (177.8 cm) Weight: 230 lb 9.6 oz (104.599 kg) IBW/kg (Calculated) : 73  Vital Signs: Temp: 97.4 F (36.3 C) (02/10 1700) Temp src: Oral (02/10 1700) BP: 110/78 mmHg (02/10 1700) Pulse Rate: 63 (02/10 1700)  Labs:  Recent Labs  03/17/13 1430 03/17/13 1453 03/18/13 03/18/13 0704 03/18/13 1700  HGB 8.4*  --  7.7* 8.8* 8.7*  HCT 24.8*  --  23.0* 25.8* 26.0*  PLT 203  --  172 148* 150  APTT  --  57*  --   --   --   LABPROT 58.3*  --   --  20.7*  --   INR 7.14*  --   --  1.84*  --   CREATININE 1.59*  --   --  1.42*  --     Estimated Creatinine Clearance: 57.8 ml/min (by C-G formula based on Cr of 1.42).   Medical History: Past Medical History  Diagnosis Date  . Diabetes mellitus   . A-fib   . Complete heart block   . Coronary artery disease     seen at [facility] cardiology   . Hypertension     Dr. Veronia Beets, primary  . Sleep apnea     cpap, 11, last sleep study Jan2013, sees Dr. Keturah Barre  . Neuromuscular disorder     carpal tunnel bilaterally  . Polio     hx of  . Chronic back pain   . Pacemaker 03/07/2012    replaced  . Status post placement of cardiac pacemaker, 03/07/12 New Medtronic Gen and new Vent lead original placed 2008 03/08/2012  . Anticoagulant long-term use, on coumadin for PAF 03/08/2012  . Hx of echocardiogram 06/29/2008    EF 45-50%     Medications:  Prescriptions prior to admission  Medication Sig Dispense Refill  . amLODipine (NORVASC) 10 MG tablet Take 0.5 tablets (5 mg total) by mouth 2 (two) times daily.  90 tablet  3  . aspirin EC 81 MG tablet Take 81 mg by mouth daily.      . carvedilol (COREG) 12.5 MG tablet Take 12.5 mg by mouth 2 (two) times daily with a meal.      . Chromium-Cinnamon (CINNAMON PLUS CHROMIUM PO) Take 1 tablet by mouth 2 (two) times daily.       . fish  oil-omega-3 fatty acids 1000 MG capsule Take 1 g by mouth 2 (two) times daily.       . Flaxseed, Linseed, 1300 MG CAPS Take 1,300 mg by mouth 2 (two) times daily.       . furosemide (LASIX) 40 MG tablet Take 0.5 tablets (20 mg total) by mouth daily.  90 tablet  3  . glipiZIDE (GLUCOTROL) 10 MG tablet Take 10 mg by mouth daily as needed (for high blood surgars). For high blood sugars      . insulin glargine (LANTUS) 100 UNIT/ML injection Inject 20 Units into the skin daily as needed (elevated blood sugars).       . isosorbide mononitrate (IMDUR) 30 MG 24 hr tablet Take 30 mg by mouth daily as needed.      [name] Kitchen lisinopril-hydrochlorothiazide (PRINZIDE,ZESTORETIC) 20-12.5 MG per tablet Take 1 tablet by mouth daily.  90 tablet  3  . metFORMIN (GLUCOPHAGE) 500 MG tablet Take 500 mg by mouth 4 (four) times daily.      . Multiple Vitamin (MULTIVITAMIN WITH MINERALS)  TABS Take 1 tablet by mouth every morning.      . nitroGLYCERIN (NITROSTAT) 0.4 MG SL tablet Place 1 tablet (0.4 mg total) under the tongue every 5 (five) minutes as needed. For chest pain  25 tablet  11  . warfarin (COUMADIN) 5 MG tablet Take 5 mg by mouth daily.        Assessment: 73 yo male admitted with weakness/supratherapeutic INR, h/o Afib, for Coumadin.  Vitamin K  5 mg IV given in ED  S/p EGD today.  INR is now 1.84  Goal of Therapy:  INR 2-3 Monitor platelets by anticoagulation protocol: Yes   Plan:  Coumadin 5 mg po tonight. Daily PT/INR   Marcellous Snarski [place] 03/18/2013,7:37 PM

## 2013-03-18 NOTE — Care Management Note (Unsigned)
    Page 1 of 1   03/18/2013     3:48:13 PM   CARE MANAGEMENT NOTE 03/18/2013  Patient:  Alan [name], Alan [name]   Account Number:  [id number]  Date Initiated:  03/18/2013  Documentation initiated by:  GRAVES-BIGELOW,Deshunda Thackston  Subjective/Objective Assessment:   Pt admitted for Melena with supratherapeutic INR. Had FFP and PRBC transfusion.     Action/Plan:   CM will continue to monitor for disposition needs.   Anticipated DC Date:  03/20/2013   Anticipated DC Plan:  [place]  CM consult      Choice offered to / List presented to:             Status of service:  In process, will continue to follow Medicare Important Message given?   (If response is "NO", the following Medicare IM given date fields will be blank) Date Medicare IM given:   Date Additional Medicare IM given:    Discharge Disposition:    Per UR Regulation:  Reviewed for med. necessity/level of care/duration of stay  If discussed at [place] of Stay Meetings, dates discussed:    Comments:

## 2013-03-18 NOTE — Progress Notes (Signed)
Pt. Home cpap unit is missing a part, so pt. Agreed to wear one of our cpap units. Pt.is tolerating well at this time.

## 2013-03-18 NOTE — Op Note (Addendum)
[facility] [place], [id number]   ENDOSCOPY PROCEDURE REPORT  PATIENT: Churchill, Gere  MR#: [id number] BIRTHDATE: March 13, 1940 , 75  yrs. old GENDER: Male ENDOSCOPIST:Murl Golladay, MD REFERRED BY:  Dr. Veronia Beets PROCEDURE DATE:  03/18/2013 PROCEDURE:      upper endoscopy ASA CLASS: INDICATIONS:   melenic stools and severe drop in hemoglobin and significant elevation of BUN in a patient with a supratherapeutic INR (greater than 7), no ulcerogenic medications or prior history of ulcer disease MEDICATION:    Versed 8 mg IV (no fentanyl, history of unclear reaction to opiates) TOPICAL ANESTHETIC:    Cetacaine spray  DESCRIPTION OF PROCEDURE:   the procedure had been discussed with the patient by my partner, Dr. Wonda Horner, and the patient provided written consent and was brought from his hospital room to the [facility] cone endoscopy unit, where time out was performed in the above sedation was administered without clinical instability.  The Pentax video endoscope was passed under direct vision. The larynx looked grossly normal but the vocal cords were not well seen. The esophagus was entered without significant difficulty.  The esophagus was endoscopically normal except for a widely patent Schatzki's ring at the squamocolumnar junction, below which was a 3 cm hiatal hernia. No Mallory-Weiss tear, reflux esophagitis, Barrett's esophagus, varices, infection, or neoplasia were noted in the esophagus.  The stomach contained a tiny bilious residual, no blood or coffee-ground material. The stomach was free of abnormalities and in particular, no focal lesions such as erosions, ulcers, polyps, or masses were seen.  The pylorus, duodenal bulb, and second duodenum looked normal.  A retroflexed view of the cardia was normal.  The scope was removed and the patient tolerated the procedure well and without any apparent  complication.       COMPLICATIONS: None  ENDOSCOPIC IMPRESSION:  1. Small hiatal hernia with Schatzki's ring  2. Otherwise normal endoscopy. No source of melenic stool endoscopically evident.  RECOMMENDATIONS:  1. Okay for resumption of anticoagulation 2. No further GI tract evaluation needed at this time. It is noted that the patient has had colonoscopy in the past by Dr. Earlean Shawl within the past several years. 3. I would favor outpatient Hemoccults, with consideration for updated colonoscopy if the patient remains Hemoccult positive with a therapeutic INR, and/or if his hemoglobin fails to improve over the next one to 2 months.    _______________________________ eSigned:  Ronald Lobo, MD 03/18/2013 3:06 PM    PATIENT NAME:  Omani, Lanton MR#: [id number]

## 2013-03-18 NOTE — Progress Notes (Signed)
EGD well-tolerated.  No source of melena seen.  (This is commonly the case when the INR is supra-therapeutic.)  Please see dictated procedure report for details and recommendations.  I would recommend that this patient go home on lifelong PPI prophylaxis, assuming that he resumes his pre-admission medications which include the potentially dangerous combination of aspirin and warfarin.  I will sign off at this time. Please call if I can be of further assistance in this patient's care.  Cleotis Nipper, M.D. [phone]

## 2013-03-18 NOTE — Progress Notes (Signed)
[facility] PROGRESS NOTE  JACAI COSSAIRT [id number] DOB: [birth year] DOA: 03/17/2013 PCP: [name], MD  Assessment/Plan: Supratherapeutic INR  - Will continue Coumadin   - Coumadin per pharmacy  - Patient given 5 mg of Vitamin K in the ED - 2/10 INR.= 1.84  Melena  - 2ary to principle problem listed above  - Dr. Gwyndolyn Saxon [facility] to perform EGD 2/10    - suspect resolution with INR reversal which has been started in the ED   Atrial fibrillation  -Currently in NSR   CAD  - Holding aspirin at this point.   DM  - Hold oral hypoglycemic agents  -  Continue moderate SSI   HTN  - continue carvedilol for rate control; within AHA guidelines   Late effects poliomyelitis -Patient's right lower extremity continues to be fixed; stable  OSA -Continue CPAP using home settings    Code Status: full  Family Communication: discussed with patient and family at bedside.  Disposition Plan: telemetry with reversal of INR    Consultants: Dr. 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 Presented to ED stating he has been feeling weak starting since yesterday. The problem was gradual in onset and has been persistent since present. As a result he decided to come to the ED for further evaluation and recommendations. While in house was found to have a supratherapeutic INR of 7.0 and was given vitamin K mg as well as typed and screened for transfusion of 2 units of packed red blood cells given his hemoglobin of 8.4 with reports of melena. He says the melena has been going on for 1 week and he denies any abdominal discomfort associated with it. Denies any recent fevers  or chills. 2/10 Dr. Paulita Fujita scheduled patient for EGD today    Objective: Filed Vitals:   03/18/13 0215 03/18/13 0315 03/18/13 0350 03/18/13 0500  BP: 130/68 125/55 130/53 137/57  Pulse: 60 61 60 60  Temp: 97.1 F (36.2 C) 97 F (36.1 C) 97.6 F (36.4 C) 97.5 F (36.4 C)  TempSrc: Oral Oral    Resp: 18  18 18   Height:      Weight:      SpO2: 99% 100% 100% 99%    Intake/Output Summary (Last 24 hours) at 03/18/13 0955 Last data filed at 03/18/13 0900  Gross per 24 hour  Intake 1525.5 ml  Output      0 ml  Net 1525.5 ml   Filed Weights   03/17/13 2100  Weight: 104.599 kg (230 lb 9.6 oz)    Exam:   General:  A./O. x4, NAD  Cardiovascular: Regular rhythm and rate, negative murmurs rubs gallops, DP/PT pulse 2+ bilateral  Respiratory: Clear to auscultation bilateral  Abdomen: Soft, nontender, nondistended, plus bowel sound  Musculoskeletal: Right lower extremity positive 1+ pedal edema, patient unable to dorsiflex or plantarflex foot (secondary to polio/previous surgery to lengthen and leg)   Data Reviewed: Basic Metabolic Panel:  Recent Labs Lab 03/17/13 1430 03/18/13 0704  NA 134* 137  K 4.2 4.5  CL 97 103  CO2 22 22  GLUCOSE 147* 183*  BUN 54* 40*  CREATININE 1.59* 1.42*  CALCIUM 9.2 8.6   Liver Function Tests:  Recent Labs  Lab 03/17/13 1430  AST 24  ALT 23  ALKPHOS 48  BILITOT <0.2*  PROT 8.0  ALBUMIN 3.8    Recent Labs Lab 03/17/13 1430  LIPASE 621*   No results found for this basename: AMMONIA,  in the last 168 hours CBC:  Recent Labs Lab 03/17/13 1430 03/18/13 03/18/13 0704  WBC 5.9 5.4 4.8  NEUTROABS 3.7  --   --   HGB 8.4* 7.7* 8.8*  HCT 24.8* 23.0* 25.8*  MCV 82.4 82.1 82.2  PLT 203 172 148*   Cardiac Enzymes: No results found for this basename: CKTOTAL, CKMB, CKMBINDEX, TROPONINI,  in the last 168 hours BNP (last 3 results) No results found for this basename: PROBNP,  in the last 8760 hours CBG:  Recent Labs Lab  03/17/13 2019 03/18/13 0750  GLUCAP 121* 171*    No results found for this or any previous visit (from the past 240 hour(s)).   Studies: No results found.  Scheduled Meds: . carvedilol  12.5 mg Oral BID WC  . insulin aspart  0-15 Units Subcutaneous TID WC  . multivitamin with minerals  1 tablet Oral q morning - 10a  . omega-3 acid ethyl esters  1 g Oral BID  . pantoprazole (PROTONIX) IV  40 mg Intravenous Q12H  . sodium chloride  3 mL Intravenous Q12H  . Warfarin - Pharmacist Dosing Inpatient   Does not apply q1800   Continuous Infusions: . sodium chloride 75 mL/hr at 03/18/13 0936    Active Problems:   DIABETES MELLITUS   OBSTRUCTIVE SLEEP APNEA   ATRIAL FIBRILLATION, PAROXYSMAL   CORONARY ARTERY BYPASS GRAFT, HX OF, in 1990 and Re-do CABG 2004 with Atrial Maze procedure,    Hypertension   Pacemaker - Medtronic Adapta dual chamber   Anticoagulant long-term use, on coumadin for PAF   Dyslipidemia   Ischemic cardiomyopathy   CHB (complete heart block)   Supratherapeutic INR   Melena    Time spent: 40 minutes    WOODS, CURTIS, J  [facility] Pager [phone]. If 7PM-7AM, please contact night-coverage at [email or web address], password [facility] 03/18/2013, 9:55 AM  LOS: 1 day

## 2013-03-18 NOTE — Progress Notes (Signed)
Pt. States he can place cpap on himself. RT informed pt. To notify if he needs any assistance. 

## 2013-03-18 NOTE — Progress Notes (Signed)
UR Completed Tameka Hoiland Graves-Bigelow, RN,BSN [phone]  

## 2013-03-18 NOTE — Consult Note (Signed)
[facility] Gastroenterology Consultation Note  Referring Provider: Dr. Velvet Bathe [facility]) Primary Care Physician:  [name], MD Primary Gastroenterologist:  Dr. Richmond Campbell  Reason for Consultation:  melena  HPI: Alan [name] is a 73 y.o. male whom we've been asked to see for melena.  Started about one week ago, also with a couple days' of fatigue.  No abdominal pain, nausea, vomiting, hematochezia.  On warfarin for CAD and atrial fibrillation, patient's recent INR > 7, which patient attributes to some antibiotics and acetaminophen products he was taking recently after a left carpal tunnel release surgery.  No prior endoscopy.  Has had several colonoscopies by Dr. Earlean Shawl, last about 2-3 years ago per patient.  No prior GI bleeding.   Past Medical History  Diagnosis Date  . Diabetes mellitus   . A-fib   . Complete heart block   . Coronary artery disease     seen at [facility] cardiology   . Hypertension     Dr. Veronia Beets, primary  . Sleep apnea     cpap, 11, last sleep study Jan2013, sees Dr. Keturah Barre  . Neuromuscular disorder     carpal tunnel bilaterally  . Polio     hx of  . Chronic back pain   . Pacemaker 03/07/2012    replaced  . Status post placement of cardiac pacemaker, 03/07/12 New Medtronic Gen and new Vent lead original placed 2008 03/08/2012  . Anticoagulant long-term use, on coumadin for PAF 03/08/2012  . Hx of echocardiogram 06/29/2008    EF 45-50%     Past Surgical History  Procedure Laterality Date  . Coronary artery bypass graft  1990  . Stents    . Coronary artery bypass graft  2004  . Maze procedure  2004  . Pacemaker insertion  08, 1/14  . Back surgery    . Cardiac catheterization    . Tonsillectomy    . Appendectomy    . Hernia repair    . Lumbar laminectomy/decompression microdiscectomy  01/10/2012    Procedure: LUMBAR LAMINECTOMY/DECOMPRESSION MICRODISCECTOMY 1 LEVEL;  Surgeon: [name], MD;  Location: [place] [facility];  Service:  Neurosurgery;  Laterality: Bilateral;  Lumbar three-four decompression, Posterior lateral fusion lumbar three-four, posterior spinus plate lumbar three-four  . Insert / replace / remove pacemaker  03/07/2012    replaced  . Joint replacement      Hx: of left knee  . Fracture surgery      Hx: of right leg  . Nasal septum surgery N/A 14  . Posterior fusion lumbar spine  08/28/2012    Dr Ronnald Ramp    Prior to Admission medications   Medication Sig Start Date End Date Taking? Authorizing Provider  amLODipine (NORVASC) 10 MG tablet Take 0.5 tablets (5 mg total) by mouth 2 (two) times daily. 12/23/12  Yes Mihai [name], MD  aspirin EC 81 MG tablet Take 81 mg by mouth daily.   Yes Historical Provider, MD  carvedilol (COREG) 12.5 MG tablet Take 12.5 mg by mouth 2 (two) times daily with a meal.   Yes Historical Provider, MD  Chromium-Cinnamon (CINNAMON PLUS CHROMIUM PO) Take 1 tablet by mouth 2 (two) times daily.    Yes Historical Provider, MD  fish oil-omega-3 fatty acids 1000 MG capsule Take 1 g by mouth 2 (two) times daily.    Yes Historical Provider, MD  Flaxseed, Linseed, 1300 MG CAPS Take 1,300 mg by mouth 2 (two) times daily.    Yes Historical Provider, MD  furosemide (LASIX) 40 MG tablet Take 0.5 tablets (20 mg total) by mouth daily. 10/08/12  Yes Mihai [name], MD  glipiZIDE (GLUCOTROL) 10 MG tablet Take 10 mg by mouth daily as needed (for high blood surgars). For high blood sugars   Yes Historical Provider, MD  insulin glargine (LANTUS) 100 UNIT/ML injection Inject 20 Units into the skin daily as needed (elevated blood sugars).    Yes Historical Provider, MD  isosorbide mononitrate (IMDUR) 30 MG 24 hr tablet Take 30 mg by mouth daily as needed. 10/23/12  Yes Mihai [name], MD  lisinopril-hydrochlorothiazide (PRINZIDE,ZESTORETIC) 20-12.5 MG per tablet Take 1 tablet by mouth daily. 10/23/12  Yes Mihai [name], MD  metFORMIN (GLUCOPHAGE) 500 MG tablet Take 500 mg by mouth 4 (four) times daily.  12/21/10  Yes Erlene Quan, PA-C  Multiple Vitamin (MULTIVITAMIN WITH MINERALS) TABS Take 1 tablet by mouth every morning.   Yes Historical Provider, MD  nitroGLYCERIN (NITROSTAT) 0.4 MG SL tablet Place 1 tablet (0.4 mg total) under the tongue every 5 (five) minutes as needed. For chest pain 10/23/12  Yes Mihai [name], MD  warfarin (COUMADIN) 5 MG tablet Take 5 mg by mouth daily.   Yes Historical Provider, MD    Current Facility-Administered Medications  Medication Dose Route Frequency Provider Last Rate Last Dose  . 0.9 %  sodium chloride infusion   Intravenous Continuous Velvet Bathe, MD 75 mL/hr at 03/17/13 2315 1,000 mL at 03/17/13 2315  . carvedilol (COREG) tablet 12.5 mg  12.5 mg Oral BID WC Velvet Bathe, MD   12.5 mg at 03/17/13 2342  . insulin aspart (novoLOG) injection 0-15 Units  0-15 Units Subcutaneous TID WC Velvet Bathe, MD      . multivitamin with minerals tablet 1 tablet  1 tablet Oral q morning - 10a Velvet Bathe, MD      . nitroGLYCERIN (NITROSTAT) SL tablet 0.4 mg  0.4 mg Sublingual Q5 min PRN Velvet Bathe, MD      . omega-3 acid ethyl esters (LOVAZA) capsule 1 g  1 g Oral BID Velvet Bathe, MD   1 g at 03/17/13 2314  . ondansetron (ZOFRAN) tablet 4 mg  4 mg Oral Q6H PRN Velvet Bathe, MD       Or  . ondansetron (ZOFRAN) injection 4 mg  4 mg Intravenous Q6H PRN Velvet Bathe, MD      . pantoprazole (PROTONIX) injection 40 mg  40 mg Intravenous Q12H Velvet Bathe, MD      . sodium chloride 0.9 % injection 3 mL  3 mL Intravenous Q12H Velvet Bathe, MD   3 mL at 03/17/13 2314  . Warfarin - Pharmacist Dosing Inpatient   Does not apply Pierce, MD        Allergies as of 03/17/2013  . (No Known Allergies)    Family History  Problem Relation Age of Onset  . Other Father     MVA  . Cancer Mother     History   Social History  . Marital Status: Single    Spouse Name: N/A    Number of Children: N/A  . Years of Education: N/A   Occupational History  .  pilot-retired    Social History Main Topics  . Smoking status: Never Smoker   . Smokeless tobacco: Never Used  . Alcohol Use: No  . Drug Use: No  . Sexual Activity: Yes   Other Topics Concern  . Not on file   Social History Narrative   Lives on  [place] @ [employer], fishes.     Review of Systems: ROS Dr. Wendee Beavers 03/17/13 reviewed and I agree  Physical Exam: Vital signs in last 24 hours: Temp:  [97 F (36.1 C)-99.2 F (37.3 C)] 97.5 F (36.4 C) (02/10 0500) Pulse Rate:  [59-83] 60 (02/10 0500) Resp:  [16-23] 18 (02/10 0500) BP: (120-145)/(49-68) 137/57 mmHg (02/10 0500) SpO2:  [96 %-100 %] 99 % (02/10 0500) Weight:  [104.599 kg (230 lb 9.6 oz)] 104.599 kg (230 lb 9.6 oz) (02/09 2100) Last BM Date: 03/17/13 General:   Alert,  Well-developed, well-nourished, pleasant and cooperative in NAD Head:  Normocephalic and atraumatic. Eyes:  Sclera clear, no icterus.   Conjunctiva pink. Ears:  Normal auditory acuity. Nose:  No deformity, discharge,  or lesions. Mouth:  No deformity or lesions.  Oropharynx pink & moist. Neck:  Supple; no masses or thyromegaly. Lungs:  Clear throughout to auscultation.   No wheezes, crackles, or rhonchi. No acute distress. Heart:  Irregularly irregular rhythm, regular rate; no murmurs, clicks, rubs,  or gallops. Abdomen:  Soft, nontender and nondistended. No masses, hepatosplenomegaly or hernias noted. Normal bowel sounds, without guarding, and without rebound.     Msk:  Symmetrical without gross deformities. Normal posture. Pulses:  Normal pulses noted. Extremities:  Carpal tunnel release surgical scar left wrist.  Without clubbing or edema. Neurologic:  Alert and  oriented x4;  Diffusely weak, otherwise grossly normal neurologically. Skin:  Scattered ecchymoses, otherwise intact without significant lesions or rashes. Cervical Nodes:  No significant cervical adenopathy. Psych:  Alert and cooperative. Normal mood and affect.   Lab  Results:  Recent Labs  03/17/13 1430 03/18/13 03/18/13 0704  WBC 5.9 5.4 4.8  HGB 8.4* 7.7* 8.8*  HCT 24.8* 23.0* 25.8*  PLT 203 172 148*   BMET  Recent Labs  03/17/13 1430 03/18/13 0704  NA 134* 137  K 4.2 4.5  CL 97 103  CO2 22 22  GLUCOSE 147* 183*  BUN 54* 40*  CREATININE 1.59* 1.42*  CALCIUM 9.2 8.6   LFT  Recent Labs  03/17/13 1430  PROT 8.0  ALBUMIN 3.8  AST 24  ALT 23  ALKPHOS 48  BILITOT <0.2*   PT/INR  Recent Labs  03/17/13 1430 03/18/13 0704  LABPROT 58.3* 20.7*  INR 7.14* 1.84*    Studies/Results: No results found.  Impression:  1.  Melena with elevated BUN.  Suspect upper GI tract source of bleeding.  No further bleeding since admission.  Hemodynamically stable. 2.  Coagulopathy, resolved. 3.  Acute blood loss anemia.  Plan:  1.  NPO. 2.  PPI. 3.  Endoscopy later today, likely early afternoon (had some liquid breakfast this morning). 4.  Risks (bleeding, infection, bowel perforation that could require surgery, sedation-related changes in cardiopulmonary systems), benefits (identification and possible treatment of source of symptoms, exclusion of certain causes of symptoms), and alternatives (watchful waiting, radiographic imaging studies, empiric medical treatment) of upper endoscopy (EGD) were explained to patient/family in detail and patient wishes to proceed.   LOS: 1 day   Jamilia Jacques M  03/18/2013, 8:14 AM

## 2013-03-19 DIAGNOSIS — I2510 Atherosclerotic heart disease of native coronary artery without angina pectoris: Secondary | ICD-10-CM

## 2013-03-19 DIAGNOSIS — K922 Gastrointestinal hemorrhage, unspecified: Secondary | ICD-10-CM | POA: Diagnosis present

## 2013-03-19 DIAGNOSIS — Z7901 Long term (current) use of anticoagulants: Secondary | ICD-10-CM

## 2013-03-19 DIAGNOSIS — I442 Atrioventricular block, complete: Secondary | ICD-10-CM

## 2013-03-19 LAB — CBC
HEMATOCRIT: 27.1 % — AB (ref 39.0–52.0)
Hemoglobin: 9 g/dL — ABNORMAL LOW (ref 13.0–17.0)
MCH: 27.5 pg (ref 26.0–34.0)
MCHC: 33.2 g/dL (ref 30.0–36.0)
MCV: 82.9 fL (ref 78.0–100.0)
Platelets: 163 10*3/uL (ref 150–400)
RBC: 3.27 MIL/uL — ABNORMAL LOW (ref 4.22–5.81)
RDW: 17.7 % — ABNORMAL HIGH (ref 11.5–15.5)
WBC: 4.9 10*3/uL (ref 4.0–10.5)

## 2013-03-19 LAB — TYPE AND SCREEN
ABO/RH(D): A POS
Antibody Screen: NEGATIVE
Unit division: 0
Unit division: 0

## 2013-03-19 LAB — GLUCOSE, CAPILLARY
GLUCOSE-CAPILLARY: 188 mg/dL — AB (ref 70–99)
Glucose-Capillary: 196 mg/dL — ABNORMAL HIGH (ref 70–99)

## 2013-03-19 LAB — PROTIME-INR
INR: 1.51 — ABNORMAL HIGH (ref 0.00–1.49)
Prothrombin Time: 17.8 seconds — ABNORMAL HIGH (ref 11.6–15.2)

## 2013-03-19 MED ORDER — PANTOPRAZOLE SODIUM 40 MG PO TBEC
40.0000 mg | DELAYED_RELEASE_TABLET | Freq: Every day | ORAL | Status: DC
  Administered 2013-03-19: 40 mg via ORAL

## 2013-03-19 MED ORDER — PANTOPRAZOLE SODIUM 40 MG PO TBEC: 40.0000 mg | DELAYED_RELEASE_TABLET | Freq: Every day | ORAL | Status: DC

## 2013-03-19 NOTE — Progress Notes (Signed)
Reviewed discharge instructions with patient and he stated his understanding.  Patient states he will followup with the coumadin clinic in the morning and request I not make followup appointment.  Awaiting ride home.   Sanda Linger

## 2013-03-19 NOTE — Discharge Instructions (Signed)
Anticoagulation, Generic Anticoagulants are medications used to prevent clots from developing in your veins. These medications are also known as blood thinners. If blood clots are untreated, they could travel to your lungs. This is called a pulmonary embolus. A blood clot in your lungs can be fatal.  Caregivers often use anticoagulants to prevent clots following surgery. Anticoagulants are also used along with aspirin when the heart is not getting enough blood. Another anticoagulant called warfarin is started 2 to 3 days after a rapid-acting injectable anticoagulant is started. The rapid-acting anticoagulants are usually continued until warfarin has begun to work. Your caregiver will judge this length of time by blood tests known as the prothrombin time (PT) and International Normalization Ratio (INR). This means that your blood is at the necessary and best level to prevent clots. RISKS AND COMPLICATIONS  If you have received recent epidural anesthesia, spinal anesthesia, or a spinal tap while receiving anticoagulants, you are at risk for developing a blood clot in or around the spine. This condition could result in long-term or permanent paralysis.  Because anticoagulants thin your blood, severe bleeding may occur from any tissue or organ. Symptoms of the blood being too thin may include:  Bleeding from the nose or gums that does not stop quickly.  Unusual bruising or bruising easily.  Swelling or pain at an injection site.  A cut that does not stop bleeding within 10 minutes.  Continual nausea for more than 1 day or vomiting blood.  Coughing up blood.  Blood in the urine which may appear as pink, red, or brown urine.  Blood in bowel movements which may appear as red, dark or black stools.  Sudden weakness or numbness of the face, arm, or leg, especially on one side of the body.  Sudden confusion.  Trouble speaking (aphasia) or understanding.  Sudden trouble seeing in one or both  eyes.  Sudden trouble walking.  Dizziness.  Loss of balance or coordination.  Severe pain, such as a headache, joint pain, or back pain.  Fever.  Too little anticoagulation continues to allow the risk for blood clots. HOME CARE INSTRUCTIONS   Due to the complications of anticoagulants, it is very important that you take your anticoagulant as directed by your caregiver. Anticoagulants need to be taken exactly as instructed. Be sure you understand all your anticoagulant instructions.  Warfarin. Your caregiver will advise you on the length of treatment (usually 3 6 months, sometimes lifelong).  Take warfarin exactly as directed by your caregiver. It is recommended that you take your warfarin dose at the same time of the day. It is preferred that you take warfarin in the late afternoon. If you have been told to stop taking warfarin, do not resume taking warfarin until directed to do so by your caregiver. Follow your caregiver's instructions if you accidentally take an extra dose or miss a dose of warfarin. It is very important to take warfarin as directed since bleeding or blood clots could result in chronic or permanent injury, pain, or disability.  Too much and too little warfarin are both dangerous. Too much warfarin increases the risk of bleeding. Too little warfarin continues to allow the risk for blood clots. While taking warfarin, you will need to have regular blood tests to measure your blood clotting time. These blood tests usually include both the PT and INR tests. The PT and INR results allow your caregiver to adjust your dose of warfarin. The dose can change for many reasons. It is critically  important that you take warfarin exactly as prescribed, and that you have your PT and INR levels drawn exactly as directed. Follow up with your laboratory test appointments as directed. It is very important to keep your lab appointments. Not keeping lab appointments could result in a chronic or  permanent injury, pain, or disability.  Many foods, especially foods high in vitamin K can interfere with warfarin and affect the PT and INR results. Foods high in vitamin K include spinach, kale, broccoli, cabbage, collard and turnip greens, brussels sprouts, peas, cauliflower, seaweed, and parsley as well as beef and pork liver, green tea, and soybean oil. You should eat a consistent amount of foods high in vitamin K. Avoid major changes in your diet, or notify your caregiver before changing your diet. Arrange a visit with a dietitian to answer your questions.  Many medicines can interfere with warfarin and affect the PT and INR results. You must tell your caregiver about any and all medicines you take, this includes all vitamins and supplements. Ask your caregiver before taking these. Prescription and over-the-counter medicine consistency is critical to warfarin management. It is important that potential interactions are checked before you start a new medicine. Be especially cautious with aspirin and anti-inflammatory medicines. Ask your caregiver before taking these. Medicines such as antibiotics and acid-reducing medicine can interact with warfarin and can cause an increased warfarin effect. Warfarin can also interfere with the effectiveness of medicines you are taking. Do not take or discontinue any prescribed or over-the-counter medicine except on the advice of your caregiver or pharmacist.  Some vitamins, supplements, and herbal products interfere with the effectiveness of warfarin. Vitamin E may increase the anticoagulant effects of warfarin. Vitamin K may can cause warfarin to be less effective. Do not take or discontinue any vitamin, supplement, or herbal product except on the advice of your caregiver or pharmacist.  Alcohol can change the body's ability to handle warfarin. It is best to avoid alcoholic drinks or consume only very small amounts while taking warfarin. Notify your caregiver if you  change your alcohol intake. A sudden increase in alcohol use can increase your risk of bleeding. Chronic alcohol use can cause warfarin to be less effective.  If you have a loss of appetite or get the stomach flu (viral gastroenteritis), talk to your caregiver as soon as possible. A decrease in your normal vitamin K intake can make you more sensitive to your usual dose of warfarin.  Some medical conditions may increase your risk for bleeding while you are taking warfarin. A fever, diarrhea lasting more than a day, worsening heart failure, or worsening liver function are some medical conditions that could affect warfarin. Contact your caregiver if you have any of these medical conditions.  Warfarin can have side effects, such as excessive bruising or bleeding. You will need to hold pressure over cuts for longer than usual.  Be careful not to cut yourself when using sharp objects.  Notify your dentist or other caregivers before procedures.  Limit physical activities or sports that could result in a fall or cause injury. Avoid contact sports.  Wear a medical alert bracelet or carry a medical alert card. SEEK MEDICAL CARE IF:   You develop any rashes.  You have any worsening of the condition for which you are receiving anticoagulation therapy. SEEK IMMEDIATE MEDICAL CARE IF:   Bleeding from the nose or gums does not stop quickly.  You have unusual bruising or are bruising easily.  Swelling or pain occurs  at an injection site.  A cut does not stop bleeding within 10 minutes.  You have continual nausea for more than 1 day or are vomiting blood.  You are coughing up blood.  You have blood in the urine.  You have dark or black stools.  You have sudden weakness or numbness of the face, arm, or leg, especially on one side of the body.  You have sudden confusion.  You have trouble speaking (aphasia) or understanding.  You have sudden trouble seeing in one or both eyes.  You have  sudden trouble walking.  You have dizziness.  You have a loss of balance or coordination.  You have severe pain, such as a headache, joint pain, or back pain.  You have a serious fall or head injury, even if you are not bleeding.  You have an oral temperature above 102 F (38.9 C), not controlled by medicine. ANY OF THESE SYMPTOMS MAY REPRESENT A SERIOUS PROBLEM THAT IS AN EMERGENCY. Do not wait to see if the symptoms will go away. Get medical help right away. Call your local emergency services (911 in [place] DO NOT drive yourself to the hospital. MAKE SURE YOU:   Understand these instructions.  Will watch your condition.  Will get help right away if you are not doing well or get worse. Document Released: 01/23/2005 Document Revised: 10/18/2011 Document Reviewed: 08/28/2007 [facility] Patient Information 2014 [place].  Bloody Stools Bloody stools means there is blood in your poop (stool). It is a sign that there is a problem somewhere in the digestive system. It is important for your doctor to find the cause of your bleeding, so the problem can be treated.  HOME CARE  Only take medicine as told by your doctor.  Eat foods with fiber (prunes, bran cereals).  Drink enough fluids to keep your pee (urine) clear or pale yellow.  Sit in warm water (sitz bath) for 10 to 15 minutes as told by your doctor.  Know how to take your medicines (enemas, suppositories) if advised by your doctor.  Watch for signs that you are getting better or getting worse. GET HELP RIGHT AWAY IF:   You are not getting better.  You start to get better but then get worse again.  You have new problems.  You have severe bleeding from the place where poop comes out (rectum) that does not stop.  You throw up (vomit) blood.  You feel weak or pass out (faint).  You have a fever. MAKE SURE YOU:   Understand these instructions.  Will watch your condition.  Will get help right away if you are  not doing well or get worse. Document Released: 01/11/2009 Document Revised: 04/17/2011 Document Reviewed: 06/10/2010 [facility] Patient Information 2014 [place].  Gastrointestinal Bleeding Gastrointestinal bleeding is bleeding somewhere along the path that food travels through the body (digestive tract). This path is anywhere between the mouth and the opening of the butt (anus). You may have blood in your throw up (vomit) or in your poop (stools). If there is a lot of bleeding, you may need to stay in the hospital. [place]  Only take medicine as told by your doctor.  Eat foods with fiber such as whole grains, fruits, and vegetables. You can also try eating 1 to 3 prunes a day.  Drink enough fluids to keep your pee (urine) clear or pale yellow. GET HELP RIGHT AWAY IF:   Your bleeding gets worse.  You feel dizzy, weak, or you  pass out (faint).  You have bad cramps in your back or belly (abdomen).  You have large blood clumps (clots) in your poop.  Your problems are getting worse. MAKE SURE YOU:   Understand these instructions.  Will watch your condition.  Will get help right away if you are not doing well or get worse. Document Released: 11/02/2007 Document Revised: 01/10/2012 Document Reviewed: 01/02/2011 [facility] Patient Information 2014 [place], [place].

## 2013-03-19 NOTE — Discharge Summary (Signed)
Physician Discharge Summary  Patient ID: Alan [name] MRN: [id number] DOB/AGE: [birth year] 73 y.o.  Admit date: 03/17/2013 Discharge date: 03/19/2013  Primary Care Physician:  [name], MD  Discharge Diagnoses:    . Supratherapeutic INR . DIABETES MELLITUS . Hypertension . Melena with GI bleed . ATRIAL FIBRILLATION, PAROXYSMAL . CHB (complete heart block) . Dyslipidemia . Ischemic cardiomyopathy . OBSTRUCTIVE SLEEP APNEA  Consults: Gastroenterology   Recommendations for Outpatient Follow-up:  Coumadin was resumed from GI standpoint.  The patient should have periodic monitoring of Hemoccults outpatient and followup with his gastroenterologist  Please check CBC, BMET, PT/INR next week  Allergies:  No Known Allergies   Discharge Medications:   Medication List    STOP taking these medications       aspirin EC 81 MG tablet     furosemide 40 MG tablet  Commonly known as:  LASIX     lisinopril-hydrochlorothiazide 20-12.5 MG per tablet  Commonly known as:  PRINZIDE,ZESTORETIC      TAKE these medications       amLODipine 10 MG tablet  Commonly known as:  NORVASC  Take 0.5 tablets (5 mg total) by mouth 2 (two) times daily.     carvedilol 12.5 MG tablet  Commonly known as:  COREG  Take 12.5 mg by mouth 2 (two) times daily with a meal.     CINNAMON PLUS CHROMIUM PO  Take 1 tablet by mouth 2 (two) times daily.     fish oil-omega-3 fatty acids 1000 MG capsule  Take 1 g by mouth 2 (two) times daily.     Flaxseed (Linseed) 1300 MG Caps  Take 1,300 mg by mouth 2 (two) times daily.     glipiZIDE 10 MG tablet  Commonly known as:  GLUCOTROL  Take 10 mg by mouth daily as needed (for high blood surgars). For high blood sugars     insulin glargine 100 UNIT/ML injection  Commonly known as:  LANTUS  Inject 20 Units into the skin daily as needed (elevated blood sugars).     isosorbide mononitrate 30 MG 24 hr tablet  Commonly known as:  IMDUR  Take 30 mg by  mouth daily as needed.     metFORMIN 500 MG tablet  Commonly known as:  GLUCOPHAGE  Take 500 mg by mouth 4 (four) times daily.     multivitamin with minerals Tabs tablet  Take 1 tablet by mouth every morning.     nitroGLYCERIN 0.4 MG SL tablet  Commonly known as:  NITROSTAT  Place 1 tablet (0.4 mg total) under the tongue every 5 (five) minutes as needed. For chest pain     pantoprazole 40 MG tablet  Commonly known as:  PROTONIX  Take 1 tablet (40 mg total) by mouth daily.     warfarin 5 MG tablet  Commonly known as:  COUMADIN  Take 5 mg by mouth daily.         Brief H and P: For complete details please refer to admission H and P, but in brief Alan [name] is a 73 y.o. male  With history of atrial fibrillation on Coumadin at home. States that he has been feeling weak starting since yesterday. The problem was gradual in onset and has been persistent since present. As a result he decided to come to the ED for further evaluation and recommendations. While in house was found to have a supratherapeutic INR of 7.0 and was given vitamin K mg as well as typed and screened for  transfusion of 2 units of packed red blood cells given his hemoglobin of 8.4 with reports of melena. He says the melena has been going on for 1 week and he denied any abdominal discomfort associated with it. Denies any recent fevers or chills.   Hospital Course:  Patient is a 73 year old male with history of atrial fibrillation on Coumadin presented with supratherapeutic INR of 7.0 and melanotic stools for one week.  Melanotic stools with GI bleed likely due to supratherapeutic INR. GI was consulted and patient underwent endoscopy on 03/18/2013. EGD showed small hiatal hernia with Schatzki's ring otherwise normal endoscopy. GI recommended no further evaluation and recommended outpatient Hemoccults and an updated colonoscopy if patient has Hemoccult-positive stools. Patient was recommended to follow up with his  gastroenterologist,Dr. Earlean Shawl  Supratherapeutic INR likely the cause of GI bleed,  Coumadin was held and patient was given vitamin K for the reversal of Coumadin effect. Aspirin has been held. The patient underwent endoscopy which was essentially unremarkable. Melanotic stools had resolved after reversing Coumadin. Patient was recommended to follow up with his cardiologist, Dr. Sallyanne Kuster.      Procedures 03/18/2013  ENDOSCOPIC IMPRESSION:  1. Small hiatal hernia with Schatzki's ring  2. Otherwise normal endoscopy. No source of melenic stool  endoscopically evident.  RECOMMENDATIONS:  1. Okay for resumption of anticoagulation  2. No further GI tract evaluation needed at this time. It is noted  that the patient has had colonoscopy in the past by Dr. Earlean Shawl  within the past several years.  3. I would favor outpatient Hemoccults, with consideration for  updated colonoscopy if the patient remains Hemoccult positive with  a therapeutic INR, and/or if his hemoglobin fails to improve over  the next one to 2 months.    Day of Discharge BP 141/54  Pulse 64  Temp(Src) 98.6 F (37 C) (Oral)  Resp 20  Ht 5\' 10"  (1.778 m)  Wt 104.599 kg (230 lb 9.6 oz)  BMI 33.09 kg/m2  SpO2 99%  Physical Exam: General: Alert and awake oriented x3 not in any acute distress. HEENT: anicteric sclera, pupils reactive to light and accommodation CVS: S1-S2 clear no murmur rubs or gallops Chest: clear to auscultation bilaterally, no wheezing rales or rhonchi Abdomen: soft nontender, nondistended, normal bowel sounds Extremities: no cyanosis, clubbing or edema noted bilaterally Neuro: Cranial nerves II-XII intact, no focal neurological deficits   The results of significant diagnostics from this hospitalization (including imaging, microbiology, ancillary and laboratory) are listed below for reference.    LAB RESULTS: Basic Metabolic Panel:  Recent Labs Lab 03/17/13 1430 03/18/13 0704  NA 134* 137  K 4.2  4.5  CL 97 103  CO2 22 22  GLUCOSE 147* 183*  BUN 54* 40*  CREATININE 1.59* 1.42*  CALCIUM 9.2 8.6   Liver Function Tests:  Recent Labs Lab 03/17/13 1430  AST 24  ALT 23  ALKPHOS 48  BILITOT <0.2*  PROT 8.0  ALBUMIN 3.8    Recent Labs Lab 03/17/13 1430  LIPASE 621*   No results found for this basename: AMMONIA,  in the last 168 hours CBC:  Recent Labs Lab 03/17/13 1430  03/18/13 2253 03/19/13 0635  WBC 5.9  < > 4.7 4.9  NEUTROABS 3.7  --   --   --   HGB 8.4*  < > 8.6* 9.0*  HCT 24.8*  < > 25.7* 27.1*  MCV 82.4  < > 82.6 82.9  PLT 203  < > 156 163  < > =  values in this interval not displayed. Cardiac Enzymes: No results found for this basename: CKTOTAL, CKMB, CKMBINDEX, TROPONINI,  in the last 168 hours BNP: No components found with this basename: POCBNP,  CBG:  Recent Labs Lab 03/18/13 2132 03/19/13 0749  GLUCAP 211* 196*    Significant Diagnostic Studies:  No results found.     Disposition and Follow-up:    DISPOSITION: At home  DIET:Carb modified diet   TESTS THAT NEED FOLLOW-UP CBC, CMET   DISCHARGE FOLLOW-UP     Follow-up Information   Follow up with BADGER,MICHAEL C, MD. Schedule an appointment as soon as possible for a visit in 1 week. (obtain labs (CBC,PT/ INR, BMET), for hospital follow-up)    Specialty:  Family Medicine   Contact information:   [place] [address] [phone]       Follow up with Sanda Klein, MD. Schedule an appointment as soon as possible for a visit in 2 weeks. (for hospital follow-up. Please discuss about coumadin and other blood thinner agents. )    Specialty:  Cardiology   Contact information:   [address] [place] [address] [phone]       Time spent on Discharge: 40 mins   Signed:   RAI,RIPUDEEP M.D. [facility] 03/19/2013, 11:37 AM Pager: [phone]

## 2013-03-19 NOTE — Progress Notes (Signed)
Feels fine. No further vomiting. No BMs. He ate his entire breakfast.  Hemoglobin stable at 9.0, INR normal at 1.5. BUN was not rechecked this morning.  Endoscopic findings reviewed, specifically, the absence of a source of his bleeding, and the presence of a Schatzki's ring which, on further questioning today, is asymptomatic in this patient, although he was informed of what symptoms to look for that might prompt a need for endoscopic dilatation of the ring.  Recommendation:  1. Please review my recommendations at the end of the operative report from yesterday  2. Okay for resumption of anticoagulation and discharged (from the GI tract standpoint) anytime you feel it is otherwise appropriate  3. The patient should followup with his primary physician and have monitoring of Hemoccults and hemoglobin as discussed at the end of his operative report from yesterday.  I will sign off at this time. Please call if I can be of further assistance with this patient.  Cleotis Nipper, M.D. [phone]

## 2013-03-20 ENCOUNTER — Encounter (HOSPITAL_COMMUNITY): Payer: Self-pay | Admitting: Gastroenterology

## 2013-03-27 ENCOUNTER — Telehealth: Payer: Self-pay | Admitting: Cardiovascular Disease

## 2013-03-27 NOTE — Telephone Encounter (Signed)
Please call him tomorrow regarding Alan [name] please.

## 2013-04-21 ENCOUNTER — Telehealth: Payer: Self-pay | Admitting: Pharmacist Clinician (PhC)/ Clinical Pharmacy Specialist

## 2013-04-21 NOTE — Telephone Encounter (Signed)
Pt received clearance to restart warfarin from GI.  Spoke with patient today, he will continue to have monitored by PCP.

## 2013-05-01 ENCOUNTER — Ambulatory Visit (INDEPENDENT_AMBULATORY_CARE_PROVIDER_SITE_OTHER): Payer: Medicare Other

## 2013-05-01 DIAGNOSIS — I455 Other specified heart block: Secondary | ICD-10-CM

## 2013-05-01 DIAGNOSIS — I442 Atrioventricular block, complete: Secondary | ICD-10-CM

## 2013-05-01 DIAGNOSIS — I4729 Other ventricular tachycardia: Secondary | ICD-10-CM

## 2013-05-01 DIAGNOSIS — I255 Ischemic cardiomyopathy: Secondary | ICD-10-CM

## 2013-05-01 DIAGNOSIS — I2589 Other forms of chronic ischemic heart disease: Secondary | ICD-10-CM

## 2013-05-01 DIAGNOSIS — I472 Ventricular tachycardia: Secondary | ICD-10-CM

## 2013-05-01 DIAGNOSIS — I4891 Unspecified atrial fibrillation: Secondary | ICD-10-CM

## 2013-05-01 LAB — PACEMAKER DEVICE OBSERVATION

## 2013-05-01 LAB — MDC_IDC_ENUM_SESS_TYPE_REMOTE
Battery Impedance: 111 Ohm
Brady Statistic AP VS Percent: 97.3 %
Brady Statistic AS VS Percent: 2.3 %
Lead Channel Pacing Threshold Amplitude: 0.5 V
Lead Channel Pacing Threshold Amplitude: 0.75 V
Lead Channel Pacing Threshold Pulse Width: 0.4 ms
Lead Channel Pacing Threshold Pulse Width: 0.4 ms
Lead Channel Setting Pacing Amplitude: 1.5 V
Lead Channel Setting Pacing Amplitude: 2 V
Lead Channel Setting Pacing Pulse Width: 0.4 ms
MDC IDC MSMT BATTERY VOLTAGE: 2.8 V
MDC IDC MSMT LEADCHNL RA IMPEDANCE VALUE: 436 Ohm
MDC IDC MSMT LEADCHNL RV IMPEDANCE VALUE: 799 Ohm
MDC IDC MSMT LEADCHNL RV SENSING INTR AMPL: 5.6 mV
MDC IDC SET LEADCHNL RV SENSING SENSITIVITY: 5.6 mV
MDC IDC STAT BRADY AP VP PERCENT: 0.4 %
MDC IDC STAT BRADY AS VP PERCENT: 0.1 % — AB

## 2013-05-09 ENCOUNTER — Encounter: Payer: Self-pay | Admitting: *Deleted

## 2013-05-29 NOTE — Telephone Encounter (Signed)
Closed encounter °

## 2013-06-04 ENCOUNTER — Telehealth: Payer: Self-pay | Admitting: Cardiovascular Disease

## 2013-06-05 ENCOUNTER — Encounter: Payer: Medicare Other | Admitting: Cardiovascular Disease

## 2013-06-13 NOTE — Telephone Encounter (Signed)
Closed encounter °

## 2013-06-18 ENCOUNTER — Encounter: Payer: Self-pay | Admitting: *Deleted

## 2013-06-20 ENCOUNTER — Encounter: Payer: Self-pay | Admitting: Cardiovascular Disease

## 2013-06-20 ENCOUNTER — Ambulatory Visit (INDEPENDENT_AMBULATORY_CARE_PROVIDER_SITE_OTHER): Payer: Medicare Other | Admitting: Cardiovascular Disease

## 2013-06-20 VITALS — BP 130/60 | HR 64 | Resp 16 | Ht 70.0 in | Wt 227.4 lb

## 2013-06-20 DIAGNOSIS — I4729 Other ventricular tachycardia: Secondary | ICD-10-CM

## 2013-06-20 DIAGNOSIS — I472 Ventricular tachycardia: Secondary | ICD-10-CM

## 2013-06-20 DIAGNOSIS — Z950 Presence of cardiac pacemaker: Secondary | ICD-10-CM

## 2013-06-20 DIAGNOSIS — I442 Atrioventricular block, complete: Secondary | ICD-10-CM

## 2013-06-20 DIAGNOSIS — E785 Hyperlipidemia, unspecified: Secondary | ICD-10-CM

## 2013-06-20 DIAGNOSIS — I2510 Atherosclerotic heart disease of native coronary artery without angina pectoris: Secondary | ICD-10-CM

## 2013-06-20 DIAGNOSIS — I255 Ischemic cardiomyopathy: Secondary | ICD-10-CM

## 2013-06-20 DIAGNOSIS — I2589 Other forms of chronic ischemic heart disease: Secondary | ICD-10-CM

## 2013-06-20 DIAGNOSIS — R0602 Shortness of breath: Secondary | ICD-10-CM

## 2013-06-20 DIAGNOSIS — I4891 Unspecified atrial fibrillation: Secondary | ICD-10-CM

## 2013-06-20 DIAGNOSIS — Z951 Presence of aortocoronary bypass graft: Secondary | ICD-10-CM

## 2013-06-20 LAB — PACEMAKER DEVICE OBSERVATION

## 2013-06-20 MED ORDER — CARVEDILOL 25 MG PO TABS: 25.0000 mg | ORAL_TABLET | Freq: Two times a day (BID) | ORAL | Status: DC

## 2013-06-20 NOTE — Patient Instructions (Signed)
Dr Sallyanne Kuster has ordered the following test(s) to be done: Your physician has requested that you have an echocardiogram. Echocardiography is a painless test that uses sound waves to create images of your heart. It provides your doctor with information about the size and shape of your heart and how well your heart's chambers and valves are working. This procedure takes approximately one hour. There are no restrictions for this procedure.  Your physician has recommended making the following medication changes:  INCREASE Carvedilol to 25 mg - take 1 tablet twice daily. A refill has already been sent to your pharmacy.  Dr Sallyanne Kuster wants you to follow-up in 1 month with a pacer check. You will receive a reminder letter in the mail one months in advance. If you don't receive a letter, please call our office to schedule the follow-up appointment.

## 2013-06-21 LAB — MDC_IDC_ENUM_SESS_TYPE_INCLINIC
Battery Impedance: 111 Ohm
Battery Remaining Longevity: 13
Battery Voltage: 2.8 V
Brady Statistic AP VP Percent: 0.4 %
Brady Statistic AS VP Percent: 0.1 % — CL
Brady Statistic AS VS Percent: 2.8 %
Lead Channel Impedance Value: 455 Ohm
Lead Channel Impedance Value: 807 Ohm
Lead Channel Pacing Threshold Pulse Width: 0.4 ms
Lead Channel Sensing Intrinsic Amplitude: 4 mV
Lead Channel Setting Pacing Amplitude: 1.5 V
Lead Channel Setting Pacing Amplitude: 2 V
Lead Channel Setting Pacing Pulse Width: 0.4 ms
MDC IDC MSMT LEADCHNL RA PACING THRESHOLD AMPLITUDE: 0.75 V
MDC IDC MSMT LEADCHNL RV PACING THRESHOLD AMPLITUDE: 0.5 V
MDC IDC MSMT LEADCHNL RV PACING THRESHOLD PULSEWIDTH: 0.4 ms
MDC IDC MSMT LEADCHNL RV SENSING INTR AMPL: 15.68 mV
MDC IDC SET LEADCHNL RV SENSING SENSITIVITY: 5.6 mV
MDC IDC STAT BRADY AP VS PERCENT: 96.9 %

## 2013-06-22 ENCOUNTER — Encounter: Payer: Self-pay | Admitting: Cardiovascular Disease

## 2013-06-22 NOTE — Assessment & Plan Note (Signed)
Statin intolerant - all agents have been tried. No recent labs available, but Dr. Melford Aase probably has checked them - will retrieve a copy.

## 2013-06-22 NOTE — Assessment & Plan Note (Signed)
No recent events. Had bleeding from uncertain location in GI tract when INR was >7.

## 2013-06-22 NOTE — Progress Notes (Signed)
Patient ID: Alan [name], male   DOB: April 13, 1940, 73 y.o.   MRN: [id number]     Reason for office visit Followup coronary disease, ischemic cardiomyopathy, nonsustained VT, paroxysmal atrial fib, complete heart block and pacemaker check  [name] feels well. In February his INR jumped to 7.0, he developed melena and he was found to be anemic. He received 2 units PRBC for a Hgb of 8.4. EGD was negative. He is back on anticoagulants. He did not have angina during that time. He has no abdominal complaints.  Last fall we had to gradually reduce many of his cardiac meds due to weakness and fatigue and low BP.  Device interrogation shows episodes of NSVT - these are consistently present on past checks, but are much longer on the current interrogation, up to 25 beats (slower episode lasting almost 13 seconds), shorter episode up to 219 bpm. 97% Ap, 0.5% V paced. No AFib seen.  He has a longstanding history of cardiac problems. He had CABG in 1990 (sequential LIMA to LAD and diagonal), multiple RCA PCI 1990-2004 and a redo CABG in 2004 (SVG-RCA-PDA, with surgical MAZE procedure, Dr. Amador Cunas). Cath 2007 and 2012 show occluded LAD and RCA with patent grafts, 60% ostial OM and diffuse stenoses distal LCX. He has mild ischemic CMP (EF 45-50%) without clinical CHF.  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 He had a generator changeout and ventricular lead revision in January 2014. He does not tolerate VVI pacing. He has had occasional nonsustained VT recorded by the device, always asymptomatic. He has had paroxysmal atrial fibrillation. No history of stroke or TIA.  He wears CPAP for OSA, has insulin requiring type II DM, mild dyslipidemia intolerant to all statins, post-polio syndrome, lumbar spine surgery 01/2012.  He does not trust nuclear stress tests - reports they were repeatedly wrong in the past. [name] told him he should always have a cath  rather than a stress test.   No Known Allergies  Current Outpatient Prescriptions  Medication Sig Dispense Refill  . amLODipine (NORVASC) 10 MG tablet Take 5 mg by mouth daily.      [name] Kitchen aspirin 81 MG tablet Take 81 mg by mouth daily.      . Chromium-Cinnamon (CINNAMON PLUS CHROMIUM PO) Take 1 tablet by mouth 2 (two) times daily.       . Flaxseed, Linseed, 1300 MG CAPS Take 1,300 mg by mouth 2 (two) times daily.       . furosemide (LASIX) 40 MG tablet Take 40 mg by mouth daily.      [name] Kitchen glipiZIDE (GLUCOTROL) 10 MG tablet Take 10 mg by mouth daily as needed (for high blood surgars). For high blood sugars      . insulin glargine (LANTUS) 100 UNIT/ML injection Inject 20 Units into the skin daily as needed (elevated blood sugars).       . metFORMIN (GLUCOPHAGE) 500 MG tablet Take 500 mg by mouth 4 (four) times daily.      . Multiple Vitamin (MULTIVITAMIN WITH MINERALS) TABS Take 1 tablet by mouth every morning.      . nitroGLYCERIN (NITROSTAT) 0.4 MG SL tablet Place 1 tablet (0.4 mg total) under the tongue every 5 (five) minutes as needed. For chest pain  25 tablet  11  . warfarin (COUMADIN) 5 MG tablet Take 5 mg by mouth daily.      . carvedilol (COREG) 25 MG tablet Take 1 tablet (  25 mg total) by mouth 2 (two) times daily.  180 tablet  3   No current facility-administered medications for this visit.    Past Medical History  Diagnosis Date  . Diabetes mellitus   . A-fib   . Complete heart block   . Coronary artery disease     seen at [facility] cardiology   . Hypertension     Dr. Veronia Beets, primary  . Sleep apnea     cpap, 11, last sleep study Jan2013, sees Dr. Keturah Barre  . Neuromuscular disorder     carpal tunnel bilaterally  . Polio     hx of  . Chronic back pain   . Pacemaker 03/07/2012    replaced  . Status post placement of cardiac pacemaker, 03/07/12 New Medtronic Gen and new Vent lead original placed 2008 03/08/2012  . Anticoagulant long-term use, on coumadin for PAF  03/08/2012  . Hx of echocardiogram 06/29/2008    EF 45-50%     Past Surgical History  Procedure Laterality Date  . Coronary artery bypass graft  1990  . Stents    . Coronary artery bypass graft  2004  . Maze procedure  2004  . Pacemaker insertion  08, 1/14  . Back surgery    . Cardiac catheterization    . Tonsillectomy    . Appendectomy    . Hernia repair    . Lumbar laminectomy/decompression microdiscectomy  01/10/2012    Procedure: LUMBAR LAMINECTOMY/DECOMPRESSION MICRODISCECTOMY 1 LEVEL;  Surgeon: [name], MD;  Location: [facility];  Service: Neurosurgery;  Laterality: Bilateral;  Lumbar three-four decompression, Posterior lateral fusion lumbar three-four, posterior spinus plate lumbar three-four  . Insert / replace / remove pacemaker  03/07/2012    replaced  . Joint replacement      Hx: of left knee  . Fracture surgery      Hx: of right leg  . Nasal septum surgery N/A 14  . Posterior fusion lumbar spine  08/28/2012    Dr Ronnald Ramp  . Esophagogastroduodenoscopy Left 03/18/2013    Procedure: ESOPHAGOGASTRODUODENOSCOPY (EGD);  Surgeon: Cleotis Nipper, MD;  Location: [facility] ENDOSCOPY;  Service: Endoscopy;  Laterality: Left;    Family History  Problem Relation Age of Onset  . Other Father     MVA  . Cancer Mother     History   Social History  . Marital Status: Single    Spouse Name: N/A    Number of Children: N/A  . Years of Education: N/A   Occupational History  . pilot-retired    Social History Main Topics  . Smoking status: Never Smoker   . Smokeless tobacco: Never Used  . Alcohol Use: No  . Drug Use: No  . Sexual Activity: Yes   Other Topics Concern  . Not on file   Social History Narrative   [place] [facility], fishes.     Review of systems: The patient specifically denies any chest pain at rest or with exertion, dyspnea at rest or with exertion, orthopnea, paroxysmal nocturnal dyspnea, syncope, palpitations, focal neurological  deficits, intermittent claudication, lower extremity edema, unexplained weight gain, cough, hemoptysis or wheezing.  The patient also denies abdominal pain, nausea, vomiting, dysphagia, diarrhea, constipation, polyuria, polydipsia, dysuria, hematuria, frequency, urgency, abnormal bleeding or bruising, fever, chills, unexpected weight changes, mood swings, change in skin or hair texture, change in voice quality, auditory or visual problems, allergic reactions or rashes, new musculoskeletal complaints other than usual "aches and  pains".   PHYSICAL EXAM BP 130/60  Pulse 64  Resp 16  Ht 5\' 10"  (1.778 m)  Wt 227 lb 6.4 oz (103.148 kg)  BMI 32.63 kg/m2  General: Alert, oriented x3, no distress Head: no evidence of trauma, PERRL, EOMI, no exophtalmos or lid lag, no myxedema, no xanthelasma; normal ears, nose and oropharynx Neck: normal jugular venous pulsations and no hepatojugular reflux; brisk carotid pulses without delay and no carotid bruits Chest: clear to auscultation, no signs of consolidation by percussion or palpation, normal fremitus, symmetrical and full respiratory excursions; sternotomy and pacemaker scars Cardiovascular: normal position and quality of the apical impulse, regular rhythm, normal first and second heart sounds, no murmurs, rubs or gallops Abdomen: no tenderness or distention, no masses by palpation, no abnormal pulsatility or arterial bruits, normal bowel sounds, no hepatosplenomegaly Extremities: no clubbing, cyanosis or edema; 2+ radial, ulnar and brachial pulses bilaterally; 2+ right femoral, posterior tibial and dorsalis pedis pulses; 2+ left femoral, posterior tibial and dorsalis pedis pulses; no subclavian or femoral bruits Neurological: grossly nonfocal   EKG: A paced, V sensed, tall R waves V1-V2 of 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, minor IVCD QRS 102 ms, QTC 451 ms  Lipid Panel     Component Value Date/Time   CHOL 134 12/20/2010 0903   TRIG 104 12/20/2010 0903   HDL 38*  12/20/2010 0903   CHOLHDL 3.5 12/20/2010 0903   VLDL 21 12/20/2010 0903   LDLCALC 75 12/20/2010 0903    BMET    Component Value Date/Time   NA 137 03/18/2013 0704   K 4.5 03/18/2013 0704   CL 103 03/18/2013 0704   CO2 22 03/18/2013 0704   GLUCOSE 183* 03/18/2013 0704   BUN 40* 03/18/2013 0704   CREATININE 1.42* 03/18/2013 0704   CALCIUM 8.6 03/18/2013 0704   GFRNONAA 48* 03/18/2013 0704   GFRAA 56* 03/18/2013 0704     ASSESSMENT AND PLAN Non-sustained ventricular tachycardia Increase carvedilol dose. Recheck LVEF. If EF is lower or long episodes persist on higher betablocker dose consider heart cath.  ATRIAL FIBRILLATION, PAROXYSMAL No recent events. Had bleeding from uncertain location in GI tract when INR was >7.  Ischemic cardiomyopathy NYHA class I, EF 45-50%, posterior scar by ECG.  CORONARY ARTERY BYPASS GRAFT, HX OF, in 1990 and Re-do CABG 2004 with Atrial Maze procedure,  No angina, but VT may be ischemia mediated (or scat VT from old infarction).  Dyslipidemia Statin intolerant - all agents have been tried. No recent labs available, but Dr. Melford Aase probably has checked them - will retrieve a copy.  Pacemaker - Medtronic Adapta dual chamber Normal device function. No permanent reprogramming was necessary. 97% A paced, <1% V paced.   Patient Instructions  Dr Sallyanne Kuster has ordered the following test(s) to be done: Your physician has requested that you have an echocardiogram. Echocardiography is a painless test that uses sound waves to create images of your heart. It provides your doctor with information about the size and shape of your heart and how well your heart's chambers and valves are working. This procedure takes approximately one hour. There are no restrictions for this procedure.  Your physician has recommended making the following medication changes:  INCREASE Carvedilol to 25 mg - take 1 tablet twice daily. A refill has already been sent to your pharmacy.  Dr  Sallyanne Kuster wants you to follow-up in 1 month with a pacer check. You will receive a reminder letter in the mail one months in advance. If you don't receive  a letter, please call our office to schedule the follow-up appointment.    Orders Placed This Encounter  Procedures  . Implantable device check  . EKG 12-Lead  . 2D Echocardiogram without contrast   Meds ordered this encounter  Medications  . amLODipine (NORVASC) 10 MG tablet    Sig: Take 5 mg by mouth daily.  [name] Kitchen aspirin 81 MG tablet    Sig: Take 81 mg by mouth daily.  . furosemide (LASIX) 40 MG tablet    Sig: Take 40 mg by mouth daily.  . carvedilol (COREG) 25 MG tablet    Sig: Take 1 tablet (25 mg total) by mouth 2 (two) times daily.    Dispense:  180 tablet    Refill:  3    Catheryne Deford  Sanda Klein, MD, [facility] [phone] office [phone] pager

## 2013-06-22 NOTE — Assessment & Plan Note (Signed)
Increase carvedilol dose. Recheck LVEF. If EF is lower or long episodes persist on higher betablocker dose consider heart cath.

## 2013-06-22 NOTE — Assessment & Plan Note (Signed)
No angina, but VT may be ischemia mediated (or scat VT from old infarction).

## 2013-06-22 NOTE — Assessment & Plan Note (Addendum)
NYHA class I, EF 45-50%, posterior scar by ECG.

## 2013-06-22 NOTE — Assessment & Plan Note (Signed)
Normal device function. No permanent reprogramming was necessary. 97% A paced, <1% V paced.

## 2013-06-25 ENCOUNTER — Ambulatory Visit (HOSPITAL_COMMUNITY)
Admission: RE | Admit: 2013-06-25 | Discharge: 2013-06-25 | Disposition: A | Discharge status: Home / Self Care | Payer: Medicare Other | Source: Ambulatory Visit | Attending: Cardiology | Admitting: Cardiology

## 2013-06-25 DIAGNOSIS — R0602 Shortness of breath: Secondary | ICD-10-CM

## 2013-06-25 DIAGNOSIS — I2510 Atherosclerotic heart disease of native coronary artery without angina pectoris: Secondary | ICD-10-CM

## 2013-06-25 DIAGNOSIS — I517 Cardiomegaly: Secondary | ICD-10-CM

## 2013-06-25 DIAGNOSIS — R0609 Other forms of dyspnea: Secondary | ICD-10-CM | POA: Insufficient documentation

## 2013-06-25 DIAGNOSIS — R0989 Other specified symptoms and signs involving the circulatory and respiratory systems: Principal | ICD-10-CM | POA: Insufficient documentation

## 2013-06-25 NOTE — Progress Notes (Signed)
2D Echocardiogram Complete.  06/25/2013   Brayan Votaw, RDCS 

## 2013-07-04 DIAGNOSIS — N1832 Chronic kidney disease, stage 3b: Secondary | ICD-10-CM | POA: Insufficient documentation

## 2013-07-04 DIAGNOSIS — N183 Chronic kidney disease, stage 3 unspecified: Secondary | ICD-10-CM | POA: Insufficient documentation

## 2013-07-04 DIAGNOSIS — D62 Acute posthemorrhagic anemia: Secondary | ICD-10-CM | POA: Insufficient documentation

## 2013-07-14 ENCOUNTER — Encounter: Payer: Self-pay | Admitting: Cardiovascular Disease

## 2013-07-14 ENCOUNTER — Ambulatory Visit (INDEPENDENT_AMBULATORY_CARE_PROVIDER_SITE_OTHER): Payer: Medicare Other | Admitting: *Deleted

## 2013-07-14 ENCOUNTER — Ambulatory Visit (INDEPENDENT_AMBULATORY_CARE_PROVIDER_SITE_OTHER): Payer: Medicare Other | Admitting: Cardiovascular Disease

## 2013-07-14 VITALS — BP 123/72 | HR 78 | Resp 16 | Ht 70.0 in | Wt 229.8 lb

## 2013-07-14 DIAGNOSIS — Z950 Presence of cardiac pacemaker: Secondary | ICD-10-CM

## 2013-07-14 DIAGNOSIS — I4891 Unspecified atrial fibrillation: Secondary | ICD-10-CM

## 2013-07-14 DIAGNOSIS — I442 Atrioventricular block, complete: Secondary | ICD-10-CM

## 2013-07-14 DIAGNOSIS — I4729 Other ventricular tachycardia: Secondary | ICD-10-CM

## 2013-07-14 DIAGNOSIS — I472 Ventricular tachycardia: Secondary | ICD-10-CM

## 2013-07-14 DIAGNOSIS — Z951 Presence of aortocoronary bypass graft: Secondary | ICD-10-CM

## 2013-07-14 DIAGNOSIS — E785 Hyperlipidemia, unspecified: Secondary | ICD-10-CM

## 2013-07-14 DIAGNOSIS — I455 Other specified heart block: Secondary | ICD-10-CM

## 2013-07-14 DIAGNOSIS — I10 Essential (primary) hypertension: Secondary | ICD-10-CM

## 2013-07-14 LAB — PACEMAKER DEVICE OBSERVATION

## 2013-07-14 NOTE — Patient Instructions (Signed)
Remote monitoring is used to monitor your Pacemaker or ICD from home. This monitoring reduces the number of office visits required to check your device to one time per year. It allows [place] to monitor the functioning of your device to ensure it is working properly. You are scheduled for a device check from home on August 14, 2013. You may send your transmission at any time that day. If you have a wireless device, the transmission will be sent automatically. After your physician reviews your transmission, you will receive a postcard with your next transmission date.  Dr. Sallyanne Kuster recommends that you schedule a follow-up appointment in: 2 months.

## 2013-07-15 LAB — MDC_IDC_ENUM_SESS_TYPE_INCLINIC
Battery Impedance: 111 Ohm
Battery Remaining Longevity: 13
Battery Voltage: 2.8 V
Brady Statistic AP VS Percent: 98.7 %
Brady Statistic AS VP Percent: 0.1 % — CL
Brady Statistic AS VS Percent: 0.6 %
Lead Channel Impedance Value: 442 Ohm
Lead Channel Setting Sensing Sensitivity: 5.6 mV
MDC IDC MSMT LEADCHNL RV IMPEDANCE VALUE: 727 Ohm
MDC IDC SET LEADCHNL RA PACING AMPLITUDE: 1.5 V
MDC IDC SET LEADCHNL RV PACING AMPLITUDE: 2 V
MDC IDC SET LEADCHNL RV PACING PULSEWIDTH: 0.4 ms
MDC IDC STAT BRADY AP VP PERCENT: 0.6 %

## 2013-07-15 NOTE — Progress Notes (Signed)
PPM interrogation for AMS episodes only. 2 AMS episodes recorded---both <30 sec, no EGMs. No ventricular high rate episodes. Patient to follow up with [facility] in 09-2013. No changes made this session. No tests performed.

## 2013-07-20 ENCOUNTER — Encounter: Payer: Self-pay | Admitting: Cardiovascular Disease

## 2013-07-20 NOTE — Progress Notes (Signed)
Patient ID: Alan [name], male   DOB: [birth year], 73 y.o.   MRN: [id number]      Reason for office visit CAD, ischemic cardiomyopathy, nonsustained VT, paroxysmal atrial fib, complete heart block  Which had another episode of gastrointestinal bleeding requiring transfusion. In February he had melena in the setting of supratherapeutic anticoagulation (INR 7.0) he received 2 units of blood transfusion then his endoscopic workup was negative (including capsule enteroscopy). Anticoagulation was restarted. On May 29 he was seen at [facility]. He presented again with melena, at this time his INR was only 3.3. He received 2 units of packed red blood cells as well as intravenous iron and vitamin K. By the time of discharge his INR was 1.4 and his hemoglobin was 8.7.  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 Rosana Berger.  He has a longstanding history of cardiac problems. He had CABG in 1990 (sequential LIMA to LAD and diagonal), multiple RCA PCI 1990-2004 and a redo CABG in 2004 (SVG-RCA-PDA, with surgical MAZE procedure, Dr. Amador Cunas). Cath 2007 and 2012 show occluded LAD and RCA with patent grafts, 60% ostial OM and diffuse stenoses distal LCX. 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 He had a generator changeout and ventricular lead revision in January 2014. He does not tolerate VVI pacing. He has had occasional nonsustained VT recorded by the device, always asymptomatic. He has had paroxysmal atrial fibrillation. No history of stroke or TIA.  He wears CPAP for OSA, has insulin requiring type II DM, mild dyslipidemia intolerant to all statins, post-polio syndrome, lumbar spine surgery  01/2012.  He does not trust nuclear stress tests - reports they were repeatedly wrong in the past. [name] told him he should always have a cath rather than a stress test.  The patient specifically denies any chest pain at rest or with exertion, dyspnea at rest or with exertion, orthopnea, paroxysmal nocturnal dyspnea, syncope, palpitations, focal neurological deficits, intermittent claudication, lower extremity edema, unexplained weight gain, cough, hemoptysis or wheezing.  The patient also denies abdominal pain, nausea, vomiting, dysphagia, diarrhea, constipation, polyuria, polydipsia, dysuria, hematuria, frequency, urgency, abnormal bleeding or bruising, fever, chills, unexpected weight changes, mood swings, change in skin or hair texture, change in voice quality, auditory or visual problems, allergic reactions or rashes, new musculoskeletal complaints other than usual "aches and pains".  No Known Allergies  Current Outpatient Prescriptions  Medication Sig Dispense Refill  . amLODipine (NORVASC) 10 MG tablet Take 5 mg by mouth daily.      [name] Kitchen aspirin 81 MG tablet Take 81 mg by mouth daily.      . carvedilol (COREG) 25 MG tablet Take 50 mg by mouth 2 (two) times daily.      . Chromium-Cinnamon (CINNAMON PLUS CHROMIUM PO) Take 1 tablet by mouth 2 (two) times daily.       . Flaxseed, Linseed, 1300 MG CAPS Take 1,300 mg by mouth 2 (two) times daily.       . furosemide (LASIX) 40 MG tablet Take 40 mg by mouth daily.      [name] Kitchen glipiZIDE (GLUCOTROL) 10 MG tablet Take 10 mg by mouth daily as needed (for high blood surgars).  For high blood sugars      . insulin glargine (LANTUS) 100 UNIT/ML injection Inject 20 Units into the skin daily as needed (elevated blood sugars).       [name] Kitchen lisinopril-hydrochlorothiazide (PRINZIDE,ZESTORETIC) 20-12.5 MG per tablet Take 0.5 tablets by mouth daily.      . metFORMIN (GLUCOPHAGE) 500 MG tablet Take 500 mg by mouth 4 (four) times daily.      . Multiple Vitamin  (MULTIVITAMIN WITH MINERALS) TABS Take 1 tablet by mouth every morning.      . nitroGLYCERIN (NITROSTAT) 0.4 MG SL tablet Place 1 tablet (0.4 mg total) under the tongue every 5 (five) minutes as needed. For chest pain  25 tablet  11  . warfarin (COUMADIN) 5 MG tablet Take 5 mg by mouth daily.       No current facility-administered medications for this visit.    Past Medical History  Diagnosis Date  . Diabetes mellitus   . A-fib   . Complete heart block   . Coronary artery disease     seen at [facility] cardiology   . Hypertension     Dr. Veronia Beets, primary  . Sleep apnea     cpap, 11, last sleep study Jan2013, sees Dr. Keturah Barre  . Neuromuscular disorder     carpal tunnel bilaterally  . Polio     hx of  . Chronic back pain   . Pacemaker 03/07/2012    replaced  . Status post placement of cardiac pacemaker, 03/07/12 New Medtronic Gen and new Vent lead original placed 2008 03/08/2012  . Anticoagulant long-term use, on coumadin for PAF 03/08/2012  . Hx of echocardiogram 06/29/2008    EF 45-50%     Past Surgical History  Procedure Laterality Date  . Coronary artery bypass graft  1990  . Stents    . Coronary artery bypass graft  2004  . Maze procedure  2004  . Pacemaker insertion  08, 1/14  . Back surgery    . Cardiac catheterization    . Tonsillectomy    . Appendectomy    . Hernia repair    . Lumbar laminectomy/decompression microdiscectomy  01/10/2012    Procedure: LUMBAR LAMINECTOMY/DECOMPRESSION MICRODISCECTOMY 1 LEVEL;  Surgeon: [name], MD;  Location: [place];  Service: Neurosurgery;  Laterality: Bilateral;  Lumbar three-four decompression, Posterior lateral fusion lumbar three-four, posterior spinus plate lumbar three-four  . Insert / replace / remove pacemaker  03/07/2012    replaced  . Joint replacement      Hx: of left knee  . Fracture surgery      Hx: of right leg  . Nasal septum surgery N/A 14  . Posterior fusion lumbar spine  08/28/2012    Dr  Ronnald Ramp  . Esophagogastroduodenoscopy Left 03/18/2013    Procedure: ESOPHAGOGASTRODUODENOSCOPY (EGD);  Surgeon: Cleotis Nipper, MD;  Location[facility] END[facility]COPY;  Service: Endoscopy;  Laterality: Left;    Family History  Problem Relation Age of Onset  . Other Father     MVA  . Cancer Mother     History   Social History  . Marital Status: Single    Spouse Name: N/A    Number of Children: N/A  . Years of Education: N/A   Occupational History  . pilot-retired    Social History Main Topics  . Smoking status: Never Smoker   . Smokeless tobacco: Never Used  . Alcohol Use: No  . Drug Use: No  . Sexual Activity: Yes   Other  Topics Concern  . Not on file   Social History Narrative   [place] [facility], fishes.     Review of systems: The patient specifically denies any chest pain at rest or with exertion, dyspnea at rest or with exertion, orthopnea, paroxysmal nocturnal dyspnea, syncope, palpitations, focal neurological deficits, intermittent claudication, lower extremity edema, unexplained weight gain, cough, hemoptysis or wheezing.  The patient also denies abdominal pain, nausea, vomiting, dysphagia, diarrhea, constipation, polyuria, polydipsia, dysuria, hematuria, frequency, urgency, abnormal bleeding or bruising, fever, chills, unexpected weight changes, mood swings, change in skin or hair texture, change in voice quality, auditory or visual problems, allergic reactions or rashes, new musculoskeletal complaints other than usual "aches and pains".   PHYSICAL EXAM BP 123/72  Pulse 78  Resp 16  Ht 5\' 10"  (1.778 m)  Wt 229 lb 12.8 oz (104.237 kg)  BMI 32.97 kg/m2 General: Alert, oriented x3, no distress  Head: no evidence of trauma, PERRL, EOMI, no exophtalmos or lid lag, no myxedema, no xanthelasma; normal ears, nose and oropharynx  Neck: normal jugular venous pulsations and no hepatojugular reflux; brisk carotid pulses without delay and no carotid bruits    Chest: clear to auscultation, no signs of consolidation by percussion or palpation, normal fremitus, symmetrical and full respiratory excursions; sternotomy and pacemaker scars  Cardiovascular: normal position and quality of the apical impulse, regular rhythm, normal first and second heart sounds, no murmurs, rubs or gallops  Abdomen: no tenderness or distention, no masses by palpation, no abnormal pulsatility or arterial bruits, normal bowel sounds, no hepatosplenomegaly  Extremities: no clubbing, cyanosis or edema; 2+ radial, ulnar and brachial pulses bilaterally; 2+ right femoral, posterior tibial and dorsalis pedis pulses; 2+ left femoral, posterior tibial and dorsalis pedis pulses; no subclavian or femoral bruits  Neurological: grossly nonfocal  Lipid Panel     Component Value Date/Time   CHOL 134 12/20/2010 0903   TRIG 104 12/20/2010 0903   HDL 38* 12/20/2010 0903   CHOLHDL 3.5 12/20/2010 0903   VLDL 21 12/20/2010 0903   LDLCALC 75 12/20/2010 0903    BMET    Component Value Date/Time   NA 137 03/18/2013 0704   K 4.5 03/18/2013 0704   CL 103 03/18/2013 0704   CO2 22 03/18/2013 0704   GLUCOSE 183* 03/18/2013 0704   BUN 40* 03/18/2013 0704   CREATININE 1.42* 03/18/2013 0704   CALCIUM 8.6 03/18/2013 0704   GFRNONAA 48* 03/18/2013 0704   GFRAA 56* 03/18/2013 0704     ASSESSMENT AND PLAN  Recurrent GI bleeding  We'll try to obtain more detailed records regarding the endoscopic finding. I agree that for the time being warfarin anticoagulation is too risky and will be avoided. His pacemaker is able to detect recurrent atrial fibrillation but unfortunately will not automatically report this. We'll increase the frequency of pacemaker downloads to monthly just for arrhythmia. Non-sustained ventricular tachycardia  Asymptomatic. ATRIAL FIBRILLATION, PAROXYSMAL  No recent events. Stop warfarin due to GI bleeding. CHADS Vasc score is 3, HAS-BLED score is 4. Ischemic cardiomyopathy   Previously reported to have mildly decreased EF but the echo Jun 25, 2013 showed normal LVEF. This makes it less likely that his arrhythmia is clinically important. CORONARY ARTERY BYPASS GRAFT, HX OF, in 1990 and Re-do CABG 2004 with Atrial Maze procedure,  No angina or CHF  Dyslipidemia  Statin intolerant  Pacemaker - Medtronic Adapta dual chamber  Monthly downloads for AFib in the next several months at least  Patient Instructions  Remote monitoring is used to monitor your Pacemaker or ICD from home. This monitoring reduces the number of office visits required to check your device to one time per year. It allows [place] to monitor the functioning of your device to ensure it is working properly. You are scheduled for a device check from home on August 14, 2013. You may send your transmission at any time that day. If you have a wireless device, the transmission will be sent automatically. After your physician reviews your transmission, you will receive a postcard with your next transmission date.  Dr. Sallyanne Kuster recommends that you schedule a follow-up appointment in: 2 months.        Meds ordered this encounter  Medications  . lisinopril-hydrochlorothiazide (PRINZIDE,ZESTORETIC) 20-12.5 MG per tablet    Sig: Take 0.5 tablets by mouth daily.  . carvedilol (COREG) 25 MG tablet    Sig: Take 50 mg by mouth 2 (two) times daily.    Holli Humbles, MD, [place] [phone] office [phone] pager

## 2013-07-26 ENCOUNTER — Encounter: Payer: Self-pay | Admitting: Cardiovascular Disease

## 2013-07-26 ENCOUNTER — Telehealth: Payer: Self-pay | Admitting: Cardiovascular Disease

## 2013-08-11 NOTE — Telephone Encounter (Signed)
Closed enocounters

## 2013-08-14 ENCOUNTER — Encounter: Payer: Medicare Other | Admitting: *Deleted

## 2013-08-14 ENCOUNTER — Telehealth: Payer: Self-pay | Admitting: Cardiology

## 2013-08-14 ENCOUNTER — Telehealth: Payer: Self-pay | Admitting: Cardiovascular Disease

## 2013-08-14 NOTE — Telephone Encounter (Signed)
Spoke with pt and reminded pt of remote transmission that is due today. Pt verbalized understanding.   

## 2013-08-14 NOTE — Telephone Encounter (Signed)
New message    Having trouble with remote transmitting

## 2013-08-14 NOTE — Telephone Encounter (Signed)
Spoke with pt and gave pt tech support number to call for help with trouble shooting monitor.

## 2013-08-15 ENCOUNTER — Encounter: Payer: Self-pay | Admitting: Cardiology

## 2013-08-21 ENCOUNTER — Ambulatory Visit: Payer: Medicare Other | Admitting: Cardiovascular Disease

## 2013-09-17 ENCOUNTER — Encounter: Payer: Medicare Other | Admitting: Cardiovascular Disease

## 2013-10-07 ENCOUNTER — Ambulatory Visit (INDEPENDENT_AMBULATORY_CARE_PROVIDER_SITE_OTHER): Payer: Medicare Other | Admitting: Cardiovascular Disease

## 2013-10-07 ENCOUNTER — Encounter: Payer: Self-pay | Admitting: Cardiovascular Disease

## 2013-10-07 VITALS — BP 152/78 | HR 83 | Ht 70.0 in | Wt 228.2 lb

## 2013-10-07 DIAGNOSIS — I255 Ischemic cardiomyopathy: Secondary | ICD-10-CM

## 2013-10-07 DIAGNOSIS — I495 Sick sinus syndrome: Secondary | ICD-10-CM

## 2013-10-07 DIAGNOSIS — I4891 Unspecified atrial fibrillation: Secondary | ICD-10-CM

## 2013-10-07 DIAGNOSIS — Z951 Presence of aortocoronary bypass graft: Secondary | ICD-10-CM

## 2013-10-07 DIAGNOSIS — I2589 Other forms of chronic ischemic heart disease: Secondary | ICD-10-CM

## 2013-10-07 DIAGNOSIS — I472 Ventricular tachycardia: Secondary | ICD-10-CM

## 2013-10-07 DIAGNOSIS — I4729 Other ventricular tachycardia: Secondary | ICD-10-CM

## 2013-10-07 DIAGNOSIS — Z950 Presence of cardiac pacemaker: Secondary | ICD-10-CM

## 2013-10-07 DIAGNOSIS — I480 Paroxysmal atrial fibrillation: Secondary | ICD-10-CM

## 2013-10-07 DIAGNOSIS — I455 Other specified heart block: Secondary | ICD-10-CM

## 2013-10-07 LAB — MDC_IDC_ENUM_SESS_TYPE_INCLINIC
Battery Impedance: 135 Ohm
Battery Voltage: 2.79 V
Brady Statistic AP VS Percent: 97 %
Brady Statistic AS VP Percent: 0 %
Brady Statistic AS VS Percent: 1 %
Date Time Interrogation Session: 20150901131215
Lead Channel Impedance Value: 850 Ohm
Lead Channel Pacing Threshold Amplitude: 0.75 V
Lead Channel Pacing Threshold Pulse Width: 0.4 ms
Lead Channel Pacing Threshold Pulse Width: 0.4 ms
Lead Channel Sensing Intrinsic Amplitude: 22.4 mV
Lead Channel Setting Pacing Amplitude: 2 V
Lead Channel Setting Pacing Pulse Width: 0.4 ms
MDC IDC MSMT BATTERY REMAINING LONGEVITY: 150 mo
MDC IDC MSMT LEADCHNL RA IMPEDANCE VALUE: 510 Ohm
MDC IDC MSMT LEADCHNL RV PACING THRESHOLD AMPLITUDE: 0.5 V
MDC IDC SET LEADCHNL RA PACING AMPLITUDE: 1.5 V
MDC IDC SET LEADCHNL RV SENSING SENSITIVITY: 5.6 mV
MDC IDC STAT BRADY AP VP PERCENT: 1 %

## 2013-10-07 MED ORDER — LISINOPRIL-HYDROCHLOROTHIAZIDE 20-25 MG PO TABS: 1.0000 | ORAL_TABLET | Freq: Every day | ORAL | Status: DC

## 2013-10-07 MED ORDER — CARVEDILOL 25 MG PO TABS: 50.0000 mg | ORAL_TABLET | Freq: Two times a day (BID) | ORAL | Status: DC

## 2013-10-07 NOTE — Progress Notes (Signed)
Patient ID: Alan [name], male   DOB: Jun 16, 1940, 73 y.o.   MRN: [id number]     Reason for office visit Pacemaker check, paroxysmal atrial fibrillation, CAD s/p CABG  Despite stopping warfarin anticoagulation, Alan [name] had another episode of GI bleeding requiring transfusion in April. He was seen at [facility] and underwent repeat endoscopy (endoscopic workup in February was negative). He tells me they found "2 small holes in his stomach" which were cauterized. He has not had any bleeding events since. He remains off anti-coagulation. Interrogation of his pacemaker shows no episodes of atrial fibrillation in the last 6 months.  He has a longstanding history of cardiac problems. He had CABG in 1990 (sequential LIMA to LAD and diagonal), multiple RCA PCI 1990-2004 and a redo CABG in 2004 (SVG-RCA-PDA, with surgical MAZE procedure, Dr. Amador Cunas). Cath 2007 and 2012 show occluded LAD and RCA with patent grafts, 60% ostial OM and diffuse stenoses distal LCX. 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 He had a generator changeout and ventricular lead revision in January 2014. He does not tolerate VVI pacing. He has had occasional nonsustained VT recorded by the device, always asymptomatic. He has had paroxysmal atrial fibrillation. No history of stroke or TIA. Warfarin was stopped for recurrent unexplained GI bleeding. He wears CPAP for OSA, has insulin requiring type II DM, mild dyslipidemia intolerant to all statins, post-polio syndrome, lumbar spine surgery 01/2012.  He does not trust nuclear stress tests - reports they were repeatedly wrong in the past. [name] told him he should always have a cath rather than a stress test.   He does not have any cardiac complaints. Interrogation of his pacemaker today shows normal function.  No Known  Allergies  Current Outpatient Prescriptions  Medication Sig Dispense Refill  . aspirin 81 MG tablet Take 81 mg by mouth daily.      . carvedilol (COREG) 25 MG tablet Take 2 tablets (50 mg total) by mouth 2 (two) times daily.  360 tablet  3  . Chromium-Cinnamon (CINNAMON PLUS CHROMIUM PO) Take 1 tablet by mouth 2 (two) times daily.       . Flaxseed, Linseed, 1300 MG CAPS Take 1,300 mg by mouth 2 (two) times daily.       . furosemide (LASIX) 40 MG tablet Take 40 mg by mouth daily.      [name] Kitchen glipiZIDE (GLUCOTROL) 10 MG tablet Take 10 mg by mouth daily as needed (for high blood surgars). For high blood sugars      . insulin glargine (LANTUS) 100 UNIT/ML injection Inject 20 Units into the skin daily as needed (elevated blood sugars).       . metFORMIN (GLUCOPHAGE) 500 MG tablet Take 500 mg by mouth 4 (four) times daily.      . Multiple Vitamin (MULTIVITAMIN WITH MINERALS) TABS Take 1 tablet by mouth every morning.      . nitroGLYCERIN (NITROSTAT) 0.4 MG SL tablet Place 1 tablet (0.4 mg total) under the tongue every 5 (five) minutes as needed. For chest pain  25 tablet  11  . lisinopril-hydrochlorothiazide (PRINZIDE,ZESTORETIC) 20-25 MG per tablet Take 1 tablet by mouth daily.  90 tablet  3   No current facility-administered medications for this visit.    Past Medical History  Diagnosis Date  . Diabetes mellitus   .  A-fib   . Complete heart block   . Coronary artery disease     seen at [facility] cardiology   . Hypertension     Dr. Veronia Beets, primary  . Sleep apnea     cpap, 11, last sleep study Jan2013, sees Dr. Keturah Barre  . Neuromuscular disorder     carpal tunnel bilaterally  . Polio     hx of  . Chronic back pain   . Pacemaker 03/07/2012    replaced  . Status post placement of cardiac pacemaker, 03/07/12 New Medtronic Gen and new Vent lead original placed 2008 03/08/2012  . Anticoagulant long-term use, on coumadin for PAF 03/08/2012  . Hx of echocardiogram 06/29/2008    EF  45-50%     Past Surgical History  Procedure Laterality Date  . Coronary artery bypass graft  1990  . Stents    . Coronary artery bypass graft  2004  . Maze procedure  2004  . Pacemaker insertion  08, 1/14  . Back surgery    . Cardiac catheterization    . Tonsillectomy    . Appendectomy    . Hernia repair    . Lumbar laminectomy/decompression microdiscectomy  01/10/2012    Procedure: LUMBAR LAMINECTOMY/DECOMPRESSION MICRODISCECTOMY 1 LEVEL;  Surgeon: [name], MD;  Location: [place] [facility];  Service: Neurosurgery;  Laterality: Bilateral;  Lumbar three-four decompression, Posterior lateral fusion lumbar three-four, posterior spinus plate lumbar three-four  . Insert / replace / remove pacemaker  03/07/2012    replaced  . Joint replacement      Hx: of left knee  . Fracture surgery      Hx: of right leg  . Nasal septum surgery N/A 14  . Posterior fusion lumbar spine  08/28/2012    Dr Ronnald Ramp  . Esophagogastroduodenoscopy Left 03/18/2013    Procedure: ESOPHAGOGASTRODUODENOSCOPY (EGD);  Surgeon: Cleotis Nipper, MD;  Location: [facility] ENDOSCOPY;  Service: Endoscopy;  Laterality: Left;    Family History  Problem Relation Age of Onset  . Other Father     MVA  . Cancer Mother     History   Social History  . Marital Status: Single    Spouse Name: N/A    Number of Children: N/A  . Years of Education: N/A   Occupational History  . pilot-retired    Social History Main Topics  . Smoking status: Never Smoker   . Smokeless tobacco: Never Used  . Alcohol Use: No  . Drug Use: No  . Sexual Activity: Yes   Other Topics Concern  . Not on file   Social History Narrative   [place] [facility], fishes.     Review of systems: The patient specifically denies any chest pain at rest or with exertion, dyspnea at rest or with exertion, orthopnea, paroxysmal nocturnal dyspnea, syncope, palpitations, focal neurological deficits, intermittent claudication, lower extremity  edema, unexplained weight gain, cough, hemoptysis or wheezing.  The patient also denies abdominal pain, nausea, vomiting, dysphagia, diarrhea, constipation, polyuria, polydipsia, dysuria, hematuria, frequency, urgency, abnormal bleeding or bruising, fever, chills, unexpected weight changes, mood swings, change in skin or hair texture, change in voice quality, auditory or visual problems, allergic reactions or rashes, new musculoskeletal complaints other than usual "aches and pains".   PHYSICAL EXAM BP 152/78  Pulse 83  Ht 5\' 10"  (1.778 m)  Wt 228 lb 3.2 oz (103.511 kg)  BMI 32.74 kg/m2 General: Alert, oriented x3, no distress  Head: no evidence of  trauma, PERRL, EOMI, no exophtalmos or lid lag, no myxedema, no xanthelasma; normal ears, nose and oropharynx  Neck: normal jugular venous pulsations and no hepatojugular reflux; brisk carotid pulses without delay and no carotid bruits  Chest: clear to auscultation, no signs of consolidation by percussion or palpation, normal fremitus, symmetrical and full respiratory excursions; sternotomy and pacemaker scars  Cardiovascular: normal position and quality of the apical impulse, regular rhythm, normal first and second heart sounds, no murmurs, rubs or gallops  Abdomen: no tenderness or distention, no masses by palpation, no abnormal pulsatility or arterial bruits, normal bowel sounds, no hepatosplenomegaly  Extremities: Asymmetrical leg atrophy due to childhood polio, no clubbing, cyanosis or edema; 2+ radial, ulnar and brachial pulses bilaterally; 2+ right femoral, posterior tibial and dorsalis pedis pulses; 2+ left femoral, posterior tibial and dorsalis pedis pulses; no subclavian or femoral bruits  Neurological: grossly nonfocal  Lipid Panel     Component Value Date/Time   CHOL 134 12/20/2010 0903   TRIG 104 12/20/2010 0903   HDL 38* 12/20/2010 0903   CHOLHDL 3.5 12/20/2010 0903   VLDL 21 12/20/2010 0903   LDLCALC 75 12/20/2010 0903     BMET    Component Value Date/Time   NA 137 03/18/2013 0704   K 4.5 03/18/2013 0704   CL 103 03/18/2013 0704   CO2 22 03/18/2013 0704   GLUCOSE 183* 03/18/2013 0704   BUN 40* 03/18/2013 0704   CREATININE 1.42* 03/18/2013 0704   CALCIUM 8.6 03/18/2013 0704   GFRNONAA 48* 03/18/2013 0704   GFRAA 56* 03/18/2013 0704     ASSESSMENT AND PLAN Recurrent GI bleeding  We'll try to obtain more detailed records regarding the endoscopic finding at [facility]. It is important to note that his GI bleeding occurred even when he was. Hopefully the recent endoscopic procedure was curative, but I am still reluctant to restart anticoagulants since has required so many transfusions recently..   Non-sustained ventricular tachycardia  Asymptomatic. Similar to previous pacemaker checks he has infrequent episodes of nonsustained VT, up to 16 beats in duration.  ATRIAL FIBRILLATION, PAROXYSMAL  No recent events. No history of stroke/TIA. CHADS Vasc score is 3, HAS-BLED score is 4. Continue to monitor for arrhythmia recurrence. Pacemaker check  Ischemic cardiomyopathy  Previously reported to have mildly decreased EF but the echo Jun 25, 2013 showed normal LVEF.   CORONARY ARTERY BYPASS GRAFT, HX OF, in 1990 and Re-do CABG 2004 with Atrial Maze procedure,  No angina or CHF   Dyslipidemia  Statin intolerant   Pacemaker - Medtronic Adapta dual chamber  No meaningful arrhythmia has been recorded. Normal lead and battery parameters. 98% atrial pacing, 1% ventricular pacing  Hypertension He wants to simplify his medication list. Discontinue amlodipine. Increase lisinopril/hydrochlorothiazide to a full tablet daily.  Orders Placed This Encounter  Procedures  . Implantable device check   Meds ordered this encounter  Medications  . DISCONTD: lisinopril-hydrochlorothiazide (PRINZIDE,ZESTORETIC) 20-25 MG per tablet    Sig: Take 0.5 tablets by mouth daily.  [name] Kitchen lisinopril-hydrochlorothiazide (PRINZIDE,ZESTORETIC)  20-25 MG per tablet    Sig: Take 1 tablet by mouth daily.    Dispense:  90 tablet    Refill:  3  . carvedilol (COREG) 25 MG tablet    Sig: Take 2 tablets (50 mg total) by mouth 2 (two) times daily.    Dispense:  360 tablet    Refill:  [place] Goodwin Kamphaus, MD, [facility] [phone] office [phone] pager

## 2013-10-07 NOTE — Patient Instructions (Addendum)
STOP Amlodipine.  INCREASE Lisinopril HCTZ 20/25mg  to 1 tablet daily.  A new RX has been sent to your pharmacy for Carvedilol #360.  Your physician recommends that you schedule a follow-up appointment in: 3 months with Dr.[name]

## 2013-11-03 ENCOUNTER — Other Ambulatory Visit: Payer: Self-pay | Admitting: Cardiovascular Disease

## 2013-11-03 NOTE — Telephone Encounter (Signed)
Rx was sent to pharmacy electronically. 

## 2013-11-05 ENCOUNTER — Encounter: Payer: Self-pay | Admitting: Cardiovascular Disease

## 2014-01-06 ENCOUNTER — Encounter: Payer: Self-pay | Admitting: Cardiovascular Disease

## 2014-01-06 ENCOUNTER — Ambulatory Visit (INDEPENDENT_AMBULATORY_CARE_PROVIDER_SITE_OTHER): Payer: Medicare Other | Admitting: Cardiovascular Disease

## 2014-01-06 VITALS — BP 150/86 | HR 75 | Ht 70.0 in | Wt 230.1 lb

## 2014-01-06 DIAGNOSIS — I455 Other specified heart block: Secondary | ICD-10-CM

## 2014-01-06 DIAGNOSIS — Z951 Presence of aortocoronary bypass graft: Secondary | ICD-10-CM

## 2014-01-06 DIAGNOSIS — I4729 Other ventricular tachycardia: Secondary | ICD-10-CM

## 2014-01-06 DIAGNOSIS — I472 Ventricular tachycardia: Secondary | ICD-10-CM

## 2014-01-06 DIAGNOSIS — I25810 Atherosclerosis of coronary artery bypass graft(s) without angina pectoris: Secondary | ICD-10-CM

## 2014-01-06 DIAGNOSIS — I4891 Unspecified atrial fibrillation: Secondary | ICD-10-CM

## 2014-01-06 LAB — MDC_IDC_ENUM_SESS_TYPE_INCLINIC
Battery Impedance: 135 Ohm
Battery Remaining Longevity: 149 mo
Battery Voltage: 2.79 V
Brady Statistic AP VP Percent: 0 %
Brady Statistic AP VS Percent: 99 %
Brady Statistic AS VP Percent: 0 %
Brady Statistic AS VS Percent: 0 %
Date Time Interrogation Session: 20151201142149
Lead Channel Impedance Value: 481 Ohm
Lead Channel Impedance Value: 794 Ohm
Lead Channel Pacing Threshold Amplitude: 0.5 V
Lead Channel Pacing Threshold Pulse Width: 0.4 ms
Lead Channel Pacing Threshold Pulse Width: 0.4 ms
Lead Channel Sensing Intrinsic Amplitude: 22.4 mV
Lead Channel Setting Pacing Amplitude: 1.5 V
Lead Channel Setting Pacing Amplitude: 2 V
Lead Channel Setting Pacing Pulse Width: 0.4 ms
Lead Channel Setting Sensing Sensitivity: 5.6 mV
MDC IDC MSMT LEADCHNL RA PACING THRESHOLD AMPLITUDE: 0.75 V

## 2014-01-06 NOTE — Progress Notes (Signed)
Patient ID: Alan [name], male   DOB: [birth year], 73 y.o.   MRN: [id number]      Reason for office visit Pacemaker check, paroxysmal atrial fibrillation, CAD s/p CABG   Alan [name] is here for a pacemaker check  He has a longstanding history of cardiac problems. He had CABG in 1990 (sequential LIMA to LAD and diagonal), multiple RCA PCI 1990-2004 and a redo CABG in 2004 (SVG-RCA-PDA, with surgical MAZE procedure, Dr. Amador Cunas). Cath 2007 and 2012 show occluded LAD and RCA with patent grafts, 60% ostial OM and diffuse stenoses distal LCX. 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 He had a generator changeout and ventricular lead revision in January 2014. He does not tolerate VVI pacing. He has had occasional nonsustained VT recorded by the device, always asymptomatic. He has had paroxysmal atrial fibrillation. No history of stroke or TIA. Warfarin was stopped for recurrent unexplained GI bleeding. He wears CPAP for OSA, has insulin requiring type II DM, mild dyslipidemia intolerant to all statins, post-polio syndrome, lumbar spine surgery 01/2012.  He does not trust nuclear stress tests - reports they were repeatedly wrong in the past. [name] told him he should always have a cath rather than a stress test.  He has had recurrent problems with upper gastrointestinal bleeding. Despite stopping warfarin anticoagulation, he had another episode of GI bleeding requiring transfusion in April 2015. He was seen at [facility] and underwent repeat endoscopy (endoscopic workup in February was negative). He tells me they found "2 small holes in his stomach" which were cauterized. He has not had any bleeding events since. He remains off anti-coagulation.   Interrogation of his pacemaker shows no episodes of atrial fibrillation in the last 9  months.  He does not have any cardiac complaints. Interrogation of his pacemaker today shows normal function.  He has  Complete absence of electrical activity in the atrium in the absence of pacing and has 100% atrial pacing. He only requires ventricular pacing 0.3% at the time and has intact A-V conduction. As on previous device checks he has occasional episodes of nonsustained ventricular tachycardia which are uniformly asymptomatic. The longest one lasted for 19 beats at 170 bpm and occurred on November 1. He is on beta blocker therapy.  His blood pressure slightly elevated today but he checks it frequently at home and it is typically just over 100 mmHg. He has no cardiac complaints.    No Known Allergies  Current Outpatient Prescriptions  Medication Sig Dispense Refill  . aspirin 81 MG tablet Take 81 mg by mouth daily.    . carvedilol (COREG) 25 MG tablet Take 2 tablets (50 mg total) by mouth 2 (two) times daily. 360 tablet 3  . Chromium-Cinnamon (CINNAMON PLUS CHROMIUM PO) Take 1 tablet by mouth 2 (two) times daily.     . Flaxseed, Linseed, 1300 MG CAPS Take 1,300 mg by mouth 2 (two) times daily.     . furosemide (LASIX) 40 MG tablet Take 40 mg by mouth daily.    . furosemide (LASIX) 40 MG tablet TAKE ONE-HALF TABLET BY MOUTH ONCE DAILY 90 tablet 3  . glipiZIDE (GLUCOTROL) 10 MG tablet Take 10 mg by mouth daily as needed (for high blood surgars). For high blood sugars    . insulin glargine (LANTUS) 100 UNIT/ML injection Inject 20  Units into the skin daily as needed (elevated blood sugars).     [name] Kitchen lisinopril-hydrochlorothiazide (PRINZIDE,ZESTORETIC) 20-25 MG per tablet Take 1 tablet by mouth daily. 90 tablet 3  . metFORMIN (GLUCOPHAGE) 500 MG tablet Take 500 mg by mouth 4 (four) times daily.    . Multiple Vitamin (MULTIVITAMIN WITH MINERALS) TABS Take 1 tablet by mouth every morning.    . nitroGLYCERIN (NITROSTAT) 0.4 MG SL tablet Place 1 tablet (0.4 mg total) under the tongue every 5  (five) minutes as needed. For chest pain 25 tablet 11   No current facility-administered medications for this visit.    Past Medical History  Diagnosis Date  . Diabetes mellitus   . A-fib   . Complete heart block   . Coronary artery disease     seen at [facility] cardiology   . Hypertension     Dr. Veronia Beets, primary  . Sleep apnea     cpap, 11, last sleep study Jan2013, sees Dr. Keturah Barre  . Neuromuscular disorder     carpal tunnel bilaterally  . Polio     hx of  . Chronic back pain   . Pacemaker 03/07/2012    replaced  . Status post placement of cardiac pacemaker, 03/07/12 New Medtronic Gen and new Vent lead original placed 2008 03/08/2012  . Anticoagulant long-term use, on coumadin for PAF 03/08/2012  . Hx of echocardiogram 06/29/2008    EF 45-50%     Past Surgical History  Procedure Laterality Date  . Coronary artery bypass graft  1990  . Stents    . Coronary artery bypass graft  2004  . Maze procedure  2004  . Pacemaker insertion  08, 1/14  . Back surgery    . Cardiac catheterization    . Tonsillectomy    . Appendectomy    . Hernia repair    . Lumbar laminectomy/decompression microdiscectomy  01/10/2012    Procedure: LUMBAR LAMINECTOMY/DECOMPRESSION MICRODISCECTOMY 1 LEVEL;  Surgeon: [name], MD;  Location: [place] [facility];  Service: Neurosurgery;  Laterality: Bilateral;  Lumbar three-four decompression, Posterior lateral fusion lumbar three-four, posterior spinus plate lumbar three-four  . Insert / replace / remove pacemaker  03/07/2012    replaced  . Joint replacement      Hx: of left knee  . Fracture surgery      Hx: of right leg  . Nasal septum surgery N/A 14  . Posterior fusion lumbar spine  08/28/2012    Dr Ronnald Ramp  . Esophagogastroduodenoscopy Left 03/18/2013    Procedure: ESOPHAGOGASTRODUODENOSCOPY (EGD);  Surgeon: Cleotis Nipper, MD;  Location: [facility] ENDOSCOPY;  Service: Endoscopy;  Laterality: Left;    Family History  Problem Relation Age of  Onset  . Other Father     MVA  . Cancer Mother     History   Social History  . Marital Status: Single    Spouse Name: N/A    Number of Children: N/A  . Years of Education: N/A   Occupational History  . pilot-retired    Social History Main Topics  . Smoking status: Never Smoker   . Smokeless tobacco: Never Used  . Alcohol Use: No  . Drug Use: No  . Sexual Activity: Yes   Other Topics Concern  . Not on file   Social History Narrative   [place] [facility], fishes.     Review of systems: The patient specifically denies any chest pain at rest or with exertion, dyspnea at  rest or with exertion, orthopnea, paroxysmal nocturnal dyspnea, syncope, palpitations, focal neurological deficits, intermittent claudication, lower extremity edema, unexplained weight gain, cough, hemoptysis or wheezing.  The patient also denies abdominal pain, nausea, vomiting, dysphagia, diarrhea, constipation, polyuria, polydipsia, dysuria, hematuria, frequency, urgency, abnormal bleeding or bruising, fever, chills, unexpected weight changes, mood swings, change in skin or hair texture, change in voice quality, auditory or visual problems, allergic reactions or rashes, new musculoskeletal complaints other than usual "aches and pains".   PHYSICAL EXAM BP 150/86 mmHg  Pulse 75  Ht 5\' 10"  (1.778 m)  Wt 230 lb 1.6 oz (104.373 kg)  BMI 33.02 kg/m2 General: Alert, oriented x3, no distress  Head: no evidence of trauma, PERRL, EOMI, no exophtalmos or lid lag, no myxedema, no xanthelasma; normal ears, nose and oropharynx  Neck: normal jugular venous pulsations and no hepatojugular reflux; brisk carotid pulses without delay and no carotid bruits  Chest: clear to auscultation, no signs of consolidation by percussion or palpation, normal fremitus, symmetrical and full respiratory excursions; sternotomy and pacemaker scars  Cardiovascular: normal position and quality of the apical impulse,  regular rhythm, normal first and second heart sounds, no murmurs, rubs or gallops  Abdomen: no tenderness or distention, no masses by palpation, no abnormal pulsatility or arterial bruits, normal bowel sounds, no hepatosplenomegaly  Extremities: Asymmetrical leg atrophy due to childhood polio, no clubbing, cyanosis or edema; 2+ radial, ulnar and brachial pulses bilaterally; 2+ right femoral, posterior tibial and dorsalis pedis pulses; 2+ left femoral, posterior tibial and dorsalis pedis pulses; no subclavian or femoral bruits  Neurological: grossly nonfocal   EKG:  Atrial paced, ventricular sensed, unchanged or R waves in leads V1-V2 consistent with old posterior infarction and inverted T waves in leads V1-V3. QTC 437 ms  Lipid Panel     Component Value Date/Time   CHOL 134 12/20/2010 0903   TRIG 104 12/20/2010 0903   HDL 38* 12/20/2010 0903   CHOLHDL 3.5 12/20/2010 0903   VLDL 21 12/20/2010 0903   LDLCALC 75 12/20/2010 0903    BMET    Component Value Date/Time   NA 137 03/18/2013 0704   K 4.5 03/18/2013 0704   CL 103 03/18/2013 0704   CO2 22 03/18/2013 0704   GLUCOSE 183* 03/18/2013 0704   BUN 40* 03/18/2013 0704   CREATININE 1.42* 03/18/2013 0704   CALCIUM 8.6 03/18/2013 0704   GFRNONAA 48* 03/18/2013 0704   GFRAA 56* 03/18/2013 0704     ASSESSMENT AND PLAN Recurrent GI bleeding   It is important to note that his GI bleeding occurred even when he was not anticoagulated. Still reluctant to restart anticoagulants since he has required so many transfusions recently.  Since atrial fibrillation has not been detected in a long time will continue with pacemaker monitoring..   Non-sustained ventricular tachycardia  Asymptomatic. Similar to previous pacemaker checks he has infrequent episodes of nonsustained VT, up to 19 beats in duration.  ATRIAL FIBRILLATION, PAROXYSMAL s/p surgical MAZE  No recent events. No history of stroke/TIA. CHADS Vasc score is 3, HAS-BLED score is  4. Continue to monitor for arrhythmia recurrence.   Ischemic cardiomyopathy  Previously reported to have mildly decreased EF but the echo Jun 25, 2013 showed normal LVEF.   CORONARY ARTERY BYPASS GRAFT, HX OF, in 1990 and Re-do CABG 2004 with Atrial Maze procedure,  No angina or CHF   Dyslipidemia  Statin intolerant   Pacemaker - Medtronic Adapta dual chamber  No meaningful arrhythmia has been recorded. Normal lead  and battery parameters. 98% atrial pacing, <1% ventricular pacing  Hypertension His blood pressure is normally better when he checks it at home, slightly high today. Asked him to call me if he has systolic blood pressure greater than 140 on repeated occasions. Orders Placed This Encounter  Procedures  . EKG 12-Lead   No orders of the defined types were placed in this encounter.    Holli Humbles, MD, [place] [phone] office [phone] pager

## 2014-01-06 NOTE — Patient Instructions (Signed)
Your physician recommends that you schedule a follow-up appointment in: 3 months with Dr.[name] + pacemaker check.  

## 2014-01-08 ENCOUNTER — Encounter: Payer: Medicare Other | Admitting: Cardiovascular Disease

## 2014-01-13 ENCOUNTER — Encounter: Payer: Self-pay | Admitting: Cardiovascular Disease

## 2014-01-15 ENCOUNTER — Encounter (HOSPITAL_COMMUNITY): Payer: Self-pay | Admitting: Cardiology

## 2014-01-22 ENCOUNTER — Encounter: Payer: Self-pay | Admitting: Internal Medicine

## 2014-01-22 ENCOUNTER — Ambulatory Visit (INDEPENDENT_AMBULATORY_CARE_PROVIDER_SITE_OTHER): Payer: Medicare Other | Admitting: Internal Medicine

## 2014-01-22 VITALS — BP 128/86 | HR 81 | Ht 70.0 in | Wt 234.4 lb

## 2014-01-22 DIAGNOSIS — B91 Sequelae of poliomyelitis: Secondary | ICD-10-CM

## 2014-01-22 DIAGNOSIS — G4733 Obstructive sleep apnea (adult) (pediatric): Secondary | ICD-10-CM

## 2014-01-22 NOTE — Patient Instructions (Signed)
We can continue CPAP 12/ Advanced  Please call as needed

## 2014-01-22 NOTE — Progress Notes (Signed)
[birth year]/11 70 yoM never smoker followed for OSA, history UPPP. Complicated by Post polio, AFib/ pacemaker   PCP Dr Melford Aase LOV-Jan 28, 2010 He has continued using CPAP 11/Advanced all night every night. He denies snoring or daytime tiredness. He likes his nasal pillows mask.  10/03/2012 Acute OV  Complains DOE, hoarseness, dry cough since back surgery 4weeks ago.  has been on cefdinir and augmentin (finished this 8/25). Seen by PCP x 2 with neg cxr per pt. No records available (records requested) .  No fever, discolored mucus, chest pain , calf pain or swelling.  Cough is driving him crazy, dry cough all the time.  Is on an ace inhibitor, lisinopril 20mg  (1/4 tab ) with low /nml b/p.  No weight loss, hemoptysis.   11/15/12- 30 yoM never smoker followed for OSA, history UPPP. Complicated by Post polio, AFib/ pacemaker   PCP Dr Melford Aase Follow up.  Wearing CPAP 11/Advanced everynight for approx 8-9 hours.  No problems with mask or pressure.  Cough has resolved.   Discussed flu shot Feels CPAP might need to be higher. Has had cough since intubation for back surgery.  01/22/14- 72 yoM never smoker followed for OSA, history UPPP. Complicated by Post polio, AFib/ pacemaker   PCP Dr Melford Aase FOLLOWS [id number] CPAP 12/ Advanced every night  We had changed CPAP pressure to 12 and he likes this better.. Never sleeps without CPAP all night. Incidental fall, cracked ribs and hurt right shoulder but healing without surgery.  ROS-HPI Constitutional:   No-   weight loss, night sweats, fevers, chills, fatigue, lassitude. HEENT:   No-  headaches, difficulty swallowing, tooth/dental problems, sore throat,       No-  sneezing, itching, ear ache, nasal congestion, post nasal drip,  CV:  No-   chest pain, orthopnea, PND, swelling in lower extremities, anasarca, dizziness, palpitations Resp: No-   shortness of breath with exertion or at rest.  + nonproductive cough            No-   No- coughing up of blood.    No-   change in color of mucus.  No- wheezing.   Skin: No-   rash or lesions. GI:  No-   heartburn, indigestion, abdominal pain, nausea, vomiting,  GU:  MS:  No-   joint pain or swelling.   Neuro-     nothing unusual Psych:  No- change in mood or affect. No depression or anxiety.  No memory loss.  OBJ General- Alert, Oriented, Affect-appropriate, Distress- none acute, overweight Skin- rash-none, lesions- none, excoriation- none Lymphadenopathy- none Head- atraumatic            Eyes- Gross vision intact, PERRLA, conjunctivae clear secretions            Ears- Hearing, canals-normal            Nose- Clear, no-Septal dev, mucus, polyps, erosion, perforation             Throat- Mallampati III/ +sp UPPP , mucosa clear , drainage- none, tonsils- atrophic Neck- flexible , trachea midline, no stridor , thyroid nl, carotid no bruit Chest - symmetrical excursion , unlabored           Heart/CV- RRR to palpation , no murmur , no gallop  , no rub, nl s1 s2                           - JVD- none , edema- none, stasis changes-  none, varices- none           Lung- clear to P&A, wheeze- none, cough- none , dullness-none, rub- none           Chest wall-  Abd- Br/ Gen/ Rectal- Not done, not indicated Extrem- + decreased range of motion right shoulder when he extends arm shake hands Neuro- grossly intact to observation

## 2014-01-23 NOTE — Assessment & Plan Note (Signed)
Very good compliance and control by his report. Plan-continue CPAP 12

## 2014-01-23 NOTE — Assessment & Plan Note (Signed)
We don't seem to be seeing impaired diaphragm movement or post polio problems affecting his breathing.

## 2014-03-24 ENCOUNTER — Other Ambulatory Visit: Payer: Self-pay | Admitting: Orthopedic Surgery

## 2014-03-26 ENCOUNTER — Encounter (HOSPITAL_COMMUNITY): Payer: Self-pay

## 2014-03-26 ENCOUNTER — Telehealth: Payer: Self-pay | Admitting: Cardiovascular Disease

## 2014-03-26 ENCOUNTER — Encounter (HOSPITAL_COMMUNITY)
Admission: RE | Admit: 2014-03-26 | Discharge: 2014-03-26 | Disposition: A | Discharge status: Home / Self Care | Payer: PPO | Source: Ambulatory Visit | Attending: Orthopedic Surgery | Admitting: Orthopedic Surgery

## 2014-03-26 DIAGNOSIS — Z01818 Encounter for other preprocedural examination: Secondary | ICD-10-CM | POA: Insufficient documentation

## 2014-03-26 DIAGNOSIS — M7541 Impingement syndrome of right shoulder: Secondary | ICD-10-CM | POA: Diagnosis not present

## 2014-03-26 DIAGNOSIS — M75101 Unspecified rotator cuff tear or rupture of right shoulder, not specified as traumatic: Secondary | ICD-10-CM | POA: Diagnosis not present

## 2014-03-26 LAB — BASIC METABOLIC PANEL
ANION GAP: 9 (ref 5–15)
BUN: 27 mg/dL — ABNORMAL HIGH (ref 6–23)
CO2: 24 mmol/L (ref 19–32)
Calcium: 9.7 mg/dL (ref 8.4–10.5)
Chloride: 103 mmol/L (ref 96–112)
Creatinine, Ser: 1.39 mg/dL — ABNORMAL HIGH (ref 0.50–1.35)
GFR, EST AFRICAN AMERICAN: 57 mL/min — AB (ref >90)
GFR, EST NON AFRICAN AMERICAN: 49 mL/min — AB (ref >90)
Glucose, Bld: 279 mg/dL — ABNORMAL HIGH (ref 70–99)
POTASSIUM: 5.8 mmol/L — AB (ref 3.5–5.1)
Sodium: 136 mmol/L (ref 135–145)

## 2014-03-26 LAB — CBC
HCT: 38.1 % — ABNORMAL LOW (ref 39.0–52.0)
Hemoglobin: 12.8 g/dL — ABNORMAL LOW (ref 13.0–17.0)
MCH: 31 pg (ref 26.0–34.0)
MCHC: 33.6 g/dL (ref 30.0–36.0)
MCV: 92.3 fL (ref 78.0–100.0)
PLATELETS: 165 10*3/uL (ref 150–400)
RBC: 4.13 MIL/uL — ABNORMAL LOW (ref 4.22–5.81)
RDW: 12.9 % (ref 11.5–15.5)
WBC: 5.1 10*3/uL (ref 4.0–10.5)

## 2014-03-26 LAB — SURGICAL PCR SCREEN
MRSA, PCR: NEGATIVE
Staphylococcus aureus: NEGATIVE

## 2014-03-26 NOTE — Pre-Procedure Instructions (Signed)
JAMASON LANDIS  03/26/2014   Your procedure is scheduled on:  Monday, Feb. 22nd   Report to [facility] Admitting at 8:00 AM.  Call this number if you have problems the morning of surgery: [phone]   Remember:   Do not eat food or drink liquids after midnight Sunday.   Take these medicines the morning of surgery with A SIP OF WATER: Coreg                           Do NOT take any diabetes medication the morning of surgery.   Do not wear jewelry - no rings or watches.  Do not wear lotions - or colognes.  You may NOT wear deodorant the day of surgery.   Men may shave face and neck.   Do not bring valuables to the hospital.  [facility] is not responsible for any belongings or valuables.               Contacts, dentures or bridgework may not be worn into surgery.  Leave suitcase in the car. After surgery it may be brought to your room.  For patients admitted to the hospital, discharge time is determined by your treatment team.    Name and phone number of your driver:    Special Instructions: "Preparing for Surgery" instruction sheet.   Please read over the following fact sheets that you were given: Pain Booklet, Coughing and Deep Breathing, MRSA Information and Surgical Site Infection Prevention

## 2014-03-26 NOTE — Telephone Encounter (Signed)
New Msg        Debbie from [facility] calling.   Please call back about pt device. [phone].

## 2014-03-27 LAB — HEMOGLOBIN A1C
Hgb A1c MFr Bld: 7.7 % — ABNORMAL HIGH (ref 4.8–5.6)
MEAN PLASMA GLUCOSE: 174 mg/dL

## 2014-03-27 NOTE — Progress Notes (Signed)
Anesthesia Chart Review: Patient is a 74 year old male scheduled for right shoulder arthroscopy with possible open rotator cuff repair on 03/30/14 by Dr. Mayer Camel. Juliann Pulse at Dr. Damita Dunnings office states that they contacted [facility] and were told case would need to be done a [facility] due to his cardiac history.  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 PCP is Dr. [name] at [facility].  Meds include ASA, Coreg, glipizide, Lantus, lisinopril-HCTZ, metformin, fish oil.  Cardiologist is Dr. Sallyanne Kuster, last visit 01/06/14. 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 QTC 437 ms." Pacemaker interrogation revealed, "No meaningful arrhythmia has been recorded. Normal lead and battery parameters. 98% atrial pacing, <1% ventricular pacing." Patient was without angina of CHF symptoms. Dr. Victorino December 01/06/14 note mentions that patient reported having false positive stress tests in the past so his former cardiologist Dr. Rex Kras had told him to "always have a cath rather than a stress test."  None the less, Dr. Sallyanne Kuster did not order any additional testing at his most recent office visit.   06/25/13 Echo: - Left ventricle: The cavity size was mildly dilated. Systolic function was normal. The estimated ejection fraction was in the range of 55% to 60%. Although no diagnostic regional wall motion abnormality was identified,  this possibility cannot be completely excluded on the basis of this study. Arrhythmia limits evaluation of LV diastolic function. Borderline criteria for elevated mean LA pressure. - Mitral valve: Calcified annulus. - Left atrium: The atrium was moderately dilated. - Pulmonary arteries: Systolic pressure was mildly increased. PA peak pressure: 37 mm Hg (S).  Cardiac cath on 12/21/10 showed:  1. Widely patent LIMA-D1-LAD with excellent distal LAD and diagonal flow.  2. Widely patent SVG-RPDA with retrograde filling to the RPL system and native RCA demonstrating diffuse in-stent stenosis of RCA stent.  3. Known subtotal occlusion of RPDA beyond SVG.  4. Native circumflex was large OM 2 small moderate OM1 and remaining vessel moderate disease, most significant being ostial OM1 60% in Xarelto <2 mm vessel. This appeared angiographically similar to prior angiography in 2011.  5. Left Ventriculography: EF 50-55%, no obvious wall motion abnormalities.  12/23/13 CXR (Care Everywhere): FINDINGS: Megaly.  Pulmonary vasculature is normal.  Sternal wires and mediastinal clips are present.  The lungs are free of consolidation.  No mass, pleural effusion or pneumothorax.  A left subclavian pacemaker is noted.  An abandoned lead is seen in the right ventricle.  One lytic changes in the thoracic spine.  IMPRESSION: No evidence of acute cardiopulmonary disease.    Preoperative labs noted. K 5.8--there is no mention of hemolysis. BUN/Cr 27/1.39. Glucose was 279.  H/H 12.8/38.1.  A1C on 03/26/14 was 7.7.  I notified Juliann Pulse at Dr. Damita Dunnings office of K+ results.  I will order an ISTAT8 (since lab trends show an indication of CKD) on arrival to re-evaluate K+.     If no new CV/CHF or arrhythmias and follow-up labs are acceptable then I  would anticipate that he could proceed as planned. PPM perioperative device RX is still pending from Dr. Victorino December office.  George Hugh [facility] Short Stay  Center/Anesthesiology Phone [phone] 03/27/2014 10:43 AM

## 2014-03-28 NOTE — H&P (Signed)
Alan [name] is an 74 y.o. male.    Chief Complaint: Left Shoulder Pain  HPI: Patient presents for followup of left knee pain and right shoulder pain.  Recall but he is status post left total knee replacement.  He says that his shoulder is bothering her more than the knee.  The knee causes him pain if he is lying on his side with the knees touching.  Location of pain is primarily medial left knee.  He denies any significant swelling of this time.  He has not tried anything for this besides icing.  Spurs the right shoulder, he notes persistent right shoulder pain that had been relieved status post injection for about one week.  He has significant pain with arm abduction over his head and associated weakness mainly due to pain.  This is been affecting him for several months and wakes him up at night.  Recall that he cannot have an MRI of his shoulder because he has a pacemaker.  Past Medical History  Diagnosis Date  . Diabetes mellitus   . A-fib   . Complete heart block   . Coronary artery disease     seen at [facility]   . Hypertension     Dr. Chan Badger, primary  . Sleep apnea     cpap, 11, last sleep study Jan2013, sees Dr. Clint Young  . Neuromuscular disorder     carpal tunnel bilaterally  . Polio     hx of  . Chronic back pain   . Pacemaker 03/07/2012    replaced  . Status post placement of cardiac pacemaker, 03/07/12 New Medtronic Gen and new Vent lead original placed 2008 03/08/2012  . Anticoagulant long-term use, on coumadin for PAF 03/08/2012  . Hx of echocardiogram 06/29/2008    EF 45-50%     Past Surgical History  Procedure Laterality Date  . Coronary artery bypass graft  1990  . Stents    . Coronary artery bypass graft  2004  . Maze procedure  2004  . Pacemaker insertion  08, 1/14  . Back surgery    . Cardiac catheterization    . Tonsillectomy    . Appendectomy    . Hernia repair    . Lumbar laminectomy/decompression microdiscectomy  01/10/2012     Procedure: LUMBAR LAMINECTOMY/DECOMPRESSION MICRODISCECTOMY 1 LEVEL;  Surgeon: David S Jones, MD;  Location: [facility] NEURO ORS;  Service: Neurosurgery;  Laterality: Bilateral;  Lumbar three-four decompression, Posterior lateral fusion lumbar three-four, posterior spinus plate lumbar three-four  . Insert / replace / remove pacemaker  03/07/2012    replaced  . Joint replacement      Hx: of left knee  . Fracture surgery      Hx: of right leg  . Nasal septum surgery N/A 14  . Posterior fusion lumbar spine  08/28/2012    Dr Jones  . Esophagogastroduodenoscopy Left 03/18/2013    Procedure: ESOPHAGOGASTRODUODENOSCOPY (EGD);  Surgeon: Robert V Buccini, MD;  Location: [facility] ENDOSCOPY;  Service: Endoscopy;  Laterality: Left;  . Left heart catheterization with coronary/graft angiogram N/A 12/21/2010    Procedure: LEFT HEART CATHETERIZATION WITH CORONARY/GRAFT ANGIOGRAM;  Surgeon: David W Harding, MD;  Location: [facility] CATH LAB;  Service: Cardiovascular;  Laterality: N/A;  . Pacemaker revision N/A 03/07/2012    Procedure: PACEMAKER REVISION;  Surgeon: Mihai [name], MD;  Location: [facility] CATH LAB;  Service: Cardiovascular;  Laterality: N/A;    Family History  Problem Relation Age of Onset  . Other Father       MVA  . Cancer Mother    Social History:  reports that he has never smoked. He has never used smokeless tobacco. He reports that he does not drink alcohol or use illicit drugs.  Allergies: No Known Allergies  No prescriptions prior to admission    Results for orders placed or performed during the hospital encounter of 03/26/14 (from the past 48 hour(s))  Surgical pcr screen     Status: None   Collection Time: 03/26/14 11:31 AM  Result Value Ref Range   MRSA, PCR NEGATIVE NEGATIVE   Staphylococcus aureus NEGATIVE NEGATIVE    Comment:        The Xpert SA Assay (FDA approved for NASAL specimens in patients over 21 years of age), is one component of a comprehensive surveillance program.  Test  performance has been validated by [facility] for patients greater than or equal to 1 year old. It is not intended to diagnose infection nor to guide or monitor treatment.   Hemoglobin A1c     Status: Abnormal   Collection Time: 03/26/14 11:31 AM  Result Value Ref Range   Hgb A1c MFr Bld 7.7 (H) 4.8 - 5.6 %    Comment: (NOTE)         Pre-diabetes: 5.7 - 6.4         Diabetes: >6.4         Glycemic control for adults with diabetes: <7.0    Mean Plasma Glucose 174 mg/dL    Comment: (NOTE) Performed At: [facility] [address] [place], [place] [id number] [place] William F MD Ph:[phone]   CBC     Status: Abnormal   Collection Time: 03/26/14 11:32 AM  Result Value Ref Range   WBC 5.1 4.0 - 10.5 K/uL   RBC 4.13 (L) 4.22 - 5.81 MIL/uL   Hemoglobin 12.8 (L) 13.0 - 17.0 g/dL   HCT 38.1 (L) 39.0 - 52.0 %   MCV 92.3 78.0 - 100.0 fL   MCH 31.0 26.0 - 34.0 pg   MCHC 33.6 30.0 - 36.0 g/dL   RDW 12.9 11.5 - 15.5 %   Platelets 165 150 - 400 K/uL  Basic metabolic panel     Status: Abnormal   Collection Time: 03/26/14 11:32 AM  Result Value Ref Range   Sodium 136 135 - 145 mmol/L   Potassium 5.8 (H) 3.5 - 5.1 mmol/L   Chloride 103 96 - 112 mmol/L   CO2 24 19 - 32 mmol/L   Glucose, Bld 279 (H) 70 - 99 mg/dL   BUN 27 (H) 6 - 23 mg/dL   Creatinine, Ser 1.39 (H) 0.50 - 1.35 mg/dL   Calcium 9.7 8.4 - 10.5 mg/dL   GFR calc non Af Amer 49 (L) >90 mL/min   GFR calc Af Amer 57 (L) >90 mL/min    Comment: (NOTE) The eGFR has been calculated using the CKD EPI equation. This calculation has not been validated in all clinical situations. eGFR's persistently <90 mL/min signify possible Chronic Kidney Disease.    Anion gap 9 5 - 15   No results found.  Review of Systems  Constitutional: Negative.   HENT: Negative.   Eyes:       Glasses  Respiratory:       Sleep apnea  Cardiovascular: Negative.   Gastrointestinal: Negative.   Musculoskeletal: Positive for joint pain.   Skin: Negative.   Neurological: Negative.   Endo/Heme/Allergies: Bruises/bleeds easily.  Psychiatric/Behavioral: Negative.     There were no vitals taken   for this visit. Physical Exam  Constitutional: He is oriented to person, place, and time. He appears well-developed and well-nourished.  HENT:  Head: Normocephalic and atraumatic.  Eyes: Pupils are equal, round, and reactive to light.  Neck: Normal range of motion. Neck supple.  Cardiovascular: Intact distal pulses.   Respiratory: Effort normal.  Musculoskeletal:  Left knee examination reveals no obvious effusion.  No overlying erythema or induration.  Range of motion is from 0-115 with mild pain in the tampon of motion.  Medial joint line and soft tissue tenderness to palpation.  No valgus or varus instability.  Examination of the right shoulder reveals flexion to 160 actively, abduction to 100, full internal and external rotation.  Weakness elicited with Jobes test.  Positive Hawkins.  Negative speed's.  Neurological: He is alert and oriented to person, place, and time.  Skin: Skin is warm and dry.  Psychiatric: He has a normal mood and affect. His behavior is normal. Judgment and thought content normal.     Assessment/Plan Assessment: 1. right shoulder pain, possible rotator cuff tear with impingement symptoms 2. left knee pain, status post left knee arthroplasty in 2005  Plan: Treatment options were discussed with the patient today.  At this point, given the suboptimal response to injection therapy, the patient wishes to proceed with arthroscopic surgery of the right shoulder with possible rotator cuff repair.  We discussed risks, benefits, and alternatives to surgery.  He'll need a surgical scheduler in followup pending scheduling of surgery.  As far as left knee, he will start icing and using Voltaren gel as needed for pain.  He will call sooner in the interim if he has any questions.  Nithila Sumners R 03/28/2014, 8:42  AM

## 2014-03-29 MED ORDER — CEFAZOLIN SODIUM-DEXTROSE 2-3 GM-% IV SOLR: 2.0000 g | INTRAVENOUS | Status: AC

## 2014-03-29 MED ORDER — DEXTROSE-NACL 5-0.45 % IV SOLN: INTRAVENOUS | Status: DC

## 2014-03-29 MED ORDER — CHLORHEXIDINE GLUCONATE 4 % EX LIQD
60.0000 mL | Freq: Once | CUTANEOUS | Status: DC
  Filled 2014-03-29: qty 60

## 2014-03-29 NOTE — Anesthesia Preprocedure Evaluation (Addendum)
Anesthesia Evaluation  Patient identified by MRN, date of birth, ID band Patient awake    Reviewed: Allergy & Precautions, NPO status , Patient's Chart, lab work & pertinent test results, reviewed documented beta blocker date and time   Airway Mallampati: II   Neck ROM: Full    Dental  (+) Teeth Intact, Dental Advisory Given   Pulmonary sleep apnea and Continuous Positive Airway Pressure Ventilation ,  breath sounds clear to auscultation        Cardiovascular hypertension, Pt. on medications + CAD and + CABG (re do 2004) + dysrhythmias Atrial Fibrillation + pacemaker (dual chamber) Rhythm:Regular  ECHO 2015 EF 60%, followed by cardiology regulary   Neuro/Psych    GI/Hepatic negative GI ROS, Neg liver ROS,   Endo/Other  diabetes, Poorly Controlled, Type 2, Insulin Dependent  Renal/GU Renal InsufficiencyRenal diseaseGFR 50     Musculoskeletal   Abdominal (+)  Abdomen: soft.    Peds  Hematology 12/38   Anesthesia Other Findings   Reproductive/Obstetrics                           Anesthesia Physical Anesthesia Plan  ASA: III  Anesthesia Plan: General   Post-op Pain Management: MAC Combined w/ Regional for Post-op pain   Induction: Intravenous  Airway Management Planned: Oral ETT  Additional Equipment:   Intra-op Plan:   Post-operative Plan: Extubation in OR  Informed Consent: I have reviewed the patients History and Physical, chart, labs and discussed the procedure including the risks, benefits and alternatives for the proposed anesthesia with the patient or authorized representative who has indicated his/her understanding and acceptance.     Plan Discussed with:   Anesthesia Plan Comments:         Anesthesia Quick Evaluation

## 2014-03-30 ENCOUNTER — Ambulatory Visit (HOSPITAL_COMMUNITY): Payer: PPO | Admitting: Vascular Surgery

## 2014-03-30 ENCOUNTER — Encounter (HOSPITAL_COMMUNITY)
Admission: RE | Disposition: A | Discharge status: Home / Self Care | Payer: Self-pay | Source: Ambulatory Visit | Attending: Orthopedic Surgery

## 2014-03-30 ENCOUNTER — Ambulatory Visit (HOSPITAL_COMMUNITY): Payer: PPO | Admitting: Anesthesiology

## 2014-03-30 ENCOUNTER — Ambulatory Visit (HOSPITAL_COMMUNITY)
Admission: RE | Admit: 2014-03-30 | Discharge: 2014-03-30 | Disposition: A | Discharge status: Home / Self Care | Payer: PPO | Source: Ambulatory Visit | Attending: Orthopedic Surgery | Admitting: Orthopedic Surgery

## 2014-03-30 ENCOUNTER — Encounter (HOSPITAL_COMMUNITY): Payer: Self-pay | Admitting: *Deleted

## 2014-03-30 DIAGNOSIS — Z950 Presence of cardiac pacemaker: Secondary | ICD-10-CM | POA: Insufficient documentation

## 2014-03-30 DIAGNOSIS — I10 Essential (primary) hypertension: Secondary | ICD-10-CM | POA: Diagnosis not present

## 2014-03-30 DIAGNOSIS — Z955 Presence of coronary angioplasty implant and graft: Secondary | ICD-10-CM | POA: Diagnosis not present

## 2014-03-30 DIAGNOSIS — I4891 Unspecified atrial fibrillation: Secondary | ICD-10-CM | POA: Diagnosis not present

## 2014-03-30 DIAGNOSIS — M7541 Impingement syndrome of right shoulder: Secondary | ICD-10-CM | POA: Insufficient documentation

## 2014-03-30 DIAGNOSIS — I2510 Atherosclerotic heart disease of native coronary artery without angina pectoris: Secondary | ICD-10-CM | POA: Diagnosis not present

## 2014-03-30 DIAGNOSIS — M25562 Pain in left knee: Secondary | ICD-10-CM | POA: Diagnosis not present

## 2014-03-30 DIAGNOSIS — Z96652 Presence of left artificial knee joint: Secondary | ICD-10-CM | POA: Insufficient documentation

## 2014-03-30 DIAGNOSIS — M75101 Unspecified rotator cuff tear or rupture of right shoulder, not specified as traumatic: Secondary | ICD-10-CM | POA: Diagnosis not present

## 2014-03-30 DIAGNOSIS — Z951 Presence of aortocoronary bypass graft: Secondary | ICD-10-CM | POA: Diagnosis not present

## 2014-03-30 DIAGNOSIS — E119 Type 2 diabetes mellitus without complications: Secondary | ICD-10-CM | POA: Diagnosis not present

## 2014-03-30 HISTORY — PX: SHOULDER OPEN ROTATOR CUFF REPAIR: SHX2407

## 2014-03-30 HISTORY — PX: SHOULDER ARTHROSCOPY WITH OPEN ROTATOR CUFF REPAIR: SHX6092

## 2014-03-30 LAB — POCT I-STAT, CHEM 8
BUN: 30 mg/dL — ABNORMAL HIGH (ref 6–23)
CALCIUM ION: 1.18 mmol/L (ref 1.13–1.30)
CREATININE: 1.4 mg/dL — AB (ref 0.50–1.35)
Chloride: 104 mmol/L (ref 96–112)
Glucose, Bld: 236 mg/dL — ABNORMAL HIGH (ref 70–99)
HCT: 42 % (ref 39.0–52.0)
HEMOGLOBIN: 14.3 g/dL (ref 13.0–17.0)
Potassium: 4.4 mmol/L (ref 3.5–5.1)
Sodium: 138 mmol/L (ref 135–145)
TCO2: 19 mmol/L (ref 0–100)

## 2014-03-30 LAB — GLUCOSE, CAPILLARY: Glucose-Capillary: 178 mg/dL — ABNORMAL HIGH (ref 70–99)

## 2014-03-30 SURGERY — ARTHROSCOPY, SHOULDER WITH REPAIR, ROTATOR CUFF, OPEN
Anesthesia: General | Site: Shoulder | Laterality: Right

## 2014-03-30 MED ORDER — MIDAZOLAM HCL 2 MG/2ML IJ SOLN
INTRAMUSCULAR | Status: AC
  Filled 2014-03-30: qty 2

## 2014-03-30 MED ORDER — MIDAZOLAM HCL 5 MG/5ML IJ SOLN
INTRAMUSCULAR | Status: DC | PRN
  Administered 2014-03-30: 1 mg via INTRAVENOUS

## 2014-03-30 MED ORDER — MIDAZOLAM HCL 2 MG/2ML IJ SOLN
1.0000 mg | INTRAMUSCULAR | Status: DC | PRN
  Administered 2014-03-30: 1 mg via INTRAVENOUS

## 2014-03-30 MED ORDER — SODIUM CHLORIDE 0.9 % IR SOLN
Status: DC | PRN
  Administered 2014-03-30: 3000 mL
  Administered 2014-03-30: 1000 mL
  Administered 2014-03-30: 3000 mL
  Administered 2014-03-30: 1000 mL

## 2014-03-30 MED ORDER — PHENYLEPHRINE HCL 10 MG/ML IJ SOLN
INTRAMUSCULAR | Status: DC | PRN
  Administered 2014-03-30: 80 ug via INTRAVENOUS

## 2014-03-30 MED ORDER — SODIUM CHLORIDE 0.9 % IV SOLN
10.0000 mg | INTRAVENOUS | Status: DC | PRN
  Administered 2014-03-30: 10 ug/min via INTRAVENOUS

## 2014-03-30 MED ORDER — NEOSTIGMINE METHYLSULFATE 10 MG/10ML IV SOLN
INTRAVENOUS | Status: AC
  Filled 2014-03-30: qty 1

## 2014-03-30 MED ORDER — LACTATED RINGERS IV SOLN
INTRAVENOUS | Status: DC | PRN
  Administered 2014-03-30 (×2): via INTRAVENOUS

## 2014-03-30 MED ORDER — BUPIVACAINE-EPINEPHRINE (PF) 0.5% -1:200000 IJ SOLN
INTRAMUSCULAR | Status: DC | PRN
  Administered 2014-03-30: 20 mL via PERINEURAL

## 2014-03-30 MED ORDER — EPHEDRINE SULFATE 50 MG/ML IJ SOLN
INTRAMUSCULAR | Status: AC
  Filled 2014-03-30: qty 1

## 2014-03-30 MED ORDER — ARTIFICIAL TEARS OP OINT
TOPICAL_OINTMENT | OPHTHALMIC | Status: DC | PRN
  Administered 2014-03-30: 1 via OPHTHALMIC

## 2014-03-30 MED ORDER — FENTANYL CITRATE 0.05 MG/ML IJ SOLN: 25.0000 ug | INTRAMUSCULAR | Status: DC | PRN

## 2014-03-30 MED ORDER — CEFAZOLIN SODIUM-DEXTROSE 2-3 GM-% IV SOLR
INTRAVENOUS | Status: AC
  Administered 2014-03-30: 2 g via INTRAVENOUS
  Filled 2014-03-30: qty 50

## 2014-03-30 MED ORDER — FENTANYL CITRATE 0.05 MG/ML IJ SOLN
INTRAMUSCULAR | Status: AC
  Filled 2014-03-30: qty 5

## 2014-03-30 MED ORDER — FENTANYL CITRATE 0.05 MG/ML IJ SOLN
INTRAMUSCULAR | Status: AC
  Filled 2014-03-30: qty 2

## 2014-03-30 MED ORDER — DEXAMETHASONE SODIUM PHOSPHATE 4 MG/ML IJ SOLN
INTRAMUSCULAR | Status: DC | PRN
  Administered 2014-03-30: 4 mg via INTRAVENOUS

## 2014-03-30 MED ORDER — ONDANSETRON HCL 4 MG/2ML IJ SOLN
INTRAMUSCULAR | Status: DC | PRN
  Administered 2014-03-30: 4 mg via INTRAVENOUS

## 2014-03-30 MED ORDER — ROCURONIUM BROMIDE 50 MG/5ML IV SOLN
INTRAVENOUS | Status: AC
  Filled 2014-03-30: qty 1

## 2014-03-30 MED ORDER — GLYCOPYRROLATE 0.2 MG/ML IJ SOLN
INTRAMUSCULAR | Status: DC | PRN
  Administered 2014-03-30: 0.6 mg via INTRAVENOUS

## 2014-03-30 MED ORDER — ROCURONIUM BROMIDE 100 MG/10ML IV SOLN
INTRAVENOUS | Status: DC | PRN
  Administered 2014-03-30 (×2): 10 mg via INTRAVENOUS
  Administered 2014-03-30: 30 mg via INTRAVENOUS

## 2014-03-30 MED ORDER — MEPERIDINE HCL 25 MG/ML IJ SOLN: 6.2500 mg | INTRAMUSCULAR | Status: DC | PRN

## 2014-03-30 MED ORDER — SUCCINYLCHOLINE CHLORIDE 20 MG/ML IJ SOLN
INTRAMUSCULAR | Status: DC | PRN
Start: 2014-03-30 — End: 2014-03-30
  Administered 2014-03-30: 120 mg via INTRAVENOUS

## 2014-03-30 MED ORDER — PROPOFOL 10 MG/ML IV BOLUS
INTRAVENOUS | Status: AC
  Filled 2014-03-30: qty 20

## 2014-03-30 MED ORDER — PROMETHAZINE HCL 25 MG/ML IJ SOLN: 6.2500 mg | INTRAMUSCULAR | Status: DC | PRN

## 2014-03-30 MED ORDER — GLYCOPYRROLATE 0.2 MG/ML IJ SOLN
INTRAMUSCULAR | Status: AC
  Filled 2014-03-30: qty 1

## 2014-03-30 MED ORDER — LIDOCAINE HCL (CARDIAC) 20 MG/ML IV SOLN
INTRAVENOUS | Status: AC
  Filled 2014-03-30: qty 5

## 2014-03-30 MED ORDER — NEOSTIGMINE METHYLSULFATE 10 MG/10ML IV SOLN
INTRAVENOUS | Status: DC | PRN
  Administered 2014-03-30: 4 mg via INTRAVENOUS

## 2014-03-30 MED ORDER — FENTANYL CITRATE 0.05 MG/ML IJ SOLN
50.0000 ug | INTRAMUSCULAR | Status: DC | PRN
  Administered 2014-03-30: 50 ug via INTRAVENOUS

## 2014-03-30 MED ORDER — BUPIVACAINE-EPINEPHRINE (PF) 0.5% -1:200000 IJ SOLN
INTRAMUSCULAR | Status: AC
  Filled 2014-03-30: qty 30

## 2014-03-30 MED ORDER — STERILE WATER FOR INJECTION IJ SOLN
INTRAMUSCULAR | Status: AC
  Filled 2014-03-30: qty 10

## 2014-03-30 MED ORDER — OXYCODONE-ACETAMINOPHEN 5-325 MG PO TABS: 1.0000 | ORAL_TABLET | ORAL | Status: DC | PRN

## 2014-03-30 MED ORDER — FENTANYL CITRATE 0.05 MG/ML IJ SOLN
INTRAMUSCULAR | Status: DC | PRN
  Administered 2014-03-30 (×5): 50 ug via INTRAVENOUS

## 2014-03-30 MED ORDER — DEXAMETHASONE SODIUM PHOSPHATE 4 MG/ML IJ SOLN
INTRAMUSCULAR | Status: AC
  Filled 2014-03-30: qty 1

## 2014-03-30 MED ORDER — PROPOFOL 10 MG/ML IV BOLUS
INTRAVENOUS | Status: DC | PRN
  Administered 2014-03-30: 150 mg via INTRAVENOUS
  Administered 2014-03-30: 20 mg via INTRAVENOUS

## 2014-03-30 MED ORDER — LACTATED RINGERS IV SOLN
INTRAVENOUS | Status: DC
  Administered 2014-03-30: 09:00:00 via INTRAVENOUS

## 2014-03-30 MED ORDER — PHENYLEPHRINE 40 MCG/ML (10ML) SYRINGE FOR IV PUSH (FOR BLOOD PRESSURE SUPPORT)
PREFILLED_SYRINGE | INTRAVENOUS | Status: AC
  Filled 2014-03-30: qty 10

## 2014-03-30 MED ORDER — BUPIVACAINE-EPINEPHRINE 0.5% -1:200000 IJ SOLN
INTRAMUSCULAR | Status: DC | PRN
  Administered 2014-03-30: 6 mL
  Administered 2014-03-30: 10 mL

## 2014-03-30 MED ORDER — EPHEDRINE SULFATE 50 MG/ML IJ SOLN
INTRAMUSCULAR | Status: DC | PRN
  Administered 2014-03-30: 5 mg via INTRAVENOUS
  Administered 2014-03-30 (×3): 10 mg via INTRAVENOUS
  Administered 2014-03-30: 5 mg via INTRAVENOUS
  Administered 2014-03-30: 10 mg via INTRAVENOUS

## 2014-03-30 MED ORDER — SUCCINYLCHOLINE CHLORIDE 20 MG/ML IJ SOLN
INTRAMUSCULAR | Status: AC
  Filled 2014-03-30: qty 1

## 2014-03-30 MED ORDER — ARTIFICIAL TEARS OP OINT
TOPICAL_OINTMENT | OPHTHALMIC | Status: AC
  Filled 2014-03-30: qty 3.5

## 2014-03-30 SURGICAL SUPPLY — 70 items
ANCHOR ALLTHRD KNTLS PEEK 5.5 (Anchor) ×8 IMPLANT
ANCHOR PEEK ALL THREAD (Anchor) ×6 IMPLANT
BLADE AVERAGE 25X9 (BLADE) IMPLANT
BLADE CUTTER GATOR 3.5 (BLADE) IMPLANT
BLADE GREAT WHITE 4.2 (BLADE) ×2 IMPLANT
BUR OVAL 4.0 (BURR) IMPLANT
BUR VERTEX HOODED 4.5 (BURR) IMPLANT
CORDS BIPOLAR (ELECTRODE) ×2 IMPLANT
COVER SURGICAL LIGHT HANDLE (MISCELLANEOUS) ×4 IMPLANT
DRAPE IMP U-DRAPE 54X76 (DRAPES) IMPLANT
DRAPE INCISE IOBAN 66X45 STRL (DRAPES) ×2 IMPLANT
DRAPE ORTHO SPLIT 77X108 STRL (DRAPES) ×2
DRAPE STERI 35X30 U-POUCH (DRAPES) ×2 IMPLANT
DRAPE SURG ORHT 6 SPLT 77X108 (DRAPES) ×2 IMPLANT
DRAPE U-SHAPE 47X51 STRL (DRAPES) ×4 IMPLANT
DRSG EMULSION OIL 3X3 NADH (GAUZE/BANDAGES/DRESSINGS) ×2 IMPLANT
DRSG PAD ABDOMINAL 8X10 ST (GAUZE/BANDAGES/DRESSINGS) ×2 IMPLANT
DURAPREP 26ML APPLICATOR (WOUND CARE) ×2 IMPLANT
ELECT CAUTERY BLADE 6.4 (BLADE) ×2 IMPLANT
ELECT MENISCUS 165MM 90D (ELECTRODE) IMPLANT
ELECT REM PT RETURN 9FT ADLT (ELECTROSURGICAL)
ELECTRODE REM PT RTRN 9FT ADLT (ELECTROSURGICAL) IMPLANT
GAUZE SPONGE 4X4 12PLY STRL (GAUZE/BANDAGES/DRESSINGS) ×2 IMPLANT
GAUZE XEROFORM 1X8 LF (GAUZE/BANDAGES/DRESSINGS) ×2 IMPLANT
GLOVE BIO SURGEON STRL SZ7.5 (GLOVE) ×2 IMPLANT
GLOVE BIO SURGEON STRL SZ8.5 (GLOVE) IMPLANT
GLOVE BIOGEL PI IND STRL 8 (GLOVE) ×2 IMPLANT
GLOVE BIOGEL PI IND STRL 9 (GLOVE) ×1 IMPLANT
GLOVE BIOGEL PI INDICATOR 8 (GLOVE) ×2
GLOVE BIOGEL PI INDICATOR 9 (GLOVE) ×1
GOWN STRL REUS W/ TWL LRG LVL3 (GOWN DISPOSABLE) ×2 IMPLANT
GOWN STRL REUS W/ TWL XL LVL3 (GOWN DISPOSABLE) ×3 IMPLANT
GOWN STRL REUS W/TWL LRG LVL3 (GOWN DISPOSABLE) ×2
GOWN STRL REUS W/TWL XL LVL3 (GOWN DISPOSABLE) ×3
KIT BASIN OR (CUSTOM PROCEDURE TRAY) ×2 IMPLANT
KIT ROOM TURNOVER OR (KITS) ×2 IMPLANT
MANIFOLD NEPTUNE II (INSTRUMENTS) ×2 IMPLANT
NDL SUT 6 .5 CRC .975X.05 MAYO (NEEDLE) IMPLANT
NEEDLE 1/2 CIR CATGUT .05X1.09 (NEEDLE) ×2 IMPLANT
NEEDLE 18GX1X1/2 (RX/OR ONLY) (NEEDLE) ×2 IMPLANT
NEEDLE HYPO 25GX1X1/2 BEV (NEEDLE) ×2 IMPLANT
NEEDLE MAYO TAPER (NEEDLE)
NS IRRIG 1000ML POUR BTL (IV SOLUTION) ×2 IMPLANT
PACK ARTHROSCOPY DSU (CUSTOM PROCEDURE TRAY) ×2 IMPLANT
PACK SHOULDER (CUSTOM PROCEDURE TRAY) ×2 IMPLANT
PACK UNIVERSAL I (CUSTOM PROCEDURE TRAY) IMPLANT
PAD ARMBOARD 7.5X6 YLW CONV (MISCELLANEOUS) ×4 IMPLANT
PASSER SUT SWANSON 36MM LOOP (INSTRUMENTS) IMPLANT
PENCIL BUTTON HOLSTER BLD 10FT (ELECTRODE) IMPLANT
PUSHER SUTURE BIPASS DISP (NEEDLE) ×2 IMPLANT
SLING ARM FOAM STRAP XLG (SOFTGOODS) ×2 IMPLANT
SLING ARM IMMOBILIZER LRG (SOFTGOODS) ×2 IMPLANT
SPONGE LAP 4X18 X RAY DECT (DISPOSABLE) ×4 IMPLANT
SUCTION FRAZIER TIP 10 FR DISP (SUCTIONS) ×2 IMPLANT
SUT ETHIBOND 2 OS 4 DA (SUTURE) IMPLANT
SUT FIBERWIRE #2 38 REV NDL BL (SUTURE)
SUT VIC AB 0 CT1 27 (SUTURE) ×2
SUT VIC AB 0 CT1 27XBRD ANBCTR (SUTURE) ×2 IMPLANT
SUT VIC AB 2-0 CT1 27 (SUTURE) ×2
SUT VIC AB 2-0 CT1 TAPERPNT 27 (SUTURE) ×2 IMPLANT
SUT VIC AB 2-0 FS1 27 (SUTURE) IMPLANT
SUTURE FIBERWR#2 38 REV NDL BL (SUTURE) IMPLANT
SYR 3ML 25GX5/8 SAFETY (SYRINGE) ×2 IMPLANT
SYR CONTROL 10ML LL (SYRINGE) ×2 IMPLANT
TAPE CLOTH SURG 6X10 WHT LF (GAUZE/BANDAGES/DRESSINGS) ×2 IMPLANT
TOWEL OR 17X24 6PK STRL BLUE (TOWEL DISPOSABLE) ×2 IMPLANT
TOWEL OR 17X26 10 PK STRL BLUE (TOWEL DISPOSABLE) ×2 IMPLANT
WAND HAND CNTRL MULTIVAC 90 (MISCELLANEOUS) ×2 IMPLANT
WATER STERILE IRR 1000ML POUR (IV SOLUTION) ×2 IMPLANT
YANKAUER SUCT BULB TIP NO VENT (SUCTIONS) IMPLANT

## 2014-03-30 NOTE — Progress Notes (Signed)
Pt c/o unable to take a good deep breath. Dr Tresa Moore informed.

## 2014-03-30 NOTE — Transfer of Care (Signed)
Immediate Anesthesia Transfer of Care Note  Patient: Alan [name]  Procedure(s) Performed: Procedure(s): RIGHT SHOULDER ARTHROSCOPY  OPEN ROTATOR CUFF REPAIR (Right) ROTATOR CUFF REPAIR SHOULDER OPEN (Right)  Patient Location: PACU  Anesthesia Type:General  Level of Consciousness: awake, alert , oriented and sedated  Airway & Oxygen Therapy: Patient Spontanous Breathing and Patient connected to nasal cannula oxygen  Post-op Assessment: Report given to RN and Post -op Vital signs reviewed and stable  Post vital signs: Reviewed and stable  Last Vitals:  Filed Vitals:   03/30/14 1005  BP: 149/61  Pulse: 60  Temp:   Resp: 21    Complications: No apparent anesthesia complications

## 2014-03-30 NOTE — Progress Notes (Signed)
Dr Tresa Moore in to see pt.

## 2014-03-30 NOTE — Anesthesia Procedure Notes (Signed)
Anesthesia Regional Block:  Interscalene brachial plexus block  Pre-Anesthetic Checklist: ,, timeout performed, Correct Patient, Correct Site, Correct Laterality, Correct Procedure, Correct Position, site marked, Risks and benefits discussed,  Surgical consent,  Pre-op evaluation,  At surgeon's request and post-op pain management  Laterality: Upper and Right  Prep: Maximum Sterile Barrier Precautions used and chloraprep       Needles:  Injection technique: Single-shot  Needle Type: Echogenic Stimulator Needle     Needle Length: 10cm 10 cm Needle Gauge: 21 and 21 G    Additional Needles:  Procedures: ultrasound guided (picture in chart) and nerve stimulator Interscalene brachial plexus block  Nerve Stimulator or Paresthesia:  Response: 0.3 mA,   Additional Responses:   Narrative:  Start time: 03/30/2014 8:58 AM End time: 03/30/2014 8:08 AM Injection made incrementally with aspirations every 5 mL.  Performed by: Personally  Anesthesiologist: Alexis Frock  Additional Notes: R IS block with 48ml .5% marcaine with epi, talked to patient throughout, [place], and stim, no compliations, multiple aspirations

## 2014-03-30 NOTE — Anesthesia Postprocedure Evaluation (Signed)
  Anesthesia Post-op Note  Patient: Alan [name]  Procedure(s) Performed: Procedure(s): RIGHT SHOULDER ARTHROSCOPY  OPEN ROTATOR CUFF REPAIR (Right) ROTATOR CUFF REPAIR SHOULDER OPEN (Right)  Patient Location: PACU  Anesthesia Type:General  Level of Consciousness: awake and alert   Airway and Oxygen Therapy: Patient Spontanous Breathing and Patient connected to nasal cannula oxygen  Post-op Pain: none  Post-op Assessment: Post-op Vital signs reviewed, Patient's Cardiovascular Status Stable, Respiratory Function Stable and Patent Airway  Post-op Vital Signs: Reviewed and stable  Last Vitals:  Filed Vitals:   03/30/14 1005  BP: 149/61  Pulse: 60  Temp:   Resp: 21    Complications: No apparent anesthesia complications

## 2014-03-30 NOTE — Progress Notes (Signed)
Dr Tresa Moore in to see pt, informed of pacemaker, no need to contact Medtronic at this time per Dr Tresa Moore.

## 2014-03-30 NOTE — Interval H&P Note (Signed)
History and Physical Interval Note:  03/30/2014 9:35 AM  Alan [name]  has presented today for surgery, with the diagnosis of RIGHT SHOULDER ROTATOR CUFF TEAR AND IMPINGEMENT   The various methods of treatment have been discussed with the patient and family. After consideration of risks, benefits and other options for treatment, the patient has consented to  Procedure(s): RIGHT SHOULDER ARTHROSCOPY WITH POSSIBLE OPEN ROTATOR CUFF REPAIR (Right) ROTATOR CUFF REPAIR SHOULDER OPEN (Right) as a surgical intervention .  The patient's history has been reviewed, patient examined, no change in status, stable for surgery.  I have reviewed the patient's chart and labs.  Questions were answered to the patient's satisfaction.     Kerin Salen

## 2014-03-30 NOTE — Op Note (Signed)
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  Postoperative diagnosis: Same  Procedure: Right shoulder arthroscopic anterior-inferior acromioplasty, distal clavicle excision, and debridement of rotator cuff tear followed by a mini open right shoulder rotator cuff repair using Biomet suture mattress technique with 3 medial all threaded anchors and 3 lateral knotless allthread anchors from Biomet.  Surgeon: Kathalene Frames. Mayer Camel M.D.  First assistant: Leighton Parody PA-C  Anesthetic: Right shoulder interscalene block plus general endotracheal  Estimated blood loss: Minimal  Fluid replacement: 1200 cc of crystalloid.  Indications for procedure: Patient had left shoulder decompression with rotator cuff repair some years ago and now has similar symptoms on the right. At age 74 he may or may not be candidate for cuff repair based on the quality of the tissue. He has chronic pain and weakness that wakes him up at night interferes with activities. He is failed conservative treatment with therapy and exercises anti-inflammatory medicine and cortisone injection. Because of his history of controlled sleep apnea, pacemaker, and coronary artery disease his case is being done at the main hospital. Risks and benefits of surgery been discussed questions answered.  Description of procedure: Patient was identified by arm band and are C. preoperative IV antibiotics in the holding area. He was taken to operating room 4 where the appropriate anesthetic monitors were attached and general endotracheal anesthesia was induced with the patient in the supine position. He was then placed in the beachchair position and the right upper tremor he prepped and draped in the usual sterile fashion from the wrist to the hemithorax. A time out procedure was performed. We began the operation by making standard portals 1.5 cm anterior to the acromion, 1.5 cm lateral to the junction of the middle and posterior  thirds of the acromion, and 1.5 cm posterior the posterior lateral corner of the acromion process. The inflow with gravity was placed anteriorly, the arthroscope laterally, and a 4.2 great-white sucker shaver posteriorly. The subacromial bursa was resected revealing combined supraspinatus and infraspinatus tear which was retracted 2 cm. The tear was debridement back to a stable margin, and shards of the supraspinatus were resected from the greater tuberosity. We then removed the subacromial spur which was type II x 7 mm in size with a 4.200 vortex bur. The a.c. joint was bone-on-bone in the distal clavicle was excised removing 1 cm with a 4.5 hooded vortex bur.. Moving into the glenohumeral joint the articular and the labral cartilages were noted intact as was the biceps and the biceps anchor. There is minimal fraying of the subscapularis. At this point the shoulder was irrigated out normal saline solution and the arthroscopic incisions removed. A mini open approach was then made with a 4-5 cm incision off the anterolateral corner of the acromion through the skin and into the subcutaneous tissue parallel the anterolateral raphae which was then split taking [place] down to the subacromial and subdeltoid space. Arthrex retractor was used exposing the greater tuberosity and the rotator cuff tear. At the junction of the cartilage in the greater tuberosity posteriorly, in the middle, and anteriorly 1 Biomet all threaded anchor with #2 Max braid suture, 4 strands per anchor was inserted into the bone. Using the Biomet suture passer a strands of suture were passed through the rotator cuff tear 1 cm from the edge and tied in pairs. We then placed lateral knotless all threaded anchors 3 posterior, middle, and anterior in the greater tuberosity and brought 4 strands through each lateral anchor.  There was still some cuff tissue anteriorly near the biceps and using suture from the anterior lateral anchor this was repaired with 2  horizontal mattress sutures which were then taken laterally through a fourth Biomet knotless all threaded anchor. This gave [place] a firm watertight repair and allow [place] to take the shoulder through range of motion with no gapping at the repair site. The wound is irrigated out normal saline solution and closed in layers with 3-0 Vicryl subcutaneous tissue and 3-0 Vicryl subcuticular tissue a dressing of Xerofoam 4 x 4 dressing sponges paper tape and a sling was then applied the patient was laid supine awakened extubated and taken to the recovery room.

## 2014-03-30 NOTE — H&P (View-Only) (Signed)
Dr Tresa Moore in to see pt, informed of pacemaker, no need to contact [employer] at this time per Dr Tresa Moore.

## 2014-03-31 ENCOUNTER — Encounter (HOSPITAL_COMMUNITY): Payer: Self-pay | Admitting: Orthopedic Surgery

## 2014-04-10 ENCOUNTER — Ambulatory Visit (INDEPENDENT_AMBULATORY_CARE_PROVIDER_SITE_OTHER): Payer: PPO | Admitting: Cardiovascular Disease

## 2014-04-10 ENCOUNTER — Encounter: Payer: Self-pay | Admitting: Cardiovascular Disease

## 2014-04-10 VITALS — BP 166/75 | HR 67 | Resp 20 | Ht 70.0 in | Wt 236.1 lb

## 2014-04-10 DIAGNOSIS — I2510 Atherosclerotic heart disease of native coronary artery without angina pectoris: Secondary | ICD-10-CM | POA: Diagnosis not present

## 2014-04-10 DIAGNOSIS — I480 Paroxysmal atrial fibrillation: Secondary | ICD-10-CM | POA: Diagnosis not present

## 2014-04-10 DIAGNOSIS — I472 Ventricular tachycardia: Secondary | ICD-10-CM

## 2014-04-10 DIAGNOSIS — I255 Ischemic cardiomyopathy: Secondary | ICD-10-CM

## 2014-04-10 DIAGNOSIS — Z951 Presence of aortocoronary bypass graft: Secondary | ICD-10-CM

## 2014-04-10 DIAGNOSIS — Z7901 Long term (current) use of anticoagulants: Secondary | ICD-10-CM

## 2014-04-10 DIAGNOSIS — Z950 Presence of cardiac pacemaker: Secondary | ICD-10-CM

## 2014-04-10 DIAGNOSIS — G4733 Obstructive sleep apnea (adult) (pediatric): Secondary | ICD-10-CM

## 2014-04-10 DIAGNOSIS — I442 Atrioventricular block, complete: Secondary | ICD-10-CM

## 2014-04-10 DIAGNOSIS — I455 Other specified heart block: Secondary | ICD-10-CM

## 2014-04-10 DIAGNOSIS — E785 Hyperlipidemia, unspecified: Secondary | ICD-10-CM

## 2014-04-10 DIAGNOSIS — I4729 Other ventricular tachycardia: Secondary | ICD-10-CM

## 2014-04-10 NOTE — Patient Instructions (Signed)
Remote monitoring is used to monitor your Pacemaker or ICD from home. This monitoring reduces the number of office visits required to check your device to one time per year. It allows [place] to monitor the functioning of your device to ensure it is working properly. You are scheduled for a device check from home on July 11, 2014. You may send your transmission at any time that day. If you have a wireless device, the transmission will be sent automatically. After your physician reviews your transmission, you will receive a postcard with your next transmission date.  Dr. Sallyanne Kuster recommends that you schedule a follow-up appointment in: 6 months with pacemaker check (GRAY).

## 2014-04-10 NOTE — Progress Notes (Signed)
Patient ID: Alan [name], male   DOB: [birth year], 74 y.o.   MRN: [id number]     Cardiology Office Note   Date:  04/10/2014   ID:  BIRNEY SALUD, DOB 10-29-1940, MRN [id number]  PCP:  [name], MD  Cardiologist:   Sanda Klein, MD   Chief Complaint  Patient presents with  . ROV    patient reports having SOB      History of Present Illness: [name] is a 74 y.o. male who presents for a pacemaker check and follow-up for coronary artery disease and paroxysmal atrial fibrillation and nonsustained ventricular tachycardia.  Pacemaker interrogation shows normal device function with an estimated battery longevity of greater than 10 years and no interval episodes of atrial fibrillation. He paces the atrium virtually 100% of the time ( with good heart rate histogram distribution), but has less than 1% ventricular pacing. He has had a few episodes of nonsustained ventricular tachycardia, all brief and asymptomatic.  He remains off anticoagulation due to extremely low prevalence of paroxysmal atrial fibrillation and recurrent gastrointestinal bleeding requiring repeated transfusions. He does not have a history of stroke or TIA  He has not had angina pectoris or dyspnea. His blood pressure is rather high today, but he is clearly in pain related to recent right shoulder surgery.  He has a longstanding history of cardiac problems. He had CABG in 1990 (sequential LIMA to LAD and diagonal), multiple RCA PCI 1990-2004 and a redo CABG in 2004 (SVG-RCA-PDA, with surgical MAZE procedure, Dr. Amador Cunas). Cath 2007 and 2012 show occluded LAD and RCA with patent grafts, 60% ostial OM and diffuse stenoses distal LCX. 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 He had a generator changeout and ventricular lead revision in January 2014. He does  not tolerate VVI pacing. He has had occasional nonsustained VT recorded by the device, always asymptomatic. He has had paroxysmal atrial fibrillation. No history of stroke or TIA. Warfarin was stopped for recurrent unexplained GI bleeding. He wears CPAP for OSA, has insulin requiring type II DM, mild dyslipidemia intolerant to all statins, post-polio syndrome, lumbar spine surgery 01/2012.  He does not trust nuclear stress tests - reports they were repeatedly wrong in the past. [name] told him he should always have a cath rather than a stress test.  He has had recurrent problems with upper gastrointestinal bleeding. Despite stopping warfarin anticoagulation, he had another episode of GI bleeding requiring transfusion in April 2015. He was seen at [facility] and underwent repeat endoscopy (endoscopic workup in February was negative). He tells me they found "2 small holes in his stomach" which were cauterized. He has not had any bleeding events since. He remains off anti-coagulation.   Past Medical History  Diagnosis Date  . Diabetes mellitus   . A-fib   . Complete heart block   . Coronary artery disease     seen at [facility] cardiology   . Hypertension     Dr. Veronia Beets, primary  . Sleep apnea     cpap, 11, last sleep study Jan2013, sees Dr. Keturah Barre  . Neuromuscular disorder     carpal tunnel bilaterally  . Polio     hx of  . Chronic back pain   . Pacemaker 03/07/2012    replaced  . Status post placement of  cardiac pacemaker, 03/07/12 New Medtronic Gen and new Vent lead original placed 2008 03/08/2012  . Anticoagulant long-term use, on coumadin for PAF 03/08/2012  . Hx of echocardiogram 06/29/2008    EF 45-50%     Past Surgical History  Procedure Laterality Date  . Coronary artery bypass graft  1990  . Stents    . Coronary artery bypass graft  2004  . Maze procedure  2004  . Pacemaker insertion  08, 1/14  . Back surgery    . Cardiac catheterization    . Tonsillectomy     . Appendectomy    . Hernia repair    . Lumbar laminectomy/decompression microdiscectomy  01/10/2012    Procedure: LUMBAR LAMINECTOMY/DECOMPRESSION MICRODISCECTOMY 1 LEVEL;  Surgeon: [name], MD;  Location: [place] NEURO ORS;  Service: Neurosurgery;  Laterality: Bilateral;  Lumbar three-four decompression, Posterior lateral fusion lumbar three-four, posterior spinus plate lumbar three-four  . Insert / replace / remove pacemaker  03/07/2012    replaced  . Joint replacement      Hx: of left knee  . Fracture surgery      Hx: of right leg  . Nasal septum surgery N/A 14  . Posterior fusion lumbar spine  08/28/2012    Dr Ronnald Ramp  . Esophagogastroduodenoscopy Left 03/18/2013    Procedure: ESOPHAGOGASTRODUODENOSCOPY (EGD);  Surgeon: Cleotis Nipper, MD;  Location: [facility] ENDOSCOPY;  Service: Endoscopy;  Laterality: Left;  . Left heart catheterization with coronary/graft angiogram N/A 12/21/2010    Procedure: LEFT HEART CATHETERIZATION WITH Beatrix Fetters;  Surgeon: Leonie Man, MD;  Location: [facility] CATH LAB;  Service: Cardiovascular;  Laterality: N/A;  . Pacemaker revision N/A 03/07/2012    Procedure: PACEMAKER REVISION;  Surgeon: Sanda Klein, MD;  Location: [place] CATH LAB;  Service: Cardiovascular;  Laterality: N/A;  . Shoulder arthroscopy with open rotator cuff repair Right 03/30/2014    Procedure: RIGHT SHOULDER ARTHROSCOPY  OPEN ROTATOR CUFF REPAIR;  Surgeon: Kerin Salen, MD;  Location: [place];  Service: Orthopedics;  Laterality: Right;  . Shoulder open rotator cuff repair Right 03/30/2014    Procedure: ROTATOR CUFF REPAIR SHOULDER OPEN;  Surgeon: Kerin Salen, MD;  Location: [place];  Service: Orthopedics;  Laterality: Right;     Current Outpatient Prescriptions  Medication Sig Dispense Refill  . aspirin 81 MG tablet Take 81 mg by mouth daily.    . carvedilol (COREG) 25 MG tablet Take 2 tablets (50 mg total) by mouth 2 (two) times daily. 360 tablet 3  . Chromium-Cinnamon (CINNAMON  PLUS CHROMIUM PO) Take 1 tablet by mouth 2 (two) times daily.     . Flaxseed, Linseed, 1300 MG CAPS Take 1,300 mg by mouth 2 (two) times daily.     [name] Kitchen glipiZIDE (GLUCOTROL) 10 MG tablet Take 10 mg by mouth daily as needed (for high blood surgars). For high blood sugars    . insulin glargine (LANTUS) 100 UNIT/ML injection Inject 20 Units into the skin daily as needed (elevated blood sugars).     [name] Kitchen lisinopril-hydrochlorothiazide (PRINZIDE,ZESTORETIC) 20-25 MG per tablet Take 1 tablet by mouth daily. 90 tablet 3  . metFORMIN (GLUCOPHAGE) 500 MG tablet Take 500 mg by mouth 4 (four) times daily.    . Multiple Vitamin (MULTIVITAMIN WITH MINERALS) TABS Take 1 tablet by mouth every morning.    . Omega-3 Fatty Acids (FISH OIL) 1200 MG CAPS Take 1,200 mg by mouth daily.    [name] Kitchen oxyCODONE-acetaminophen (ROXICET) 5-325 MG per tablet Take 1 tablet by mouth every  4 (four) hours as needed. 60 tablet 0   No current facility-administered medications for this visit.    Allergies:   Review of patient's allergies indicates no known allergies.    Social History:  The patient  reports that he has never smoked. He has never used smokeless tobacco. He reports that he does not drink alcohol or use illicit drugs.   Family History:  The patient's family history includes Cancer in his mother; Other in his father.    ROS:  Please see the history of present illness.   Otherwise, review of systems are positive for none.   All other systems are reviewed and negative.    PHYSICAL EXAM: VS:  BP 166/75 mmHg  Pulse 67  Resp 20  Ht 5\' 10"  (1.778 m)  Wt 236 lb 1.6 oz (107.094 kg)  BMI 33.88 kg/m2 , BMI Body mass index is 33.88 kg/(m^2). General: Alert, oriented x3, no distress  Head: no evidence of trauma, PERRL, EOMI, no exophtalmos or lid lag, no myxedema, no xanthelasma; normal ears, nose and oropharynx  Neck: normal jugular venous pulsations and no hepatojugular reflux; brisk carotid pulses without delay and no carotid  bruits  Chest: clear to auscultation, no signs of consolidation by percussion or palpation, normal fremitus, symmetrical and full respiratory excursions; sternotomy and pacemaker scars  Cardiovascular: normal position and quality of the apical impulse, regular rhythm, normal first and second heart sounds, no murmurs, rubs or gallops  Abdomen: no tenderness or distention, no masses by palpation, no abnormal pulsatility or arterial bruits, normal bowel sounds, no hepatosplenomegaly  Extremities: Asymmetrical leg atrophy due to childhood polio, no clubbing, cyanosis or edema; 2+ radial, ulnar and brachial pulses bilaterally; 2+ right femoral, posterior tibial and dorsalis pedis pulses; 2+ left femoral, posterior tibial and dorsalis pedis pulses; no subclavian or femoral bruits .  Limited range of motion in right shoulder Neurological: grossly nonfocal   EKG:  EKG is not ordered today.   Recent Labs: 03/26/2014: Platelets 165 03/30/2014: BUN 30*; Creatinine 1.40*; Hemoglobin 14.3; Potassium 4.4; Sodium 138    Lipid Panel    Component Value Date/Time   CHOL 134 12/20/2010 0903   TRIG 104 12/20/2010 0903   HDL 38* 12/20/2010 0903   CHOLHDL 3.5 12/20/2010 0903   VLDL 21 12/20/2010 0903   LDLCALC 75 12/20/2010 0903      Wt Readings from Last 3 Encounters:  04/10/14 236 lb 1.6 oz (107.094 kg)  03/30/14 235 lb (106.595 kg)  01/22/14 234 lb 6.4 oz (106.323 kg)       ASSESSMENT AND PLAN:  Recurrent GI bleeding  It is important to note that his GI bleeding occurred even when he was not anticoagulated. Still reluctant to restart anticoagulants since he has required so many transfusions recently. Since atrial fibrillation has not been detected in a long time will continue with pacemaker monitoring..   Non-sustained ventricular tachycardia  Asymptomatic. Similar to previous pacemaker checks he has infrequent episodes of nonsustained VT, up to 19 beats in duration.  ATRIAL  FIBRILLATION, PAROXYSMAL s/p surgical MAZE  No recent events. No history of stroke/TIA. CHADS Vasc score is 3, HAS-BLED score is 4. Continue to monitor for arrhythmia recurrence.   Ischemic cardiomyopathy  Previously reported to have mildly decreased EF but the echo Jun 25, 2013 showed normal LVEF.   CORONARY ARTERY BYPASS GRAFT, HX OF, in 1990 and Re-do CABG 2004 with Atrial Maze procedure,  No angina or CHF   Dyslipidemia  Statin intolerant.  He does not find the prospect of injectable PCSK9 inhibitors very appealing.   Pacemaker - Medtronic Adapta dual chamber  No meaningful arrhythmia has been recorded. Normal lead and battery parameters. 98% atrial pacing, <1% ventricular pacing  Hypertension His blood pressure is high today,  But he is clearly in a lot of discomfort. Asked him to call me if he has systolic blood pressure greater than 140 on repeated occasions  Since his shoulder pain resolves.   Current medicines are reviewed at length with the patient today.  The patient does not have concerns regarding medicines.  The following changes have been made:  no change  Patient Instructions  Remote monitoring is used to monitor your Pacemaker or ICD from home. This monitoring reduces the number of office visits required to check your device to one time per year. It allows [place] to monitor the functioning of your device to ensure it is working properly. You are scheduled for a device check from home on July 11, 2014. You may send your transmission at any time that day. If you have a wireless device, the transmission will be sent automatically. After your physician reviews your transmission, you will receive a postcard with your next transmission date.  Dr. Sallyanne Kuster recommends that you schedule a follow-up appointment in: 6 months with pacemaker check (GRAY).       Mikael Spray, MD  04/10/2014 10:40 AM    [facility] [address], [place],  [place]  [address] Phone: [phone]; Fax: [phone]

## 2014-04-14 LAB — MDC_IDC_ENUM_SESS_TYPE_REMOTE
Battery Remaining Longevity: 148 mo
Brady Statistic AP VP Percent: 0 %
Brady Statistic AP VS Percent: 99 %
Brady Statistic AS VS Percent: 1 %
Lead Channel Impedance Value: 455 Ohm
Lead Channel Impedance Value: 658 Ohm
Lead Channel Pacing Threshold Amplitude: 0.625 V
Lead Channel Pacing Threshold Pulse Width: 0.4 ms
Lead Channel Pacing Threshold Pulse Width: 0.4 ms
Lead Channel Sensing Intrinsic Amplitude: 5.6 mV
Lead Channel Setting Pacing Amplitude: 1.5 V
MDC IDC MSMT BATTERY IMPEDANCE: 135 Ohm
MDC IDC MSMT BATTERY VOLTAGE: 2.79 V
MDC IDC MSMT LEADCHNL RA PACING THRESHOLD AMPLITUDE: 0.75 V
MDC IDC SESS DTM: 20160304133508
MDC IDC SET LEADCHNL RV PACING AMPLITUDE: 2 V
MDC IDC SET LEADCHNL RV PACING PULSEWIDTH: 0.4 ms
MDC IDC SET LEADCHNL RV SENSING SENSITIVITY: 5.6 mV
MDC IDC STAT BRADY AS VP PERCENT: 0 %

## 2014-05-06 ENCOUNTER — Encounter: Payer: Self-pay | Admitting: Cardiovascular Disease

## 2014-05-29 NOTE — Op Note (Signed)
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   OPERATIONS PERFORMED: 1.  Use of Stryker navigation system.  2.  Endoscopic removal of left nasal mass measuring approximately 1 x 2 cm.  3.  Bilateral endoscopic out-fracture of inferior turbinates with cautery.   OPERATIVE FINDINGS:  1.  A 1 x 2 cm solid mass in the left lateral nasal wall, emanating from the mid-portion of the middle turbinate.  2.  Bilateral inferior turbinate hypertrophy.   DESCRIPTION OF PROCEDURE: The patient was identified in the holding area, taken to the operating room and placed in the supine position. After general endotracheal anesthesia, the table was turned 90 degrees. The Stryker navigation device was applied and calibrated and used throughout the case. The patient was then draped in the usual fashion for endoscopic sinus surgery. A topical anesthetic of phenylephrine with lidocaine was placed into each nostril, and a  cotton pledget; after approximately 5 minutes, this was removed. A local anesthetic of 1% lidocaine with 1:100,000 units of epinephrine was used to inject the inferior turbinates, the left middle turbinate and the left lateral nasal wall. A total of about 4 to 5 mL was used. Stryker navigation was applied and the pledgets removed. A 0-degree endoscope was introduced into the nostril on the left-hand side. There was an obvious firm mass. It did not look like a standard polyp on the left lateral nasal wall. It appeared to be emanating from the mid-portion of the middle turbinate. A 45-degree forceps was used to grasp this from its pedunculated spot on the middle turbinate and it was removed in its entirety. This was  sent for permanent section. Reinspection with the endoscope showed no evidence of residual mass. With this completed, the inferior turbinates were gently out-fractured and the suction cautery was used to cauterize the inferior turbinates, giving excellent reduction of the turbinate hypertrophy. With no evidence of active bleeding, the scope was then used to look around the nasal cavity, and no further mass was identified. Stammberger gel was then placed within each nostril. The patient was then returned to anesthesia where he was extubated in the operating room and taken to the recovery room in stable condition.   CULTURES: None.   SPECIMENS: Left lateral nasal mass.   ESTIMATED BLOOD LOSS: Less than 20 mL.    ____________________________ Roena Malady, MD ctm:dm D: 05/07/2012 11:38:00 ET T: 05/07/2012 11:48:47 ET JOB#: [id number]  cc: Roena Malady, MD, <Dictator> Roena Malady MD ELECTRONICALLY SIGNED 05/10/2012 7:37

## 2014-07-13 ENCOUNTER — Ambulatory Visit (INDEPENDENT_AMBULATORY_CARE_PROVIDER_SITE_OTHER): Payer: PPO | Admitting: *Deleted

## 2014-07-13 DIAGNOSIS — I480 Paroxysmal atrial fibrillation: Secondary | ICD-10-CM

## 2014-07-13 NOTE — Progress Notes (Signed)
Remote pacemaker transmission.   

## 2014-07-17 DIAGNOSIS — K4090 Unilateral inguinal hernia, without obstruction or gangrene, not specified as recurrent: Secondary | ICD-10-CM | POA: Insufficient documentation

## 2014-07-17 DIAGNOSIS — K439 Ventral hernia without obstruction or gangrene: Secondary | ICD-10-CM | POA: Insufficient documentation

## 2014-07-17 LAB — CUP PACEART REMOTE DEVICE CHECK
Battery Impedance: 135 Ohm
Battery Remaining Longevity: 148 mo
Battery Voltage: 2.78 V
Brady Statistic AP VP Percent: 0 %
Brady Statistic AP VS Percent: 99 %
Brady Statistic AS VP Percent: 0 %
Date Time Interrogation Session: 20160606145533
Lead Channel Impedance Value: 474 Ohm
Lead Channel Pacing Threshold Amplitude: 0.625 V
Lead Channel Pacing Threshold Pulse Width: 0.4 ms
Lead Channel Sensing Intrinsic Amplitude: 5.6 mV
Lead Channel Setting Pacing Amplitude: 1.5 V
Lead Channel Setting Sensing Sensitivity: 5.6 mV
MDC IDC MSMT LEADCHNL RA PACING THRESHOLD AMPLITUDE: 0.75 V
MDC IDC MSMT LEADCHNL RV IMPEDANCE VALUE: 485 Ohm
MDC IDC MSMT LEADCHNL RV PACING THRESHOLD PULSEWIDTH: 0.4 ms
MDC IDC SET LEADCHNL RV PACING AMPLITUDE: 2 V
MDC IDC SET LEADCHNL RV PACING PULSEWIDTH: 0.4 ms
MDC IDC STAT BRADY AS VS PERCENT: 0 %

## 2014-07-27 ENCOUNTER — Encounter: Payer: Self-pay | Admitting: Cardiology

## 2014-07-29 ENCOUNTER — Encounter: Payer: Self-pay | Admitting: Cardiovascular Disease

## 2014-08-03 ENCOUNTER — Other Ambulatory Visit: Payer: Self-pay

## 2014-08-03 DIAGNOSIS — R1031 Right lower quadrant pain: Secondary | ICD-10-CM

## 2014-08-03 DIAGNOSIS — R1030 Lower abdominal pain, unspecified: Secondary | ICD-10-CM

## 2014-08-06 ENCOUNTER — Ambulatory Visit
Admission: RE | Admit: 2014-08-06 | Discharge: 2014-08-06 | Disposition: A | Discharge status: Home / Self Care | Payer: PPO | Source: Ambulatory Visit | Attending: General Surgery | Admitting: General Surgery

## 2014-08-06 MED ORDER — IOPAMIDOL (ISOVUE-300) INJECTION 61%
125.0000 mL | Freq: Once | INTRAVENOUS | Status: AC | PRN
  Administered 2014-08-06: 125 mL via INTRAVENOUS

## 2014-08-11 ENCOUNTER — Encounter: Payer: Self-pay | Admitting: Cardiology

## 2014-08-11 DIAGNOSIS — Z8679 Personal history of other diseases of the circulatory system: Secondary | ICD-10-CM | POA: Insufficient documentation

## 2014-10-12 ENCOUNTER — Other Ambulatory Visit: Payer: Self-pay | Admitting: Cardiovascular Disease

## 2014-11-06 ENCOUNTER — Encounter: Payer: Self-pay | Admitting: Cardiovascular Disease

## 2014-11-06 ENCOUNTER — Telehealth: Payer: Self-pay | Admitting: *Deleted

## 2014-11-06 NOTE — Telephone Encounter (Signed)
Surgical clearance faxed to Dr. Tamera Punt.  OK to hold ASA 5-7 days prior to surgery.  Recommended [employer] be there to reprogram his pacer to DOO mode prior to surgery.  Text sent to [facility] with [employer] requesting a rep be present to do the reprogramming 11/10/14.

## 2014-11-09 ENCOUNTER — Telehealth: Payer: Self-pay | Admitting: Cardiovascular Disease

## 2014-11-09 NOTE — Telephone Encounter (Signed)
  Spoke to Vernon at surgical center. She called because to her understanding, patient needed to have PCM reprogramming done prior to surgery, and she was contacting us to see if this could be arranged.  Reviewed clearance recommendations w/ her, explained that recommendation was for reprogramming to be done at surgery as understood in Barbara's note -  would forward to Device clinic to make sure [employer] rep has been contacted. Blanch Media requests a f/u call - can we give her Marcia's or attendant [employer] rep's contact information? I do not have this on-hand.    Tressa Busman, CMA at 11/06/2014 1:59 PM     Status: Signed       Expand All Collapse All   Surgical clearance faxed to Dr. Tamera Punt. OK to hold ASA 5-7 days prior to surgery. Recommended [employer] be there to reprogram his pacer to DOO mode prior to surgery. Text sent to [facility] with [employer] requesting a rep be present to do the reprogramming 11/10/14.

## 2014-11-10 NOTE — Telephone Encounter (Signed)
Tomi Bamberger, [employer] rep, made aware and will contact Blanch Media at surgical center at phone number listed in this note.

## 2014-11-24 ENCOUNTER — Encounter: Payer: Self-pay | Admitting: Cardiovascular Disease

## 2014-11-24 ENCOUNTER — Ambulatory Visit (INDEPENDENT_AMBULATORY_CARE_PROVIDER_SITE_OTHER): Payer: PPO | Admitting: Cardiovascular Disease

## 2014-11-24 VITALS — BP 146/64 | HR 76 | Resp 16 | Ht 70.0 in | Wt 227.5 lb

## 2014-11-24 DIAGNOSIS — I4729 Other ventricular tachycardia: Secondary | ICD-10-CM

## 2014-11-24 DIAGNOSIS — I480 Paroxysmal atrial fibrillation: Secondary | ICD-10-CM

## 2014-11-24 DIAGNOSIS — I455 Other specified heart block: Secondary | ICD-10-CM | POA: Diagnosis not present

## 2014-11-24 DIAGNOSIS — G4733 Obstructive sleep apnea (adult) (pediatric): Secondary | ICD-10-CM

## 2014-11-24 DIAGNOSIS — I472 Ventricular tachycardia: Secondary | ICD-10-CM

## 2014-11-24 DIAGNOSIS — Z951 Presence of aortocoronary bypass graft: Secondary | ICD-10-CM | POA: Diagnosis not present

## 2014-11-24 DIAGNOSIS — I255 Ischemic cardiomyopathy: Secondary | ICD-10-CM

## 2014-11-24 DIAGNOSIS — Z950 Presence of cardiac pacemaker: Secondary | ICD-10-CM

## 2014-11-24 DIAGNOSIS — Z7901 Long term (current) use of anticoagulants: Secondary | ICD-10-CM

## 2014-11-24 NOTE — Patient Instructions (Signed)
Your physician wants you to follow-up in: 1 Year. You will receive a reminder letter in the mail two months in advance. If you don't receive a letter, please call our office to schedule the follow-up appointment.  Remote monitoring is used to monitor your Pacemaker of ICD from home. This monitoring reduces the number of office visits required to check your device to one time per year. It allows [place] to keep an eye on the functioning of your device to ensure it is working properly. You are scheduled for a device check from home on 02/23/2015. You may send your transmission at any time that day. If you have a wireless device, the transmission will be sent automatically. After your physician reviews your transmission, you will receive a postcard with your next transmission date.  If you need a refill on your cardiac medications before your next appointment, please call your pharmacy.

## 2014-11-24 NOTE — Progress Notes (Signed)
Patient ID: Alan [name], male   DOB: [birth year], 74 y.o.   MRN: [id number]     Cardiology Office Note   Date:  11/24/2014   ID:  Alan [name], DOB Sep 30, 1940, MRN [id number]  PCP:  [name], MD  Cardiologist:   Sanda Klein, MD   Chief Complaint  Patient presents with  . Follow-up    6 Months, pt denied chest pain and SOB      History of Present Illness: Alan [name] is a 74 y.o. male who presents for pacemaker and CAD follow up. He is recovering from recent right shoulder surgery.  He has no cardiovascular complaints of angina, palpitations, dizziness or syncope, lower extremity edema, claudication or focal neurological deficits.  Interrogation of his pacemaker shows normal device function. The presenting rhythm was atrial paced ventricular sensed. He has 100% atrial pacing and does not have any underlying atrial rhythm when pacing is taken down to 30 bpm. There is only 0.7% ventricular pacing. His current generator longevity is estimated at 12 years. He has not had any episodes of atrial fibrillation since his last device check.  He has a longstanding history of cardiac problems. He had CABG in 1990 (sequential LIMA to LAD and diagonal), multiple RCA PCI 1990-2004 and a redo CABG in 2004 (SVG-RCA-PDA, with surgical MAZE procedure, Dr. Amador Cunas). Cath 2007 and 2012 show occluded LAD and RCA with patent grafts, 60% ostial OM and diffuse stenoses distal LCX. 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 He had a generator changeout and ventricular lead revision in January 2014. He does not tolerate VVI pacing. He has had occasional nonsustained VT recorded by the device, always asymptomatic. He has had paroxysmal atrial fibrillation. No history of stroke or TIA. Warfarin was stopped for recurrent GI bleeding requiring  transfusions. Despite stopping warfarin anticoagulation, he had another episode of GI bleeding requiring transfusion in April 2015. He was seen at [facility] and underwent repeat endoscopy (endoscopic workup in February was negative). He tells me they found "2 small holes in his stomach" which were cauterized. He has not had any bleeding events since. He remains off anti-coagulation.  He wears CPAP for OSA, has insulin requiring type II DM, mild dyslipidemia intolerant to all statins, post-polio syndrome, lumbar spine surgery 01/2012.  He does not trust nuclear stress tests - reports they were repeatedly wrong in the past. [name] told him he should always have a cath rather than a stress test.   Past Medical History  Diagnosis Date  . Diabetes mellitus   . A-fib   . Complete heart block   . Coronary artery disease     seen at [facility] cardiology   . Hypertension     Dr. Veronia Beets, primary  . Sleep apnea     cpap, 11, last sleep study Jan2013, sees Dr. Keturah Barre  . Neuromuscular disorder     carpal tunnel bilaterally  . Polio     hx of  . Chronic back pain   . Pacemaker 03/07/2012    replaced  . Status post placement of cardiac pacemaker, 03/07/12 New Medtronic Gen and new Vent lead original placed 2008 03/08/2012  . Anticoagulant long-term use, on coumadin for PAF 03/08/2012  . Hx of echocardiogram 06/29/2008    EF 45-50%     Past Surgical  History  Procedure Laterality Date  . Coronary artery bypass graft  1990  . Stents    . Coronary artery bypass graft  2004  . Maze procedure  2004  . Pacemaker insertion  08, 1/14  . Back surgery    . Cardiac catheterization    . Tonsillectomy    . Appendectomy    . Hernia repair    . Lumbar laminectomy/decompression microdiscectomy  01/10/2012    Procedure: LUMBAR LAMINECTOMY/DECOMPRESSION MICRODISCECTOMY 1 LEVEL;  Surgeon: [name], MD;  Location: [place] [facility];  Service: Neurosurgery;  Laterality: Bilateral;  Lumbar  three-four decompression, Posterior lateral fusion lumbar three-four, posterior spinus plate lumbar three-four  . Insert / replace / remove pacemaker  03/07/2012    replaced  . Joint replacement      Hx: of left knee  . Fracture surgery      Hx: of right leg  . Nasal septum surgery N/A 14  . Posterior fusion lumbar spine  08/28/2012    Dr Ronnald Ramp  . Esophagogastroduodenoscopy Left 03/18/2013    Procedure: ESOPHAGOGASTRODUODENOSCOPY (EGD);  Surgeon: Cleotis Nipper, MD;  Location[facility] ENDOSCOPY;  Service: Endoscopy;  Laterality: Left;  . Left heart catheterization with coronary/graft angiogram N/A 12/21/2010    Procedure: LEFT HEART CATHETERIZATION WITH Beatrix Fetters;  Surgeon: Leonie Man, MD;  Location[facility] CATH LAB;  Service: Cardiovascular;  Laterality: N/A;  . Pacemaker revision N/A 03/07/2012    Procedure: PACEMAKER REVISION;  Surgeon: Sanda Klein, MD;  Location: [place] CATH LAB;  Service: Cardiovascular;  Laterality: N/A;  . Shoulder arthroscopy with open rotator cuff repair Right 03/30/2014    Procedure: RIGHT SHOULDER ARTHROSCOPY  OPEN ROTATOR CUFF REPAIR;  Surgeon: Kerin Salen, MD;  Location: [place];  Service: Orthopedics;  Laterality: Right;  . Shoulder open rotator cuff repair Right 03/30/2014    Procedure: ROTATOR CUFF REPAIR SHOULDER OPEN;  Surgeon: Kerin Salen, MD;  Location: [place];  Service: Orthopedics;  Laterality: Right;     Current Outpatient Prescriptions  Medication Sig Dispense Refill  . aspirin 81 MG tablet Take 81 mg by mouth daily.    . carvedilol (COREG) 25 MG tablet TAKE TWO TABLETS BY MOUTH TWICE DAILY 360 tablet 0  . glipiZIDE (GLUCOTROL) 10 MG tablet Take 10 mg by mouth daily as needed (for high blood surgars). For high blood sugars    . hydrochlorothiazide (HYDRODIURIL) 25 MG tablet Take 25 mg by mouth daily.    . insulin glargine (LANTUS) 100 UNIT/ML injection Inject 20 Units into the skin daily as needed (elevated blood sugars).     [name] Kitchen  lisinopril (PRINIVIL,ZESTRIL) 40 MG tablet Take 40 mg by mouth daily.    . metFORMIN (GLUCOPHAGE) 500 MG tablet Take 1 tablet by mouth 4 (four) times daily.    . Multiple Vitamin (MULTIVITAMIN WITH MINERALS) TABS Take 1 tablet by mouth every morning.    . Omega-3 Fatty Acids (FISH OIL) 1200 MG CAPS Take 1,200 mg by mouth daily.     No current facility-administered medications for this visit.    Allergies:   Review of patient's allergies indicates no known allergies.    Social History:  The patient  reports that he has never smoked. He has never used smokeless tobacco. He reports that he does not drink alcohol or use illicit drugs.   Family History:  The patient's family history includes Cancer in his mother; Other in his father.    ROS:  Please see the history of present  illness.    Otherwise, review of systems positive for shoulder discomfort postop .   All other systems are reviewed and negative.    PHYSICAL EXAM: VS:  BP 146/64 mmHg  Pulse 76  Ht 5\' 10"  (1.778 m)  Wt 227 lb 8 oz (103.193 kg)  BMI 32.64 kg/m2 , BMI Body mass index is 32.64 kg/(m^2).  General: Alert, oriented x3, no distress Head: no evidence of trauma, PERRL, EOMI, no exophtalmos or lid lag, no myxedema, no xanthelasma; normal ears, nose and oropharynx Neck: normal jugular venous pulsations and no hepatojugular reflux; brisk carotid pulses without delay and no carotid bruits Chest: clear to auscultation, no signs of consolidation by percussion or palpation, normal fremitus, symmetrical and full respiratory excursions, healthy left subclavian pacemaker site  Cardiovascular: normal position and quality of the apical impulse, regular rhythm, normal first and second heart sounds, no murmurs, rubs or gallops Abdomen: no tenderness or distention, no masses by palpation, no abnormal pulsatility or arterial bruits, normal bowel sounds, no hepatosplenomegaly Extremities: no clubbing, cyanosis or edema; 2+ radial, ulnar and  brachial pulses bilaterally; 2+ right femoral, posterior tibial and dorsalis pedis pulses; 2+ left femoral, posterior tibial and dorsalis pedis pulses; no subclavian or femoral bruits Right arm in sling Neurological: grossly nonfocal Psych: euthymic mood, full affect   EKG:  EKG is ordered today. The ekg ordered today demonstatrial paced, ventricular sensed, chronic right bundle branch block, QRS 154 ms, QTC 474 msRecent Labs: 03/26/2014: Platelets 165 03/30/2014: BUN 30*; Creatinine, Ser 1.40*; Hemoglobin 14.3; Potassium 4.4; Sodium 138    Lipid Panel    Component Value Date/Time   CHOL 134 12/20/2010 0903   TRIG 104 12/20/2010 0903   HDL 38* 12/20/2010 0903   CHOLHDL 3.5 12/20/2010 0903   VLDL 21 12/20/2010 0903   LDLCALC 75 12/20/2010 0903      Wt Readings from Last 3 Encounters:  11/24/14 227 lb 8 oz (103.193 kg)  04/10/14 236 lb 1.6 oz (107.094 kg)  03/30/14 235 lb (106.595 kg)      Other studies Reviewed: ASSESSMENT AND PLAN:  Non-sustained ventricular tachycardia  Asymptomatic. On current device check the burden of nonsustained VT is much lower.  ATRIAL FIBRILLATION, PAROXYSMAL s/p surgical MAZE  No recent events. No history of stroke/TIA. CHADS Vasc score is 38 ( age, vascular disease, diabetes ), HAS-BLED score is 4. Continue to monitor for arrhythmia recurrence.  he will remain off anticoagulation for the time being since has had repeated episodes of upper GI bleeding even when off anticoagulation. However it should be noted that no serious bleeding has been reported in over 12 months now.   Ischemic cardiomyopathy  Previously reported to have mildly decreased EF but the echo Jun 25, 2013 showed normal LVEF.No clinical symptoms of congestive heart failure    CORONARY ARTERY BYPASS GRAFT, HX OF, in 1990 and Re-do CABG 2004 with Atrial Maze procedure,  No angina.   Dyslipidemia  Statin intolerant. He does not find the prospect of injectable PCSK9 inhibitors  very appealing.   Pacemaker - Medtronic Adapta dual chamber  No meaningful arrhythmia has been recorded since last check . Normal lead and battery parameters, virtually 100 % atrial pacing, <1% ventricular pacing  Hypertension His blood pressure is slightly high today,but he  though has pain from his recent surgery. At home his blood pressures consistently normal.    Current medicines are reviewed at length with the patient today.  The patient does not have concerns regarding medicines.  The following changes have been made:  no change  Labs/ tests ordered today include:  Orders Placed This Encounter  Procedures  . EKG 12-Lead    Patient Instructions  Your physician wants you to follow-up in: 1 Year. You will receive a reminder letter in the mail two months in advance. If you don't receive a letter, please call our office to schedule the follow-up appointment.  Remote monitoring is used to monitor your Pacemaker of ICD from home. This monitoring reduces the number of office visits required to check your device to one time per year. It allows [place] to keep an eye on the functioning of your device to ensure it is working properly. You are scheduled for a device check from home on 02/23/2015. You may send your transmission at any time that day. If you have a wireless device, the transmission will be sent automatically. After your physician reviews your transmission, you will receive a postcard with your next transmission date.  If you need a refill on your cardiac medications before your next appointment, please call your pharmacy.        Mikael Spray, MD  11/24/2014 11:01 AM    Sanda Klein, MD, [facility] [phone] office [phone] pager

## 2014-11-25 ENCOUNTER — Encounter: Payer: Self-pay | Admitting: Cardiovascular Disease

## 2014-11-26 LAB — CUP PACEART INCLINIC DEVICE CHECK
Battery Impedance: 159 Ohm
Brady Statistic AP VP Percent: 1 %
Brady Statistic AP VS Percent: 99 %
Brady Statistic AS VS Percent: 0 %
Date Time Interrogation Session: 20161018134651
Implantable Lead Implant Date: 20080208
Implantable Lead Implant Date: 20140130
Implantable Lead Location: 753860
Implantable Lead Model: 5076
Implantable Lead Model: 5076
Lead Channel Impedance Value: 481 Ohm
Lead Channel Impedance Value: 527 Ohm
Lead Channel Pacing Threshold Amplitude: 0.5 V
Lead Channel Pacing Threshold Amplitude: 0.625 V
Lead Channel Pacing Threshold Pulse Width: 0.4 ms
Lead Channel Pacing Threshold Pulse Width: 0.4 ms
Lead Channel Pacing Threshold Pulse Width: 0.4 ms
Lead Channel Setting Pacing Amplitude: 2 V
Lead Channel Setting Pacing Pulse Width: 0.4 ms
MDC IDC LEAD LOCATION: 753859
MDC IDC MSMT BATTERY REMAINING LONGEVITY: 144 mo
MDC IDC MSMT BATTERY VOLTAGE: 2.79 V
MDC IDC MSMT LEADCHNL RA PACING THRESHOLD AMPLITUDE: 0.75 V
MDC IDC MSMT LEADCHNL RA PACING THRESHOLD AMPLITUDE: 0.75 V
MDC IDC MSMT LEADCHNL RV PACING THRESHOLD PULSEWIDTH: 0.4 ms
MDC IDC MSMT LEADCHNL RV SENSING INTR AMPL: 8 mV
MDC IDC SET LEADCHNL RV PACING AMPLITUDE: 2.5 V
MDC IDC SET LEADCHNL RV SENSING SENSITIVITY: 2.8 mV
MDC IDC STAT BRADY AS VP PERCENT: 0 %

## 2014-12-01 ENCOUNTER — Other Ambulatory Visit: Payer: Self-pay | Admitting: Cardiovascular Disease

## 2014-12-01 NOTE — Telephone Encounter (Signed)
Rx request sent to pharmacy.  

## 2014-12-08 ENCOUNTER — Encounter: Payer: Self-pay | Admitting: Cardiovascular Disease

## 2014-12-08 HISTORY — PX: BLEPHAROPLASTY: SUR158

## 2014-12-11 ENCOUNTER — Ambulatory Visit (INDEPENDENT_AMBULATORY_CARE_PROVIDER_SITE_OTHER): Payer: PPO

## 2014-12-11 DIAGNOSIS — Z23 Encounter for immunization: Secondary | ICD-10-CM

## 2014-12-14 DIAGNOSIS — Z23 Encounter for immunization: Secondary | ICD-10-CM | POA: Diagnosis not present

## 2014-12-29 ENCOUNTER — Other Ambulatory Visit: Payer: Self-pay | Admitting: *Deleted

## 2014-12-29 NOTE — Patient Outreach (Signed)
[name]) Care Management  12/29/2014  Alan [name] 1940-09-27 [id number]   RN Health Coach telephone call to patient.  Hipaa compliance verified. Spoke with patient and he is in [name] at airport and requested that if possible could I call later. I will call Patient back next week.  [place] Care Management [phone]

## 2014-12-30 ENCOUNTER — Ambulatory Visit: Payer: PPO | Admitting: Pharmacist Clinician (PhC)/ Clinical Pharmacy Specialist

## 2015-01-01 ENCOUNTER — Other Ambulatory Visit: Payer: Self-pay | Admitting: Cardiovascular Disease

## 2015-01-25 ENCOUNTER — Ambulatory Visit: Payer: Medicare Other | Admitting: Internal Medicine

## 2015-02-07 HISTORY — PX: CATARACT EXTRACTION W/ INTRAOCULAR LENS  IMPLANT, BILATERAL: SHX1307

## 2015-02-09 ENCOUNTER — Other Ambulatory Visit: Payer: Self-pay | Admitting: *Deleted

## 2015-02-09 DIAGNOSIS — M25511 Pain in right shoulder: Secondary | ICD-10-CM | POA: Diagnosis not present

## 2015-02-09 DIAGNOSIS — M25611 Stiffness of right shoulder, not elsewhere classified: Secondary | ICD-10-CM | POA: Diagnosis not present

## 2015-02-09 NOTE — Patient Outreach (Addendum)
[facility]) Care Management  02/09/2015  BURCHARD GATTI [birth year] [id number]  [place] attempted 2nd Tier 3   outreach call to patient.  Patient was busy and requested that I call him back  around 4 pm today.    [place] Care Management [phone]

## 2015-02-09 NOTE — Patient Outreach (Signed)
[facility]) Care Management  02/09/2015  Alan [name] [birth year] [id number]   [place] attempted 3rd tier 3 outreach screening call as patient requested. No answer hipaa compliance voice mail message left.  [place] Care Management [phone]

## 2015-02-11 DIAGNOSIS — M25611 Stiffness of right shoulder, not elsewhere classified: Secondary | ICD-10-CM | POA: Diagnosis not present

## 2015-02-11 DIAGNOSIS — M25511 Pain in right shoulder: Secondary | ICD-10-CM | POA: Diagnosis not present

## 2015-02-15 DIAGNOSIS — M25611 Stiffness of right shoulder, not elsewhere classified: Secondary | ICD-10-CM | POA: Diagnosis not present

## 2015-02-15 DIAGNOSIS — M25511 Pain in right shoulder: Secondary | ICD-10-CM | POA: Diagnosis not present

## 2015-02-16 ENCOUNTER — Encounter: Payer: Self-pay | Admitting: *Deleted

## 2015-02-16 NOTE — Patient Outreach (Signed)
[facility]) Care Management  02/16/2015  Alan [name] [birth year] [id number]   No return call from patient. Patient is eligible for services but unable to contact. Case will be closed at this time. An information packet will be sent to patient.  [place] Care Management [phone]

## 2015-02-17 DIAGNOSIS — M25611 Stiffness of right shoulder, not elsewhere classified: Secondary | ICD-10-CM | POA: Diagnosis not present

## 2015-02-17 DIAGNOSIS — M25511 Pain in right shoulder: Secondary | ICD-10-CM | POA: Diagnosis not present

## 2015-02-22 DIAGNOSIS — L570 Actinic keratosis: Secondary | ICD-10-CM | POA: Diagnosis not present

## 2015-02-22 DIAGNOSIS — J0111 Acute recurrent frontal sinusitis: Secondary | ICD-10-CM | POA: Diagnosis not present

## 2015-02-22 DIAGNOSIS — L309 Dermatitis, unspecified: Secondary | ICD-10-CM | POA: Diagnosis not present

## 2015-02-22 DIAGNOSIS — C44321 Squamous cell carcinoma of skin of nose: Secondary | ICD-10-CM | POA: Diagnosis not present

## 2015-02-22 DIAGNOSIS — C44320 Squamous cell carcinoma of skin of unspecified parts of face: Secondary | ICD-10-CM | POA: Diagnosis not present

## 2015-02-22 DIAGNOSIS — C44319 Basal cell carcinoma of skin of other parts of face: Secondary | ICD-10-CM | POA: Diagnosis not present

## 2015-02-23 ENCOUNTER — Ambulatory Visit (INDEPENDENT_AMBULATORY_CARE_PROVIDER_SITE_OTHER): Payer: PPO | Admitting: *Deleted

## 2015-02-23 ENCOUNTER — Telehealth: Payer: Self-pay | Admitting: Cardiology

## 2015-02-23 DIAGNOSIS — I495 Sick sinus syndrome: Secondary | ICD-10-CM

## 2015-02-23 DIAGNOSIS — M25511 Pain in right shoulder: Secondary | ICD-10-CM | POA: Diagnosis not present

## 2015-02-23 DIAGNOSIS — M25611 Stiffness of right shoulder, not elsewhere classified: Secondary | ICD-10-CM | POA: Diagnosis not present

## 2015-02-23 NOTE — Progress Notes (Signed)
Remote pacemaker transmission.   

## 2015-02-23 NOTE — Telephone Encounter (Signed)
Spoke with pt and reminded pt of remote transmission that is due today. Pt verbalized understanding.   

## 2015-02-24 DIAGNOSIS — M25511 Pain in right shoulder: Secondary | ICD-10-CM | POA: Diagnosis not present

## 2015-02-25 DIAGNOSIS — G4733 Obstructive sleep apnea (adult) (pediatric): Secondary | ICD-10-CM | POA: Diagnosis not present

## 2015-02-26 ENCOUNTER — Encounter: Payer: Self-pay | Admitting: Cardiology

## 2015-02-26 DIAGNOSIS — M25511 Pain in right shoulder: Secondary | ICD-10-CM | POA: Diagnosis not present

## 2015-02-26 DIAGNOSIS — M25611 Stiffness of right shoulder, not elsewhere classified: Secondary | ICD-10-CM | POA: Diagnosis not present

## 2015-02-26 LAB — CUP PACEART REMOTE DEVICE CHECK
Battery Impedance: 183 Ohm
Battery Voltage: 2.79 V
Brady Statistic AP VP Percent: 1 %
Brady Statistic AS VS Percent: 1 %
Date Time Interrogation Session: 20170117191700
Implantable Lead Implant Date: 20140130
Implantable Lead Location: 753859
Implantable Lead Model: 5076
Implantable Lead Model: 5076
Lead Channel Impedance Value: 563 Ohm
Lead Channel Setting Pacing Amplitude: 2 V
Lead Channel Setting Sensing Sensitivity: 2 mV
MDC IDC LEAD IMPLANT DT: 20080208
MDC IDC LEAD LOCATION: 753860
MDC IDC MSMT BATTERY REMAINING LONGEVITY: 123 mo
MDC IDC MSMT LEADCHNL RA IMPEDANCE VALUE: 461 Ohm
MDC IDC SET LEADCHNL RV PACING AMPLITUDE: 2.5 V
MDC IDC SET LEADCHNL RV PACING PULSEWIDTH: 0.4 ms
MDC IDC STAT BRADY AP VS PERCENT: 98 %
MDC IDC STAT BRADY AS VP PERCENT: 0 %

## 2015-03-01 DIAGNOSIS — K921 Melena: Secondary | ICD-10-CM | POA: Diagnosis not present

## 2015-03-01 DIAGNOSIS — M25511 Pain in right shoulder: Secondary | ICD-10-CM | POA: Diagnosis not present

## 2015-03-01 DIAGNOSIS — M25611 Stiffness of right shoulder, not elsewhere classified: Secondary | ICD-10-CM | POA: Diagnosis not present

## 2015-03-03 DIAGNOSIS — D649 Anemia, unspecified: Secondary | ICD-10-CM | POA: Diagnosis not present

## 2015-03-03 DIAGNOSIS — R195 Other fecal abnormalities: Secondary | ICD-10-CM | POA: Diagnosis not present

## 2015-03-03 DIAGNOSIS — K921 Melena: Secondary | ICD-10-CM | POA: Diagnosis not present

## 2015-03-04 DIAGNOSIS — K921 Melena: Secondary | ICD-10-CM | POA: Diagnosis not present

## 2015-03-04 DIAGNOSIS — R195 Other fecal abnormalities: Secondary | ICD-10-CM | POA: Diagnosis not present

## 2015-03-04 DIAGNOSIS — D649 Anemia, unspecified: Secondary | ICD-10-CM | POA: Diagnosis not present

## 2015-03-09 DIAGNOSIS — M25611 Stiffness of right shoulder, not elsewhere classified: Secondary | ICD-10-CM | POA: Diagnosis not present

## 2015-03-09 DIAGNOSIS — M25511 Pain in right shoulder: Secondary | ICD-10-CM | POA: Diagnosis not present

## 2015-03-12 DIAGNOSIS — M25611 Stiffness of right shoulder, not elsewhere classified: Secondary | ICD-10-CM | POA: Diagnosis not present

## 2015-03-12 DIAGNOSIS — M25511 Pain in right shoulder: Secondary | ICD-10-CM | POA: Diagnosis not present

## 2015-03-15 ENCOUNTER — Encounter: Payer: Self-pay | Admitting: Cardiology

## 2015-03-15 DIAGNOSIS — M25511 Pain in right shoulder: Secondary | ICD-10-CM | POA: Diagnosis not present

## 2015-03-15 DIAGNOSIS — M25611 Stiffness of right shoulder, not elsewhere classified: Secondary | ICD-10-CM | POA: Diagnosis not present

## 2015-03-17 DIAGNOSIS — M25611 Stiffness of right shoulder, not elsewhere classified: Secondary | ICD-10-CM | POA: Diagnosis not present

## 2015-03-17 DIAGNOSIS — M25511 Pain in right shoulder: Secondary | ICD-10-CM | POA: Diagnosis not present

## 2015-03-22 DIAGNOSIS — M25611 Stiffness of right shoulder, not elsewhere classified: Secondary | ICD-10-CM | POA: Diagnosis not present

## 2015-03-22 DIAGNOSIS — M25511 Pain in right shoulder: Secondary | ICD-10-CM | POA: Diagnosis not present

## 2015-03-24 DIAGNOSIS — M25611 Stiffness of right shoulder, not elsewhere classified: Secondary | ICD-10-CM | POA: Diagnosis not present

## 2015-03-24 DIAGNOSIS — M25511 Pain in right shoulder: Secondary | ICD-10-CM | POA: Diagnosis not present

## 2015-03-25 DIAGNOSIS — L570 Actinic keratosis: Secondary | ICD-10-CM | POA: Diagnosis not present

## 2015-03-25 DIAGNOSIS — C44329 Squamous cell carcinoma of skin of other parts of face: Secondary | ICD-10-CM | POA: Diagnosis not present

## 2015-03-29 DIAGNOSIS — M25611 Stiffness of right shoulder, not elsewhere classified: Secondary | ICD-10-CM | POA: Diagnosis not present

## 2015-03-29 DIAGNOSIS — M25511 Pain in right shoulder: Secondary | ICD-10-CM | POA: Diagnosis not present

## 2015-03-31 DIAGNOSIS — M25611 Stiffness of right shoulder, not elsewhere classified: Secondary | ICD-10-CM | POA: Diagnosis not present

## 2015-03-31 DIAGNOSIS — M25511 Pain in right shoulder: Secondary | ICD-10-CM | POA: Diagnosis not present

## 2015-04-05 DIAGNOSIS — Z09 Encounter for follow-up examination after completed treatment for conditions other than malignant neoplasm: Secondary | ICD-10-CM | POA: Diagnosis not present

## 2015-04-05 DIAGNOSIS — G5601 Carpal tunnel syndrome, right upper limb: Secondary | ICD-10-CM | POA: Diagnosis not present

## 2015-04-08 DIAGNOSIS — G5601 Carpal tunnel syndrome, right upper limb: Secondary | ICD-10-CM | POA: Diagnosis not present

## 2015-04-19 DIAGNOSIS — Z9889 Other specified postprocedural states: Secondary | ICD-10-CM | POA: Diagnosis not present

## 2015-04-21 DIAGNOSIS — S6411XD Injury of median nerve at wrist and hand level of right arm, subsequent encounter: Secondary | ICD-10-CM | POA: Diagnosis not present

## 2015-04-26 DIAGNOSIS — S6411XD Injury of median nerve at wrist and hand level of right arm, subsequent encounter: Secondary | ICD-10-CM | POA: Diagnosis not present

## 2015-05-10 DIAGNOSIS — Z9889 Other specified postprocedural states: Secondary | ICD-10-CM | POA: Diagnosis not present

## 2015-05-10 DIAGNOSIS — H2513 Age-related nuclear cataract, bilateral: Secondary | ICD-10-CM | POA: Diagnosis not present

## 2015-05-10 DIAGNOSIS — H35363 Drusen (degenerative) of macula, bilateral: Secondary | ICD-10-CM | POA: Diagnosis not present

## 2015-05-10 DIAGNOSIS — E113292 Type 2 diabetes mellitus with mild nonproliferative diabetic retinopathy without macular edema, left eye: Secondary | ICD-10-CM | POA: Diagnosis not present

## 2015-05-10 DIAGNOSIS — E119 Type 2 diabetes mellitus without complications: Secondary | ICD-10-CM | POA: Diagnosis not present

## 2015-05-10 DIAGNOSIS — H35411 Lattice degeneration of retina, right eye: Secondary | ICD-10-CM | POA: Diagnosis not present

## 2015-05-13 DIAGNOSIS — S6411XD Injury of median nerve at wrist and hand level of right arm, subsequent encounter: Secondary | ICD-10-CM | POA: Diagnosis not present

## 2015-05-20 DIAGNOSIS — S6411XD Injury of median nerve at wrist and hand level of right arm, subsequent encounter: Secondary | ICD-10-CM | POA: Diagnosis not present

## 2015-05-25 ENCOUNTER — Encounter: Payer: PPO | Admitting: *Deleted

## 2015-05-25 ENCOUNTER — Telehealth: Payer: Self-pay | Admitting: Cardiology

## 2015-05-25 NOTE — Telephone Encounter (Signed)
LMOVM reminding pt to send remote transmission.   

## 2015-05-26 DIAGNOSIS — S6411XD Injury of median nerve at wrist and hand level of right arm, subsequent encounter: Secondary | ICD-10-CM | POA: Diagnosis not present

## 2015-05-28 ENCOUNTER — Encounter: Payer: Self-pay | Admitting: Cardiology

## 2015-06-14 DIAGNOSIS — S6411XD Injury of median nerve at wrist and hand level of right arm, subsequent encounter: Secondary | ICD-10-CM | POA: Diagnosis not present

## 2015-06-21 DIAGNOSIS — D485 Neoplasm of uncertain behavior of skin: Secondary | ICD-10-CM | POA: Diagnosis not present

## 2015-06-21 DIAGNOSIS — L570 Actinic keratosis: Secondary | ICD-10-CM | POA: Diagnosis not present

## 2015-06-23 DIAGNOSIS — Z9889 Other specified postprocedural states: Secondary | ICD-10-CM | POA: Diagnosis not present

## 2015-07-16 DIAGNOSIS — M75122 Complete rotator cuff tear or rupture of left shoulder, not specified as traumatic: Secondary | ICD-10-CM | POA: Diagnosis not present

## 2015-08-05 DIAGNOSIS — L03115 Cellulitis of right lower limb: Secondary | ICD-10-CM | POA: Diagnosis not present

## 2015-08-06 DIAGNOSIS — E1165 Type 2 diabetes mellitus with hyperglycemia: Secondary | ICD-10-CM | POA: Diagnosis not present

## 2015-08-06 DIAGNOSIS — L03115 Cellulitis of right lower limb: Secondary | ICD-10-CM | POA: Diagnosis not present

## 2015-08-09 DIAGNOSIS — I10 Essential (primary) hypertension: Secondary | ICD-10-CM | POA: Diagnosis not present

## 2015-08-09 DIAGNOSIS — E119 Type 2 diabetes mellitus without complications: Secondary | ICD-10-CM | POA: Diagnosis not present

## 2015-08-09 DIAGNOSIS — L03116 Cellulitis of left lower limb: Secondary | ICD-10-CM | POA: Diagnosis not present

## 2015-08-09 DIAGNOSIS — Z0000 Encounter for general adult medical examination without abnormal findings: Secondary | ICD-10-CM | POA: Diagnosis not present

## 2015-08-19 ENCOUNTER — Ambulatory Visit: Payer: PPO | Attending: Family Medicine | Admitting: Physical Therapy

## 2015-08-19 DIAGNOSIS — R2689 Other abnormalities of gait and mobility: Secondary | ICD-10-CM

## 2015-08-19 DIAGNOSIS — M6281 Muscle weakness (generalized): Secondary | ICD-10-CM | POA: Diagnosis not present

## 2015-08-19 DIAGNOSIS — R2681 Unsteadiness on feet: Secondary | ICD-10-CM | POA: Diagnosis not present

## 2015-08-19 DIAGNOSIS — R42 Dizziness and giddiness: Secondary | ICD-10-CM | POA: Diagnosis not present

## 2015-08-20 NOTE — Therapy (Signed)
[facility] [address] [place], [place], [address] Phone: [phone]   Fax:  [phone]  Physical Therapy Evaluation  Patient Details  Name: Alan [name] MRN: [id number] Date of Birth: 10-01-1940 Referring Provider: Anastasia Pall, MD  Encounter Date: 08/19/2015      PT End of Session - 08/19/15 1340    Visit Number 1   Number of Visits 17   Date for PT Re-Evaluation 10/17/05   Authorization Type Medicare G-code   PT Start Time 1017   PT Stop Time 1102   PT Time Calculation (min) 45 min   Equipment Utilized During Treatment Gait belt   Activity Tolerance Patient tolerated treatment well   Behavior During Therapy [facility] for tasks assessed/performed      Past Medical History  Diagnosis Date  . Diabetes mellitus   . A-fib ([place])   . Complete heart block ([place])   . Coronary artery disease     seen at [facility] cardiology   . Hypertension     Dr. Veronia Beets, primary  . Sleep apnea     cpap, 11, last sleep study Jan2013, sees Dr. Keturah Barre  . Neuromuscular disorder ([place])     carpal tunnel bilaterally  . Polio     hx of  . Chronic back pain   . Pacemaker 03/07/2012    replaced  . Status post placement of cardiac pacemaker, 03/07/12 New Medtronic Gen and new Vent lead original placed 2008 03/08/2012  . Anticoagulant long-term use, on coumadin for PAF 03/08/2012  . Hx of echocardiogram 06/29/2008    EF 45-50%     Past Surgical History  Procedure Laterality Date  . Coronary artery bypass graft  1990  . Stents    . Coronary artery bypass graft  2004  . Maze procedure  2004  . Pacemaker insertion  08, 1/14  . Back surgery    . Cardiac catheterization    . Tonsillectomy    . Appendectomy    . Hernia repair    . Lumbar laminectomy/decompression microdiscectomy  01/10/2012    Procedure: LUMBAR LAMINECTOMY/DECOMPRESSION MICRODISCECTOMY 1 LEVEL;  Surgeon: [name], MD;  Location: [facility];   Service: Neurosurgery;  Laterality: Bilateral;  Lumbar three-four decompression, Posterior lateral fusion lumbar three-four, posterior spinus plate lumbar three-four  . Insert / replace / remove pacemaker  03/07/2012    replaced  . Joint replacement      Hx: of left knee  . Fracture surgery      Hx: of right leg  . Nasal septum surgery N/A 14  . Posterior fusion lumbar spine  08/28/2012    Dr Ronnald Ramp  . Esophagogastroduodenoscopy Left 03/18/2013    Procedure: ESOPHAGOGASTRODUODENOSCOPY (EGD);  Surgeon: Cleotis Nipper, MD;  Location: [facility] ENDOSCOPY;  Service: Endoscopy;  Laterality: Left;  . Left heart catheterization with coronary/graft angiogram N/A 12/21/2010    Procedure: LEFT HEART CATHETERIZATION WITH Beatrix Fetters;  Surgeon: Leonie Man, MD;  Location: [facility] CATH LAB;  Service: Cardiovascular;  Laterality: N/A;  . Pacemaker revision N/A 03/07/2012    Procedure: PACEMAKER REVISION;  Surgeon: Sanda Klein, MD;  Location: [facility] CATH LAB;  Service: Cardiovascular;  Laterality: N/A;  . Shoulder arthroscopy with open rotator cuff repair Right 03/30/2014    Procedure: RIGHT SHOULDER ARTHROSCOPY  OPEN ROTATOR CUFF REPAIR;  Surgeon: Kerin Salen, MD;  Location: [facility];  Service: Orthopedics;  Laterality: Right;  . Shoulder open rotator cuff repair Right 03/30/2014  Procedure: ROTATOR CUFF REPAIR SHOULDER OPEN;  Surgeon: Kerin Salen, MD;  Location: [facility];  Service: Orthopedics;  Laterality: Right;    There were no vitals filed for this visit.       Subjective Assessment - 08/19/15 1031    Subjective This 75yo male has increased incidents of falls recently. During physician check-up he was referred to PT for balance issues. He presents for PT evaluation.    Patient is accompained by: Family member   Pertinent History polio (pt reports RLE weakness & leg length discrepency as result & no diagnosis of post-polio by any physician), DM, A-Fib, complete heart block, EF 45-50%, CAD, HTN,  LBP, sleep apnea, Bil carpal tunnel, pacemaker, CABG, Back surgery lumbar lamenctomy 01/10/12 & fusion 08/28/12, hernia, bil. rotator cuff tear with right repair   Limitations Lifting;Standing;Walking   Patient Stated Goals Not be dizzy & be able to balance.    Currently in Pain? No/denies            [facility] PT Assessment - 08/19/15 1015    Assessment   Medical Diagnosis Balance Issues   Referring Provider Anastasia Pall, MD   Onset Date/Surgical Date 08/16/05  MD visit with PT referral   Hand Dominance Right   Precautions   Precautions Fall;ICD/Pacemaker   Balance Screen   Has the patient fallen in the past 6 months Yes   How many times? 4-5   Has the patient had a decrease in activity level because of a fear of falling?  Yes   Is the patient reluctant to leave their home because of a fear of falling?  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, mowing   Observation/Other Assessments   Focus on Therapeutic Outcomes (FOTO)  56.86 Functional Status  42 Risk Adjusted   Activities of Balance Confidence Scale (ABC Scale)  42.5%   Fear Avoidance Belief Questionnaire (FABQ)  58 (15)   Posture/Postural Control   Posture/Postural Control Postural limitations   Postural Limitations Rounded Shoulders;Forward head;Weight shift left   ROM / Strength   AROM / PROM / Strength AROM;Strength   AROM   Overall AROM  Within functional limits for tasks performed;Deficits   Overall AROM  Comments right ankle is fused surgically   Strength   Overall Strength Deficits   Strength Assessment Site Hip;Knee;Ankle   Right/Left Hip Right;Left   Right Hip Flexion 5/5   Right Hip Extension 3/5   Right Hip ABduction 3+/5   Left Hip Flexion 5/5   Left Hip Extension 4-/5   Left Hip ABduction 4/5   Right/Left Knee Right;Left   Right Knee Flexion 4/5   Right Knee Extension 5/5   Left Knee Flexion 4/5   Left Knee Extension 5/5   Right/Left Ankle Left   Left Ankle Dorsiflexion 5/5   Transfers   Transfers Sit to Stand;Stand to Sit   Sit to Stand 5: Supervision;With upper extremity assist;With armrests;From chair/3-in-1  Stand to Sit 5: Supervision;With upper extremity assist;With armrests;To chair/3-in-1   Ambulation/Gait   Ambulation/Gait Yes   Ambulation/Gait Assistance 5: Supervision   Ambulation Distance (Feet) 200 Feet   Assistive device None   Gait Pattern Step-through pattern;Decreased arm swing - right;Decreased stance time - right;Right hip hike;Trunk flexed;Wide base of support   Ambulation Surface Indoor;Level   Gait velocity 1.59 ft/sec   Stairs Yes   Stairs Assistance 5: Supervision   Stair Management Technique Two rails;Step to pattern;Forwards   Standardized Balance Assessment   Standardized Balance Assessment Berg Balance Test;Dynamic Gait Index   Berg Balance Test   Sit to Stand Able to stand  independently using hands   Standing Unsupported Able to stand safely 2 minutes   Sitting with Back Unsupported but Feet Supported on Floor or Stool Able to sit safely and securely 2 minutes   Stand to Sit Controls descent by using hands   Transfers Able to transfer safely, minor use of hands   Standing Unsupported with Eyes Closed Able to stand 10 seconds with supervision   Standing Ubsupported with Feet Together Able to place feet together independently but unable to hold for 30 seconds   From Standing, Reach Forward with Outstretched Arm Reaches forward but needs  supervision   From Standing Position, Pick up Object from Floor Able to pick up shoe, needs supervision   From Standing Position, Turn to Look Behind Over each Shoulder Looks behind one side only/other side shows less weight shift   Turn 360 Degrees Able to turn 360 degrees safely but slowly   Standing Unsupported, Alternately Place Feet on Step/Stool Able to complete >2 steps/needs minimal assist   Standing Unsupported, One Foot in Front Able to take small step independently and hold 30 seconds   Standing on One Leg Tries to lift leg/unable to hold 3 seconds but remains standing independently   Total Score 36   Dynamic Gait Index   Level Surface Mild Impairment   Change in Gait Speed Moderate Impairment   Gait with Horizontal Head Turns Moderate Impairment   Gait with Vertical Head Turns Moderate Impairment   Gait and Pivot Turn Mild Impairment   Step Over Obstacle Moderate Impairment   Step Around Obstacles Mild Impairment   Steps Moderate Impairment   Total Score 11            Vestibular Assessment - 08/19/15 1015    Symptom Behavior   Type of Dizziness Lightheadedness   Frequency of Dizziness 1-2 x/wk   Duration of Dizziness 5-10 sec   Aggravating Factors Looking up to the ceiling;Walking in a crowd;Supine to sit;Sit to stand;Forward bending   Relieving Factors Rest   Occulomotor Exam   Occulomotor Alignment Normal   Head shaking Horizontal Absent   Head Shaking Vertical Absent   Smooth Pursuits Intact                         PT Short Term Goals - 08/19/15 1356    PT SHORT TERM GOAL #1   Title Patient is independent in initial HEP. (Target Date: 09/17/2015)   Time 4   Period Weeks   Status New   PT SHORT TERM GOAL #2   Title Berg Balance >/= 40/56 to indicate lower fall risk. (Target Date: 09/17/2015)   Time 4   Period Weeks   Status New   PT SHORT TERM GOAL #3   Title Patient is able to ambulate while scanning environment  without balance  losses.(Target Date: 09/17/2015)   Time 4   Period Weeks   Status New           PT Long Term Goals - 09/01/15 1352    PT LONG TERM GOAL #1   Title Patient is independent in ongoing HEP / fitness plan. (Target Date: 10/15/2015)   Time 8   Period Weeks   Status New   PT LONG TERM GOAL #2   Title Berg Balance >45/56 to indicate lower fall risk. (Target Date: 10/15/2015)   Time 4   Period Weeks   Status New   PT LONG TERM GOAL #3   Title Dynamic Gait Index >17/24 to indicate lower fall risk. (Target Date: 10/15/2015)   Time 8   Period Weeks   Status New   PT LONG TERM GOAL #4   Title Activities of Balance Confidence improves >10% to indicate improved confidence. (Target Date: 10/15/2015)   Time 8   Period Weeks   Status New   PT LONG TERM GOAL #5   Title Patient reports no falls in last 2 weeks. (Target Date: 10/15/2015)   Time 8   Period Weeks   Status New               Plan - 09-01-15 1341    Clinical Impression Statement This 75yo male with history of polio, back surgeries and cardiac issues has had recent decline in balance and strength resulting in multiple balance losses & falls. He self reports using FOTO fear and decreased confidence in balance. His Berg Balance of 36/56 indicates high fall risk. His gait indicates fall risk also including gait velocity of 1.59 ft/sec and Dynamic Gait Index 11/24. Patient also reports dizziness with some movements and he reported it is a factor in his falls. Patient's condition is evolving & plan of care is complex.    Rehab Potential Good   PT Frequency 2x / week   PT Duration 8 weeks   PT Treatment/Interventions ADLs/Self Care Home Management;DME Instruction;Gait training;Stair training;Functional mobility training;Therapeutic activities;Therapeutic exercise;Balance training;Neuromuscular re-education;Patient/family education   PT Next Visit Plan Assess vestibular system further, initiate HEP with balance, strength, vestibular    Consulted and Agree with Plan of Care Patient;Family member/caregiver   Family Member Consulted wife, Lelon Frohlich      Patient will benefit from skilled therapeutic intervention in order to improve the following deficits and impairments:  Abnormal gait, Decreased activity tolerance, Decreased balance, Decreased mobility, Decreased strength, Dizziness, Postural dysfunction  Visit Diagnosis: Dizziness and giddiness  Unsteadiness on feet  Other abnormalities of gait and mobility  Muscle weakness (generalized)      G-Codes - September 01, 2015 1350    Functional Assessment Tool Used Berg Balance 36/56 and Dynamic Gait Index 11/24   Functional Limitation Mobility: Walking and moving around   Mobility: Walking and Moving Around Current Status [phone]) At least 40 percent but less than 60 percent impaired, limited or restricted   Mobility: Walking and Moving Around Goal Status [phone]) At least 1 percent but less than 20 percent impaired, limited or restricted       Problem List Patient Active Problem List   Diagnosis Date Noted  . GI bleed 03/19/2013  . Supratherapeutic INR 03/17/2013  . Melena 03/17/2013  . CHB (complete heart block) ([place]) 10/13/2012  . Cough 10/03/2012  . Dyslipidemia 07/11/2012  . Preop cardiovascular exam 07/11/2012  . Ischemic cardiomyopathy 07/11/2012  . Pacemaker - Medtronic Adapta dual chamber 03/08/2012  . Anticoagulant long-term use,  on coumadin for PAF 03/08/2012  . Non-sustained ventricular tachycardia ([place]) 12/21/2010  . Hypertension 12/21/2010  . Sinus arrest   . DYSPNEA ON EXERTION 12/23/2009  . 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 DIABETES MELLITUS 12/12/2007  . Obstructive sleep apnea 12/12/2007  . Coronary atherosclerosis 12/12/2007  . ATRIAL FIBRILLATION, PAROXYSMAL 12/12/2007  . CORONARY ARTERY BYPASS GRAFT, HX OF, in 1990 and Re-do CABG 2004 with Atrial Maze procedure,  12/12/2007    Jalesia Loudenslager PT, DPT 08/20/2015, 2:00 PM  [facility] [address] [place], [place], [address] Phone: [phone]   Fax:  [phone]  Name: IAAN KRABILL MRN: [id number] Date of Birth: 04-Nov-1940

## 2015-08-26 ENCOUNTER — Ambulatory Visit: Payer: PPO | Admitting: Rehabilitative and Restorative Service Providers"

## 2015-08-26 DIAGNOSIS — R42 Dizziness and giddiness: Secondary | ICD-10-CM

## 2015-08-26 DIAGNOSIS — R2681 Unsteadiness on feet: Secondary | ICD-10-CM

## 2015-08-26 DIAGNOSIS — M6281 Muscle weakness (generalized): Secondary | ICD-10-CM

## 2015-08-26 DIAGNOSIS — R2689 Other abnormalities of gait and mobility: Secondary | ICD-10-CM

## 2015-08-26 NOTE — Patient Instructions (Signed)
Gaze Stabilization: Tip Card 1.Target must remain in focus, not blurry, and appear stationary while head is in motion. 2.Perform exercises with small head movements (45 to either side of midline). 3.Increase speed of head motion so long as target is in focus. 4.If you wear eyeglasses, be sure you can see target through lens (therapist will give specific instructions for bifocal / progressive lenses). 5.These exercises may provoke dizziness or nausea. Work through these symptoms. If too dizzy, slow head movement slightly. Rest between each exercise. 6.Exercises demand concentration; avoid distractions. 7.For safety, perform standing exercises close to a counter, wall, corner, or next to someone.  Copyright  VHI. All rights reserved.  Gaze Stabilization: Standing Feet Apart   Feet shoulder width apart, keeping eyes on target on wall 3 feet away, tilt head down slightly and move head side to side for 30 seconds. Repeat while moving head up and down for 30 seconds. Do 2 sessions per day.   Copyright  VHI. All rights reserved.   Feet Apart (Compliant Surface) Varied Arm Positions - Eyes Closed    Stand on compliant surface: __pillow______ with feet shoulder width apart and arms out. Close eyes and visualize upright position. Hold_30___ seconds. Repeat __3__ times per session. Do __2__ sessions per day. *Have a chair in front of you for support. Copyright  VHI. All rights reserved.   Feet Together (Compliant Surface) Head Motion - Eyes Open    With eyes open, standing on compliant surface: __pillow______, feet together, move head slowly side to side. Repeat __10__ times per session. Do __2__ sessions per day.  Copyright  VHI. All rights reserved.   SINGLE LIMB STANCE    Stance: single leg on floor. Raise leg. Hold _10__ seconds. Repeat with other leg. _5__ reps per set.  Repeat 2 times per day. *Hold onto countertop intermittently with hands as needed.  Copyright  VHI.  All rights reserved.   Backward Walking    Walk backward, toes of each foot coming down first. Take long, even strides. Make sure you have a clear pathway with no obstructions when you do this. In hallway near wall for support.   Copyright  VHI. All rights reserved.   Functional Quadriceps: Sit to Stand    Sit on edge of chair, feet flat on floor. Stand upright, extending knees fully. Repeat __10__ times per set. Do __2__ sets per session. Do __2__ sessions per day.  http://orth.exer.us/734   Copyright  VHI. All rights reserved.

## 2015-08-27 NOTE — Therapy (Signed)
[facility] [address] [place], [place], [address] Phone: [phone]   Fax:  [phone]  Physical Therapy Treatment  Patient Details  Name: Alan [name] MRN: [id number] Date of Birth: [birth year] Referring Provider: Anastasia Pall, MD  Encounter Date: 08/26/2015      PT End of Session - 08/26/15 1010    Visit Number 2   Number of Visits 17   Date for PT Re-Evaluation 10/17/05   Authorization Type Medicare G-code   PT Start Time [id number]   PT Stop Time 1445   PT Time Calculation (min) 40 min   Equipment Utilized During Treatment Gait belt   Activity Tolerance Patient tolerated treatment well   Behavior During Therapy [facility] for tasks assessed/performed      Past Medical History  Diagnosis Date  . Diabetes mellitus   . A-fib ([place])   . Complete heart block ([place])   . Coronary artery disease     seen at [facility] cardiology   . Hypertension     Dr. Veronia Beets, primary  . Sleep apnea     cpap, 11, last sleep study Jan2013, sees Dr. Keturah Barre  . Neuromuscular disorder ([place])     carpal tunnel bilaterally  . Polio     hx of  . Chronic back pain   . Pacemaker 03/07/2012    replaced  . Status post placement of cardiac pacemaker, 03/07/12 New Medtronic Gen and new Vent lead original placed 2008 03/08/2012  . Anticoagulant long-term use, on coumadin for PAF 03/08/2012  . Hx of echocardiogram 06/29/2008    EF 45-50%     Past Surgical History  Procedure Laterality Date  . Coronary artery bypass graft  1990  . Stents    . Coronary artery bypass graft  2004  . Maze procedure  2004  . Pacemaker insertion  08, 1/14  . Back surgery    . Cardiac catheterization    . Tonsillectomy    . Appendectomy    . Hernia repair    . Lumbar laminectomy/decompression microdiscectomy  01/10/2012    Procedure: LUMBAR LAMINECTOMY/DECOMPRESSION MICRODISCECTOMY 1 LEVEL;  Surgeon: [name], MD;  Location: [place] NEURO ORS;   Service: Neurosurgery;  Laterality: Bilateral;  Lumbar three-four decompression, Posterior lateral fusion lumbar three-four, posterior spinus plate lumbar three-four  . Insert / replace / remove pacemaker  03/07/2012    replaced  . Joint replacement      Hx: of left knee  . Fracture surgery      Hx: of right leg  . Nasal septum surgery N/A 14  . Posterior fusion lumbar spine  08/28/2012    Dr Ronnald Ramp  . Esophagogastroduodenoscopy Left 03/18/2013    Procedure: ESOPHAGOGASTRODUODENOSCOPY (EGD);  Surgeon: Cleotis Nipper, MD;  Location[facility] ENDOSCOPY;  Service: Endoscopy;  Laterality: Left;  . Left heart catheterization with coronary/graft angiogram N/A 12/21/2010    Procedure: LEFT HEART CATHETERIZATION WITH Beatrix Fetters;  Surgeon: Leonie Man, MD;  Location: [facility] CATH LAB;  Service: Cardiovascular;  Laterality: N/A;  . Pacemaker revision N/A 03/07/2012    Procedure: PACEMAKER REVISION;  Surgeon: Sanda Klein, MD;  Location: [facility] CATH LAB;  Service: Cardiovascular;  Laterality: N/A;  . Shoulder arthroscopy with open rotator cuff repair Right 03/30/2014    Procedure: RIGHT SHOULDER ARTHROSCOPY  OPEN ROTATOR CUFF REPAIR;  Surgeon: Kerin Salen, MD;  Location: [place];  Service: Orthopedics;  Laterality: Right;  . Shoulder open rotator cuff repair Right 03/30/2014  Procedure: ROTATOR CUFF REPAIR SHOULDER OPEN;  Surgeon: Kerin Salen, MD;  Location: [place];  Service: Orthopedics;  Laterality: Right;    There were no vitals filed for this visit.      Subjective Assessment - 08/26/15 1404    Subjective "I'm tired of this".  The patient reports difficulty with walking x the past 2 years.  He also reports h/o intermittent positional vertigo.  "It is getting more, where I have to hold onto something."   Patient Stated Goals Not be dizzy & be able to balance.    Currently in Pain? No/denies                Vestibular Assessment - 08/26/15 1406    Vestibular Assessment   General  Observation The patient walks into clinic holding wall with R hand.   Occulomotor Exam   Occulomotor Alignment Normal   Spontaneous Absent   Gaze-induced Absent   Vestibulo-Occular Reflex   VOR 1 Head Only (x 1 viewing) patient has retinal slip noted with slow gaze activities   Positional Testing   Sidelying Test Sidelying Right;Sidelying Left   Horizontal Canal Testing Horizontal Canal Right;Horizontal Canal Left   Sidelying Right   Sidelying Right Duration none   Sidelying Right Symptoms No nystagmus   Sidelying Left   Sidelying Left Duration none   Sidelying Left Symptoms No nystagmus   Horizontal Canal Right   Horizontal Canal Right Duration none   Horizontal Canal Right Symptoms Normal   Horizontal Canal Left   Horizontal Canal Left Duration none   Horizontal Canal Left Symptoms Normal           [facility] Adult PT Treatment/Exercise - 08/26/15 1422    Ambulation/Gait   Ambulation/Gait Yes   Gait Comments gait emphasizing increased speed of movement, forward/backwards walking along countertop.   Neuro Re-ed    Neuro Re-ed Details  corner balance exercises including pillow standing with eyes open and head motion, eyes closed.  Single leg stance activities near countertop for intermittent UE support.  Sidestepping x 10 feet x 4 reps without UE suport, turning in place working on balance and visual fixation.            PT Education - 08/27/15 1010    Education provided Yes   Education Details HEP: gaze x 1 viewing in standing, pillow with eyes closed, pillow with head motion, sit<>stand, backwards walking, single leg stance   Person(s) Educated Patient   Methods Explanation;Demonstration;Handout   Comprehension Verbalized understanding;Returned demonstration          PT Short Term Goals - 08/27/15 1011    PT SHORT TERM GOAL #1   Title Patient is independent in initial HEP. (Target Date: 09/17/2015)   Time 4   Period Weeks   Status On-going   PT SHORT TERM GOAL #2    Title Oceanographer >/= 40/56 to indicate lower fall risk. (Target Date: 09/17/2015)   Time 4   Period Weeks   Status On-going   PT SHORT TERM GOAL #3   Title Patient is able to ambulate while scanning environment without balance losses.(Target Date: 09/17/2015)   Time 4   Period Weeks   Status On-going           PT Long Term Goals - 08/27/15 1011    PT LONG TERM GOAL #1   Title Patient is independent in ongoing HEP / fitness plan. (Target Date: 10/15/2015)   Time 8   Period Weeks  Status On-going   PT LONG TERM GOAL #2   Title Berg Balance >45/56 to indicate lower fall risk. (Target Date: 10/15/2015)   Time 4   Period Weeks   Status On-going   PT LONG TERM GOAL #3   Title Dynamic Gait Index >17/24 to indicate lower fall risk. (Target Date: 10/15/2015)   Time 8   Period Weeks   Status On-going   PT LONG TERM GOAL #4   Title Activities of Balance Confidence improves >10% to indicate improved confidence. (Target Date: 10/15/2015)   Time 8   Period Weeks   Status On-going   PT LONG TERM GOAL #5   Title Patient reports no falls in last 2 weeks. (Target Date: 10/15/2015)   Time 8   Period Weeks   Status On-going               Plan - 08/27/15 1011    Clinical Impression Statement The patient tolerated HEP well today.  In PT, we also focused on dec'ing dependence on external support from walls, as patient gets feedback through fingertips.  Patient to initiate HEP and will review.  Continue working to [employer].    PT Treatment/Interventions ADLs/Self Care Home Management;DME Instruction;Gait training;Stair training;Functional mobility training;Therapeutic activities;Therapeutic exercise;Balance training;Neuromuscular re-education;Patient/family education   PT Next Visit Plan Check HEP, dynamic gait, progress gaze   Consulted and Agree with Plan of Care Patient      Patient will benefit from skilled therapeutic intervention in order to improve the following deficits and  impairments:  Abnormal gait, Decreased activity tolerance, Decreased balance, Decreased mobility, Decreased strength, Dizziness, Postural dysfunction  Visit Diagnosis: Dizziness and giddiness  Unsteadiness on feet  Other abnormalities of gait and mobility  Muscle weakness (generalized)     Problem List Patient Active Problem List   Diagnosis Date Noted  . GI bleed 03/19/2013  . Supratherapeutic INR 03/17/2013  . Melena 03/17/2013  . CHB (complete heart block) ([place]) 10/13/2012  . Cough 10/03/2012  . Dyslipidemia 07/11/2012  . Preop cardiovascular exam 07/11/2012  . Ischemic cardiomyopathy 07/11/2012  . Pacemaker - Medtronic Adapta dual chamber 03/08/2012  . Anticoagulant long-term use, on coumadin for PAF 03/08/2012  . Non-sustained ventricular tachycardia ([place]) 12/21/2010  . Hypertension 12/21/2010  . Sinus arrest   . DYSPNEA ON EXERTION 12/23/2009  . 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 DIABETES MELLITUS 12/12/2007  . Obstructive sleep apnea 12/12/2007  . Coronary atherosclerosis 12/12/2007  . ATRIAL FIBRILLATION, PAROXYSMAL 12/12/2007  . CORONARY ARTERY BYPASS GRAFT, HX OF, in 1990 and Re-do CABG 2004 with Atrial Maze procedure,  12/12/2007    [place], PT 08/27/2015, 10:12 AM  [facility] [address] [place], [place], [address] Phone: [phone]   Fax:  [phone]  Name: Alan [name] MRN: [id number] Date of Birth: 05/03/1940

## 2015-09-01 ENCOUNTER — Ambulatory Visit: Payer: PPO | Admitting: Rehabilitative and Restorative Service Providers"

## 2015-09-01 DIAGNOSIS — R2681 Unsteadiness on feet: Secondary | ICD-10-CM

## 2015-09-01 DIAGNOSIS — M6281 Muscle weakness (generalized): Secondary | ICD-10-CM

## 2015-09-01 DIAGNOSIS — R42 Dizziness and giddiness: Secondary | ICD-10-CM

## 2015-09-01 DIAGNOSIS — R2689 Other abnormalities of gait and mobility: Secondary | ICD-10-CM

## 2015-09-02 NOTE — Therapy (Signed)
[facility] [address] [place], [place], [address] Phone: [phone]   Fax:  [phone]  Physical Therapy Treatment  Patient Details  Name: Alan [name] MRN: [id number] Date of Birth: 1940-05-30 Referring Provider: Anastasia Pall, MD  Encounter Date: 09/01/2015      PT End of Session - 09/01/15 1000    Visit Number 3   Number of Visits 17   Date for PT Re-Evaluation 10/17/05   Authorization Type Medicare G-code   PT Start Time 0935   PT Stop Time 1015   PT Time Calculation (min) 40 min   Equipment Utilized During Treatment Gait belt   Activity Tolerance Patient tolerated treatment well   Behavior During Therapy [facility] for tasks assessed/performed      Past Medical History:  Diagnosis Date  . A-fib ([place])   . Anticoagulant long-term use, on coumadin for PAF 03/08/2012  . Chronic back pain   . Complete heart block ([place])   . Coronary artery disease    seen at [facility] cardiology   . Diabetes mellitus   . Hx of echocardiogram 06/29/2008   EF 45-50%   . Hypertension    Dr. Veronia Beets, primary  . Neuromuscular disorder ([place])    carpal tunnel bilaterally  . 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 Polio    hx of  . Sleep apnea    cpap, 11, last sleep study Jan2013, sees Dr. Keturah Barre  . Status post placement of cardiac pacemaker, 03/07/12 New Medtronic Gen and new Vent lead original placed 2008 03/08/2012    Past Surgical History:  Procedure Laterality Date  . APPENDECTOMY    . BACK SURGERY    . CARDIAC CATHETERIZATION    . CORONARY ARTERY BYPASS GRAFT  1990  . CORONARY ARTERY BYPASS GRAFT  2004  . ESOPHAGOGASTRODUODENOSCOPY Left 03/18/2013   Procedure: ESOPHAGOGASTRODUODENOSCOPY (EGD);  Surgeon: Cleotis Nipper, MD;  Location: [facility] ENDOSCOPY;  Service: Endoscopy;  Laterality: Left;  . FRACTURE SURGERY     Hx: of right leg  . HERNIA REPAIR    . INSERT / REPLACE / REMOVE PACEMAKER  03/07/2012    replaced  . JOINT REPLACEMENT     Hx: of left knee  . LEFT HEART CATHETERIZATION WITH CORONARY/GRAFT ANGIOGRAM N/A 12/21/2010   Procedure: LEFT HEART CATHETERIZATION WITH Beatrix Fetters;  Surgeon: Leonie Man, MD;  Location: [facility] CATH LAB;  Service: Cardiovascular;  Laterality: N/A;  . LUMBAR LAMINECTOMY/DECOMPRESSION MICRODISCECTOMY  01/10/2012   Procedure: LUMBAR LAMINECTOMY/DECOMPRESSION MICRODISCECTOMY 1 LEVEL;  Surgeon: [name], MD;  Location: [place] NEURO ORS;  Service: Neurosurgery;  Laterality: Bilateral;  Lumbar three-four decompression, Posterior lateral fusion lumbar three-four, posterior spinus plate lumbar three-four  . MAZE Procedure  2004  . NASAL SEPTUM SURGERY N/A 14  . PACEMAKER INSERTION  08, 1/14  . PACEMAKER REVISION N/A 03/07/2012   Procedure: PACEMAKER REVISION;  Surgeon: Sanda Klein, MD;  Location: [place] CATH LAB;  Service: Cardiovascular;  Laterality: N/A;  . POSTERIOR FUSION LUMBAR SPINE  08/28/2012   Dr Ronnald Ramp  . SHOULDER ARTHROSCOPY WITH OPEN ROTATOR CUFF REPAIR Right 03/30/2014   Procedure: RIGHT SHOULDER ARTHROSCOPY  OPEN ROTATOR CUFF REPAIR;  Surgeon: Kerin Salen, MD;  Location: [facility];  Service: Orthopedics;  Laterality: Right;  . SHOULDER OPEN ROTATOR CUFF REPAIR Right 03/30/2014   Procedure: ROTATOR CUFF REPAIR SHOULDER OPEN;  Surgeon: Kerin Salen, MD;  Location: [facility];  Service: Orthopedics;  Laterality: Right;  . STENTS    .  TONSILLECTOMY      There were no vitals filed for this visit.      Subjective Assessment - 09/01/15 0939    Subjective The patient reports he is doing HEP.  He has tightness in neck that is limiting ROM.     Patient Stated Goals Not be dizzy & be able to balance.         NEUROMUSCULAR RE-EDUCATION: Berg=49/56     [facility] PT Assessment - 09/01/15 1001      Standardized Balance Assessment   Standardized Balance Assessment Berg Balance Test     Berg Balance Test   Sit to Stand Able to stand without using hands  and stabilize independently   Standing Unsupported Able to stand safely 2 minutes   Sitting with Back Unsupported but Feet Supported on Floor or Stool Able to sit safely and securely 2 minutes   Stand to Sit Sits safely with minimal use of hands   Transfers Able to transfer safely, minor use of hands   Standing Unsupported with Eyes Closed Able to stand 10 seconds safely   Standing Ubsupported with Feet Together Able to place feet together independently and stand 1 minute safely   From Standing, Reach Forward with Outstretched Arm Can reach forward >12 cm safely (5")   From Standing Position, Pick up Object from Floor Able to pick up shoe safely and easily   From Standing Position, Turn to Look Behind Over each Shoulder Turn sideways only but maintains balance   Turn 360 Degrees Able to turn 360 degrees safely in 4 seconds or less   Standing Unsupported, Alternately Place Feet on Step/Stool Able to complete 4 steps without aid or supervision   Standing Unsupported, One Foot in Front Able to plae foot ahead of the other independently and hold 30 seconds   Standing on One Leg Able to lift leg independently and hold 5-10 seconds   Total Score 49   Berg comment: Patient has improved from 36/56 up to 49/56.  He is noting improved balance with activities in therapy.     Standing balance activities on rocker board in parallel bars  Gaze activities in standing x 1 viewing with target 3 feet from patient.   Gait: Ambulation with head motion in horizontal plane every 3 steps with CGA for safety, scanning environment while walking.  Forward/backwards walking and sidestepping.      [facility] Adult PT Treatment/Exercise - 09/01/15 1001      Ambulation/Gait   Ambulation/Gait Yes   Ambulation/Gait Assistance 6: Modified independent (Device/Increase time)   Ambulation Distance (Feet) 400 Feet   Assistive device None   Ambulation Surface Level   Gait Comments Gait with changes in direction, turns,  marching with SBA.             PT Short Term Goals - 09/01/15 1000      PT SHORT TERM GOAL #1   Title Patient is independent in initial HEP. (Target Date: 09/17/2015)   Baseline Met on 09/01/2015 with patient working on balance at home.   Time 4   Period Weeks   Status Achieved     PT SHORT TERM GOAL #2   Title Berg Balance >/= 40/56 to indicate lower fall risk. (Target Date: 09/17/2015)   Baseline Met on 09/01/2015 improving to 49/56.    Time 4   Period Weeks   Status Achieved     PT SHORT TERM GOAL #3   Title Patient is able to ambulate while scanning environment  without balance losses.(Target Date: 09/17/2015)   Baseline Met on 09/01/2015 with patient demonstrating horizontal turns every 3 steps without loss of balance.   Time 4   Period Weeks   Status Achieved           PT Long Term Goals - 09/01/15 1011      PT LONG TERM GOAL #1   Title Patient is independent in ongoing HEP / fitness plan. (Target Date: 10/15/2015)   Time 8   Period Weeks   Status On-going     PT LONG TERM GOAL #2   Title Berg Balance >45/56 to indicate lower fall risk. (Target Date: 10/15/2015)   Baseline Met on 09/01/2015 improving from 36/56 up to 49/56.    Time 4   Period Weeks   Status Achieved     PT LONG TERM GOAL #3   Title Dynamic Gait Index >17/24 to indicate lower fall risk. (Target Date: 10/15/2015)   Time 8   Period Weeks   Status On-going     PT LONG TERM GOAL #4   Title Activities of Balance Confidence improves >10% to indicate improved confidence. (Target Date: 10/15/2015)   Time 8   Period Weeks   Status On-going     PT LONG TERM GOAL #5   Title Patient reports no falls in last 2 weeks. (Target Date: 10/15/2015)   Time 8   Period Weeks   Status On-going               Plan - 09/01/15 1016    Clinical Impression Statement The patient met 3/3 STGs and 1 LTG.  He is demonstrating improved dynamic gait activities in therapy and improving balance.  PT to continue working  towards [place].   Rehab Potential Good   PT Frequency 2x / week   PT Duration 8 weeks   PT Treatment/Interventions ADLs/Self Care Home Management;DME Instruction;Gait training;Stair training;Functional mobility training;Therapeutic activities;Therapeutic exercise;Balance training;Neuromuscular re-education;Patient/family education   PT Next Visit Plan Up from low surfaces, check HEP, dynamic gait, gaze x 1 viewing.   Consulted and Agree with Plan of Care Patient      Patient will benefit from skilled therapeutic intervention in order to improve the following deficits and impairments:  Abnormal gait, Decreased activity tolerance, Decreased balance, Decreased mobility, Decreased strength, Dizziness, Postural dysfunction  Visit Diagnosis: Dizziness and giddiness  Unsteadiness on feet  Other abnormalities of gait and mobility  Muscle weakness (generalized)     Problem List Patient Active Problem List   Diagnosis Date Noted  . GI bleed 03/19/2013  . Supratherapeutic INR 03/17/2013  . Melena 03/17/2013  . CHB (complete heart block) ([place]) 10/13/2012  . Cough 10/03/2012  . Dyslipidemia 07/11/2012  . Preop cardiovascular exam 07/11/2012  . Ischemic cardiomyopathy 07/11/2012  . Pacemaker - Medtronic Adapta dual chamber 03/08/2012  . Anticoagulant long-term use, on coumadin for PAF 03/08/2012  . Non-sustained ventricular tachycardia ([place]) 12/21/2010  . Hypertension 12/21/2010  . Sinus arrest   . DYSPNEA ON EXERTION 12/23/2009  . 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 DIABETES MELLITUS 12/12/2007  . Obstructive sleep apnea 12/12/2007  . Coronary atherosclerosis 12/12/2007  . ATRIAL FIBRILLATION, PAROXYSMAL 12/12/2007  . CORONARY ARTERY BYPASS GRAFT, HX OF, in 1990 and Re-do CABG 2004 with Atrial Maze procedure,  12/12/2007    [facility], PT 09/02/2015, 2:24 PM  [facility] [address] [place], [place],  [address] Phone: [phone]   Fax:  [phone]  Name: JESIAH GRISMER MRN:  [id number] Date of Birth: 24-Nov-1940

## 2015-09-03 ENCOUNTER — Encounter: Payer: PPO | Admitting: Rehabilitative and Restorative Service Providers"

## 2015-09-08 ENCOUNTER — Ambulatory Visit: Payer: PPO | Attending: Family Medicine | Admitting: Rehabilitative and Restorative Service Providers"

## 2015-09-08 DIAGNOSIS — M6281 Muscle weakness (generalized): Secondary | ICD-10-CM | POA: Insufficient documentation

## 2015-09-08 DIAGNOSIS — R42 Dizziness and giddiness: Secondary | ICD-10-CM | POA: Diagnosis not present

## 2015-09-08 DIAGNOSIS — R2681 Unsteadiness on feet: Secondary | ICD-10-CM | POA: Insufficient documentation

## 2015-09-08 DIAGNOSIS — R2689 Other abnormalities of gait and mobility: Secondary | ICD-10-CM | POA: Insufficient documentation

## 2015-09-08 NOTE — Patient Instructions (Signed)
Scapular Retraction: Bilateral    Facing anchor, pull arms back, bringing shoulder blades together. Repeat __10__ times per set. Do __2__ sets per session. Do _1___ sessions per day.  http://orth.exer.us/176   Copyright  VHI. All rights reserved.   EXTENSION: Standing - Resistance Band: Stable (Active)    Stand, right arm at side. Against blue resistance band, draw arm backward, as far as possible, keeping elbow straight. Complete __2_ sets of _10__ repetitions. Perform _1__ sessions per day.  Copyright  VHI. All rights reserved.  External Rotation (Resistive Band)    Elbow bent at right angle, and held firmly against side. Using other arm as anchor, pull involved arm outward. Hold __5__ seconds. Repeat __10__ times. Do __2__ sets.  Copyright  VHI. All rights reserved.

## 2015-09-08 NOTE — Therapy (Signed)
[facility] [address] [place], [place], [address] Phone: [phone]   Fax:  [phone]  Physical Therapy Treatment  Patient Details  Name: ROMARI GASPARRO MRN: [id number] Date of Birth: 05/06/1940 Referring Provider: Anastasia Pall, MD  Encounter Date: 09/08/2015      PT End of Session - 09/08/15 0956    Visit Number 4   Number of Visits 17   Date for PT Re-Evaluation 10/17/05   Authorization Type Medicare G-code   PT Start Time 0935   PT Stop Time 1015   PT Time Calculation (min) 40 min   Equipment Utilized During Treatment Gait belt   Activity Tolerance Patient tolerated treatment well   Behavior During Therapy [facility] for tasks assessed/performed      Past Medical History:  Diagnosis Date  . A-fib ([place])   . Anticoagulant long-term use, on coumadin for PAF 03/08/2012  . Chronic back pain   . Complete heart block ([place])   . Coronary artery disease    seen at [facility] cardiology   . Diabetes mellitus   . Hx of echocardiogram 06/29/2008   EF 45-50%   . Hypertension    Dr. Veronia Beets, primary  . Neuromuscular disorder ([place])    carpal tunnel bilaterally  . 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 Polio    hx of  . Sleep apnea    cpap, 11, last sleep study Jan2013, sees Dr. Keturah Barre  . Status post placement of cardiac pacemaker, 03/07/12 New Medtronic Gen and new Vent lead original placed 2008 03/08/2012    Past Surgical History:  Procedure Laterality Date  . APPENDECTOMY    . BACK SURGERY    . CARDIAC CATHETERIZATION    . CORONARY ARTERY BYPASS GRAFT  1990  . CORONARY ARTERY BYPASS GRAFT  2004  . ESOPHAGOGASTRODUODENOSCOPY Left 03/18/2013   Procedure: ESOPHAGOGASTRODUODENOSCOPY (EGD);  Surgeon: Cleotis Nipper, MD;  Location: [facility] ENDOSCOPY;  Service: Endoscopy;  Laterality: Left;  . FRACTURE SURGERY     Hx: of right leg  . HERNIA REPAIR    . INSERT / REPLACE / REMOVE PACEMAKER  03/07/2012   replaced  . JOINT REPLACEMENT     Hx: of left knee  . LEFT HEART CATHETERIZATION WITH CORONARY/GRAFT ANGIOGRAM N/A 12/21/2010   Procedure: LEFT HEART CATHETERIZATION WITH Beatrix Fetters;  Surgeon: Leonie Man, MD;  Location[facility] C[facility] LAB;  Service: Cardiovascular;  Laterality: N/A;  . LUMBAR LAMINECTOMY/DECOMPRESSION MICRODISCECTOMY  01/10/2012   Procedure: LUMBAR LAMINECTOMY/DECOMPRESSION MICRODISCECTOMY 1 LEVEL;  Surgeon: [name], MD;  Location: [facility] NEURO ORS;  Service: Neurosurgery;  Laterality: Bilateral;  Lumbar three-four decompression, Posterior lateral fusion lumbar three-four, posterior spinus plate lumbar three-four  . MAZE Procedure  2004  . NASAL SEPTUM SURGERY N/A 14  . PACEMAKER INSERTION  08, 1/14  . PACEMAKER REVISION N/A 03/07/2012   Procedure: PACEMAKER REVISION;  Surgeon: Sanda Klein, MD;  Location: [facility] CATH LAB;  Service: Cardiovascular;  Laterality: N/A;  . POSTERIOR FUSION LUMBAR SPINE  08/28/2012   Dr Ronnald Ramp  . SHOULDER ARTHROSCOPY WITH OPEN ROTATOR CUFF REPAIR Right 03/30/2014   Procedure: RIGHT SHOULDER ARTHROSCOPY  OPEN ROTATOR CUFF REPAIR;  Surgeon: Kerin Salen, MD;  Location: [facility];  Service: Orthopedics;  Laterality: Right;  . SHOULDER OPEN ROTATOR CUFF REPAIR Right 03/30/2014   Procedure: ROTATOR CUFF REPAIR SHOULDER OPEN;  Surgeon: Kerin Salen, MD;  Location: [facility];  Service: Orthopedics;  Laterality: Right;  . STENTS    .  TONSILLECTOMY      There were no vitals filed for this visit.      Subjective Assessment - 09/08/15 0937    Subjective The patient reports he has variable days--some that are good, some that are bad.  He is not able to determine a pattern.  HEP are going okay.   Patient Stated Goals Not be dizzy & be able to balance.    Currently in Pain? No/denies            [facility] PT Assessment - 09/08/15 1012      Ambulation/Gait   Ambulation/Gait Yes   Ambulation/Gait Assistance 7: Independent   Ambulation Distance (Feet)  400 Feet   Assistive device None   Ambulation Surface Level   Stairs Yes   Stairs Assistance 6: Modified independent (Device/Increase time)   Stair Management Technique One rail Right;Alternating pattern;Step to pattern   Number of Stairs 4   Gait Comments Stair mgmt with education on technique of stronger leg up first and weaker leg descending first.  He is able to take steps to ascend with step to pattern without handrail, but does need UE support for descending steps, and for reciprocal pattern.  Patient's ankle ROM/from fusion hinders mechanics to descend steps.      Dynamic Gait Index   Level Surface Mild Impairment   Change in Gait Speed Normal   Gait with Horizontal Head Turns Normal   Gait with Vertical Head Turns Normal   Gait and Pivot Turn Normal   Step Over Obstacle Mild Impairment   Step Around Obstacles Normal   Steps Mild Impairment   Total Score 21                     [facility] Adult PT Treatment/Exercise - 09/08/15 1012      Therapeutic Activites    Therapeutic Activities Other Therapeutic Activities   Other Therapeutic Activities up/down from low surfaces with UE support, without UE support (to mimic getting up/down in garage from stool).     Neuro Re-ed    Neuro Re-ed Details  Standing on compliant surfaces performing marching, 1/2 tandem stand, standing with head motion, and alternating foot taps while on compliant surface.  Standing/gait activities with head motion spotting visual targets (playing cards).  Standing gaze x 1 viewing with cues on increasing speed of motion.     Exercises   Exercises Other Exercises   Other Exercises  Standing shoulder stabiization and posture re-ed.  See HEP instructions for specific exercises.      Manual Therapy   Manual Therapy Muscle Energy Technique   Muscle Energy Technique resisted cervical rotation, extension, sidebending for neck discomfort x 3 reps x 8 second holds.                 PT Education -  09/08/15 1429    Education provided Yes   Education Details HEP: postural strengthening: door/theraband rows, shoulder extension, and shoulder ER   Person(s) Educated Patient   Methods Explanation;Demonstration;Handout   Comprehension Verbalized understanding;Returned demonstration          PT Short Term Goals - 09/01/15 1000      PT SHORT TERM GOAL #1   Title Patient is independent in initial HEP. (Target Date: 09/17/2015)   Baseline Met on 09/01/2015 with patient working on balance at home.   Time 4   Period Weeks   Status Achieved     PT SHORT TERM GOAL #2   Title Oceanographer >/=  40/56 to indicate lower fall risk. (Target Date: 09/17/2015)   Baseline Met on 09/01/2015 improving to 49/56.    Time 4   Period Weeks   Status Achieved     PT SHORT TERM GOAL #3   Title Patient is able to ambulate while scanning environment without balance losses.(Target Date: 09/17/2015)   Baseline Met on 09/01/2015 with patient demonstrating horizontal turns every 3 steps without loss of balance.   Time 4   Period Weeks   Status Achieved           PT Long Term Goals - 09/08/15 1434      PT LONG TERM GOAL #1   Title Patient is independent in ongoing HEP / fitness plan. (Target Date: 10/15/2015)   Time 8   Period Weeks   Status On-going     PT LONG TERM GOAL #2   Title Berg Balance >45/56 to indicate lower fall risk. (Target Date: 10/15/2015)   Baseline Met on 09/01/2015 improving from 36/56 up to 49/56.    Time 4   Period Weeks   Status Achieved     PT LONG TERM GOAL #3   Title Dynamic Gait Index >17/24 to indicate lower fall risk. (Target Date: 10/15/2015)   Baseline Improved to 21/24 from 11/24.   Time 8   Period Weeks   Status Achieved     PT LONG TERM GOAL #4   Title Activities of Balance Confidence improves >10% to indicate improved confidence. (Target Date: 10/15/2015)   Time 8   Period Weeks   Status On-going     PT LONG TERM GOAL #5   Title Patient reports no falls in last 2  weeks. (Target Date: 10/15/2015)   Time 8   Period Weeks   Status On-going               Plan - 09/08/15 1435    Clinical Impression Statement The patient met LTG for DGI today improving 10 points since initial evaluation.  The patient and PT discussed fall risk as being related to multiple fall factors including h/o R LE weakness, R ankle fusion, h/o back issues, h/o vertigo.     PT Treatment/Interventions ADLs/Self Care Home Management;DME Instruction;Gait training;Stair training;Functional mobility training;Therapeutic activities;Therapeutic exercise;Balance training;Neuromuscular re-education;Patient/family education   PT Next Visit Plan Floor<>stand, check HEP, vor progression, dynamic gait   Consulted and Agree with Plan of Care Patient      Patient will benefit from skilled therapeutic intervention in order to improve the following deficits and impairments:  Abnormal gait, Decreased activity tolerance, Decreased balance, Decreased mobility, Decreased strength, Dizziness, Postural dysfunction  Visit Diagnosis: Dizziness and giddiness  Unsteadiness on feet  Other abnormalities of gait and mobility  Muscle weakness (generalized)     Problem List Patient Active Problem List   Diagnosis Date Noted  . GI bleed 03/19/2013  . Supratherapeutic INR 03/17/2013  . Melena 03/17/2013  . CHB (complete heart block) ([place]) 10/13/2012  . Cough 10/03/2012  . Dyslipidemia 07/11/2012  . Preop cardiovascular exam 07/11/2012  . Ischemic cardiomyopathy 07/11/2012  . Pacemaker - Medtronic Adapta dual chamber 03/08/2012  . Anticoagulant long-term use, on coumadin for PAF 03/08/2012  . Non-sustained ventricular tachycardia ([place]) 12/21/2010  . Hypertension 12/21/2010  . Sinus arrest   . DYSPNEA ON EXERTION 12/23/2009  . 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 DIABETES MELLITUS 12/12/2007  . Obstructive sleep apnea 12/12/2007  . Coronary atherosclerosis 12/12/2007  . ATRIAL FIBRILLATION,  PAROXYSMAL 12/12/2007  . CORONARY ARTERY BYPASS  GRAFT, HX OF, in 1990 and Re-do CABG 2004 with Atrial Maze procedure,  12/12/2007    Moreen Piggott , PT 09/08/2015, 2:37 PM  [facility] [address] [place], [place], [address] Phone: [phone]   Fax:  [phone]  Name: KAYLER RISE MRN: [id number] Date of Birth: 24-May-1940

## 2015-09-10 ENCOUNTER — Ambulatory Visit: Payer: PPO | Admitting: Rehabilitative and Restorative Service Providers"

## 2015-09-10 DIAGNOSIS — M6281 Muscle weakness (generalized): Secondary | ICD-10-CM

## 2015-09-10 DIAGNOSIS — R2689 Other abnormalities of gait and mobility: Secondary | ICD-10-CM

## 2015-09-10 DIAGNOSIS — R42 Dizziness and giddiness: Secondary | ICD-10-CM

## 2015-09-10 DIAGNOSIS — R2681 Unsteadiness on feet: Secondary | ICD-10-CM

## 2015-09-10 NOTE — Therapy (Signed)
[facility] [address] [place], [place], [address] Phone: [phone]   Fax:  [phone]  Physical Therapy Treatment  Patient Details  Name: Alan [name] MRN: [id number] Date of Birth: [birth year] Referring Provider: Anastasia Pall, MD  Encounter Date: 09/10/2015      PT End of Session - 09/10/15 0807    Visit Number 5   Number of Visits 17   Date for PT Re-Evaluation 10/17/05   Authorization Type Medicare G-code   PT Start Time 0804   PT Stop Time 0845   PT Time Calculation (min) 41 min   Equipment Utilized During Treatment Gait belt   Activity Tolerance Patient tolerated treatment well   Behavior During Therapy [facility] for tasks assessed/performed      Past Medical History:  Diagnosis Date  . A-fib ([place])   . Anticoagulant long-term use, on coumadin for PAF 03/08/2012  . Chronic back pain   . Complete heart block ([place])   . Coronary artery disease    seen at [facility] cardiology   . Diabetes mellitus   . Hx of echocardiogram 06/29/2008   EF 45-50%   . Hypertension    Dr. Veronia Beets, primary  . Neuromuscular disorder ([place])    carpal tunnel bilaterally  . 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 Polio    hx of  . Sleep apnea    cpap, 11, last sleep study Jan2013, sees Dr. Keturah Barre  . Status post placement of cardiac pacemaker, 03/07/12 New Medtronic Gen and new Vent lead original placed 2008 03/08/2012    Past Surgical History:  Procedure Laterality Date  . APPENDECTOMY    . BACK SURGERY    . CARDIAC CATHETERIZATION    . CORONARY ARTERY BYPASS GRAFT  1990  . CORONARY ARTERY BYPASS GRAFT  2004  . ESOPHAGOGASTRODUODENOSCOPY Left 03/18/2013   Procedure: ESOPHAGOGASTRODUODENOSCOPY (EGD);  Surgeon: Cleotis Nipper, MD;  Location: [facility] ENDOSCOPY;  Service: Endoscopy;  Laterality: Left;  . FRACTURE SURGERY     Hx: of right leg  . HERNIA REPAIR    . INSERT / REPLACE / REMOVE PACEMAKER  03/07/2012    replaced  . JOINT REPLACEMENT     Hx: of left knee  . LEFT HEART CATHETERIZATION WITH CORONARY/GRAFT ANGIOGRAM N/A 12/21/2010   Procedure: LEFT HEART CATHETERIZATION WITH Beatrix Fetters;  Surgeon: Leonie Man, MD;  Location: [facility] CATH LAB;  Service: Cardiovascular;  Laterality: N/A;  . LUMBAR LAMINECTOMY/DECOMPRESSION MICRODISCECTOMY  01/10/2012   Procedure: LUMBAR LAMINECTOMY/DECOMPRESSION MICRODISCECTOMY 1 LEVEL;  Surgeon: [name], MD;  Location: [facility] NEURO ORS;  Service: Neurosurgery;  Laterality: Bilateral;  Lumbar three-four decompression, Posterior lateral fusion lumbar three-four, posterior spinus plate lumbar three-four  . MAZE Procedure  2004  . NASAL SEPTUM SURGERY N/A 14  . PACEMAKER INSERTION  08, 1/14  . PACEMAKER REVISION N/A 03/07/2012   Procedure: PACEMAKER REVISION;  Surgeon: Sanda Klein, MD;  Location: [facility] CATH LAB;  Service: Cardiovascular;  Laterality: N/A;  . POSTERIOR FUSION LUMBAR SPINE  08/28/2012   Dr Ronnald Ramp  . SHOULDER ARTHROSCOPY WITH OPEN ROTATOR CUFF REPAIR Right 03/30/2014   Procedure: RIGHT SHOULDER ARTHROSCOPY  OPEN ROTATOR CUFF REPAIR;  Surgeon: Kerin Salen, MD;  Location: [facility];  Service: Orthopedics;  Laterality: Right;  . SHOULDER OPEN ROTATOR CUFF REPAIR Right 03/30/2014   Procedure: ROTATOR CUFF REPAIR SHOULDER OPEN;  Surgeon: Kerin Salen, MD;  Location: [facility];  Service: Orthopedics;  Laterality: Right;  . STENTS    .  TONSILLECTOMY      There were no vitals filed for this visit.      Subjective Assessment - 09/10/15 0805    Subjective The patient has been sick since last session- upset stomach.  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     Patient Stated Goals Not be dizzy & be able to balance.    Currently in Pain? No/denies            [facility] Adult PT Treatment/Exercise - 09/10/15 0806      Ambulation/Gait   Ambulation/Gait Yes   Ambulation/Gait Assistance 7: Independent on level surfaces; performed compliant surface  ambulation outdoors on level/unlevel surfaces with supervision without loss of balance noted.  Negotiated grass, rubber mulch, curbs, and pinestraw.     Neuro Re-ed    Neuro Re-ed Details  L LE tapping to 6" step with weight shift to the right with CGA without UE support.  Rocker board standing with horizontal head turns, tactile cues for hip strategy, and eyes closed with CGA to min A.    Wall bumps for hip strategy and the nreturn to rocker board for hip strategy after stretching.      Exercises   Exercises Other Exercises   Other Exercises  Supine towel roll stretch for anterior pelvic tilt, knee to chest R and L sides.  Bridges with lumbar roll to encourage anterior pelvic tilt.  P/ROM hamstring stretch.  Sit<>stand x 10 reps emphasizing hip extension upon rising.                    PT Short Term Goals - 09/01/15 1000      PT SHORT TERM GOAL #1   Title Patient is independent in initial HEP. (Target Date: 09/17/2015)   Baseline Met on 09/01/2015 with patient working on balance at home.   Time 4   Period Weeks   Status Achieved     PT SHORT TERM GOAL #2   Title Berg Balance >/= 40/56 to indicate lower fall risk. (Target Date: 09/17/2015)   Baseline Met on 09/01/2015 improving to 49/56.    Time 4   Period Weeks   Status Achieved     PT SHORT TERM GOAL #3   Title Patient is able to ambulate while scanning environment without balance losses.(Target Date: 09/17/2015)   Baseline Met on 09/01/2015 with patient demonstrating horizontal turns every 3 steps without loss of balance.   Time 4   Period Weeks   Status Achieved           PT Long Term Goals - 09/08/15 1434      PT LONG TERM GOAL #1   Title Patient is independent in ongoing HEP / fitness plan. (Target Date: 10/15/2015)   Time 8   Period Weeks   Status On-going     PT LONG TERM GOAL #2   Title Berg Balance >45/56 to indicate lower fall risk. (Target Date: 10/15/2015)   Baseline Met on 09/01/2015 improving from 36/56  up to 49/56.    Time 4   Period Weeks   Status Achieved     PT LONG TERM GOAL #3   Title Dynamic Gait Index >17/24 to indicate lower fall risk. (Target Date: 10/15/2015)   Baseline Improved to 21/24 from 11/24.   Time 8   Period Weeks   Status Achieved     PT LONG TERM GOAL #4   Title Activities of Balance Confidence improves >10% to indicate improved confidence. (  Target Date: 10/15/2015)   Time 8   Period Weeks   Status On-going     PT LONG TERM GOAL #5   Title Patient reports no falls in last 2 weeks. (Target Date: 10/15/2015)   Time 8   Period Weeks   Status On-going               Plan - 09/10/15 0844    Clinical Impression Statement The patient is demonstrating progress with using hip strategy for balance.  He reports his h/o falls occurs mostly when on unlevel/compliant/outdoor surfaces.  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    PT Treatment/Interventions ADLs/Self Care Home Management;DME Instruction;Gait training;Stair training;Functional mobility training;Therapeutic activities;Therapeutic exercise;Balance training;Neuromuscular re-education;Patient/family education   PT Next Visit Plan Check HEP, check LTGs/ begin d/c planning.   Consulted and Agree with Plan of Care Patient      Patient will benefit from skilled therapeutic intervention in order to improve the following deficits and impairments:  Abnormal gait, Decreased activity tolerance, Decreased balance, Decreased mobility, Decreased strength, Dizziness, Postural dysfunction  Visit Diagnosis: Dizziness and giddiness  Unsteadiness on feet  Other abnormalities of gait and mobility  Muscle weakness (generalized)     Problem List Patient Active Problem List   Diagnosis Date Noted  . GI bleed 03/19/2013  . Supratherapeutic INR 03/17/2013  . Melena 03/17/2013  . CHB (complete heart block) ([place]) 10/13/2012  . Cough 10/03/2012   . Dyslipidemia 07/11/2012  . Preop cardiovascular exam 07/11/2012  . Ischemic cardiomyopathy 07/11/2012  . Pacemaker - Medtronic Adapta dual chamber 03/08/2012  . Anticoagulant long-term use, on coumadin for PAF 03/08/2012  . Non-sustained ventricular tachycardia ([place]) 12/21/2010  . Hypertension 12/21/2010  . Sinus arrest   . DYSPNEA ON EXERTION 12/23/2009  . 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 DIABETES MELLITUS 12/12/2007  . Obstructive sleep apnea 12/12/2007  . Coronary atherosclerosis 12/12/2007  . ATRIAL FIBRILLATION, PAROXYSMAL 12/12/2007  . CORONARY ARTERY BYPASS GRAFT, HX OF, in 1990 and Re-do CABG 2004 with Atrial Maze procedure,  12/12/2007    Denali Becvar, PT 09/10/2015, 12:44 PM  [facility] [address] [place], [place], [address] Phone: [phone]   Fax:  [phone]  Name: Alan [name] MRN: [id number] Date of Birth: [birth year]

## 2015-09-14 ENCOUNTER — Ambulatory Visit: Payer: PPO | Admitting: Rehabilitative and Restorative Service Providers"

## 2015-09-14 DIAGNOSIS — G4733 Obstructive sleep apnea (adult) (pediatric): Secondary | ICD-10-CM | POA: Diagnosis not present

## 2015-09-16 ENCOUNTER — Ambulatory Visit: Payer: PPO | Admitting: Rehabilitative and Restorative Service Providers"

## 2015-09-16 DIAGNOSIS — R2681 Unsteadiness on feet: Secondary | ICD-10-CM

## 2015-09-16 DIAGNOSIS — R42 Dizziness and giddiness: Secondary | ICD-10-CM

## 2015-09-16 DIAGNOSIS — M6281 Muscle weakness (generalized): Secondary | ICD-10-CM

## 2015-09-16 DIAGNOSIS — R2689 Other abnormalities of gait and mobility: Secondary | ICD-10-CM

## 2015-09-16 NOTE — Therapy (Signed)
[facility] [address] [place], [place], [address] Phone: [phone]   Fax:  [phone]  Physical Therapy Treatment  Patient Details  Name: Alan [name] MRN: [id number] Date of Birth: [birth year] Referring Provider: Anastasia Pall, MD  Encounter Date: 09/16/2015      PT End of Session - 09/16/15 0936    Visit Number 6   Number of Visits 17   Date for PT Re-Evaluation 10/17/05   Authorization Type Medicare G-code   PT Start Time 0932   PT Stop Time 1015   PT Time Calculation (min) 43 min   Equipment Utilized During Treatment Gait belt   Activity Tolerance Patient tolerated treatment well   Behavior During Therapy [facility] for tasks assessed/performed      Past Medical History:  Diagnosis Date  . A-fib ([place])   . Anticoagulant long-term use, on coumadin for PAF 03/08/2012  . Chronic back pain   . Complete heart block ([place])   . Coronary artery disease    seen at [facility] cardiology   . Diabetes mellitus   . Hx of echocardiogram 06/29/2008   EF 45-50%   . Hypertension    Dr. Veronia Beets, primary  . Neuromuscular disorder ([place])    carpal tunnel bilaterally  . 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 Polio    hx of  . Sleep apnea    cpap, 11, last sleep study Jan2013, sees Dr. Keturah Barre  . Status post placement of cardiac pacemaker, 03/07/12 New Medtronic Gen and new Vent lead original placed 2008 03/08/2012    Past Surgical History:  Procedure Laterality Date  . APPENDECTOMY    . BACK SURGERY    . CARDIAC CATHETERIZATION    . CORONARY ARTERY BYPASS GRAFT  1990  . CORONARY ARTERY BYPASS GRAFT  2004  . ESOPHAGOGASTRODUODENOSCOPY Left 03/18/2013   Procedure: ESOPHAGOGASTRODUODENOSCOPY (EGD);  Surgeon: Cleotis Nipper, MD;  Location: [facility] ENDOSCOPY;  Service: Endoscopy;  Laterality: Left;  . FRACTURE SURGERY     Hx: of right leg  . HERNIA REPAIR    . INSERT / REPLACE / REMOVE PACEMAKER  03/07/2012    replaced  . JOINT REPLACEMENT     Hx: of left knee  . LEFT HEART CATHETERIZATION WITH CORONARY/GRAFT ANGIOGRAM N/A 12/21/2010   Procedure: LEFT HEART CATHETERIZATION WITH Beatrix Fetters;  Surgeon: Leonie Man, MD;  Location: [facility] CATH LAB;  Service: Cardiovascular;  Laterality: N/A;  . LUMBAR LAMINECTOMY/DECOMPRESSION MICRODISCECTOMY  01/10/2012   Procedure: LUMBAR LAMINECTOMY/DECOMPRESSION MICRODISCECTOMY 1 LEVEL;  Surgeon: [name], MD;  Location: [place] NEURO ORS;  Service: Neurosurgery;  Laterality: Bilateral;  Lumbar three-four decompression, Posterior lateral fusion lumbar three-four, posterior spinus plate lumbar three-four  . MAZE Procedure  2004  . NASAL SEPTUM SURGERY N/A 14  . PACEMAKER INSERTION  08, 1/14  . PACEMAKER REVISION N/A 03/07/2012   Procedure: PACEMAKER REVISION;  Surgeon: Sanda Klein, MD;  Location: [facility] CATH LAB;  Service: Cardiovascular;  Laterality: N/A;  . POSTERIOR FUSION LUMBAR SPINE  08/28/2012   Dr Ronnald Ramp  . SHOULDER ARTHROSCOPY WITH OPEN ROTATOR CUFF REPAIR Right 03/30/2014   Procedure: RIGHT SHOULDER ARTHROSCOPY  OPEN ROTATOR CUFF REPAIR;  Surgeon: Kerin Salen, MD;  Location: [place];  Service: Orthopedics;  Laterality: Right;  . SHOULDER OPEN ROTATOR CUFF REPAIR Right 03/30/2014   Procedure: ROTATOR CUFF REPAIR SHOULDER OPEN;  Surgeon: Kerin Salen, MD;  Location: [place];  Service: Orthopedics;  Laterality: Right;  . STENTS    .  TONSILLECTOMY      There were no vitals filed for this visit.      Subjective Assessment - 09/16/15 0934    Subjective The patient reports that exercises for posture are aggravating shoulder pain.  PT recommended he hold those activities.    Patient Stated Goals Not be dizzy & be able to balance.    Currently in Pain? No/denies          [facility] Adult PT Treatment/Exercise - 09/16/15 1030      Self-Care   Self-Care Other Self-Care Comments   Other Self-Care Comments  Discussed potential for early d/c from PT.  Patient expresses interest in continuing PT.  We discussed long term plan is to continue activities in a community setting.  Patient has silver sneakers, but does not participate.  He expressed potential interest in [facility] program.  PT encouraged him to check into gym.  We also discussed prior cardio exercise.  He has stopped performing bike and treadmill (home equipment).  We discussed resuming activities on home equipment and slowly ramping up to 30 minutes 4x/week.  PT and patient discussed chronic deficits related to balance and how to manage those issues long term. Also reviewed need for walking stick when out on tractor/getting off tractor to navigate unlevel surfaces.       Neuro Re-ed    Neuro Re-ed Details  Reviewed prior home exercise program of corner standing/compliant surfaces with eyes closed +feet together, tried partial heel/toe with eyes closed and patient able to perform without difficulty.  Compliant surfaces with head motion, gaze x 1 viewing increasing to 60 seconds, sit<>stand, backwards walking, and single leg stance activities.  Also performed compliant standing activities with R<>L trunk rotation while passing a ball over shoulder, rocker board with reactive balance strategies, rocker board with eyes closed and CGA for support.                  PT Education - 09/16/15 1009    Education provided Yes   Education Details HEP added cardio equipment and discussed regular recumbent bike/treadmill use.   Person(s) Educated Patient   Methods Explanation;Demonstration;Handout   Comprehension Verbalized understanding;Returned demonstration          PT Short Term Goals - 09/01/15 1000      PT SHORT TERM GOAL #1   Title Patient is independent in initial HEP. (Target Date: 09/17/2015)   Baseline Met on 09/01/2015 with patient working on balance at home.   Time 4   Period Weeks   Status Achieved     PT SHORT TERM GOAL #2   Title Berg Balance >/= 40/56 to indicate  lower fall risk. (Target Date: 09/17/2015)   Baseline Met on 09/01/2015 improving to 49/56.    Time 4   Period Weeks   Status Achieved     PT SHORT TERM GOAL #3   Title Patient is able to ambulate while scanning environment without balance losses.(Target Date: 09/17/2015)   Baseline Met on 09/01/2015 with patient demonstrating horizontal turns every 3 steps without loss of balance.   Time 4   Period Weeks   Status Achieved           PT Long Term Goals - 09/08/15 1434      PT LONG TERM GOAL #1   Title Patient is independent in ongoing HEP / fitness plan. (Target Date: 10/15/2015)   Time 8   Period Weeks   Status On-going     PT LONG TERM  GOAL #2   Title Berg Balance >45/56 to indicate lower fall risk. (Target Date: 10/15/2015)   Baseline Met on 09/01/2015 improving from 36/56 up to 49/56.    Time 4   Period Weeks   Status Achieved     PT LONG TERM GOAL #3   Title Dynamic Gait Index >17/24 to indicate lower fall risk. (Target Date: 10/15/2015)   Baseline Improved to 21/24 from 11/24.   Time 8   Period Weeks   Status Achieved     PT LONG TERM GOAL #4   Title Activities of Balance Confidence improves >10% to indicate improved confidence. (Target Date: 10/15/2015)   Time 8   Period Weeks   Status On-going     PT LONG TERM GOAL #5   Title Patient reports no falls in last 2 weeks. (Target Date: 10/15/2015)   Time 8   Period Weeks   Status On-going               Plan - 09/16/15 1027    Clinical Impression Statement The patient is continuing to demonstrate progress with balance and mobility.  PT recommended he check into Silver [employer]neakers program and begin return to cardio equipment/exercise he has in his home for strength/conditioning for post d/c plan.     PT Treatment/Interventions ADLs/Self Care Home Management;DME Instruction;Gait training;Stair training;Functional mobility training;Therapeutic activities;Therapeutic exercise;Balance training;Neuromuscular  re-education;Patient/family education   PT Next Visit Plan Progress multi-sensory balance training, dynamic gait training, encourage transition to community/he was going to check into silver sneakers.  Continue preparing for d/c- anticipate earlier d/c than goal date.   Consulted and Agree with Plan of Care Patient      Patient will benefit from skilled therapeutic intervention in order to improve the following deficits and impairments:  Abnormal gait, Decreased activity tolerance, Decreased balance, Decreased mobility, Decreased strength, Dizziness, Postural dysfunction  Visit Diagnosis: Dizziness and giddiness  Unsteadiness on feet  Other abnormalities of gait and mobility  Muscle weakness (generalized)     Problem List Patient Active Problem List   Diagnosis Date Noted  . GI bleed 03/19/2013  . Supratherapeutic INR 03/17/2013  . Melena 03/17/2013  . CHB (complete heart block) ([place]) 10/13/2012  . Cough 10/03/2012  . Dyslipidemia 07/11/2012  . Preop cardiovascular exam 07/11/2012  . Ischemic cardiomyopathy 07/11/2012  . Pacemaker - Medtronic Adapta dual chamber 03/08/2012  . Anticoagulant long-term use, on coumadin for PAF 03/08/2012  . Non-sustained ventricular tachycardia ([place]) 12/21/2010  . Hypertension 12/21/2010  . Sinus arrest   . DYSPNEA ON EXERTION 12/23/2009  . 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 DIABETES MELLITUS 12/12/2007  . Obstructive sleep apnea 12/12/2007  . Coronary atherosclerosis 12/12/2007  . ATRIAL FIBRILLATION, PAROXYSMAL 12/12/2007  . CORONARY ARTERY BYPASS GRAFT, HX OF, in 1990 and Re-do CABG 2004 with Atrial Maze procedure,  12/12/2007    Nekita Pita, PT 09/16/2015, 10:34 AM  [facility] [address] [place], [place], [address] Phone: [phone]   Fax:  [phone]  Name: Alan [name] MRN: [id number] Date of Birth: 1940/05/15

## 2015-09-16 NOTE — Patient Instructions (Signed)
4 days/week cardio equipment working up to 30 minutes (recumbent bike and treadmill)   Gaze Stabilization: Tip Card 1.Target must remain in focus, not blurry, and appear stationary while head is in motion. 2.Perform exercises with small head movements (45 to either side of midline). 3.Increase speed of head motion so long as target is in focus. 4.If you wear eyeglasses, be sure you can see target through lens (therapist will give specific instructions for bifocal / progressive lenses). 5.These exercises may provoke dizziness or nausea. Work through these symptoms. If too dizzy, slow head movement slightly. Rest between each exercise. 6.Exercises demand concentration; avoid distractions. 7.For safety, perform standing exercises close to a counter, wall, corner, or next to someone.  Copyright  VHI. All rights reserved.  Gaze Stabilization: Standing Feet Apart   Feet shoulder width apart, keeping eyes on target on wall 3 feet away, tilt head down slightly and move head side to side for 30 seconds. Repeat while moving head up and down for 30 seconds. Do 2 sessions per day.   Copyright  VHI. All rights reserved.   Feet Together (Compliant Surface) Head Motion - Eyes Open    With eyes open, standing on compliant surface: __pillow______, feet together, move head slowly side to side. Repeat __10__ times per session. Do __2__ sessions per day.  Copyright  VHI. All rights reserved.   SINGLE LIMB STANCE    Stance: single leg on floor. Raise leg. Hold _10__ seconds. Repeat with other leg. _5__ reps per set.  Repeat 2 times per day. *Hold onto countertop intermittently with hands as needed.  Copyright  VHI. All rights reserved.   Backward Walking    Walk backward, toes of each foot coming down first. Take long, even strides. Make sure you have a clear pathway with no obstructions when you do this. In hallway near wall for support.   Copyright  VHI. All rights reserved.     Functional Quadriceps: Sit to Stand    Sit on edge of chair, feet flat on floor. Stand upright, extending knees fully. Repeat __20_ times per set. Do __2__ sessions per day.  http://orth.exer.us/734   Copyright  VHI. All rights reserved.

## 2015-09-20 DIAGNOSIS — R4781 Slurred speech: Secondary | ICD-10-CM | POA: Diagnosis not present

## 2015-09-20 DIAGNOSIS — E119 Type 2 diabetes mellitus without complications: Secondary | ICD-10-CM | POA: Diagnosis not present

## 2015-09-20 DIAGNOSIS — R269 Unspecified abnormalities of gait and mobility: Secondary | ICD-10-CM | POA: Diagnosis not present

## 2015-09-20 DIAGNOSIS — R930 Abnormal findings on diagnostic imaging of skull and head, not elsewhere classified: Secondary | ICD-10-CM | POA: Diagnosis not present

## 2015-09-20 DIAGNOSIS — J069 Acute upper respiratory infection, unspecified: Secondary | ICD-10-CM | POA: Diagnosis not present

## 2015-09-21 ENCOUNTER — Ambulatory Visit: Payer: PPO | Admitting: Physical Therapy

## 2015-09-21 DIAGNOSIS — R42 Dizziness and giddiness: Secondary | ICD-10-CM | POA: Diagnosis not present

## 2015-09-21 DIAGNOSIS — R2681 Unsteadiness on feet: Secondary | ICD-10-CM

## 2015-09-22 NOTE — Therapy (Signed)
[facility] [address] [place], [place], [address] Phone: [phone]   Fax:  [phone]  Physical Therapy Treatment  Patient Details  Name: Alan [name] MRN: [id number] Date of Birth: 07-08-1940 Referring Provider: Anastasia Pall, MD  Encounter Date: 09/21/2015      PT End of Session - 09/22/15 1943    Visit Number 7   Number of Visits 17   Date for PT Re-Evaluation 10/17/05   Authorization Type Medicare G-code   PT Start Time 0934   PT Stop Time 1017   PT Time Calculation (min) 43 min   Equipment Utilized During Treatment Gait belt      Past Medical History:  Diagnosis Date  . A-fib ([place])   . Anticoagulant long-term use, on coumadin for PAF 03/08/2012  . Chronic back pain   . Complete heart block ([place])   . Coronary artery disease    seen at [facility] cardiology   . Diabetes mellitus   . Hx of echocardiogram 06/29/2008   EF 45-50%   . Hypertension    Dr. Veronia Beets, primary  . Neuromuscular disorder ([place])    carpal tunnel bilaterally  . 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 Polio    hx of  . Sleep apnea    cpap, 11, last sleep study Jan2013, sees Dr. Keturah Barre  . Status post placement of cardiac pacemaker, 03/07/12 New Medtronic Gen and new Vent lead original placed 2008 03/08/2012    Past Surgical History:  Procedure Laterality Date  . APPENDECTOMY    . BACK SURGERY    . CARDIAC CATHETERIZATION    . CORONARY ARTERY BYPASS GRAFT  1990  . CORONARY ARTERY BYPASS GRAFT  2004  . ESOPHAGOGASTRODUODENOSCOPY Left 03/18/2013   Procedure: ESOPHAGOGASTRODUODENOSCOPY (EGD);  Surgeon: Cleotis Nipper, MD;  Location: [facility] ENDOSCOPY;  Service: Endoscopy;  Laterality: Left;  . FRACTURE SURGERY     Hx: of right leg  . HERNIA REPAIR    . INSERT / REPLACE / REMOVE PACEMAKER  03/07/2012   replaced  . JOINT REPLACEMENT     Hx: of left knee  . LEFT HEART CATHETERIZATION WITH CORONARY/GRAFT ANGIOGRAM N/A  12/21/2010   Procedure: LEFT HEART CATHETERIZATION WITH Beatrix Fetters;  Surgeon: Leonie Man, MD;  Location: [facility] CATH LAB;  Service: Cardiovascular;  Laterality: N/A;  . LUMBAR LAMINECTOMY/DECOMPRESSION MICRODISCECTOMY  01/10/2012   Procedure: LUMBAR LAMINECTOMY/DECOMPRESSION MICRODISCECTOMY 1 LEVEL;  Surgeon: [name], MD;  Location: [facility] NEURO ORS;  Service: Neurosurgery;  Laterality: Bilateral;  Lumbar three-four decompression, Posterior lateral fusion lumbar three-four, posterior spinus plate lumbar three-four  . MAZE Procedure  2004  . NASAL SEPTUM SURGERY N/A 14  . PACEMAKER INSERTION  08, 1/14  . PACEMAKER REVISION N/A 03/07/2012   Procedure: PACEMAKER REVISION;  Surgeon: Sanda Klein, MD;  Location: [facility] CATH LAB;  Service: Cardiovascular;  Laterality: N/A;  . POSTERIOR FUSION LUMBAR SPINE  08/28/2012   Dr Ronnald Ramp  . SHOULDER ARTHROSCOPY WITH OPEN ROTATOR CUFF REPAIR Right 03/30/2014   Procedure: RIGHT SHOULDER ARTHROSCOPY  OPEN ROTATOR CUFF REPAIR;  Surgeon: Kerin Salen, MD;  Location: [facility];  Service: Orthopedics;  Laterality: Right;  . SHOULDER OPEN ROTATOR CUFF REPAIR Right 03/30/2014   Procedure: ROTATOR CUFF REPAIR SHOULDER OPEN;  Surgeon: Kerin Salen, MD;  Location: [facility];  Service: Orthopedics;  Laterality: Right;  . STENTS    . TONSILLECTOMY      There were no vitals filed for this visit.  Balance Exercises - 09/22/15 1941      Balance Exercises: Standing   Standing Eyes Closed Narrow base of support (BOS);Wide (BOA);Head turns;Foam/compliant surface;5 reps  with head turns   Tandem Stance Eyes open;Foam/compliant surface;3 reps;10 secs   Other Standing Exercises marching in place with head turns on blue mat on floor with CGA     Standing on blue mat on floor - crossovers front and back - 5 reps each in place with CGA Standing on incline - marching in place, marching with EO and then with EC with  horizontal and vertical head turns with  CGA to min assist for recovery of LOB Standing in corner on AirEx - trunk rotation with EO and EC with CGA Alternate stepping up and back on incline and then on decline with CGA        PT Short Term Goals - 09/01/15 1000      PT SHORT TERM GOAL #1   Title Patient is independent in initial HEP. (Target Date: 09/17/2015)   Baseline Met on 09/01/2015 with patient working on balance at home.   Time 4   Period Weeks   Status Achieved     PT SHORT TERM GOAL #2   Title Berg Balance >/= 40/56 to indicate lower fall risk. (Target Date: 09/17/2015)   Baseline Met on 09/01/2015 improving to 49/56.    Time 4   Period Weeks   Status Achieved     PT SHORT TERM GOAL #3   Title Patient is able to ambulate while scanning environment without balance losses.(Target Date: 09/17/2015)   Baseline Met on 09/01/2015 with patient demonstrating horizontal turns every 3 steps without loss of balance.   Time 4   Period Weeks   Status Achieved           PT Long Term Goals - 09/08/15 1434      PT LONG TERM GOAL #1   Title Patient is independent in ongoing HEP / fitness plan. (Target Date: 10/15/2015)   Time 8   Period Weeks   Status On-going     PT LONG TERM GOAL #2   Title Berg Balance >45/56 to indicate lower fall risk. (Target Date: 10/15/2015)   Baseline Met on 09/01/2015 improving from 36/56 up to 49/56.    Time 4   Period Weeks   Status Achieved     PT LONG TERM GOAL #3   Title Dynamic Gait Index >17/24 to indicate lower fall risk. (Target Date: 10/15/2015)   Baseline Improved to 21/24 from 11/24.   Time 8   Period Weeks   Status Achieved     PT LONG TERM GOAL #4   Title Activities of Balance Confidence improves >10% to indicate improved confidence. (Target Date: 10/15/2015)   Time 8   Period Weeks   Status On-going     PT LONG TERM GOAL #5   Title Patient reports no falls in last 2 weeks. (Target Date: 10/15/2015)   Time 8   Period Weeks   Status  On-going               Plan - 09/22/15 1943    Clinical Impression Statement Pt reported no vertigo during treatment session: maintained balance well with vestibular exercises with min assist occasionally needed for recovery of LOB on compliant surface   Rehab Potential Good   PT Frequency 2x / week   PT Duration 8 weeks   PT Treatment/Interventions ADLs/Self Care Home Management;DME Instruction;Gait training;Stair training;Functional mobility training;Therapeutic  activities;Therapeutic exercise;Balance training;Neuromuscular re-education;Patient/family education   PT Next Visit Plan Progress multi-sensory balance training, dynamic gait training, encourage transition to community/he was going to check into silver sneakers.  Continue preparing for d/c- anticipate earlier d/c than goal date.   Consulted and Agree with Plan of Care Patient      Patient will benefit from skilled therapeutic intervention in order to improve the following deficits and impairments:  Abnormal gait, Decreased activity tolerance, Decreased balance, Decreased mobility, Decreased strength, Dizziness, Postural dysfunction  Visit Diagnosis: Unsteadiness on feet     Problem List Patient Active Problem List   Diagnosis Date Noted  . GI bleed 03/19/2013  . Supratherapeutic INR 03/17/2013  . Melena 03/17/2013  . CHB (complete heart block) ([place]) 10/13/2012  . Cough 10/03/2012  . Dyslipidemia 07/11/2012  . Preop cardiovascular exam 07/11/2012  . Ischemic cardiomyopathy 07/11/2012  . Pacemaker - Medtronic Adapta dual chamber 03/08/2012  . Anticoagulant long-term use, on coumadin for PAF 03/08/2012  . Non-sustained ventricular tachycardia ([place]) 12/21/2010  . Hypertension 12/21/2010  . Sinus arrest   . DYSPNEA ON EXERTION 12/23/2009  . 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 DIABETES MELLITUS 12/12/2007  . Obstructive sleep apnea 12/12/2007  . Coronary atherosclerosis 12/12/2007  . ATRIAL FIBRILLATION,  PAROXYSMAL 12/12/2007  . CORONARY ARTERY BYPASS GRAFT, HX OF, in 1990 and Re-do CABG 2004 with Atrial Maze procedure,  12/12/2007    , [place], PT 09/22/2015, 7:46 PM  [facility] [address] [place], [place], [address] Phone: [phone]   Fax:  [phone]  Name: Alan [name] MRN: [id number] Date of Birth: 1940-07-20

## 2015-09-23 ENCOUNTER — Ambulatory Visit: Payer: PPO | Admitting: Physical Therapy

## 2015-09-23 DIAGNOSIS — R42 Dizziness and giddiness: Secondary | ICD-10-CM | POA: Diagnosis not present

## 2015-09-23 DIAGNOSIS — R2681 Unsteadiness on feet: Secondary | ICD-10-CM

## 2015-09-23 DIAGNOSIS — R2689 Other abnormalities of gait and mobility: Secondary | ICD-10-CM

## 2015-09-26 NOTE — Therapy (Signed)
[facility] [address] [place], [place], [address] Phone: [phone]   Fax:  [phone]  Physical Therapy Treatment  Patient Details  Name: TERRACE FONTANILLA MRN: [id number] Date of Birth: 1940/04/20 Referring Provider: Anastasia Pall, MD  Encounter Date: 09/23/2015      PT End of Session - 09/26/15 2045    Visit Number 8   Number of Visits 17   Date for PT Re-Evaluation 10/17/05   Authorization Type Medicare G-code   PT Start Time 0932   PT Stop Time 1015   PT Time Calculation (min) 43 min      Past Medical History:  Diagnosis Date  . A-fib ([place])   . Anticoagulant long-term use, on coumadin for PAF 03/08/2012  . Chronic back pain   . Complete heart block ([place])   . Coronary artery disease    seen at [facility] cardiology   . Diabetes mellitus   . Hx of echocardiogram 06/29/2008   EF 45-50%   . Hypertension    Dr. Veronia Beets, primary  . Neuromuscular disorder ([place])    carpal tunnel bilaterally  . 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 Polio    hx of  . Sleep apnea    cpap, 11, last sleep study Jan2013, sees Dr. Keturah Barre  . Status post placement of cardiac pacemaker, 03/07/12 New Medtronic Gen and new Vent lead original placed 2008 03/08/2012    Past Surgical History:  Procedure Laterality Date  . APPENDECTOMY    . BACK SURGERY    . CARDIAC CATHETERIZATION    . CORONARY ARTERY BYPASS GRAFT  1990  . CORONARY ARTERY BYPASS GRAFT  2004  . ESOPHAGOGASTRODUODENOSCOPY Left 03/18/2013   Procedure: ESOPHAGOGASTRODUODENOSCOPY (EGD);  Surgeon: Cleotis Nipper, MD;  Location: [facility] ENDOSCOPY;  Service: Endoscopy;  Laterality: Left;  . FRACTURE SURGERY     Hx: of right leg  . HERNIA REPAIR    . INSERT / REPLACE / REMOVE PACEMAKER  03/07/2012   replaced  . JOINT REPLACEMENT     Hx: of left knee  . LEFT HEART CATHETERIZATION WITH CORONARY/GRAFT ANGIOGRAM N/A 12/21/2010   Procedure: LEFT HEART CATHETERIZATION  WITH Beatrix Fetters;  Surgeon: Leonie Man, MD;  Location: [facility] CATH LAB;  Service: Cardiovascular;  Laterality: N/A;  . LUMBAR LAMINECTOMY/DECOMPRESSION MICRODISCECTOMY  01/10/2012   Procedure: LUMBAR LAMINECTOMY/DECOMPRESSION MICRODISCECTOMY 1 LEVEL;  Surgeon: [name], MD;  Location: [facility] NEURO ORS;  Service: Neurosurgery;  Laterality: Bilateral;  Lumbar three-four decompression, Posterior lateral fusion lumbar three-four, posterior spinus plate lumbar three-four  . MAZE Procedure  2004  . NASAL SEPTUM SURGERY N/A 14  . PACEMAKER INSERTION  08, 1/14  . PACEMAKER REVISION N/A 03/07/2012   Procedure: PACEMAKER REVISION;  Surgeon: Sanda Klein, MD;  Location: [facility] CATH LAB;  Service: Cardiovascular;  Laterality: N/A;  . POSTERIOR FUSION LUMBAR SPINE  08/28/2012   Dr Ronnald Ramp  . SHOULDER ARTHROSCOPY WITH OPEN ROTATOR CUFF REPAIR Right 03/30/2014   Procedure: RIGHT SHOULDER ARTHROSCOPY  OPEN ROTATOR CUFF REPAIR;  Surgeon: Kerin [place]en, MD;  Location: [facility];  Service: Orthopedics;  Laterality: Right;  . SHOULDER OPEN ROTATOR CUFF REPAIR Right 03/30/2014   Procedure: ROTATOR CUFF REPAIR SHOULDER OPEN;  Surgeon: Kerin [place]en, MD;  Location: [facility];  Service: Orthopedics;  Laterality: Right;  . STENTS    . TONSILLECTOMY      There were no vitals filed for this visit.      Subjective Assessment -  09/26/15 2040    Subjective Pt states he is going to [place], [place] on Sunday for 2 weeks - wants to finish up today   Pertinent History polio (pt reports RLE weakness & leg length discrepency as result & no diagnosis of post-polio by any physician), DM, A-Fib, complete heart block, EF 45-50%, CAD, HTN, LBP, sleep apnea, Bil carpal tunnel, pacemaker, CABG, Back surgery lumbar lamenctomy 01/10/12 & fusion 08/28/12, hernia, bil. rotator cuff tear with right repair   Patient Stated Goals Not be dizzy & be able to balance.    Currently in Pain? No/denies                               Balance Exercises - 09/26/15 2044      Balance Exercises: Standing   Standing Eyes Closed Narrow base of support (BOS);Wide (BOA);Head turns;Foam/compliant surface;5 reps  with head turns   Tandem Stance Eyes open;Foam/compliant surface;3 reps;10 secs   Rockerboard Anterior/posterior   Other Standing Exercises marching in place with head turns on blue mat on floor with CGA      Standing on BOSU - moving each leg up/back and laterally with minimal UE support x 10 reps each       PT Short Term Goals - 09/01/15 1000      PT SHORT TERM GOAL #1   Title Patient is independent in initial HEP. (Target Date: 09/17/2015)   Baseline Met on 09/01/2015 with patient working on balance at home.   Time 4   Period Weeks   Status Achieved     PT SHORT TERM GOAL #2   Title Berg Balance >/= 40/56 to indicate lower fall risk. (Target Date: 09/17/2015)   Baseline Met on 09/01/2015 improving to 49/56.    Time 4   Period Weeks   Status Achieved     PT SHORT TERM GOAL #3   Title Patient is able to ambulate while scanning environment without balance losses.(Target Date: 09/17/2015)   Baseline Met on 09/01/2015 with patient demonstrating horizontal turns every 3 steps without loss of balance.   Time 4   Period Weeks   Status Achieved           PT Long Term Goals - 09/26/15 2043      PT LONG TERM GOAL #1   Title Patient is independent in ongoing HEP / fitness plan. (Target Date: 10/15/2015)   Status Achieved     PT LONG TERM GOAL #2   Title Berg Balance >45/56 to indicate lower fall risk. (Target Date: 10/15/2015)   Baseline Met on 09/01/2015 improving from 36/56 up to 49/56.    Status Achieved     PT LONG TERM GOAL #3   Title Dynamic Gait Index >17/24 to indicate lower fall risk. (Target Date: 10/15/2015)   Baseline Improved to 21/24 from 11/24.   Status Achieved     PT LONG TERM GOAL #4   Title Activities of Balance Confidence improves >10% to  indicate improved confidence. (Target Date: 10/15/2015)   Status Deferred     PT LONG TERM GOAL #5   Title Patient reports no falls in last 2 weeks. (Target Date: 10/15/2015)   Status Achieved               Plan - 09/26/15 2046    Clinical Impression Statement Pt has met all LTG's except goal #4 which was deferred due to time constraint - balance and gait are  WNL's   Rehab Potential Good   PT Frequency 2x / week   PT Duration 8 weeks   PT Treatment/Interventions ADLs/Self Care Home Management;DME Instruction;Gait training;Stair training;Functional mobility training;Therapeutic activities;Therapeutic exercise;Balance training;Neuromuscular re-education;Patient/family education   Consulted and Agree with Plan of Care Patient      Patient will benefit from skilled therapeutic intervention in order to improve the following deficits and impairments:  Abnormal gait, Decreased activity tolerance, Decreased balance, Decreased mobility, Decreased strength, Dizziness, Postural dysfunction  Visit Diagnosis: Unsteadiness on feet  Other abnormalities of gait and mobility     Problem List Patient Active Problem List   Diagnosis Date Noted  . GI bleed 03/19/2013  . Supratherapeutic INR 03/17/2013  . Melena 03/17/2013  . CHB (complete heart block) ([place]) 10/13/2012  . Cough 10/03/2012  . Dyslipidemia 07/11/2012  . Preop cardiovascular exam 07/11/2012  . Ischemic cardiomyopathy 07/11/2012  . Pacemaker - Medtronic Adapta dual chamber 03/08/2012  . Anticoagulant long-term use, on coumadin for PAF 03/08/2012  . Non-sustained ventricular tachycardia ([place]) 12/21/2010  . Hypertension 12/21/2010  . Sinus arrest   . DYSPNEA ON EXERTION 12/23/2009  . 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 DIABETES MELLITUS 12/12/2007  . Obstructive sleep apnea 12/12/2007  . Coronary atherosclerosis 12/12/2007  . ATRIAL FIBRILLATION, PAROXYSMAL 12/12/2007  . CORONARY ARTERY BYPASS GRAFT, HX OF, in 1990 and Re-do  CABG 2004 with Atrial Maze procedure,  12/12/2007   PHYSICAL THERAPY DISCHARGE SUMMARY  Visits from Start of Care: 8  Current functional level related to goals / functional outcomes: See above for progress towards goals   Remaining deficits: Decr. High level balance skills   Education / Equipment: Pt has been instructed in a HEP for balance exercises. Plan: Patient agrees to discharge.  Patient goals were met. Patient is being discharged due to meeting the stated rehab goals.  ?????       CCEQFD, VOUZH QUIQNVV, PT 09/26/2015, 8:51 PM  [facility] [address] [place], [place], [address] Phone: [phone]   Fax:  [phone]  Name: MITCHELLE GOERNER MRN: [id number] Date of Birth: [birth year]

## 2015-09-27 ENCOUNTER — Ambulatory Visit (INDEPENDENT_AMBULATORY_CARE_PROVIDER_SITE_OTHER): Payer: PPO | Admitting: Neurology

## 2015-09-27 ENCOUNTER — Encounter: Payer: PPO | Admitting: Rehabilitative and Restorative Service Providers"

## 2015-09-27 ENCOUNTER — Encounter: Payer: Self-pay | Admitting: Neurology

## 2015-09-27 VITALS — BP 140/79 | HR 61 | Ht 70.0 in | Wt 214.2 lb

## 2015-09-27 DIAGNOSIS — Z951 Presence of aortocoronary bypass graft: Secondary | ICD-10-CM

## 2015-09-27 DIAGNOSIS — G4733 Obstructive sleep apnea (adult) (pediatric): Secondary | ICD-10-CM | POA: Diagnosis not present

## 2015-09-27 DIAGNOSIS — I480 Paroxysmal atrial fibrillation: Secondary | ICD-10-CM

## 2015-09-27 DIAGNOSIS — F05 Delirium due to known physiological condition: Secondary | ICD-10-CM | POA: Diagnosis not present

## 2015-09-27 DIAGNOSIS — R413 Other amnesia: Secondary | ICD-10-CM | POA: Diagnosis not present

## 2015-09-27 NOTE — Progress Notes (Signed)
PATIENT: Alan [name] DOB: 1940-05-20  Chief Complaint  Patient presents with  . Abnormal CT Scan    He is here with his signficant other, Ann.  He had an incident of slurred speech and dizziness.  They would like to review the findings of his recent CT scan.  He has been going to PT for gait training.  Reports history of problems due to history of childhood polio.       HISTORICAL  Alan [name] is a 75 years old right-handed male, retired Insurance underwriter, accompanied by his friend Alan [name], seen in refer by  [name] for evaluation of abnormal CAT scan.  I reviewed and summarized the referring note, he had a history of polio, atrial fibrillation, chronic kidney disease, history of upper GI bleeding, history of complete a chew ventricular block, status post pacemaker, hyperlipidemia, hypertension, history of coronary artery disease, aortal coronary bypass surgery, obstructive sleep apnea, uses CPAP machine, diabetes since 2010.   He woke up 3 weeks ago at the end of July 2017, was noted to have mild confusion, word finding difficulties, also agitated, episode last for a few minutes, there was no associated new lateralized motor or sensory deficit.  He was taken to the emergency room, referred for CAT scan of the brain, I have personally reviewed CAT scan of the brain August 2017 from [facility], there was questionable subtle right parietal signal changes, moderate generalized atrophy, periventricular white matter disease, not a candidate for MRI due to pacemaker.  I was able to review his echocardiogram in May 2015, ejection fraction 55-60%, mild dilated left ventricular cavity size.   I have reviewed and summarized his EDG report in May 2015 from [facility], he presented with acute blood loss anemia, hemoglobin dropped to 7.7, gastrointestinal bleeding, he was taking Coumadin at that time for atrial fibrillation, EGD revealed AVM, that was cauterized, he was taken off Coumadin, was put  on aspirin,  He was reevaluated in January 2017 for complains of dizziness, he was found to have blood in stool, with orthostatic blood pressure changes, he was given iron supplement, fluid intake, Hg was 9.8. His aspirin was stopped since. Most recent laboratory evaluation in June 2017 showed normal hemoglobin of 13   He reported a history of childhood polio with right leg atrophy, gait abnormality, few years ago, he gone through extensive right ankle reconstruction and fusion surgery, his gait has improved.  He also noticed significant short-term memory loss   REVIEW OF SYSTEMS: Full 14 system review of systems performed and notable only for dizziness, hearing loss, ringing ears  ALLERGIES: No Known Allergies  HOME MEDICATIONS: Current Outpatient Prescriptions  Medication Sig Dispense Refill  . carvedilol (COREG) 25 MG tablet TAKE TWO TABLETS BY MOUTH TWICE DAILY 360 tablet 2  . glipiZIDE (GLUCOTROL) 10 MG tablet Take 10 mg by mouth daily as needed (for high blood surgars). For high blood sugars    . insulin glargine (LANTUS) 100 UNIT/ML injection Inject 20 Units into the skin daily as needed (elevated blood sugars). Reported on 08/19/2015    . lisinopril-hydrochlorothiazide (PRINZIDE,ZESTORETIC) 20-25 MG tablet TAKE ONE TABLET BY MOUTH ONCE DAILY 90 tablet 3  . metFORMIN (GLUCOPHAGE) 500 MG tablet Take 1 tablet by mouth 4 (four) times daily.    . Multiple Vitamin (MULTIVITAMIN WITH MINERALS) TABS Take 1 tablet by mouth every morning. Reported on 08/19/2015    . Omega-3 Fatty Acids (FISH OIL) 1200 MG CAPS Take 1,200 mg by  mouth daily. Reported on 08/19/2015     No current facility-administered medications for this visit.     PAST MEDICAL HISTORY: Past Medical History:  Diagnosis Date  . A-fib ([place])   . Anticoagulant long-term use, on coumadin for PAF 03/08/2012  . Chronic back pain   . Complete heart block ([place])   . Coronary artery disease    seen at [facility] cardiology   .  Diabetes mellitus   . Hx of echocardiogram 06/29/2008   EF 45-50%   . Hypertension    Dr. Veronia Beets, primary  . Neuromuscular disorder ([place])    carpal tunnel bilaterally  . 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 Polio    hx of  . Sleep apnea    cpap, 11, last sleep study Jan2013, sees Dr. Keturah Barre  . Status post placement of cardiac pacemaker, 03/07/12 New Medtronic Gen and new Vent lead original placed 2008 03/08/2012    PAST SURGICAL HISTORY: Past Surgical History:  Procedure Laterality Date  . APPENDECTOMY    . BACK SURGERY    . CARDIAC CATHETERIZATION    . CORONARY ARTERY BYPASS GRAFT  1990  . CORONARY ARTERY BYPASS GRAFT  2004  . ESOPHAGOGASTRODUODENOSCOPY Left 03/18/2013   Procedure: ESOPHAGOGASTRODUODENOSCOPY (EGD);  Surgeon: Cleotis Nipper, MD;  Location: [facility] ENDOSCOPY;  Service: Endoscopy;  Laterality: Left;  . FRACTURE SURGERY     Hx: of right leg  . HERNIA REPAIR    . INSERT / REPLACE / REMOVE PACEMAKER  03/07/2012   replaced  . JOINT REPLACEMENT     Hx: of left knee  . LEFT HEART CATHETERIZATION WITH CORONARY/GRAFT ANGIOGRAM N/A 12/21/2010   Procedure: LEFT HEART CATHETERIZATION WITH Beatrix Fetters;  Surgeon: Leonie Man, MD;  Location: [facility] CATH LAB;  Service: Cardiovascular;  Laterality: N/A;  . LUMBAR LAMINECTOMY/DECOMPRESSION MICRODISCECTOMY  01/10/2012   Procedure: LUMBAR LAMINECTOMY/DECOMPRESSION MICRODISCECTOMY 1 LEVEL;  Surgeon: [name], MD;  Location: [facility] NEURO ORS;  Service: Neurosurgery;  Laterality: Bilateral;  Lumbar three-four decompression, Posterior lateral fusion lumbar three-four, posterior spinus plate lumbar three-four  . MAZE Procedure  2004  . NASAL SEPTUM SURGERY N/A 14  . PACEMAKER INSERTION  08, 1/14  . PACEMAKER REVISION N/A 03/07/2012   Procedure: PACEMAKER REVISION;  Surgeon: Sanda Klein, MD;  Location: [facility] CATH LAB;  Service: Cardiovascular;  Laterality: N/A;  . POSTERIOR FUSION LUMBAR SPINE  08/28/2012   Dr Ronnald Ramp  .  SHOULDER ARTHROSCOPY WITH OPEN ROTATOR CUFF REPAIR Right 03/30/2014   Procedure: RIGHT SHOULDER ARTHROSCOPY  OPEN ROTATOR CUFF REPAIR;  Surgeon: Kerin [place]en, MD;  Location: [facility];  Service: Orthopedics;  Laterality: Right;  . SHOULDER OPEN ROTATOR CUFF REPAIR Right 03/30/2014   Procedure: ROTATOR CUFF REPAIR SHOULDER OPEN;  Surgeon: Kerin Salen, MD;  Location: [facility];  Service: Orthopedics;  Laterality: Right;  . STENTS    . TONSILLECTOMY      FAMILY HISTORY: Family History  Problem Relation Age of Onset  . Other Father     MVA  . Cancer Mother     SOCIAL HISTORY:  Social History   Social History  . Marital status: Single    Spouse name: N/A  . Number of children: 1  . Years of education: 28   Occupational History  . pilot-retired    Social History Main Topics  . Smoking status: Never Smoker  . Smokeless tobacco: Never Used  . Alcohol use No  . Drug use: No  . Sexual activity:  Yes   Other Topics Concern  . Not on file   Social History Narrative   [place] [address] [place] [facility], fishes.    Right-handed.   1 cup caffeine per day.     PHYSICAL EXAM   Vitals:   09/27/15 1030  BP: 140/79  Pulse: 61  Weight: 214 lb 4 oz (97.2 kg)  Height: 5\' 10"  (1.778 m)    Not recorded      Body mass index is 30.74 kg/m.  PHYSICAL EXAMNIATION:  Gen: NAD, conversant, well nourised, obese, well groomed                     Cardiovascular: Regular rate rhythm, no peripheral edema, warm, nontender. Eyes: Conjunctivae clear without exudates or hemorrhage Neck: Supple, no carotid bruise. Pulmonary: Clear to auscultation bilaterally   NEUROLOGICAL EXAM:  MENTAL STATUS: Speech:    Speech is normal; fluent and spontaneous with normal comprehension.  Cognition:Mini-Mental Status Examination was 29/30, animal naming 18     Orientation to time, place and person     Recent and remote memory: He missed 1/3 recall     Normal Attention span and concentration     Normal  Language, naming, repeating,spontaneous speech     Fund of knowledge   CRANIAL NERVES: CN II: Visual fields are full to confrontation. Fundoscopic exam is normal with sharp discs and no vascular changes. Pupils are round equal and briskly reactive to light. CN III, IV, VI: extraocular movement are normal. No ptosis. CN V: Facial sensation is intact to pinprick in all 3 divisions bilaterally. Corneal responses are intact.  CN VII: Face is symmetric with normal eye closure and smile. CN VIII: Hearing is normal to rubbing fingers CN IX, X: Palate elevates symmetrically. Phonation is normal. CN XI: Head turning and shoulder shrug are intact CN XII: Tongue is midline with normal movements and no atrophy.  MOTOR: He has significant right lower extremity atrophy, fixed right ankle, there was no significant right lower extremity proximal and distal weakness.  REFLEXES: Reflexes are 2+ and symmetric at the biceps, triceps, knees, and ankles. Plantar responses are flexor.  SENSORY: Intact to light touch, pinprick, positional sensation and vibratory sensation are intact in fingers and toes.  COORDINATION: Rapid alternating movements and fine finger movements are intact. There is no dysmetria on finger-to-nose and heel-knee-shin.    GAIT/STANCE: Fixed right ankle, mildly unsteady, dragging right leg  DIAGNOSTIC DATA (LABS, IMAGING, TESTING) - I reviewed patient records, labs, notes, testing and imaging myself where available.   ASSESSMENT AND PLAN  KALEIL HANSELL is a 75 y.o. male   Transient language difficulty  Differentiation diagnosis includes worsening confusion from his baseline, TIAs involving left frontal region, medicine side effect  He has multiple vascular risk factors, including age, hypertension, hyperlipidemia, diabetes, coronary artery disease, obstructive sleep apnea, atrial fibrillation  I have suggested him to restart aspirin 81 mg daily   he had a history of significant  GI bleeding the past, chronic anticoagulation acute Alan [name] was stopped following his GI bleeding, AVM well formation require cauterization in 2015, aspirin was stopped in January 2017 following another episode of GI bleeding, I have asked him to observing his stool color carefully, if he noticed black stool, he should call his GI or  primary care doctor  Worsening memory loss  Minimental status examination 29/30 today,   Labatory evaluations   Marcial Pacas, M.D. Ph.D.  [facility] [address]  [place], [place] [address] Ph: [phone] Fax: [phone]  CC: [name], MD

## 2015-09-28 LAB — TSH: TSH: 1.52 u[IU]/mL (ref 0.450–4.500)

## 2015-09-28 LAB — VITAMIN B12: Vitamin B-12: 306 pg/mL (ref 211–946)

## 2015-09-28 LAB — VITAMIN D 25 HYDROXY (VIT D DEFICIENCY, FRACTURES): Vit D, 25-Hydroxy: 25.6 ng/mL — ABNORMAL LOW (ref 30.0–100.0)

## 2015-09-28 LAB — RPR: RPR Ser Ql: NONREACTIVE

## 2015-09-29 ENCOUNTER — Encounter: Payer: PPO | Admitting: Rehabilitative and Restorative Service Providers"

## 2015-09-29 DIAGNOSIS — E118 Type 2 diabetes mellitus with unspecified complications: Secondary | ICD-10-CM | POA: Diagnosis not present

## 2015-09-29 DIAGNOSIS — J069 Acute upper respiratory infection, unspecified: Secondary | ICD-10-CM | POA: Diagnosis not present

## 2015-09-29 DIAGNOSIS — J309 Allergic rhinitis, unspecified: Secondary | ICD-10-CM | POA: Diagnosis not present

## 2015-09-29 DIAGNOSIS — E1165 Type 2 diabetes mellitus with hyperglycemia: Secondary | ICD-10-CM | POA: Diagnosis not present

## 2015-09-30 DIAGNOSIS — J309 Allergic rhinitis, unspecified: Secondary | ICD-10-CM | POA: Insufficient documentation

## 2015-10-04 ENCOUNTER — Ambulatory Visit (INDEPENDENT_AMBULATORY_CARE_PROVIDER_SITE_OTHER): Payer: PPO | Admitting: Neurology

## 2015-10-04 ENCOUNTER — Other Ambulatory Visit: Payer: Self-pay | Admitting: Cardiovascular Disease

## 2015-10-04 ENCOUNTER — Encounter: Payer: PPO | Admitting: Rehabilitative and Restorative Service Providers"

## 2015-10-04 DIAGNOSIS — R413 Other amnesia: Secondary | ICD-10-CM

## 2015-10-04 DIAGNOSIS — F05 Delirium due to known physiological condition: Secondary | ICD-10-CM

## 2015-10-04 DIAGNOSIS — R410 Disorientation, unspecified: Secondary | ICD-10-CM

## 2015-10-04 DIAGNOSIS — Z951 Presence of aortocoronary bypass graft: Secondary | ICD-10-CM

## 2015-10-04 DIAGNOSIS — I480 Paroxysmal atrial fibrillation: Secondary | ICD-10-CM

## 2015-10-04 DIAGNOSIS — G4733 Obstructive sleep apnea (adult) (pediatric): Secondary | ICD-10-CM

## 2015-10-04 NOTE — Procedures (Signed)
    History:  Alan [name] is a 75 year old gentleman with a history of polio, atrial fibrillation, diabetes, and chronic kidney disease. The patient had an episode in July 2017 of mild confusion, word finding difficulties and agitation. Episode lasted several minutes and then resolved. The patient is being evaluated for this event.  This is a routine EEG. No skull defects are noted. Medications include Coreg, Glucotrol, insulin, Prinzide, metformin, multivitamins, and fish oil.   EEG classification: Normal awake and drowsy  Description of the recording: The background rhythms of this recording consists of a fairly well modulated medium amplitude alpha rhythm of 9 Hz that is reactive to eye opening and closure. As the record progresses, the patient appears to remain in the waking state throughout the recording. Photic stimulation was performed, resulting in a bilateral and symmetric photic driving response. Hyperventilation was also performed, resulting in a minimal buildup of the background rhythm activities without significant slowing seen. Toward the end of the recording, the patient enters the drowsy state with slight symmetric slowing seen. The patient never enters stage II sleep. At no time during the recording does there appear to be evidence of spike or spike wave discharges or evidence of focal slowing. EKG monitor shows no evidence of cardiac rhythm abnormalities with a heart rate of 60.  Impression: This is a normal EEG recording in the waking and drowsy state. No evidence of ictal or interictal discharges are seen.

## 2015-10-04 NOTE — Telephone Encounter (Signed)
Rx request sent to pharmacy.  

## 2015-10-06 ENCOUNTER — Encounter: Payer: PPO | Admitting: Rehabilitative and Restorative Service Providers"

## 2015-10-13 ENCOUNTER — Encounter: Payer: PPO | Admitting: Rehabilitative and Restorative Service Providers"

## 2015-10-15 ENCOUNTER — Encounter: Payer: PPO | Admitting: Rehabilitative and Restorative Service Providers"

## 2015-10-15 DIAGNOSIS — L304 Erythema intertrigo: Secondary | ICD-10-CM | POA: Diagnosis not present

## 2015-10-15 DIAGNOSIS — L570 Actinic keratosis: Secondary | ICD-10-CM | POA: Diagnosis not present

## 2015-10-15 DIAGNOSIS — D239 Other benign neoplasm of skin, unspecified: Secondary | ICD-10-CM | POA: Diagnosis not present

## 2015-10-19 ENCOUNTER — Encounter: Payer: PPO | Admitting: Physical Therapy

## 2015-10-21 ENCOUNTER — Encounter: Payer: PPO | Admitting: Physical Therapy

## 2015-11-04 ENCOUNTER — Other Ambulatory Visit: Payer: Self-pay

## 2015-11-04 ENCOUNTER — Telehealth: Payer: Self-pay | Admitting: Cardiovascular Disease

## 2015-11-04 ENCOUNTER — Ambulatory Visit (HOSPITAL_COMMUNITY): Payer: PPO | Attending: Cardiology

## 2015-11-04 DIAGNOSIS — Z951 Presence of aortocoronary bypass graft: Secondary | ICD-10-CM | POA: Insufficient documentation

## 2015-11-04 DIAGNOSIS — G4733 Obstructive sleep apnea (adult) (pediatric): Secondary | ICD-10-CM | POA: Insufficient documentation

## 2015-11-04 DIAGNOSIS — E785 Hyperlipidemia, unspecified: Secondary | ICD-10-CM | POA: Diagnosis not present

## 2015-11-04 DIAGNOSIS — I255 Ischemic cardiomyopathy: Secondary | ICD-10-CM

## 2015-11-04 DIAGNOSIS — I340 Nonrheumatic mitral (valve) insufficiency: Secondary | ICD-10-CM | POA: Diagnosis not present

## 2015-11-04 DIAGNOSIS — Z0289 Encounter for other administrative examinations: Secondary | ICD-10-CM | POA: Diagnosis not present

## 2015-11-04 DIAGNOSIS — I119 Hypertensive heart disease without heart failure: Secondary | ICD-10-CM | POA: Diagnosis not present

## 2015-11-04 DIAGNOSIS — I472 Ventricular tachycardia: Secondary | ICD-10-CM | POA: Diagnosis not present

## 2015-11-04 DIAGNOSIS — I442 Atrioventricular block, complete: Secondary | ICD-10-CM | POA: Insufficient documentation

## 2015-11-04 DIAGNOSIS — I2510 Atherosclerotic heart disease of native coronary artery without angina pectoris: Secondary | ICD-10-CM | POA: Diagnosis not present

## 2015-11-04 DIAGNOSIS — I429 Cardiomyopathy, unspecified: Secondary | ICD-10-CM | POA: Diagnosis not present

## 2015-11-04 NOTE — Telephone Encounter (Signed)
PATIENT AWARE , SCHEDULER WILL CALL

## 2015-11-04 NOTE — Telephone Encounter (Signed)
Informed patient - will have to defer to Dr Sallyanne Kuster Patient states he found yesterday he needs echo  before next Tuesday 11/09/15 - for D.O.T.  Will call patient back

## 2015-11-04 NOTE — Telephone Encounter (Signed)
Please schedule ASAP, Thanks

## 2015-11-04 NOTE — Telephone Encounter (Signed)
New message   Pt verbalized that he needs an Echo order for the [employer]

## 2015-11-08 ENCOUNTER — Telehealth: Payer: Self-pay | Admitting: Cardiovascular Disease

## 2015-11-08 ENCOUNTER — Encounter: Payer: Self-pay | Admitting: Cardiovascular Disease

## 2015-11-08 DIAGNOSIS — E118 Type 2 diabetes mellitus with unspecified complications: Secondary | ICD-10-CM | POA: Diagnosis not present

## 2015-11-08 DIAGNOSIS — Z23 Encounter for immunization: Secondary | ICD-10-CM | POA: Diagnosis not present

## 2015-11-08 DIAGNOSIS — I10 Essential (primary) hypertension: Secondary | ICD-10-CM | POA: Diagnosis not present

## 2015-11-08 DIAGNOSIS — E785 Hyperlipidemia, unspecified: Secondary | ICD-10-CM | POA: Diagnosis not present

## 2015-11-08 DIAGNOSIS — Z794 Long term (current) use of insulin: Secondary | ICD-10-CM | POA: Diagnosis not present

## 2015-11-08 NOTE — Telephone Encounter (Signed)
Letter in Epic addressed to PCP can be faxed over

## 2015-11-08 NOTE — Telephone Encounter (Signed)
Advised patient and Alan [name]

## 2015-11-08 NOTE — Telephone Encounter (Signed)
Sent via [employer] as requested

## 2015-11-08 NOTE — Telephone Encounter (Signed)
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

## 2015-11-08 NOTE — Telephone Encounter (Signed)
New message    Pt calling to talk to the nurse about having a doppler done in order to maintain his license. Please call.

## 2015-11-11 ENCOUNTER — Encounter: Payer: Self-pay | Admitting: Cardiovascular Disease

## 2015-11-11 ENCOUNTER — Ambulatory Visit (INDEPENDENT_AMBULATORY_CARE_PROVIDER_SITE_OTHER): Payer: PPO | Admitting: Cardiovascular Disease

## 2015-11-11 VITALS — BP 130/62 | HR 66 | Ht 70.0 in | Wt 217.0 lb

## 2015-11-11 DIAGNOSIS — Z950 Presence of cardiac pacemaker: Secondary | ICD-10-CM

## 2015-11-11 DIAGNOSIS — I442 Atrioventricular block, complete: Secondary | ICD-10-CM

## 2015-11-11 DIAGNOSIS — I255 Ischemic cardiomyopathy: Secondary | ICD-10-CM | POA: Diagnosis not present

## 2015-11-11 DIAGNOSIS — I472 Ventricular tachycardia: Secondary | ICD-10-CM

## 2015-11-11 DIAGNOSIS — I10 Essential (primary) hypertension: Secondary | ICD-10-CM

## 2015-11-11 DIAGNOSIS — E669 Obesity, unspecified: Secondary | ICD-10-CM

## 2015-11-11 DIAGNOSIS — I4729 Other ventricular tachycardia: Secondary | ICD-10-CM

## 2015-11-11 DIAGNOSIS — Z951 Presence of aortocoronary bypass graft: Secondary | ICD-10-CM | POA: Diagnosis not present

## 2015-11-11 DIAGNOSIS — I480 Paroxysmal atrial fibrillation: Secondary | ICD-10-CM

## 2015-11-11 DIAGNOSIS — E1169 Type 2 diabetes mellitus with other specified complication: Secondary | ICD-10-CM

## 2015-11-11 DIAGNOSIS — I495 Sick sinus syndrome: Secondary | ICD-10-CM | POA: Diagnosis not present

## 2015-11-11 DIAGNOSIS — E7800 Pure hypercholesterolemia, unspecified: Secondary | ICD-10-CM

## 2015-11-11 NOTE — Patient Instructions (Addendum)
Dr Sallyanne Kuster recommends that you continue on your current medications as directed. Please refer to the Current Medication list given to you today.  Remote monitoring is used to monitor your Pacemaker of ICD from home. This monitoring reduces the number of office visits required to check your device to one time per year. It allows [place] to keep an eye on the functioning of your device to ensure it is working properly. You are scheduled for a device check from home on Thursday, January 4th, 2018. You may send your transmission at any time that day. If you have a wireless device, the transmission will be sent automatically. After your physician reviews your transmission, you will receive a postcard with your next transmission date.  Dr Sallyanne Kuster recommends that you schedule a follow-up appointment in 6 months with a pacemaker check. You will receive a reminder letter in the mail two months in advance. If you don't receive a letter, please call our office to schedule the follow-up appointment.  If you need a refill on your cardiac medications before your next appointment, please call your pharmacy.

## 2015-11-11 NOTE — Progress Notes (Signed)
Patient ID: Alan [name], male   DOB: 1940/03/06, 75 y.o.   MRN: [id number]     Cardiology Office Note   Date:  11/11/2015   ID:  Alan [name], DOB 10-24-1940, MRN [id number]  PCP:  [name], MD  Cardiologist:   Sanda Klein, MD   No chief complaint on file.     History of Present Illness: Alan [name] is a 75 y.o. male who presents for pacemaker and CAD follow up.   He denies any chest pain at rest exertion, dyspnea at rest or with exertion, orthopnea, paroxysmal nocturnal dyspnea, syncope, palpitations, focal neurological deficits, intermittent claudication, lower extremity edema, unexplained weight gain, cough, hemoptysis or wheezing.  Interrogation of his pacemaker shows normal device function. The presenting rhythm was atrial paced ventricular sensed. He has nearly 100% atrial pacing and does not have any underlying atrial rhythm when pacing is taken down to 30 bpm. There is only 1.1% ventricular pacing. His current generator longevity is estimated at 10 years. He has not had any episodes of atrial fibrillation since his last device check. As before, he has had infrequent episodes of nonsustained ventricular tachycardia. In July he had an 8 second event that consisted of 17 consecutive beats at a rate of almost 200 bpm. As before the spells have been asymptomatic.  He has a longstanding history of cardiac problems. He had CABG in 1990 (sequential LIMA to LAD and diagonal), multiple RCA PCI 1990-2004 and a redo CABG in 2004 (SVG-RCA-PDA, with surgical MAZE procedure, Dr. Amador Cunas). Cath 2007 and 2012 show occluded LAD and RCA with patent grafts, 60% ostial OM and diffuse stenoses distal LCX. 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 He had a generator changeout and ventricular lead revision in January 2014. He does  not tolerate VVI pacing. He has had occasional nonsustained VT recorded by the device, always asymptomatic. He has had paroxysmal atrial fibrillation. No history of stroke or TIA. Warfarin was stopped for recurrent GI bleeding requiring transfusions. Despite stopping warfarin anticoagulation, he had another episode of GI bleeding requiring transfusion in April 2015. He was seen at [facility] and underwent repeat endoscopy (endoscopic workup in February was negative). He tells me they found "2 small holes in his stomach" which were cauterized. He has not had any bleeding events since. He remains off anti-coagulation.  He wears CPAP for OSA, has insulin requiring type II DM, mild dyslipidemia intolerant to all statins, post-polio syndrome, lumbar spine surgery 01/2012.  He does not trust nuclear stress tests - reports they were repeatedly wrong in the past. [name] told him he should always have a cath rather than a stress test.   Past Medical History:  Diagnosis Date  . A-fib ([place])   . Anticoagulant long-term use, on coumadin for PAF 03/08/2012  . Chronic back pain   . Complete heart block ([place])   . Coronary artery disease    seen at [facility] cardiology   . Diabetes mellitus   . Hx of echocardiogram 06/29/2008   EF 45-50%   . Hypertension    Dr. Veronia Beets, primary  . Neuromuscular disorder ([place])    carpal tunnel bilaterally  . 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 Polio    hx of  . Sleep apnea    cpap, 11, last sleep study  [id number], sees Dr. Keturah Barre  . Status post placement of cardiac pacemaker, 03/07/12 New Medtronic Gen and new Vent lead original placed 2008 03/08/2012    Past Surgical History:  Procedure Laterality Date  . APPENDECTOMY    . BACK SURGERY    . CARDIAC CATHETERIZATION    . CORONARY ARTERY BYPASS GRAFT  1990  . CORONARY ARTERY BYPASS GRAFT  2004  . ESOPHAGOGASTRODUODENOSCOPY Left 03/18/2013   Procedure: ESOPHAGOGASTRODUODENOSCOPY (EGD);  Surgeon: Cleotis Nipper, MD;  Location: [facility] ENDOSCOPY;  Service: Endoscopy;  Laterality: Left;  . FRACTURE SURGERY     Hx: of right leg  . HERNIA REPAIR    . INSERT / REPLACE / REMOVE PACEMAKER  03/07/2012   replaced  . JOINT REPLACEMENT     Hx: of left knee  . LEFT HEART CATHETERIZATION WITH CORONARY/GRAFT ANGIOGRAM N/A 12/21/2010   Procedure: LEFT HEART CATHETERIZATION WITH Beatrix Fetters;  Surgeon: Leonie Man, MD;  Location: [facility] CATH LAB;  Service: Cardiovascular;  Laterality: N/A;  . LUMBAR LAMINECTOMY/DECOMPRESSION MICRODISCECTOMY  01/10/2012   Procedure: LUMBAR LAMINECTOMY/DECOMPRESSION MICRODISCECTOMY 1 LEVEL;  Surgeon: [name], MD;  Location: [place] NEURO ORS;  Service: Neurosurgery;  Laterality: Bilateral;  Lumbar three-four decompression, Posterior lateral fusion lumbar three-four, posterior spinus plate lumbar three-four  . MAZE Procedure  2004  . NASAL SEPTUM SURGERY N/A 14  . PACEMAKER INSERTION  08, 1/14  . PACEMAKER REVISION N/A 03/07/2012   Procedure: PACEMAKER REVISION;  Surgeon: Sanda Klein, MD;  Location: [place] CATH LAB;  Service: Cardiovascular;  Laterality: N/A;  . POSTERIOR FUSION LUMBAR SPINE  08/28/2012   Dr Ronnald Ramp  . SHOULDER ARTHROSCOPY WITH OPEN ROTATOR CUFF REPAIR Right 03/30/2014   Procedure: RIGHT SHOULDER ARTHROSCOPY  OPEN ROTATOR CUFF REPAIR;  Surgeon: Kerin Salen, MD;  Location: [place];  Service: Orthopedics;  Laterality: Right;  . SHOULDER OPEN ROTATOR CUFF REPAIR Right 03/30/2014   Procedure: ROTATOR CUFF REPAIR SHOULDER OPEN;  Surgeon: Kerin Salen, MD;  Location: [place];  Service: Orthopedics;  Laterality: Right;  . STENTS    . TONSILLECTOMY       Current Outpatient Prescriptions  Medication Sig Dispense Refill  . carvedilol (COREG) 25 MG tablet TAKE TWO TABLETS BY MOUTH TWICE DAILY 360 tablet 2  . Cyanocobalamin (VITAMIN B-12 PO) Take 1 tablet by mouth daily.    [name] Kitchen glipiZIDE (GLUCOTROL) 10 MG tablet Take 10 mg by mouth daily as needed (for high blood  surgars). For high blood sugars    . lisinopril-hydrochlorothiazide (PRINZIDE,ZESTORETIC) 20-25 MG tablet TAKE ONE TABLET BY MOUTH ONCE DAILY 90 tablet 3  . metFORMIN (GLUCOPHAGE) 500 MG tablet Take 1 tablet by mouth 4 (four) times daily.     No current facility-administered medications for this visit.     Allergies:   Review of patient's allergies indicates no known allergies.    Social History:  The patient  reports that he has never smoked. He has never used smokeless tobacco. He reports that he does not drink alcohol or use drugs.   Family History:  The patient's family history includes Cancer in his mother; Other in his father.    ROS:  Please see the history of present illness.    Otherwise, review of systems positive for shoulder discomfort postop .   All other systems are reviewed and negative.    PHYSICAL EXAM: VS:  BP 130/62   Pulse 66   Ht 5\' 10"  (1.778 m)   Wt 98.4 kg (217 lb)  BMI 31.14 kg/m  , BMI Body mass index is 31.14 kg/m.   General: Alert, oriented x3, no distress Head: no evidence of trauma, PERRL, EOMI, no exophtalmos or lid lag, no myxedema, no xanthelasma; normal ears, nose and oropharynx Neck: normal jugular venous pulsations and no hepatojugular reflux; brisk carotid pulses without delay and no carotid bruits Chest: clear to auscultation, no signs of consolidation by percussion or palpation, normal fremitus, symmetrical and full respiratory excursions; healthy left subclavian pacemaker site  Cardiovascular: normal position and quality of the apical impulse, regular rhythm, normal first and second heart sounds, no murmurs, rubs or gallops Abdomen: no tenderness or distention, no masses by palpation, no abnormal pulsatility or arterial bruits, normal bowel sounds, no hepatosplenomegaly Extremities: no clubbing, cyanosis or edema; 2+ radial, ulnar and brachial pulses bilaterally; 2+ right femoral, posterior tibial and dorsalis pedis pulses; 2+ left femoral,  posterior tibial and dorsalis pedis pulses; no subclavian or femoral bruits Neurological: grossly nonfocal   EKG:  EKG is ordered today. The ekg ordered today demonstrates atrial paced, ventricular sensed, chronic right bundle branch block, QRS 154 ms, QTC 474 ms  Recent Labs: 09/27/2015: TSH 1.520  Hemoglobin A1c 6.1% Creatinine 1.28, potassium 5.0, normal liver function tests  Lipid Panel 11/08/2015, Dr. Melford Aase Total cholesterol 186, triglycerides 168, HDL 44, LDL 108    Wt Readings from Last 3 Encounters:  11/11/15 98.4 kg (217 lb)  09/27/15 97.2 kg (214 lb 4 oz)  11/24/14 103.2 kg (227 lb 8 oz)     ASSESSMENT AND PLAN:  1. SSS (sick sinus syndrome) (HCC)   2. Paroxysmal atrial fibrillation (HCC)   3. CHB (complete heart block) (HCC)   4. Non-sustained ventricular tachycardia (HCC)   5. Status post placement of cardiac pacemaker   6. CORONARY ARTERY BYPASS GRAFT, HX OF, in 1990 and Re-do CABG 2004 with Atrial Maze procedure,    7. Essential hypertension   8. Diabetes mellitus type 2 in obese ([place])      1. SSS: He has no atrial activity and has complete sinus arrest. He is "atrial dependent" but has mostly normal AV conduction. Heart rate histogram distribution is favorable, no changes made to sensor settings are other pacemaker parameters today. 2. AFib: No episodes of atrial fibrillation for a long time. He has never had an embolic event. CHADSVasc score 4 (Age, vascular disease, diabetes mellitus) confers high risk for embolism, but he has also had severe and repeated episodes of upper GI bleeding even when anticoagulation had been stopped. No episodes of serious bleeding in a couple of years now. Continue to use his device to monitor for recurrent arrhythmia and reconsider anticoagulation in the future. 3. CHB: Paroxysmal heart block was the reason for initial pacemaker implantation, but he has at least 99% normal AV conduction. 4. NSVT: His pacemaker has occasionally  recorded relatively lengthy episodes of nonsustained VT, always asymptomatic. Previous workup for these events has not demonstrated active coronary insufficiency or other structural abnormalities. Continue beta blockers. 5. PPM: Normal device function. He has failed to perform routine downloads. We'll try again to do a remote download in 3 months but will make sure he has a follow up visit in 6 months as a backup. 6. CAD s/p CABG: Asymptomatic, no angina. No evidence of ischemia by most recent functional study. Normal left ventricle systolic function by most recent echo 7. HTN: Well-controlled 8. DM: Good hemoglobin A1c, optimal control 9. HLP: His LDL cholesterol is only slightly above target range. He  has tried all available statins in the past and has not tolerated any of them. He has lost 5 pounds in the last year Relatively mild LDL cholesterol elevation. The cost of PCS K9 inhibitors seems prohibitive to him. Improved efforts at weight loss and improved diet discussed.  Current medicines are reviewed at length with the patient today.  The patient does not have concerns regarding medicines.  Labs/ tests ordered today include:   Orders Placed This Encounter  Procedures  . EKG 12-Lead    Patient Instructions  Dr Sallyanne Kuster recommends that you continue on your current medications as directed. Please refer to the Current Medication list given to you today.  Remote monitoring is used to monitor your Pacemaker of ICD from home. This monitoring reduces the number of office visits required to check your device to one time per year. It allows [place] to keep an eye on the functioning of your device to ensure it is working properly. You are scheduled for a device check from home on Thursday, January 4th, 2018. You may send your transmission at any time that day. If you have a wireless device, the transmission will be sent automatically. After your physician reviews your transmission, you will receive a  postcard with your next transmission date.  Dr Sallyanne Kuster recommends that you schedule a follow-up appointment in 6 months with a pacemaker check. You will receive a reminder letter in the mail two months in advance. If you don't receive a letter, please call our office to schedule the follow-up appointment.  If you need a refill on your cardiac medications before your next appointment, please call your pharmacy.     Signed, Sanda Klein, MD  11/11/2015 2:59 PM    Sanda Klein, MD, [facility] [phone] office [phone] pager

## 2015-11-16 ENCOUNTER — Encounter: Payer: Self-pay | Admitting: Neurology

## 2015-11-16 ENCOUNTER — Ambulatory Visit (INDEPENDENT_AMBULATORY_CARE_PROVIDER_SITE_OTHER): Payer: PPO | Admitting: Neurology

## 2015-11-16 VITALS — BP 114/65 | HR 60 | Ht 70.0 in | Wt 211.0 lb

## 2015-11-16 DIAGNOSIS — R413 Other amnesia: Secondary | ICD-10-CM | POA: Diagnosis not present

## 2015-11-16 DIAGNOSIS — F05 Delirium due to known physiological condition: Secondary | ICD-10-CM | POA: Diagnosis not present

## 2015-11-16 DIAGNOSIS — I255 Ischemic cardiomyopathy: Secondary | ICD-10-CM | POA: Diagnosis not present

## 2015-11-16 NOTE — Progress Notes (Signed)
PATIENT: Alan [name] DOB: 1940-06-24  Chief Complaint  Patient presents with  . Mild Confusion/Slurred speech/Dizziness    MMSE 30/30 - 10 animals.  He is here with his signficant other, Alan [name].  He would like to review his EEG results.  He denies any futher episodes of slurred speech or dizziness. He feels his memory is fine.     HISTORICAL  Alan [name] is a 75 years old right-handed male, retired Insurance underwriter, accompanied by his friend Alan [name], seen in refer by  [name] for evaluation of abnormal CAT scan.  I reviewed and summarized the referring note, he had a history of polio, atrial fibrillation, chronic kidney disease, history of upper GI bleeding, history of complete atrial ventricular block, status post pacemaker, hyperlipidemia, hypertension, history of coronary artery disease, aortal coronary bypass surgery, obstructive sleep apnea, uses CPAP machine, diabetes since 2010.   He woke up at the end of July 2017, was noted to have mild confusion, word finding difficulties, also agitated, episode last for a few minutes, there was no associated new lateralized motor or sensory deficit.  He was taken to the emergency room, referred for CAT scan of the brain, I have personally reviewed CAT scan of the brain August 2017 from [facility], there was questionable subtle right parietal signal changes, moderate generalized atrophy, periventricular white matter disease, not a candidate for MRI due to pacemaker.  I was able to review his echocardiogram in May 2015, ejection fraction 55-60%, mild dilated left ventricular cavity size.   I have reviewed and summarized his EDG report in May 2015 from [facility], he presented with acute blood loss anemia, hemoglobin dropped to 7.7, gastrointestinal bleeding, he was taking Coumadin at that time for atrial fibrillation, EGD revealed AVM, that was cauterized, he was taken off Coumadin, was put on aspirin,  He was reevaluated in January 2017 for  complains of dizziness, he was found to have blood in stool, with orthostatic blood pressure changes, he was given iron supplement, fluid intake, Hg was 9.8. His aspirin was stopped since. Most recent laboratory evaluation in June 2017 showed normal hemoglobin of 13   He reported a history of childhood polio with right leg atrophy, gait abnormality, few years ago, he gone through extensive right ankle reconstruction and fusion surgery, his gait has improved.  He also noticed significant short-term memory loss   UPDATE Oct 10th 2017: I reviewed the echocardiogram report on September 28th 2017, ejection fraction 55-60%, wall motion was normal, he has no recurrent episode of transient confusion,  He has been taking aspirin 81 mg daily, no significant side effect noticed, no black stool,   Laboratory evaluation showed mildly low vitamin D level 25.6, normal or negative B12, TSH, RPR  REVIEW OF SYSTEMS: Full 14 system review of systems performed and notable only for back pain, neck stiffness, ringing in ears  ALLERGIES: No Known Allergies  HOME MEDICATIONS: Current Outpatient Prescriptions  Medication Sig Dispense Refill  . carvedilol (COREG) 25 MG tablet TAKE TWO TABLETS BY MOUTH TWICE DAILY 360 tablet 2  . Cyanocobalamin (VITAMIN B-12 PO) Take 1 tablet by mouth daily.    [name] Kitchen glipiZIDE (GLUCOTROL) 10 MG tablet Take 10 mg by mouth daily as needed (for high blood surgars). For high blood sugars    . lisinopril-hydrochlorothiazide (PRINZIDE,ZESTORETIC) 20-25 MG tablet TAKE ONE TABLET BY MOUTH ONCE DAILY 90 tablet 3  . metFORMIN (GLUCOPHAGE) 500 MG tablet Take 1 tablet by mouth 4 (four) times daily.  No current facility-administered medications for this visit.     PAST MEDICAL HISTORY: Past Medical History:  Diagnosis Date  . A-fib ([place])   . Anticoagulant long-term use, on coumadin for PAF 03/08/2012  . Chronic back pain   . Complete heart block ([place])   . Coronary artery disease    seen  at [facility] cardiology   . Diabetes mellitus   . Hx of echocardiogram 06/29/2008   EF 45-50%   . Hypertension    Dr. Veronia Beets, primary  . Neuromuscular disorder ([place])    carpal tunnel bilaterally  . 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 Polio    hx of  . Sleep apnea    cpap, 11, last sleep study Jan2013, sees Dr. Keturah Barre  . Status post placement of cardiac pacemaker, 03/07/12 New Medtronic Gen and new Vent lead original placed 2008 03/08/2012    PAST SURGICAL HISTORY: Past Surgical History:  Procedure Laterality Date  . APPENDECTOMY    . BACK SURGERY    . CARDIAC CATHETERIZATION    . CORONARY ARTERY BYPASS GRAFT  1990  . CORONARY ARTERY BYPASS GRAFT  2004  . ESOPHAGOGASTRODUODENOSCOPY Left 03/18/2013   Procedure: ESOPHAGOGASTRODUODENOSCOPY (EGD);  Surgeon: Cleotis Nipper, MD;  Location: [facility] ENDOSCOPY;  Service: Endoscopy;  Laterality: Left;  . FRACTURE SURGERY     Hx: of right leg  . HERNIA REPAIR    . INSERT / REPLACE / REMOVE PACEMAKER  03/07/2012   replaced  . JOINT REPLACEMENT     Hx: of left knee  . LEFT HEART CATHETERIZATION WITH CORONARY/GRAFT ANGIOGRAM N/A 12/21/2010   Procedure: LEFT HEART CATHETERIZATION WITH Beatrix Fetters;  Surgeon: Leonie Man, MD;  Location: [name] CATH LAB;  Service: Cardiovascular;  Laterality: N/A;  . LUMBAR LAMINECTOMY/DECOMPRESSION MICRODISCECTOMY  01/10/2012   Procedure: LUMBAR LAMINECTOMY/DECOMPRESSION MICRODISCECTOMY 1 LEVEL;  Surgeon: [name], MD;  Location: [place] NEURO ORS;  Service: Neurosurgery;  Laterality: Bilateral;  Lumbar three-four decompression, Posterior lateral fusion lumbar three-four, posterior spinus plate lumbar three-four  . MAZE Procedure  2004  . NASAL SEPTUM SURGERY N/A 14  . PACEMAKER INSERTION  08, 1/14  . PACEMAKER REVISION N/A 03/07/2012   Procedure: PACEMAKER REVISION;  Surgeon: Sanda Klein, MD;  Location: [place] CATH LAB;  Service: Cardiovascular;  Laterality: N/A;  . POSTERIOR FUSION LUMBAR  SPINE  08/28/2012   Dr Ronnald Ramp  . SHOULDER ARTHROSCOPY WITH OPEN ROTATOR CUFF REPAIR Right 03/30/2014   Procedure: RIGHT SHOULDER ARTHROSCOPY  OPEN ROTATOR CUFF REPAIR;  Surgeon: Kerin Salen, MD;  Location: [place];  Service: Orthopedics;  Laterality: Right;  . SHOULDER OPEN ROTATOR CUFF REPAIR Right 03/30/2014   Procedure: ROTATOR CUFF REPAIR SHOULDER OPEN;  Surgeon: Kerin Salen, MD;  Location: [place];  Service: Orthopedics;  Laterality: Right;  . STENTS    . TONSILLECTOMY      FAMILY HISTORY: Family History  Problem Relation Age of Onset  . Other Father     MVA  . Cancer Mother     SOCIAL HISTORY:  Social History   Social History  . Marital status: Single    Spouse name: N/A  . Number of children: 1  . Years of education: 64   Occupational History  . pilot-retired    Social History Main Topics  . Smoking status: Never Smoker  . Smokeless tobacco: Never Used  . Alcohol use No  . Drug use: No  . Sexual activity: Yes   Other Topics Concern  . Not  on file   Social History Narrative   Lives on [address] [facility], fishes.    Right-handed.   1 cup caffeine per day.     PHYSICAL EXAM   Vitals:   11/16/15 0839  BP: 114/65  Pulse: 60  Weight: 211 lb (95.7 kg)  Height: 5\' 10"  (1.778 m)    Not recorded      Body mass index is 30.28 kg/m.  PHYSICAL EXAMNIATION:  Gen: NAD, conversant, well nourised, obese, well groomed                     Cardiovascular: Regular rate rhythm, no peripheral edema, warm, nontender. Eyes: Conjunctivae clear without exudates or hemorrhage Neck: Supple, no carotid bruise. Pulmonary: Clear to auscultation bilaterally   NEUROLOGICAL EXAM:  MENTAL STATUS: Speech:    Speech is normal; fluent and spontaneous with normal comprehension.  Cognition:Mini-Mental Status Examination was 29/30, animal naming 18     Orientation to time, place and person     Recent and remote memory: He missed 1/3 recall     Normal Attention span  and concentration     Normal Language, naming, repeating,spontaneous speech     Fund of knowledge   CRANIAL NERVES: CN II: Visual fields are full to confrontation. Fundoscopic exam is normal with sharp discs and no vascular changes. Pupils are round equal and briskly reactive to light. CN III, IV, VI: extraocular movement are normal. No ptosis. CN V: Facial sensation is intact to pinprick in all 3 divisions bilaterally. Corneal responses are intact.  CN VII: Face is symmetric with normal eye closure and smile. CN VIII: Hearing is normal to rubbing fingers CN IX, X: Palate elevates symmetrically. Phonation is normal. CN XI: Head turning and shoulder shrug are intact CN XII: Tongue is midline with normal movements and no atrophy.  MOTOR: He has significant right lower extremity atrophy, fixed right ankle, there was no significant right lower extremity proximal and distal weakness.  REFLEXES: Reflexes are 2+ and symmetric at the biceps, triceps, knees, and ankles. Plantar responses are flexor.  SENSORY: Intact to light touch, pinprick, positional sensation and vibratory sensation are intact in fingers and toes.  COORDINATION: Rapid alternating movements and fine finger movements are intact. There is no dysmetria on finger-to-nose and heel-knee-shin.    GAIT/STANCE: Fixed right ankle, mildly unsteady, dragging right leg  DIAGNOSTIC DATA (LABS, IMAGING, TESTING) - I reviewed patient records, labs, notes, testing and imaging myself where available.   ASSESSMENT AND PLAN  Alan [name] is a 75 y.o. male   Transient language difficulty  Differentiation diagnosis includes TIAs involving left frontal region, medicine side effect, worsening confusion from his baseline  He has multiple vascular risk factors, including age, hypertension, hyperlipidemia, diabetes, coronary artery disease, obstructive sleep apnea, atrial fibrillation   keep aspirin 81 mg daily   he had a history of  significant GI bleeding due to AVM in 2015, chronic anticoagulation coumadin was stopped following his GI bleeding, AVM malformation require cauterization in 2015, he was put on aspirin,    in January 2017 he had another episode of GI bleeding, asa was stopped since I have asked him to observing his stool color carefully, if he noticed black stool, he should call his GI or  primary care doctor  Worsening memory loss  Minimental status examination 30/30 today,   Labatory evaluation showed normal TSH, B12, negative RPR, mildly low vitamin D level 25.6,   Marcial Pacas, M.D.  Ph.D.  [facility] [address], [place], [place] [address] Ph: [phone] Fax: [phone]  CC: [name], MD

## 2015-11-29 ENCOUNTER — Telehealth: Payer: Self-pay | Admitting: Neurology

## 2015-11-29 NOTE — Telephone Encounter (Signed)
Patient is ready to be scheduled for Doppler. No PA needed. Thanks [employer]

## 2015-11-30 ENCOUNTER — Encounter: Payer: PPO | Admitting: Cardiovascular Disease

## 2015-12-08 ENCOUNTER — Telehealth: Payer: Self-pay | Admitting: Neurology

## 2015-12-08 NOTE — Telephone Encounter (Signed)
[facility] [employer] No PA needed. For Doppler [id number]. Patient ready to be scheduled. Thanks Hinton Dyer.

## 2015-12-09 ENCOUNTER — Ambulatory Visit (INDEPENDENT_AMBULATORY_CARE_PROVIDER_SITE_OTHER): Payer: PPO

## 2015-12-09 DIAGNOSIS — R413 Other amnesia: Secondary | ICD-10-CM

## 2015-12-09 DIAGNOSIS — F05 Delirium due to known physiological condition: Secondary | ICD-10-CM

## 2015-12-09 DIAGNOSIS — I255 Ischemic cardiomyopathy: Secondary | ICD-10-CM

## 2015-12-14 ENCOUNTER — Telehealth: Payer: Self-pay | Admitting: *Deleted

## 2015-12-14 NOTE — Telephone Encounter (Signed)
Carotid Duplex Study (completed on 12/09/15):  Data Impression: Acoustic shadow plaques and some mild intimal thickening were observed in the bulbs bilaterally.  Physician Impression: This study is negative for hemodynamically significant stenosis involving extracranial carotid and vertebral arteries bilaterally.

## 2015-12-29 DIAGNOSIS — J069 Acute upper respiratory infection, unspecified: Secondary | ICD-10-CM | POA: Diagnosis not present

## 2015-12-29 DIAGNOSIS — E1165 Type 2 diabetes mellitus with hyperglycemia: Secondary | ICD-10-CM | POA: Diagnosis not present

## 2015-12-29 DIAGNOSIS — E118 Type 2 diabetes mellitus with unspecified complications: Secondary | ICD-10-CM | POA: Diagnosis not present

## 2016-01-07 ENCOUNTER — Other Ambulatory Visit: Payer: Self-pay | Admitting: Cardiovascular Disease

## 2016-01-20 DIAGNOSIS — L570 Actinic keratosis: Secondary | ICD-10-CM | POA: Diagnosis not present

## 2016-01-20 DIAGNOSIS — L304 Erythema intertrigo: Secondary | ICD-10-CM | POA: Diagnosis not present

## 2016-01-24 DIAGNOSIS — J329 Chronic sinusitis, unspecified: Secondary | ICD-10-CM | POA: Diagnosis not present

## 2016-02-09 DIAGNOSIS — E1165 Type 2 diabetes mellitus with hyperglycemia: Secondary | ICD-10-CM | POA: Diagnosis not present

## 2016-02-09 DIAGNOSIS — I10 Essential (primary) hypertension: Secondary | ICD-10-CM | POA: Diagnosis not present

## 2016-02-09 DIAGNOSIS — G4733 Obstructive sleep apnea (adult) (pediatric): Secondary | ICD-10-CM | POA: Diagnosis not present

## 2016-02-09 DIAGNOSIS — E785 Hyperlipidemia, unspecified: Secondary | ICD-10-CM | POA: Diagnosis not present

## 2016-02-09 DIAGNOSIS — E118 Type 2 diabetes mellitus with unspecified complications: Secondary | ICD-10-CM | POA: Diagnosis not present

## 2016-02-09 DIAGNOSIS — Z9989 Dependence on other enabling machines and devices: Secondary | ICD-10-CM | POA: Diagnosis not present

## 2016-02-09 DIAGNOSIS — E1169 Type 2 diabetes mellitus with other specified complication: Secondary | ICD-10-CM | POA: Diagnosis not present

## 2016-02-09 DIAGNOSIS — R1031 Right lower quadrant pain: Secondary | ICD-10-CM | POA: Diagnosis not present

## 2016-02-10 ENCOUNTER — Telehealth: Payer: Self-pay | Admitting: Cardiology

## 2016-02-10 ENCOUNTER — Encounter: Payer: PPO | Admitting: *Deleted

## 2016-02-10 NOTE — Telephone Encounter (Signed)
LMOVM reminding pt to send remote transmission.   

## 2016-02-17 DIAGNOSIS — R1031 Right lower quadrant pain: Secondary | ICD-10-CM | POA: Diagnosis not present

## 2016-02-17 DIAGNOSIS — G4733 Obstructive sleep apnea (adult) (pediatric): Secondary | ICD-10-CM | POA: Diagnosis not present

## 2016-02-17 DIAGNOSIS — E118 Type 2 diabetes mellitus with unspecified complications: Secondary | ICD-10-CM | POA: Diagnosis not present

## 2016-02-17 DIAGNOSIS — I480 Paroxysmal atrial fibrillation: Secondary | ICD-10-CM | POA: Diagnosis not present

## 2016-02-17 DIAGNOSIS — E1165 Type 2 diabetes mellitus with hyperglycemia: Secondary | ICD-10-CM | POA: Diagnosis not present

## 2016-03-02 ENCOUNTER — Ambulatory Visit (INDEPENDENT_AMBULATORY_CARE_PROVIDER_SITE_OTHER): Payer: PPO | Admitting: Neurology

## 2016-03-02 ENCOUNTER — Encounter: Payer: Self-pay | Admitting: Neurology

## 2016-03-02 VITALS — BP 142/60 | HR 80 | Resp 16 | Ht 70.0 in | Wt 214.0 lb

## 2016-03-02 DIAGNOSIS — G14 Postpolio syndrome: Secondary | ICD-10-CM | POA: Diagnosis not present

## 2016-03-02 DIAGNOSIS — E669 Obesity, unspecified: Secondary | ICD-10-CM

## 2016-03-02 DIAGNOSIS — G4733 Obstructive sleep apnea (adult) (pediatric): Secondary | ICD-10-CM

## 2016-03-02 DIAGNOSIS — Z9989 Dependence on other enabling machines and devices: Secondary | ICD-10-CM | POA: Diagnosis not present

## 2016-03-02 NOTE — Patient Instructions (Signed)
Please continue using your CPAP regularly. While your insurance requires that you use CPAP at least 4 hours each night on 70% of the nights, I recommend, that you not skip any nights and use it throughout the night if you can. Getting used to CPAP and staying with the treatment long term does take time and patience and discipline. Untreated obstructive sleep apnea when it is moderate to severe can have an adverse impact on cardiovascular health and raise her risk for heart disease, arrhythmias, hypertension, congestive heart failure, stroke and diabetes. Untreated obstructive sleep apnea causes sleep disruption, nonrestorative sleep, and sleep deprivation. This can have an impact on your day to day functioning and cause daytime sleepiness and impairment of cognitive function, memory loss, mood disturbance, and problems focussing. Using CPAP regularly can improve these symptoms.  Keep up the good work! I will see you back in 12 months for sleep apnea check up.   

## 2016-03-02 NOTE — Progress Notes (Signed)
Subjective:    Patient ID: Alan [name] is a 76 y.o. male.  HPI     Star Age, MD, PhD [facility] [address].O. [place], [place] [address]  Dear Dr. Melford Aase,  I saw your patient, Alan [name], upon your kind request in my neurologic clinic today for initial consultation of his sleep disorder, in particular evaluation for obstructive sleep apnea. The patient is unaccompanied today. As you know, Alan [name] is a 76 year old right-handed gentleman with a complex medical history of hypertension, coronary artery disease, complete heart block, chronic back pain, hyperlipidemia, history of upper GI bleed, A. fib, chronic kidney disease, status post pacemaker placement, postpolio, and obesity, who was previously diagnosed with obstructive sleep apnea and placed on CPAP therapy. Prior sleep study results are not available for my review today, a CPAP download was reviewed today from 02/01/2016 through 03/01/2016, which is a total of 30 days, during which time he used his machine every night with percent used days greater than 4 hours at 100%, indicating superb compliance, average usage of 7 hours and 40 minutes, residual AHI 0.7 per hour, leak on the high side with the 95th percentile at 70.2 L/m on a pressure of 12 cm with EPR of 3. He has an S9 Elite CPAP machine which is at least 75 years old. He is also status post UPPP for OSA. I reviewed your office note 02/09/2016. He reports compliance with his CPAP and uses nasal pillows. He needs new supplies. He saw my colleague, Dr. Krista Blue twice last year for confusion, dizziness, concern for TIA. I reviewed her note from 11/16/2015. The patient is retired. 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 He lives with his wife. He has one daughter and 2 grandchildren. He is a nonsmoker, drinks alcohol very occasionally and caffeine in the form of coffee, typically 2 cups in the morning. Bedtime  varies and generally between 9 PM and 10:30 PM. Wakeup time is between 5 and 7 AM. He has nocturia usually once per night and denies morning headaches. He denies any frank restless leg symptoms. He has been trying to lose weight. He has lost about 22 pounds since February 2016 when his weight was listed to be 236 pounds, current weight 214 pounds. He used to follow with Dr. Baird Lyons for his sleep apnea. He would like to be able to get a new or updated CPAP machine if possible.  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 He had ankle fusion on the right and surgery to the right leg. He still has decrease in range of motion in his right leg and foot. His Epworth sleepiness score is 13 out of 24 today, his fatigue score is 19 out of 63. He occasionally takes a nap.  His Past Medical History Is Significant For: Past Medical History:  Diagnosis Date  . A-fib ([place])   . Anticoagulant long-term use, on coumadin for PAF 03/08/2012  . Chronic back pain   . Complete heart block ([place])   . Coronary artery disease    seen at [facility] cardiology   . Diabetes mellitus   . Hx of echocardiogram 06/29/2008   EF 45-50%   . Hypertension    Dr. Veronia Beets, primary  . Neuromuscular disorder ([place])    carpal tunnel bilaterally  . Pacemaker  03/07/2012   replaced  . Polio    hx of  . Sleep apnea    cpap, 11, last sleep study Jan2013, sees Dr. Keturah Barre  . Status post placement of cardiac pacemaker, 03/07/12 New Medtronic Gen and new Vent lead original placed 2008 03/08/2012    His Past Surgical History Is Significant For: Past Surgical History:  Procedure Laterality Date  . APPENDECTOMY    . BACK SURGERY    . CARDIAC CATHETERIZATION    . CORONARY ARTERY BYPASS GRAFT  1990  . CORONARY ARTERY BYPASS GRAFT  2004  . ESOPHAGOGASTRODUODENOSCOPY Left 03/18/2013   Procedure: ESOPHAGOGASTRODUODENOSCOPY (EGD);  Surgeon:  Cleotis Nipper, MD;  Location[facility] ENDOSCOPY;  Service: Endoscopy;  Laterality: Left;  . FRACTURE SURGERY     Hx: of right leg  . HERNIA REPAIR    . INSERT / REPLACE / REMOVE PACEMAKER  03/07/2012   replaced  . JOINT REPLACEMENT     Hx: of left knee  . LEFT HEART CATHETERIZATION WITH CORONARY/GRAFT ANGIOGRAM N/A 12/21/2010   Procedure: LEFT HEART CATHETERIZATION WITH Beatrix Fetters;  Surgeon: Leonie Man, MD;  Location: [facility] CATH LAB;  Service: Cardiovascular;  Laterality: N/A;  . LUMBAR LAMINECTOMY/DECOMPRESSION MICRODISCECTOMY  01/10/2012   Procedure: LUMBAR LAMINECTOMY/DECOMPRESSION MICRODISCECTOMY 1 LEVEL;  Surgeon: [name], MD;  Location: [place] NEURO ORS;  Service: Neurosurgery;  Laterality: Bilateral;  Lumbar three-four decompression, Posterior lateral fusion lumbar three-four, posterior spinus plate lumbar three-four  . MAZE Procedure  2004  . NASAL SEPTUM SURGERY N/A 14  . PACEMAKER INSERTION  08, 1/14  . PACEMAKER REVISION N/A 03/07/2012   Procedure: PACEMAKER REVISION;  Surgeon: Sanda Klein, MD;  Location: [place] CATH LAB;  Service: Cardiovascular;  Laterality: N/A;  . POSTERIOR FUSION LUMBAR SPINE  08/28/2012   Dr Ronnald Ramp  . SHOULDER ARTHROSCOPY WITH OPEN ROTATOR CUFF REPAIR Right 03/30/2014   Procedure: RIGHT SHOULDER ARTHROSCOPY  OPEN ROTATOR CUFF REPAIR;  Surgeon: Kerin Salen, MD;  Location: [place];  Service: Orthopedics;  Laterality: Right;  . SHOULDER OPEN ROTATOR CUFF REPAIR Right 03/30/2014   Procedure: ROTATOR CUFF REPAIR SHOULDER OPEN;  Surgeon: Kerin Salen, MD;  Location: [place];  Service: Orthopedics;  Laterality: Right;  . STENTS    . TONSILLECTOMY      His Family History Is Significant For: Family History  Problem Relation Age of Onset  . Other Father     MVA  . Cancer Mother     His Social History Is Significant For: Social History   Social History  . Marital status: Single    Spouse name: N/A  . Number of children: 1  . Years of  education: 78   Occupational History  . pilot-retired    Social History Main Topics  . Smoking status: Never Smoker  . Smokeless tobacco: Never Used  . Alcohol use No  . Drug use: No  . Sexual activity: Yes   Other Topics Concern  . None   Social History Narrative   [place], [place].    Right-handed.   1 cup caffeine per day.    His Allergies Are:  No Known Allergies:   His Current Medications Are:  Outpatient Encounter Prescriptions as of 03/02/2016  Medication Sig  . aspirin EC 81 MG tablet Take 81 mg by mouth daily.  . carvedilol (COREG) 25 MG tablet TAKE TWO TABLETS BY MOUTH TWICE DAILY  . Cyanocobalamin (VITAMIN B-12 PO) Take 1 tablet by  mouth daily.  [name] Kitchen glipiZIDE (GLUCOTROL) 10 MG tablet Take 10 mg by mouth daily as needed (for high blood surgars). For high blood sugars  . lisinopril-hydrochlorothiazide (PRINZIDE,ZESTORETIC) 20-25 MG tablet TAKE ONE TABLET BY MOUTH ONCE DAILY  . metFORMIN (GLUCOPHAGE) 500 MG tablet Take 1 tablet by mouth 4 (four) times daily.   No facility-administered encounter medications on file as of 03/02/2016.   :  Review of Systems:  Out of a complete 14 point review of systems, all are reviewed and negative with the exception of these symptoms as listed below: Review of Systems  Neurological:       Patient states that his last sleep study was about 5-6 years ago. He was prescribed CPAP and still uses it. Uses [facility]. Needs new mask Patient states that he sleeps only 4-4.5 hours at a time at night, hx of snoring without CPAP, may fall asleep when sitting still.     Epworth Sleepiness Scale 0= would never doze 1= slight chance of dozing 2= moderate chance of dozing 3= high chance of dozing  Sitting and reading:2 Watching TV:2 Sitting inactive in a public place (ex. Theater or meeting):3 As a passenger in a car for an hour without a break: Lying down to rest in the afternoon:1 Sitting and talking to  someone:2 Sitting quietly after lunch (no alcohol):2 In a car, while stopped in traffic:0 Total:13  Objective:  Neurologic Exam  Physical Exam Physical Examination:   Vitals:   03/02/16 1122  BP: (!) 142/60  Pulse: 80  Resp: 16   General Examination: The patient is a very pleasant 76 y.o. male in no acute distress. He appears well-developed and well-nourished and well groomed. In good spirits.   HEENT: Normocephalic, atraumatic, pupils are equal, round and reactive to light and accommodation. Funduscopic exam is normal with sharp disc margins noted. Extraocular tracking is good without limitation to gaze excursion or nystagmus noted. Normal smooth pursuit is noted. Hearing is grossly intac. Face is symmetric with normal facial animation and normal facial sensation. Speech is clear with no dysarthria noted. There is no hypophonia. There is no lip, neck/head, jaw or voice tremor. Neck is supple with full range of passive and active motion. There are no carotid bruits on auscultation. Oropharynx exam reveals: mild mouth dryness, s/p UPPP, adequate dental hygiene.  Mallampati is class II. Tongue protrudes centrally and palate elevates symmetrically. Tonsils are absent. Neck size is 17 7/8 inches. He has a Mild overbite.    Chest: Clear to auscultation without wheezing, rhonchi or crackles noted.  Heart: S1+S2+0, regular and normal without murmurs, rubs or gallops noted.   Abdomen: Soft, non-tender and non-distended with normal bowel sounds appreciated on auscultation.  Extremities: There is trace pitting edema in the distal lower extremities bilaterally. Pedal pulses are intact.  Skin: Warm and dry without trophic changes noted.  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   Neurologically:  Mental status: The patient is awake, alert and oriented in all 4 spheres. His immediate and remote  memory, attention, language skills and fund of knowledge are appropriate. There is no evidence of aphasia, agnosia, apraxia or anomia. Speech is clear with normal prosody and enunciation. Thought process is linear. Mood is normal and affect is normal.  Cranial nerves II - XII are as described above under HEENT exam. In addition: shoulder shrug is normal with equal shoulder height noted. 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 There is no drift, tremor or rebound. Romberg is negative. Reflexes are 1+ throughout, absent reflexes in the right leg. Fine motor skills and coordination: intact with normal finger taps, normal hand movements, normal rapid alternating patting, normal foot taps and normal foot agility, except R ankle.   Cerebellar testing: No dysmetria or intention tremor on finger to nose testing. Heel to shin is unremarkable bilaterally. There is no truncal or gait ataxia.  Sensory exam: intact to light touch, pinprick, vibration, temperature sense in the upper and lower extremities.  Gait, station and balance: He stands easily. No veering to one side is noted. No leaning to one side is noted. Posture is age-appropriate and stance is narrow based. Gait shows normal stride length and normal pace. No problems turning are noted. Tandem walk is difficult for him.               Assessment and Plan:  In summary, ASTON LIESKE is a very pleasant 76 y.o.-year old male with a complex medical history of hypertension, coronary artery disease, complete heart block, chronic back pain, hyperlipidemia, history of upper GI bleed, A. fib, chronic kidney disease, status post pacemaker placement, postpolio, and obesity, who was previously diagnosed with obstructive sleep apnea and placed on CPAP therapy. He is fully compliant with CPAP therapy at a pressure of 12 cm, residual AHI low at 0.7 per hour, he is  commended for his treatment adherence. He may qualify for an updated CPAP machine and I will order one today. We will also update his supplies. Unfortunately, prior sleep study results are not available for my review today. His home health company may have records. I had a long chat with the patient about my findings and the diagnosis of OSA, its prognosis and treatment options. We talked about medical treatments, surgical interventions and non-pharmacological approaches. I explained in particular the risks and ramifications of untreated moderate to severe OSA, especially with respect to developing cardiovascular disease down the Road, including congestive heart failure, difficult to treat hypertension, cardiac arrhythmias, or stroke. Even type 2 diabetes has, in part, been linked to untreated OSA. Symptoms of untreated OSA include daytime sleepiness, memory problems, mood irritability and mood disorder such as depression and anxiety, lack of energy, as well as recurrent headaches, especially morning headaches. We talked about trying to maintain a healthy lifestyle in general, as well as the importance of weight control. I encouraged the patient to eat healthy, exercise daily and keep well hydrated, to keep a scheduled bedtime and wake time routine, to not skip any meals and eat healthy snacks in between meals. I advised the patient not to drive when feeling sleepy. I recommended the following at this time: Continue CPAP therapy at the current settings. We will try to update his machine and supplies.  I explained the importance of being compliant with PAP treatment, not only for insurance purposes but primarily to improve His symptoms, and for the patient's long term health benefit, including to reduce His cardiovascular risks. I answered all his questions today and the patient was in agreement. I would like to see him back routinely in one year for sleep apnea.  Thank you very much for allowing me to  participate in the care of this nice patient. If I can be of any further assistance to you please do not hesitate to  call me at [phone].  Sincerely,   Star Age, MD, PhD

## 2016-03-07 ENCOUNTER — Telehealth: Payer: Self-pay

## 2016-03-07 NOTE — Telephone Encounter (Signed)
I sent CPAP orders to [facility] and this is the email I received back:   Shelby Dubin, RN; Leeroy Bock        Alan [name],   Unfortunately we do not have pt's original diagnostic sleep study on file. I am not seeing it under procedures in his chart. Can you help me track that down as it's required for pt to get a replacement unit covered by insurance.   Thanks.  Angie

## 2016-03-07 NOTE — Telephone Encounter (Signed)
LM for patient to call back. I advised that [facility] does not have his sleep study. They will need that to process his order. I asked that he call back and let me know if he has a copy of the sleep study or know of any other doctor that would have this on file for him.

## 2016-03-08 NOTE — Telephone Encounter (Signed)
It appears that the patient's last sleep study was around July 1992. I have sent [place] a message to see if insurance will accept this study or if we need to get an up dated one.

## 2016-03-08 NOTE — Telephone Encounter (Signed)
Pt returned RN's call. He said the last provider he saw was Alan [name]/[facility] about 2 years ago. He said last sleep study was about 26yrs at [facility] and he is thinking Dr [name] ordered it. Please call

## 2016-03-09 DIAGNOSIS — M5136 Other intervertebral disc degeneration, lumbar region: Secondary | ICD-10-CM | POA: Diagnosis not present

## 2016-03-09 DIAGNOSIS — M160 Bilateral primary osteoarthritis of hip: Secondary | ICD-10-CM | POA: Diagnosis not present

## 2016-03-09 DIAGNOSIS — K5790 Diverticulosis of intestine, part unspecified, without perforation or abscess without bleeding: Secondary | ICD-10-CM | POA: Diagnosis not present

## 2016-03-09 DIAGNOSIS — K4090 Unilateral inguinal hernia, without obstruction or gangrene, not specified as recurrent: Secondary | ICD-10-CM | POA: Diagnosis not present

## 2016-03-09 DIAGNOSIS — Z981 Arthrodesis status: Secondary | ICD-10-CM | POA: Diagnosis not present

## 2016-03-09 DIAGNOSIS — N401 Enlarged prostate with lower urinary tract symptoms: Secondary | ICD-10-CM | POA: Diagnosis not present

## 2016-03-09 DIAGNOSIS — N281 Cyst of kidney, acquired: Secondary | ICD-10-CM | POA: Diagnosis not present

## 2016-03-09 NOTE — Telephone Encounter (Signed)
I spoke to Dr. Janee Morn office. Because it has been so long ago, they will need to order his chart. They have the request for sleep study and face to face notes before sleep study and will fax to [place].

## 2016-03-13 NOTE — Telephone Encounter (Signed)
FYI see below. Patient saw [place] for sleep consult. Last SS done 1992. We do not have those records. He is on CPAP and gets supplies from [facility], they do not have his study on record either. I have reached out to Dr. Janee Morn office, they have to "order his chart" since it was done so long ago. If they have it, they will fax to [place]. [facility] needs this before supplying any more supplies. If it is unavailable he will need to repeat sleep study.

## 2016-03-14 ENCOUNTER — Telehealth: Payer: Self-pay

## 2016-03-14 DIAGNOSIS — M545 Low back pain: Secondary | ICD-10-CM | POA: Diagnosis not present

## 2016-03-14 NOTE — Telephone Encounter (Signed)
SENT NOTE TO SCHEDULING

## 2016-03-14 NOTE — Telephone Encounter (Signed)
I received a copy of pt's sleep study performed on 08/26/1990. I will fax this to [facility] and also have it scanned into EP[employer]. I will reach out to [facility] to find out if they need anything further from [place] as well.

## 2016-03-15 ENCOUNTER — Telehealth: Payer: Self-pay | Admitting: Cardiovascular Disease

## 2016-03-15 NOTE — Telephone Encounter (Signed)
Received records from [facility] for appointment on 03/16/16 with Dr Sallyanne Kuster.  Records put with Dr Victorino December schedule on 03/16/16. lp

## 2016-03-16 ENCOUNTER — Ambulatory Visit (INDEPENDENT_AMBULATORY_CARE_PROVIDER_SITE_OTHER): Payer: PPO | Admitting: Cardiovascular Disease

## 2016-03-16 ENCOUNTER — Ambulatory Visit: Payer: PPO | Admitting: *Deleted

## 2016-03-16 ENCOUNTER — Encounter: Payer: Self-pay | Admitting: Cardiovascular Disease

## 2016-03-16 VITALS — BP 150/78 | HR 64 | Ht 70.0 in | Wt 221.8 lb

## 2016-03-16 DIAGNOSIS — I495 Sick sinus syndrome: Secondary | ICD-10-CM | POA: Diagnosis not present

## 2016-03-16 DIAGNOSIS — I480 Paroxysmal atrial fibrillation: Secondary | ICD-10-CM

## 2016-03-16 DIAGNOSIS — I25718 Atherosclerosis of autologous vein coronary artery bypass graft(s) with other forms of angina pectoris: Secondary | ICD-10-CM | POA: Diagnosis not present

## 2016-03-16 DIAGNOSIS — E669 Obesity, unspecified: Secondary | ICD-10-CM

## 2016-03-16 DIAGNOSIS — B91 Sequelae of poliomyelitis: Secondary | ICD-10-CM

## 2016-03-16 DIAGNOSIS — Z950 Presence of cardiac pacemaker: Secondary | ICD-10-CM | POA: Diagnosis not present

## 2016-03-16 DIAGNOSIS — I472 Ventricular tachycardia: Secondary | ICD-10-CM | POA: Diagnosis not present

## 2016-03-16 DIAGNOSIS — E1169 Type 2 diabetes mellitus with other specified complication: Secondary | ICD-10-CM

## 2016-03-16 DIAGNOSIS — I10 Essential (primary) hypertension: Secondary | ICD-10-CM | POA: Diagnosis not present

## 2016-03-16 DIAGNOSIS — I442 Atrioventricular block, complete: Secondary | ICD-10-CM

## 2016-03-16 DIAGNOSIS — K4091 Unilateral inguinal hernia, without obstruction or gangrene, recurrent: Secondary | ICD-10-CM | POA: Diagnosis not present

## 2016-03-16 DIAGNOSIS — E785 Hyperlipidemia, unspecified: Secondary | ICD-10-CM

## 2016-03-16 DIAGNOSIS — I4729 Other ventricular tachycardia: Secondary | ICD-10-CM

## 2016-03-16 MED ORDER — AMLODIPINE BESYLATE 5 MG PO TABS: 5.0000 mg | ORAL_TABLET | Freq: Every day | ORAL | 3 refills | Status: DC

## 2016-03-16 NOTE — Progress Notes (Signed)
duplicate

## 2016-03-16 NOTE — Patient Instructions (Signed)
Dr Sallyanne Kuster has recommended making the following medication changes: 1. START Amlodipine 5 mg - take 1 tablet by mouth daily  Your physician has requested that you regularly monitor and record your blood pressure readings at home. Please use the same machine at the same time of day to check your readings and record them to bring to your follow-up visit.  Dr Sallyanne Kuster recommends that you schedule a follow-up appointment in 3 months.  If you need a refill on your cardiac medications before your next appointment, please call your pharmacy.

## 2016-03-16 NOTE — Progress Notes (Signed)
Cardiology Office Note    Date:  03/16/2016   ID:  EDGAR REISZ, DOB 1940-03-22, MRN [id number]  PCP:  [name], MD  Cardiologist:  Sanda Klein, MD   Chief Complaint  Patient presents with  . Follow-up    History of Present Illness:  JACORIE ERNSBERGER is a 76 y.o. male who presents with new complaints of exertional chest discomfort   Over the last few weeks which has noticed chest tightness radiating from left towards the right whenever he does any yard work. If he stops to rest the symptoms resolve within a matter of about 5 minutes. The symptoms are not exactly similar to the angina that he experienced during balloon angioplasty several years ago. That felt more like somebody "walking on his chest". They are however predictably brought on by physical activity. She has also noticed that his blood pressure is episodically elevated. The lowest systolic blood pressure has recorded has been 134. His blood pressure sometimes is 160s-170s, especially in the afternoons. He reports compliance with all his medications including twice daily carvedilol.  Glycemic control remains very good on metformin monotherapy (A1c 6.2%).  He denies dyspnea at rest or with exertion, orthopnea, paroxysmal nocturnal dyspnea, syncope, palpitations, focal neurological deficits, intermittent claudication, lower extremity edema, unexplained weight gain, cough, hemoptysis or wheezing. Continues to have problems with back pain. Has had some discomfort in his right groin at the site of previous inguinal hernia repair. He has seen a Psychologist, sport and exercise (Dr. Tally Due in [place]). A CT of the abdomen showed some recurrence of his left inguinal hernia (fat only, no intestinal contents, but actually does not show any problems at the site of the right hernia repair  Interrogation of his pacemaker shows normal device function. The presenting rhythm was atrial paced ventricular sensed. He has nearly 99.4% atrial pacing  and does not have any underlying atrial rhythm when pacing is taken down to 30 bpm. There is only 0.5% ventricular pacing. His current generator longevity is estimated at 10 years. He has not had any episodes of atrial fibrillation since his last device check. As before, he has had infrequent episodes of nonsustained ventricular tachycardia. Since his last device check the longest episode of nonsustained VT consists of 13 beats at approximately 183 bpm. As before, the spells of VT occur roughly once or twice a month and are consistently asymptomatic  He has a longstanding history of cardiac problems. He had CABG in 1990 (sequential LIMA to LAD and diagonal), multiple RCA PCI 1990-2004 and a redo CABG in 2004 (SVG-RCA-PDA, with surgical MAZE procedure, Dr. Amador Cunas). Cath 2007 and 2012 show occluded LAD and RCA with patent grafts, 60% ostial OM and diffuse stenoses distal LCX. 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 He had a generator changeout and ventricular lead revision in January 2014. He does not tolerate VVI pacing. He has had occasional nonsustained VT recorded by the device, always asymptomatic. He has had paroxysmal atrial fibrillation. No history of stroke or TIA. Warfarin was stopped for recurrent GI bleeding requiring transfusions. Despite stopping warfarin anticoagulation, he had another episode of GI bleeding requiring transfusion in April 2015. He was seen at [facility] and underwent repeat endoscopy (endoscopic workup in February was negative). He tells me they found "2 small holes in his stomach" which were cauterized.  He has not had any bleeding events since. He remains off anti-coagulation.  He wears CPAP for OSA, has insulin requiring type II DM, mild dyslipidemia intolerant to all statins, post-polio syndrome, lumbar spine  surgery 01/2012.  He does not trust nuclear stress tests - reports they were repeatedly wrong in the past. [name] told him he should always have a cath rather than a stress test.      Past Medical History:  Diagnosis Date  . A-fib ([place])   . Anticoagulant long-term use, on coumadin for PAF 03/08/2012  . Chronic back pain   . Complete heart block ([place])   . Coronary artery disease    seen at [facility] cardiology   . Diabetes mellitus   . Hx of echocardiogram 06/29/2008   EF 45-50%   . Hypertension    Dr. Veronia Beets, primary  . Neuromuscular disorder ([place])    carpal tunnel bilaterally  . 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 Polio    hx of  . Sleep apnea    cpap, 11, last sleep study Jan2013, sees Dr. Keturah Barre  . Status post placement of cardiac pacemaker, 03/07/12 New Medtronic Gen and new Vent lead original placed 2008 03/08/2012    Past Surgical History:  Procedure Laterality Date  . APPENDECTOMY    . BACK SURGERY    . CARDIAC CATHETERIZATION    . CORONARY ARTERY BYPASS GRAFT  1990  . CORONARY ARTERY BYPASS GRAFT  2004  . ESOPHAGOGASTRODUODENOSCOPY Left 03/18/2013   Procedure: ESOPHAGOGASTRODUODENOSCOPY (EGD);  Surgeon: Cleotis Nipper, MD;  Location: [facility] ENDOSCOPY;  Service: Endoscopy;  Laterality: Left;  . FRACTURE SURGERY     Hx: of right leg  . HERNIA REPAIR    . INSERT / REPLACE / REMOVE PACEMAKER  03/07/2012   replaced  . JOINT REPLACEMENT     Hx: of left knee  . LEFT HEART CATHETERIZATION WITH CORONARY/GRAFT ANGIOGRAM N/A 12/21/2010   Procedure: LEFT HEART CATHETERIZATION WITH Beatrix Fetters;  Surgeon: Leonie Man, MD;  Location: [facility] CATH LAB;  Service: Cardiovascular;  Laterality: N/A;  . LUMBAR LAMINECTOMY/DECOMPRESSION MICRODISCECTOMY  01/10/2012   Procedure: LUMBAR LAMINECTOMY/DECOMPRESSION MICRODISCECTOMY 1 LEVEL;  Surgeon: [name], MD;  Location: [place] NEURO ORS;  Service: Neurosurgery;  Laterality: Bilateral;  Lumbar three-four  decompression, Posterior lateral fusion lumbar three-four, posterior spinus plate lumbar three-four  . MAZE Procedure  2004  . NASAL SEPTUM SURGERY N/A 14  . PACEMAKER INSERTION  08, 1/14  . PACEMAKER REVISION N/A 03/07/2012   Procedure: PACEMAKER REVISION;  Surgeon: Sanda Klein, MD;  Location: [facility] CATH LAB;  Service: Cardiovascular;  Laterality: N/A;  . POSTERIOR FUSION LUMBAR SPINE  08/28/2012   Dr Ronnald Ramp  . SHOULDER ARTHROSCOPY WITH OPEN ROTATOR CUFF REPAIR Right 03/30/2014   Procedure: RIGHT SHOULDER ARTHROSCOPY  OPEN ROTATOR CUFF REPAIR;  Surgeon: Kerin Salen, MD;  Location: [facility];  Service: Orthopedics;  Laterality: Right;  . SHOULDER OPEN ROTATOR CUFF REPAIR Right 03/30/2014   Procedure: ROTATOR CUFF REPAIR SHOULDER OPEN;  Surgeon: Kerin Salen, MD;  Location: [facility];  Service: Orthopedics;  Laterality: Right;  . STENTS    . TONSILLECTOMY      Current Medications: Outpatient Medications Prior to Visit  Medication Sig Dispense Refill  . aspirin EC 81 MG tablet Take 81 mg by mouth daily.    . carvedilol (COREG) 25 MG tablet TAKE TWO TABLETS BY MOUTH TWICE DAILY 360 tablet 2  . Cyanocobalamin (VITAMIN B-12 PO) Take 1  tablet by mouth daily.    [name] Kitchen glipiZIDE (GLUCOTROL) 10 MG tablet Take 10 mg by mouth daily as needed (for high blood surgars). For high blood sugars    . lisinopril-hydrochlorothiazide (PRINZIDE,ZESTORETIC) 20-25 MG tablet TAKE ONE TABLET BY MOUTH ONCE DAILY 90 tablet 3  . metFORMIN (GLUCOPHAGE) 500 MG tablet Take 1 tablet by mouth 4 (four) times daily.     No facility-administered medications prior to visit.      Allergies:   Patient has no known allergies.   Social History   Social History  . Marital status: Single    Spouse name: N/A  . Number of children: 1  . Years of education: 8   Occupational History  . pilot-retired    Social History Main Topics  . Smoking status: Never Smoker  . Smokeless tobacco: Never Used  . Alcohol use No  . Drug use: No  .  Sexual activity: Yes   Other Topics Concern  . None   Social History Narrative   [place], fishes.    Right-handed.   1 cup caffeine per day.     Family History:  The patient's family history includes Cancer in his mother; Other in his father.   ROS:   Please see the history of present illness.    ROS All other systems reviewed and are negative.   PHYSICAL EXAM:   VS:  BP (!) 150/78   Pulse 64   Ht 5\' 10"  (1.778 m)   Wt 100.6 kg (221 lb 12.8 oz)   BMI 31.82 kg/m    GEN: Well nourished, well developed, in no acute distress  HEENT: normal  Neck: no JVD, carotid bruits, or masses Cardiac: Widely split second heart sound, RRR; no murmurs, rubs, or gallops,no edema , healthy subclavian pacemaker site and sternotomy scar Respiratory:  clear to auscultation bilaterally, normal work of breathing GI: soft, nontender, nondistended, + BS MS: no deformity or atrophy  Skin: warm and dry, no rash Neuro:  Alert and Oriented x 3, Strength and sensation are intact Psych: euthymic mood, full affect  Wt Readings from Last 3 Encounters:  03/16/16 100.6 kg (221 lb 12.8 oz)  03/02/16 97.1 kg (214 lb)  11/16/15 95.7 kg (211 lb)      Studies/Labs Reviewed:   EKG:  EKG is ordered today.  The ekg ordered today demonstrates Atrial paced, ventricular sensed rhythm with long AV delay (234 ms) and right bundle branch block (158 ms). QTC 447 ms  Recent Labs: 09/27/2015: TSH 1.520   Lipid Panel    Component Value Date/Time   CHOL 134 12/20/2010 0903   TRIG 104 12/20/2010 0903   HDL 38 (L) 12/20/2010 0903   CHOLHDL 3.5 12/20/2010 0903   VLDL 21 12/20/2010 0903   LDLCALC 75 12/20/2010 0903    Additional studies/ records that were reviewed today include:  Notes from Dr. Tally Due    ASSESSMENT:    1. 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 Paroxysmal atrial fibrillation ([place])   3. CHB (complete heart block) (HCC)   4.  Non-sustained ventricular tachycardia ([place])   5. SSS (sick sinus syndrome) (HCC)   6. Status post placement of cardiac pacemaker   7. Essential hypertension   8. Diabetes mellitus type 2 in obese ([place])   9. Dyslipidemia   10. Mild obesity   11. Recurrent left inguinal hernia   12. POST-POLIO SYNDROME  PLAN:  In order of problems listed above:  1. CAD s/p redo CABG with exertional angina: Symptoms are quite convincing for exertional angina, CCS class 2.  Normal left ventricle systolic function by most recent echo. The symptoms may be related to poor systolic blood pressure control rather than a new coronary problem. He has had 2 separate bypass procedures, most recently 14 years ago. Secondary to his history of recurrent GI bleeding, revascularization with percutaneous measures is not appealing unless absolutely necessary . We'll try to optimize antianginal therapy, add amlodipine. Nuclear stress testing is unlikely to be helpful, if we need to pursue coronary evaluation will go straight to cardiac catheterization. 2. AFib: No episodes of atrial fibrillation for a long time, monitored using his pacemaker. He has never had an embolic event. CHADSVasc score 4 (Age, vascular disease, diabetes mellitus) confers high risk for embolism, but he has also had severe and repeated episodes of upper GI bleeding even when anticoagulation had been stopped. No episodes of serious bleeding in a couple of years now. Continue to use his device to monitor for recurrent arrhythmia and reconsider anticoagulation in the future. 3. CHB: Paroxysmal heart block was the reason for initial pacemaker implantation, but he has at least 99% normal AV conduction. 4. NSVT: His pacemaker has occasionally recorded relatively lengthy episodes of nonsustained VT, always asymptomatic. Previous workup for these events has not demonstrated active coronary insufficiency or other structural abnormalities. Continue beta blockers. 5.  SSS: He has no atrial activity and has complete sinus arrest. He is "atrial dependent" but has normal AV conduction. Heart rate histogram distribution is favorable, no changes made to sensor settings are other pacemaker parameters today. 6. PPM: Normal device function. He has failed to perform routine downloads. We'll try again to do a remote download in 3 months but will make sure he has a follow up visit in 6 months as a backup. 7. HTN: Markedly elevated systolic blood pressure may explain his recent problems with exertional angina pectoris. Will add amlodipine 5 mg daily. Asked him to send me repeat blood pressure recordings in a couple of weeks. If he still has angina once his blood pressure is well-controlled, would recommend he proceed directly to cardiac catheterization 8. DM: Good hemoglobin A1c, optimal control. Not requiring insulin 9. HLP: His LDL cholesterol is only slightly above target range. He has tried all available statins in the past and has not tolerated any of them. Relatively mild LDL cholesterol elevation. The cost of PCS K9 inhibitors seems prohibitive to him. However, if we end up performing a heart catheterization and find evidence of disease progression, we will have to discuss this again.  10. Obesity: Improved efforts at weight loss and improved diet discussed. 11. Left inguinal hernia: I don't think [name] should undergo any surgical procedures until we clarify his exertional angina symptoms. His symptoms are mostly related to the right groin in the left inguinal hernia she doesn't bother him. By CT it only has fat content, no intestinal content. It does not appear to be an urgent problem.    Medication Adjustments/Labs and Tests Ordered: Current medicines are reviewed at length with the patient today.  Concerns regarding medicines are outlined above.  Medication changes, Labs and Tests ordered today are listed in the Patient Instructions below. Patient Instructions  Dr  Sallyanne Kuster has recommended making the following medication changes: 1. START Amlodipine 5 mg - take 1 tablet by mouth daily  Your physician has requested that you regularly monitor and  record your blood pressure readings at home. Please use the same machine at the same time of day to check your readings and record them to bring to your follow-up visit.  Dr Sallyanne Kuster recommends that you schedule a follow-up appointment in 3 months.  If you need a refill on your cardiac medications before your next appointment, please call your pharmacy.    Signed, Sanda Klein, MD  03/16/2016 10:18 AM    [facility] [place], [place], [place]  [address] Phone: [phone]; Fax: [phone]

## 2016-03-22 LAB — CUP PACEART INCLINIC DEVICE CHECK
Battery Voltage: 2.79 V
Brady Statistic AP VS Percent: 99 %
Brady Statistic AS VP Percent: 0 %
Brady Statistic AS VS Percent: 1 %
Date Time Interrogation Session: 20180208134810
Implantable Lead Implant Date: 20080208
Implantable Lead Location: 753859
Implantable Lead Model: 5076
Implantable Lead Model: 5076
Lead Channel Impedance Value: 522 Ohm
Lead Channel Pacing Threshold Pulse Width: 0.4 ms
Lead Channel Pacing Threshold Pulse Width: 0.4 ms
Lead Channel Sensing Intrinsic Amplitude: 5.6 mV
Lead Channel Setting Pacing Pulse Width: 0.4 ms
Lead Channel Setting Sensing Sensitivity: 2 mV
MDC IDC LEAD IMPLANT DT: 20140130
MDC IDC LEAD LOCATION: 753860
MDC IDC MSMT BATTERY IMPEDANCE: 183 Ohm
MDC IDC MSMT BATTERY REMAINING LONGEVITY: 123 mo
MDC IDC MSMT LEADCHNL RA IMPEDANCE VALUE: 474 Ohm
MDC IDC MSMT LEADCHNL RA PACING THRESHOLD AMPLITUDE: 0.625 V
MDC IDC MSMT LEADCHNL RV PACING THRESHOLD AMPLITUDE: 0.625 V
MDC IDC PG IMPLANT DT: 20140130
MDC IDC SET LEADCHNL RA PACING AMPLITUDE: 2 V
MDC IDC SET LEADCHNL RV PACING AMPLITUDE: 2.5 V
MDC IDC STAT BRADY AP VP PERCENT: 1 %

## 2016-03-23 DIAGNOSIS — L570 Actinic keratosis: Secondary | ICD-10-CM | POA: Diagnosis not present

## 2016-03-28 DIAGNOSIS — M79671 Pain in right foot: Secondary | ICD-10-CM | POA: Diagnosis not present

## 2016-03-28 DIAGNOSIS — G4733 Obstructive sleep apnea (adult) (pediatric): Secondary | ICD-10-CM | POA: Diagnosis not present

## 2016-03-29 ENCOUNTER — Ambulatory Visit (INDEPENDENT_AMBULATORY_CARE_PROVIDER_SITE_OTHER): Payer: PPO | Admitting: Orthopedic Surgery

## 2016-03-29 ENCOUNTER — Encounter (INDEPENDENT_AMBULATORY_CARE_PROVIDER_SITE_OTHER): Payer: Self-pay | Admitting: Orthopedic Surgery

## 2016-03-29 DIAGNOSIS — M1A0710 Idiopathic chronic gout, right ankle and foot, without tophus (tophi): Secondary | ICD-10-CM

## 2016-03-29 DIAGNOSIS — M79671 Pain in right foot: Secondary | ICD-10-CM | POA: Diagnosis not present

## 2016-03-29 MED ORDER — ALLOPURINOL 100 MG PO TABS: 100.0000 mg | ORAL_TABLET | Freq: Two times a day (BID) | ORAL | 3 refills | Status: DC

## 2016-03-29 MED ORDER — COLCHICINE 0.6 MG PO TABS: 0.6000 mg | ORAL_TABLET | Freq: Two times a day (BID) | ORAL | 3 refills | Status: DC

## 2016-03-29 NOTE — Progress Notes (Signed)
Office Visit Note   Patient: Alan [name]           Date of Birth: 1940/07/12           MRN: [id number] Visit Date: 03/29/2016              Requested by: Alan Noon, MD [address] [place], [place] [address] PCP: Alan Noon, MD  Chief Complaint  Patient presents with  . Right Foot - Pain    HPI: Patient is 76 y.o male who presents today right foot pain. He ambulates today full weightbearing with cam walker. There is redness. He has history of polio and had surgery for lengthening for leg. He has prominent swelling. He states he has multiple surgeries. There is redness. He was put on antibiotic but cannot recall the name. Alan [name], RT   Patient reports multiple surgeries done years ago secondary to polio with Ilizarov fixation for leg lengthening and fusion of the ankle hindfoot and midfoot by podiatry. Patient reports acute pain redness and swelling. I discussed his case with Dr. Ovid [name] yesterday and patient was started on doxycycline. Lab work was drawn this morning and uric acid was ordered. Of note patient does have a pacemaker. Assessment & Plan: Visit Diagnoses:  1. Idiopathic chronic gout of right foot without tophus     Plan: The area of redness and swelling was aspirated tophaceous gout was aspirated from the site. There was no purulence. We will have the patient continue the doxycycline for 1 week in case there is an infection involved with the acute gout. He was called in a prescription for colchicine to take 0.6 mg twice a day until symptoms resolve and a prescription for allopurinol to take 100 mg twice a day. We will follow up next week.  Follow-Up Instructions: Return in about 1 week (around 04/05/2016).   Ortho Exam On examination patient is alert oriented no adenopathy well-dressed normal affect respiratory effort he does have an antalgic gait he has a palpable dorsalis pedis pulse the dorsal lateral as active as foot is red and painful and  swollen. He has a pointing area which may be either acute gout or acute infection. After informed consent and sterile prepping the area was locally anesthetized with 6 mL 1% lidocaine plain 18-gauge needle was inserted and tophaceous gout was aspirated. There is no purulence.  Imaging: No results found.  Orders:  No orders of the defined types were placed in this encounter.  Meds ordered this encounter  Medications  . colchicine 0.6 MG tablet    Sig: Take 1 tablet (0.6 mg total) by mouth 2 (two) times daily.    Dispense:  60 tablet    Refill:  3  . allopurinol (ZYLOPRIM) 100 MG tablet    Sig: Take 1 tablet (100 mg total) by mouth 2 (two) times daily.    Dispense:  60 tablet    Refill:  3     Procedures: No procedures performed  Clinical Data: No additional findings.  Subjective: Review of Systems  Objective: Vital Signs: There were no vitals taken for this visit.  Specialty Comments:  No specialty comments available.  PMFS History: Patient Active Problem List   Diagnosis Date Noted  . Mild obesity 03/16/2016  . Recurrent left inguinal hernia 03/16/2016  . Acute confusional state 09/27/2015  . Memory loss 09/27/2015  . GI bleed 03/19/2013  . Melena 03/17/2013  . CHB (complete heart block) ([place]) 10/13/2012  . Dyslipidemia  07/11/2012  . Preop cardiovascular exam 07/11/2012  . Ischemic cardiomyopathy 07/11/2012  . Pacemaker - Medtronic Adapta dual chamber 03/08/2012  . Non-sustained ventricular tachycardia ([place]) 12/21/2010  . Hypertension 12/21/2010  . SSS (sick sinus syndrome) ([place])   . DYSPNEA ON EXERTION 12/23/2009  . 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 Diabetes mellitus type 2 in obese ([place]) 12/12/2007  . Obstructive sleep apnea 12/12/2007  . Coronary atherosclerosis 12/12/2007  . ATRIAL FIBRILLATION, PAROXYSMAL 12/12/2007  . 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 A-fib ([place])   .  Anticoagulant long-term use, on coumadin for PAF 03/08/2012  . Chronic back pain   . Complete heart block ([place])   . Coronary artery disease    seen at [facility] cardiology   . Diabetes mellitus   . Hx of echocardiogram 06/29/2008   EF 45-50%   . Hypertension    Dr. Veronia [name], primary  . Neuromuscular disorder ([place])    carpal tunnel bilaterally  . 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 Polio    hx of  . Sleep apnea    cpap, 11, last sleep study Jan2013, sees Dr. Keturah [name]  . Status post placement of cardiac pacemaker, 03/07/12 New Medtronic Gen and new Vent lead original placed 2008 03/08/2012    Family History  Problem Relation Age of Onset  . Other Father     MVA  . Cancer Mother     Past Surgical History:  Procedure Laterality Date  . APPENDECTOMY    . BACK SURGERY    . CARDIAC CATHETERIZATION    . CORONARY ARTERY BYPASS GRAFT  1990  . CORONARY ARTERY BYPASS GRAFT  2004  . ESOPHAGOGASTRODUODENOSCOPY Left 03/18/2013   Procedure: ESOPHAGOGASTRODUODENOSCOPY (EGD);  Surgeon: Alan Nipper, MD;  Location[facility] ENDOSCOPY;  Service: Endoscopy;  Laterality: Left;  . FRACTURE SURGERY     Hx: of right leg  . HERNIA REPAIR    . INSERT / REPLACE / REMOVE PACEMAKER  03/07/2012   replaced  . JOINT REPLACEMENT     Hx: of left knee  . LEFT HEART CATHETERIZATION WITH CORONARY/GRAFT ANGIOGRAM N/A 12/21/2010   Procedure: LEFT HEART CATHETERIZATION WITH Alan [name];  Surgeon: Alan Man, MD;  Location: [facility] CATH LAB;  Service: Cardiovascular;  Laterality: N/A;  . LUMBAR LAMINECTOMY/DECOMPRESSION MICRODISCECTOMY  01/10/2012   Procedure: LUMBAR LAMINECTOMY/DECOMPRESSION MICRODISCECTOMY 1 LEVEL;  Surgeon: Alan Moore, MD;  Location: [facility] NEURO ORS;  Service: Neurosurgery;  Laterality: Bilateral;  Lumbar three-four decompression, Posterior lateral fusion lumbar three-four, posterior spinus plate lumbar three-four  . MAZE Procedure  2004  . NASAL SEPTUM SURGERY N/A 14  .  PACEMAKER INSERTION  08, 1/14  . PACEMAKER REVISION N/A 03/07/2012   Procedure: PACEMAKER REVISION;  Surgeon: Alan Klein, MD;  Location: [facility] CATH LAB;  Service: Cardiovascular;  Laterality: N/A;  . POSTERIOR FUSION LUMBAR SPINE  08/28/2012   Dr Ronnald Ramp  . SHOULDER ARTHROSCOPY WITH OPEN ROTATOR CUFF REPAIR Right 03/30/2014   Procedure: RIGHT SHOULDER ARTHROSCOPY  OPEN ROTATOR CUFF REPAIR;  Surgeon: Kerin Salen, MD;  Location: [facility];  Service: Orthopedics;  Laterality: Right;  . SHOULDER OPEN ROTATOR CUFF REPAIR Right 03/30/2014   Procedure: ROTATOR CUFF REPAIR SHOULDER OPEN;  Surgeon: Kerin Salen, MD;  Location: [facility];  Service: Orthopedics;  Laterality: Right;  . STENTS    . TONSILLECTOMY     Social History   Occupational History  .  pilot-retired    Social History Main Topics  . Smoking status: Never Smoker  . Smokeless tobacco: Never Used  . Alcohol use No  . Drug use: No  . Sexual activity: Yes

## 2016-03-30 ENCOUNTER — Other Ambulatory Visit: Payer: Self-pay | Admitting: Neurological Surgery

## 2016-03-30 DIAGNOSIS — M545 Low back pain: Principal | ICD-10-CM

## 2016-03-30 DIAGNOSIS — G8929 Other chronic pain: Secondary | ICD-10-CM

## 2016-03-31 ENCOUNTER — Encounter: Payer: Self-pay | Admitting: Cardiology

## 2016-03-31 NOTE — Progress Notes (Signed)
Remote pacemaker transmission.   

## 2016-04-05 ENCOUNTER — Ambulatory Visit (INDEPENDENT_AMBULATORY_CARE_PROVIDER_SITE_OTHER): Payer: PPO | Admitting: Orthopedic Surgery

## 2016-04-05 ENCOUNTER — Encounter (INDEPENDENT_AMBULATORY_CARE_PROVIDER_SITE_OTHER): Payer: Self-pay | Admitting: Orthopedic Surgery

## 2016-04-05 VITALS — Ht 70.0 in | Wt 221.0 lb

## 2016-04-05 DIAGNOSIS — M1A9XX1 Chronic gout, unspecified, with tophus (tophi): Secondary | ICD-10-CM | POA: Diagnosis not present

## 2016-04-05 DIAGNOSIS — M25571 Pain in right ankle and joints of right foot: Secondary | ICD-10-CM | POA: Diagnosis not present

## 2016-04-05 DIAGNOSIS — M1A0710 Idiopathic chronic gout, right ankle and foot, without tophus (tophi): Secondary | ICD-10-CM | POA: Insufficient documentation

## 2016-04-05 NOTE — Progress Notes (Signed)
Office Visit Note   Patient: Alan [name]           Date of Birth: September 18, 1940           MRN: [id number] Visit Date: 04/05/2016              Requested by: [name], MD [address] [place], [place] [address] PCP: [name], MD  Chief Complaint  Patient presents with  . Right Foot - Follow-up    HPI: 1 week follow up tophaceous gout right foot. Patient is taking  allopurinol, colchicine and doxycycline bid and states that he has this for another 3 days. He is full weight bearing and in a regular shoe and that he is feeling much better. He states that even though the foot is swollen that is has decreased greatly. Pamella Pert, RMA   Patient is a 76 year old gentleman seen today in follow up for gout right foot. States foot pain is much improved but not resolved. Continues with colchicine and allopurinol. No issues with ambulation due to pain.   Assessment & Plan: Visit Diagnoses:  1. Tophaceous gout   2. Pain in right ankle and joints of right foot     Plan: Follow up in office in 4 weeks. Plan to check uric acid at that time. Would like to talk to Dr. Sharol Given about "shaving a bone spur" at that time.  Follow-Up Instructions: Return in about 4 weeks (around 05/03/2016).   Ortho Exam Physical Exam  Constitutional: Appears well-developed.  Head: Normocephalic.  Eyes: EOM are normal.  Neck: Normal range of motion.  Cardiovascular: Normal rate.   Pulmonary/Chest: Effort normal.  Neurological: Is alert.  Skin: Skin is warm.  Psychiatric: Has a normal mood and affect. Right foot: Healed surgical scars. Much improved swelling. No erythema. Continues to have global tenderness, though is improved. Steady gait.   Imaging: No results found.  Orders:  No orders of the defined types were placed in this encounter.  No orders of the defined types were placed in this encounter.    Procedures: No procedures performed  Clinical Data: No additional  findings.  Subjective: Review of Systems  Constitutional: Negative for chills and fever.  Musculoskeletal: Positive for arthralgias. Negative for gait problem.  Skin: Negative for color change and wound.    Objective: Vital Signs: Ht 5\' 10"  (1.778 m)   Wt 221 lb (100.2 kg)   BMI 31.71 kg/m   Specialty Comments:  No specialty comments available.  PMFS History: Patient Active Problem List   Diagnosis Date Noted  . Idiopathic chronic gout of right foot without tophus 04/05/2016  . Tophaceous gout 04/05/2016  . Mild obesity 03/16/2016  . Recurrent left inguinal hernia 03/16/2016  . Acute confusional state 09/27/2015  . Memory loss 09/27/2015  . GI bleed 03/19/2013  . Melena 03/17/2013  . CHB (complete heart block) ([place]) 10/13/2012  . Dyslipidemia 07/11/2012  . Preop cardiovascular exam 07/11/2012  . Ischemic cardiomyopathy 07/11/2012  . Pacemaker - Medtronic Adapta dual chamber 03/08/2012  . Non-sustained ventricular tachycardia ([place]) 12/21/2010  . Hypertension 12/21/2010  . SSS (sick sinus syndrome) ([place])   . DYSPNEA ON EXERTION 12/23/2009  . 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 Diabetes mellitus type 2 in obese ([place]) 12/12/2007  . Obstructive sleep apnea 12/12/2007  . Coronary atherosclerosis 12/12/2007  . ATRIAL FIBRILLATION, PAROXYSMAL 12/12/2007  . 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 A-fib ([place])   . Anticoagulant long-term use, on coumadin for PAF 03/08/2012  . Chronic back pain   . Complete heart block ([place])   . Coronary artery disease    seen at [facility]ology   . Diabetes mellitus   . Hx of echocardiogram 06/29/2008   EF 45-50%   . Hypertension    Dr. Veronia Beets, primary  . Neuromuscular disorder ([place])    carpal tunnel bilaterally  . 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 Polio    hx of  . Sleep apnea    cpap, 11, last sleep study Jan2013, sees Dr. Keturah Barre  . Status  post placement of cardiac pacemaker, 03/07/12 New Medtronic Gen and new Vent lead original placed 2008 03/08/2012    Family History  Problem Relation Age of Onset  . Other Father     MVA  . Cancer Mother     Past Surgical History:  Procedure Laterality Date  . APPENDECTOMY    . BACK SURGERY    . CARDIAC CATHETERIZATION    . CORONARY ARTERY BYPASS GRAFT  1990  . CORONARY ARTERY BYPASS GRAFT  2004  . ESOPHAGOGASTRODUODENOSCOPY Left 03/18/2013   Procedure: ESOPHAGOGASTRODUODENOSCOPY (EGD);  Surgeon: Cleotis Nipper, MD;  Location: [facility] ENDOSCOPY;  Service: Endoscopy;  Laterality: Left;  . FRACTURE SURGERY     Hx: of right leg  . HERNIA REPAIR    . INSERT / REPLACE / REMOVE PACEMAKER  03/07/2012   replaced  . JOINT REPLACEMENT     Hx: of left knee  . LEFT HEART CATHETERIZATION WITH CORONARY/GRAFT ANGIOGRAM N/A 12/21/2010   Procedure: LEFT HEART CATHETERIZATION WITH Beatrix Fetters;  Surgeon: Leonie Man, MD;  Location: [facility] CATH LAB;  Service: Cardiovascular;  Laterality: N/A;  . LUMBAR LAMINECTOMY/DECOMPRESSION MICRODISCECTOMY  01/10/2012   Procedure: LUMBAR LAMINECTOMY/DECOMPRESSION MICRODISCECTOMY 1 LEVEL;  Surgeon: [name], MD;  Location: [facility] NEURO ORS;  Service: Neurosurgery;  Laterality: Bilateral;  Lumbar three-four decompression, Posterior lateral fusion lumbar three-four, posterior spinus plate lumbar three-four  . MAZE Procedure  2004  . NASAL SEPTUM SURGERY N/A 14  . PACEMAKER INSERTION  08, 1/14  . PACEMAKER REVISION N/A 03/07/2012   Procedure: PACEMAKER REVISION;  Surgeon: Sanda Klein, MD;  Location: [facility] CATH LAB;  Service: Cardiovascular;  Laterality: N/A;  . POSTERIOR FUSION LUMBAR SPINE  08/28/2012   Dr Ronnald Ramp  . SHOULDER ARTHROSCOPY WITH OPEN ROTATOR CUFF REPAIR Right 03/30/2014   Procedure: RIGHT SHOULDER ARTHROSCOPY  OPEN ROTATOR CUFF REPAIR;  Surgeon: Kerin Salen, MD;  Location: [facility];  Service: Orthopedics;  Laterality: Right;  . SHOULDER OPEN ROTATOR  CUFF REPAIR Right 03/30/2014   Procedure: ROTATOR CUFF REPAIR SHOULDER OPEN;  Surgeon: Kerin Salen, MD;  Location: [facility];  Service: Orthopedics;  Laterality: Right;  . STENTS    . TONSILLECTOMY     Social History   Occupational History  . pilot-retired    Social History Main Topics  . Smoking status: Never Smoker  . Smokeless tobacco: Never Used  . Alcohol use No  . Drug use: No  . Sexual activity: Yes

## 2016-04-10 ENCOUNTER — Ambulatory Visit
Admission: RE | Admit: 2016-04-10 | Discharge: 2016-04-10 | Disposition: A | Discharge status: Home / Self Care | Payer: PPO | Source: Ambulatory Visit | Attending: Neurological Surgery | Admitting: Neurological Surgery

## 2016-04-10 DIAGNOSIS — G8929 Other chronic pain: Secondary | ICD-10-CM

## 2016-04-10 DIAGNOSIS — M545 Low back pain: Principal | ICD-10-CM

## 2016-04-10 DIAGNOSIS — M48061 Spinal stenosis, lumbar region without neurogenic claudication: Secondary | ICD-10-CM | POA: Diagnosis not present

## 2016-04-10 MED ORDER — MEPERIDINE HCL 100 MG/ML IJ SOLN
75.0000 mg | Freq: Once | INTRAMUSCULAR | Status: AC
  Administered 2016-04-10: 75 mg via INTRAMUSCULAR

## 2016-04-10 MED ORDER — ONDANSETRON HCL 4 MG/2ML IJ SOLN
4.0000 mg | Freq: Once | INTRAMUSCULAR | Status: AC
  Administered 2016-04-10: 4 mg via INTRAMUSCULAR

## 2016-04-10 MED ORDER — ONDANSETRON HCL 4 MG/2ML IJ SOLN
4.0000 mg | Freq: Four times a day (QID) | INTRAMUSCULAR | Status: DC | PRN
Start: 2016-04-10 — End: 2016-04-11

## 2016-04-10 MED ORDER — DIAZEPAM 5 MG PO TABS
5.0000 mg | ORAL_TABLET | Freq: Once | ORAL | Status: AC
  Administered 2016-04-10: 5 mg via ORAL

## 2016-04-10 MED ORDER — IOPAMIDOL (ISOVUE-M 200) INJECTION 41%
15.0000 mL | Freq: Once | INTRAMUSCULAR | Status: AC
  Administered 2016-04-10: 15 mL via INTRATHECAL

## 2016-04-10 NOTE — Discharge Instructions (Signed)
Myelogram Discharge Instructions  1. Go home and rest quietly for the next 24 hours.  It is important to lie flat for the next 24 hours.  Get up only to go to the restroom.  You may lie in the bed or on a couch on your back, your stomach, your left side or your right side.  You may have one pillow under your head.  You may have pillows between your knees while you are on your side or under your knees while you are on your back.  2. DO NOT drive today.  Recline the seat as far back as it will go, while still wearing your seat belt, on the way home.  3. You may get up to go to the bathroom as needed.  You may sit up for 10 minutes to eat.  You may resume your normal diet and medications unless otherwise indicated.  Drink lots of extra fluids today and tomorrow.  4. The incidence of headache, nausea, or vomiting is about 5% (one in 20 patients).  If you develop a headache, lie flat and drink plenty of fluids until the headache goes away.  Caffeinated beverages may be helpful.  If you develop severe nausea and vomiting or a headache that does not go away with flat bed rest, call [phone].  5. You may resume normal activities after your 24 hours of bed rest is over; however, do not exert yourself strongly or do any heavy lifting tomorrow. If when you get up you have a headache when standing, go back to bed and force fluids for another 24 hours.  6. Call your physician for a follow-up appointment.  The results of your myelogram will be sent directly to your physician by the following day.  7. If you have any questions or if complications develop after you arrive home, please call [phone].  Discharge instructions have been explained to the patient.  The patient, or the person responsible for the patient, fully understands these instructions.      May resume Tramadol on April 11, 2016, after 9:30 am.

## 2016-04-10 NOTE — Progress Notes (Signed)
Patient states he has been off Tramadol for at least the past two days.  jkl

## 2016-04-14 ENCOUNTER — Encounter: Payer: Self-pay | Admitting: Cardiology

## 2016-04-17 ENCOUNTER — Other Ambulatory Visit: Payer: Self-pay | Admitting: Neurological Surgery

## 2016-04-17 DIAGNOSIS — M48061 Spinal stenosis, lumbar region without neurogenic claudication: Secondary | ICD-10-CM | POA: Diagnosis not present

## 2016-04-20 ENCOUNTER — Telehealth: Payer: Self-pay

## 2016-04-20 ENCOUNTER — Encounter: Payer: Self-pay | Admitting: Cardiovascular Disease

## 2016-04-20 NOTE — Telephone Encounter (Signed)
Request for surgical clearance:   1. What type surgery is being performed? L2-3, L3-4 Laminectomy/Removal of hardware under general anesthesia  2. When is this surgery scheduled? 05/17/16  3. Are there any medications that need to be held prior to surgery and how long? N/A; pt takes ASA 81 mg QD  4. Name of the physician performing surgery: Dr [name]  5. What is the office phone and fax number?   Phone [phone]  Fax [phone]

## 2016-04-20 NOTE — Telephone Encounter (Signed)
epicd 

## 2016-04-21 NOTE — Telephone Encounter (Signed)
Clearance letter routed via epic.

## 2016-04-25 DIAGNOSIS — G4733 Obstructive sleep apnea (adult) (pediatric): Secondary | ICD-10-CM | POA: Diagnosis not present

## 2016-05-01 ENCOUNTER — Ambulatory Visit (INDEPENDENT_AMBULATORY_CARE_PROVIDER_SITE_OTHER): Payer: PPO | Admitting: Orthopedic Surgery

## 2016-05-01 ENCOUNTER — Encounter (INDEPENDENT_AMBULATORY_CARE_PROVIDER_SITE_OTHER): Payer: Self-pay | Admitting: Orthopedic Surgery

## 2016-05-01 DIAGNOSIS — M1A09X1 Idiopathic chronic gout, multiple sites, with tophus (tophi): Secondary | ICD-10-CM | POA: Diagnosis not present

## 2016-05-01 LAB — HEPATIC FUNCTION PANEL
ALBUMIN: 3.7 g/dL (ref 3.6–5.1)
ALT: 17 U/L (ref 9–46)
AST: 16 U/L (ref 10–35)
Alkaline Phosphatase: 45 U/L (ref 40–115)
BILIRUBIN DIRECT: 0.1 mg/dL (ref <=0.2)
Indirect Bilirubin: 0.5 mg/dL (ref 0.2–1.2)
TOTAL PROTEIN: 7.1 g/dL (ref 6.1–8.1)
Total Bilirubin: 0.6 mg/dL (ref 0.2–1.2)

## 2016-05-01 MED ORDER — COLCHICINE 0.6 MG PO TABS: 0.6000 mg | ORAL_TABLET | Freq: Two times a day (BID) | ORAL | 3 refills | Status: DC | PRN

## 2016-05-01 MED ORDER — ALLOPURINOL 100 MG PO TABS
100.0000 mg | ORAL_TABLET | Freq: Two times a day (BID) | ORAL | 3 refills | Status: DC
Start: 2016-05-01 — End: 2016-12-22

## 2016-05-01 NOTE — Progress Notes (Signed)
Office Visit Note   Patient: Alan [name]           Date of Birth: [birth year]           MRN: [id number] Visit Date: 05/01/2016              Requested by: [name], MD [address] [place], [place] [address] PCP: [name], MD  Chief Complaint  Patient presents with  . Right Foot - Follow-up    HPI: Patient states that his gout flareup has resolved but he noticed a tophaceous deposit of the dorsal lateral aspect of the right foot. Of note patient states that he has had failure of the internal fusion fixation of his spine and is planning revision surgery in several weeks with Dr. Ronnald Ramp. Patient's medication had run out and he stopped taking the colchicine and the allopurinol.  Assessment & Plan: Visit Diagnoses:  1. Idiopathic chronic gout of multiple sites with tophus     Plan: Uric acid level drawn today we may need to adjust the allopurinol level. Will follow up after his revision lumbar spine fusion surgery. Discussed that if we have to we could proceed with removal of the tophaceous deposit dorsal lateral aspect of the right foot and stated that with all the multiple incisions and he has over the foot he is at risk of the wound not healing.  Follow-Up Instructions: Return in about 2 months (around 07/01/2016).   Ortho Exam  Patient is alert, oriented, no adenopathy, well-dressed, normal affect, normal respiratory effort. Patient has an antalgic gait. His foot is nontender to palpation and there is no signs or symptoms of acute gout. He does have a palpable tophaceous deposit dorsal lateral aspect of the right foot there are multiple transverse incisions around this tophaceous deposit. There is no redness no cellulitis no signs of infection.  Imaging: No results found.  Labs: Lab Results  Component Value Date   HGBA1C 7.7 (H) 03/26/2014   HGBA1C 6.2 (H) 12/20/2010   HGBA1C (H) 07/17/2006    6.8 (NOTE)   The ADA recommends the following therapeutic  goals for glycemic   control related to Hgb A1C measurement:   Goal of Therapy:   < 7.0% Hgb A1C   Action Suggested:  > 8.0% Hgb A1C   Ref:  Diabetes Care, 22, Suppl. 1, 1999    Orders:  Orders Placed This Encounter  Procedures  . Uric acid  . Hepatic function panel   Meds ordered this encounter  Medications  . colchicine 0.6 MG tablet    Sig: Take 1 tablet (0.6 mg total) by mouth 2 (two) times daily as needed. for gout flareup    Dispense:  60 tablet    Refill:  3  . allopurinol (ZYLOPRIM) 100 MG tablet    Sig: Take 1 tablet (100 mg total) by mouth 2 (two) times daily.    Dispense:  60 tablet    Refill:  3     Procedures: No procedures performed  Clinical Data: No additional findings.  ROS: Review of Systems  Objective: Vital Signs: There were no vitals taken for this visit.  Specialty Comments:  No specialty comments available.  PMFS History: Patient Active Problem List   Diagnosis Date Noted  . Idiopathic chronic gout of multiple sites with tophus 05/01/2016  . Idiopathic chronic gout of right foot without tophus 04/05/2016  . Tophaceous gout 04/05/2016  . Mild obesity 03/16/2016  . Recurrent left inguinal hernia 03/16/2016  .  Allergic rhinitis 09/30/2015  . Acute confusional state 09/27/2015  . Memory loss 09/27/2015  . History of ASCVD 08/11/2014  . Right inguinal hernia 07/17/2014  . Ventral hernia without obstruction or gangrene 07/17/2014  . Acute blood loss anemia 07/04/2013  . CKD (chronic kidney disease), stage III 07/04/2013  . GI bleed 03/19/2013  . Melena 03/17/2013  . CHB (complete heart block) ([place]) 10/13/2012  . Dyslipidemia 07/11/2012  . Preop cardiovascular exam 07/11/2012  . Ischemic cardiomyopathy 07/11/2012  . Myocardial ischemia 07/11/2012  . Pacemaker - Medtronic Adapta dual chamber 03/08/2012  . Non-sustained ventricular tachycardia ([place]) 12/21/2010  . Hypertension 12/21/2010  . Paroxysmal ventricular tachycardia ([place]) 12/21/2010    . SSS (sick sinus syndrome) ([place])   . Hyperlipidemia 11/03/2010  . DYSPNEA ON EXERTION 12/23/2009  . 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 Diabetes mellitus type 2 in obese ([place]) 12/12/2007  . Obstructive sleep apnea 12/12/2007  . Coronary atherosclerosis 12/12/2007  . ATRIAL FIBRILLATION, PAROXYSMAL 12/12/2007  . 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 Status post aorto-coronary artery bypass graft 12/12/2007   Past Medical History:  Diagnosis Date  . A-fib ([place])   . Anticoagulant long-term use, on coumadin for PAF 03/08/2012  . Chronic back pain   . Complete heart block ([place])   . Coronary artery disease    seen at [facility] cardiology   . Diabetes mellitus   . Hx of echocardiogram 06/29/2008   EF 45-50%   . Hypertension    Dr. Veronia Beets, primary  . Neuromuscular disorder ([place])    carpal tunnel bilaterally  . 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 Polio    hx of  . Sleep apnea    cpap, 11, last sleep study Jan2013, sees Dr. Keturah Barre  . Status post placement of cardiac pacemaker, 03/07/12 New Medtronic Gen and new Vent lead original placed 2008 03/08/2012    Family History  Problem Relation Age of Onset  . Other Father     MVA  . Cancer Mother     Past Surgical History:  Procedure Laterality Date  . APPENDECTOMY    . BACK SURGERY    . CARDIAC CATHETERIZATION    . CORONARY ARTERY BYPASS GRAFT  1990  . CORONARY ARTERY BYPASS GRAFT  2004  . ESOPHAGOGASTRODUODENOSCOPY Left 03/18/2013   Procedure: ESOPHAGOGASTRODUODENOSCOPY (EGD);  Surgeon: Cleotis Nipper, MD;  Location: [facility] ENDOSCOPY;  Service: Endoscopy;  Laterality: Left;  . FRACTURE SURGERY     Hx: of right leg  . HERNIA REPAIR    . INSERT / REPLACE / REMOVE PACEMAKER  03/07/2012   replaced  . JOINT REPLACEMENT     Hx: of left knee  . LEFT HEART CATHETERIZATION WITH CORONARY/GRAFT ANGIOGRAM N/A 12/21/2010   Procedure: LEFT HEART CATHETERIZATION WITH Beatrix Fetters;   Surgeon: Leonie Man, MD;  Location: [facility] CATH LAB;  Service: Cardiovascular;  Laterality: N/A;  . LUMBAR LAMINECTOMY/DECOMPRESSION MICRODISCECTOMY  01/10/2012   Procedure: LUMBAR LAMINECTOMY/DECOMPRESSION MICRODISCECTOMY 1 LEVEL;  Surgeon: [name], MD;  Location: [facility] NEURO ORS;  Service: Neurosurgery;  Laterality: Bilateral;  Lumbar three-four decompression, Posterior lateral fusion lumbar three-four, posterior spinus plate lumbar three-four  . MAZE Procedure  2004  . NASAL SEPTUM SURGERY N/A 14  . PACEMAKER INSERTION  08, 1/14  . PACEMAKER REVISION N/A 03/07/2012   Procedure: PACEMAKER REVISION;  Surgeon: Sanda Klein, MD;  Location: [facility] CATH LAB;  Service: Cardiovascular;  Laterality: N/A;  . POSTERIOR FUSION LUMBAR SPINE  08/28/2012   Dr Ronnald Ramp  . SHOULDER ARTHROSCOPY WITH OPEN ROTATOR CUFF REPAIR Right 03/30/2014   Procedure: RIGHT SHOULDER ARTHROSCOPY  OPEN ROTATOR CUFF REPAIR;  Surgeon: Kerin Salen, MD;  Location: [facility];  Service: Orthopedics;  Laterality: Right;  . SHOULDER OPEN ROTATOR CUFF REPAIR Right 03/30/2014   Procedure: ROTATOR CUFF REPAIR SHOULDER OPEN;  Surgeon: Kerin Salen, MD;  Location: [facility];  Service: Orthopedics;  Laterality: Right;  . STENTS    . TONSILLECTOMY     Social History   Occupational History  . pilot-retired    Social History Main Topics  . Smoking status: Never Smoker  . Smokeless tobacco: Never Used  . Alcohol use No  . Drug use: No  . Sexual activity: Yes

## 2016-05-02 LAB — URIC ACID: Uric Acid, Serum: 7.9 mg/dL (ref 4.0–8.0)

## 2016-05-08 ENCOUNTER — Other Ambulatory Visit (HOSPITAL_COMMUNITY): Payer: Self-pay | Admitting: *Deleted

## 2016-05-08 NOTE — Pre-Procedure Instructions (Signed)
Alan [name]  05/08/2016      [facility] [place], [place] - [name] [place] [address] Phone: [phone] Fax: [phone]    Your procedure is scheduled on 05-18-2016  Thursday   Report to [facility] Admitting at 8:00A.M .  Call this number if you have problems the morning of surgery:  [phone]   Remember:  Do not eat food or drink liquids after midnight.   Take these medicines the morning of surgery with A SIP OF WATER Tylenol if needed,allopurinol(Zyloprim),amlodipine(Norvasc),carvedilol(Coreg),   STOP ASPIRIN,ANTIINFLAMATORIES (IBUPROFEN,ALEVE,MOTRIN,ADVIL,GOODY'S POWDERS),HERBAL SUPPLEMENTS,FISH OIL,AND VITAMINS 5-7 DAYS PRIOR TO SURGERY      How to Manage Your Diabetes Before and After Surgery  Why is it important to control my blood sugar before and after surgery? . Improving blood sugar levels before and after surgery helps healing and can limit problems. . A way of improving blood sugar control is eating a healthy diet by: o  Eating less sugar and carbohydrates o  Increasing activity/exercise o  Talking with your doctor about reaching your blood sugar goals . High blood sugars (greater than 180 mg/dL) can raise your risk of infections and slow your recovery, so you will need to focus on controlling your diabetes during the weeks before surgery. . Make sure that the doctor who takes care of your diabetes knows about your planned surgery including the date and location.  How do I manage my blood sugar before surgery? . Check your blood sugar at least 4 times a day, starting 2 days before surgery, to make sure that the level is not too high or low. o Check your blood sugar the morning of your surgery when you wake up and every 2 hours until you get to the Short Stay unit. . If your blood sugar is less than 70 mg/dL, you will need to treat for low blood sugar: o Do not take  insulin. o Treat a low blood sugar (less than 70 mg/dL) with  cup of clear juice (cranberry or apple), 4 glucose tablets, OR glucose gel. o Recheck blood sugar in 15 minutes after treatment (to make sure it is greater than 70 mg/dL). If your blood sugar is not greater than 70 mg/dL on recheck, call [phone] for further instructions. . Report your blood sugar to the short stay nurse when you get to Short Stay.  . If you are admitted to the hospital after surgery: o Your blood sugar will be checked by the staff and you will probably be given insulin after surgery (instead of oral diabetes medicines) to make sure you have good blood sugar levels. o The goal for blood sugar control after surgery is 80-180 mg/dL.              WHAT DO I DO ABOUT MY DIABETES MEDICATION?   [name] Kitchen Do not take oral diabetes medicines (pills) the morning of surgery.Glipizide(Glucotrol),Metformin(Glucophage)      Reviewed and Endorsed by [facility] Patient Education Committee, August 2015   Do not wear jewelry, make-up or nail polish.  Do not wear lotions, powders, or perfumes, or deoderant.  Do not shave 48 hours prior to surgery.  Men may shave face and neck.  Do not bring valuables to the hospital.  [facility] is not responsible for any belongings or valuables.  Contacts, dentures or bridgework may not be worn into surgery.  Leave your suitcase in the car.  After surgery it may  be brought to your room.  For patients admitted to the hospital, discharge time will be determined by your treatment team.  Patients discharged the day of surgery will not be allowed to drive home.

## 2016-05-09 ENCOUNTER — Encounter (HOSPITAL_COMMUNITY)
Admission: RE | Admit: 2016-05-09 | Discharge: 2016-05-09 | Disposition: A | Discharge status: Home / Self Care | Payer: PPO | Source: Ambulatory Visit | Attending: Neurological Surgery | Admitting: Neurological Surgery

## 2016-05-09 ENCOUNTER — Encounter (HOSPITAL_COMMUNITY): Payer: Self-pay

## 2016-05-09 ENCOUNTER — Ambulatory Visit (HOSPITAL_COMMUNITY)
Admission: RE | Admit: 2016-05-09 | Discharge: 2016-05-09 | Disposition: A | Discharge status: Home / Self Care | Payer: PPO | Source: Ambulatory Visit | Attending: Neurological Surgery | Admitting: Neurological Surgery

## 2016-05-09 DIAGNOSIS — Z01818 Encounter for other preprocedural examination: Secondary | ICD-10-CM | POA: Insufficient documentation

## 2016-05-09 DIAGNOSIS — M48061 Spinal stenosis, lumbar region without neurogenic claudication: Secondary | ICD-10-CM | POA: Insufficient documentation

## 2016-05-09 DIAGNOSIS — Z951 Presence of aortocoronary bypass graft: Secondary | ICD-10-CM | POA: Insufficient documentation

## 2016-05-09 DIAGNOSIS — Z01812 Encounter for preprocedural laboratory examination: Secondary | ICD-10-CM | POA: Insufficient documentation

## 2016-05-09 DIAGNOSIS — Z950 Presence of cardiac pacemaker: Secondary | ICD-10-CM | POA: Insufficient documentation

## 2016-05-09 DIAGNOSIS — I700 Atherosclerosis of aorta: Secondary | ICD-10-CM | POA: Insufficient documentation

## 2016-05-09 DIAGNOSIS — I10 Essential (primary) hypertension: Secondary | ICD-10-CM | POA: Diagnosis not present

## 2016-05-09 LAB — CBC WITH DIFFERENTIAL/PLATELET
BASOS PCT: 1 %
Basophils Absolute: 0.1 10*3/uL (ref 0.0–0.1)
EOS ABS: 0.1 10*3/uL (ref 0.0–0.7)
Eosinophils Relative: 3 %
HCT: 36.2 % — ABNORMAL LOW (ref 39.0–52.0)
HEMOGLOBIN: 12.1 g/dL — AB (ref 13.0–17.0)
Lymphocytes Relative: 21 %
Lymphs Abs: 1.2 10*3/uL (ref 0.7–4.0)
MCH: 30.9 pg (ref 26.0–34.0)
MCHC: 33.4 g/dL (ref 30.0–36.0)
MCV: 92.6 fL (ref 78.0–100.0)
MONOS PCT: 8 %
Monocytes Absolute: 0.4 10*3/uL (ref 0.1–1.0)
NEUTROS PCT: 67 %
Neutro Abs: 3.8 10*3/uL (ref 1.7–7.7)
PLATELETS: 189 10*3/uL (ref 150–400)
RBC: 3.91 MIL/uL — ABNORMAL LOW (ref 4.22–5.81)
RDW: 13.5 % (ref 11.5–15.5)
WBC: 5.6 10*3/uL (ref 4.0–10.5)

## 2016-05-09 LAB — PROTIME-INR
INR: 1.07
PROTHROMBIN TIME: 13.9 s (ref 11.4–15.2)

## 2016-05-09 LAB — BASIC METABOLIC PANEL
Anion gap: 11 (ref 5–15)
BUN: 30 mg/dL — ABNORMAL HIGH (ref 6–20)
CALCIUM: 9.6 mg/dL (ref 8.9–10.3)
CO2: 23 mmol/L (ref 22–32)
CREATININE: 1.49 mg/dL — AB (ref 0.61–1.24)
Chloride: 104 mmol/L (ref 101–111)
GFR calc non Af Amer: 44 mL/min — ABNORMAL LOW (ref >60)
GFR, EST AFRICAN AMERICAN: 52 mL/min — AB (ref >60)
Glucose, Bld: 158 mg/dL — ABNORMAL HIGH (ref 65–99)
Potassium: 4.6 mmol/L (ref 3.5–5.1)
SODIUM: 138 mmol/L (ref 135–145)

## 2016-05-09 LAB — SURGICAL PCR SCREEN
MRSA, PCR: NEGATIVE
Staphylococcus aureus: NEGATIVE

## 2016-05-09 LAB — GLUCOSE, CAPILLARY: GLUCOSE-CAPILLARY: 171 mg/dL — AB (ref 65–99)

## 2016-05-09 NOTE — Progress Notes (Signed)
Peri-operative Device orders faxed to [facility].  Tomi Bamberger ([employer]) Rep called to inform of pt. Date and time of surgery,she stated to call her back when we had received the completed Peri-Operative order sheet.  Phone # [phone]

## 2016-05-12 NOTE — Progress Notes (Signed)
Peri-operative device orders faxed to [facility] for 2nd time.

## 2016-05-15 DIAGNOSIS — E118 Type 2 diabetes mellitus with unspecified complications: Secondary | ICD-10-CM | POA: Diagnosis not present

## 2016-05-15 DIAGNOSIS — E1165 Type 2 diabetes mellitus with hyperglycemia: Secondary | ICD-10-CM | POA: Diagnosis not present

## 2016-05-15 DIAGNOSIS — I10 Essential (primary) hypertension: Secondary | ICD-10-CM | POA: Diagnosis not present

## 2016-05-15 DIAGNOSIS — I480 Paroxysmal atrial fibrillation: Secondary | ICD-10-CM | POA: Diagnosis not present

## 2016-05-15 NOTE — Progress Notes (Signed)
Tomi Bamberger( from [employer])called and informed of orders and of date and time of surgery.

## 2016-05-17 MED ORDER — CEFAZOLIN SODIUM-DEXTROSE 2-4 GM/100ML-% IV SOLN
2.0000 g | INTRAVENOUS | Status: AC
  Administered 2016-05-18: 2 g via INTRAVENOUS
  Filled 2016-05-17: qty 100

## 2016-05-17 MED ORDER — DEXAMETHASONE SODIUM PHOSPHATE 10 MG/ML IJ SOLN
10.0000 mg | INTRAMUSCULAR | Status: AC
  Administered 2016-05-18: 10 mg via INTRAVENOUS
  Filled 2016-05-17: qty 1

## 2016-05-18 ENCOUNTER — Inpatient Hospital Stay (HOSPITAL_COMMUNITY)
Admission: RE | Admit: 2016-05-18 | Discharge: 2016-05-19 | DRG: 460 | Disposition: A | Discharge status: Home / Self Care | Payer: PPO | Source: Ambulatory Visit | Attending: Neurological Surgery | Admitting: Neurological Surgery

## 2016-05-18 ENCOUNTER — Inpatient Hospital Stay (HOSPITAL_COMMUNITY): Payer: PPO | Admitting: Certified Registered Nurse Anesthetist

## 2016-05-18 ENCOUNTER — Encounter (HOSPITAL_COMMUNITY)
Admission: RE | Disposition: A | Discharge status: Home / Self Care | Payer: Self-pay | Source: Ambulatory Visit | Attending: Neurological Surgery

## 2016-05-18 ENCOUNTER — Encounter (HOSPITAL_COMMUNITY): Payer: Self-pay | Admitting: Neurological Surgery

## 2016-05-18 ENCOUNTER — Inpatient Hospital Stay (HOSPITAL_COMMUNITY): Payer: PPO

## 2016-05-18 DIAGNOSIS — Z7984 Long term (current) use of oral hypoglycemic drugs: Secondary | ICD-10-CM | POA: Diagnosis not present

## 2016-05-18 DIAGNOSIS — Y793 Surgical instruments, materials and orthopedic devices (including sutures) associated with adverse incidents: Secondary | ICD-10-CM | POA: Diagnosis not present

## 2016-05-18 DIAGNOSIS — E119 Type 2 diabetes mellitus without complications: Secondary | ICD-10-CM | POA: Diagnosis present

## 2016-05-18 DIAGNOSIS — Z7901 Long term (current) use of anticoagulants: Secondary | ICD-10-CM | POA: Diagnosis not present

## 2016-05-18 DIAGNOSIS — M960 Pseudarthrosis after fusion or arthrodesis: Secondary | ICD-10-CM | POA: Diagnosis not present

## 2016-05-18 DIAGNOSIS — Z809 Family history of malignant neoplasm, unspecified: Secondary | ICD-10-CM

## 2016-05-18 DIAGNOSIS — Y838 Other surgical procedures as the cause of abnormal reaction of the patient, or of later complication, without mention of misadventure at the time of the procedure: Secondary | ICD-10-CM | POA: Diagnosis not present

## 2016-05-18 DIAGNOSIS — M48061 Spinal stenosis, lumbar region without neurogenic claudication: Secondary | ICD-10-CM | POA: Diagnosis not present

## 2016-05-18 DIAGNOSIS — Z950 Presence of cardiac pacemaker: Secondary | ICD-10-CM

## 2016-05-18 DIAGNOSIS — I2510 Atherosclerotic heart disease of native coronary artery without angina pectoris: Secondary | ICD-10-CM | POA: Diagnosis not present

## 2016-05-18 DIAGNOSIS — Z7982 Long term (current) use of aspirin: Secondary | ICD-10-CM

## 2016-05-18 DIAGNOSIS — T84218A Breakdown (mechanical) of internal fixation device of other bones, initial encounter: Secondary | ICD-10-CM | POA: Diagnosis not present

## 2016-05-18 DIAGNOSIS — Z981 Arthrodesis status: Secondary | ICD-10-CM

## 2016-05-18 DIAGNOSIS — Z96652 Presence of left artificial knee joint: Secondary | ICD-10-CM | POA: Diagnosis not present

## 2016-05-18 DIAGNOSIS — Z951 Presence of aortocoronary bypass graft: Secondary | ICD-10-CM | POA: Diagnosis not present

## 2016-05-18 DIAGNOSIS — G4730 Sleep apnea, unspecified: Secondary | ICD-10-CM | POA: Diagnosis present

## 2016-05-18 DIAGNOSIS — Z419 Encounter for procedure for purposes other than remedying health state, unspecified: Secondary | ICD-10-CM

## 2016-05-18 DIAGNOSIS — I4891 Unspecified atrial fibrillation: Secondary | ICD-10-CM | POA: Diagnosis not present

## 2016-05-18 DIAGNOSIS — I10 Essential (primary) hypertension: Secondary | ICD-10-CM | POA: Diagnosis not present

## 2016-05-18 DIAGNOSIS — M549 Dorsalgia, unspecified: Secondary | ICD-10-CM | POA: Diagnosis not present

## 2016-05-18 DIAGNOSIS — Z472 Encounter for removal of internal fixation device: Secondary | ICD-10-CM | POA: Diagnosis not present

## 2016-05-18 DIAGNOSIS — I480 Paroxysmal atrial fibrillation: Secondary | ICD-10-CM | POA: Diagnosis not present

## 2016-05-18 HISTORY — PX: LUMBAR LAMINECTOMY/DECOMPRESSION MICRODISCECTOMY: SHX5026

## 2016-05-18 HISTORY — PX: HARDWARE REMOVAL: SHX979

## 2016-05-18 LAB — HEMOGLOBIN A1C
HEMOGLOBIN A1C: 6.8 % — AB (ref 4.8–5.6)
Mean Plasma Glucose: 148 mg/dL

## 2016-05-18 LAB — GLUCOSE, CAPILLARY
Glucose-Capillary: 76 mg/dL (ref 65–99)
Glucose-Capillary: 176 mg/dL — ABNORMAL HIGH (ref 65–99)
Glucose-Capillary: 297 mg/dL — ABNORMAL HIGH (ref 65–99)

## 2016-05-18 SURGERY — LUMBAR LAMINECTOMY/DECOMPRESSION MICRODISCECTOMY 2 LEVELS
Anesthesia: General | Site: Back

## 2016-05-18 MED ORDER — MIDAZOLAM HCL 2 MG/2ML IJ SOLN
INTRAMUSCULAR | Status: AC
  Filled 2016-05-18: qty 2

## 2016-05-18 MED ORDER — OXYCODONE HCL 5 MG PO TABS
5.0000 mg | ORAL_TABLET | ORAL | Status: DC | PRN
  Administered 2016-05-18: 10 mg via ORAL
  Filled 2016-05-18: qty 2

## 2016-05-18 MED ORDER — LISINOPRIL-HYDROCHLOROTHIAZIDE 20-25 MG PO TABS: 1.0000 | ORAL_TABLET | Freq: Every day | ORAL | Status: DC

## 2016-05-18 MED ORDER — FENTANYL CITRATE (PF) 100 MCG/2ML IJ SOLN
INTRAMUSCULAR | Status: DC | PRN
  Administered 2016-05-18: 50 ug via INTRAVENOUS
  Administered 2016-05-18: 100 ug via INTRAVENOUS
  Administered 2016-05-18 (×2): 50 ug via INTRAVENOUS

## 2016-05-18 MED ORDER — HYDROXYZINE HCL 50 MG/ML IM SOLN
50.0000 mg | INTRAMUSCULAR | Status: DC | PRN
  Administered 2016-05-18: 50 mg via INTRAMUSCULAR
  Filled 2016-05-18: qty 1

## 2016-05-18 MED ORDER — ONDANSETRON HCL 4 MG/2ML IJ SOLN
INTRAMUSCULAR | Status: DC | PRN
  Administered 2016-05-18: 4 mg via INTRAVENOUS

## 2016-05-18 MED ORDER — POTASSIUM CHLORIDE IN NACL 20-0.9 MEQ/L-% IV SOLN: INTRAVENOUS | Status: DC

## 2016-05-18 MED ORDER — LIDOCAINE HCL (CARDIAC) 20 MG/ML IV SOLN
INTRAVENOUS | Status: DC | PRN
  Administered 2016-05-18: 60 mg via INTRAVENOUS

## 2016-05-18 MED ORDER — HYDROMORPHONE HCL 1 MG/ML IJ SOLN
INTRAMUSCULAR | Status: AC
  Administered 2016-05-18: 0.5 mg via INTRAVENOUS
  Filled 2016-05-18: qty 0.5

## 2016-05-18 MED ORDER — PROPOFOL 10 MG/ML IV BOLUS
INTRAVENOUS | Status: DC | PRN
  Administered 2016-05-18: 140 mg via INTRAVENOUS

## 2016-05-18 MED ORDER — HYDROMORPHONE HCL 1 MG/ML IJ SOLN
0.2500 mg | INTRAMUSCULAR | Status: DC | PRN
  Administered 2016-05-18 (×3): 0.5 mg via INTRAVENOUS

## 2016-05-18 MED ORDER — OXYCODONE HCL 5 MG/5ML PO SOLN: 5.0000 mg | Freq: Once | ORAL | Status: DC | PRN

## 2016-05-18 MED ORDER — SODIUM CHLORIDE 0.9 % IR SOLN
Status: DC | PRN
  Administered 2016-05-18: 11:00:00

## 2016-05-18 MED ORDER — SODIUM CHLORIDE 0.9% FLUSH
3.0000 mL | INTRAVENOUS | Status: DC | PRN
Start: 2016-05-18 — End: 2016-05-19

## 2016-05-18 MED ORDER — SUGAMMADEX SODIUM 200 MG/2ML IV SOLN
INTRAVENOUS | Status: AC
  Filled 2016-05-18: qty 2

## 2016-05-18 MED ORDER — METHOCARBAMOL 500 MG PO TABS
500.0000 mg | ORAL_TABLET | Freq: Four times a day (QID) | ORAL | Status: DC | PRN
  Administered 2016-05-19: 500 mg via ORAL
  Filled 2016-05-18: qty 1

## 2016-05-18 MED ORDER — SENNA 8.6 MG PO TABS
1.0000 | ORAL_TABLET | Freq: Two times a day (BID) | ORAL | Status: DC
  Administered 2016-05-18 – 2016-05-19 (×2): 8.6 mg via ORAL
  Filled 2016-05-18 (×2): qty 1

## 2016-05-18 MED ORDER — LACTATED RINGERS IV SOLN
INTRAVENOUS | Status: DC
  Administered 2016-05-18 (×2): via INTRAVENOUS

## 2016-05-18 MED ORDER — GLIPIZIDE 10 MG PO TABS
10.0000 mg | ORAL_TABLET | Freq: Two times a day (BID) | ORAL | Status: DC
  Administered 2016-05-19: 10 mg via ORAL
  Filled 2016-05-18: qty 1

## 2016-05-18 MED ORDER — ROCURONIUM BROMIDE 50 MG/5ML IV SOSY
PREFILLED_SYRINGE | INTRAVENOUS | Status: AC
  Filled 2016-05-18: qty 5

## 2016-05-18 MED ORDER — 0.9 % SODIUM CHLORIDE (POUR BTL) OPTIME
TOPICAL | Status: DC | PRN
  Administered 2016-05-18: 1000 mL

## 2016-05-18 MED ORDER — SUGAMMADEX SODIUM 200 MG/2ML IV SOLN
INTRAVENOUS | Status: DC | PRN
  Administered 2016-05-18: 200 mg via INTRAVENOUS

## 2016-05-18 MED ORDER — ONDANSETRON HCL 4 MG PO TABS
4.0000 mg | ORAL_TABLET | Freq: Four times a day (QID) | ORAL | Status: DC | PRN
Start: 2016-05-18 — End: 2016-05-19

## 2016-05-18 MED ORDER — ALLOPURINOL 100 MG PO TABS
100.0000 mg | ORAL_TABLET | Freq: Two times a day (BID) | ORAL | Status: DC
Start: 2016-05-18 — End: 2016-05-19
  Administered 2016-05-18 – 2016-05-19 (×2): 100 mg via ORAL
  Filled 2016-05-18 (×2): qty 1

## 2016-05-18 MED ORDER — THROMBIN 5000 UNITS EX SOLR
OROMUCOSAL | Status: DC | PRN
  Administered 2016-05-18: 12:00:00 via TOPICAL

## 2016-05-18 MED ORDER — SODIUM CHLORIDE 0.9% FLUSH
3.0000 mL | Freq: Two times a day (BID) | INTRAVENOUS | Status: DC
  Administered 2016-05-18: 3 mL via INTRAVENOUS

## 2016-05-18 MED ORDER — ONDANSETRON HCL 4 MG/2ML IJ SOLN
4.0000 mg | Freq: Four times a day (QID) | INTRAMUSCULAR | Status: DC | PRN
  Filled 2016-05-18: qty 2

## 2016-05-18 MED ORDER — PHENYLEPHRINE HCL 10 MG/ML IJ SOLN
INTRAVENOUS | Status: DC | PRN
  Administered 2016-05-18: 25 ug/min via INTRAVENOUS

## 2016-05-18 MED ORDER — BUPIVACAINE HCL (PF) 0.25 % IJ SOLN
INTRAMUSCULAR | Status: AC
  Filled 2016-05-18: qty 30

## 2016-05-18 MED ORDER — LISINOPRIL 20 MG PO TABS
20.0000 mg | ORAL_TABLET | Freq: Every day | ORAL | Status: DC
  Administered 2016-05-19: 20 mg via ORAL
  Filled 2016-05-18: qty 1

## 2016-05-18 MED ORDER — MORPHINE SULFATE (PF) 4 MG/ML IV SOLN
2.0000 mg | INTRAVENOUS | Status: DC | PRN
  Administered 2016-05-18: 2 mg via INTRAVENOUS
  Filled 2016-05-18: qty 1

## 2016-05-18 MED ORDER — METFORMIN HCL 500 MG PO TABS
500.0000 mg | ORAL_TABLET | Freq: Two times a day (BID) | ORAL | Status: DC
  Administered 2016-05-19: 500 mg via ORAL
  Filled 2016-05-18: qty 1

## 2016-05-18 MED ORDER — CARVEDILOL 6.25 MG PO TABS
25.0000 mg | ORAL_TABLET | Freq: Two times a day (BID) | ORAL | Status: DC
  Administered 2016-05-18 – 2016-05-19 (×2): 25 mg via ORAL
  Filled 2016-05-18 (×2): qty 4

## 2016-05-18 MED ORDER — HEMOSTATIC AGENTS (NO CHARGE) OPTIME
TOPICAL | Status: DC | PRN
  Administered 2016-05-18: 1 via TOPICAL

## 2016-05-18 MED ORDER — ARTIFICIAL TEARS OP OINT
TOPICAL_OINTMENT | OPHTHALMIC | Status: DC | PRN
  Administered 2016-05-18: 1 via OPHTHALMIC

## 2016-05-18 MED ORDER — CEFAZOLIN SODIUM-DEXTROSE 2-4 GM/100ML-% IV SOLN
2.0000 g | Freq: Three times a day (TID) | INTRAVENOUS | Status: AC
  Administered 2016-05-18 – 2016-05-19 (×2): 2 g via INTRAVENOUS
  Filled 2016-05-18 (×2): qty 100

## 2016-05-18 MED ORDER — ROCURONIUM BROMIDE 100 MG/10ML IV SOLN
INTRAVENOUS | Status: DC | PRN
  Administered 2016-05-18: 50 mg via INTRAVENOUS

## 2016-05-18 MED ORDER — THROMBIN 5000 UNITS EX SOLR
CUTANEOUS | Status: DC | PRN
  Administered 2016-05-18 (×2): 5000 [IU] via TOPICAL

## 2016-05-18 MED ORDER — LIDOCAINE 2% (20 MG/ML) 5 ML SYRINGE
INTRAMUSCULAR | Status: AC
  Filled 2016-05-18: qty 5

## 2016-05-18 MED ORDER — CELECOXIB 200 MG PO CAPS
200.0000 mg | ORAL_CAPSULE | Freq: Two times a day (BID) | ORAL | Status: DC
  Administered 2016-05-18 – 2016-05-19 (×2): 200 mg via ORAL
  Filled 2016-05-18 (×2): qty 1

## 2016-05-18 MED ORDER — CHLORHEXIDINE GLUCONATE CLOTH 2 % EX PADS: 6.0000 | MEDICATED_PAD | Freq: Once | CUTANEOUS | Status: DC

## 2016-05-18 MED ORDER — METHOCARBAMOL 1000 MG/10ML IJ SOLN
500.0000 mg | Freq: Four times a day (QID) | INTRAVENOUS | Status: DC | PRN
  Filled 2016-05-18: qty 5

## 2016-05-18 MED ORDER — HYDROMORPHONE HCL 2 MG PO TABS
2.0000 mg | ORAL_TABLET | ORAL | Status: DC | PRN
  Administered 2016-05-18: 2 mg via ORAL
  Administered 2016-05-19: 4 mg via ORAL
  Filled 2016-05-18: qty 1
  Filled 2016-05-18: qty 2

## 2016-05-18 MED ORDER — THROMBIN 5000 UNITS EX SOLR
CUTANEOUS | Status: AC
  Filled 2016-05-18: qty 15000

## 2016-05-18 MED ORDER — ACETAMINOPHEN 650 MG RE SUPP
650.0000 mg | RECTAL | Status: DC | PRN
Start: 2016-05-18 — End: 2016-05-19

## 2016-05-18 MED ORDER — BUPIVACAINE HCL (PF) 0.25 % IJ SOLN
INTRAMUSCULAR | Status: DC | PRN
  Administered 2016-05-18: 4 mL

## 2016-05-18 MED ORDER — HYDROCHLOROTHIAZIDE 25 MG PO TABS
25.0000 mg | ORAL_TABLET | Freq: Every day | ORAL | Status: DC
  Administered 2016-05-19: 25 mg via ORAL
  Filled 2016-05-18: qty 1

## 2016-05-18 MED ORDER — MIDAZOLAM HCL 5 MG/5ML IJ SOLN
INTRAMUSCULAR | Status: DC | PRN
  Administered 2016-05-18: 1 mg via INTRAVENOUS

## 2016-05-18 MED ORDER — AMLODIPINE BESYLATE 5 MG PO TABS
5.0000 mg | ORAL_TABLET | Freq: Every day | ORAL | Status: DC
  Administered 2016-05-19: 5 mg via ORAL
  Filled 2016-05-18: qty 1

## 2016-05-18 MED ORDER — FENTANYL CITRATE (PF) 250 MCG/5ML IJ SOLN
INTRAMUSCULAR | Status: AC
  Filled 2016-05-18: qty 5

## 2016-05-18 MED ORDER — COLCHICINE 0.6 MG PO TABS
0.6000 mg | ORAL_TABLET | Freq: Two times a day (BID) | ORAL | Status: DC
  Administered 2016-05-18 – 2016-05-19 (×2): 0.6 mg via ORAL
  Filled 2016-05-18 (×2): qty 1

## 2016-05-18 MED ORDER — OXYCODONE HCL 5 MG PO TABS: 5.0000 mg | ORAL_TABLET | Freq: Once | ORAL | Status: DC | PRN

## 2016-05-18 MED ORDER — ONDANSETRON HCL 4 MG/2ML IJ SOLN
INTRAMUSCULAR | Status: AC
  Filled 2016-05-18: qty 2

## 2016-05-18 MED ORDER — ARTIFICIAL TEARS OP OINT
TOPICAL_OINTMENT | OPHTHALMIC | Status: AC
  Filled 2016-05-18: qty 3.5

## 2016-05-18 MED ORDER — MENTHOL 3 MG MT LOZG: 1.0000 | LOZENGE | OROMUCOSAL | Status: DC | PRN

## 2016-05-18 MED ORDER — OXYCODONE HCL 5 MG PO TABS
ORAL_TABLET | ORAL | Status: AC
  Administered 2016-05-18: 10 mg via ORAL
  Filled 2016-05-18: qty 2

## 2016-05-18 MED ORDER — ACETAMINOPHEN 325 MG PO TABS: 650.0000 mg | ORAL_TABLET | ORAL | Status: DC | PRN

## 2016-05-18 MED ORDER — INSULIN ASPART 100 UNIT/ML ~~LOC~~ SOLN
0.0000 [IU] | Freq: Three times a day (TID) | SUBCUTANEOUS | Status: DC
  Administered 2016-05-18: 3 [IU] via SUBCUTANEOUS
  Administered 2016-05-19: 5 [IU] via SUBCUTANEOUS

## 2016-05-18 MED ORDER — PHENOL 1.4 % MT LIQD: 1.0000 | OROMUCOSAL | Status: DC | PRN

## 2016-05-18 SURGICAL SUPPLY — 45 items
BAG DECANTER FOR FLEXI CONT (MISCELLANEOUS) ×3 IMPLANT
BASKET BONE COLLECTION (BASKET) ×3 IMPLANT
BENZOIN TINCTURE PRP APPL 2/3 (GAUZE/BANDAGES/DRESSINGS) ×3 IMPLANT
BUR MATCHSTICK NEURO 3.0 LAGG (BURR) ×3 IMPLANT
CANISTER SUCT 3000ML PPV (MISCELLANEOUS) ×6 IMPLANT
CARTRIDGE OIL MAESTRO DRILL (MISCELLANEOUS) ×2 IMPLANT
DIFFUSER DRILL AIR PNEUMATIC (MISCELLANEOUS) ×3 IMPLANT
DRAPE LAPAROTOMY 100X72X124 (DRAPES) ×3 IMPLANT
DRAPE MICROSCOPE LEICA (MISCELLANEOUS) ×3 IMPLANT
DRAPE POUCH INSTRU U-SHP 10X18 (DRAPES) ×3 IMPLANT
DRAPE SURG 17X23 STRL (DRAPES) ×3 IMPLANT
DRSG OPSITE POSTOP 4X6 (GAUZE/BANDAGES/DRESSINGS) ×3 IMPLANT
DURAPREP 26ML APPLICATOR (WOUND CARE) ×3 IMPLANT
ELECT REM PT RETURN 9FT ADLT (ELECTROSURGICAL) ×3
ELECTRODE REM PT RTRN 9FT ADLT (ELECTROSURGICAL) ×2 IMPLANT
GAUZE SPONGE 4X4 16PLY XRAY LF (GAUZE/BANDAGES/DRESSINGS) IMPLANT
GLOVE BIO SURGEON STRL SZ7 (GLOVE) IMPLANT
GLOVE BIO SURGEON STRL SZ8 (GLOVE) ×6 IMPLANT
GLOVE BIOGEL PI IND STRL 7.0 (GLOVE) IMPLANT
GLOVE BIOGEL PI INDICATOR 7.0 (GLOVE)
GOWN STRL REUS W/ TWL LRG LVL3 (GOWN DISPOSABLE) ×2 IMPLANT
GOWN STRL REUS W/ TWL XL LVL3 (GOWN DISPOSABLE) ×4 IMPLANT
GOWN STRL REUS W/TWL 2XL LVL3 (GOWN DISPOSABLE) IMPLANT
GOWN STRL REUS W/TWL LRG LVL3 (GOWN DISPOSABLE) ×1
GOWN STRL REUS W/TWL XL LVL3 (GOWN DISPOSABLE) ×2
HEMOSTAT POWDER KIT SURGIFOAM (HEMOSTASIS) ×3 IMPLANT
KIT BASIN OR (CUSTOM PROCEDURE TRAY) ×3 IMPLANT
KIT ROOM TURNOVER OR (KITS) ×3 IMPLANT
NEEDLE HYPO 25X1 1.5 SAFETY (NEEDLE) ×3 IMPLANT
NEEDLE SPNL 20GX3.5 QUINCKE YW (NEEDLE) ×3 IMPLANT
NS IRRIG 1000ML POUR BTL (IV SOLUTION) ×3 IMPLANT
OIL CARTRIDGE MAESTRO DRILL (MISCELLANEOUS) ×3
PACK LAMINECTOMY NEURO (CUSTOM PROCEDURE TRAY) ×3 IMPLANT
PAD ARMBOARD 7.5X6 YLW CONV (MISCELLANEOUS) ×18 IMPLANT
PUTTY BONE ATTRAX 5CC STRIP (Putty) ×3 IMPLANT
RUBBERBAND STERILE (MISCELLANEOUS) ×6 IMPLANT
SPONGE SURGIFOAM ABS GEL SZ50 (HEMOSTASIS) ×3 IMPLANT
STRIP CLOSURE SKIN 1/2X4 (GAUZE/BANDAGES/DRESSINGS) ×3 IMPLANT
SUT VIC AB 0 CT1 18XCR BRD8 (SUTURE) ×2 IMPLANT
SUT VIC AB 0 CT1 8-18 (SUTURE) ×1
SUT VIC AB 2-0 CP2 18 (SUTURE) ×3 IMPLANT
SUT VIC AB 3-0 SH 8-18 (SUTURE) ×6 IMPLANT
TOWEL GREEN STERILE (TOWEL DISPOSABLE) ×3 IMPLANT
TOWEL GREEN STERILE FF (TOWEL DISPOSABLE) ×3 IMPLANT
WATER STERILE IRR 1000ML POUR (IV SOLUTION) ×3 IMPLANT

## 2016-05-18 NOTE — Anesthesia Procedure Notes (Signed)
Procedure Name: Intubation Date/Time: 05/18/2016 11:08 AM Performed by: Candis Shine Pre-anesthesia Checklist: Patient identified, Emergency Drugs available, Suction available and Patient being monitored Patient Re-evaluated:Patient Re-evaluated prior to inductionOxygen Delivery Method: Circle System Utilized Preoxygenation: Pre-oxygenation with 100% oxygen Intubation Type: IV induction Ventilation: Mask ventilation without difficulty Laryngoscope Size: Mac and 4 Grade View: Grade II Tube type: Oral Tube size: 7.5 mm Number of attempts: 1 Airway Equipment and Method: Stylet and Oral airway Placement Confirmation: ETT inserted through vocal cords under direct vision,  positive ETCO2 and breath sounds checked- equal and bilateral Secured at: 23 cm Tube secured with: Tape Dental Injury: Teeth and Oropharynx as per pre-operative assessment

## 2016-05-18 NOTE — Transfer of Care (Signed)
Immediate Anesthesia Transfer of Care Note  Patient: Alan [name]  Procedure(s) Performed: Procedure(s): Laminectomy and Foraminotomy - Lumbar two-lumbar three- Lumbar four-lumbar five- left with removal hardware lumbar three-four (Left) Removal of broken hardware Lumbar three-four (N/A)  Patient Location: PACU  Anesthesia Type:General  Level of Consciousness: awake, alert  and oriented  Airway & Oxygen Therapy: Patient Spontanous Breathing and Patient connected to nasal cannula oxygen  Post-op Assessment: Report given to RN and Post -op Vital signs reviewed and stable  Post vital signs: Reviewed and stable  Last Vitals:  Vitals:   05/18/16 0807  BP: 140/63  Pulse: 64  Resp: 18  Temp: 36.4 C    Last Pain:  Vitals:   05/18/16 0807  TempSrc: Oral         Complications: No apparent anesthesia complications

## 2016-05-18 NOTE — H&P (Signed)
Subjective: Patient is a 76 y.o. male admitted for back and leg pain. Onset of symptoms was several months ago, gradually worsening since that time.  The pain is rated severe, and is located at the across the lower back and radiates to LLE. The pain is described as aching and occurs all day. The symptoms have been progressive. Symptoms are exacerbated by exercise. MRI or CT showed stenosis and broken hardware   Past Medical History:  Diagnosis Date  . A-fib ([place])   . Anticoagulant long-term use, on coumadin for PAF 03/08/2012  . Chronic back pain   . Complete heart block ([place])   . Coronary artery disease    seen at [facility] cardiology   . Diabetes mellitus   . Hx of echocardiogram 06/29/2008   EF 45-50%   . Hypertension    Dr. Veronia Beets, primary  . Neuromuscular disorder ([place])    carpal tunnel bilaterally  . 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 Polio    hx of  . Sleep apnea    cpap, 11, last sleep study Jan2013, sees Dr. Keturah Barre  . Status post placement of cardiac pacemaker, 03/07/12 New Medtronic Gen and new Vent lead original placed 2008 03/08/2012    Past Surgical History:  Procedure Laterality Date  . APPENDECTOMY    . BACK SURGERY    . CARDIAC CATHETERIZATION    . CORONARY ARTERY BYPASS GRAFT  1990  . CORONARY ARTERY BYPASS GRAFT  2004  . ESOPHAGOGASTRODUODENOSCOPY Left 03/18/2013   Procedure: ESOPHAGOGASTRODUODENOSCOPY (EGD);  Surgeon: Cleotis Nipper, MD;  Location: [facility] ENDOSCOPY;  Service: Endoscopy;  Laterality: Left;  . FRACTURE SURGERY     Hx: of right leg  . HERNIA REPAIR    . INSERT / REPLACE / REMOVE PACEMAKER  03/07/2012   replaced  . JOINT REPLACEMENT     Hx: of left knee  . LEFT HEART CATHETERIZATION WITH CORONARY/GRAFT ANGIOGRAM N/A 12/21/2010   Procedure: LEFT HEART CATHETERIZATION WITH Beatrix Fetters;  Surgeon: Leonie Man, MD;  Location: [facility] CATH LAB;  Service: Cardiovascular;  Laterality: N/A;  . LUMBAR LAMINECTOMY/DECOMPRESSION  MICRODISCECTOMY  01/10/2012   Procedure: LUMBAR LAMINECTOMY/DECOMPRESSION MICRODISCECTOMY 1 LEVEL;  Surgeon: [name], MD;  Location: [place] NEURO ORS;  Service: Neurosurgery;  Laterality: Bilateral;  Lumbar three-four decompression, Posterior lateral fusion lumbar three-four, posterior spinus plate lumbar three-four  . MAZE Procedure  2004  . NASAL SEPTUM SURGERY N/A 14  . PACEMAKER INSERTION  08, 1/14  . PACEMAKER REVISION N/A 03/07/2012   Procedure: PACEMAKER REVISION;  Surgeon: Sanda Klein, MD;  Location: [place] CATH LAB;  Service: Cardiovascular;  Laterality: N/A;  . POSTERIOR FUSION LUMBAR SPINE  08/28/2012   Dr Ronnald Ramp  . SHOULDER ARTHROSCOPY WITH OPEN ROTATOR CUFF REPAIR Right 03/30/2014   Procedure: RIGHT SHOULDER ARTHROSCOPY  OPEN ROTATOR CUFF REPAIR;  Surgeon: Kerin Salen, MD;  Location: [place];  Service: Orthopedics;  Laterality: Right;  . SHOULDER OPEN ROTATOR CUFF REPAIR Right 03/30/2014   Procedure: ROTATOR CUFF REPAIR SHOULDER OPEN;  Surgeon: Kerin Salen, MD;  Location: [place];  Service: Orthopedics;  Laterality: Right;  . STENTS    . TONSILLECTOMY      Prior to Admission medications   Medication Sig Start Date End Date Taking? Authorizing Provider  acetaminophen (TYLENOL) 500 MG tablet Take 500 mg by mouth 2 (two) times daily as needed (for aches/pain.).   Yes Historical Provider, MD  allopurinol (ZYLOPRIM) 100 MG tablet Take 1 tablet (100 mg total)  by mouth 2 (two) times daily. 05/01/16  Yes Newt Minion, MD  amLODipine (NORVASC) 5 MG tablet Take 1 tablet (5 mg total) by mouth daily. 03/16/16 06/14/16 Yes Mihai [name], MD  aspirin EC 81 MG tablet Take 81 mg by mouth daily.   Yes Historical Provider, MD  carvedilol (COREG) 25 MG tablet TAKE TWO TABLETS BY MOUTH TWICE DAILY Patient taking differently: TAKE TWO TABLETS (50 MG) BY MOUTH TWICE DAILY 10/04/15  Yes Lelon Perla, MD  colchicine 0.6 MG tablet Take 1 tablet (0.6 mg total) by mouth 2 (two) times daily as needed. for gout  flareup Patient taking differently: Take 0.6 mg by mouth 2 (two) times daily. for gout flareup 05/01/16  Yes Newt Minion, MD  Cyanocobalamin (VITAMIN B-12 PO) Take 5,000 mcg by mouth daily.    Yes Historical Provider, MD  diclofenac sodium (VOLTAREN) 1 % GEL Apply 1 g topically 2 (two) times daily as needed (for back pain/aches or pain.).  03/20/16  Yes Historical Provider, MD  glipiZIDE (GLUCOTROL) 10 MG tablet Take 10 mg by mouth 2 (two) times daily. For high blood sugars   Yes Historical Provider, MD  lisinopril-hydrochlorothiazide (PRINZIDE,ZESTORETIC) 20-25 MG tablet TAKE ONE TABLET BY MOUTH ONCE DAILY Patient taking differently: TAKE ONE TABLET BY MOUTH ONCE DAILY IN THE MORNING 01/07/16  Yes Mihai [name], MD  metFORMIN (GLUCOPHAGE) 500 MG tablet Take 500 mg by mouth 4 (four) times daily.  08/24/14  Yes Historical Provider, MD   Allergies  Allergen Reactions  . No Known Allergies     Social History  Substance Use Topics  . Smoking status: Never Smoker  . Smokeless tobacco: Never Used  . Alcohol use Yes     Comment: rarely    Family History  Problem Relation Age of Onset  . Other Father     MVA  . Cancer Mother      Review of Systems  Positive ROS: neg  All other systems have been reviewed and were otherwise negative with the exception of those mentioned in the HPI and as above.  Objective: Vital signs in last 24 hours:    General Appearance: Alert, cooperative, no distress, appears stated age Head: Normocephalic, without obvious abnormality, atraumatic Eyes: PERRL, conjunctiva/corneas clear, EOM's intact    Neck: Supple, symmetrical, trachea midline Back: Symmetric, no curvature, ROM normal, no CVA tenderness Lungs:  respirations unlabored Heart: Regular rate and rhythm Abdomen: Soft, non-tender Extremities: Extremities normal, atraumatic, no cyanosis or edema Pulses: 2+ and symmetric all extremities Skin: Skin color, texture, turgor normal, no rashes or  lesions  NEUROLOGIC:   Mental status: Alert and oriented x4,  no aphasia, good attention span, fund of knowledge, and memory Motor Exam - grossly normal Sensory Exam - grossly normal Reflexes: 1+ Coordination - grossly normal Gait - grossly normal Balance - grossly normal Cranial Nerves: I: smell Not tested  II: visual acuity  OS: nl    OD: nl  II: visual fields Full to confrontation  II: pupils Equal, round, reactive to light  III,VII: ptosis None  III,IV,VI: extraocular muscles  Full ROM  V: mastication Normal  V: facial light touch sensation  Normal  V,VII: corneal reflex  Present  VII: facial muscle function - upper  Normal  VII: facial muscle function - lower Normal  VIII: hearing Not tested  IX: soft palate elevation  Normal  IX,X: gag reflex Present  XI: trapezius strength  5/5  XI: sternocleidomastoid strength 5/5  XI: neck flexion  strength  5/5  XII: tongue strength  Normal    Data Review Lab Results  Component Value Date   WBC 5.6 05/09/2016   HGB 12.1 (L) 05/09/2016   HCT 36.2 (L) 05/09/2016   MCV 92.6 05/09/2016   PLT 189 05/09/2016   Lab Results  Component Value Date   NA 138 05/09/2016   K 4.6 05/09/2016   CL 104 05/09/2016   CO2 23 05/09/2016   BUN 30 (H) 05/09/2016   CREATININE 1.49 (H) 05/09/2016   GLUCOSE 158 (H) 05/09/2016   Lab Results  Component Value Date   INR 1.07 05/09/2016    Assessment/Plan: Patient admitted for removal of broken hardware and decompression of adjacent level stenosis. Patient has failed a reasonable attempt at conservative therapy.  I explained the condition and procedure to the patient and answered any questions.  Patient wishes to proceed with procedure as planned. Understands risks/ benefits and typical outcomes of procedure.   JONES,DAVID S 05/18/2016 6:48 AM

## 2016-05-18 NOTE — Op Note (Signed)
05/18/2016  1:39 PM  PATIENT:  Alan [name]  76 y.o. 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 Lumbar exploration to rule out pseudoarthrosis L3-4 with removal of nonsegmental posterior instrumentation L3-4, 2. Posterior lateral arthrodesis L2-3 and L3-4 on the right utilizing locally harvested morselized autologous bone graft mixed with morselized allograft, 3. Decompressive lumbar hemilaminectomy and medial facetectomy and foraminotomy L2-3 and L4-5 on the left  SURGEON:  Sherley Bounds, MD  ASSISTANTS: Dr Saintclair Halsted  ANESTHESIA:   General  EBL: 50 ml  Total I/O In: 1000 [I.V.:1000] Out: 50 [Blood:50]  BLOOD ADMINISTERED: none  DRAINS: none  SPECIMEN:  none  INDICATION FOR PROCEDURE: This patient presented with back and left leg pain. Imaging showed broken pedicle screws at L4 after previous L3-4 fusion. His would be suggestive of pseudoarthrosis. He also had adjacent stenosis at L2-3 and at L4-5. The patient tried conservative measures without relief. Pain was debilitating. Recommended decompressive hemilaminectomies at L2-3 and L4-5 with removal of nonsegmental fixation L3-4 with exploration of fusion and possible redo posterior lateral fusion. Patient understood the risks, benefits, and alternatives and potential outcomes and wished to proceed.  PROCEDURE DETAILS: The patient was taken to the operating room and after induction of adequate generalized endotracheal anesthesia, the patient was rolled into the prone position on the Wilson frame and all pressure points were padded. The lumbar region was cleaned and then prepped with DuraPrep and draped in the usual sterile fashion. 5 cc of local anesthesia was injected and then a dorsal midline incision was made and  carried down to the lumbo sacral fascia. The fascia was opened and the paraspinous musculature was taken down in a subperiosteal fashion to expose L2-3 and L4-5 on the left as well as the previously placed instrumentation. The previously placed instrumentation was removed by removing the locking caps followed by the rods and then the screws. Intraoperative x-ray confirmed my level, and then I used a combination of the high-speed drill and the Kerrison punches to perform a hemilaminectomy, medial facetectomy, and foraminotomy at L2-3 and L4-5 on the left. The underlying yellow ligament was opened and removed in a piecemeal fashion to expose the underlying dura and exiting nerve root. I undercut the lateral recess and dissected down until I was medial to and distal to the pedicle. The nerve root was well decompressed at both levels.  I then palpated with a coronary dilator along the nerve root and into the foramen to assure adequate decompression. I felt no more compression of the nerve root at either level. During the decompression at L2-3 on the left and felt like I had removed most of the medial facet and therefore I was worried about iatrogenic instability adjacent to his previous fusion. The previously placed hardware which had broken at L4 may be concerned that he may have a pseudoarthrosis at L3-4. Tried to explore this fusion as best I could and tried to look for abnormal motion at L3-4. I decided to expose the lamina of L2 as well as the transverse process of L3 and the lateral facet of L4 for posterior lateral fusion from L2-L4 on the right to address possible pseudoarthrosis and possible iatrogenic destabilization. These areas were drilled and then a mixture of local autograft and morcellized allograft was used to perform  arthrodesis at these 2 levels. I irrigated with saline solution containing bacitracin. Achieved hemostasis with bipolar cautery, lined the dura with Gelfoam, and then closed the fascia  with 0 Vicryl. I closed the subcutaneous tissues with 2-0 Vicryl and the subcuticular tissues with 3-0 Vicryl. The skin was then closed with benzoin and Steri-Strips. The drapes were removed, a sterile dressing was applied. The patient was awakened from general anesthesia and transferred to the recovery room in stable condition. At the end of the procedure all sponge, needle and instrument counts were correct.    PLAN OF CARE: Admit to inpatient   PATIENT DISPOSITION:  PACU - hemodynamically stable.   Delay start of Pharmacological VTE agent (>24hrs) due to surgical blood loss or risk of bleeding:  yes

## 2016-05-18 NOTE — Anesthesia Preprocedure Evaluation (Signed)
Anesthesia Evaluation  Patient identified by MRN, date of birth, ID band Patient awake    Reviewed: Allergy & Precautions, NPO status , Patient's Chart, lab work & pertinent test results, reviewed documented beta blocker date and time   History of Anesthesia Complications Negative for: history of anesthetic complications  Airway Mallampati: II  TM Distance: >3 FB Neck ROM: Full    Dental  (+) Teeth Intact, Dental Advisory Given   Pulmonary sleep apnea and Continuous Positive Airway Pressure Ventilation ,    breath sounds clear to auscultation       Cardiovascular hypertension, Pt. on medications + CAD and + CABG (re do 2004)  + dysrhythmias Atrial Fibrillation + pacemaker (dual chamber)  Rhythm:Regular  ECHO 2015 EF 60%, followed by cardiology regulary   Neuro/Psych PSYCHIATRIC DISORDERS  Neuromuscular disease    GI/Hepatic negative GI ROS, Neg liver ROS,   Endo/Other  diabetes, Poorly Controlled, Type 2, Insulin Dependent  Renal/GU Renal InsufficiencyRenal diseaseGFR 50     Musculoskeletal   Abdominal (+)  Abdomen: soft.    Peds  Hematology  (+) anemia , 12/38   Anesthesia Other Findings   Reproductive/Obstetrics                             Anesthesia Physical Anesthesia Plan  ASA: III  Anesthesia Plan: General   Post-op Pain Management:    Induction: Intravenous  Airway Management Planned: Oral ETT  Additional Equipment: None  Intra-op Plan:   Post-operative Plan: Extubation in OR  Informed Consent: I have reviewed the patients History and Physical, chart, labs and discussed the procedure including the risks, benefits and alternatives for the proposed anesthesia with the patient or authorized representative who has indicated his/her understanding and acceptance.   Dental advisory given  Plan Discussed with: CRNA and Surgeon  Anesthesia Plan Comments:          Anesthesia Quick Evaluation

## 2016-05-19 ENCOUNTER — Encounter (HOSPITAL_COMMUNITY): Payer: Self-pay | Admitting: Neurological Surgery

## 2016-05-19 LAB — GLUCOSE, CAPILLARY: GLUCOSE-CAPILLARY: 246 mg/dL — AB (ref 65–99)

## 2016-05-19 MED ORDER — METHOCARBAMOL 500 MG PO TABS: 500.0000 mg | ORAL_TABLET | Freq: Four times a day (QID) | ORAL | 1 refills | Status: DC | PRN

## 2016-05-19 MED ORDER — HYDROMORPHONE HCL 2 MG PO TABS: 2.0000 mg | ORAL_TABLET | ORAL | 0 refills | Status: DC | PRN

## 2016-05-19 NOTE — Progress Notes (Signed)
Patient is discharged from room 3C04 at this time. Alert and in stable condition. IV site d/c'd and instructions read to patient and spouse with understanding verbalized. Left unit via wheelchair with all belongings at side.

## 2016-05-19 NOTE — Anesthesia Postprocedure Evaluation (Addendum)
Anesthesia Post Note  Patient: LINFORD QUINTELA  Procedure(s) Performed: Procedure(s) (LRB): Laminectomy and Foraminotomy - Lumbar two-lumbar three- Lumbar four-lumbar five- left with removal hardware lumbar three-four (Left) Removal of broken hardware Lumbar three-four (N/A)  Patient location during evaluation: PACU Anesthesia Type: General Level of consciousness: awake and alert Pain management: pain level controlled Vital Signs Assessment: post-procedure vital signs reviewed and stable Respiratory status: spontaneous breathing, nonlabored ventilation, respiratory function stable and patient connected to nasal cannula oxygen Cardiovascular status: blood pressure returned to baseline and stable Postop Assessment: no signs of nausea or vomiting Anesthetic complications: no       Last Vitals:  Vitals:   05/19/16 0430 05/19/16 0744  BP: (!) 146/68 137/70  Pulse: 60 63  Resp: 18 18  Temp: 36.7 C 36.7 C    Last Pain:  Vitals:   05/19/16 0950  TempSrc:   PainSc: 2                  Ferd Horrigan

## 2016-05-19 NOTE — Discharge Summary (Signed)
Physician Discharge Summary  Patient ID: Alan [name] MRN: [id number] DOB/AGE: [birth year] 76 y.o.  Admit date: 05/18/2016 Discharge date: 05/19/2016  Admission Diagnoses: adjacent level stenosis, possible pseudoarthrosis    Discharge Diagnoses: same   Discharged Condition: good  Hospital Course: The patient was admitted on 05/18/2016 and taken to the operating room where the patient underwent removal of hardware, re-do fusion, and decompression L2-3, L4-5. The patient tolerated the procedure well and was taken to the recovery room and then to the floor in stable condition. The hospital course was routine. There were no complications. The wound remained clean dry and intact. Pt had appropriate back soreness. No complaints of leg pain or new N/T/W. The patient remained afebrile with stable vital signs, and tolerated a regular diet. The patient continued to increase activities, and pain was well controlled with oral pain medications.   Consults: None  Significant Diagnostic Studies:  Results for orders placed or performed during the hospital encounter of 05/18/16  Hemoglobin A1c  Result Value Ref Range   Hgb A1c MFr Bld 6.8 (H) 4.8 - 5.6 %   Mean Plasma Glucose 148 mg/dL  Glucose, capillary  Result Value Ref Range   Glucose-Capillary 76 65 - 99 mg/dL  Glucose, capillary  Result Value Ref Range   Glucose-Capillary 176 (H) 65 - 99 mg/dL   Comment 1 Notify RN    Comment 2 Document in Chart   Glucose, capillary  Result Value Ref Range   Glucose-Capillary 297 (H) 65 - 99 mg/dL   Comment 1 Notify RN    Comment 2 Document in Chart     Chest 2 View  Result Date: 05/09/2016 CLINICAL DATA:  Preoperative evaluation for lumbar surgery. Hypertension. EXAM: CHEST  2 VIEW COMPARISON:  March 08, 2012 FINDINGS: There is no edema or consolidation. Heart is upper normal in size with pulmonary vascularity within normal limits. No adenopathy. There is atherosclerotic calcification in the aorta.  Pacemaker leads are attached the right atrium and right ventricle. There is degenerative change in the thoracic spine. Patient is status post coronary artery bypass grafting. IMPRESSION: No edema or consolidation. Status post pacemaker placement and coronary artery bypass grafting. Aortic atherosclerosis. Electronically Signed   By: Lowella Grip III M.D.   On: 05/09/2016 15:28   Dg Lumbar Spine 1 View  Result Date: 05/18/2016 CLINICAL DATA:  76 year old the male lumbar spine surgery EXAM: LUMBAR SPINE - 1 VIEW COMPARISON:  Prior CT myelogram 04/10/2016 FINDINGS: Single intraoperative fluoroscopic cross-table lateral during lumbar surgery. Surgical re- tractor in the soft tissues posterior to the L5 pedicles. Inferior curette identifies the L4-L5 foramen. Superior curette identifies the facet of L2-L3. Disc spacer at the L3-L4 level.  Fractured pedicle screws at L4. IMPRESSION: Single cross-table lateral of the lumbar spine, as above. Electronically Signed   By: Corrie Mckusick D.O.   On: 05/18/2016 13:09    Antibiotics:  Anti-infectives    Start     Dose/Rate Route Frequency Ordered Stop   05/18/16 2000  ceFAZolin (ANCEF) IVPB 2g/100 mL premix     2 g 200 mL/hr over 30 Minutes Intravenous Every 8 hours 05/18/16 1632 05/19/16 0515   05/18/16 1050  bacitracin 50,000 Units in sodium chloride irrigation 0.9 % 500 mL irrigation  Status:  Discontinued       As needed 05/18/16 1124 05/18/16 1322   05/18/16 0930  ceFAZolin (ANCEF) IVPB 2g/100 mL premix     2 g 200 mL/hr over 30 Minutes Intravenous To [facility]  05/17/16 1050 05/18/16 1117      Discharge Exam: Blood pressure (!) 146/68, pulse 60, temperature 98.1 F (36.7 C), temperature source Oral, resp. rate 18, SpO2 97 %. Neurologic: Grossly normal Dressing dry  Discharge Medications:   Allergies as of 05/19/2016      Reactions   No Known Allergies       Medication List    TAKE these medications   acetaminophen 500 MG  tablet Commonly known as:  TYLENOL Take 500 mg by mouth 2 (two) times daily as needed (for aches/pain.).   allopurinol 100 MG tablet Commonly known as:  ZYLOPRIM Take 1 tablet (100 mg total) by mouth 2 (two) times daily.   amLODipine 5 MG tablet Commonly known as:  NORVASC Take 1 tablet (5 mg total) by mouth daily.   aspirin EC 81 MG tablet Take 81 mg by mouth daily.   carvedilol 25 MG tablet Commonly known as:  COREG TAKE TWO TABLETS BY MOUTH TWICE DAILY What changed:  See the new instructions.   colchicine 0.6 MG tablet Take 1 tablet (0.6 mg total) by mouth 2 (two) times daily as needed. for gout flareup What changed:  when to take this  additional instructions   diclofenac sodium 1 % Gel Commonly known as:  VOLTAREN Apply 1 g topically 2 (two) times daily as needed (for back pain/aches or pain.).   glipiZIDE 10 MG tablet Commonly known as:  GLUCOTROL Take 10 mg by mouth 2 (two) times daily. For high blood sugars   HYDROmorphone 2 MG tablet Commonly known as:  DILAUDID Take 1-2 tablets (2-4 mg total) by mouth every 4 (four) hours as needed for severe pain.   lisinopril-hydrochlorothiazide 20-25 MG tablet Commonly known as:  PRINZIDE,ZESTORETIC TAKE ONE TABLET BY MOUTH ONCE DAILY What changed:  See the new instructions.   metFORMIN 500 MG tablet Commonly known as:  GLUCOPHAGE Take 500 mg by mouth 4 (four) times daily.   methocarbamol 500 MG tablet Commonly known as:  ROBAXIN Take 1 tablet (500 mg total) by mouth every 6 (six) hours as needed for muscle spasms.   VITAMIN B-12 PO Take 5,000 mcg by mouth daily.       Disposition: home   Final Dx: LL/ fusion  Discharge Instructions     Remove dressing in 72 hours    Complete by:  As directed    Call MD for:  difficulty breathing, headache or visual disturbances    Complete by:  As directed    Call MD for:  persistant nausea and vomiting    Complete by:  As directed    Call MD for:  redness,  tenderness, or signs of infection (pain, swelling, redness, odor or green/yellow discharge around incision site)    Complete by:  As directed    Call MD for:  severe uncontrolled pain    Complete by:  As directed    Call MD for:  temperature >100.4    Complete by:  As directed    Diet - low sodium heart healthy    Complete by:  As directed    Increase activity slowly    Complete by:  As directed       Follow-up Information    Carynn Felling S, MD Follow up in 2 week(s).   Specialty:  Neurosurgery Contact information: [address] [place] [address] [phone]            Signed: [name] 05/19/2016, 6:25 AM

## 2016-05-24 ENCOUNTER — Telehealth: Payer: Self-pay | Admitting: Cardiology

## 2016-05-24 NOTE — Telephone Encounter (Signed)
Pt called and stated that he was having a tingling feeling in his right hand, fingers, and thumb. After consulting w/ device tech RN I instructed pt to call PCP b/c Device Tech Nurse doesn't believe this is related to his device. Pt verbalized understanding.

## 2016-05-26 DIAGNOSIS — G4733 Obstructive sleep apnea (adult) (pediatric): Secondary | ICD-10-CM | POA: Diagnosis not present

## 2016-06-15 ENCOUNTER — Encounter: Payer: Self-pay | Admitting: Cardiovascular Disease

## 2016-06-15 ENCOUNTER — Ambulatory Visit (INDEPENDENT_AMBULATORY_CARE_PROVIDER_SITE_OTHER): Payer: PPO | Admitting: Cardiovascular Disease

## 2016-06-15 VITALS — BP 140/72 | HR 62 | Ht 69.5 in | Wt 223.4 lb

## 2016-06-15 DIAGNOSIS — Z01810 Encounter for preprocedural cardiovascular examination: Secondary | ICD-10-CM | POA: Diagnosis not present

## 2016-06-15 DIAGNOSIS — Z950 Presence of cardiac pacemaker: Secondary | ICD-10-CM | POA: Diagnosis not present

## 2016-06-15 DIAGNOSIS — E669 Obesity, unspecified: Secondary | ICD-10-CM

## 2016-06-15 DIAGNOSIS — I480 Paroxysmal atrial fibrillation: Secondary | ICD-10-CM | POA: Diagnosis not present

## 2016-06-15 DIAGNOSIS — I495 Sick sinus syndrome: Secondary | ICD-10-CM | POA: Diagnosis not present

## 2016-06-15 DIAGNOSIS — E785 Hyperlipidemia, unspecified: Secondary | ICD-10-CM | POA: Diagnosis not present

## 2016-06-15 DIAGNOSIS — E1169 Type 2 diabetes mellitus with other specified complication: Secondary | ICD-10-CM | POA: Diagnosis not present

## 2016-06-15 DIAGNOSIS — I10 Essential (primary) hypertension: Secondary | ICD-10-CM

## 2016-06-15 DIAGNOSIS — I472 Ventricular tachycardia: Secondary | ICD-10-CM | POA: Diagnosis not present

## 2016-06-15 DIAGNOSIS — I255 Ischemic cardiomyopathy: Secondary | ICD-10-CM | POA: Diagnosis not present

## 2016-06-15 DIAGNOSIS — I25718 Atherosclerosis of autologous vein coronary artery bypass graft(s) with other forms of angina pectoris: Secondary | ICD-10-CM

## 2016-06-15 DIAGNOSIS — I4729 Other ventricular tachycardia: Secondary | ICD-10-CM

## 2016-06-15 NOTE — Progress Notes (Signed)
Cardiology Office Note    Date:  06/17/2016   ID:  ORVEL CUTSFORTH, DOB 03/12/1940, MRN [id number]  PCP:  [name], MD  Cardiologist:  Sanda Klein, MD   Chief Complaint  Patient presents with  . Pacemaker Check    History of Present Illness:  Alan [name] is a 76 y.o. male With coronary artery disease, previous CABG, sinus node arrest, status post dual-chamber permanent pacemaker ("atrially dependent"), type 2 diabetes mellitus and dyslipidemia, obstructive sleep apnea on CPAP, presents for a pacemaker check.  He has not had any new complaints of chest pain since his last appointment. He denies palpitations or syncope. He has not had problems with worsening shortness of breath, leg edema, focal neurological complaints or intermittent claudication.  His diabetes therapy has been escalated to Januvia + metformin. Blood pressure control at home is excellent with typical readings around 125/70  Interrogation of his pacemaker shows normal device function. The presenting rhythm was atrial paced ventricular sensed. He has 99.1% atrial pacing and does not have any underlying atrial rhythm or ventricular escape rhythm when pacing is taken down to 30 bpm. There is only 2.3% ventricular pacing. His current generator longevity is estimated at 9.5 years. He has not had any episodes of atrial fibrillation since his last device check. As before, he has had infrequent episodes of nonsustained ventricular tachycardia, but only one episode in the last month, roughly 6 seconds at 154 bpm. The spells of VT occur roughly once or twice a month and are consistently asymptomatic  He has a longstanding history of cardiac problems. He had CABG in 1990 (sequential LIMA to LAD and diagonal), multiple RCA PCI 1990-2004 and a redo CABG in 2004 (SVG-RCA-PDA, with surgical MAZE procedure, Dr. Amador Cunas). Cath 2007 and 2012 show occluded LAD and RCA with patent grafts, 60% ostial OM and diffuse stenoses  distal LCX. 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 He had a generator changeout and ventricular lead revision in January 2014. He does not tolerate VVI pacing. He has had occasional nonsustained VT recorded by the device, always asymptomatic. He has had paroxysmal atrial fibrillation. No history of stroke or TIA. Warfarin was stopped for recurrent GI bleeding requiring transfusions. Despite stopping warfarin anticoagulation, he had another episode of GI bleeding requiring transfusion in April 2015. He was seen at [facility] and underwent repeat endoscopy (endoscopic workup in February was negative). He tells me they found "2 small holes in his stomach" which were cauterized. He has not had any bleeding events since. He remains off anti-coagulation.  He wears CPAP for OSA, has insulin requiring type II DM, mild dyslipidemia intolerant to all statins, post-polio syndrome, lumbar spine surgery 01/2012.  He does not trust nuclear stress tests - reports they were repeatedly wrong in the past. [name] told him he should always have a cath rather than a stress test.      Past Medical History:  Diagnosis Date  . A-fib ([place])   . Anticoagulant long-term use, on coumadin for PAF 03/08/2012  . Chronic back pain   . Complete heart block ([place])   . Coronary artery disease    seen at [facility] cardiology   . Diabetes mellitus   . Hx of echocardiogram 06/29/2008   EF 45-50%   . Hypertension  Dr. Veronia Beets, primary  . Neuromuscular disorder ([place])    carpal tunnel bilaterally  . 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 Polio    hx of  . Sleep apnea    cpap, 11, last sleep study Jan2013, sees Dr. Keturah Barre  . Status post placement of cardiac pacemaker, 03/07/12 New Medtronic Gen and new Vent lead original placed 2008 03/08/2012     Past Surgical History:  Procedure Laterality Date  . APPENDECTOMY    . BACK SURGERY    . CARDIAC CATHETERIZATION    . CORONARY ARTERY BYPASS GRAFT  1990  . CORONARY ARTERY BYPASS GRAFT  2004  . ESOPHAGOGASTRODUODENOSCOPY Left 03/18/2013   Procedure: ESOPHAGOGASTRODUODENOSCOPY (EGD);  Surgeon: Cleotis Nipper, MD;  Location: [facility] ENDOSCOPY;  Service: Endoscopy;  Laterality: Left;  . FRACTURE SURGERY     Hx: of right leg  . HARDWARE REMOVAL N/A 05/18/2016   Procedure: Removal of broken hardware Lumbar three-four;  Surgeon: [name], MD;  Location: [place];  Service: Neurosurgery;  Laterality: N/A;  . HERNIA REPAIR    . INSERT / REPLACE / REMOVE PACEMAKER  03/07/2012   replaced  . JOINT REPLACEMENT     Hx: of left knee  . LEFT HEART CATHETERIZATION WITH CORONARY/GRAFT ANGIOGRAM N/A 12/21/2010   Procedure: LEFT HEART CATHETERIZATION WITH Beatrix Fetters;  Surgeon: Leonie Man, MD;  Location: [facility] CATH LAB;  Service: Cardiovascular;  Laterality: N/A;  . LUMBAR LAMINECTOMY/DECOMPRESSION MICRODISCECTOMY  01/10/2012   Procedure: LUMBAR LAMINECTOMY/DECOMPRESSION MICRODISCECTOMY 1 LEVEL;  Surgeon: [name], MD;  Location: [place] NEURO ORS;  Service: Neurosurgery;  Laterality: Bilateral;  Lumbar three-four decompression, Posterior lateral fusion lumbar three-four, posterior spinus plate lumbar three-four  . LUMBAR LAMINECTOMY/DECOMPRESSION MICRODISCECTOMY Left 05/18/2016   Procedure: Laminectomy and Foraminotomy - Lumbar two-lumbar three- Lumbar four-lumbar five- left with removal hardware lumbar three-four;  Surgeon: [name], MD;  Location: [place];  Service: Neurosurgery;  Laterality: Left;  [name] Kitchen MAZE Procedure  2004  . NASAL SEPTUM SURGERY N/A 14  . PACEMAKER INSERTION  08, 1/14  . PACEMAKER REVISION N/A 03/07/2012   Procedure: PACEMAKER REVISION;  Surgeon: Sanda Klein, MD;  Location: [place] CATH LAB;  Service: Cardiovascular;  Laterality: N/A;  . POSTERIOR FUSION LUMBAR SPINE   08/28/2012   Dr Ronnald Ramp  . SHOULDER ARTHROSCOPY WITH OPEN ROTATOR CUFF REPAIR Right 03/30/2014   Procedure: RIGHT SHOULDER ARTHROSCOPY  OPEN ROTATOR CUFF REPAIR;  Surgeon: Kerin Salen, MD;  Location: [place];  Service: Orthopedics;  Laterality: Right;  . SHOULDER OPEN ROTATOR CUFF REPAIR Right 03/30/2014   Procedure: ROTATOR CUFF REPAIR SHOULDER OPEN;  Surgeon: Kerin Salen, MD;  Location: [place];  Service: Orthopedics;  Laterality: Right;  . STENTS    . TONSILLECTOMY      Current Medications: Outpatient Medications Prior to Visit  Medication Sig Dispense Refill  . acetaminophen (TYLENOL) 500 MG tablet Take 500 mg by mouth 2 (two) times daily as needed (for aches/pain.).    [name] Kitchen allopurinol (ZYLOPRIM) 100 MG tablet Take 1 tablet (100 mg total) by mouth 2 (two) times daily. 60 tablet 3  . amLODipine (NORVASC) 5 MG tablet Take 1 tablet (5 mg total) by mouth daily. 90 tablet 3  . aspirin EC 81 MG tablet Take 81 mg by mouth daily.    . carvedilol (COREG) 25 MG tablet TAKE TWO TABLETS BY MOUTH TWICE DAILY (Patient taking differently: TAKE TWO TABLETS (50 MG) BY MOUTH TWICE DAILY) 360 tablet 2  .  colchicine 0.6 MG tablet Take 1 tablet (0.6 mg total) by mouth 2 (two) times daily as needed. for gout flareup (Patient taking differently: Take 0.6 mg by mouth 2 (two) times daily. for gout flareup) 60 tablet 3  . Cyanocobalamin (VITAMIN B-12 PO) Take 5,000 mcg by mouth daily.     . diclofenac sodium (VOLTAREN) 1 % GEL Apply 1 g topically 2 (two) times daily as needed (for back pain/aches or pain.).     [name] Kitchen glipiZIDE (GLUCOTROL) 10 MG tablet Take 10 mg by mouth 2 (two) times daily. For high blood sugars    . lisinopril-hydrochlorothiazide (PRINZIDE,ZESTORETIC) 20-25 MG tablet TAKE ONE TABLET BY MOUTH ONCE DAILY (Patient taking differently: TAKE ONE TABLET BY MOUTH ONCE DAILY IN THE MORNING) 90 tablet 3  . metFORMIN (GLUCOPHAGE) 500 MG tablet Take 500 mg by mouth 4 (four) times daily.     [name] Kitchen HYDROmorphone (DILAUDID)  2 MG tablet Take 1-2 tablets (2-4 mg total) by mouth every 4 (four) hours as needed for severe pain. 30 tablet 0  . methocarbamol (ROBAXIN) 500 MG tablet Take 1 tablet (500 mg total) by mouth every 6 (six) hours as needed for muscle spasms. 60 tablet 1   No facility-administered medications prior to visit.      Allergies:   No known allergies   Social History   Social History  . Marital status: Single    Spouse name: N/A  . Number of children: 1  . Years of education: 2   Occupational History  . pilot-retired    Social History Main Topics  . Smoking status: Never Smoker  . Smokeless tobacco: Never Used  . Alcohol use Yes     Comment: rarely  . Drug use: No  . Sexual activity: Yes   Other Topics Concern  . None   Social History Narrative   [place], fishes.    Right-handed.   1 cup caffeine per day.     Family History:  The patient's family history includes Cancer in his mother; Other in his father.   ROS:   Please see the history of present illness.    ROS All other systems reviewed and are negative.   PHYSICAL EXAM:   VS:  BP 140/72   Pulse 62   Ht 5' 9.5" (1.765 m)   Wt 223 lb 6.4 oz (101.3 kg)   BMI 32.52 kg/m    GEN: Well nourished, well developed, in no acute distress  HEENT: normal  Neck: no JVD, carotid bruits, or masses Cardiac: Widely split second heart sound, RRR; no murmurs, rubs, or gallops,no edema , healthy subclavian pacemaker site and sternotomy scar Respiratory:  clear to auscultation bilaterally, normal work of breathing GI: soft, nontender, nondistended, + BS MS: no deformity or atrophy  Skin: warm and dry, no rash Neuro:  Alert and Oriented x 3, Strength and sensation are intact Psych: euthymic mood, full affect  Wt Readings from Last 3 Encounters:  06/15/16 223 lb 6.4 oz (101.3 kg)  05/09/16 216 lb 2 oz (98 kg)  04/05/16 221 lb (100.2 kg)      Studies/Labs Reviewed:   EKG:  EKG is ordered today.   The ekg ordered today demonstrates Atrial paced, ventricular sensed rhythm with long AV delay (234 ms) and right bundle branch block (158 ms). QTC 447 ms  Recent Labs: 09/27/2015: TSH 1.520 05/01/2016: ALT 17 05/09/2016: BUN 30; Creatinine, Ser 1.49; Hemoglobin 12.1; Platelets 189; Potassium 4.6; Sodium 138  Lipid Panel    Component Value Date/Time   CHOL 134 12/20/2010 0903   TRIG 104 12/20/2010 0903   HDL 38 (L) 12/20/2010 0903   CHOLHDL 3.5 12/20/2010 0903   VLDL 21 12/20/2010 0903   LDLCALC 75 12/20/2010 0903    Additional studies/ records that were reviewed today include:  Notes from Dr. Tally Due    ASSESSMENT:    1. 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 Paroxysmal atrial fibrillation ([place])   3. SSS (sick sinus syndrome) (HCC)   4. Paroxysmal ventricular tachycardia ([place])   5. Pacemaker - Medtronic Adapta dual chamber   6. Essential hypertension   7. Diabetes mellitus type 2 in obese ([place])   8. Dyslipidemia   9. Mild obesity   10. Preop cardiovascular exam      PLAN:  In order of problems listed above:  1. CAD s/p redo CABG with exertional angina: Currently CCS class I on combination carvedilol and amlodipine. He is very mistrustful of nuclear stress tests. If necessary for ischemia evaluation, he prefers cardiac catheterization. 2. AFib: No episodes of atrial fibrillation for a long time. He has never had an embolic event. CHADSVasc score 4 (Age, vascular disease, diabetes mellitus). Anticoagulation has been withheld due to serious GI bleeding. Continue to monitor for recurrence of arrhythmia via his pacemaker. 3. Sinus node arrest/CHB: Paroxysmal heart block was the reason for initial pacemaker implantation, but he has at least 99% normal AV conduction. He does not have any intrinsic atrial or ventricular escape activity and is pacemaker dependent. 4. NSVT: Episodes are infrequent, once or twice a month, consistently asymptomatic. 5. PPM:  Normal device function. Remote download in 3 months, office visit in 6 months. 6. HTN: Satisfactory control on current regimen. 7. DM: Good hemoglobin A1c, 6.8% in April. He is unhappy about the expense related to use of Janumet. 8. HLP: He has tried every single available statins and cannot tolerate them. His LDL is only slightly outside target range. Prefers not to use PCA skin inhibitors due to cost.  9. Obesity: Weight loss recommended as a way to improve glycemic control and scale back his medications. 10. Left inguinal hernia: Anginal control appears to be satisfactory. I believe he is at low to moderate risk of complications if he plans to undergo abdominal surgery.   Medication Adjustments/Labs and Tests Ordered: Current medicines are reviewed at length with the patient today.  Concerns regarding medicines are outlined above.  Medication changes, Labs and Tests ordered today are listed in the Patient Instructions below. Patient Instructions  Dr Sallyanne Kuster recommends that you continue on your current medications as directed. Please refer to the Current Medication list given to you today.  Remote monitoring is used to monitor your Pacemaker of ICD from home. This monitoring reduces the number of office visits required to check your device to one time per year. It allows [place] to keep an eye on the functioning of your device to ensure it is working properly. You are scheduled for a device check from home on Thursday, August 9th, 2018. You may send your transmission at any time that day. If you have a wireless device, the transmission will be sent automatically. After your physician reviews your transmission, you will receive a postcard with your next transmission date.  Dr Sallyanne Kuster recommends that you schedule a follow-up appointment in 6 months with a pacemaker check. You will receive a reminder letter in the mail two months in advance.  If you don't receive a letter, please call our office to  schedule the follow-up appointment.  If you need a refill on your cardiac medications before your next appointment, please call your pharmacy.    Signed, Sanda Klein, MD  06/17/2016 3:38 PM    [address], [place], [place]  [address] Phone: [phone]; Fax: [phone]

## 2016-06-15 NOTE — Patient Instructions (Signed)
Dr Sallyanne Kuster recommends that you continue on your current medications as directed. Please refer to the Current Medication list given to you today.  Remote monitoring is used to monitor your Pacemaker of ICD from home. This monitoring reduces the number of office visits required to check your device to one time per year. It allows [place] to keep an eye on the functioning of your device to ensure it is working properly. You are scheduled for a device check from home on Thursday, August 9th, 2018. You may send your transmission at any time that day. If you have a wireless device, the transmission will be sent automatically. After your physician reviews your transmission, you will receive a postcard with your next transmission date.  Dr Sallyanne Kuster recommends that you schedule a follow-up appointment in 6 months with a pacemaker check. You will receive a reminder letter in the mail two months in advance. If you don't receive a letter, please call our office to schedule the follow-up appointment.  If you need a refill on your cardiac medications before your next appointment, please call your pharmacy.

## 2016-06-25 DIAGNOSIS — G4733 Obstructive sleep apnea (adult) (pediatric): Secondary | ICD-10-CM | POA: Diagnosis not present

## 2016-06-26 ENCOUNTER — Ambulatory Visit (INDEPENDENT_AMBULATORY_CARE_PROVIDER_SITE_OTHER): Payer: PPO | Admitting: Orthopedic Surgery

## 2016-06-26 ENCOUNTER — Encounter (INDEPENDENT_AMBULATORY_CARE_PROVIDER_SITE_OTHER): Payer: Self-pay | Admitting: Orthopedic Surgery

## 2016-06-26 VITALS — Ht 69.5 in | Wt 223.0 lb

## 2016-06-26 DIAGNOSIS — M2170 Unequal limb length (acquired), unspecified site: Secondary | ICD-10-CM | POA: Diagnosis not present

## 2016-06-26 DIAGNOSIS — M1A0710 Idiopathic chronic gout, right ankle and foot, without tophus (tophi): Secondary | ICD-10-CM | POA: Diagnosis not present

## 2016-06-26 NOTE — Progress Notes (Signed)
Office Visit Note   Patient: Alan [name]           Date of Birth: 1940-03-11           MRN: [id number] Visit Date: 06/26/2016              Requested by: [name], MD [address] [place], [place] [address] PCP: [name], MD  Chief Complaint  Patient presents with  . Right Foot - Follow-up      HPI: Chronic gout #2 leg length inequality on the right. Patient states that he had a leg lengthening procedure for a 1-1/2 inch leg length inequality on the right. Patient denies any flareups from his gout at this time.  Assessment & Plan: Visit Diagnoses:  1. Idiopathic chronic gout of right foot without tophus   2. Leg length difference, acquired     Plan: Recommend that he continue the allopurinol 100 mg once a day discontinue the colchicine and less he has a flareup we will draw uric acid level today. Patient is given a prescription to go to [facility] for orthotics for a half-inch lift for his right shoe  Follow-Up Instructions: Return in about 3 months (around 09/26/2016).   Ortho Exam  Patient is alert, oriented, no adenopathy, well-dressed, normal affect, normal respiratory effort. Examination patient has an antalgic gait. Examination he is one half-inch shortening of the right leg compared to the left. He has no acute gouty flares of his joints noted tophaceous deposits.  Imaging: No results found.  Labs: Lab Results  Component Value Date   HGBA1C 6.8 (H) 05/18/2016   HGBA1C 7.7 (H) 03/26/2014   HGBA1C 6.2 (H) 12/20/2010   LABURIC 7.9 05/01/2016    Orders:  Orders Placed This Encounter  Procedures  . Uric acid   No orders of the defined types were placed in this encounter.    Procedures: No procedures performed  Clinical Data: No additional findings.  ROS:  All other systems negative, except as noted in the HPI. Review of Systems  Objective: Vital Signs: Ht 5' 9.5" (1.765 m)   Wt 223 lb (101.2 kg)   BMI 32.46 kg/m    Specialty Comments:  No specialty comments available.  PMFS History: Patient Active Problem List   Diagnosis Date Noted  . Leg length difference, acquired 06/26/2016  . S/P lumbar spinal fusion 05/18/2016  . Idiopathic chronic gout of multiple sites with tophus 05/01/2016  . Idiopathic chronic gout of right foot without tophus 04/05/2016  . Tophaceous gout 04/05/2016  . Mild obesity 03/16/2016  . Recurrent left inguinal hernia 03/16/2016  . Allergic rhinitis 09/30/2015  . Acute confusional state 09/27/2015  . Memory loss 09/27/2015  . Right inguinal hernia 07/17/2014  . Ventral hernia without obstruction or gangrene 07/17/2014  . Acute blood loss anemia 07/04/2013  . CKD (chronic kidney disease), stage III 07/04/2013  . GI bleed 03/19/2013  . Melena 03/17/2013  . CHB (complete heart block) ([place]) 10/13/2012  . Dyslipidemia 07/11/2012  . Preop cardiovascular exam 07/11/2012  . Ischemic cardiomyopathy 07/11/2012  . Myocardial ischemia 07/11/2012  . Pacemaker - Medtronic Adapta dual chamber 03/08/2012  . Non-sustained ventricular tachycardia ([place]) 12/21/2010  . Hypertension 12/21/2010  . Paroxysmal ventricular tachycardia ([place]) 12/21/2010  . SSS (sick sinus syndrome) ([place])   . Hyperlipidemia 11/03/2010  . DYSPNEA ON EXERTION 12/23/2009  . 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 Diabetes mellitus type 2 in obese ([place]) 12/12/2007  . Obstructive sleep apnea 12/12/2007  .  Coronary atherosclerosis 12/12/2007  . ATRIAL FIBRILLATION, PAROXYSMAL 12/12/2007  . CAD s/p redo CABG 2004 with stable angina pectoris ([place]) 12/12/2007  . Status post aorto-coronary artery bypass graft 12/12/2007   Past Medical History:  Diagnosis Date  . A-fib ([place])   . Anticoagulant long-term use, on coumadin for PAF 03/08/2012  . Chronic back pain   . Complete heart block ([place])   . Coronary artery disease    seen at [facility] cardiology   . Diabetes mellitus   . Hx of echocardiogram 06/29/2008   EF  45-50%   . Hypertension    Dr. Veronia Beets, primary  . Neuromuscular disorder ([place])    carpal tunnel bilaterally  . 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 Polio    hx of  . Sleep apnea    cpap, 11, last sleep study Jan2013, sees Dr. Keturah Barre  . Status post placement of cardiac pacemaker, 03/07/12 New Medtronic Gen and new Vent lead original placed 2008 03/08/2012    Family History  Problem Relation Age of Onset  . Other Father        MVA  . Cancer Mother     Past Surgical History:  Procedure Laterality Date  . APPENDECTOMY    . BACK SURGERY    . CARDIAC CATHETERIZATION    . CORONARY ARTERY BYPASS GRAFT  1990  . CORONARY ARTERY BYPASS GRAFT  2004  . ESOPHAGOGASTRODUODENOSCOPY Left 03/18/2013   Procedure: ESOPHAGOGASTRODUODENOSCOPY (EGD);  Surgeon: Cleotis Nipper, MD;  Location: [facility] ENDOSCOPY;  Service: Endoscopy;  Laterality: Left;  . FRACTURE SURGERY     Hx: of right leg  . HARDWARE REMOVAL N/A 05/18/2016   Procedure: Removal of broken hardware Lumbar three-four;  Surgeon: [name], MD;  Location: [place];  Service: Neurosurgery;  Laterality: N/A;  . HERNIA REPAIR    . INSERT / REPLACE / REMOVE PACEMAKER  03/07/2012   replaced  . JOINT REPLACEMENT     Hx: of left knee  . LEFT HEART CATHETERIZATION WITH CORONARY/GRAFT ANGIOGRAM N/A 12/21/2010   Procedure: LEFT HEART CATHETERIZATION WITH Beatrix Fetters;  Surgeon: Leonie Man, MD;  Location: [facility] CATH LAB;  Service: Cardiovascular;  Laterality: N/A;  . LUMBAR LAMINECTOMY/DECOMPRESSION MICRODISCECTOMY  01/10/2012   Procedure: LUMBAR LAMINECTOMY/DECOMPRESSION MICRODISCECTOMY 1 LEVEL;  Surgeon: [name], MD;  Location: [place] NEURO ORS;  Service: Neurosurgery;  Laterality: Bilateral;  Lumbar three-four decompression, Posterior lateral fusion lumbar three-four, posterior spinus plate lumbar three-four  . LUMBAR LAMINECTOMY/DECOMPRESSION MICRODISCECTOMY Left 05/18/2016   Procedure: Laminectomy and Foraminotomy - Lumbar  two-lumbar three- Lumbar four-lumbar five- left with removal hardware lumbar three-four;  Surgeon: [name], MD;  Location: [place];  Service: Neurosurgery;  Laterality: Left;  [name] Kitchen MAZE Procedure  2004  . NASAL SEPTUM SURGERY N/A 14  . PACEMAKER INSERTION  08, 1/14  . PACEMAKER REVISION N/A 03/07/2012   Procedure: PACEMAKER REVISION;  Surgeon: Sanda Klein, MD;  Location: [place] CATH LAB;  Service: Cardiovascular;  Laterality: N/A;  . POSTERIOR FUSION LUMBAR SPINE  08/28/2012   Dr Ronnald Ramp  . SHOULDER ARTHROSCOPY WITH OPEN ROTATOR CUFF REPAIR Right 03/30/2014   Procedure: RIGHT SHOULDER ARTHROSCOPY  OPEN ROTATOR CUFF REPAIR;  Surgeon: Kerin Salen, MD;  Location: [place];  Service: Orthopedics;  Laterality: Right;  . SHOULDER OPEN ROTATOR CUFF REPAIR Right 03/30/2014   Procedure: ROTATOR CUFF REPAIR SHOULDER OPEN;  Surgeon: Kerin Salen, MD;  Location: [place];  Service: Orthopedics;  Laterality: Right;  . STENTS    .  TONSILLECTOMY     Social History   Occupational History  . pilot-retired    Social History Main Topics  . Smoking status: Never Smoker  . Smokeless tobacco: Never Used  . Alcohol use Yes     Comment: rarely  . Drug use: No  . Sexual activity: Yes

## 2016-06-27 ENCOUNTER — Encounter: Payer: Self-pay | Admitting: Neurology

## 2016-06-27 LAB — URIC ACID: Uric Acid, Serum: 8.2 mg/dL — ABNORMAL HIGH (ref 4.0–8.0)

## 2016-06-28 ENCOUNTER — Telehealth: Payer: Self-pay | Admitting: Neurology

## 2016-06-28 NOTE — Telephone Encounter (Signed)
I spoke to patient and notified him that I have sent the records over.

## 2016-06-28 NOTE — Telephone Encounter (Signed)
[facility] usually faxes [place] a form requesting this information, but we have not received. I have faxed over notes this morning.

## 2016-06-28 NOTE — Telephone Encounter (Signed)
Patient called office in reference to receiving a call from [place] stating his insurance company is needing his office notes to show he was here this year.  Patient states they needs notes before June 9th or patient will be cut off and be responsible for CPAP charges.  Please call

## 2016-06-29 ENCOUNTER — Telehealth: Payer: Self-pay

## 2016-06-29 DIAGNOSIS — E113293 Type 2 diabetes mellitus with mild nonproliferative diabetic retinopathy without macular edema, bilateral: Secondary | ICD-10-CM | POA: Diagnosis not present

## 2016-06-29 NOTE — Telephone Encounter (Signed)
Pt calling back stating he has not had to schedule appointments like this in the past, has agreed to come in on 5-29 checking in at 3:30 for a 4:00

## 2016-06-29 NOTE — Telephone Encounter (Signed)
I called [name] to let him know that [facility] contacted me back and state that he needs to be seen by Dr. Rexene Alberts or a NP between the dates of 04/27/16-07/14/16. I left him a message so I can get him in for an office visit. Those notes will need to be faxed to [facility] before 07/15/16.

## 2016-07-04 ENCOUNTER — Encounter: Payer: Self-pay | Admitting: Neurology

## 2016-07-04 ENCOUNTER — Ambulatory Visit (INDEPENDENT_AMBULATORY_CARE_PROVIDER_SITE_OTHER): Payer: PPO | Admitting: Neurology

## 2016-07-04 VITALS — BP 148/66 | HR 82 | Resp 16 | Ht 69.5 in | Wt 220.0 lb

## 2016-07-04 DIAGNOSIS — G4733 Obstructive sleep apnea (adult) (pediatric): Secondary | ICD-10-CM | POA: Diagnosis not present

## 2016-07-04 DIAGNOSIS — Z9989 Dependence on other enabling machines and devices: Secondary | ICD-10-CM | POA: Diagnosis not present

## 2016-07-04 NOTE — Progress Notes (Signed)
Subjective:    Patient ID: Alan [name] is a 76 y.o. male.  HPI     Interim history:   Alan [name] is a 76 year old right-handed gentleman with a complex medical history of hypertension, coronary artery disease, complete heart block, chronic back pain, hyperlipidemia, history of upper GI bleed, A. fib, chronic kidney disease, status post pacemaker placement, postpolio, and obesity, who presents for follow-up consultation of his obstructive sleep apnea, on CPAP therapy. The patient is unaccompanied today. I first met him on 03/02/2016 at the request of his primary care physician, at which time he reported a long-standing history of obstructive sleep apnea and CPAP therapy. He was compliant with his CPAP treatments. He needed new supplies which I provided at the time. Since he was doing well on CPAP therapy and needed new supplies and a new machine I suggested a one-year checkup. Per insurance requirements he needed a face-to-face visit this year.  Today, 07/04/2016 (all dictated new, as well as above notes, some dictation done in note pad or Word, outside of chart, may appear as copied):  I reviewed his CPAP compliance data from 05/29/2016 through 06/27/2016 which is a total of 30 days, during which time he used his CPAP every night with percent used days greater than 4 hours at 97%, indicating excellent compliance with an average usage of 7 hours and 40 minutes, residual AHI 0.8 per hour, leak on the high side for the 95th percentile at 59.2 L/m. He reports doing well, compliant with CPAP, feels well. Did not like the heated humidity, in fact is using ice in the humidifier container. He is advised not to use regular water or ice, but to use distilled water. He does not notice of a high leak, using a nasal mask. He does not have a chinstrap.   The patient's allergies, current medications, family history, past medical history, past social history, past surgical history and problem list were reviewed and  updated as appropriate.   Previously (copied from previous notes for reference):   03/02/2016: He was previously diagnosed with obstructive sleep apnea and placed on CPAP therapy. Prior sleep study results are not available for my review today, a CPAP download was reviewed today from 02/01/2016 through 03/01/2016, which is a total of 30 days, during which time he used his machine every night with percent used days greater than 4 hours at 100%, indicating superb compliance, average usage of 7 hours and 40 minutes, residual AHI 0.7 per hour, leak on the high side with the 95th percentile at 70.2 L/m on a pressure of 12 cm with EPR of 3. He has an S9 Elite CPAP machine which is at least 75 years old. He is also status post UPPP for OSA. I reviewed your office note 02/09/2016. He reports compliance with his CPAP and uses nasal pillows. He needs new supplies. He saw my colleague, Dr. Krista Blue twice last year for confusion, dizziness, concern for TIA. I reviewed her note from 11/16/2015. The patient is retired. 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 He lives with his wife. He has one daughter and 2 grandchildren. He is a nonsmoker, drinks alcohol very occasionally and caffeine in the form of coffee, typically 2 cups in the morning. Bedtime varies and generally between 9 PM and 10:30 PM. Wakeup time is between 5 and 7 AM. He has nocturia usually once per night and denies morning headaches. He denies any frank restless leg symptoms.  He has been trying to lose weight. He has lost about 22 pounds since February 2016 when his weight was listed to be 236 pounds, current weight 214 pounds. He used to follow with Dr. Baird Lyons for his sleep apnea. He would like to be able to get a new or updated CPAP machine if possible.  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 He had ankle fusion on the right and  surgery to the right leg. He still has decrease in range of motion in his right leg and foot. His Epworth sleepiness score is 13 out of 24 today, his fatigue score is 19 out of 63. He occasionally takes a nap.  His Past Medical History Is Significant For: Past Medical History:  Diagnosis Date  . A-fib ([place])   . Anticoagulant long-term use, on coumadin for PAF 03/08/2012  . Chronic back pain   . Complete heart block ([place])   . Coronary artery disease    seen at [facility] cardiology   . Diabetes mellitus   . Hx of echocardiogram 06/29/2008   EF 45-50%   . Hypertension    Dr. Veronia Beets, primary  . Neuromuscular disorder ([place])    carpal tunnel bilaterally  . 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 Polio    hx of  . Sleep apnea    cpap, 11, last sleep study Jan2013, sees Dr. Keturah Barre  . Status post placement of cardiac pacemaker, 03/07/12 New Medtronic Gen and new Vent lead original placed 2008 03/08/2012    Her Past Surgical History Is Significant For: Past Surgical History:  Procedure Laterality Date  . APPENDECTOMY    . BACK SURGERY    . CARDIAC CATHETERIZATION    . CORONARY ARTERY BYPASS GRAFT  1990  . CORONARY ARTERY BYPASS GRAFT  2004  . ESOPHAGOGASTRODUODENOSCOPY Left 03/18/2013   Procedure: ESOPHAGOGASTRODUODENOSCOPY (EGD);  Surgeon: Cleotis Nipper, MD;  Location[facility] ENDOSCOPY;  Service: Endoscopy;  Laterality: Left;  . FRACTURE SURGERY     Hx: of right leg  . HARDWARE REMOVAL N/A 05/18/2016   Procedure: Removal of broken hardware Lumbar three-four;  Surgeon: [name], MD;  Location: ;  Service: Neurosurgery;  Laterality: N/A;  . HERNIA REPAIR    . INSERT / REPLACE / REMOVE PACEMAKER  03/07/2012   replaced  . JOINT REPLACEMENT     Hx: of left knee  . LEFT HEART CATHETERIZATION WITH CORONARY/GRAFT ANGIOGRAM N/A 12/21/2010   Procedure: LEFT HEART CATHETERIZATION WITH Beatrix Fetters;  Surgeon: Leonie Man, MD;  Location: [facility] CATH LAB;  Service:  Cardiovascular;  Laterality: N/A;  . LUMBAR LAMINECTOMY/DECOMPRESSION MICRODISCECTOMY  01/10/2012   Procedure: LUMBAR LAMINECTOMY/DECOMPRESSION MICRODISCECTOMY 1 LEVEL;  Surgeon: [name], MD;  Location: [place] NEURO ORS;  Service: Neurosurgery;  Laterality: Bilateral;  Lumbar three-four decompression, Posterior lateral fusion lumbar three-four, posterior spinus plate lumbar three-four  . LUMBAR LAMINECTOMY/DECOMPRESSION MICRODISCECTOMY Left 05/18/2016   Procedure: Laminectomy and Foraminotomy - Lumbar two-lumbar three- Lumbar four-lumbar five- left with removal hardware lumbar three-four;  Surgeon: [name], MD;  Location: [place];  Service: Neurosurgery;  Laterality: Left;  [name] Kitchen MAZE Procedure  2004  . NASAL SEPTUM SURGERY N/A 14  . PACEMAKER INSERTION  08, 1/14  . PACEMAKER REVISION N/A 03/07/2012   Procedure: PACEMAKER REVISION;  Surgeon: Sanda Klein, MD;  Location: [place] CATH LAB;  Service: Cardiovascular;  Laterality: N/A;  .  POSTERIOR FUSION LUMBAR SPINE  08/28/2012   Dr Ronnald Ramp  . SHOULDER ARTHROSCOPY WITH OPEN ROTATOR CUFF REPAIR Right 03/30/2014   Procedure: RIGHT SHOULDER ARTHROSCOPY  OPEN ROTATOR CUFF REPAIR;  Surgeon: Kerin Salen, MD;  Location: [place];  Service: Orthopedics;  Laterality: Right;  . SHOULDER OPEN ROTATOR CUFF REPAIR Right 03/30/2014   Procedure: ROTATOR CUFF REPAIR SHOULDER OPEN;  Surgeon: Kerin Salen, MD;  Location: [place];  Service: Orthopedics;  Laterality: Right;  . STENTS    . TONSILLECTOMY      His Family History Is Significant For: Family History  Problem Relation Age of Onset  . Other Father        MVA  . Cancer Mother     His Social History Is Significant For: Social History   Social History  . Marital status: Single    Spouse name: N/A  . Number of children: 1  . Years of education: 53   Occupational History  . pilot-retired    Social History Main Topics  . Smoking status: Never Smoker  . Smokeless tobacco: Never Used  . Alcohol use Yes      Comment: rarely  . Drug use: No  . Sexual activity: Yes   Other Topics Concern  . None   Social History Narrative   [place], [place].    Right-handed.   1 cup caffeine per day.    His Allergies Are:  Allergies  Allergen Reactions  . No Known Allergies   :   His Current Medications Are:  Outpatient Encounter Prescriptions as of 07/04/2016  Medication Sig  . acetaminophen (TYLENOL) 500 MG tablet Take 500 mg by mouth 2 (two) times daily as needed (for aches/pain.).  [name] Kitchen allopurinol (ZYLOPRIM) 100 MG tablet Take 1 tablet (100 mg total) by mouth 2 (two) times daily.  [name] Kitchen aspirin EC 81 MG tablet Take 81 mg by mouth daily.  . carvedilol (COREG) 25 MG tablet TAKE TWO TABLETS BY MOUTH TWICE DAILY (Patient taking differently: TAKE TWO TABLETS (50 MG) BY MOUTH TWICE DAILY)  . colchicine 0.6 MG tablet Take 1 tablet (0.6 mg total) by mouth 2 (two) times daily as needed. for gout flareup (Patient taking differently: Take 0.6 mg by mouth 2 (two) times daily. for gout flareup)  . Cyanocobalamin (VITAMIN B-12 PO) Take 5,000 mcg by mouth daily.   . diclofenac sodium (VOLTAREN) 1 % GEL Apply 1 g topically 2 (two) times daily as needed (for back pain/aches or pain.).   [name] Kitchen glipiZIDE (GLUCOTROL) 10 MG tablet Take 10 mg by mouth 2 (two) times daily. For high blood sugars  . lisinopril-hydrochlorothiazide (PRINZIDE,ZESTORETIC) 20-25 MG tablet TAKE ONE TABLET BY MOUTH ONCE DAILY (Patient taking differently: TAKE ONE TABLET BY MOUTH ONCE DAILY IN THE MORNING)  . metFORMIN (GLUCOPHAGE) 500 MG tablet Take 500 mg by mouth 4 (four) times daily.   . ONE TOUCH ULTRA TEST test strip   . amLODipine (NORVASC) 5 MG tablet Take 1 tablet (5 mg total) by mouth daily.   No facility-administered encounter medications on file as of 07/04/2016.   :  Review of Systems:  Out of a complete 14 point review of systems, all are reviewed and negative with the exception of these symptoms as listed  below: Review of Systems  Neurological:       Patient is here for face to face visit after getting a new CPAP machine.     Objective:  Neurologic Exam  Physical Exam Physical Examination:   Vitals:   07/04/16 1553  BP: (!) 148/66  Pulse: 82  Resp: 16   General Examination: The patient is a very pleasant 76 y.o. male in no acute distress. He appears well-developed and well-nourished and well groomed. Good spirits today.  HEENT: Normocephalic, atraumatic, pupils are equal, round and reactive to light and accommodation. Funduscopic exam is normal with sharp disc margins noted. Extraocular tracking is good without limitation to gaze excursion or nystagmus noted. Normal smooth pursuit is noted. Hearing is grossly intac. Face is symmetric with normal facial animation and normal facial sensation. Speech is clear with no dysarthria noted. There is no hypophonia. There is no lip, neck/head, jaw or voice tremor. Neck is supple with full range of passive and active motion. There are no carotid bruits on auscultation. Oropharynx exam reveals: mild mouth dryness, s/p UPPP, adequate dental hygiene. Mallampati is class II. Tongue protrudes centrally and palate elevates symmetrically. Tonsils are absent.   Chest: Clear to auscultation without wheezing, rhonchi or crackles noted.  Heart: S1+S2+0, regular and normal without murmurs, rubs or gallops noted.   Abdomen: Soft, non-tender and non-distended with normal bowel sounds appreciated on auscultation.  Extremities: There is trace pitting edema in the distal lower extremities bilaterally. Pedal pulses are intact.  Skin: Warm and dry without trophic changes noted.  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   Neurologically:  Mental status: The patient is awake, alert and oriented in all 4 spheres. His immediate and remote memory,  attention, language skills and fund of knowledge are appropriate. There is no evidence of aphasia, agnosia, apraxia or anomia. Speech is clear with normal prosody and enunciation. Thought process is linear. Mood is normal and affect is normal.  Cranial nerves II - XII are as described above under HEENT exam. 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 No major change in exam. There is no tremor. Romberg is negative. Reflexes are 1+ throughout, absent reflexes in the right leg. Fine motor skills and coordination: intact in the UEs, and LEs, except R ankle.   Cerebellar testing: No dysmetria or intention tremor. There is no truncal or gait ataxia.  Sensory exam: intact to light touch.  Gait, station and balance: He stands easily. No veering to one side is noted. No leaning to one side is noted. Posture is age-appropriate and stance is narrow based. Gait shows normal stride length and normal pace. No problems turning are noted.              Assessment and Plan:  In summary, Alan [name] is a very pleasant 76 year old male with a complex medical history of hypertension, coronary artery disease, complete heart block, chronic back pain, hyperlipidemia, history of upper GI bleed, A. fib, chronic kidney disease, status post pacemaker placement, postpolio, and obesity, who Presents for follow-up consultation of his obstructive sleep apnea, on CPAP therapy at home at a pressure of 12 cm. He continues to be compliant with treatment. His AHI is 0.8 per hour for the past month. He is commended for his ongoing great CPAP adherence. Unfortunately, his leak is high consistently. He maybe opening his mouth at night and not realizing it. Of note he does endorse mouth dryness. He is reminded tease distilled  water in his humidifier and if he does not like the heated humidity he can turn the heat off. Furthermore, he is  advised to get back in touch with his DME company regarding the higher leak. He may benefit from trying a chinstrap which I prescribed today. I suggested we keep her appointment for next year for his routine checkup, physical exam is stable. I answered all his questions today and he was in agreement with the plan.  I spent 20 minutes in total face-to-face time with the patient, more than 50% of which was spent in counseling and coordination of care, reviewing test results, reviewing medication and discussing or reviewing the diagnosis of OSA, its prognosis and treatment options. Pertinent laboratory and imaging test results that were available during this visit with the patient were reviewed by me and considered in my medical decision making (see chart for details).

## 2016-07-04 NOTE — Patient Instructions (Signed)
Please continue using your CPAP regularly. While your insurance requires that you use CPAP at least 4 hours each night on 70% of the nights, I recommend, that you not skip any nights and use it throughout the night if you can. Getting used to CPAP and staying with the treatment long term does take time and patience and discipline. Untreated obstructive sleep apnea when it is moderate to severe can have an adverse impact on cardiovascular health and raise her risk for heart disease, arrhythmias, hypertension, congestive heart failure, stroke and diabetes. Untreated obstructive sleep apnea causes sleep disruption, nonrestorative sleep, and sleep deprivation. This can have an impact on your day to day functioning and cause daytime sleepiness and impairment of cognitive function, memory loss, mood disturbance, and problems focussing. Using CPAP regularly can improve these symptoms.  We will ask your DME company to work on reducing your air leak from the mask, including using a chin strap.

## 2016-07-10 NOTE — Addendum Note (Signed)
Addendum  created 07/10/16 1349 by Oleta Mouse, MD   Sign clinical note

## 2016-07-15 ENCOUNTER — Other Ambulatory Visit: Payer: Self-pay | Admitting: Cardiology

## 2016-07-20 DIAGNOSIS — G5601 Carpal tunnel syndrome, right upper limb: Secondary | ICD-10-CM | POA: Diagnosis not present

## 2016-07-26 DIAGNOSIS — G4733 Obstructive sleep apnea (adult) (pediatric): Secondary | ICD-10-CM | POA: Diagnosis not present

## 2016-08-03 DIAGNOSIS — G5601 Carpal tunnel syndrome, right upper limb: Secondary | ICD-10-CM | POA: Diagnosis not present

## 2016-08-22 DIAGNOSIS — H2512 Age-related nuclear cataract, left eye: Secondary | ICD-10-CM | POA: Diagnosis not present

## 2016-08-22 DIAGNOSIS — H2511 Age-related nuclear cataract, right eye: Secondary | ICD-10-CM | POA: Diagnosis not present

## 2016-08-22 DIAGNOSIS — E119 Type 2 diabetes mellitus without complications: Secondary | ICD-10-CM | POA: Diagnosis not present

## 2016-08-24 ENCOUNTER — Other Ambulatory Visit (HOSPITAL_COMMUNITY): Payer: Self-pay | Admitting: Neurological Surgery

## 2016-08-24 DIAGNOSIS — M25512 Pain in left shoulder: Secondary | ICD-10-CM

## 2016-08-25 DIAGNOSIS — G4733 Obstructive sleep apnea (adult) (pediatric): Secondary | ICD-10-CM | POA: Diagnosis not present

## 2016-09-05 ENCOUNTER — Ambulatory Visit (HOSPITAL_COMMUNITY)
Admission: RE | Admit: 2016-09-05 | Discharge: 2016-09-05 | Disposition: A | Discharge status: Home / Self Care | Payer: PPO | Source: Ambulatory Visit | Attending: Neurological Surgery | Admitting: Neurological Surgery

## 2016-09-05 DIAGNOSIS — I700 Atherosclerosis of aorta: Secondary | ICD-10-CM | POA: Insufficient documentation

## 2016-09-05 DIAGNOSIS — M7582 Other shoulder lesions, left shoulder: Secondary | ICD-10-CM | POA: Diagnosis not present

## 2016-09-05 DIAGNOSIS — M25512 Pain in left shoulder: Secondary | ICD-10-CM | POA: Insufficient documentation

## 2016-09-05 DIAGNOSIS — R937 Abnormal findings on diagnostic imaging of other parts of musculoskeletal system: Secondary | ICD-10-CM

## 2016-09-05 LAB — GLUCOSE, CAPILLARY: GLUCOSE-CAPILLARY: 80 mg/dL (ref 65–99)

## 2016-09-05 MED ORDER — LIDOCAINE HCL (PF) 1 % IJ SOLN
INTRAMUSCULAR | Status: AC
Start: 2016-09-05 — End: 2016-09-05
  Administered 2016-09-05: 5 mL via INTRADERMAL
  Filled 2016-09-05: qty 5

## 2016-09-05 MED ORDER — IOPAMIDOL (ISOVUE-M 200) INJECTION 41%
20.0000 mL | Freq: Once | INTRAMUSCULAR | Status: AC
  Administered 2016-09-05: 6 mL via INTRA_ARTICULAR

## 2016-09-05 MED ORDER — IOPAMIDOL (ISOVUE-M 200) INJECTION 41%
INTRAMUSCULAR | Status: AC
  Administered 2016-09-05: 6 mL via INTRA_ARTICULAR
  Filled 2016-09-05: qty 10

## 2016-09-05 MED ORDER — LIDOCAINE HCL (PF) 1 % IJ SOLN
5.0000 mL | Freq: Once | INTRAMUSCULAR | Status: AC
  Administered 2016-09-05: 5 mL via INTRADERMAL

## 2016-09-14 DIAGNOSIS — H2511 Age-related nuclear cataract, right eye: Secondary | ICD-10-CM | POA: Diagnosis not present

## 2016-09-25 ENCOUNTER — Ambulatory Visit (INDEPENDENT_AMBULATORY_CARE_PROVIDER_SITE_OTHER): Payer: PPO | Admitting: Orthopedic Surgery

## 2016-09-25 DIAGNOSIS — G4733 Obstructive sleep apnea (adult) (pediatric): Secondary | ICD-10-CM | POA: Diagnosis not present

## 2016-09-25 DIAGNOSIS — H2512 Age-related nuclear cataract, left eye: Secondary | ICD-10-CM | POA: Diagnosis not present

## 2016-09-28 DIAGNOSIS — H2512 Age-related nuclear cataract, left eye: Secondary | ICD-10-CM | POA: Diagnosis not present

## 2016-10-16 DIAGNOSIS — L219 Seborrheic dermatitis, unspecified: Secondary | ICD-10-CM | POA: Diagnosis not present

## 2016-10-16 DIAGNOSIS — L259 Unspecified contact dermatitis, unspecified cause: Secondary | ICD-10-CM | POA: Diagnosis not present

## 2016-10-26 DIAGNOSIS — G4733 Obstructive sleep apnea (adult) (pediatric): Secondary | ICD-10-CM | POA: Diagnosis not present

## 2016-10-27 DIAGNOSIS — L578 Other skin changes due to chronic exposure to nonionizing radiation: Secondary | ICD-10-CM | POA: Diagnosis not present

## 2016-11-07 DIAGNOSIS — Z23 Encounter for immunization: Secondary | ICD-10-CM | POA: Diagnosis not present

## 2016-11-17 DIAGNOSIS — G4733 Obstructive sleep apnea (adult) (pediatric): Secondary | ICD-10-CM | POA: Diagnosis not present

## 2016-11-20 ENCOUNTER — Other Ambulatory Visit: Payer: Self-pay

## 2016-11-20 DIAGNOSIS — L309 Dermatitis, unspecified: Secondary | ICD-10-CM | POA: Diagnosis not present

## 2016-11-20 DIAGNOSIS — L570 Actinic keratosis: Secondary | ICD-10-CM | POA: Diagnosis not present

## 2016-11-20 DIAGNOSIS — D0462 Carcinoma in situ of skin of left upper limb, including shoulder: Secondary | ICD-10-CM | POA: Diagnosis not present

## 2016-11-25 DIAGNOSIS — G4733 Obstructive sleep apnea (adult) (pediatric): Secondary | ICD-10-CM | POA: Diagnosis not present

## 2016-12-07 DIAGNOSIS — H9202 Otalgia, left ear: Secondary | ICD-10-CM | POA: Diagnosis not present

## 2016-12-07 DIAGNOSIS — E118 Type 2 diabetes mellitus with unspecified complications: Secondary | ICD-10-CM | POA: Diagnosis not present

## 2016-12-07 DIAGNOSIS — E1165 Type 2 diabetes mellitus with hyperglycemia: Secondary | ICD-10-CM | POA: Diagnosis not present

## 2016-12-19 DIAGNOSIS — Z6834 Body mass index (BMI) 34.0-34.9, adult: Secondary | ICD-10-CM | POA: Diagnosis not present

## 2016-12-19 DIAGNOSIS — I10 Essential (primary) hypertension: Secondary | ICD-10-CM | POA: Diagnosis not present

## 2016-12-19 DIAGNOSIS — R29898 Other symptoms and signs involving the musculoskeletal system: Secondary | ICD-10-CM | POA: Diagnosis not present

## 2016-12-20 DIAGNOSIS — E118 Type 2 diabetes mellitus with unspecified complications: Secondary | ICD-10-CM | POA: Diagnosis not present

## 2016-12-20 DIAGNOSIS — J069 Acute upper respiratory infection, unspecified: Secondary | ICD-10-CM | POA: Diagnosis not present

## 2016-12-20 DIAGNOSIS — E1165 Type 2 diabetes mellitus with hyperglycemia: Secondary | ICD-10-CM | POA: Diagnosis not present

## 2016-12-21 DIAGNOSIS — B91 Sequelae of poliomyelitis: Secondary | ICD-10-CM | POA: Diagnosis not present

## 2016-12-21 DIAGNOSIS — R29898 Other symptoms and signs involving the musculoskeletal system: Secondary | ICD-10-CM | POA: Diagnosis not present

## 2016-12-21 DIAGNOSIS — M6281 Muscle weakness (generalized): Secondary | ICD-10-CM | POA: Diagnosis not present

## 2016-12-22 ENCOUNTER — Ambulatory Visit: Payer: PPO | Admitting: Cardiovascular Disease

## 2016-12-22 ENCOUNTER — Encounter: Payer: Self-pay | Admitting: Cardiovascular Disease

## 2016-12-22 VITALS — BP 156/70 | HR 63 | Ht 70.0 in | Wt 226.4 lb

## 2016-12-22 DIAGNOSIS — I495 Sick sinus syndrome: Secondary | ICD-10-CM | POA: Diagnosis not present

## 2016-12-22 DIAGNOSIS — I4729 Other ventricular tachycardia: Secondary | ICD-10-CM

## 2016-12-22 DIAGNOSIS — Z950 Presence of cardiac pacemaker: Secondary | ICD-10-CM | POA: Diagnosis not present

## 2016-12-22 DIAGNOSIS — I25718 Atherosclerosis of autologous vein coronary artery bypass graft(s) with other forms of angina pectoris: Secondary | ICD-10-CM

## 2016-12-22 DIAGNOSIS — E669 Obesity, unspecified: Secondary | ICD-10-CM

## 2016-12-22 DIAGNOSIS — I480 Paroxysmal atrial fibrillation: Secondary | ICD-10-CM | POA: Diagnosis not present

## 2016-12-22 DIAGNOSIS — I472 Ventricular tachycardia: Secondary | ICD-10-CM

## 2016-12-22 DIAGNOSIS — E1169 Type 2 diabetes mellitus with other specified complication: Secondary | ICD-10-CM | POA: Diagnosis not present

## 2016-12-22 DIAGNOSIS — E782 Mixed hyperlipidemia: Secondary | ICD-10-CM | POA: Diagnosis not present

## 2016-12-22 DIAGNOSIS — I10 Essential (primary) hypertension: Secondary | ICD-10-CM

## 2016-12-22 NOTE — Patient Instructions (Signed)
Dr Sallyanne Kuster recommends that you continue on your current medications as directed. Please refer to the Current Medication list given to you today.  Remote monitoring is used to monitor your Pacemaker of ICD from home. This monitoring reduces the number of office visits required to check your device to one time per year. It allows [place] to keep an eye on the functioning of your device to ensure it is working properly. You are scheduled for a device check from home on Friday, February 15th, 2019. You may send your transmission at any time that day. If you have a wireless device, the transmission will be sent automatically. After your physician reviews your transmission, you will receive a postcard with your next transmission date.  Dr Sallyanne Kuster recommends that you schedule a follow-up appointment in 6 months with a pacemaker check. You will receive a reminder letter in the mail two months in advance. If you don't receive a letter, please call our office to schedule the follow-up appointment.  If you need a refill on your cardiac medications before your next appointment, please call your pharmacy.   Take your blood pressure medicines everyday! Take your blood pressure daily! Reduce your salt intake!  Your physician has requested that you regularly monitor your blood pressure at home. Please use the same machine to check your blood pressure daily. Keep a record of your blood pressures using the log sheet provided. In 2 weeks, please report your readings back to Dr C. You may use our online patient portal 'MyChart' or you can call the office to speak with a nurse.

## 2016-12-22 NOTE — Progress Notes (Signed)
Cardiology Office Note    Date:  12/22/2016   ID:  Alan [name], DOB 11/04/1940, MRN [id number]  PCP:  [name], MD  Cardiologist:  Sanda Klein, MD    chief complaint: Palpitations   History of Present Illness:  Alan [name] is a 76 y.o. male With coronary artery disease, previous CABG, sinus node arrest, status post dual-chamber permanent pacemaker ("atrially dependent"), type 2 diabetes mellitus and dyslipidemia, obstructive sleep apnea on CPAP, presents for complaints of an unusual thumping sensation in his upper left chest.  He wondered whether it could be some morning from his pacemaker.  Is not clear to me whether he is truly describing palpitations.  It sounds more like a muscle twinge he did not have dizziness, syncope, shortness of breath or chest pain.  He has been working Journalist, newspaper for large numbers of people and is a little tired.  He denies exertional dyspnea or angina, edema or claudication or focal neurological complaints.  At home his blood pressure has been in the 140s.  Today it was 156, when rechecked down to 143.  He has been faithfully taking his carvedilol but admits to sometimes not taking his amlodipine, although he does not give me a clear estimation as to why.  He reports good glycemic control and tells me that his last hemoglobin A1c was around 6%.  He also had his lipids checked with Dr. Melford Aase, but I do not yet have a copy of those results.  They were "good".  His pacemaker is functioning normally on a comprehensive check today.  The device was implanted in 2014 (Medtronic Adapta dual-chamber) and still has roughly 10 years of battery.  He is "atrially dependent" without any detectable atrial activity and with 99.7% atrial pacing.  He has normal AV conduction and has only about 1% ventricular pacing.  Heart rate histograms are favorable.  As before he has occasional episodes of nonsustained ventricular tachycardia.  In the last 6  months these have occurred 4 times.  The most recent one was on October 31 and does not appear to coincide with his chest thumping sensation.  It only consisted of 70 beats at around 187 bpm.  Over the last many years he has had brief episodes of nonsustained ventricular tachycardia that occur about once every 2 months.  Previous workup for ischemia in response to the VT did not lead to any abnormalities.  His device has not recorded any atrial fibrillation.  He has a longstanding history of cardiac problems. He had CABG in 1990 (sequential LIMA to LAD and diagonal), multiple RCA PCI 1990-2004 and a redo CABG in 2004 (SVG-RCA-PDA, with surgical MAZE procedure, Dr. Amador Cunas). Cath 2007 and 2012 show occluded LAD and RCA with patent grafts, 60% ostial OM and diffuse stenoses distal LCX. 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 He had a generator changeout and ventricular lead revision in January 2014. He does not tolerate VVI pacing. He has had occasional nonsustained VT recorded by the device, always asymptomatic. He has had paroxysmal atrial fibrillation. No history of stroke or TIA. Warfarin was stopped for recurrent GI bleeding requiring transfusions. Despite stopping warfarin anticoagulation, he had another episode of GI bleeding requiring transfusion in April 2015. He was seen at [facility] and underwent repeat endoscopy (endoscopic workup in  February was negative). He tells me they found "2 small holes in his stomach" which were cauterized. He has not had any bleeding events since. He remains off anti-coagulation.  He wears CPAP for OSA, has insulin requiring type II DM, mild dyslipidemia intolerant to all statins, post-polio syndrome, lumbar spine surgery 01/2012.  He does not trust nuclear stress tests - reports they were repeatedly  wrong in the past. [name] told him he should always have a cath rather than a stress test.      Past Medical History:  Diagnosis Date  . A-fib ([place])   . Anticoagulant long-term use, on coumadin for PAF 03/08/2012  . Chronic back pain   . Complete heart block ([place])   . Coronary artery disease    seen at [facility] cardiology   . Diabetes mellitus   . Hx of echocardiogram 06/29/2008   EF 45-50%   . Hypertension    Dr. Veronia Beets, primary  . Neuromuscular disorder ([place])    carpal tunnel bilaterally  . 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 Polio    hx of  . Sleep apnea    cpap, 11, last sleep study Jan2013, sees Dr. Keturah Barre  . Status post placement of cardiac pacemaker, 03/07/12 New Medtronic Gen and new Vent lead original placed 2008 03/08/2012    Past Surgical History:  Procedure Laterality Date  . APPENDECTOMY    . BACK SURGERY    . CARDIAC CATHETERIZATION    . CORONARY ARTERY BYPASS GRAFT  1990  . CORONARY ARTERY BYPASS GRAFT  2004  . ESOPHAGOGASTRODUODENOSCOPY (EGD) Left 03/18/2013   Performed by Cleotis Nipper, MD at [place]  . FRACTURE SURGERY     Hx: of right leg  . HERNIA REPAIR    . INSERT / REPLACE / REMOVE PACEMAKER  03/07/2012   replaced  . JOINT REPLACEMENT     Hx: of left knee  . Laminectomy and Foraminotomy - Lumbar two-lumbar three- Lumbar four-lumbar five- left with removal hardware lumbar three-four Left 05/18/2016   Performed by [name], MD at [place]  . LEFT HEART CATHETERIZATION WITH CORONARY/GRAFT ANGIOGRAM N/A 12/21/2010   Performed by Glenetta Hew, MD at [facility] CATH LAB  . LUMBAR LAMINECTOMY/DECOMPRESSION MICRODISCECTOMY 1 LEVEL Bilateral 01/10/2012   Performed by [name], MD at [facility] NEURO ORS  . Lumbar three-four MAXIMUM ACCESS (MAS) POSTERIOR LUMBAR INTERBODY FUSION (PLIF) 1 LEVEL, REMOVAL OF AFFIX PLATE N/A 5/00/9381   Performed by [name], MD at [facility] NEURO ORS  . MAZE Procedure  2004  . NASAL SEPTUM SURGERY N/A 14    . PACEMAKER INSERTION  08, 1/14  . PACEMAKER REVISION N/A 03/07/2012   Performed by Sanda Klein, MD at [facility] CATH LAB  . POSTERIOR FUSION LUMBAR SPINE  08/28/2012   Dr Ronnald Ramp  . Removal of broken hardware Lumbar three-four N/A 05/18/2016   Performed by [name], MD at [place] ARTHROSCOPY  OPEN ROTATOR CUFF REPAIR Right 03/30/2014   Performed by Kerin Salen, MD at [place]  . ROTATOR CUFF REPAIR SHOULDER OPEN Right 03/30/2014   Performed by Kerin Salen, MD at [place]    . TONSILLECTOMY      Current Medications: Outpatient Medications Prior to Visit  Medication Sig Dispense Refill  . acetaminophen (TYLENOL) 500 MG tablet Take 500 mg by mouth 2 (two) times daily as needed (for aches/pain.).    [name] Kitchen amLODipine (  NORVASC) 5 MG tablet Take 1 tablet (5 mg total) by mouth daily. 90 tablet 3  . amoxicillin-clavulanate (AUGMENTIN) 875-125 MG tablet Take 1 tablet 2 (two) times daily by mouth.    [name] Kitchen aspirin EC 81 MG tablet Take 81 mg by mouth daily.    . carvedilol (COREG) 25 MG tablet TAKE TWO TABLETS BY MOUTH TWICE DAILY 360 tablet 3  . colchicine 0.6 MG tablet Take 1 tablet (0.6 mg total) by mouth 2 (two) times daily as needed. for gout flareup (Patient taking differently: Take 0.6 mg by mouth 2 (two) times daily. for gout flareup) 60 tablet 3  . Cyanocobalamin (VITAMIN B-12 PO) Take 5,000 mcg by mouth daily.     . diclofenac sodium (VOLTAREN) 1 % GEL Apply 1 g topically 2 (two) times daily as needed (for back pain/aches or pain.).     [name] Kitchen glipiZIDE (GLUCOTROL) 10 MG tablet Take 10 mg by mouth 2 (two) times daily. For high blood sugars    . lisinopril (PRINIVIL,ZESTRIL) 40 MG tablet Take 40 mg daily by mouth.    [name] Kitchen lisinopril-hydrochlorothiazide (PRINZIDE,ZESTORETIC) 20-25 MG tablet TAKE ONE TABLET BY MOUTH ONCE DAILY (Patient taking differently: TAKE ONE TABLET BY MOUTH ONCE DAILY IN THE MORNING) 90 tablet 3  . metFORMIN (GLUCOPHAGE) 500 MG tablet Take 500 mg by mouth 4  (four) times daily.     . ONE TOUCH ULTRA TEST test strip     . metFORMIN (GLUCOPHAGE) 500 MG tablet TAKE ONE TABLET BY MOUTH 4 TIMES DAILY    . allopurinol (ZYLOPRIM) 100 MG tablet Take 1 tablet (100 mg total) by mouth 2 (two) times daily. 60 tablet 3   No facility-administered medications prior to visit.      Allergies:   No known allergies   Social History   Socioeconomic History  . Marital status: Single    Spouse name: None  . Number of children: 1  . Years of education: 71  . Highest education level: None  Social Needs  . Financial resource strain: None  . Food insecurity - worry: None  . Food insecurity - inability: None  . Transportation needs - medical: None  . Transportation needs - non-medical: None  Occupational History  . Occupation: pilot-retired  Tobacco Use  . Smoking status: Never Smoker  . Smokeless tobacco: Never Used  Substance and Sexual Activity  . Alcohol use: Yes    Comment: rarely  . Drug use: No  . Sexual activity: Yes  Other Topics Concern  . None  Social History Narrative   [place], [place].    Right-handed.   1 cup caffeine per day.     Family History:  The patient's family history includes Cancer in his mother; Other in his father.   ROS:   Please see the history of present illness.    ROS All other systems reviewed and are negative.   PHYSICAL EXAM:   VS:  BP (!) 156/70   Pulse 63   Ht 5\' 10"  (1.778 m)   Wt 226 lb 6.4 oz (102.7 kg)   BMI 32.49 kg/m    GEN: Well nourished, well developed, in no acute distress  HEENT: normal  Neck: no JVD, carotid bruits, or masses Cardiac: Widely split second heart sound, RRR; no murmurs, rubs, or gallops,no edema , healthy subclavian pacemaker site and sternotomy scar Respiratory:  clear to auscultation bilaterally, normal work of breathing GI: soft, nontender, nondistended, + BS MS: no deformity  or atrophy  Skin: warm and dry, no rash Neuro:  Alert and Oriented  x 3, Strength and sensation are intact Psych: euthymic mood, full affect  Wt Readings from Last 3 Encounters:  12/22/16 226 lb 6.4 oz (102.7 kg)  07/04/16 220 lb (99.8 kg)  06/26/16 223 lb (101.2 kg)      Studies/Labs Reviewed:   EKG:  EKG is ordered today.  Shows atrial paced ventricular sensed rhythm with right bundle branch block.  QTc normal at 429 ms Recent Labs: 05/01/2016: ALT 17 05/09/2016: BUN 30; Creatinine, Ser 1.49; Hemoglobin 12.1; Platelets 189; Potassium 4.6; Sodium 138   Lipid Panel    Component Value Date/Time   CHOL 134 12/20/2010 0903   TRIG 104 12/20/2010 0903   HDL 38 (L) 12/20/2010 0903   CHOLHDL 3.5 12/20/2010 0903   VLDL 21 12/20/2010 0903   LDLCALC 75 12/20/2010 0903    Additional studies/ records that were reviewed today include:  Notes from Dr. Tally Due    ASSESSMENT:    1. 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 Paroxysmal atrial fibrillation ([place])   3. SSS (sick sinus syndrome) ([place])   4. Non-sustained ventricular tachycardia ([place])   5. Pacemaker - Medtronic Adapta dual chamber   6. Essential hypertension   7. Diabetes mellitus type 2 in obese ([place])   8. Mixed hyperlipidemia   9. Mild obesity      PLAN:  In order of problems listed above:  1. CAD s/p redo CABG with exertional angina: He does not have angina pectoris while taking a beta-blocker intermittently taking his amlodipine. 2. AFib: This has been reported in the past but has not recurred in years.  He has never had an embolic event. CHADSVasc score 4 (Age, vascular disease, diabetes mellitus). Anticoagulation has been withheld due to serious GI bleeding. Continue to monitor for recurrence of arrhythmia via his pacemaker. 3. Sinus node arrest/CHB: Reportedly he had complete heart block and the pacemaker was implanted, but his current indication for pacing is clearly only sinus node arrest.  The current signs or settings.  Appropriate 4. NSVT: Episodes are  infrequent, a few times a year, consistently asymptomatic.  I do not think we need to repeat workup. 5. PPM: Continue with remote downloads every 3 months and office visit in 6 months 6. HTN: When he was compliant with his medications his blood pressure was well controlled.  Encouraged him to be compliant with amlodipine, carvedilol and lisinopril-hydrochlorothiazide.  After 2 weeks of appliance with medications he will call us back with some blood pressure 7. DM: Good hemoglobin A1c, 6.8% in April, per his report even better on his most recent test.  He has gained some weight. 8. HLP: He has tried every single available statins and cannot tolerate them. His LDL is only slightly outside target range. Prefers not to use PCSK9 inhibitors due to cost.  9. Obesity: Weight loss recommended as a way to improve glycemic control and scale back his medications.    Medication Adjustments/Labs and Tests Ordered: Current medicines are reviewed at length with the patient today.  Concerns regarding medicines are outlined above.  Medication changes, Labs and Tests ordered today are listed in the Patient Instructions below. Patient Instructions  Dr Sallyanne Kuster recommends that you continue on your current medications as directed. Please refer to the Current Medication list given to you today.  Remote monitoring is used to monitor your Pacemaker of ICD from home. This monitoring reduces  the number of office visits required to check your device to one time per year. It allows [place] to keep an eye on the functioning of your device to ensure it is working properly. You are scheduled for a device check from home on Friday, February 15th, 2019. You may send your transmission at any time that day. If you have a wireless device, the transmission will be sent automatically. After your physician reviews your transmission, you will receive a postcard with your next transmission date.  Dr Sallyanne Kuster recommends that you schedule a  follow-up appointment in 6 months with a pacemaker check. You will receive a reminder letter in the mail two months in advance. If you don't receive a letter, please call our office to schedule the follow-up appointment.  If you need a refill on your cardiac medications before your next appointment, please call your pharmacy.   Take your blood pressure medicines everyday! Take your blood pressure daily! Reduce your salt intake!  Your physician has requested that you regularly monitor your blood pressure at home. Please use the same machine to check your blood pressure daily. Keep a record of your blood pressures using the log sheet provided. In 2 weeks, please report your readings back to Dr C. You may use our online patient portal 'MyChart' or you can call the office to speak with a nurse.     Signed, Sanda Klein, MD  12/22/2016 4:42 PM    [address], [place], [place]  [address] Phone: [phone]; Fax: [phone]

## 2016-12-26 DIAGNOSIS — G4733 Obstructive sleep apnea (adult) (pediatric): Secondary | ICD-10-CM | POA: Diagnosis not present

## 2017-01-04 ENCOUNTER — Other Ambulatory Visit: Payer: Self-pay

## 2017-01-04 DIAGNOSIS — L570 Actinic keratosis: Secondary | ICD-10-CM | POA: Diagnosis not present

## 2017-01-04 DIAGNOSIS — D485 Neoplasm of uncertain behavior of skin: Secondary | ICD-10-CM | POA: Diagnosis not present

## 2017-01-04 DIAGNOSIS — D225 Melanocytic nevi of trunk: Secondary | ICD-10-CM | POA: Diagnosis not present

## 2017-01-08 ENCOUNTER — Other Ambulatory Visit: Payer: Self-pay | Admitting: Neurological Surgery

## 2017-01-08 DIAGNOSIS — R29898 Other symptoms and signs involving the musculoskeletal system: Secondary | ICD-10-CM

## 2017-01-09 ENCOUNTER — Other Ambulatory Visit: Payer: Self-pay | Admitting: Cardiovascular Disease

## 2017-01-11 ENCOUNTER — Other Ambulatory Visit: Payer: Self-pay | Admitting: Cardiovascular Disease

## 2017-01-12 ENCOUNTER — Other Ambulatory Visit: Payer: Self-pay

## 2017-01-12 DIAGNOSIS — E118 Type 2 diabetes mellitus with unspecified complications: Secondary | ICD-10-CM | POA: Diagnosis not present

## 2017-01-12 DIAGNOSIS — R0602 Shortness of breath: Secondary | ICD-10-CM | POA: Diagnosis not present

## 2017-01-12 DIAGNOSIS — I10 Essential (primary) hypertension: Secondary | ICD-10-CM | POA: Diagnosis not present

## 2017-01-12 DIAGNOSIS — E1165 Type 2 diabetes mellitus with hyperglycemia: Secondary | ICD-10-CM | POA: Diagnosis not present

## 2017-01-12 NOTE — Telephone Encounter (Signed)
Rx(s) sent to pharmacy electronically.  

## 2017-01-18 ENCOUNTER — Ambulatory Visit
Admission: RE | Admit: 2017-01-18 | Discharge: 2017-01-18 | Disposition: A | Discharge status: Home / Self Care | Payer: PPO | Source: Ambulatory Visit | Attending: Neurological Surgery | Admitting: Neurological Surgery

## 2017-01-18 DIAGNOSIS — M5126 Other intervertebral disc displacement, lumbar region: Secondary | ICD-10-CM | POA: Diagnosis not present

## 2017-01-18 DIAGNOSIS — R29898 Other symptoms and signs involving the musculoskeletal system: Secondary | ICD-10-CM

## 2017-01-18 MED ORDER — IOPAMIDOL (ISOVUE-M 200) INJECTION 41%
15.0000 mL | Freq: Once | INTRAMUSCULAR | Status: AC
  Administered 2017-01-18: 15 mL via INTRATHECAL

## 2017-01-18 MED ORDER — DIAZEPAM 5 MG PO TABS
5.0000 mg | ORAL_TABLET | Freq: Once | ORAL | Status: AC
  Administered 2017-01-18: 5 mg via ORAL

## 2017-01-18 NOTE — Progress Notes (Signed)
Pt states he has been off Tramadol for the last 2 weeks.

## 2017-01-18 NOTE — Discharge Instructions (Signed)

## 2017-01-22 ENCOUNTER — Other Ambulatory Visit: Payer: Self-pay | Admitting: Neurological Surgery

## 2017-01-22 DIAGNOSIS — I10 Essential (primary) hypertension: Secondary | ICD-10-CM | POA: Diagnosis not present

## 2017-01-22 DIAGNOSIS — Z6834 Body mass index (BMI) 34.0-34.9, adult: Secondary | ICD-10-CM | POA: Diagnosis not present

## 2017-01-22 DIAGNOSIS — M48061 Spinal stenosis, lumbar region without neurogenic claudication: Secondary | ICD-10-CM | POA: Diagnosis not present

## 2017-01-23 ENCOUNTER — Telehealth: Payer: Self-pay

## 2017-01-23 NOTE — Telephone Encounter (Signed)
   Primary Cardiologist: Sanda Klein, MD  Chart reviewed as part of pre-operative protocol coverage. Given past medical history and time since last visit, based on ACC/AHA guidelines, AAIDEN DEPOY would be at acceptable risk for the planned procedure without further cardiovascular testing. Ideally, he will continue  blocker and ASA therapy throughout the perioperative period.  Please call with questions.  Murray Hodgkins, NP 01/23/2017, 3:04 PM

## 2017-01-23 NOTE — Telephone Encounter (Signed)
       [facility] Pre-operative Risk Assessment    Request for surgical clearance:   Dr Sallyanne Kuster  1. What type of surgery is being performed? L2-3 Posterior lumbar interbody fusion   2. When is this surgery scheduled? 02-15-17   3. Are there any medications that need to be held prior to surgery and how long?NONE NOTED   4. Practice name and name of physician performing surgery?  [facility]  5. What is your office phone and fax number? [place] [phone]   6. Anesthesia type (None, local, MAC, general) ? NONE NOTED   Alan [name] 01/23/2017, 11:10 AM  _________________________________________________________________   (provider comments below)

## 2017-01-25 DIAGNOSIS — G4733 Obstructive sleep apnea (adult) (pediatric): Secondary | ICD-10-CM | POA: Diagnosis not present

## 2017-01-26 ENCOUNTER — Other Ambulatory Visit: Payer: Self-pay

## 2017-01-26 DIAGNOSIS — C44629 Squamous cell carcinoma of skin of left upper limb, including shoulder: Secondary | ICD-10-CM | POA: Diagnosis not present

## 2017-01-26 DIAGNOSIS — L570 Actinic keratosis: Secondary | ICD-10-CM | POA: Diagnosis not present

## 2017-01-31 DIAGNOSIS — E118 Type 2 diabetes mellitus with unspecified complications: Secondary | ICD-10-CM | POA: Diagnosis not present

## 2017-01-31 DIAGNOSIS — E1165 Type 2 diabetes mellitus with hyperglycemia: Secondary | ICD-10-CM | POA: Diagnosis not present

## 2017-02-05 ENCOUNTER — Telehealth: Payer: Self-pay | Admitting: *Deleted

## 2017-02-05 NOTE — Telephone Encounter (Signed)
   [facility] Pre-operative Risk Assessment    Request for surgical clearance:  1. What type of surgery is being performed? L2-3 Posterior Lumbar interbody fusion   2. When is this surgery scheduled? 02/15/17   3. Are there any medications that need to be held prior to surgery and how long?   4. Practice name and name of physician performing surgery? [facility], Dr. Sherley Bounds   5. What is your office phone and fax number? [phone]   6. Anesthesia type (None, local, MAC, general) ?    Alan [name] A Alan [name] 02/05/2017, 1:58 PM  _________________________________________________________________   (provider comments below)   Refaxed clearance note from 12/18 to fax # provided at [facility]

## 2017-02-09 ENCOUNTER — Telehealth: Payer: Self-pay | Admitting: Cardiovascular Disease

## 2017-02-09 ENCOUNTER — Other Ambulatory Visit (HOSPITAL_COMMUNITY): Payer: Self-pay | Admitting: *Deleted

## 2017-02-09 NOTE — Pre-Procedure Instructions (Signed)
THOM OLLINGER  02/09/2017    Your procedure is scheduled on Thursday, February 15, 2017 at 10:00 AM.   Report to [facility] Entrance "A" Admitting Office at 8:00 AM.   Call this number if you have problems the morning of surgery: [phone]   Questions prior to day of surgery, please call [phone] between 8 & 4 PM.   Remember:  Do not eat food or drink liquids after midnight Wednesday, 02/14/17.  Take these medicines the morning of surgery with A SIP OF WATER: Amlodipine (Norvasc), Carvedilol (Coreg) Do not take Metformin (Glucaphage) or Glipizide (Glucotrol) the morning of surgery.  Stop NSAIDS (Diclofenac, Voltaren, Ibuprofen, Aleve, etc) as of today.    How to Manage Your Diabetes Before Surgery   Why is it important to control my blood sugar before and after surgery?   Improving blood sugar levels before and after surgery helps healing and can limit problems.  A way of improving blood sugar control is eating a healthy diet by:  - Eating less sugar and carbohydrates  - Increasing activity/exercise  - Talk with your doctor about reaching your blood sugar goals  High blood sugars (greater than 180 mg/dL) can raise your risk of infections and slow down your recovery so you will need to focus on controlling your diabetes during the weeks before surgery.  Make sure that the doctor who takes care of your diabetes knows about your planned surgery including the date and location.  How do I manage my blood sugars before surgery?   Check your blood sugar at least 4 times a day, 2 days before surgery to make sure that they are not too high or low.  Check your blood sugar the morning of your surgery when you wake up and every 2 hours until you get to the Short-Stay unit.  Treat a low blood sugar (less than 70 mg/dL) with 1/2 cup of clear juice (cranberry or apple), 4 glucose tablets, OR glucose gel.  Recheck blood sugar in 15 minutes after treatment (to make sure it is  greater than 70 mg/dL).  If blood sugar is not greater than 70 mg/dL on re-check, call [phone] for further instructions.   Report your blood sugar to the Short-Stay nurse when you get to Short-Stay.  References:  [name], 2007 "How to Manage your Diabetes Before and After Surgery".   Do not wear jewelry.  Do not wear lotions, powders, cologne or deodorant.  Men may shave face and neck.  Do not bring valuables to the hospital.  [facility] is not responsible for any belongings or valuables.  Contacts, dentures or bridgework may not be worn into surgery.  Leave your suitcase in the car.  After surgery it may be brought to your room.  For patients admitted to the hospital, discharge time will be determined by your treatment team.  [facility] - Preparing for Surgery  Before surgery, you can play an important role.  Because skin is not sterile, your skin needs to be as free of germs as possible.  You can reduce the number of germs on you skin by washing with CHG (chlorahexidine gluconate) soap before surgery.  CHG is an antiseptic cleaner which kills germs and bonds with the skin to continue killing germs even after washing.  Please DO NOT use if you have an allergy to CHG or antibacterial soaps.  If your skin becomes reddened/irritated stop using the CHG and inform your nurse when you  arrive at Short Stay.  Do not shave (including legs and underarms) for at least 48 hours prior to the first CHG shower.  You may shave your face.  Please follow these instructions carefully:   1.  Shower with CHG Soap the night before surgery and the                    morning of Surgery.  2.  If you choose to wash your hair, wash your hair first as usual with your       normal shampoo.  3.  After you shampoo, rinse your hair and body thoroughly to remove the shampoo.  4.  Use CHG as you would any other liquid soap.  You can apply chg directly       to the skin and wash  gently with scrungie or a clean washcloth.  5.  Apply the CHG Soap to your body ONLY FROM THE NECK DOWN.        Do not use on open wounds or open sores.  Avoid contact with your eyes, ears, mouth and genitals (private parts).  Wash genitals (private parts) with your normal soap.  6.  Wash thoroughly, paying special attention to the area where your surgery        will be performed.  7.  Thoroughly rinse your body with warm water from the neck down.  8.  DO NOT shower/wash with your normal soap after using and rinsing off       the CHG Soap.  9.  Pat yourself dry with a clean towel.            10.  Wear clean pajamas.            11.  Place clean sheets on your bed the night of your first shower and do not        sleep with pets.  Day of Surgery  Shower as above. Do not apply any lotions/deodorants the morning of surgery.  Please wear clean clothes to the hospital.   Please read over the fact sheets that you were given.

## 2017-02-09 NOTE — Telephone Encounter (Signed)
   Primary Cardiologist: Sanda Klein, MD  Chart reviewed as part of pre-operative protocol coverage.  The patient was already cleared by Murray Hodgkins, NP on 01/23/17 "Given past medical history and time since last visit, based on ACC/AHA guidelines, SAFWAN TOMEI would be at acceptable risk for the planned procedure without further cardiovascular testing. Ideally, he will continue ? blocker and ASA therapy throughout the perioperative period".  I will route this recommendation to the requesting party via Epic fax function and remove from pre-op pool.  Please call with questions.  [place], [place] 02/09/2017, 2:00 PM

## 2017-02-09 NOTE — Telephone Encounter (Signed)
° °  [facility] Pre-operative Risk Assessment    Request for surgical clearance:  1. What type of surgery is being performed? Back surgery  2. When is this surgery scheduled? 02-15-17  3. Are there any medications that need to be held prior to surgery and how long?  Practice name and name of physician performing surgery?[facility]  Sherley Bounds  4. What is your office phone and fax number? Office # [phone]  5. Anesthesia type (None, local, MAC, general) ? unknown   Marjean Donna 02/09/2017, 8:19 AM  _________________________________________________________________   (provider comments below)

## 2017-02-12 ENCOUNTER — Encounter (HOSPITAL_COMMUNITY): Payer: Self-pay

## 2017-02-12 ENCOUNTER — Encounter (HOSPITAL_COMMUNITY)
Admission: RE | Admit: 2017-02-12 | Discharge: 2017-02-12 | Disposition: A | Discharge status: Home / Self Care | Payer: Medicare HMO | Source: Ambulatory Visit | Attending: Neurological Surgery | Admitting: Neurological Surgery

## 2017-02-12 ENCOUNTER — Other Ambulatory Visit: Payer: Self-pay

## 2017-02-12 DIAGNOSIS — I2510 Atherosclerotic heart disease of native coronary artery without angina pectoris: Secondary | ICD-10-CM | POA: Diagnosis not present

## 2017-02-12 DIAGNOSIS — D649 Anemia, unspecified: Secondary | ICD-10-CM | POA: Diagnosis not present

## 2017-02-12 DIAGNOSIS — M48061 Spinal stenosis, lumbar region without neurogenic claudication: Secondary | ICD-10-CM | POA: Diagnosis not present

## 2017-02-12 DIAGNOSIS — Z951 Presence of aortocoronary bypass graft: Secondary | ICD-10-CM | POA: Diagnosis not present

## 2017-02-12 DIAGNOSIS — Z794 Long term (current) use of insulin: Secondary | ICD-10-CM | POA: Diagnosis not present

## 2017-02-12 DIAGNOSIS — Z539 Procedure and treatment not carried out, unspecified reason: Secondary | ICD-10-CM | POA: Diagnosis present

## 2017-02-12 DIAGNOSIS — E1165 Type 2 diabetes mellitus with hyperglycemia: Secondary | ICD-10-CM | POA: Diagnosis not present

## 2017-02-12 DIAGNOSIS — I10 Essential (primary) hypertension: Secondary | ICD-10-CM | POA: Diagnosis not present

## 2017-02-12 HISTORY — DX: Personal history of urinary calculi: Z87.442

## 2017-02-12 LAB — BASIC METABOLIC PANEL
Anion gap: 10 (ref 5–15)
BUN: 28 mg/dL — ABNORMAL HIGH (ref 6–20)
CALCIUM: 9.7 mg/dL (ref 8.9–10.3)
CO2: 22 mmol/L (ref 22–32)
CREATININE: 1.54 mg/dL — AB (ref 0.61–1.24)
Chloride: 102 mmol/L (ref 101–111)
GFR calc non Af Amer: 42 mL/min — ABNORMAL LOW (ref >60)
GFR, EST AFRICAN AMERICAN: 49 mL/min — AB (ref >60)
Glucose, Bld: 186 mg/dL — ABNORMAL HIGH (ref 65–99)
Potassium: 4.6 mmol/L (ref 3.5–5.1)
SODIUM: 134 mmol/L — AB (ref 135–145)

## 2017-02-12 LAB — TYPE AND SCREEN
ABO/RH(D): A POS
ANTIBODY SCREEN: NEGATIVE

## 2017-02-12 LAB — CBC
HCT: 35.5 % — ABNORMAL LOW (ref 39.0–52.0)
Hemoglobin: 11.4 g/dL — ABNORMAL LOW (ref 13.0–17.0)
MCH: 30 pg (ref 26.0–34.0)
MCHC: 32.1 g/dL (ref 30.0–36.0)
MCV: 93.4 fL (ref 78.0–100.0)
PLATELETS: 163 10*3/uL (ref 150–400)
RBC: 3.8 MIL/uL — ABNORMAL LOW (ref 4.22–5.81)
RDW: 12.9 % (ref 11.5–15.5)
WBC: 6.4 10*3/uL (ref 4.0–10.5)

## 2017-02-12 LAB — HEMOGLOBIN A1C
HEMOGLOBIN A1C: 6.4 % — AB (ref 4.8–5.6)
Mean Plasma Glucose: 136.98 mg/dL

## 2017-02-12 LAB — SURGICAL PCR SCREEN
MRSA, PCR: NEGATIVE
STAPHYLOCOCCUS AUREUS: NEGATIVE

## 2017-02-12 LAB — GLUCOSE, CAPILLARY: Glucose-Capillary: 174 mg/dL — ABNORMAL HIGH (ref 65–99)

## 2017-02-12 NOTE — Progress Notes (Addendum)
Pt has cardiac clearance noted in Epic 01/23/17 by Dr. Sallyanne Kuster. Pt denies any recent chest pain or sob. Pt is diabetic. Last A1C was 6.6 on 12/07/16. Pt states his fasting blood sugar usually runs around 150-160. Pt states he quit taking his Aspirin in the past month or two. In the cardiac clearance, Dr. Sallyanne Kuster stated that pt was to stay on Aspirin throughout the peri-operative period. Pt states he stopped it because it was causing him to bleed too much.

## 2017-02-13 ENCOUNTER — Inpatient Hospital Stay (HOSPITAL_COMMUNITY): Payer: Medicare HMO | Admitting: Anesthesiology

## 2017-02-13 ENCOUNTER — Inpatient Hospital Stay (HOSPITAL_COMMUNITY): Payer: Medicare HMO | Admitting: Emergency Medicine

## 2017-02-13 NOTE — Progress Notes (Signed)
Anesthesia Chart Review: Patient is a 77 year old male scheduled for L2-3 PLIF on 02/15/17 by Dr. Sherley Bounds.  History includes non-smoker, CAD (s/p CABG 1990 [LIMA-LAD-DIAG]; multiple RCA PCI 1990-2004; redo CABG [SVG-RCA-PDA] with MAZE 2004, Dr. Amador Cunas), complete heart block (s/p Medtronic PPM 03/16/06 with generator change 03/07/12), OSA (s/p UPPP with CPAP use), afib (DCCV 06/23/10; warfarin d/c'd 2015 d/t GIB; although recurrent bleed off anticoagulants s/p cauterization gastric lesion ~ 05/2013), post-polio syndrome (effecting LLE), DM2, HTN, left TKA 16/10/96, umbilical hernia repair 05/12/38, right rotator cuff repair 03/30/14, back surgeries (L3-4 laminectomy 01/10/12; L3-4 PLIF 07/2012; removal L3-4 posterior instrumentation and decompressive hemilaminectomy/foraminotomoy L2-3/L4-5 05/18/16).   - PCP is Dr. [name] at [facility] ([place]). - Primary cardiologist is Dr. Sanda Klein, last visit 12/22/16. PPM was functioning normally at that visit. He wrote, "He is 'atrially dependent' without any detectable atrial activity and with 99.7% atrial pacing.  He has normal AV conduction and has only about 1% ventricular pacing.  Heart rate histograms are favorable.[name] KitchenMarland KitchenOver the last many years he has had brief episodes of nonsustained ventricular tachycardia that occur about once every 2 months.  Previous workup for ischemia in response to the VT did not lead to any abnormalities.  His device has not recorded any atrial fibrillation." No new cardiac testing recommended. Telephone encounter from Murray Hodgkins, NP on 01/23/17, "Given past medical history and time since last visit, based on ACC/AHA guidelines,Jonthan M Bayneswould be at acceptable risk for the planned procedure without further cardiovascular testing. Ideally, he will continue?blocker and ASA therapy throughout the perioperative period." - Neurologist is Dr. Star Age ([facility]).  Meds include  amlodipine, Coreg, glipizide, lisinopril-HCTZ, metformin.   BP (!) 128/50 Comment: rechecked  Pulse 78   Temp 36.7 C   Resp 20   Ht 5\' 10"  (1.778 m)   Wt 226 lb 4.8 oz (102.6 kg)   SpO2 97%   BMI 32.47 kg/m   EKG 12/22/16: Atrial paced rhythm with prolonged AV conduction, right BBB.  Echo 11/04/15: Conclusions: - Left ventricle: The cavity size was normal. Wall thickness was   normal. Systolic function was normal. The estimated ejection   fraction was in the range of 55% to 60%. Wall motion was normal;   there were no regional wall motion abnormalities. Features are   consistent with a pseudonormal left ventricular filling pattern,   with concomitant abnormal relaxation and increased filling   pressure (grade 2 diastolic dysfunction). - Mitral valve: Calcified annulus. There was trivial regurgitation. - Left atrium: The atrium was moderately dilated. Volume, ES,   (1-plane Simpson&'s, A2C): 45.2 ml. - Right ventricle: The cavity size was mildly dilated. Wall   thickness was normal. Pacer wire or catheter noted in right   ventricle. - Right atrium: The atrium was mildly dilated. Pacer wire or   catheter noted in right atrium.  Cardiac cath 12/21/10: Impression: 1. Widely patent LIMA-D1-LAD with excellent distal LAD and diagonal flow.  2. Widely patent SVG-RPDA with retrograde filling to the RPL system and native RCA demonstrating diffuse in-stent stenosis of RCA stent.  3. Known subtotal occlusion of RPDA beyond SVG.  4. Native circumflex was large OM 2 small moderate OM1 and remaining vessel moderate disease, most significant being ostial OM1 60% in Xarelto <2 mm vessel. This appeared angiographically similar to prior angiography in 2011.  5. Left Ventriculography: EF 50-55%, no obvious wall motion abnormalities.  Carotid U/S 01/19/16: Impression:  This study is negative  for hemodynamically significant stenosis involving extracranial carotid and vertebral arteries  bilaterally.  CXR 05/09/16: IMPRESSION: No edema or consolidation. Status post pacemaker placement and coronary artery bypass grafting. Aortic atherosclerosis.  Preoperative labs noted. K 4.6, Cr 1.54 (previously 1.53 01/13/17 at [place], 1.49 05/09/16), BUN 28. H/H 11.4/35.5. PLT 163K. Glucose 186. A1c 6.4%. T&S done.   If no acute changes in his CV status and otherwise no new issues then I would anticipate that he can proceed as planned. PPM perioperative device RX is still pending from Dr. Victorino December office.  George Hugh [facility] Short Stay Center/Anesthesiology Phone [phone] 02/13/2017 5:32 PM

## 2017-02-14 NOTE — Progress Notes (Signed)
Spoke with Erlene Quan  From [employer] informed him of surgery date and time.

## 2017-02-15 ENCOUNTER — Inpatient Hospital Stay (HOSPITAL_COMMUNITY)
Admission: RE | Disposition: A | Discharge status: Home / Self Care | Payer: Self-pay | Source: Ambulatory Visit | Attending: Neurological Surgery

## 2017-02-15 ENCOUNTER — Ambulatory Visit (HOSPITAL_COMMUNITY)
Admission: RE | Admit: 2017-02-15 | Discharge: 2017-02-15 | Disposition: A | Discharge status: Home / Self Care | Payer: Medicare HMO | Source: Ambulatory Visit | Attending: Neurological Surgery | Admitting: Neurological Surgery

## 2017-02-15 ENCOUNTER — Encounter (HOSPITAL_COMMUNITY): Payer: Self-pay | Admitting: Urology

## 2017-02-15 DIAGNOSIS — M48061 Spinal stenosis, lumbar region without neurogenic claudication: Secondary | ICD-10-CM | POA: Diagnosis not present

## 2017-02-15 DIAGNOSIS — Z539 Procedure and treatment not carried out, unspecified reason: Secondary | ICD-10-CM | POA: Insufficient documentation

## 2017-02-15 DIAGNOSIS — I10 Essential (primary) hypertension: Secondary | ICD-10-CM | POA: Insufficient documentation

## 2017-02-15 DIAGNOSIS — E1165 Type 2 diabetes mellitus with hyperglycemia: Secondary | ICD-10-CM | POA: Insufficient documentation

## 2017-02-15 DIAGNOSIS — Z794 Long term (current) use of insulin: Secondary | ICD-10-CM | POA: Insufficient documentation

## 2017-02-15 DIAGNOSIS — Z951 Presence of aortocoronary bypass graft: Secondary | ICD-10-CM | POA: Insufficient documentation

## 2017-02-15 DIAGNOSIS — D649 Anemia, unspecified: Secondary | ICD-10-CM | POA: Insufficient documentation

## 2017-02-15 DIAGNOSIS — I2510 Atherosclerotic heart disease of native coronary artery without angina pectoris: Secondary | ICD-10-CM | POA: Insufficient documentation

## 2017-02-15 LAB — GLUCOSE, CAPILLARY: Glucose-Capillary: 111 mg/dL — ABNORMAL HIGH (ref 65–99)

## 2017-02-15 SURGERY — POSTERIOR LUMBAR FUSION 1 LEVEL
Anesthesia: General | Site: Back

## 2017-02-15 MED ORDER — PROPOFOL 10 MG/ML IV BOLUS
INTRAVENOUS | Status: AC
  Filled 2017-02-15: qty 20

## 2017-02-15 MED ORDER — LACTATED RINGERS IV SOLN
INTRAVENOUS | Status: DC
  Administered 2017-02-15: 10:00:00 via INTRAVENOUS

## 2017-02-15 MED ORDER — CHLORHEXIDINE GLUCONATE CLOTH 2 % EX PADS: 6.0000 | MEDICATED_PAD | Freq: Once | CUTANEOUS | Status: DC

## 2017-02-15 MED ORDER — CEFAZOLIN SODIUM-DEXTROSE 2-4 GM/100ML-% IV SOLN: 2.0000 g | INTRAVENOUS | Status: DC

## 2017-02-15 MED ORDER — CEFAZOLIN SODIUM-DEXTROSE 2-4 GM/100ML-% IV SOLN
INTRAVENOUS | Status: AC
  Filled 2017-02-15: qty 100

## 2017-02-15 MED ORDER — ROCURONIUM BROMIDE 10 MG/ML (PF) SYRINGE
PREFILLED_SYRINGE | INTRAVENOUS | Status: AC
  Filled 2017-02-15: qty 5

## 2017-02-15 MED ORDER — LIDOCAINE 2% (20 MG/ML) 5 ML SYRINGE
INTRAMUSCULAR | Status: AC
  Filled 2017-02-15: qty 5

## 2017-02-15 MED ORDER — FENTANYL CITRATE (PF) 250 MCG/5ML IJ SOLN
INTRAMUSCULAR | Status: AC
Start: 2017-02-15 — End: 2017-02-15
  Filled 2017-02-15: qty 5

## 2017-02-15 NOTE — Progress Notes (Signed)
Patient's IV removed, clean, dry, and intact.  Patient ambulated to main entrance with wife and nurse tech.  Patient discharged home with wife.

## 2017-02-15 NOTE — Anesthesia Preprocedure Evaluation (Deleted)
Anesthesia Evaluation  Patient identified by MRN, date of birth, ID band Patient awake    Reviewed: Allergy & Precautions, NPO status , Patient's Chart, lab work & pertinent test results, reviewed documented beta blocker date and time   History of Anesthesia Complications Negative for: history of anesthetic complications  Airway Mallampati: II  TM Distance: >3 FB Neck ROM: Full    Dental  (+) Teeth Intact, Dental Advisory Given   Pulmonary sleep apnea and Continuous Positive Airway Pressure Ventilation ,    breath sounds clear to auscultation       Cardiovascular hypertension, Pt. on medications + CAD and + CABG (re do 2004)  + dysrhythmias Atrial Fibrillation + pacemaker (dual chamber)  Rhythm:Regular  ECHO 2015 EF 60%, followed by cardiology regulary   Neuro/Psych PSYCHIATRIC DISORDERS  Neuromuscular disease    GI/Hepatic negative GI ROS, Neg liver ROS,   Endo/Other  diabetes, Poorly Controlled, Type 2, Insulin Dependent  Renal/GU Renal InsufficiencyRenal diseaseGFR 50     Musculoskeletal   Abdominal (+)  Abdomen: soft.    Peds  Hematology  (+) anemia , 12/38   Anesthesia Other Findings   Reproductive/Obstetrics                             Anesthesia Physical  Anesthesia Plan  ASA: III  Anesthesia Plan: General   Post-op Pain Management:    Induction: Intravenous  PONV Risk Score and Plan: 1 and Ondansetron and Treatment may vary due to age or medical condition  Airway Management Planned: Oral ETT  Additional Equipment: None  Intra-op Plan:   Post-operative Plan: Extubation in OR  Informed Consent: I have reviewed the patients History and Physical, chart, labs and discussed the procedure including the risks, benefits and alternatives for the proposed anesthesia with the patient or authorized representative who has indicated his/her understanding and acceptance.   Dental  advisory given  Plan Discussed with: CRNA and Surgeon  Anesthesia Plan Comments:         Anesthesia Quick Evaluation

## 2017-02-21 ENCOUNTER — Other Ambulatory Visit: Payer: Self-pay

## 2017-02-21 ENCOUNTER — Encounter (HOSPITAL_COMMUNITY): Payer: Self-pay | Admitting: *Deleted

## 2017-02-21 NOTE — Progress Notes (Signed)
Pt denies SOB and chest pain. Pt has pre-op instructions from PAT appointment. Pt made aware to not take Glipizide tonight and the morning of surgery. Pt made aware to not take Metformin DOS. Pt made aware to check BG every 2 hours prior to arrival to hospital on DOS. Pt made aware to treat a BG < 70 with 4 glucose tabs or glucose gel or 4 ounces of apple or cranberry juice, wait 15 minutes after intervention to recheck BG, if BG remains < 70, call Short Stay unit to speak with a nurse. Pt stated that he has not taken  Aspirin, vitamins, fish oil and herbal medications. Do not take any NSAIDs ie: Ibuprofen, Advil, Naproxen (ALeve), Motrin, Voltaren Gel or any medication containing Aspirin. Peri-op prescription for ICD faxed. Pt verbalized understanding of all pre-op instructions.

## 2017-02-22 ENCOUNTER — Inpatient Hospital Stay (HOSPITAL_COMMUNITY): Payer: Medicare HMO

## 2017-02-22 ENCOUNTER — Inpatient Hospital Stay (HOSPITAL_COMMUNITY): Payer: Medicare HMO | Admitting: Certified Registered Nurse Anesthetist

## 2017-02-22 ENCOUNTER — Inpatient Hospital Stay (HOSPITAL_COMMUNITY)
Admission: RE | Admit: 2017-02-22 | Discharge: 2017-02-23 | DRG: 455 | Disposition: A | Discharge status: Home / Self Care | Payer: Medicare HMO | Source: Ambulatory Visit | Attending: Neurological Surgery | Admitting: Neurological Surgery

## 2017-02-22 ENCOUNTER — Encounter (HOSPITAL_COMMUNITY): Payer: Self-pay | Admitting: Neurological Surgery

## 2017-02-22 ENCOUNTER — Encounter (HOSPITAL_COMMUNITY)
Admission: RE | Disposition: A | Discharge status: Home / Self Care | Payer: Self-pay | Source: Ambulatory Visit | Attending: Neurological Surgery

## 2017-02-22 DIAGNOSIS — G4730 Sleep apnea, unspecified: Secondary | ICD-10-CM | POA: Diagnosis not present

## 2017-02-22 DIAGNOSIS — Z955 Presence of coronary angioplasty implant and graft: Secondary | ICD-10-CM | POA: Diagnosis not present

## 2017-02-22 DIAGNOSIS — I10 Essential (primary) hypertension: Secondary | ICD-10-CM | POA: Diagnosis not present

## 2017-02-22 DIAGNOSIS — E119 Type 2 diabetes mellitus without complications: Secondary | ICD-10-CM | POA: Diagnosis present

## 2017-02-22 DIAGNOSIS — M1990 Unspecified osteoarthritis, unspecified site: Secondary | ICD-10-CM | POA: Diagnosis present

## 2017-02-22 DIAGNOSIS — Z7984 Long term (current) use of oral hypoglycemic drugs: Secondary | ICD-10-CM | POA: Diagnosis not present

## 2017-02-22 DIAGNOSIS — I480 Paroxysmal atrial fibrillation: Secondary | ICD-10-CM | POA: Diagnosis not present

## 2017-02-22 DIAGNOSIS — Z7901 Long term (current) use of anticoagulants: Secondary | ICD-10-CM | POA: Diagnosis not present

## 2017-02-22 DIAGNOSIS — Z79899 Other long term (current) drug therapy: Secondary | ICD-10-CM

## 2017-02-22 DIAGNOSIS — Z981 Arthrodesis status: Secondary | ICD-10-CM

## 2017-02-22 DIAGNOSIS — M5416 Radiculopathy, lumbar region: Secondary | ICD-10-CM | POA: Diagnosis not present

## 2017-02-22 DIAGNOSIS — Z950 Presence of cardiac pacemaker: Secondary | ICD-10-CM | POA: Diagnosis not present

## 2017-02-22 DIAGNOSIS — M48061 Spinal stenosis, lumbar region without neurogenic claudication: Principal | ICD-10-CM | POA: Diagnosis present

## 2017-02-22 DIAGNOSIS — Z951 Presence of aortocoronary bypass graft: Secondary | ICD-10-CM | POA: Diagnosis not present

## 2017-02-22 DIAGNOSIS — Z6832 Body mass index (BMI) 32.0-32.9, adult: Secondary | ICD-10-CM

## 2017-02-22 DIAGNOSIS — Z419 Encounter for procedure for purposes other than remedying health state, unspecified: Secondary | ICD-10-CM

## 2017-02-22 DIAGNOSIS — I2510 Atherosclerotic heart disease of native coronary artery without angina pectoris: Secondary | ICD-10-CM | POA: Diagnosis present

## 2017-02-22 LAB — GLUCOSE, CAPILLARY
GLUCOSE-CAPILLARY: 221 mg/dL — AB (ref 65–99)
Glucose-Capillary: 90 mg/dL (ref 65–99)
Glucose-Capillary: 101 mg/dL — ABNORMAL HIGH (ref 65–99)

## 2017-02-22 SURGERY — POSTERIOR LUMBAR FUSION 1 LEVEL
Anesthesia: General | Site: Spine Lumbar

## 2017-02-22 MED ORDER — 0.9 % SODIUM CHLORIDE (POUR BTL) OPTIME
TOPICAL | Status: DC | PRN
  Administered 2017-02-22: 1000 mL

## 2017-02-22 MED ORDER — INSULIN ASPART 100 UNIT/ML ~~LOC~~ SOLN: 0.0000 [IU] | Freq: Three times a day (TID) | SUBCUTANEOUS | Status: DC

## 2017-02-22 MED ORDER — ONDANSETRON HCL 4 MG PO TABS: 4.0000 mg | ORAL_TABLET | Freq: Four times a day (QID) | ORAL | Status: DC | PRN

## 2017-02-22 MED ORDER — CARVEDILOL 25 MG PO TABS
50.0000 mg | ORAL_TABLET | Freq: Two times a day (BID) | ORAL | Status: DC
  Administered 2017-02-22 – 2017-02-23 (×2): 50 mg via ORAL
  Filled 2017-02-22 (×2): qty 2

## 2017-02-22 MED ORDER — MEPERIDINE HCL 25 MG/ML IJ SOLN: 6.2500 mg | INTRAMUSCULAR | Status: DC | PRN

## 2017-02-22 MED ORDER — ROCURONIUM BROMIDE 10 MG/ML (PF) SYRINGE
PREFILLED_SYRINGE | INTRAVENOUS | Status: DC | PRN
  Administered 2017-02-22: 50 mg via INTRAVENOUS

## 2017-02-22 MED ORDER — METHOCARBAMOL 500 MG PO TABS
500.0000 mg | ORAL_TABLET | Freq: Four times a day (QID) | ORAL | Status: DC | PRN
  Administered 2017-02-22 – 2017-02-23 (×2): 500 mg via ORAL
  Filled 2017-02-22 (×2): qty 1

## 2017-02-22 MED ORDER — INSULIN ASPART 100 UNIT/ML ~~LOC~~ SOLN
0.0000 [IU] | Freq: Every day | SUBCUTANEOUS | Status: DC
  Administered 2017-02-22: 2 [IU] via SUBCUTANEOUS

## 2017-02-22 MED ORDER — SUGAMMADEX SODIUM 200 MG/2ML IV SOLN
INTRAVENOUS | Status: DC | PRN
  Administered 2017-02-22: 300 mg via INTRAVENOUS

## 2017-02-22 MED ORDER — ONDANSETRON HCL 4 MG/2ML IJ SOLN
INTRAMUSCULAR | Status: DC | PRN
  Administered 2017-02-22: 4 mg via INTRAVENOUS

## 2017-02-22 MED ORDER — GLIPIZIDE 10 MG PO TABS
10.0000 mg | ORAL_TABLET | Freq: Two times a day (BID) | ORAL | Status: DC
  Administered 2017-02-22 – 2017-02-23 (×2): 10 mg via ORAL
  Filled 2017-02-22 (×2): qty 1

## 2017-02-22 MED ORDER — LISINOPRIL-HYDROCHLOROTHIAZIDE 20-25 MG PO TABS: 1.0000 | ORAL_TABLET | Freq: Every day | ORAL | Status: DC

## 2017-02-22 MED ORDER — FENTANYL CITRATE (PF) 100 MCG/2ML IJ SOLN: 25.0000 ug | INTRAMUSCULAR | Status: DC | PRN

## 2017-02-22 MED ORDER — OXYCODONE HCL 5 MG PO TABS
10.0000 mg | ORAL_TABLET | ORAL | Status: DC | PRN
  Filled 2017-02-22: qty 2

## 2017-02-22 MED ORDER — VANCOMYCIN HCL 1000 MG IV SOLR
INTRAVENOUS | Status: AC
  Filled 2017-02-22: qty 1000

## 2017-02-22 MED ORDER — MENTHOL 3 MG MT LOZG: 1.0000 | LOZENGE | OROMUCOSAL | Status: DC | PRN

## 2017-02-22 MED ORDER — PHENYLEPHRINE 40 MCG/ML (10ML) SYRINGE FOR IV PUSH (FOR BLOOD PRESSURE SUPPORT)
PREFILLED_SYRINGE | INTRAVENOUS | Status: DC | PRN
  Administered 2017-02-22 (×3): 80 ug via INTRAVENOUS

## 2017-02-22 MED ORDER — DEXTROSE 5 % IV SOLN
INTRAVENOUS | Status: DC | PRN
  Administered 2017-02-22: 20 ug/min via INTRAVENOUS

## 2017-02-22 MED ORDER — SODIUM CHLORIDE 0.9% FLUSH
3.0000 mL | Freq: Two times a day (BID) | INTRAVENOUS | Status: DC
  Administered 2017-02-22: 3 mL via INTRAVENOUS

## 2017-02-22 MED ORDER — AMLODIPINE BESYLATE 5 MG PO TABS
5.0000 mg | ORAL_TABLET | Freq: Every day | ORAL | Status: DC
  Administered 2017-02-23: 5 mg via ORAL
  Filled 2017-02-22: qty 1

## 2017-02-22 MED ORDER — PHENOL 1.4 % MT LIQD: 1.0000 | OROMUCOSAL | Status: DC | PRN

## 2017-02-22 MED ORDER — ACETAMINOPHEN 650 MG RE SUPP: 650.0000 mg | RECTAL | Status: DC | PRN

## 2017-02-22 MED ORDER — FENTANYL CITRATE (PF) 250 MCG/5ML IJ SOLN
INTRAMUSCULAR | Status: DC | PRN
  Administered 2017-02-22: 250 ug via INTRAVENOUS

## 2017-02-22 MED ORDER — FENTANYL CITRATE (PF) 250 MCG/5ML IJ SOLN
INTRAMUSCULAR | Status: AC
  Filled 2017-02-22: qty 5

## 2017-02-22 MED ORDER — METFORMIN HCL 500 MG PO TABS
500.0000 mg | ORAL_TABLET | Freq: Four times a day (QID) | ORAL | Status: DC
  Administered 2017-02-22 – 2017-02-23 (×2): 500 mg via ORAL
  Filled 2017-02-22 (×2): qty 1

## 2017-02-22 MED ORDER — INSULIN ASPART 100 UNIT/ML ~~LOC~~ SOLN
0.0000 [IU] | Freq: Three times a day (TID) | SUBCUTANEOUS | Status: DC
  Administered 2017-02-23: 5 [IU] via SUBCUTANEOUS

## 2017-02-22 MED ORDER — THROMBIN (RECOMBINANT) 5000 UNITS EX SOLR
OROMUCOSAL | Status: DC | PRN
  Administered 2017-02-22: 16:00:00 via TOPICAL

## 2017-02-22 MED ORDER — METHOCARBAMOL 1000 MG/10ML IJ SOLN
500.0000 mg | Freq: Four times a day (QID) | INTRAVENOUS | Status: DC | PRN
  Filled 2017-02-22: qty 5

## 2017-02-22 MED ORDER — PROPOFOL 10 MG/ML IV BOLUS
INTRAVENOUS | Status: DC | PRN
  Administered 2017-02-22: 150 mg via INTRAVENOUS

## 2017-02-22 MED ORDER — CEFAZOLIN SODIUM-DEXTROSE 2-3 GM-%(50ML) IV SOLR
INTRAVENOUS | Status: DC | PRN
  Administered 2017-02-22: 2 g via INTRAVENOUS

## 2017-02-22 MED ORDER — BUPIVACAINE HCL (PF) 0.25 % IJ SOLN
INTRAMUSCULAR | Status: DC | PRN
  Administered 2017-02-22: 5 mL

## 2017-02-22 MED ORDER — ONDANSETRON HCL 4 MG/2ML IJ SOLN
4.0000 mg | Freq: Four times a day (QID) | INTRAMUSCULAR | Status: DC | PRN
  Administered 2017-02-22: 4 mg via INTRAVENOUS
  Filled 2017-02-22: qty 2

## 2017-02-22 MED ORDER — SENNA 8.6 MG PO TABS
1.0000 | ORAL_TABLET | Freq: Two times a day (BID) | ORAL | Status: DC
  Administered 2017-02-22 – 2017-02-23 (×2): 8.6 mg via ORAL
  Filled 2017-02-22 (×2): qty 1

## 2017-02-22 MED ORDER — SODIUM CHLORIDE 0.9 % IR SOLN
Status: DC | PRN
  Administered 2017-02-22: 16:00:00

## 2017-02-22 MED ORDER — LACTATED RINGERS IV SOLN
INTRAVENOUS | Status: DC
  Administered 2017-02-22 (×2): via INTRAVENOUS

## 2017-02-22 MED ORDER — PROPOFOL 10 MG/ML IV BOLUS
INTRAVENOUS | Status: AC
  Filled 2017-02-22: qty 20

## 2017-02-22 MED ORDER — METOCLOPRAMIDE HCL 5 MG/ML IJ SOLN: 10.0000 mg | Freq: Once | INTRAMUSCULAR | Status: DC | PRN

## 2017-02-22 MED ORDER — HYDROMORPHONE HCL 1 MG/ML IJ SOLN
0.5000 mg | INTRAMUSCULAR | Status: DC | PRN
  Administered 2017-02-22: 0.5 mg via INTRAVENOUS
  Filled 2017-02-22: qty 0.5

## 2017-02-22 MED ORDER — SODIUM CHLORIDE 0.9 % IV SOLN: 250.0000 mL | INTRAVENOUS | Status: DC

## 2017-02-22 MED ORDER — DEXAMETHASONE SODIUM PHOSPHATE 10 MG/ML IJ SOLN
INTRAMUSCULAR | Status: AC
  Filled 2017-02-22: qty 2

## 2017-02-22 MED ORDER — HYDROCHLOROTHIAZIDE 25 MG PO TABS
25.0000 mg | ORAL_TABLET | Freq: Every day | ORAL | Status: DC
  Administered 2017-02-22 – 2017-02-23 (×2): 25 mg via ORAL
  Filled 2017-02-22 (×2): qty 1

## 2017-02-22 MED ORDER — THROMBIN 5000 UNITS EX SOLR
CUTANEOUS | Status: AC
  Filled 2017-02-22: qty 5000

## 2017-02-22 MED ORDER — POTASSIUM CHLORIDE IN NACL 20-0.9 MEQ/L-% IV SOLN: INTRAVENOUS | Status: DC

## 2017-02-22 MED ORDER — ACETAMINOPHEN 325 MG PO TABS: 650.0000 mg | ORAL_TABLET | ORAL | Status: DC | PRN

## 2017-02-22 MED ORDER — BUPIVACAINE HCL (PF) 0.25 % IJ SOLN
INTRAMUSCULAR | Status: AC
  Filled 2017-02-22: qty 30

## 2017-02-22 MED ORDER — HEMOSTATIC AGENTS (NO CHARGE) OPTIME
TOPICAL | Status: DC | PRN
  Administered 2017-02-22: 1 via TOPICAL

## 2017-02-22 MED ORDER — LISINOPRIL 20 MG PO TABS
20.0000 mg | ORAL_TABLET | Freq: Every day | ORAL | Status: DC
  Administered 2017-02-22 – 2017-02-23 (×2): 20 mg via ORAL
  Filled 2017-02-22 (×2): qty 1

## 2017-02-22 MED ORDER — ONDANSETRON HCL 4 MG/2ML IJ SOLN
INTRAMUSCULAR | Status: AC
  Filled 2017-02-22: qty 4

## 2017-02-22 MED ORDER — CEFAZOLIN SODIUM-DEXTROSE 2-4 GM/100ML-% IV SOLN
2.0000 g | Freq: Three times a day (TID) | INTRAVENOUS | Status: AC
  Administered 2017-02-22 – 2017-02-23 (×2): 2 g via INTRAVENOUS
  Filled 2017-02-22 (×2): qty 100

## 2017-02-22 MED ORDER — CEFAZOLIN SODIUM-DEXTROSE 2-4 GM/100ML-% IV SOLN
INTRAVENOUS | Status: AC
Start: 2017-02-22 — End: 2017-02-23
  Filled 2017-02-22: qty 100

## 2017-02-22 MED ORDER — LIDOCAINE 2% (20 MG/ML) 5 ML SYRINGE
INTRAMUSCULAR | Status: DC | PRN
  Administered 2017-02-22: 40 mg via INTRAVENOUS

## 2017-02-22 MED ORDER — SODIUM CHLORIDE 0.9% FLUSH: 3.0000 mL | INTRAVENOUS | Status: DC | PRN

## 2017-02-22 MED ORDER — HYDROMORPHONE HCL 2 MG PO TABS
2.0000 mg | ORAL_TABLET | ORAL | Status: DC | PRN
  Administered 2017-02-23 (×3): 2 mg via ORAL
  Filled 2017-02-22 (×3): qty 1

## 2017-02-22 MED ORDER — CELECOXIB 200 MG PO CAPS
200.0000 mg | ORAL_CAPSULE | Freq: Two times a day (BID) | ORAL | Status: DC
  Administered 2017-02-22 – 2017-02-23 (×2): 200 mg via ORAL
  Filled 2017-02-22 (×2): qty 1

## 2017-02-22 SURGICAL SUPPLY — 63 items
ATEC PORO TI PS 10D 9W 25X8X10 (Bone Implant) ×4 IMPLANT
BAG DECANTER FOR FLEXI CONT (MISCELLANEOUS) ×2 IMPLANT
BASKET BONE COLLECTION (BASKET) ×2 IMPLANT
BENZOIN TINCTURE PRP APPL 2/3 (GAUZE/BANDAGES/DRESSINGS) ×2 IMPLANT
BLADE CLIPPER SURG (BLADE) ×2 IMPLANT
BUR MATCHSTICK NEURO 3.0 LAGG (BURR) ×2 IMPLANT
CABLE BIPOLOR RESECTION CORD (MISCELLANEOUS) ×2 IMPLANT
CANISTER SUCT 3000ML PPV (MISCELLANEOUS) ×2 IMPLANT
CARTRIDGE OIL MAESTRO DRILL (MISCELLANEOUS) ×1 IMPLANT
CONT SPEC 4OZ CLIKSEAL STRL BL (MISCELLANEOUS) ×2 IMPLANT
COVER BACK TABLE 60X90IN (DRAPES) ×2 IMPLANT
DERMABOND ADVANCED (GAUZE/BANDAGES/DRESSINGS) ×1
DERMABOND ADVANCED .7 DNX12 (GAUZE/BANDAGES/DRESSINGS) ×1 IMPLANT
DIFFUSER DRILL AIR PNEUMATIC (MISCELLANEOUS) ×2 IMPLANT
DRAPE C-ARM 42X72 X-RAY (DRAPES) ×4 IMPLANT
DRAPE LAPAROTOMY 100X72X124 (DRAPES) ×2 IMPLANT
DRAPE POUCH INSTRU U-SHP 10X18 (DRAPES) ×2 IMPLANT
DRAPE SURG 17X23 STRL (DRAPES) ×2 IMPLANT
DRSG OPSITE POSTOP 4X6 (GAUZE/BANDAGES/DRESSINGS) ×2 IMPLANT
DURAPREP 26ML APPLICATOR (WOUND CARE) ×2 IMPLANT
ELECT REM PT RETURN 9FT ADLT (ELECTROSURGICAL) ×2
ELECTRODE REM PT RTRN 9FT ADLT (ELECTROSURGICAL) ×1 IMPLANT
EVACUATOR 1/8 PVC DRAIN (DRAIN) ×2 IMPLANT
GAUZE SPONGE 4X4 16PLY XRAY LF (GAUZE/BANDAGES/DRESSINGS) IMPLANT
GLOVE BIO SURGEON STRL SZ7 (GLOVE) IMPLANT
GLOVE BIO SURGEON STRL SZ8 (GLOVE) ×4 IMPLANT
GLOVE BIOGEL PI IND STRL 7.0 (GLOVE) ×3 IMPLANT
GLOVE BIOGEL PI IND STRL 7.5 (GLOVE) ×3 IMPLANT
GLOVE BIOGEL PI INDICATOR 7.0 (GLOVE) ×3
GLOVE BIOGEL PI INDICATOR 7.5 (GLOVE) ×3
GLOVE SS N UNI LF 6.5 STRL (GLOVE) ×2 IMPLANT
GLOVE SS N UNI LF 7.0 STRL (GLOVE) ×6 IMPLANT
GOWN STRL REUS W/ TWL LRG LVL3 (GOWN DISPOSABLE) ×1 IMPLANT
GOWN STRL REUS W/ TWL XL LVL3 (GOWN DISPOSABLE) ×3 IMPLANT
GOWN STRL REUS W/TWL 2XL LVL3 (GOWN DISPOSABLE) IMPLANT
GOWN STRL REUS W/TWL LRG LVL3 (GOWN DISPOSABLE) ×1
GOWN STRL REUS W/TWL XL LVL3 (GOWN DISPOSABLE) ×3
HEMOSTAT POWDER KIT SURGIFOAM (HEMOSTASIS) ×2 IMPLANT
KIT BASIN OR (CUSTOM PROCEDURE TRAY) ×2 IMPLANT
KIT ROOM TURNOVER OR (KITS) ×2 IMPLANT
MILL MEDIUM DISP (BLADE) ×2 IMPLANT
NEEDLE HYPO 25X1 1.5 SAFETY (NEEDLE) ×2 IMPLANT
NS IRRIG 1000ML POUR BTL (IV SOLUTION) ×2 IMPLANT
OIL CARTRIDGE MAESTRO DRILL (MISCELLANEOUS) ×2
PACK LAMINECTOMY NEURO (CUSTOM PROCEDURE TRAY) ×2 IMPLANT
PAD ARMBOARD 7.5X6 YLW CONV (MISCELLANEOUS) ×6 IMPLANT
PUTTY DBM ALLOSYNC PURE 5CC (Putty) ×2 IMPLANT
ROD PC 5.5X35 TI ARSENAL (Rod) ×4 IMPLANT
SCREW CBX 5.5X35MM (Screw) ×4 IMPLANT
SCREW CORT CBX 5.5X40 (Screw) ×4 IMPLANT
SCREW SET SPINAL ARSENAL 47127 (Screw) ×8 IMPLANT
SPACER PORUS ATEC 10D9W25X8X10 (Bone Implant) ×2 IMPLANT
SPONGE LAP 4X18 X RAY DECT (DISPOSABLE) IMPLANT
SPONGE SURGIFOAM ABS GEL 100 (HEMOSTASIS) ×2 IMPLANT
STRIP CLOSURE SKIN 1/2X4 (GAUZE/BANDAGES/DRESSINGS) ×2 IMPLANT
SUT VIC AB 0 CT1 18XCR BRD8 (SUTURE) ×1 IMPLANT
SUT VIC AB 0 CT1 8-18 (SUTURE) ×1
SUT VIC AB 2-0 CP2 18 (SUTURE) ×2 IMPLANT
SUT VIC AB 3-0 SH 8-18 (SUTURE) ×4 IMPLANT
TOWEL GREEN STERILE (TOWEL DISPOSABLE) ×2 IMPLANT
TOWEL GREEN STERILE FF (TOWEL DISPOSABLE) ×2 IMPLANT
TRAY FOLEY W/METER SILVER 16FR (SET/KITS/TRAYS/PACK) ×2 IMPLANT
WATER STERILE IRR 1000ML POUR (IV SOLUTION) ×2 IMPLANT

## 2017-02-22 NOTE — Op Note (Signed)
02/22/2017  5:06 PM  PATIENT:  Alan [name]  77 y.o. 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 Redo Decompressive lumbar laminectomy L2-3 requiring more work than would be required for a simple exposure of the disk for PLIF in order to adequately decompress the neural elements and address the spinal stenosis 2. Posterior lumbar interbody fusion L2-3 using porous titanium interbody cages packed with morcellized allograft and autograft 3. Posterior fixation L2-3 using Alphatec cortical pedicle screws.  4. Intertransverse arthrodesis L2-3 using morcellized autograft and allograft.  SURGEON:  Sherley Bounds, MD  ASSISTANTSShelba Flake FNP  ANESTHESIA:  General  EBL: 400 ml  Total I/O In: 1000 [I.V.:1000] Out: 650 [Urine:250; Blood:400]  BLOOD ADMINISTERED:none  DRAINS: none   INDICATION FOR PROCEDURE: This patient presented with back and left leg pain in an L3 distribution. Imaging revealed adjacent level recurrent spinal stenosis L2-3. The patient tried a reasonable attempt at conservative medical measures without relief. I recommended decompression and instrumented fusion to address the stenosis as well as the segmental  instability.  Patient understood the risks, benefits, and alternatives and potential outcomes and wished to proceed.  PROCEDURE DETAILS:  The patient was brought to the operating room. After induction of generalized endotracheal anesthesia the patient was rolled into the prone position on chest rolls and all pressure points were padded. The patient's lumbar region was cleaned and then prepped with DuraPrep and draped in the usual sterile fashion. Anesthesia was injected and then a dorsal midline incision was made and carried down to the lumbosacral fascia. The fascia was opened and the paraspinous musculature was taken down in a subperiosteal fashion to expose  L2-3. A self-retaining retractor was placed. Intraoperative fluoroscopy confirmed my level, and I started with placement of the L2 cortical pedicle screws. The pedicle screw entry zones were identified utilizing surface landmarks and  AP and lateral fluoroscopy. I scored the cortex with the high-speed drill and then used the hand drill to drill an upward and outward direction into the pedicle. I then tapped line to line. I then placed a 5.5 x 35 mm cortical pedicle screw into the pedicles of L2 bilaterally. I then turned my attention to the decompression and complete lumbar laminectomies, hemi- facetectomies, and foraminotomies were performed at L2-3. The patient had significant spinal stenosis and this required more work than would be required for a simple exposure of the disc for posterior lumbar interbody fusion which would only require a limited laminotomy. Much more generous decompression and generous foraminotomy was undertaken in order to adequately decompress the neural elements and address the patient's leg pain. The yellow ligament was removed to expose the underlying dura and nerve roots, and generous foraminotomies were performed to adequately decompress the neural elements. Both the exiting and traversing nerve roots were decompressed on both sides until a coronary dilator passed easily along the nerve roots. Once the decompression was complete, I turned my attention to the posterior lower lumbar interbody fusion. The epidural venous vasculature was coagulated and cut sharply. Disc space was incised and the initial discectomy was performed with pituitary rongeurs. The disc space was distracted with sequential distractors to a height of 10 mm. We then used a series of scrapers and shavers to prepare the endplates for fusion. The midline was prepared with Epstein curettes. Once the complete discectomy was finished, we packed an appropriate sized interbody cage with local autograft  and morcellized  allograft, gently retracted the nerve root, and tapped the cage into position at L2-3.  The midline between the cages was packed with morselized autograft and allograft. We then turned our attention to the placement of the lower pedicle screws. The pedicle screw entry zones were identified utilizing surface landmarks and fluoroscopy. I drilled into each pedicle utilizing the hand drill, and tapped each pedicle with the appropriate tap. We palpated with a ball probe to assure no break in the cortex. We then placed 5.5 x 40 mm cortical pedicle screws into the pedicles bilaterally at L3. We then decorticated the transverse processes and laid a mixture of morcellized autograft and allograft out over these to perform intertransverse arthrodesis at L2-3. We then placed lordotic rods into the multiaxial screw heads of the pedicle screws and locked these in position with the locking caps and anti-torque device. We then checked our construct with AP and lateral fluoroscopy. Irrigated with copious amounts of bacitracin-containing saline solution. Inspected the nerve roots once again to assure adequate decompression, lined to the dura with Gelfoam, placed powdered vancomycin into the wound, and closed the muscle and the fascia with 0 Vicryl. Closed the subcutaneous tissues with 2-0 Vicryl and subcuticular tissues with 3-0 Vicryl. The skin was closed with benzoin and Steri-Strips. Dressing was then applied, the patient was awakened from general anesthesia and transported to the recovery room in stable condition. At the end of the procedure all sponge, needle and instrument counts were correct.   PLAN OF CARE: admit to inpatient  PATIENT DISPOSITION:  PACU - hemodynamically stable.   Delay start of Pharmacological VTE agent (>24hrs) due to surgical blood loss or risk of bleeding:  yes

## 2017-02-22 NOTE — Transfer of Care (Signed)
Immediate Anesthesia Transfer of Care Note  Patient: Alan [name]  Procedure(s) Performed: LUMBAR TWO-THREE POSTERIOR LUMBAR INTERBODY FUSION (N/A Spine Lumbar)  Patient Location: PACU  Anesthesia Type:General  Level of Consciousness: awake, alert  and oriented  Airway & Oxygen Therapy: Patient Spontanous Breathing and Patient connected to nasal cannula oxygen  Post-op Assessment: Report given to RN and Post -op Vital signs reviewed and stable  Post vital signs: Reviewed and stable  Last Vitals:  Vitals:   02/22/17 1723 02/22/17 1730  BP: 131/63   Pulse: 64 60  Resp: (!) 21 20  Temp: 36.8 C   SpO2: 97% 93%    Last Pain:  Vitals:   02/22/17 1723  PainSc: 0-No pain      Patients Stated Pain Goal: 3 (69/45/03 8882)  Complications: No apparent anesthesia complications

## 2017-02-22 NOTE — H&P (Signed)
Subjective: Patient is a 77 y.o. male admitted for back and leg pain. Onset of symptoms was several months ago, gradually worsening since that time.  The pain is rated severe, and is located at the across the lower back and radiates to her left leg in an L3 distribution. The pain is described as aching and occurs all day. The symptoms have been progressive. Symptoms are exacerbated by exercise. MRI or CT showed recurrent spinal stenosis L2-3 adjacent to previous L3-4 fusion   Past Medical History:  Diagnosis Date  . A-fib ([place])   . Anticoagulant long-term use, on coumadin for PAF 03/08/2012  . Chronic back pain   . Complete heart block ([place])   . Coronary artery disease    seen at [facility] cardiology   . Diabetes mellitus   . History of kidney stones   . Hx of echocardiogram 06/29/2008   EF 45-50%   . Hypertension    Dr. Veronia Beets, primary  . Neuromuscular disorder ([place])    carpal tunnel bilaterally  . 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 Polio    hx of  . Sleep apnea    cpap, 11, last sleep study Jan2013, sees Dr. Keturah Barre  . Status post placement of cardiac pacemaker, 03/07/12 New Medtronic Gen and new Vent lead original placed 2008 03/08/2012    Past Surgical History:  Procedure Laterality Date  . APPENDECTOMY    . BACK SURGERY    . BILATERAL CARPAL TUNNEL RELEASE    . CARDIAC CATHETERIZATION    . CORONARY ARTERY BYPASS GRAFT  1990  . CORONARY ARTERY BYPASS GRAFT  2004  . ESOPHAGOGASTRODUODENOSCOPY Left 03/18/2013   Procedure: ESOPHAGOGASTRODUODENOSCOPY (EGD);  Surgeon: Cleotis Nipper, MD;  Location[facility] ENDOSCOPY;  Service: Endoscopy;  Laterality: Left;  . EYE SURGERY Bilateral    cataract surgery with lens implants  . FRACTURE SURGERY     Hx: of right leg  . HARDWARE REMOVAL N/A 05/18/2016   Procedure: Removal of broken hardware Lumbar three-four;  Surgeon: [name], MD;  Location: [place];  Service: Neurosurgery;  Laterality: N/A;  . HERNIA REPAIR    . INSERT  / REPLACE / REMOVE PACEMAKER  03/07/2012   replaced  . JOINT REPLACEMENT     Hx: of left knee  . LEFT HEART CATHETERIZATION WITH CORONARY/GRAFT ANGIOGRAM N/A 12/21/2010   Procedure: LEFT HEART CATHETERIZATION WITH Beatrix Fetters;  Surgeon: Leonie Man, MD;  Location: [facility] CATH LAB;  Service: Cardiovascular;  Laterality: N/A;  . LEG SURGERY Right    lengthened his leg  . LUMBAR LAMINECTOMY/DECOMPRESSION MICRODISCECTOMY  01/10/2012   Procedure: LUMBAR LAMINECTOMY/DECOMPRESSION MICRODISCECTOMY 1 LEVEL;  Surgeon: [name], MD;  Location: [place] NEURO ORS;  Service: Neurosurgery;  Laterality: Bilateral;  Lumbar three-four decompression, Posterior lateral fusion lumbar three-four, posterior spinus plate lumbar three-four  . LUMBAR LAMINECTOMY/DECOMPRESSION MICRODISCECTOMY Left 05/18/2016   Procedure: Laminectomy and Foraminotomy - Lumbar two-lumbar three- Lumbar four-lumbar five- left with removal hardware lumbar three-four;  Surgeon: [name], MD;  Location: [place];  Service: Neurosurgery;  Laterality: Left;  [name] Kitchen MAZE Procedure  2004  . NASAL SEPTUM SURGERY N/A 14  . PACEMAKER INSERTION  08, 1/14  . PACEMAKER REVISION N/A 03/07/2012   Procedure: PACEMAKER REVISION;  Surgeon: Sanda Klein, MD;  Location: [place] CATH LAB;  Service: Cardiovascular;  Laterality: N/A;  . POSTERIOR FUSION LUMBAR SPINE  08/28/2012   Dr Ronnald Ramp  . SHOULDER ARTHROSCOPY WITH OPEN ROTATOR CUFF REPAIR Right 03/30/2014  Procedure: RIGHT SHOULDER ARTHROSCOPY  OPEN ROTATOR CUFF REPAIR;  Surgeon: Kerin Salen, MD;  Location: [place];  Service: Orthopedics;  Laterality: Right;  . SHOULDER OPEN ROTATOR CUFF REPAIR Right 03/30/2014   Procedure: ROTATOR CUFF REPAIR SHOULDER OPEN;  Surgeon: Kerin Salen, MD;  Location: [place];  Service: Orthopedics;  Laterality: Right;  . STENTS    . TONSILLECTOMY      Prior to Admission medications   Medication Sig Start Date End Date Taking? Authorizing Provider  amLODipine (NORVASC) 5 MG  tablet TAKE 1 TABLET BY MOUTH ONCE DAILY Patient taking differently: TAKE 5 MG BY MOUTH ONCE DAILY 01/12/17   [name], Mihai, MD  Calcium Carbonate-Vitamin D (CALTRATE 600+D) 600-400 MG-UNIT tablet Take 1 tablet by mouth daily.    [provider]  carvedilol (COREG) 25 MG tablet TAKE TWO TABLETS BY MOUTH TWICE DAILY Patient taking differently: TAKE 50 MG BY MOUTH TWICE DAILY 07/17/16   Lelon Perla, MD  Cyanocobalamin (VITAMIN B-12 PO) Take 5,000 mcg by mouth daily.     [provider]  diclofenac sodium (VOLTAREN) 1 % GEL Apply 1 g topically 2 (two) times daily as needed (for back pain/aches or pain.).  03/20/16   [provider]  glipiZIDE (GLUCOTROL) 10 MG tablet Take 10 mg by mouth 2 (two) times daily.     [provider]  lisinopril-hydrochlorothiazide (PRINZIDE,ZESTORETIC) 20-25 MG tablet TAKE ONE TABLET BY MOUTH ONCE DAILY 01/09/17   [name], Mihai, MD  metFORMIN (GLUCOPHAGE) 500 MG tablet Take 500 mg by mouth 4 (four) times daily.  08/24/14   [provider]  Polyethyl Glycol-Propyl Glycol (SYSTANE OP) Place 1 drop into both eyes daily.    [provider]   No Known Allergies  Social History   Tobacco Use  . Smoking status: Never Smoker  . Smokeless tobacco: Never Used  Substance Use Topics  . Alcohol use: Yes    Comment: rarely    Family History  Problem Relation Age of Onset  . Other Father        MVA  . Cancer Mother      Review of Systems  Positive ROS: neg  All other systems have been reviewed and were otherwise negative with the exception of those mentioned in the HPI and as above.  Objective: Vital signs in last 24 hours: Temp:  [98.4 F (36.9 C)] 98.4 F (36.9 C) (01/17 1219) Pulse Rate:  [66] 66 (01/17 1219) Resp:  [20] 20 (01/17 1219) BP: (166)/(72) 166/72 (01/17 1219) SpO2:  [100 %] 100 % (01/17 1219) Weight:  [102.5 kg (226 lb)] 102.5 kg (226 lb) (01/17 1219)  General Appearance: Alert,  cooperative, no distress, appears stated age Head: Normocephalic, without obvious abnormality, atraumatic Eyes: PERRL, conjunctiva/corneas clear, EOM's intact    Neck: Supple, symmetrical, trachea midline Back: Symmetric, no curvature, ROM normal, no CVA tenderness Lungs:  respirations unlabored Heart: Regular rate and rhythm Abdomen: Soft, non-tender Extremities: Extremities normal, atraumatic, no cyanosis or edema Pulses: 2+ and symmetric all extremities Skin: Skin color, texture, turgor normal, no rashes or lesions  NEUROLOGIC:   Mental status: Alert and oriented x4,  no aphasia, good attention span, fund of knowledge, and memory Motor Exam - grossly normal Sensory Exam - grossly normal Reflexes: 1+ Coordination - grossly normal Gait - grossly normal Balance - grossly normal Cranial Nerves: I: smell Not tested  II: visual acuity  OS: nl    OD: nl  II: visual fields Full to confrontation  II: pupils Equal, round, reactive to light  III,VII: ptosis None  III,IV,VI: extraocular muscles  Full ROM  V: mastication Normal  V: facial light touch sensation  Normal  V,VII: corneal reflex  Present  VII: facial muscle function - upper  Normal  VII: facial muscle function - lower Normal  VIII: hearing Not tested  IX: soft palate elevation  Normal  IX,X: gag reflex Present  XI: trapezius strength  5/5  XI: sternocleidomastoid strength 5/5  XI: neck flexion strength  5/5  XII: tongue strength  Normal    Data Review Lab Results  Component Value Date   WBC 6.4 02/12/2017   HGB 11.4 (L) 02/12/2017   HCT 35.5 (L) 02/12/2017   MCV 93.4 02/12/2017   PLT 163 02/12/2017   Lab Results  Component Value Date   NA 134 (L) 02/12/2017   K 4.6 02/12/2017   CL 102 02/12/2017   CO2 22 02/12/2017   BUN 28 (H) 02/12/2017   CREATININE 1.54 (H) 02/12/2017   GLUCOSE 186 (H) 02/12/2017   Lab Results  Component Value Date   INR 1.07 05/09/2016    Assessment/Plan: Patient admitted for  PLIF L2-3. Patient has failed a reasonable attempt at conservative therapy.  I explained the condition and procedure to the patient and answered any questions.  Patient wishes to proceed with procedure as planned. Understands risks/ benefits and typical outcomes of procedure.   Mykiah Schmuck S 02/22/2017 12:21 PM

## 2017-02-22 NOTE — Anesthesia Procedure Notes (Signed)
Procedure Name: Intubation Date/Time: 02/22/2017 2:48 PM Performed by: Julieta Bellini, CRNA Pre-anesthesia Checklist: Patient identified, Emergency Drugs available, Suction available and Patient being monitored Patient Re-evaluated:Patient Re-evaluated prior to induction Oxygen Delivery Method: Circle system utilized Preoxygenation: Pre-oxygenation with 100% oxygen Induction Type: IV induction Ventilation: Mask ventilation with difficulty, Two handed mask ventilation required and Oral airway inserted - appropriate to patient size Laryngoscope Size: Mac and 4 Grade View: Grade I Tube size: 7.5 mm Number of attempts: 1 Airway Equipment and Method: Stylet Placement Confirmation: ETT inserted through vocal cords under direct vision,  positive ETCO2 and breath sounds checked- equal and bilateral Secured at: 24 cm Tube secured with: Tape Dental Injury: Teeth and Oropharynx as per pre-operative assessment

## 2017-02-22 NOTE — Anesthesia Preprocedure Evaluation (Signed)
Anesthesia Evaluation  Patient identified by MRN, date of birth, ID band Patient awake    Reviewed: Allergy & Precautions, NPO status , Patient's Chart, lab work & pertinent test results, reviewed documented beta blocker date and time   History of Anesthesia Complications Negative for: history of anesthetic complications  Airway Mallampati: II  TM Distance: >3 FB Neck ROM: Full    Dental  (+) Caps, Dental Advisory Given   Pulmonary sleep apnea and Continuous Positive Airway Pressure Ventilation ,    breath sounds clear to auscultation       Cardiovascular hypertension, Pt. on medications and Pt. on home beta blockers (-) angina+ CAD, + Cardiac Stents and + CABG  + dysrhythmias (s/p Maze) Atrial Fibrillation and Ventricular Tachycardia + pacemaker (dual chamber Medtronic for CHB)  Rhythm:Regular Rate:Normal  '17 ECHO: EF 55-60%, valves OK   Neuro/Psych negative neurological ROS  negative psych ROS   GI/Hepatic negative GI ROS, Neg liver ROS,   Endo/Other  diabetes (glu 90), Oral Hypoglycemic AgentsMorbid obesity  Renal/GU Renal InsufficiencyRenal disease (creat 1.54)     Musculoskeletal  (+) Arthritis , Osteoarthritis,    Abdominal (+) + obese,   Peds  Hematology negative hematology ROS (+)   Anesthesia Other Findings   Reproductive/Obstetrics                             Anesthesia Physical Anesthesia Plan  ASA: III  Anesthesia Plan: General   Post-op Pain Management:    Induction: Intravenous  PONV Risk Score and Plan: 3 and Ondansetron and Dexamethasone  Airway Management Planned: Oral ETT  Additional Equipment:   Intra-op Plan:   Post-operative Plan: Extubation in OR  Informed Consent: I have reviewed the patients History and Physical, chart, labs and discussed the procedure including the risks, benefits and alternatives for the proposed anesthesia with the patient or  authorized representative who has indicated his/her understanding and acceptance.   Dental advisory given  Plan Discussed with: CRNA and Surgeon  Anesthesia Plan Comments: (Plan routine monitors, GETA)        Anesthesia Quick Evaluation

## 2017-02-22 NOTE — Anesthesia Postprocedure Evaluation (Signed)
Anesthesia Post Note  Patient: Alan [name]  Procedure(s) Performed: LUMBAR TWO-THREE POSTERIOR LUMBAR INTERBODY FUSION (N/A Spine Lumbar)     Patient location during evaluation: PACU Anesthesia Type: General Level of consciousness: awake and alert, patient cooperative and oriented Pain management: pain level controlled Vital Signs Assessment: post-procedure vital signs reviewed and stable Respiratory status: spontaneous breathing, nonlabored ventilation, respiratory function stable and patient connected to nasal cannula oxygen Cardiovascular status: blood pressure returned to baseline and stable Postop Assessment: no apparent nausea or vomiting Anesthetic complications: no    Last Vitals:  Vitals:   02/22/17 1815 02/22/17 1820  BP: (!) 124/49 (!) 124/49  Pulse: 61 60  Resp: 20 18  Temp: 36.6 C   SpO2: 99% 98%    Last Pain:  Vitals:   02/22/17 1723  PainSc: 0-No pain    LLE Motor Response: Purposeful movement;Responds to commands (02/22/17 1815) LLE Sensation: Full sensation (02/22/17 1815) RLE Motor Response: Other (Comment)("i can't", when asked to wiggle toes, same pre-op) (02/22/17 1815) RLE Sensation: Full sensation (02/22/17 1815)      [name]. [name]

## 2017-02-23 LAB — GLUCOSE, CAPILLARY
Glucose-Capillary: 245 mg/dL — ABNORMAL HIGH (ref 65–99)
Glucose-Capillary: 89 mg/dL (ref 65–99)

## 2017-02-23 MED ORDER — HYDROMORPHONE HCL 2 MG PO TABS: 2.0000 mg | ORAL_TABLET | Freq: Four times a day (QID) | ORAL | 0 refills | Status: DC | PRN

## 2017-02-23 MED FILL — Thrombin For Soln 5000 Unit: CUTANEOUS | Qty: 5000 | Status: AC

## 2017-02-23 NOTE — Evaluation (Signed)
Physical Therapy Evaluation Patient Details Name: Alan [name] MRN: [id number] DOB: December 31, 1940 Today's Date: 02/23/2017   History of Present Illness  Pt is a 77 y.o. male s/p L2-3 PLIF. PMHx: Afib, Chronic back pain, Complete heart block, CAD, DM, Hx of kidney stones, HTN, Bil carpal tunnel, Pacemaker, Polio, Sleep apnea, Back sx, L TKA, R rotator cuff repair.  Clinical Impression  Pt admitted with above diagnosis. Pt currently with functional limitations due to the deficits listed below (see PT Problem List). At the time of PT eval pt was able to perform transfers and ambulation with gross supervision for safety. Pt with antalgic gait due to R ankle fixation and neuromuscular changes associated with polio, however generally safe. He will have wife present at d/c for support. Pt and wife were educated on precautions, car transfer, activity progression, and general safety with mobility. Pt will benefit from skilled PT to increase their independence and safety with mobility to allow discharge to the venue listed below.       Follow Up Recommendations No PT follow up;Supervision for mobility/OOB    Equipment Recommendations  None recommended by PT    Recommendations for Other Services       Precautions / Restrictions Precautions Precautions: Fall;Back Precaution Booklet Issued: Yes (comment) Precaution Comments: Reviewed handout with pt and wife. Pt was cued for precautions during functional mobility.  Required Braces or Orthoses: (no brace needed order) Restrictions Weight Bearing Restrictions: No      Mobility  Bed Mobility Overal bed mobility: Needs Assistance Bed Mobility: Rolling;Sidelying to Sit Rolling: Modified independent (Device/Increase time) Sidelying to sit: Supervision       General bed mobility comments: VC's for proper log roll technique. HOB slightly elevated and pt used rails minimally.   Transfers Overall transfer level: Needs assistance Equipment used:  None Transfers: Sit to/from Stand Sit to Stand: Supervision         General transfer comment: for safety due to mild balance deficits  Ambulation/Gait Ambulation/Gait assistance: Supervision Ambulation Distance (Feet): 200 Feet Assistive device: None Gait Pattern/deviations: Step-through pattern;Decreased stride length;Antalgic;Decreased weight shift to right Gait velocity: Decreased Gait velocity interpretation: Below normal speed for age/gender General Gait Details: Antalgic due to RLE strength and ROM deficits from polio. Pt reports the way he is walking is "normal" for him.   Stairs Stairs: Yes Stairs assistance: Supervision Stair Management: One rail Right;Step to pattern;Forwards Number of Stairs: 10 General stair comments: VC's for sequencing and general safety. Pt completed without difficulty.   Wheelchair Mobility    Modified Rankin (Stroke Patients Only)       Balance Overall balance assessment: Needs assistance Sitting-balance support: Feet supported;No upper extremity supported Sitting balance-Leahy Scale: Good     Standing balance support: No upper extremity supported;During functional activity Standing balance-Leahy Scale: Fair                               Pertinent Vitals/Pain Pain Assessment: Faces Faces Pain Scale: Hurts a little bit Pain Location: back Pain Descriptors / Indicators: Sore Pain Intervention(s): Monitored during session    Home Living Family/patient expects to be discharged to:: Private residence Living Arrangements: Spouse/significant other Available Help at Discharge: Family;Available 24 hours/day Type of Home: House Home Access: Stairs to enter Entrance Stairs-Rails: Left Entrance Stairs-Number of Steps: 2-3 Home Layout: One level Home Equipment: Shower seat - built in      Prior Function Level of Independence: Independent  Hand Dominance   Dominant Hand: Right    Extremity/Trunk  Assessment   Upper Extremity Assessment Upper Extremity Assessment: Defer to OT evaluation    Lower Extremity Assessment Lower Extremity Assessment: RLE deficits/detail RLE Deficits / Details: Fused ankle and 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    Cervical / Trunk Assessment Cervical / Trunk Assessment: Other exceptions Cervical / Trunk Exceptions: s/p spinal surgery  Communication   Communication: No difficulties  Cognition Arousal/Alertness: Awake/alert Behavior During Therapy: WFL for tasks assessed/performed Overall Cognitive Status: Within Functional Limits for tasks assessed                                        General Comments      Exercises     Assessment/Plan    PT Assessment Patient needs continued PT services  PT Problem List Decreased strength;Decreased activity tolerance;Decreased range of motion;Decreased balance;Decreased mobility;Decreased knowledge of use of DME;Decreased safety awareness;Decreased knowledge of precautions;Pain       PT Treatment Interventions DME instruction;Gait training;Functional mobility training;Stair training;Therapeutic activities;Therapeutic exercise;Neuromuscular re-education;Patient/family education    PT Goals (Current goals can be found in the Care Plan section)  Acute Rehab PT Goals Patient Stated Goal: return home PT Goal Formulation: With patient Time For Goal Achievement: 03/02/17 Potential to Achieve Goals: Good    Frequency Min 5X/week   Barriers to discharge        Co-evaluation               AM-PAC PT "6 Clicks" Daily Activity  Outcome Measure Difficulty turning over in bed (including adjusting bedclothes, sheets and blankets)?: None Difficulty moving from lying on back to sitting on the side of the bed? : A Little Difficulty sitting down on and standing up from a chair with arms (e.g., wheelchair, bedside commode, etc,.)?: A Little Help needed moving to and from a bed to  chair (including a wheelchair)?: A Little Help needed walking in hospital room?: A Little Help needed climbing 3-5 steps with a railing? : A Little 6 Click Score: 19    End of Session Equipment Utilized During Treatment: Gait belt Activity Tolerance: Patient tolerated treatment well Patient left: with call bell/phone within reach;in bed;with family/visitor present Nurse Communication: Mobility status PT Visit Diagnosis: Unsteadiness on feet (R26.81);Pain;Other symptoms and signs involving the nervous system (R29.898) Pain - part of body: (back)    Time: 1024-1040 PT Time Calculation (min) (ACUTE ONLY): 16 min   Charges:   PT Evaluation $PT Eval Moderate Complexity: 1 Mod     PT G Codes:        Rolinda Roan, PT, DPT Acute Rehabilitation Services Pager: [phone]   Thelma Comp 02/23/2017, 11:43 AM

## 2017-02-23 NOTE — Discharge Summary (Signed)
Physician Discharge Summary  Patient ID: Alan [name] MRN: [id number] DOB/AGE: Mar 12, 1940 77 y.o.  Admit date: 02/22/2017 Discharge date: 02/23/2017  Admission Diagnoses: adjacent level stenosis    Discharge Diagnoses: some   Discharged Condition: good  Hospital Course: The patient was admitted on 02/22/2017 and taken to the operating room where the patient underwent PLIF L2-3. The patient tolerated the procedure well and was taken to the recovery room and then to the good in stable condition. The hospital course was routine. There were no complications. The wound remained clean dry and intact. Pt had appropriate back soreness. No complaints of leg pain or new N/T/W. The patient remained afebrile with stable vital signs, and tolerated a regular diet. The patient continued to increase activities, and pain was well controlled with oral pain medications.   Consults: None  Significant Diagnostic Studies:  Results for orders placed or performed during the hospital encounter of 02/22/17  Glucose, capillary  Result Value Ref Range   Glucose-Capillary 90 65 - 99 mg/dL   Comment 1 Notify RN    Comment 2 Document in Chart   Glucose, capillary  Result Value Ref Range   Glucose-Capillary 101 (H) 65 - 99 mg/dL  Glucose, capillary  Result Value Ref Range   Glucose-Capillary 221 (H) 65 - 99 mg/dL   Comment 1 Notify RN    Comment 2 Document in Chart   Glucose, capillary  Result Value Ref Range   Glucose-Capillary 245 (H) 65 - 99 mg/dL   Comment 1 Notify RN    Comment 2 Document in Chart     Dg Lumbar Spine 2-3 Views  Result Date: 02/22/2017 CLINICAL DATA:  PLIF EXAM: DG C-ARM 61-120 MIN; LUMBAR SPINE - 2-3 VIEW COMPARISON:  CT 01/18/2017 FINDINGS: Total fluoroscopy time is 53.3 seconds. 2 low resolution intraoperative spot views of the lumbar spine. Prior operative changes at L3-L4 with portion of a pedicle screw at L4. Placement fixating pedicle screws at L2-L3. Interbody device also  noted. IMPRESSION: Intraoperative fluoroscopic assistance provided during lumbar spine surgery Electronically Signed   By: Donavan Foil M.D.   On: 02/22/2017 18:55   Dg C-arm 1-60 Min  Result Date: 02/22/2017 CLINICAL DATA:  PLIF EXAM: DG C-ARM 61-120 MIN; LUMBAR SPINE - 2-3 VIEW COMPARISON:  CT 01/18/2017 FINDINGS: Total fluoroscopy time is 53.3 seconds. 2 low resolution intraoperative spot views of the lumbar spine. Prior operative changes at L3-L4 with portion of a pedicle screw at L4. Placement fixating pedicle screws at L2-L3. Interbody device also noted. IMPRESSION: Intraoperative fluoroscopic assistance provided during lumbar spine surgery Electronically Signed   By: Donavan Foil M.D.   On: 02/22/2017 18:55    Antibiotics:  Anti-infectives (From admission, onward)   Start     Dose/Rate Route Frequency Ordered Stop   02/22/17 2230  ceFAZolin (ANCEF) IVPB 2g/100 mL premix     2 g 200 mL/hr over 30 Minutes Intravenous Every 8 hours 02/22/17 1854 02/23/17 0611   02/22/17 1543  bacitracin 50,000 Units in sodium chloride irrigation 0.9 % 500 mL irrigation  Status:  Discontinued       As needed 02/22/17 1543 02/22/17 1718   02/22/17 1228  ceFAZolin (ANCEF) 2-4 GM/100ML-% IVPB    Comments:  Hazlip, Jessica   : cabinet override      02/22/17 1228 02/23/17 0044      Discharge Exam: Blood pressure (!) 133/55, pulse 61, temperature 98.5 F (36.9 C), resp. rate 18, weight 102.5 kg (226 lb), SpO2 100 %.  Neurologic: Grossly normal Dressing dry  Discharge Medications:   Allergies as of 02/23/2017   No Known Allergies     Medication List    TAKE these medications   amLODipine 5 MG tablet Commonly known as:  NORVASC TAKE 1 TABLET BY MOUTH ONCE DAILY What changed:    how much to take  how to take this  when to take this   CALTRATE 600+D 600-400 MG-UNIT tablet Generic drug:  Calcium Carbonate-Vitamin D Take 1 tablet by mouth daily.   carvedilol 25 MG tablet Commonly known as:   COREG TAKE TWO TABLETS BY MOUTH TWICE DAILY What changed:    how much to take  how to take this  when to take this   diclofenac sodium 1 % Gel Commonly known as:  VOLTAREN Apply 1 g topically 2 (two) times daily as needed (for back pain/aches or pain.).   glipiZIDE 10 MG tablet Commonly known as:  GLUCOTROL Take 10 mg by mouth 2 (two) times daily.   HYDROmorphone 2 MG tablet Commonly known as:  DILAUDID Take 1 tablet (2 mg total) by mouth every 6 (six) hours as needed for moderate pain.   lisinopril-hydrochlorothiazide 20-25 MG tablet Commonly known as:  PRINZIDE,ZESTORETIC TAKE ONE TABLET BY MOUTH ONCE DAILY   metFORMIN 500 MG tablet Commonly known as:  GLUCOPHAGE Take 500 mg by mouth 4 (four) times daily.   SYSTANE OP Place 1 drop into both eyes daily.   VITAMIN B-12 PO Take 5,000 mcg by mouth daily.            Durable Medical Equipment  (From admission, onward)        Start     Ordered   02/22/17 1855  DME Walker rolling  Once    Question:  Patient needs a walker to treat with the following condition  Answer:  S/P lumbar fusion   02/22/17 1854   02/22/17 1855  DME 3 n 1  Once     02/22/17 1854      Disposition: home  Final Dx: PLIF L2-3  Discharge Instructions     Remove dressing in 72 hours   Complete by:  As directed    Call MD for:  difficulty breathing, headache or visual disturbances   Complete by:  As directed    Call MD for:  persistant nausea and vomiting   Complete by:  As directed    Call MD for:  redness, tenderness, or signs of infection (pain, swelling, redness, odor or green/yellow discharge around incision site)   Complete by:  As directed    Call MD for:  severe uncontrolled pain   Complete by:  As directed    Call MD for:  temperature >100.4   Complete by:  As directed    Diet - low sodium heart healthy   Complete by:  As directed    Increase activity slowly   Complete by:  As directed          Signed: Chasady Longwell  S 02/23/2017, 10:14 AM

## 2017-02-23 NOTE — Evaluation (Signed)
Occupational Therapy Evaluation and Discharge Patient Details Name: Alan [name] MRN: [id number] DOB: July 24, 1940 Today's Date: 02/23/2017    History of Present Illness Pt is a 77 y.o. male s/p L2-3 PLIF. PMHx: Afib, Chronic back pain, Complete heart block, CAD, DM, Hx of kidney stones, HTN, Bil carpal tunnel, Pacemaker, Polio, Sleep apnea, Back sx, L TKA, R rotator cuff repair.   Clinical Impression   Pt reports he was independent with ADL PTA. Currently pt min guard with ADL and functional mobility with the exception of min assist for LB dressing. All back, safety, and ADL education completed with pt and wife. Pt planning to d/c home with 24/7 supervision from family. No further acute OT needs identified; signing off at this time. Please re-consult if needs change. Thank you for this referral.     Follow Up Recommendations  No OT follow up;Supervision/Assistance - 24 hour    Equipment Recommendations  None recommended by OT    Recommendations for Other Services       Precautions / Restrictions Precautions Precautions: Fall;Back Precaution Booklet Issued: No Precaution Comments: Educated pt on 3/3 back precautions Required Braces or Orthoses: (no brace needed) Restrictions Weight Bearing Restrictions: No      Mobility Bed Mobility               General bed mobility comments: Pt OOB upon arrival. Verbally educated on log roll technique.  Transfers Overall transfer level: Needs assistance Equipment used: None Transfers: Sit to/from Stand Sit to Stand: Min guard         General transfer comment: for safety due to mild balance deficits    Balance Overall balance assessment: Needs assistance Sitting-balance support: Feet supported;No upper extremity supported Sitting balance-Leahy Scale: Good     Standing balance support: No upper extremity supported;During functional activity Standing balance-Leahy Scale: Fair                             ADL  either performed or assessed with clinical judgement   ADL Overall ADL's : Needs assistance/impaired Eating/Feeding: Set up;Sitting   Grooming: Supervision/safety;Standing Grooming Details (indicate cue type and reason): Educated on use of 2 cups for oral care Upper Body Bathing: Set up;Sitting   Lower Body Bathing: Min guard;Sit to/from stand   Upper Body Dressing : Set up;Supervision/safety;Sitting   Lower Body Dressing: Minimal assistance;Sit to/from stand Lower Body Dressing Details (indicate cue type and reason): Pt able to cross L foot over opposite knee, difficulty with R foot. Wife to assist as needed. Educated on compensatory strategies Toilet Transfer: Min guard;Ambulation     Toileting - Clothing Manipulation Details (indicate cue type and reason): Educated on proper technique for peri care witthout twisting and use of wet wipes Tub/ Shower Transfer: Min guard;Walk-in shower;Ambulation Tub/Shower Transfer Details (indicate cue type and reason): Recommend use of shower chair seat initially with wife supervising transfer; pt agreeable Functional mobility during ADLs: Min guard General ADL Comments: Educated pt on maintaining back precautions during functional activities, keeping frequently used items at counter top height, log roll for bed mobility, and frequent mobility thorughout the day upon return home.     Vision         Perception     Praxis      Pertinent Vitals/Pain Pain Assessment: Faces Faces Pain Scale: Hurts little more Pain Location: back Pain Descriptors / Indicators: Sore Pain Intervention(s): Monitored during session;Limited activity within patient's tolerance  Hand Dominance     Extremity/Trunk Assessment Upper Extremity Assessment Upper Extremity Assessment: Overall WFL for tasks assessed   Lower Extremity Assessment Lower Extremity Assessment: Defer to PT evaluation   Cervical / Trunk Assessment Cervical / Trunk Assessment: Other  exceptions Cervical / Trunk Exceptions: s/p spinal sx   Communication Communication Communication: No difficulties   Cognition Arousal/Alertness: Awake/alert Behavior During Therapy: WFL for tasks assessed/performed Overall Cognitive Status: Within Functional Limits for tasks assessed                                     General Comments       Exercises     Shoulder Instructions      Home Living Family/patient expects to be discharged to:: Private residence Living Arrangements: Spouse/significant other Available Help at Discharge: Family;Available 24 hours/day Type of Home: House Home Access: Stairs to enter [employer] of Steps: 2-3 Entrance Stairs-Rails: Left Home Layout: One level     Bathroom Shower/Tub: Occupational psychologist: Standard     Home Equipment: Shower seat - built in          Prior Functioning/Environment Level of Independence: Independent                 OT Problem List:        OT Treatment/Interventions:      OT Goals(Current goals can be found in the care plan section) Acute Rehab OT Goals Patient Stated Goal: return home OT Goal Formulation: All assessment and education complete, DC therapy  OT Frequency:     Barriers to D/C:            Co-evaluation              AM-PAC PT "6 Clicks" Daily Activity     Outcome Measure Help from another person eating meals?: None Help from another person taking care of personal grooming?: A Little Help from another person toileting, which includes using toliet, bedpan, or urinal?: A Little Help from another person bathing (including washing, rinsing, drying)?: A Little Help from another person to put on and taking off regular upper body clothing?: A Little Help from another person to put on and taking off regular lower body clothing?: A Little 6 Click Score: 19   End of Session Nurse Communication: Mobility status;Other (comment)(no equipment or f/u  needs)  Activity Tolerance: Patient tolerated treatment well Patient left: with call bell/phone within reach;with family/visitor present;Other (comment)(sitting EOB)  OT Visit Diagnosis: Unsteadiness on feet (R26.81);Pain Pain - part of body: (back)                Time: 1696-7893 OT Time Calculation (min): 19 min Charges:  OT General Charges $OT Visit: 1 Visit OT Evaluation $OT Eval Moderate Complexity: 1 Mod G-Codes:     Andriy Sherk A. Ulice Brilliant, M.S., OTR/L Pager: [place] 02/23/2017, 9:58 AM

## 2017-02-23 NOTE — Progress Notes (Signed)
Patient alert and oriented, mae's well, voiding adequate amount of urine, swallowing without difficulty, no c/o pain at time of discharge. Patient discharged home with family. Script and discharged instructions given to patient. Patient and family stated understanding of instructions given. Patient has an appointment with Dr. Jones °

## 2017-03-06 ENCOUNTER — Ambulatory Visit (INDEPENDENT_AMBULATORY_CARE_PROVIDER_SITE_OTHER): Payer: Medicare HMO | Admitting: Neurology

## 2017-03-06 ENCOUNTER — Encounter: Payer: Self-pay | Admitting: Neurology

## 2017-03-06 VITALS — BP 143/75 | HR 82 | Ht 70.0 in | Wt 217.0 lb

## 2017-03-06 DIAGNOSIS — G4733 Obstructive sleep apnea (adult) (pediatric): Secondary | ICD-10-CM

## 2017-03-06 DIAGNOSIS — Z9989 Dependence on other enabling machines and devices: Secondary | ICD-10-CM

## 2017-03-06 NOTE — Progress Notes (Signed)
Subjective:    Patient ID: Alan [name] is a 77 y.o. male.  HPI     Interim history:   Alan [name] is a 77 year old right-handed gentleman with a complex medical history of hypertension, coronary artery disease, complete heart block, chronic back pain, hyperlipidemia, history of upper GI bleed, A. fib, chronic kidney disease, status post pacemaker placement, postpolio, and obesity, who presents for follow-up consultation of his obstructive sleep apnea, on CPAP therapy. The patient is accompanied by his wife today. I last saw him on 07/03/2016, at which time he was compliant with CPAP therapy but did not like the heated humidity.   Today, 03/06/2017: I reviewed his CPAP compliance data from 02/02/2017 through 03/03/2017, which is a total of 30 days, during which time he used his machine every night with percent used days greater than 4 hours at 87%, indicating very good compliance with an average usage of 6 hours and 23 minutes, residual AHI at goal at 1.2 per hour, leak high with the 95th percentile at 41 L/m on a pressure of 12 cm. He reports doing fairly well, had recent back surgery on 02/22/2017 under Dr. Ronnald Ramp, he is recuperating. He has hardware in place. He tries to walk. He is still having some soreness and has an appointment for follow-up coming up soon. He has changed his CPAP supplies on a regular basis. He still has some leak from the nasal mask, sometimes he uses the nasal pillows. He has tried to reduce the heated humidity and change the posterior nonheated hose, nevertheless, he still feels that the temperature of the air coming in his to high into moist. He will continue to use CPAP. He is pleased with how he is doing, he does not go without using his CPAP.    The patient's allergies, current medications, family history, past medical history, past social history, past surgical history and problem list were reviewed and updated as appropriate.    Previously (copied from previous notes  for reference):   I first met him on 03/02/2016 at the request of his primary care physician, at which time he reported a long-standing history of obstructive sleep apnea and CPAP therapy. He was compliant with his CPAP treatments. He needed new supplies which I provided at the time. Since he was doing well on CPAP therapy and needed new supplies and a new machine I suggested a one-year checkup. Per insurance requirements he needed a face-to-face visit this year.   I reviewed his CPAP compliance data from 05/29/2016 through 06/27/2016 which is a total of 30 days, during which time he used his CPAP every night with percent used days greater than 4 hours at 97%, indicating excellent compliance with an average usage of 7 hours and 40 minutes, residual AHI 0.8 per hour, leak on the high side for the 95th percentile at 59.2 L/m.  03/02/2016: He was previously diagnosed with obstructive sleep apnea and placed on CPAP therapy. Prior sleep study results are not available for my review today, a CPAP download was reviewed today from 02/01/2016 through 03/01/2016, which is a total of 30 days, during which time he used his machine every night with percent used days greater than 4 hours at 100%, indicating superb compliance, average usage of 7 hours and 40 minutes, residual AHI 0.7 per hour, leak on the high side with the 95th percentile at 70.2 L/m on a pressure of 12 cm with EPR of 3. He has an S9 Elite CPAP machine which is at  least 76 years old. He is also status post UPPP for OSA. I reviewed your office note 02/09/2016. He reports compliance with his CPAP and uses nasal pillows. He needs new supplies. He saw my colleague, Dr. Krista Blue twice last year for confusion, dizziness, concern for TIA. I reviewed her note from 11/16/2015. The patient is retired. 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 He lives with his wife. He has one daughter and 2 grandchildren. He is a nonsmoker, drinks alcohol  very occasionally and caffeine in the form of coffee, typically 2 cups in the morning. Bedtime varies and generally between 9 PM and 10:30 PM. Wakeup time is between 5 and 7 AM. He has nocturia usually once per night and denies morning headaches. He denies any frank restless leg symptoms. He has been trying to lose weight. He has lost about 22 pounds since February 2016 when his weight was listed to be 236 pounds, current weight 214 pounds. He used to follow with Dr. Baird Lyons for his sleep apnea. He would like to be able to get a new or updated CPAP machine if possible.  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 He had ankle fusion on the right and surgery to the right leg. He still has decrease in range of motion in his right leg and foot. His Epworth sleepiness score is 13 out of 24 today, his fatigue score is 19 out of 63. He occasionally takes a nap.  His Past Medical History Is Significant For: Past Medical History:  Diagnosis Date  . A-fib ([place])   . Anticoagulant long-term use, on coumadin for PAF 03/08/2012  . Chronic back pain   . Complete heart block ([place])   . Coronary artery disease    seen at [facility] cardiology   . Diabetes mellitus   . History of kidney stones   . Hx of echocardiogram 06/29/2008   EF 45-50%   . Hypertension    Dr. Veronia Beets, primary  . Neuromuscular disorder ([place])    carpal tunnel bilaterally  . 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 Polio    hx of  . Sleep apnea    cpap, 11, last sleep study Jan2013, sees Dr. Keturah Barre  . Status post placement of cardiac pacemaker, 03/07/12 New Medtronic Gen and new Vent lead original placed 2008 03/08/2012    His Past Surgical History Is Significant For: Past Surgical History:  Procedure Laterality Date  . APPENDECTOMY    . BACK SURGERY    . BILATERAL CARPAL TUNNEL RELEASE    . CARDIAC CATHETERIZATION    . CORONARY ARTERY  BYPASS GRAFT  1990  . CORONARY ARTERY BYPASS GRAFT  2004  . ESOPHAGOGASTRODUODENOSCOPY Left 03/18/2013   Procedure: ESOPHAGOGASTRODUODENOSCOPY (EGD);  Surgeon: Cleotis Nipper, MD;  [facility] ENDOSCOPY;  Service: Endoscopy;  Laterality: Left;  . EYE SURGERY Bilateral    cataract surgery with lens implants  . FRACTURE SURGERY     Hx: of right leg  . HARDWARE REMOVAL N/A 05/18/2016   Procedure: Removal of broken hardware Lumbar three-four;  Surgeon: [name], MD;  Location: [place];  Service: Neurosurgery;  Laterality: N/A;  . HERNIA REPAIR    . INSERT / REPLACE / REMOVE PACEMAKER  03/07/2012   replaced  . JOINT REPLACEMENT     Hx: of left knee  .  LEFT HEART CATHETERIZATION WITH CORONARY/GRAFT ANGIOGRAM N/A 12/21/2010   Procedure: LEFT HEART CATHETERIZATION WITH Beatrix Fetters;  Surgeon: Leonie Man, MD;  Location: [facility] CATH LAB;  Service: Cardiovascular;  Laterality: N/A;  . LEG SURGERY Right    lengthened his leg  . LUMBAR LAMINECTOMY/DECOMPRESSION MICRODISCECTOMY  01/10/2012   Procedure: LUMBAR LAMINECTOMY/DECOMPRESSION MICRODISCECTOMY 1 LEVEL;  Surgeon: [name], MD;  Location: [place] NEURO ORS;  Service: Neurosurgery;  Laterality: Bilateral;  Lumbar three-four decompression, Posterior lateral fusion lumbar three-four, posterior spinus plate lumbar three-four  . LUMBAR LAMINECTOMY/DECOMPRESSION MICRODISCECTOMY Left 05/18/2016   Procedure: Laminectomy and Foraminotomy - Lumbar two-lumbar three- Lumbar four-lumbar five- left with removal hardware lumbar three-four;  Surgeon: [name], MD;  Location: [place];  Service: Neurosurgery;  Laterality: Left;  [name] Kitchen MAZE Procedure  2004  . NASAL SEPTUM SURGERY N/A 14  . PACEMAKER INSERTION  08, 1/14  . PACEMAKER REVISION N/A 03/07/2012   Procedure: PACEMAKER REVISION;  Surgeon: Sanda Klein, MD;  Location: [place] CATH LAB;  Service: Cardiovascular;  Laterality: N/A;  . POSTERIOR FUSION LUMBAR SPINE  08/28/2012   Dr Ronnald Ramp  . SHOULDER  ARTHROSCOPY WITH OPEN ROTATOR CUFF REPAIR Right 03/30/2014   Procedure: RIGHT SHOULDER ARTHROSCOPY  OPEN ROTATOR CUFF REPAIR;  Surgeon: Kerin Salen, MD;  Location: [place];  Service: Orthopedics;  Laterality: Right;  . SHOULDER OPEN ROTATOR CUFF REPAIR Right 03/30/2014   Procedure: ROTATOR CUFF REPAIR SHOULDER OPEN;  Surgeon: Kerin Salen, MD;  Location: [place];  Service: Orthopedics;  Laterality: Right;  . STENTS    . TONSILLECTOMY      His Family History Is Significant For: Family History  Problem Relation Age of Onset  . Other Father        MVA  . Cancer Mother     His Social History Is Significant For: Social History   Socioeconomic History  . Marital status: Single    Spouse name: None  . Number of children: 1  . Years of education: 15  . Highest education level: None  Social Needs  . Financial resource strain: None  . Food insecurity - worry: None  . Food insecurity - inability: None  . Transportation needs - medical: None  . Transportation needs - non-medical: None  Occupational History  . Occupation: pilot-retired  Tobacco Use  . Smoking status: Never Smoker  . Smokeless tobacco: Never Used  Substance and Sexual Activity  . Alcohol use: Yes    Comment: rarely  . Drug use: No  . Sexual activity: Yes  Other Topics Concern  . None  Social History Narrative   [place], [place].    Right-handed.   1 cup caffeine per day.    His Allergies Are:  No Known Allergies:   His Current Medications Are:  Outpatient Encounter Medications as of 03/06/2017  Medication Sig  . amLODipine (NORVASC) 5 MG tablet TAKE 1 TABLET BY MOUTH ONCE DAILY (Patient taking differently: TAKE 5 MG BY MOUTH ONCE DAILY)  . Calcium Carbonate-Vitamin D (CALTRATE 600+D) 600-400 MG-UNIT tablet Take 1 tablet by mouth daily.  . carvedilol (COREG) 25 MG tablet TAKE TWO TABLETS BY MOUTH TWICE DAILY (Patient taking differently: TAKE 50 MG BY MOUTH TWICE DAILY)  .  Cyanocobalamin (VITAMIN B-12 PO) Take 5,000 mcg by mouth daily.   . diclofenac sodium (VOLTAREN) 1 % GEL Apply 1 g topically 2 (two) times daily as needed (for back pain/aches or pain.).   [name] Kitchen glipiZIDE (  GLUCOTROL) 10 MG tablet Take 10 mg by mouth 2 (two) times daily.   [name] Kitchen lisinopril-hydrochlorothiazide (PRINZIDE,ZESTORETIC) 20-25 MG tablet TAKE ONE TABLET BY MOUTH ONCE DAILY  . metFORMIN (GLUCOPHAGE) 500 MG tablet Take 500 mg by mouth 4 (four) times daily.   Vladimir Faster Glycol-Propyl Glycol (SYSTANE OP) Place 1 drop into both eyes daily.  . [DISCONTINUED] HYDROmorphone (DILAUDID) 2 MG tablet Take 1 tablet (2 mg total) by mouth every 6 (six) hours as needed for moderate pain.   No facility-administered encounter medications on file as of 03/06/2017.   :  Review of Systems:  Out of a complete 14 point review of systems, all are reviewed and negative with the exception of these symptoms as listed below: Review of Systems  Neurological:       Pt presents today to discuss his cpap. Pt has been working with [facility] to keep his air cool coming from his cpap but has been unsuccessful. Pt does not like the heated tubing.    Objective:  Neurological Exam  Physical Exam Physical Examination:   Vitals:   03/06/17 0934  BP: (!) 143/75  Pulse: 82   General Examination: The patient is a very pleasant 77 y.o. male in no acute distress. He appears well-developed and well-nourished and well groomed. Good spirits.  HEENT:Normocephalic, atraumatic, pupils are equal, round and reactive to light and accommodation. Extraocular tracking is good without limitation to gaze excursion or nystagmus noted. Normal smooth pursuit is noted. Hearing is grossly intac. Face is symmetric with normal facial animation and normal facial sensation. Speech is clear with no dysarthria noted. There is no hypophonia. There is no lip, neck/head, jaw or voice tremor. Neck is supple with full range of passive and active motion. Oropharynx  exam reveals: mildmouth dryness, s/p UPPP, adequate dental hygiene. Mallampati is class II. Tongue protrudes centrally and palate elevates symmetrically. Tonsils are absent.   Chest:Clear to auscultation without wheezing, rhonchi or crackles noted.  Heart:S1+S2+0, regular and normal without murmurs, rubs or gallops noted.   Abdomen:Soft, non-tender and non-distended with normal bowel sounds appreciated on auscultation.  Extremities:There is no obvious change.   Skin: Warm and dry without trophic changes noted.  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   Neurologically:  Mental status: The patient is awake, alert and oriented in all 4 spheres. Hisimmediate and remote memory, attention, language skills and fund of knowledge are appropriate. There is no evidence of aphasia, agnosia, apraxia or anomia. Speech is clear with normal prosody and enunciation. Thought process is linear. Mood is normaland affect is normal.  Cranial nerves II - XII are as described above under HEENT exam. 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 No major change in exam. There is no tremor. Reflexes are 1+ throughout, absent reflexes in the right leg. Fine motor skills and coordination: intact in the UEs, and LEs, except R ankle.  Cerebellar testing: No dysmetria or intention tremor. There is no truncal or gait ataxia.  Sensory exam: intact to light touch.  Gait, station and balance: Hestands slowly and pushes himself up. Posture is age-appropriate, he walks slowly and somewhat cautiously. No limp.   Assessment and Plan:  In summary, Alan [name] a  very pleasant 77 year old male with a complex medical history of  hypertension, coronary artery disease, complete heart block, chronic back pain, status post multiple surgeries including most recently on 02/22/2017, hyperlipidemia, history of upper GI bleed, A. fib, chronic kidney disease, status post pacemaker placement, postpolio, and obesity, who presents for follow-up consultation of his obstructive sleep apnea, well established on CPAP therapy at 12 cm. He continues to be compliant with treatment. His AHI is at goal but leak has been consistently high, a little bit better than last year at this time. He is commended for his PAP adherence. He will is not able to tolerate a full facemask. He continues to use a nasal mask and sometimes nasal pillows. At this juncture, he is advised to follow-up in one year, he can see one of our nurse practitioners. I answered all their questions today and the patient and his wife were in agreement. I spent 20 minutes in total face-to-face time with the patient, more than 50% of which was spent in counseling and coordination of care, reviewing test results, reviewing medication and discussing or reviewing the diagnosis of OSA, its prognosis and treatment options. Pertinent laboratory and imaging test results that were available during this visit with the patient were reviewed by me and considered in my medical decision making (see chart for details).

## 2017-03-06 NOTE — Patient Instructions (Addendum)
Please continue using your CPAP regularly. While your insurance requires that you use CPAP at least 4 hours each night on 70% of the nights, I recommend, that you not skip any nights and use it throughout the night if you can. Getting used to CPAP and staying with the treatment long term does take time and patience and discipline. Untreated obstructive sleep apnea when it is moderate to severe can have an adverse impact on cardiovascular health and raise her risk for heart disease, arrhythmias, hypertension, congestive heart failure, stroke and diabetes. Untreated obstructive sleep apnea causes sleep disruption, nonrestorative sleep, and sleep deprivation. This can have an impact on your day to day functioning and cause daytime sleepiness and impairment of cognitive function, memory loss, mood disturbance, and problems focussing. Using CPAP regularly can improve these symptoms.  Keep up the good work! We can see you in 1 year, you can see one of our nurse practitioners as you are stable. 

## 2017-03-23 ENCOUNTER — Ambulatory Visit (INDEPENDENT_AMBULATORY_CARE_PROVIDER_SITE_OTHER): Payer: Medicare HMO | Admitting: *Deleted

## 2017-03-23 DIAGNOSIS — I495 Sick sinus syndrome: Secondary | ICD-10-CM | POA: Diagnosis not present

## 2017-03-26 NOTE — Progress Notes (Signed)
Remote pacemaker transmission.   

## 2017-03-27 LAB — CUP PACEART INCLINIC DEVICE CHECK
Date Time Interrogation Session: 20190219133218
Implantable Lead Model: 5076
Implantable Lead Model: 5076
Implantable Pulse Generator Implant Date: 20140130
MDC IDC LEAD IMPLANT DT: 20080208
MDC IDC LEAD IMPLANT DT: 20140130
MDC IDC LEAD LOCATION: 753859
MDC IDC LEAD LOCATION: 753860

## 2017-03-28 ENCOUNTER — Encounter: Payer: Self-pay | Admitting: Cardiology

## 2017-04-08 LAB — CUP PACEART REMOTE DEVICE CHECK
Brady Statistic AP VP Percent: 1 %
Brady Statistic AP VS Percent: 98 %
Brady Statistic AS VS Percent: 1 %
Date Time Interrogation Session: 20190218172907
Implantable Lead Implant Date: 20080208
Lead Channel Impedance Value: 510 Ohm
Lead Channel Pacing Threshold Amplitude: 0.75 V
Lead Channel Setting Sensing Sensitivity: 2 mV
MDC IDC LEAD IMPLANT DT: 20140130
MDC IDC LEAD LOCATION: 753859
MDC IDC LEAD LOCATION: 753860
MDC IDC MSMT BATTERY IMPEDANCE: 232 Ohm
MDC IDC MSMT BATTERY REMAINING LONGEVITY: 115 mo
MDC IDC MSMT BATTERY VOLTAGE: 2.79 V
MDC IDC MSMT LEADCHNL RA IMPEDANCE VALUE: 461 Ohm
MDC IDC MSMT LEADCHNL RA PACING THRESHOLD AMPLITUDE: 0.75 V
MDC IDC MSMT LEADCHNL RA PACING THRESHOLD PULSEWIDTH: 0.4 ms
MDC IDC MSMT LEADCHNL RV PACING THRESHOLD PULSEWIDTH: 0.4 ms
MDC IDC PG IMPLANT DT: 20140130
MDC IDC SET LEADCHNL RA PACING AMPLITUDE: 2 V
MDC IDC SET LEADCHNL RV PACING AMPLITUDE: 2.5 V
MDC IDC SET LEADCHNL RV PACING PULSEWIDTH: 0.4 ms
MDC IDC STAT BRADY AS VP PERCENT: 0 %

## 2017-05-14 ENCOUNTER — Other Ambulatory Visit (INDEPENDENT_AMBULATORY_CARE_PROVIDER_SITE_OTHER): Payer: Self-pay | Admitting: Orthopedic Surgery

## 2017-06-05 ENCOUNTER — Other Ambulatory Visit: Payer: Self-pay

## 2017-06-05 DIAGNOSIS — M7989 Other specified soft tissue disorders: Secondary | ICD-10-CM

## 2017-06-25 ENCOUNTER — Encounter: Payer: Medicare HMO | Admitting: *Deleted

## 2017-06-25 ENCOUNTER — Telehealth: Payer: Self-pay | Admitting: Cardiology

## 2017-06-25 NOTE — Telephone Encounter (Signed)
LMOVM reminding pt to send remote transmission.   

## 2017-06-27 ENCOUNTER — Ambulatory Visit (INDEPENDENT_AMBULATORY_CARE_PROVIDER_SITE_OTHER): Payer: Medicare HMO | Admitting: Cardiovascular Disease

## 2017-06-27 ENCOUNTER — Encounter: Payer: Self-pay | Admitting: Cardiovascular Disease

## 2017-06-27 VITALS — BP 137/69 | HR 67 | Ht 70.0 in | Wt 229.8 lb

## 2017-06-27 DIAGNOSIS — E669 Obesity, unspecified: Secondary | ICD-10-CM

## 2017-06-27 DIAGNOSIS — I480 Paroxysmal atrial fibrillation: Secondary | ICD-10-CM

## 2017-06-27 DIAGNOSIS — E7800 Pure hypercholesterolemia, unspecified: Secondary | ICD-10-CM

## 2017-06-27 DIAGNOSIS — E1169 Type 2 diabetes mellitus with other specified complication: Secondary | ICD-10-CM | POA: Diagnosis not present

## 2017-06-27 DIAGNOSIS — I472 Ventricular tachycardia: Secondary | ICD-10-CM

## 2017-06-27 DIAGNOSIS — Z950 Presence of cardiac pacemaker: Secondary | ICD-10-CM

## 2017-06-27 DIAGNOSIS — I495 Sick sinus syndrome: Secondary | ICD-10-CM

## 2017-06-27 DIAGNOSIS — I4729 Other ventricular tachycardia: Secondary | ICD-10-CM

## 2017-06-27 DIAGNOSIS — I10 Essential (primary) hypertension: Secondary | ICD-10-CM | POA: Diagnosis not present

## 2017-06-27 DIAGNOSIS — I25708 Atherosclerosis of coronary artery bypass graft(s), unspecified, with other forms of angina pectoris: Secondary | ICD-10-CM

## 2017-06-27 NOTE — Progress Notes (Signed)
Cardiology Office Note    Date:  06/27/2017   ID:  Alan [name], DOB [birth year], MRN [id number]  PCP:  [name], MD  Cardiologist:  Sanda Klein, MD    chief complaint: Fatigue   History of Present Illness:  Alan [name] is a 77 y.o. male With coronary artery disease, previous CABG, sinus node arrest, status post dual-chamber permanent pacemaker ("atrially dependent"), type 2 diabetes mellitus and dyslipidemia, obstructive sleep apnea on CPAP, here for pacemaker check and complaining of fatigue.  The patient specifically denies any chest pain at rest exertion, dyspnea at rest or with exertion, orthopnea, paroxysmal nocturnal dyspnea, syncope, palpitations, focal neurological deficits, intermittent claudication, lower extremity edema, unexplained weight gain, cough, hemoptysis or wheezing.  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  He has occasional swelling in his left ankle, goes down the next morning.  He is on amlodipine.  He has good glycemic control tells me that his A1c was 6.2% earlier this month.  His lipid profile is acceptable, although his LDL is higher than like at 95.  He has had problems with statins.  We have discussed PCSK9 inhibitors but he would rather not have an injectable medication.  His pacemaker is functioning normally on a comprehensive check today.  The device was implanted in 2014 (Medtronic Adapta dual-chamber) and still has roughly 9years of battery.  He is "atrially dependent" without any detectable atrial activity and with 98% atrial pacing.  He has normal AV conduction and has only about 1.1% ventricular pacing.  Heart rate histograms are favorable.  As before he has occasional episodes of nonsustained ventricular tachycardia, the longest being only 7 beats duration.  Over the last many years he has had brief episodes of nonsustained ventricular  tachycardia that occur about once every 2 months.  Previous workup for ischemia in response to the VT did not lead to any abnormalities.  His device has not recorded any atrial fibrillation.  He has a longstanding history of cardiac problems. He had CABG in 1990 (sequential LIMA to LAD and diagonal), multiple RCA PCI 1990-2004 and a redo CABG in 2004 (SVG-RCA-PDA, with surgical MAZE procedure, Dr. Amador Cunas). Cath 2007 and 2012 show occluded LAD and RCA with patent grafts, 60% ostial OM and diffuse stenoses distal LCX. 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 He had a generator changeout and ventricular lead revision in January 2014. He does not tolerate VVI pacing. He has had occasional nonsustained VT recorded by the device, always asymptomatic. He has had paroxysmal atrial fibrillation. No history of stroke or TIA. Warfarin was stopped for recurrent GI bleeding requiring transfusions. Despite stopping warfarin anticoagulation, he had another episode of GI bleeding requiring transfusion in April 2015. He was seen at [facility] and underwent repeat endoscopy (endoscopic workup in February was negative). He tells me they found "2 small holes in his stomach" which were cauterized. He has not had any bleeding events since. He remains off anti-coagulation.  He wears CPAP for OSA, has insulin requiring type II DM, mild dyslipidemia intolerant to all statins, post-polio syndrome, lumbar spine surgery 01/2012.  He does not trust nuclear stress tests - reports they were repeatedly wrong in the past. [name] told him he should always have a cath rather than a stress test.      Past Medical History:  Diagnosis Date  . A-fib ([place])   . Anticoagulant long-term use, on coumadin for PAF 03/08/2012  . Chronic back pain   . Complete heart block  ([place])   . Coronary artery disease    seen at [facility] cardiology   . Diabetes mellitus   . History of kidney stones   . Hx of echocardiogram 06/29/2008   EF 45-50%   . Hypertension    Dr. Veronia Beets, primary  . Neuromuscular disorder ([place])    carpal tunnel bilaterally  . 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 Polio    hx of  . Sleep apnea    cpap, 11, last sleep study Jan2013, sees Dr. Keturah Barre  . Status post placement of cardiac pacemaker, 03/07/12 New Medtronic Gen and new Vent lead original placed 2008 03/08/2012    Past Surgical History:  Procedure Laterality Date  . APPENDECTOMY    . BACK SURGERY    . BILATERAL CARPAL TUNNEL RELEASE    . CARDIAC CATHETERIZATION    . CORONARY ARTERY BYPASS GRAFT  1990  . CORONARY ARTERY BYPASS GRAFT  2004  . ESOPHAGOGASTRODUODENOSCOPY Left 03/18/2013   Procedure: ESOPHAGOGASTRODUODENOSCOPY (EGD);  Surgeon: Cleotis Nipper, MD;  Location: [facility] ENDOSCOPY;  Service: Endoscopy;  Laterality: Left;  . EYE SURGERY Bilateral    cataract surgery with lens implants  . FRACTURE SURGERY     Hx: of right leg  . HARDWARE REMOVAL N/A 05/18/2016   Procedure: Removal of broken hardware Lumbar three-four;  Surgeon: [name], MD;  Location: [place];  Service: Neurosurgery;  Laterality: N/A;  . HERNIA REPAIR    . INSERT / REPLACE / REMOVE PACEMAKER  03/07/2012   replaced  . JOINT REPLACEMENT     Hx: of left knee  . LEFT HEART CATHETERIZATION WITH CORONARY/GRAFT ANGIOGRAM N/A 12/21/2010   Procedure: LEFT HEART CATHETERIZATION WITH Beatrix Fetters;  Surgeon: Leonie Man, MD;  Location: [facility] CATH LAB;  Service: Cardiovascular;  Laterality: N/A;  . LEG SURGERY Right    lengthened his leg  . LUMBAR LAMINECTOMY/DECOMPRESSION MICRODISCECTOMY  01/10/2012   Procedure: LUMBAR LAMINECTOMY/DECOMPRESSION MICRODISCECTOMY 1 LEVEL;  Surgeon: [name], MD;  Location: [place] NEURO ORS;  Service: Neurosurgery;  Laterality: Bilateral;  Lumbar three-four  decompression, Posterior lateral fusion lumbar three-four, posterior spinus plate lumbar three-four  . LUMBAR LAMINECTOMY/DECOMPRESSION MICRODISCECTOMY Left 05/18/2016   Procedure: Laminectomy and Foraminotomy - Lumbar two-lumbar three- Lumbar four-lumbar five- left with removal hardware lumbar three-four;  Surgeon: [name], MD;  Location: [place];  Service: Neurosurgery;  Laterality: Left;  [name] Kitchen MAZE Procedure  2004  . NASAL SEPTUM SURGERY N/A 14  . PACEMAKER INSERTION  08, 1/14  . PACEMAKER REVISION N/A 03/07/2012   Procedure: PACEMAKER REVISION;  Surgeon: Sanda Klein, MD;  Location: [place] CATH LAB;  Service: Cardiovascular;  Laterality: N/A;  . POSTERIOR FUSION LUMBAR SPINE  08/28/2012   Dr Ronnald Ramp  . SHOULDER ARTHROSCOPY WITH OPEN ROTATOR CUFF REPAIR Right 03/30/2014   Procedure: RIGHT SHOULDER ARTHROSCOPY  OPEN ROTATOR CUFF REPAIR;  Surgeon: Kerin Salen, MD;  Location: [place];  Service: Orthopedics;  Laterality: Right;  . SHOULDER OPEN ROTATOR CUFF REPAIR Right 03/30/2014   Procedure: ROTATOR CUFF REPAIR SHOULDER OPEN;  Surgeon: Kerin Salen, MD;  Location: [place];  Service: Orthopedics;  Laterality: Right;  . STENTS    .  TONSILLECTOMY      Current Medications: Outpatient Medications Prior to Visit  Medication Sig Dispense Refill  . Calcium Carbonate-Vitamin D (CALTRATE 600+D) 600-400 MG-UNIT tablet Take 1 tablet by mouth daily.    . carvedilol (COREG) 25 MG tablet TAKE TWO TABLETS BY MOUTH TWICE DAILY (Patient taking differently: TAKE 50 MG BY MOUTH TWICE DAILY) 360 tablet 3  . Cyanocobalamin (VITAMIN B-12 PO) Take 5,000 mcg by mouth daily.     . diclofenac sodium (VOLTAREN) 1 % GEL Apply 1 g topically 2 (two) times daily as needed (for back pain/aches or pain.).     [name] Kitchen glipiZIDE (GLUCOTROL) 10 MG tablet Take 10 mg by mouth 2 (two) times daily.     [name] Kitchen lisinopril-hydrochlorothiazide (PRINZIDE,ZESTORETIC) 20-25 MG tablet TAKE ONE TABLET BY MOUTH ONCE DAILY 90 tablet 1  . metFORMIN  (GLUCOPHAGE) 500 MG tablet Take 500 mg by mouth 4 (four) times daily.     Vladimir Faster Glycol-Propyl Glycol (SYSTANE OP) Place 1 drop into both eyes daily.    [name] Kitchen amLODipine (NORVASC) 5 MG tablet TAKE 1 TABLET BY MOUTH ONCE DAILY (Patient taking differently: TAKE 5 MG BY MOUTH ONCE DAILY) 90 tablet 3  . amLODipine (NORVASC) 10 MG tablet      No facility-administered medications prior to visit.      Allergies:   Patient has no known allergies.   Social History   Socioeconomic History  . Marital status: Single    Spouse name: Not on file  . Number of children: 1  . Years of education: 56  . Highest education level: Not on file  Occupational History  . Occupation: pilot-retired  Social Needs  . Financial resource strain: Not on file  . Food insecurity:    Worry: Not on file    Inability: Not on file  . Transportation needs:    Medical: Not on file    Non-medical: Not on file  Tobacco Use  . Smoking status: Never Smoker  . Smokeless tobacco: Never Used  Substance and Sexual Activity  . Alcohol use: Yes    Comment: rarely  . Drug use: No  . Sexual activity: Yes  Lifestyle  . Physical activity:    Days per week: Not on file    Minutes per session: Not on file  . Stress: Not on file  Relationships  . Social connections:    Talks on phone: Not on file    Gets together: Not on file    Attends religious service: Not on file    Active member of club or organization: Not on file    Attends meetings of clubs or organizations: Not on file    Relationship status: Not on file  Other Topics Concern  . Not on file  Social History Narrative   [place] [facility], fishes.    Right-handed.   1 cup caffeine per day.     Family History:  The patient's family history includes Cancer in his mother; Other in his father.   ROS:   Please see the history of present illness.    ROS All other systems reviewed and are negative.   PHYSICAL EXAM:   VS:  BP 137/69   Pulse  67   Ht 5\' 10"  (1.778 m)   Wt 229 lb 12.8 oz (104.2 kg)   BMI 32.97 kg/m     General: Alert, oriented x3, no distress, obese Head: no evidence of trauma, PERRL, EOMI, no exophtalmos or lid lag,  no myxedema, no xanthelasma; normal ears, nose and oropharynx Neck: normal jugular venous pulsations and no hepatojugular reflux; brisk carotid pulses without delay and no carotid bruits Chest: clear to auscultation, no signs of consolidation by percussion or palpation, normal fremitus, symmetrical and full respiratory excursions, healthy subclavian pacemaker site Cardiovascular: normal position and quality of the apical impulse, regular rhythm, normal first and widely split second heart sounds, no murmurs, rubs or gallops Abdomen: no tenderness or distention, no masses by palpation, no abnormal pulsatility or arterial bruits, normal bowel sounds, no hepatosplenomegaly Extremities: no clubbing, cyanosis or edema; 2+ radial, ulnar and brachial pulses bilaterally; 2+ right femoral, posterior tibial and dorsalis pedis pulses; 2+ left femoral, posterior tibial and dorsalis pedis pulses; no subclavian or femoral bruits Neurological: grossly nonfocal Psych: Normal mood and affect   Wt Readings from Last 3 Encounters:  06/27/17 229 lb 12.8 oz (104.2 kg)  03/06/17 217 lb (98.4 kg)  02/22/17 226 lb (102.5 kg)      Studies/Labs Reviewed:   EKG:  EKG is ordered today.  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 Melford Aase, December 2018 Total cholesterol 162, triglycerides 1 7, HDL, LDL 95 Creatinine 1.45, hemoglobin 11.3  ASSESSMENT:    No diagnosis found.   PLAN:  In order of problems listed above:  1. CAD s/p redo CABG with exertional angina: Asymptomatic on 2 antianginals rinses beta-blocker, calcium channel blocker 2. AFib:  Previously reported, but no recurrence in many years on his pacemaker.  He has never had an embolic event. CHADSVasc score 4 (Age, vascular disease, diabetes mellitus). Anticoagulation has been withheld due to serious GI bleeding. Continue to monitor for recurrence of arrhythmia via his pacemaker. 3. Sinus node arrest/CHB: Reportedly his device was implanted for complete heart block but he really has a sinus node arrest and he is "atrially dependent".  Appropriate heart rate histogram distribution.  I do not think there is an option to improve his fatigue by changing the rate response settings. 4. NSVT: Episodes are infrequent, a few times a year, consistently asymptomatic.  Continue to monitor.  On beta-blocker. 5. PPM: Continue with remote downloads every 3 months and office visit in 6 months.  Normal device function. 6. HTN: Blood pressure control is good.  Target systolic blood pressure less than 130, which is most often his 7. DM: He remains mildly obese, but glycemic control is good with a recent hemoglobin A1c of 6.2%. 8. HLP: He has tried every single statin and he did not tolerate them.  Prefers not to use PCSK9 inhibitors due to cost.  9. Obesity: Weight loss recommended again,  as a way to improve glycemic control and scale back his medications.    Medication Adjustments/Labs and Tests Ordered: Current medicines are reviewed at length with the patient today.  Concerns regarding medicines are outlined above.  Medication changes, Labs and Tests ordered today are listed in the Patient Instructions below. There are no Patient Instructions on file for this visit.   Signed, Sanda Klein, MD  06/27/2017 9:58 AM    [facility] [address], [place], [place]  [address] Phone: [phone]; Fax: [phone]

## 2017-06-27 NOTE — Patient Instructions (Signed)
Dr Sallyanne Kuster recommends that you continue on your current medications as directed. Please refer to the Current Medication list given to you today.  Remote monitoring is used to monitor your Pacemaker or ICD from home. This monitoring reduces the number of office visits required to check your device to one time per year. It allows [place] to keep an eye on the functioning of your device to ensure it is working properly. You are scheduled for a device check from home on Wednesday, August 21st, 2019. You may send your transmission at any time that day. If you have a wireless device, the transmission will be sent automatically. After your physician reviews your transmission, you will receive a notification with your next transmission date.  To improve our patient care and to more adequately follow your device, [facility] has decided, as a practice, to start following each patient four times a year with your home monitor. This means that you may experience a remote appointment that is close to an in-office appointment with your physician. Your insurance will apply at the same rate as other remote monitoring transmissions.  Dr Sallyanne Kuster recommends that you schedule a follow-up appointment in 12 months with a pacemaker check. You will receive a reminder letter in the mail two months in advance. If you don't receive a letter, please call our office to schedule the follow-up appointment.  If you need a refill on your cardiac medications before your next appointment, please call your pharmacy.

## 2017-06-28 ENCOUNTER — Encounter: Payer: Self-pay | Admitting: Cardiology

## 2017-06-29 DIAGNOSIS — I25708 Atherosclerosis of coronary artery bypass graft(s), unspecified, with other forms of angina pectoris: Secondary | ICD-10-CM | POA: Insufficient documentation

## 2017-07-05 ENCOUNTER — Other Ambulatory Visit: Payer: Self-pay

## 2017-07-05 ENCOUNTER — Ambulatory Visit: Payer: Medicare HMO | Admitting: Vascular Surgery

## 2017-07-05 ENCOUNTER — Ambulatory Visit (HOSPITAL_COMMUNITY)
Admission: RE | Admit: 2017-07-05 | Discharge: 2017-07-05 | Disposition: A | Discharge status: Home / Self Care | Payer: Medicare HMO | Source: Ambulatory Visit | Attending: Vascular Surgery | Admitting: Vascular Surgery

## 2017-07-05 ENCOUNTER — Encounter: Payer: Self-pay | Admitting: Vascular Surgery

## 2017-07-05 VITALS — BP 145/61 | HR 62 | Resp 20 | Ht 70.0 in | Wt 229.1 lb

## 2017-07-05 DIAGNOSIS — M7989 Other specified soft tissue disorders: Secondary | ICD-10-CM | POA: Insufficient documentation

## 2017-07-05 DIAGNOSIS — I872 Venous insufficiency (chronic) (peripheral): Secondary | ICD-10-CM | POA: Insufficient documentation

## 2017-07-05 NOTE — Progress Notes (Signed)
Referring Physician: Vicenta Aly, NP Patient name: Alan [name] MRN: [id number] DOB: [birth year] Sex: male  REASON FOR CONSULT: Right leg swelling  HPI: Alan [name] is a 77 y.o. male for evaluation of right leg swelling.  He states the right leg swelling has been present for about 4 to 5 years but intermittently worse.  He denies prior history of DVT.  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  The operations were primarily about 20 years ago on the right leg.  He had some type of leg lengthening procedure.  He denies any swelling in the left leg.  He has not had any ulcerations on his leg.  He does not have any history of hypercoagulable state.  Other medical problems include atrial fibrillation on Coumadin, coronary artery disease, diabetes, hypertension, sleep apnea all of which are currently stable.  Of note on a separate topic he also did complain of some intermittent balance issues and wonders if he may have had a stroke at some point in the past.  On the medical records that were sent with him I did note that he had a carotid duplex scan performed on December 09, 2015 which showed no significant carotid stenosis.    Past Medical History:  Diagnosis Date  . A-fib ([place])   . Anticoagulant long-term use, on coumadin for PAF 03/08/2012  . Chronic back pain   . Complete heart block ([place])   . Coronary artery disease    seen at [facility] cardiology   . Diabetes mellitus   . History of kidney stones   . Hx of echocardiogram 06/29/2008   EF 45-50%   . Hypertension    Dr. Veronia Beets, primary  . Neuromuscular disorder ([place])    carpal tunnel bilaterally  . 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 Polio    hx of  . Sleep apnea    cpap, 11, last sleep study Jan2013, sees Dr. Keturah Barre  . Status post placement of cardiac pacemaker, 03/07/12 New Medtronic Gen and new Vent lead original placed 2008 03/08/2012   Past  Surgical History:  Procedure Laterality Date  . APPENDECTOMY    . BACK SURGERY    . BILATERAL CARPAL TUNNEL RELEASE    . CARDIAC CATHETERIZATION    . CORONARY ARTERY BYPASS GRAFT  1990  . CORONARY ARTERY BYPASS GRAFT  2004  . ESOPHAGOGASTRODUODENOSCOPY Left 03/18/2013   Procedure: ESOPHAGOGASTRODUODENOSCOPY (EGD);  Surgeon: Cleotis Nipper, MD;  Location: [facility] ENDOSCOPY;  Service: Endoscopy;  Laterality: Left;  . EYE SURGERY Bilateral    cataract surgery with lens implants  . FRACTURE SURGERY     Hx: of right leg  . HARDWARE REMOVAL N/A 05/18/2016   Procedure: Removal of broken hardware Lumbar three-four;  Surgeon: [name], MD;  Location: [place];  Service: Neurosurgery;  Laterality: N/A;  . HERNIA REPAIR    . INSERT / REPLACE / REMOVE PACEMAKER  03/07/2012   replaced  . JOINT REPLACEMENT     Hx: of left knee  . LEFT HEART CATHETERIZATION WITH CORONARY/GRAFT ANGIOGRAM N/A 12/21/2010   Procedure: LEFT HEART CATHETERIZATION WITH Beatrix Fetters;  Surgeon: Leonie Man, MD;  Location: [facility] CATH LAB;  Service: Cardiovascular;  Laterality: N/A;  . LEG SURGERY Right    lengthened his leg  . LUMBAR LAMINECTOMY/DECOMPRESSION MICRODISCECTOMY  01/10/2012   Procedure: LUMBAR LAMINECTOMY/DECOMPRESSION MICRODISCECTOMY 1 LEVEL;  Surgeon: [name],  MD;  Location: [place] NEURO ORS;  Service: Neurosurgery;  Laterality: Bilateral;  Lumbar three-four decompression, Posterior lateral fusion lumbar three-four, posterior spinus plate lumbar three-four  . LUMBAR LAMINECTOMY/DECOMPRESSION MICRODISCECTOMY Left 05/18/2016   Procedure: Laminectomy and Foraminotomy - Lumbar two-lumbar three- Lumbar four-lumbar five- left with removal hardware lumbar three-four;  Surgeon: [name], MD;  Location: [place];  Service: Neurosurgery;  Laterality: Left;  [name] Kitchen MAZE Procedure  2004  . NASAL SEPTUM SURGERY N/A 14  . PACEMAKER INSERTION  08, 1/14  . PACEMAKER REVISION N/A 03/07/2012   Procedure: PACEMAKER  REVISION;  Surgeon: Sanda Klein, MD;  Location: [place] CATH LAB;  Service: Cardiovascular;  Laterality: N/A;  . POSTERIOR FUSION LUMBAR SPINE  08/28/2012   Dr Ronnald Ramp  . SHOULDER ARTHROSCOPY WITH OPEN ROTATOR CUFF REPAIR Right 03/30/2014   Procedure: RIGHT SHOULDER ARTHROSCOPY  OPEN ROTATOR CUFF REPAIR;  Surgeon: Kerin Salen, MD;  Location: [place];  Service: Orthopedics;  Laterality: Right;  . SHOULDER OPEN ROTATOR CUFF REPAIR Right 03/30/2014   Procedure: ROTATOR CUFF REPAIR SHOULDER OPEN;  Surgeon: Kerin Salen, MD;  Location: [place];  Service: Orthopedics;  Laterality: Right;  . STENTS    . TONSILLECTOMY      Family History  Problem Relation Age of Onset  . Other Father        MVA  . Cancer Mother     SOCIAL HISTORY: Social History   Socioeconomic History  . Marital status: Single    Spouse name: Not on file  . Number of children: 1  . Years of education: 19  . Highest education level: Not on file  Occupational History  . Occupation: pilot-retired  Social Needs  . Financial resource strain: Not on file  . Food insecurity:    Worry: Not on file    Inability: Not on file  . Transportation needs:    Medical: Not on file    Non-medical: Not on file  Tobacco Use  . Smoking status: Never Smoker  . Smokeless tobacco: Never Used  Substance and Sexual Activity  . Alcohol use: Yes    Comment: rarely  . Drug use: No  . Sexual activity: Yes  Lifestyle  . Physical activity:    Days per week: Not on file    Minutes per session: Not on file  . Stress: Not on file  Relationships  . Social connections:    Talks on phone: Not on file    Gets together: Not on file    Attends religious service: Not on file    Active member of club or organization: Not on file    Attends meetings of clubs or organizations: Not on file    Relationship status: Not on file  . Intimate partner violence:    Fear of current or ex partner: Not on file    Emotionally abused: Not on file    Physically  abused: Not on file    Forced sexual activity: Not on file  Other Topics Concern  . Not on file  Social History Narrative   [place] [facility], fishes.    Right-handed.   1 cup caffeine per day.    No Known Allergies  Current Outpatient Medications  Medication Sig Dispense Refill  . amLODipine (NORVASC) 10 MG tablet     . Calcium Carbonate-Vitamin D (CALTRATE 600+D) 600-400 MG-UNIT tablet Take 1 tablet by mouth daily.    . carvedilol (COREG) 25 MG tablet TAKE TWO TABLETS  BY MOUTH TWICE DAILY (Patient taking differently: TAKE 50 MG BY MOUTH TWICE DAILY) 360 tablet 3  . Cyanocobalamin (VITAMIN B-12 PO) Take 5,000 mcg by mouth daily.     . diclofenac sodium (VOLTAREN) 1 % GEL Apply 1 g topically 2 (two) times daily as needed (for back pain/aches or pain.).     [name] Kitchen glipiZIDE (GLUCOTROL) 10 MG tablet Take 10 mg by mouth 2 (two) times daily.     [name] Kitchen lisinopril-hydrochlorothiazide (PRINZIDE,ZESTORETIC) 20-25 MG tablet TAKE ONE TABLET BY MOUTH ONCE DAILY 90 tablet 1  . metFORMIN (GLUCOPHAGE) 500 MG tablet Take 500 mg by mouth 4 (four) times daily.     Vladimir Faster Glycol-Propyl Glycol (SYSTANE OP) Place 1 drop into both eyes daily.     No current facility-administered medications for this visit.     ROS:   General:  No weight loss, Fever, chills  HEENT: No recent headaches, no nasal bleeding, no visual changes, no sore throat  Neurologic: No dizziness, blackouts, seizures. No recent symptoms of stroke or mini- stroke. No recent episodes of slurred speech, or temporary blindness.  Cardiac: No recent episodes of chest pain/pressure, no shortness of breath at rest.  No shortness of breath with exertion.  + history of atrial fibrillation or irregular heartbeat  Vascular: No history of rest pain in feet.  No history of claudication.  No history of non-healing ulcer, No history of DVT   Pulmonary: No home oxygen, no productive cough, no hemoptysis,  No asthma or  wheezing  Musculoskeletal:  [X]  Arthritis, [X]  Low back pain,  [X]  Joint pain  Hematologic:No history of hypercoagulable state.  No history of easy bleeding.  No history of anemia  Gastrointestinal: No hematochezia or melena,  No gastroesophageal reflux, no trouble swallowing  Urinary: [ ]  chronic Kidney disease, [ ]  on HD - [ ]  MWF or [ ]  TTHS, [ ]  Burning with urination, [ ]  Frequent urination, [ ]  Difficulty urinating;   Skin: No rashes  Psychological: No history of anxiety,  No history of depression   Physical Examination  Vitals:   07/05/17 0938 07/05/17 0939  BP: (!) 148/67 (!) 145/61  Pulse: 62   Resp: 20   SpO2: 98%   Weight: 229 lb 1.6 oz (103.9 kg)   Height: 5\' 10"  (1.778 m)     Body mass index is 32.87 kg/m.  General:  Alert and oriented, no acute distress HEENT: Normal Neck: No bruit or JVD Pulmonary: Clear to auscultation bilaterally Cardiac: Regular Rate and Rhythm without murmur Abdomen: Soft, non-tender, non-distended, no mass Skin: No rash, no ulcer, hemosiderin staining gaiter area right leg Extremity Pulses:  2+ radial, brachial, femoral, dorsalis pedis, posterior tibial pulses bilaterally Musculoskeletal: No deformity trace pretibial and pedal right leg edema  Neurologic: Upper and lower extremity motor 5/5 and symmetric  DATA:  Patient had a venous reflux exam today which showed a competent saphenofemoral junction with no superficial venous reflux.  He did have some mild reflux in the right common femoral vein with Valsalva.  He also had some deep vein reflux on the left side.  ASSESSMENT: She with chronic right leg swelling.  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  No real intervention for that other than compression therapy.  Stockings were discussed with the patient today.  He was given a brochure regarding measurements for these.  Reassured patient that he was not at risk of limb loss currently that he seems to have adequate  arterial perfusion.  He had no evidence of DVT.  Some of the swelling may also be due to the extensive operations that he had in the past on this leg.  Discussed with patient this will probably be a chronic problem with controllable symptoms with compression but nuisance type symptoms.   PLAN: Patient will should wear bilateral lower extremity compression stockings to control swelling symptoms.  He also discussed his balance issues his carotid duplex was completely normal 2 years ago so I doubt this is related to carotid occlusive disease.  I discussed with him that if he wishes to pursue this further he could certainly have his primary care provider consider neurologic evaluation.   Ruta Hinds, MD [facility] Office: [phone] Pager: [phone]

## 2017-07-06 ENCOUNTER — Encounter: Payer: Medicare HMO | Admitting: Vascular Surgery

## 2017-07-06 ENCOUNTER — Encounter (HOSPITAL_COMMUNITY): Payer: Medicare HMO

## 2017-07-08 ENCOUNTER — Other Ambulatory Visit: Payer: Self-pay | Admitting: Cardiology

## 2017-07-17 ENCOUNTER — Ambulatory Visit: Payer: Medicare HMO | Admitting: Neurology

## 2017-07-17 ENCOUNTER — Encounter

## 2017-08-13 ENCOUNTER — Telehealth (INDEPENDENT_AMBULATORY_CARE_PROVIDER_SITE_OTHER): Payer: Self-pay | Admitting: *Deleted

## 2017-08-13 NOTE — Telephone Encounter (Signed)
Procedure code 64483-Lt L4 TF has been approved  Authorization [id number] Auth Effective Date:07/08/2019Auth End Date:11/11/2017  Procedure code [id number] Lt L4-5 facet has been submitted on [employer] website and is pending, submitted clinical notes.

## 2017-09-03 ENCOUNTER — Ambulatory Visit (INDEPENDENT_AMBULATORY_CARE_PROVIDER_SITE_OTHER): Payer: Medicare HMO | Admitting: Physical Medicine and Rehabilitation

## 2017-09-03 ENCOUNTER — Ambulatory Visit (INDEPENDENT_AMBULATORY_CARE_PROVIDER_SITE_OTHER): Payer: Self-pay

## 2017-09-03 ENCOUNTER — Encounter (INDEPENDENT_AMBULATORY_CARE_PROVIDER_SITE_OTHER): Payer: Self-pay | Admitting: Physical Medicine and Rehabilitation

## 2017-09-03 VITALS — BP 143/63 | HR 66 | Temp 97.7°F

## 2017-09-03 DIAGNOSIS — S139XXA Sprain of joints and ligaments of unspecified parts of neck, initial encounter: Secondary | ICD-10-CM | POA: Diagnosis not present

## 2017-09-03 DIAGNOSIS — M5416 Radiculopathy, lumbar region: Secondary | ICD-10-CM

## 2017-09-03 DIAGNOSIS — M47816 Spondylosis without myelopathy or radiculopathy, lumbar region: Secondary | ICD-10-CM | POA: Diagnosis not present

## 2017-09-03 DIAGNOSIS — M961 Postlaminectomy syndrome, not elsewhere classified: Secondary | ICD-10-CM

## 2017-09-03 MED ORDER — METHYLPREDNISOLONE ACETATE 80 MG/ML IJ SUSP
40.0000 mg | Freq: Once | INTRAMUSCULAR | Status: AC
  Administered 2017-09-03: 40 mg

## 2017-09-03 MED ORDER — BETAMETHASONE SOD PHOS & ACET 6 (3-3) MG/ML IJ SUSP
12.0000 mg | Freq: Once | INTRAMUSCULAR | Status: AC
  Administered 2017-09-03: 12 mg

## 2017-09-03 NOTE — Patient Instructions (Signed)

## 2017-09-03 NOTE — Progress Notes (Signed)
.  Numeric Pain Rating Scale and Functional Assessment Average Pain 5   In the last MONTH (on 0-10 scale) has pain interfered with the following?  1. General activity like being  able to carry out your everyday physical activities such as walking, climbing stairs, carrying groceries, or moving a chair?  Rating(2)   +Driver, -BT, -Dye Allergies.

## 2017-09-04 ENCOUNTER — Encounter (INDEPENDENT_AMBULATORY_CARE_PROVIDER_SITE_OTHER): Payer: Self-pay | Admitting: Physical Medicine and Rehabilitation

## 2017-09-04 NOTE — Procedures (Signed)
Lumbosacral Transforaminal Epidural Steroid Injection - Sub-Pedicular Approach with Fluoroscopic Guidance  Patient: Alan [name]      Date of Birth: 05/22/1940 MRN: [id number] PCP: [name], MD      Visit Date: 09/03/2017   Universal Protocol:    Date/Time: 09/03/2017  Consent Given By: the patient  Position: PRONE  Additional Comments: Vital signs were monitored before and after the procedure. Patient was prepped and draped in the usual sterile fashion. The correct patient, procedure, and site was verified.   Injection Procedure Details:  Procedure Site One Meds Administered:  Meds ordered this encounter  Medications  . betamethasone acetate-betamethasone sodium phosphate (CELESTONE) injection 12 mg  . methylPREDNISolone acetate (DEPO-MEDROL) injection 40 mg    Laterality: Left  Location/Site:  L4-L5  Needle size: 22 G  Needle type: Spinal  Needle Placement: Transforaminal  Findings:    -Comments: Excellent flow of contrast along the nerve and into the epidural space.  Procedure Details: After squaring off the end-plates to get a true AP view, the C-arm was positioned so that an oblique view of the foramen as noted above was visualized. The target area is just inferior to the "nose of the scotty dog" or sub pedicular. The soft tissues overlying this structure were infiltrated with 2-3 ml. of 1% Lidocaine without Epinephrine.  The spinal needle was inserted toward the target using a "trajectory" view along the fluoroscope beam.  Under AP and lateral visualization, the needle was advanced so it did not puncture dura and was located close the 6 O'Clock position of the pedical in AP tracterory. Biplanar projections were used to confirm position. Aspiration was confirmed to be negative for CSF and/or blood. A 1-2 ml. volume of Isovue-250 was injected and flow of contrast was noted at each level. Radiographs were obtained for documentation purposes.   After  attaining the desired flow of contrast documented above, a 0.5 to 1.0 ml test dose of 0.25% Marcaine was injected into each respective transforaminal space.  The patient was observed for 90 seconds post injection.  After no sensory deficits were reported, and normal lower extremity motor function was noted,  the above injectate, Celestone,  was administered so that equal amounts of the injectate were placed at each foramen (level) into the transforaminal epidural space.   Additional Comments:  The patient tolerated the procedure well Dressing: Band-Aid    Post-procedure details: Patient was observed during the procedure. Post-procedure instructions were reviewed.  Patient left the clinic in stable condition.

## 2017-09-04 NOTE — Procedures (Signed)
Lumbar Facet Joint Intra-Articular Injection(s) with Fluoroscopic Guidance  Patient: Alan [name]      Date of Birth: [birth year] MRN: [id number] PCP: [name], MD      Visit Date: 09/03/2017   Universal Protocol:    Date/Time: 09/03/2017  Consent Given By: the patient  Position: PRONE   Additional Comments: Vital signs were monitored before and after the procedure. Patient was prepped and draped in the usual sterile fashion. The correct patient, procedure, and site was verified.   Injection Procedure Details:  Procedure Site One Meds Administered:  Meds ordered this encounter  Medications  . betamethasone acetate-betamethasone sodium phosphate (CELESTONE) injection 12 mg  . methylPREDNISolone acetate (DEPO-MEDROL) injection 40 mg     Laterality: Left  Location/Site:  L4-L5  Needle size: 22 guage  Needle type: Spinal  Needle Placement: Articular  Findings:  -Comments: Excellent flow of contrast producing a partial arthrogram.  Procedure Details: The fluoroscope beam is vertically oriented in AP, and the inferior recess is visualized beneath the lower pole of the inferior apophyseal process, which represents the target point for needle insertion. When direct visualization is difficult the target point is located at the medial projection of the vertebral pedicle. The region overlying each aforementioned target is locally anesthetized with a 1 to 2 ml. volume of 1% Lidocaine without Epinephrine.   The spinal needle was inserted into each of the above mentioned facet joints using biplanar fluoroscopic guidance. A 0.25 to 0.5 ml. volume of Isovue-250 was injected and a partial facet joint arthrogram was obtained. A single spot film was obtained of the resulting arthrogram.    One to 1.25 ml of the steroid/anesthetic solution, Depo-medrol, was then injected into each of the facet joints noted above.   Additional Comments:  The patient tolerated the procedure  well Dressing: Band-Aid    Post-procedure details: Patient was observed during the procedure. Post-procedure instructions were reviewed.  Patient left the clinic in stable condition.
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
## 2017-09-26 ENCOUNTER — Ambulatory Visit: Payer: Medicare HMO | Admitting: *Deleted

## 2017-09-26 ENCOUNTER — Telehealth: Payer: Self-pay | Admitting: Cardiology

## 2017-09-26 NOTE — Telephone Encounter (Signed)
LMOVM reminding pt to send remote transmission.   

## 2017-09-28 ENCOUNTER — Encounter: Payer: Self-pay | Admitting: Cardiology

## 2017-09-28 NOTE — Progress Notes (Signed)
Opened in error

## 2017-10-10 ENCOUNTER — Ambulatory Visit (INDEPENDENT_AMBULATORY_CARE_PROVIDER_SITE_OTHER): Payer: Medicare HMO | Admitting: *Deleted

## 2017-10-10 DIAGNOSIS — I495 Sick sinus syndrome: Secondary | ICD-10-CM

## 2017-10-11 NOTE — Progress Notes (Signed)
Remote pacemaker transmission.   

## 2017-11-01 LAB — CUP PACEART REMOTE DEVICE CHECK
Battery Impedance: 256 Ohm
Battery Voltage: 2.79 V
Brady Statistic AP VP Percent: 2 %
Brady Statistic AS VS Percent: 2 %
Date Time Interrogation Session: 20190904201423
Implantable Lead Implant Date: 20140130
Implantable Lead Location: 753860
Implantable Lead Model: 5076
Implantable Lead Model: 5076
Lead Channel Impedance Value: 448 Ohm
Lead Channel Impedance Value: 488 Ohm
Lead Channel Pacing Threshold Pulse Width: 0.4 ms
Lead Channel Setting Pacing Amplitude: 2.5 V
Lead Channel Setting Pacing Pulse Width: 0.4 ms
MDC IDC LEAD IMPLANT DT: 20080208
MDC IDC LEAD LOCATION: 753859
MDC IDC MSMT BATTERY REMAINING LONGEVITY: 111 mo
MDC IDC MSMT LEADCHNL RA PACING THRESHOLD AMPLITUDE: 0.75 V
MDC IDC MSMT LEADCHNL RV PACING THRESHOLD AMPLITUDE: 0.625 V
MDC IDC MSMT LEADCHNL RV PACING THRESHOLD PULSEWIDTH: 0.4 ms
MDC IDC PG IMPLANT DT: 20140130
MDC IDC SET LEADCHNL RA PACING AMPLITUDE: 2 V
MDC IDC SET LEADCHNL RV SENSING SENSITIVITY: 2 mV
MDC IDC STAT BRADY AP VS PERCENT: 96 %
MDC IDC STAT BRADY AS VP PERCENT: 0 %

## 2017-11-05 ENCOUNTER — Encounter: Payer: Self-pay | Admitting: Adult Health

## 2017-11-05 ENCOUNTER — Ambulatory Visit (INDEPENDENT_AMBULATORY_CARE_PROVIDER_SITE_OTHER): Payer: Medicare HMO | Admitting: Adult Health

## 2017-11-05 ENCOUNTER — Telehealth: Payer: Self-pay | Admitting: Cardiovascular Disease

## 2017-11-05 VITALS — BP 134/68 | HR 63 | Ht 70.0 in | Wt 219.0 lb

## 2017-11-05 DIAGNOSIS — I2510 Atherosclerotic heart disease of native coronary artery without angina pectoris: Secondary | ICD-10-CM | POA: Diagnosis not present

## 2017-11-05 DIAGNOSIS — I480 Paroxysmal atrial fibrillation: Secondary | ICD-10-CM | POA: Diagnosis not present

## 2017-11-05 DIAGNOSIS — I10 Essential (primary) hypertension: Secondary | ICD-10-CM

## 2017-11-05 DIAGNOSIS — Z950 Presence of cardiac pacemaker: Secondary | ICD-10-CM

## 2017-11-05 NOTE — Progress Notes (Signed)
Cardiology Office Note   Date:  11/05/2017   ID:  Alan [name], DOB [birth year], MRN [id number]  PCP:  [name], MD  Cardiologist:  Dr.[name]  Chief Complaint  Patient presents with  . Shortness of Breath    when active  . Palpitations     History of Present Illness: Alan [name] is a 77 y.o. male who presents for evaluation as an "add on" with known history of CAD, previous CABG, s/p dual chamber PPM, (atrially dependent), with other history to include Type II diabetes, OSA, dyslipidemia. He called our office this morning with complaints of palpitations. He states that he had some confusion on his medications and was told to take lisinopril AND losartan by PCP provider. He began to have profound fatigue. He went back to PCP and saw a different provider and was told to only take losartan. Since that time, he has had a lot of palpitations and irregular beats.   Labs were drawn on 10/30/2017 with potassium of 5.1 with creatinine 1.34. He states he has been more tired lately, and had trouble walking up his driveway due to profound weakness.   (copied for accuracy) He has a longstanding history of cardiac problems. He had CABG in 1990 (sequential LIMA to LAD and diagonal), multiple RCA PCI 1990-2004 and a redo CABG in 2004 (SVG-RCA-PDA, with surgical MAZE procedure, Dr. Amador Cunas). Cath 2007 and 2012 show occluded LAD and RCA with patent grafts, 60% ostial OM and diffuse stenoses distal LCX. 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 He had a generator changeout and ventricular lead revision in January 2014. He does not tolerate VVI pacing. He has had occasional nonsustained VT recorded by the device, always asymptomatic. He has had paroxysmal atrial fibrillation. No history of stroke or TIA. Warfarin was stopped for recurrent  GI bleeding requiring transfusions. Despite stopping warfarin anticoagulation, he had another episode of GI bleeding requiring transfusion in April 2015. He was seen at [place] and underwent repeat endoscopy (endoscopic workup in February was negative). He tells me they found "2 small holes in his stomach" which were cauterized. He has not had any bleeding events since. He remains off anti-coagulation. He wears CPAP for OSA, has insulin requiring type II DM, mild dyslipidemia intolerant to all statins, post-polio syndrome, lumbar spine surgery 01/2012.  He does not trust nuclear stress tests - reports they were repeatedly wrong in the past. [name] told him he should always have a cath rather than a stress test.    Past Medical History:  Diagnosis Date  . A-fib ([place])   . Anticoagulant long-term use, on coumadin for PAF 03/08/2012  . Chronic back pain   . Complete heart block ([place])   . Coronary artery disease    seen at [facility] cardiology   . Diabetes mellitus   . History of kidney stones   . Hx of echocardiogram 06/29/2008   EF 45-50%   . Hypertension    Dr. Veronia Beets, primary  . Neuromuscular disorder ([place])    carpal tunnel bilaterally  . 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 Polio    hx of  . Sleep apnea    cpap, 11, last sleep study Jan2013, sees Dr. Keturah Barre  . Status post placement of cardiac pacemaker, 03/07/12 New Medtronic Gen and new  Vent lead original placed 2008 03/08/2012    Past Surgical History:  Procedure Laterality Date  . APPENDECTOMY    . BACK SURGERY    . BILATERAL CARPAL TUNNEL RELEASE    . CARDIAC CATHETERIZATION    . CORONARY ARTERY BYPASS GRAFT  1990  . CORONARY ARTERY BYPASS GRAFT  2004  . ESOPHAGOGASTRODUODENOSCOPY Left 03/18/2013   Procedure: ESOPHAGOGASTRODUODENOSCOPY (EGD);  Surgeon: Cleotis Nipper, MD;  Location: [facility] ENDOSCOPY;  Service: Endoscopy;  Laterality: Left;  . EYE SURGERY Bilateral    cataract surgery with lens implants    . FRACTURE SURGERY     Hx: of right leg  . HARDWARE REMOVAL N/A 05/18/2016   Procedure: Removal of broken hardware Lumbar three-four;  Surgeon: [name], MD;  Location: [place];  Service: Neurosurgery;  Laterality: N/A;  . HERNIA REPAIR    . INSERT / REPLACE / REMOVE PACEMAKER  03/07/2012   replaced  . JOINT REPLACEMENT     Hx: of left knee  . LEFT HEART CATHETERIZATION WITH CORONARY/GRAFT ANGIOGRAM N/A 12/21/2010   Procedure: LEFT HEART CATHETERIZATION WITH Beatrix Fetters;  Surgeon: Leonie Man, MD;  Location: [facility] CATH LAB;  Service: Cardiovascular;  Laterality: N/A;  . LEG SURGERY Right    lengthened his leg  . LUMBAR LAMINECTOMY/DECOMPRESSION MICRODISCECTOMY  01/10/2012   Procedure: LUMBAR LAMINECTOMY/DECOMPRESSION MICRODISCECTOMY 1 LEVEL;  Surgeon: [name], MD;  Location: [place] NEURO ORS;  Service: Neurosurgery;  Laterality: Bilateral;  Lumbar three-four decompression, Posterior lateral fusion lumbar three-four, posterior spinus plate lumbar three-four  . LUMBAR LAMINECTOMY/DECOMPRESSION MICRODISCECTOMY Left 05/18/2016   Procedure: Laminectomy and Foraminotomy - Lumbar two-lumbar three- Lumbar four-lumbar five- left with removal hardware lumbar three-four;  Surgeon: [name], MD;  Location: [place];  Service: Neurosurgery;  Laterality: Left;  [name] Kitchen MAZE Procedure  2004  . NASAL SEPTUM SURGERY N/A 14  . PACEMAKER INSERTION  08, 1/14  . PACEMAKER REVISION N/A 03/07/2012   Procedure: PACEMAKER REVISION;  Surgeon: Sanda Klein, MD;  Location: [place] CATH LAB;  Service: Cardiovascular;  Laterality: N/A;  . POSTERIOR FUSION LUMBAR SPINE  08/28/2012   Dr Ronnald Ramp  . SHOULDER ARTHROSCOPY WITH OPEN ROTATOR CUFF REPAIR Right 03/30/2014   Procedure: RIGHT SHOULDER ARTHROSCOPY  OPEN ROTATOR CUFF REPAIR;  Surgeon: Kerin Salen, MD;  Location: [place];  Service: Orthopedics;  Laterality: Right;  . SHOULDER OPEN ROTATOR CUFF REPAIR Right 03/30/2014   Procedure: ROTATOR CUFF REPAIR SHOULDER  OPEN;  Surgeon: Kerin Salen, MD;  Location: [place];  Service: Orthopedics;  Laterality: Right;  . STENTS    . TONSILLECTOMY       Current Outpatient Medications  Medication Sig Dispense Refill  . amLODipine (NORVASC) 10 MG tablet     . carvedilol (COREG) 25 MG tablet TAKE 2 TABLETS BY MOUTH TWICE DAILY 360 tablet 3  . diclofenac sodium (VOLTAREN) 1 % GEL Apply 1 g topically 2 (two) times daily as needed (for back pain/aches or pain.).     [name] Kitchen glipiZIDE (GLUCOTROL) 10 MG tablet Take 10 mg by mouth 2 (two) times daily.     [name] Kitchen losartan-hydrochlorothiazide (HYZAAR) 100-25 MG tablet Take by mouth.    . metFORMIN (GLUCOPHAGE) 500 MG tablet Take 500 mg by mouth 4 (four) times daily.     . Calcium Carbonate-Vitamin D (CALTRATE 600+D) 600-400 MG-UNIT tablet Take 1 tablet by mouth daily.    . cyanocobalamin ([employer] VITAMIN B12) 1000 MCG tablet Take by mouth.    [name] Kitchen lisinopril-hydrochlorothiazide (PRINZIDE,ZESTORETIC) 20-25  MG tablet TAKE ONE TABLET BY MOUTH ONCE DAILY (Patient not taking: Reported on 11/05/2017) 90 tablet 1   No current facility-administered medications for this visit.     Allergies:   Patient has no known allergies.    Social History:  The patient  reports that he has never smoked. He has never used smokeless tobacco. He reports that he drinks alcohol. He reports that he does not use drugs.   Family History:  The patient's family history includes Cancer in his mother; Other in his father.    ROS: All other systems are reviewed and negative. Unless otherwise mentioned in H&P    PHYSICAL EXAM: VS:  Ht 5\' 10"  (1.778 m)   Wt 219 lb (99.3 kg)   BMI 31.42 kg/m  , BMI Body mass index is 31.42 kg/m. GEN: Well nourished, well developed, in no acute distress HEENT: normal Neck: no JVD, carotid bruits, or masses Cardiac: RRR;  Occasional irregular beats, no murmurs, rubs, or gallops,no edema  Respiratory:  Clear to auscultation bilaterally, normal work of breathing GI: soft,  nontender, nondistended, + BS. Obese  MS: no deformity or atrophy Skin: warm and dry, no rash Neuro:  Strength and sensation are intact Psych: euthymic mood, full affect   EKG:  Atrial pacing with RBBB. Rate of 63 bpm/  Recent Labs: 02/12/2017: BUN 28; Creatinine, Ser 1.54; Hemoglobin 11.4; Platelets 163; Potassium 4.6; Sodium 134    Lipid Panel    Component Value Date/Time   CHOL 134 12/20/2010 0903   TRIG 104 12/20/2010 0903   HDL 38 (L) 12/20/2010 0903   CHOLHDL 3.5 12/20/2010 0903   VLDL 21 12/20/2010 0903   LDLCALC 75 12/20/2010 0903      Wt Readings from Last 3 Encounters:  11/05/17 219 lb (99.3 kg)  07/05/17 229 lb 1.6 oz (103.9 kg)  06/27/17 229 lb 12.8 oz (104.2 kg)      Other studies Reviewed: Echocardiogram 2015/11/06  Left ventricle: The cavity size was normal. Wall thickness was   normal. Systolic function was normal. The estimated ejection   fraction was in the range of 55% to 60%. Wall motion was normal;   there were no regional wall motion abnormalities. Features are   consistent with a pseudonormal left ventricular filling pattern,   with concomitant abnormal relaxation and increased filling   pressure (grade 2 diastolic dysfunction). - Mitral valve: Calcified annulus. There was trivial regurgitation. - Left atrium: The atrium was moderately dilated. Volume, ES,   (1-plane Simpson&'s, A2C): 45.2 ml. - Right ventricle: The cavity size was mildly dilated. Wall   thickness was normal. Pacer wire or catheter noted in right   ventricle. - Right atrium: The atrium was mildly dilated. Pacer wire or   catheter noted in right atrium.  ASSESSMENT AND PLAN:  1. Frequent Palpitations: I have had his PPM interrogated on site. This was reviewed by Dr. Sallyanne Kuster. This was noted to have 5 high VT episodes and periods of NSVT,with >30,000 PVC's. Dr. Sallyanne Kuster graciously reviewed the [facility] interrogation report. He did not find anything that was out of the ordinary in the  report for this patient. He has had similar histogram in the past. No medication changes were recommended, no new testing.    I have advised the patient to stay hydrated and to keep out of temperatures greater than 85 degrees. He had been outside catering a BBQ and had been working in the heat for several hours when he had the weakness described while  walking up a driveway.   2. CAD: Hx of CABG.PCI to RCA, Redo CABG in 2004. Most recent cath in 2012. No new planned testing at this time. Continue current regimen.   3. Hypertension:  He is now only taking losartan/HCTZ.  BP is Controlled today. I have reviewed his most recent labs from [place]. Creatinine is improved from 1.4 to 1.3. Potassium is high normal at 5.1.   4. PAF: Not a candidate for anticoagulation in the setting of GIB,   5.GIB: He is to see PCP for ongoing treatment.   Current medicines are reviewed at length with the patient today.  See Dr. Sallyanne Kuster in one month if symptoms do not improve with hydration and staying of the heat .  Labs/ tests ordered today include: None   Phill Myron. West Pugh, ANP, AACC   11/05/2017 10:45 AM    [address] [address] Office [phone] Fax [phone]

## 2017-11-05 NOTE — Telephone Encounter (Signed)
New Message:   Patient stated he is not sure of what is going on with him but believe he is having Palpitations   Patient c/o Palpitations:  High priority if patient c/o lightheadedness, shortness of breath, or chest pain  1) How long have you had palpitations/irregular HR/ Afib? Are you having the symptoms now? NO   2) Are you currently experiencing lightheadedness, SOB or CP? No   3) Do you have a history of afib (atrial fibrillation) or irregular heart rhythm? No   4) Have you checked your BP or HR? (document readings if available): NO  Are you experiencing any other symptoms? No

## 2017-11-05 NOTE — Patient Instructions (Signed)
Medication Instructions:  NO CHANGES- Your physician recommends that you continue on your current medications as directed. Please refer to the Current Medication list given to you today.  If you need a refill on your cardiac medications before your next appointment, please call your pharmacy.  Special Instructions: MAKE SURE TO STAY HYDRATED  Follow-Up: Your physician wants you to follow-up in: 1 [place].   Thank you for choosing [facility] at [facility]!!

## 2017-11-05 NOTE — Telephone Encounter (Signed)
Spoke with pt significant other Ann. Pt has been having increased palpitation over the couple of days. An appt was previously scheduled for 10/1 with [name], PA. Pt would like a sooner appt. appt scheduled with Jory Sims, D-NP today at 10:30am. Lelon Frohlich voiced appreciation for the assistance.

## 2017-11-06 ENCOUNTER — Ambulatory Visit: Payer: Medicare HMO | Admitting: Physician Assistant

## 2017-11-28 ENCOUNTER — Telehealth: Payer: Self-pay | Admitting: Cardiovascular Disease

## 2017-11-28 NOTE — Telephone Encounter (Signed)
New message   Patient is experiencing AFIB and wants to know if he can be worked in any sooner with Dr. Sallyanne Kuster. His Appt is scheduled for 12/05/2017. Please advise.

## 2017-11-28 NOTE — Telephone Encounter (Signed)
Called patient back about his message. Patient complaining of feeling like he is in A. FIB, and wants to be seen earlier then 12/05/17. Patient stated he saw Jory Sims NP on 11/05/17, but she did not check his pacemaker. Will send message to device clinic to see if they can possibly see patient or do a remote check.

## 2017-11-28 NOTE — Telephone Encounter (Signed)
Spoke with pt informed him that Dr. Loletha Grayer did not have any earlier opening before 10/30, asked pt to send in a transmission pt stated that he had difficulty sending in a transmission and that he had to take his home monitor in to town in order to send. Informed pt that I would have [employer] contact him to discuss issues with home monitor. Pt voiced understanding

## 2017-11-29 ENCOUNTER — Ambulatory Visit (INDEPENDENT_AMBULATORY_CARE_PROVIDER_SITE_OTHER): Payer: Medicare HMO

## 2017-11-29 DIAGNOSIS — Z23 Encounter for immunization: Secondary | ICD-10-CM

## 2017-12-03 ENCOUNTER — Other Ambulatory Visit: Payer: Self-pay | Admitting: Neurological Surgery

## 2017-12-03 DIAGNOSIS — M48061 Spinal stenosis, lumbar region without neurogenic claudication: Secondary | ICD-10-CM

## 2017-12-05 ENCOUNTER — Ambulatory Visit: Payer: Medicare HMO | Admitting: Cardiovascular Disease

## 2017-12-05 ENCOUNTER — Encounter: Payer: Self-pay | Admitting: Cardiovascular Disease

## 2017-12-05 VITALS — BP 138/69 | HR 64 | Ht 70.0 in | Wt 225.8 lb

## 2017-12-05 DIAGNOSIS — Z950 Presence of cardiac pacemaker: Secondary | ICD-10-CM

## 2017-12-05 DIAGNOSIS — I495 Sick sinus syndrome: Secondary | ICD-10-CM | POA: Diagnosis not present

## 2017-12-05 DIAGNOSIS — I10 Essential (primary) hypertension: Secondary | ICD-10-CM

## 2017-12-05 DIAGNOSIS — E7800 Pure hypercholesterolemia, unspecified: Secondary | ICD-10-CM

## 2017-12-05 DIAGNOSIS — I4729 Other ventricular tachycardia: Secondary | ICD-10-CM

## 2017-12-05 DIAGNOSIS — I480 Paroxysmal atrial fibrillation: Secondary | ICD-10-CM

## 2017-12-05 DIAGNOSIS — I25708 Atherosclerosis of coronary artery bypass graft(s), unspecified, with other forms of angina pectoris: Secondary | ICD-10-CM | POA: Diagnosis not present

## 2017-12-05 DIAGNOSIS — E1169 Type 2 diabetes mellitus with other specified complication: Secondary | ICD-10-CM

## 2017-12-05 DIAGNOSIS — I472 Ventricular tachycardia: Secondary | ICD-10-CM | POA: Diagnosis not present

## 2017-12-05 DIAGNOSIS — E669 Obesity, unspecified: Secondary | ICD-10-CM

## 2017-12-05 LAB — BASIC METABOLIC PANEL
BUN / CREAT RATIO: 22 (ref 10–24)
BUN: 31 mg/dL — ABNORMAL HIGH (ref 8–27)
CO2: 21 mmol/L (ref 20–29)
CREATININE: 1.42 mg/dL — AB (ref 0.76–1.27)
Calcium: 9.7 mg/dL (ref 8.6–10.2)
Chloride: 100 mmol/L (ref 96–106)
GFR, EST AFRICAN AMERICAN: 55 mL/min/{1.73_m2} — AB (ref >59)
GFR, EST NON AFRICAN AMERICAN: 48 mL/min/{1.73_m2} — AB (ref >59)
GLUCOSE: 182 mg/dL — AB (ref 65–99)
Potassium: 5 mmol/L (ref 3.5–5.2)
SODIUM: 135 mmol/L (ref 134–144)

## 2017-12-05 LAB — CBC
Hematocrit: 35.7 % — ABNORMAL LOW (ref 37.5–51.0)
Hemoglobin: 11.2 g/dL — ABNORMAL LOW (ref 13.0–17.7)
MCH: 29.5 pg (ref 26.6–33.0)
MCHC: 31.4 g/dL — AB (ref 31.5–35.7)
MCV: 94 fL (ref 79–97)
Platelets: 191 10*3/uL (ref 150–450)
RBC: 3.8 x10E6/uL — ABNORMAL LOW (ref 4.14–5.80)
RDW: 12.6 % (ref 12.3–15.4)
WBC: 5.7 10*3/uL (ref 3.4–10.8)

## 2017-12-05 LAB — PROTIME-INR
INR: 1.1 (ref 0.8–1.2)
Prothrombin Time: 11.3 s (ref 9.1–12.0)

## 2017-12-05 NOTE — Progress Notes (Signed)
Cardiology Office Note    Date:  12/05/2017   ID:  Alan [name], DOB 1941-01-30, MRN [id number]  PCP:  [name], MD  Cardiologist:  Sanda Klein, MD   Chief Complaint  Patient presents with  . Follow-up    pt c/o fullness in chest    History of Present Illness:  Alan [name] is a 77 y.o. male with coronary artery disease, previous CABG, sinus node arrest, status post dual-chamber permanent pacemaker ("atrially dependent"), type 2 diabetes mellitus and dyslipidemia, obstructive sleep apnea on CPAP, here for pacemaker check.  His biggest problem has been severe lower back pain.  He is scheduled for a lumbar myelogram next week.  It is possible that he will need a complicated lumbar spine procedure that involves both anterior and posterior spine exposure.  This has caused him a lot of distress.  He has had an increased frequency of palpitations which he experiences as sudden strong thumps in his chest.  These are not associated with dizziness, shortness of breath or chest pain.  He has not experienced syncope.  They do appear to occur more during periods of emotional upset.  I could not find a correlation between the episodes of nonsustained VT recorded by his device and the reported palpitations.  He is already on a high dose of beta-blocker.  He has also had some chest tightness with activity, although this has been inconsistent.  He has to walk approximately 300 feet to his mailbox and on one occasion he had to stop on his way back because he simply could not go any longer", seemingly a combination of dyspnea and chest tightness.  However, he has subsequently been able to walk the same distance without similar complaints.  The patient specifically denies any chest pain at rest, dyspnea at rest or with exertion, orthopnea, paroxysmal nocturnal dyspnea, syncope, focal neurological deficits, intermittent claudication, lower extremity edema, unexplained weight gain, cough,  hemoptysis or wheezing.  He has good glycemic control.  Most recent LDL 95, not at target. We discussed PCSK9 inhibitors again today. He seems to be more receptive.  His pacemaker is functioning normally on a comprehensive check today.  The device was implanted in 2014 (Medtronic Adapta dual-chamber) and still has roughly 8.5 years of battery.  He is "atrially dependent" without any detectable atrial activity and with 98% atrial pacing.  He has normal AV conduction and has only about 2.2% ventricular pacing.  Heart rate histograms are favorable.  As before he has occasional episodes of nonsustained ventricular tachycardia, the longest being 16 beats duration.  Over the last many years he has had brief episodes of nonsustained ventricular tachycardia that occur about once or twice every 1 or 2 months.  Previous workup for ischemia in response to the VT did not lead to any abnormalities.  His device has not recorded any atrial fibrillation in several years.  He has a longstanding history of cardiac problems. He had CABG in 1990 (sequential LIMA to LAD and diagonal), multiple RCA PCI 1990-2004 and a redo CABG in 2004 (SVG-RCA-PDA, with surgical MAZE procedure, Dr. Amador Cunas). Cath 2007 and 2012 show occluded LAD and RCA with patent grafts, 60% ostial OM and diffuse stenoses distal LCX. 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 He had a generator changeout and ventricular lead revision  in January 2014. He does not tolerate VVI pacing. He has had occasional nonsustained VT recorded by the device, always asymptomatic. He has had paroxysmal atrial fibrillation in the remote past. No history of stroke or TIA. Warfarin was stopped for recurrent GI bleeding requiring transfusions. Despite stopping warfarin anticoagulation, he had another episode of GI  bleeding requiring transfusion in April 2015. He was seen at [facility] and underwent repeat endoscopy (endoscopic workup in February was negative). He tells me they found "2 small holes in his stomach" which were cauterized. He has not had any bleeding events since. He remains off anti-coagulation.  He wears CPAP for OSA, has insulin requiring type II DM, mild dyslipidemia intolerant to all statins, post-polio syndrome, lumbar spine surgery 01/2012.  He does not trust nuclear stress tests - reports they were repeatedly wrong in the past. [name] told him he should always have a cath rather than a stress test.    Past Medical History:  Diagnosis Date  . A-fib ([place])   . Anticoagulant long-term use, on coumadin for PAF 03/08/2012  . Chronic back pain   . Complete heart block ([place])   . Coronary artery disease    seen at [facility] cardiology   . Diabetes mellitus   . History of kidney stones   . Hx of echocardiogram 06/29/2008   EF 45-50%   . Hypertension    Dr. Veronia Beets, primary  . Neuromuscular disorder ([place])    carpal tunnel bilaterally  . 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 Polio    hx of  . Sleep apnea    cpap, 11, last sleep study Jan2013, sees Dr. Keturah Barre  . Status post placement of cardiac pacemaker, 03/07/12 New Medtronic Gen and new Vent lead original placed 2008 03/08/2012    Past Surgical History:  Procedure Laterality Date  . APPENDECTOMY    . BACK SURGERY    . BILATERAL CARPAL TUNNEL RELEASE    . CARDIAC CATHETERIZATION    . CORONARY ARTERY BYPASS GRAFT  1990  . CORONARY ARTERY BYPASS GRAFT  2004  . ESOPHAGOGASTRODUODENOSCOPY Left 03/18/2013   Procedure: ESOPHAGOGASTRODUODENOSCOPY (EGD);  Surgeon: Cleotis Nipper, MD;  Location: [facility] ENDOSCOPY;  Service: Endoscopy;  Laterality: Left;  . EYE SURGERY Bilateral    cataract surgery with lens implants  . FRACTURE SURGERY     Hx: of right leg  . HARDWARE REMOVAL N/A 05/18/2016   Procedure: Removal of  broken hardware Lumbar three-four;  Surgeon: [name], MD;  Location: [place];  Service: Neurosurgery;  Laterality: N/A;  . HERNIA REPAIR    . INSERT / REPLACE / REMOVE PACEMAKER  03/07/2012   replaced  . JOINT REPLACEMENT     Hx: of left knee  . LEFT HEART CATHETERIZATION WITH CORONARY/GRAFT ANGIOGRAM N/A 12/21/2010   Procedure: LEFT HEART CATHETERIZATION WITH Beatrix Fetters;  Surgeon: Leonie Man, MD;  Location[facility] CATH LAB;  Service: Cardiovascular;  Laterality: N/A;  . LEG SURGERY Right    lengthened his leg  . LUMBAR LAMINECTOMY/DECOMPRESSION MICRODISCECTOMY  01/10/2012   Procedure: LUMBAR LAMINECTOMY/DECOMPRESSION MICRODISCECTOMY 1 LEVEL;  Surgeon: [name], MD;  Location: [facility] NEURO ORS;  Service: Neurosurgery;  Laterality: Bilateral;  Lumbar three-four decompression, Posterior lateral fusion lumbar three-four, posterior spinus plate lumbar three-four  . LUMBAR LAMINECTOMY/DECOMPRESSION MICRODISCECTOMY Left 05/18/2016   Procedure: Laminectomy and Foraminotomy - Lumbar two-lumbar three- Lumbar four-lumbar five- left with removal hardware lumbar three-four;  Surgeon: [name], MD;  Location: [facility]  OR;  Service: Neurosurgery;  Laterality: Left;  [name] Kitchen MAZE Procedure  2004  . NASAL SEPTUM SURGERY N/A 14  . PACEMAKER INSERTION  08, 1/14  . PACEMAKER REVISION N/A 03/07/2012   Procedure: PACEMAKER REVISION;  Surgeon: Sanda Klein, MD;  Location: [facility] CATH LAB;  Service: Cardiovascular;  Laterality: N/A;  . POSTERIOR FUSION LUMBAR SPINE  08/28/2012   Dr Ronnald Ramp  . SHOULDER ARTHROSCOPY WITH OPEN ROTATOR CUFF REPAIR Right 03/30/2014   Procedure: RIGHT SHOULDER ARTHROSCOPY  OPEN ROTATOR CUFF REPAIR;  Surgeon: Kerin Salen, MD;  Location: [facility];  Service: Orthopedics;  Laterality: Right;  . SHOULDER OPEN ROTATOR CUFF REPAIR Right 03/30/2014   Procedure: ROTATOR CUFF REPAIR SHOULDER OPEN;  Surgeon: Kerin Salen, MD;  Location: [facility];  Service: Orthopedics;  Laterality: Right;  .  STENTS    . TONSILLECTOMY      Current Medications: Outpatient Medications Prior to Visit  Medication Sig Dispense Refill  . amLODipine (NORVASC) 10 MG tablet     . Calcium Carbonate-Vitamin D (CALTRATE 600+D) 600-400 MG-UNIT tablet Take 1 tablet by mouth daily.    . carvedilol (COREG) 25 MG tablet TAKE 2 TABLETS BY MOUTH TWICE DAILY 360 tablet 3  . cyanocobalamin ([employer] VITAMIN B12) 1000 MCG tablet Take by mouth.    . diclofenac sodium (VOLTAREN) 1 % GEL Apply 1 g topically 2 (two) times daily as needed (for back pain/aches or pain.).     [name] Kitchen glipiZIDE (GLUCOTROL) 10 MG tablet Take 10 mg by mouth 2 (two) times daily.     [name] Kitchen losartan-hydrochlorothiazide (HYZAAR) 100-25 MG tablet Take by mouth.    . metFORMIN (GLUCOPHAGE) 500 MG tablet Take 500 mg by mouth 4 (four) times daily.     [name] Kitchen lisinopril-hydrochlorothiazide (PRINZIDE,ZESTORETIC) 20-25 MG tablet TAKE ONE TABLET BY MOUTH ONCE DAILY 90 tablet 1   No facility-administered medications prior to visit.      Allergies:   Patient has no known allergies.   Social History   Socioeconomic History  . Marital status: Single    Spouse name: Not on file  . Number of children: 1  . Years of education: 58  . Highest education level: Not on file  Occupational History  . Occupation: pilot-retired  Social Needs  . Financial resource strain: Not on file  . Food insecurity:    Worry: Not on file    Inability: Not on file  . Transportation needs:    Medical: Not on file    Non-medical: Not on file  Tobacco Use  . Smoking status: Never Smoker  . Smokeless tobacco: Never Used  Substance and Sexual Activity  . Alcohol use: Yes    Comment: rarely  . Drug use: No  . Sexual activity: Yes  Lifestyle  . Physical activity:    Days per week: Not on file    Minutes per session: Not on file  . Stress: Not on file  Relationships  . Social connections:    Talks on phone: Not on file    Gets together: Not on file    Attends religious service: Not  on file    Active member of club or organization: Not on file    Attends meetings of clubs or organizations: Not on file    Relationship status: Not on file  Other Topics Concern  . Not on file  Social History Narrative   [place] [facility], fishes.    Right-handed.   1 cup caffeine per  day.     Family History:  The patient's family history includes Cancer in his mother; Other in his father.   ROS:   Please see the history of present illness.    ROS All other systems reviewed and are negative.   PHYSICAL EXAM:   VS:  BP 138/69   Pulse 64   Ht 5\' 10"  (1.778 m)   Wt 225 lb 12.8 oz (102.4 kg)   BMI 32.40 kg/m      General: Alert, oriented x3, no distress, mildly obese.  Healthy left subclavian pacemaker site Head: no evidence of trauma, PERRL, EOMI, no exophtalmos or lid lag, no myxedema, no xanthelasma; normal ears, nose and oropharynx Neck: normal jugular venous pulsations and no hepatojugular reflux; brisk carotid pulses without delay and no carotid bruits Chest: clear to auscultation, no signs of consolidation by percussion or palpation, normal fremitus, symmetrical and full respiratory excursions Cardiovascular: normal position and quality of the apical impulse, regular rhythm, normal first and second heart sounds, no murmurs, rubs or gallops Abdomen: no tenderness or distention, no masses by palpation, no abnormal pulsatility or arterial bruits, normal bowel sounds, no hepatosplenomegaly Extremities: no clubbing, cyanosis or edema; 2+ radial, ulnar and brachial pulses bilaterally; 2+ right femoral, posterior tibial and dorsalis pedis pulses; 2+ left femoral, posterior tibial and dorsalis pedis pulses; no subclavian or femoral bruits Neurological: grossly nonfocal Psych: Normal mood and affect   Wt Readings from Last 3 Encounters:  12/05/17 225 lb 12.8 oz (102.4 kg)  11/05/17 219 lb (99.3 kg)  07/05/17 229 lb 1.6 oz (103.9 kg)     Studies/Labs Reviewed:    EKG:  EKG is ordered today.  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  Recent Labs: 12/05/2017: BUN 31; Creatinine, Ser 1.42; Hemoglobin 11.2; Platelets 191; Potassium 5.0; Sodium 135  10/30/2017 Creat 1.34, GFR 51. CO2 19, K 5.1. 10/16/2017 Hgb A1c 6.5% Lipid Panel  Dr. Melford Aase, December 2018 Total cholesterol 162, triglycerides 1 7, HDL, LDL 95 Creatinine 1.45, hemoglobin 11.3  ASSESSMENT:    1. Coronary artery disease of bypass graft of native heart with stable angina pectoris (HCC)   2. Paroxysmal atrial fibrillation ([place])   3. SSS (sick sinus syndrome) ([place])   4. NSVT (nonsustained ventricular tachycardia) (HCC)   5. Status post placement of cardiac pacemaker   6. Essential hypertension   7. Diabetes mellitus type 2 in obese ([place])   8. Hypercholesterolemia   9. Mild obesity      PLAN:  In order of problems listed above:  1. CAD s/p redo CABG with exertional angina: It seems he is describing exertional angina, while receiving  2 antianginals (high-dose carvedilol and a maximum dose of amlodipine).  However the symptoms are not always predictably present.  He has no faith nuclear stress test.  It is probably worth repeating a coronary angiography, but we discussed the fact that percutaneous revascularization could significantly delay his spinal procedures.  I think he should go ahead with his lumbar myelogram first.  However, if he should need a complicated lumbar spine surgical procedure that includes mobilization of the abdominal-pelvic great vessels, he should probably have coronary angiography first.  Depending on the findings on the angiogram, we may have to decide whether or not revascularization should happen first (since this could delay his lumbar spine surgery by as much as 12 months). 2. AFib: No recurrence by  pacemaker check in several  years. CHADSVasc score 4 (Age, vascular disease, diabetes mellitus). Anticoagulation has been withheld due to serious GI bleeding. Continue to monitor for recurrence of arrhythmia via his pacemaker. 3. Sinus node arrest/CHB: Reportedly his device was implanted for complete heart block, he is but he really has a sinus node arrest and he is "atrially dependent".  Appropriate heart rate histogram distribution. 4. NSVT: Episodes are infrequent, a few times a year, consistently asymptomatic.  They do not appear to correlate with his current complaints of palpitations, which sound like they are related to isolated PVCs. 5. PPM: Continue with remote downloads every 3 months and office visit in 6 months.  Normal device function. 6. HTN:  Target systolic blood pressure less than 130, which is most often is the case.  His back is hurting him today which may be driving up his blood pressure.  No change to meds today. 7. DM: Last A1c 6.4%, remains mildly obese. 8. HLP: Intolerant to ever single available statin. Discussed PCSK9 inhibitors today.  He was more receptive today and I think Repatha might be a great drug for him to try.  Recheck lipids.  Try 9. Obesity: Down 10 lb in first 6 months of this year, but he has regained for 5 pounds. If he wants to be on fewer meds for DM and HTN, try to lose even more weight.    Medication Adjustments/Labs and Tests Ordered: Current medicines are reviewed at length with the patient today.  Concerns regarding medicines are outlined above.  Medication changes, Labs and Tests ordered today are listed in the Patient Instructions below. Patient Instructions  Medication Instructions:  Dr Sallyanne Kuster recommends that you continue on your current medications as directed. Please refer to the Current Medication list given to you today.  If you need a refill on your cardiac medications before your next appointment, please call your pharmacy.   Lab work: Your physician recommends  that you return for lab work TODAY. If you have labs (blood work) drawn today and your tests are completely normal, you will receive your results only by: [name] Kitchen MyChart Message (if you have MyChart) OR . A paper copy in the mail If you have any lab test that is abnormal or we need to change your treatment, we will call you to review the results.  Testing/Procedures: Remote monitoring is used to monitor your Pacemaker of ICD from home. This monitoring reduces the number of office visits required to check your device to one time per year. It allows [place] to keep an eye on the functioning of your device to ensure it is working properly. You are scheduled for a device check from home on Wednesday, December 4th, 2019. You may send your transmission at any time that day. If you have a wireless device, the transmission will be sent automatically. After your physician reviews your transmission, you will receive a postcard with your next transmission date.  Your physician has requested that you have a cardiac catheterization. Cardiac catheterization is used to diagnose and/or treat various heart conditions. Doctors may recommend this procedure for a number of different reasons. The most common reason is to evaluate chest pain. Chest pain can be a symptom of coronary artery disease (CAD), and cardiac catheterization can show whether plaque is narrowing or blocking your heart's arteries. This procedure is also used to evaluate the valves, as well as measure the blood flow and oxygen levels in different parts of your heart. For further information please  visit [email or web address]. Please follow instruction sheet, as given.  Follow-Up: At [facility], you and your health needs are our priority.  As part of our continuing mission to provide you with exceptional heart care, we have created designated Provider Care Teams.  These Care Teams include your primary Cardiologist (physician) and Advanced Practice Providers (APPs -   Physician Assistants and Nurse Practitioners) who all work together to provide you with the care you need, when you need it. You will need a follow up appointment in 6 months.  Please call our office 2 months in advance to schedule this appointment.  You may see Sanda Klein, MD or one of the following Advanced Practice Providers on your designated Care Team: [place], [place] . Fabian Sharp, PA-C  Any Other Special Instructions Will Be Listed Below (If Applicable).   [address] [place] [address] [place] [address] Dept: [phone] Loc: [place]  12/05/2017  You are scheduled for a Cardiac Catheterization on Thursday, November 14 with Dr. Glenetta Hew.  1. Please arrive at the [facility] (Main Entrance A) at [facility]: [address] [place], [place] [address] at 7:00 AM (This time is two hours before your procedure to ensure your preparation). Free valet parking service is available.   Special note: Every effort is made to have your procedure done on time. Please understand that emergencies sometimes delay scheduled procedures.  2. Diet: Do not eat solid foods after midnight.  The patient may have clear liquids until 5am upon the day of the procedure.  3. Labs: You will need to have blood drawn on Wednesday, October 30 at [place]  Open: [place] (Lunch 12:30 - 1:30)   Phone: [phone]. You do not need to be fasting.  4. Medication instructions in preparation for your procedure:  Contrast Allergy: No   HOLD Losartan-HCT on the day of your procedure (12/20/17)  HOLD Metformin and Glipizide on the day of your procedure (12/20/17) and continue HOLDING 48 HOURS AFTER THE PROCEDURE  On the morning of your procedure, take Aspirin 81 mg and any morning medicines NOT listed above.  You may use sips of water.  5. Plan for one  night stay--bring personal belongings.  6. Bring a current list of your medications and current insurance cards.  7. You MUST have a responsible person to drive you home.  8. Someone MUST be with you the first 24 hours after you arrive home or your discharge will be delayed.  9. Please wear clothes that are easy to get on and off and wear slip-on shoes.  Thank you for allowing [place] to care for you!   -- [facility] Invasive Cardiovascular services     Signed, Sanda Klein, MD  12/05/2017 5:36 PM    [address] [place], [place], [place]  [address] Phone: [phone]; Fax: [phone]

## 2017-12-05 NOTE — Patient Instructions (Signed)
Medication Instructions:  Dr Sallyanne Kuster recommends that you continue on your current medications as directed. Please refer to the Current Medication list given to you today.  If you need a refill on your cardiac medications before your next appointment, please call your pharmacy.   Lab work: Your physician recommends that you return for lab work TODAY. If you have labs (blood work) drawn today and your tests are completely normal, you will receive your results only by: [name] Kitchen MyChart Message (if you have MyChart) OR . A paper copy in the mail If you have any lab test that is abnormal or we need to change your treatment, we will call you to review the results.  Testing/Procedures: Remote monitoring is used to monitor your Pacemaker of ICD from home. This monitoring reduces the number of office visits required to check your device to one time per year. It allows [place] to keep an eye on the functioning of your device to ensure it is working properly. You are scheduled for a device check from home on Wednesday, December 4th, 2019. You may send your transmission at any time that day. If you have a wireless device, the transmission will be sent automatically. After your physician reviews your transmission, you will receive a postcard with your next transmission date.  Your physician has requested that you have a cardiac catheterization. Cardiac catheterization is used to diagnose and/or treat various heart conditions. Doctors may recommend this procedure for a number of different reasons. The most common reason is to evaluate chest pain. Chest pain can be a symptom of coronary artery disease (CAD), and cardiac catheterization can show whether plaque is narrowing or blocking your heart's arteries. This procedure is also used to evaluate the valves, as well as measure the blood flow and oxygen levels in different parts of your heart. For further information please visit [email or web address]. Please follow instruction  sheet, as given.  Follow-Up: At [facility], you and your health needs are our priority.  As part of our continuing mission to provide you with exceptional heart care, we have created designated Provider Care Teams.  These Care Teams include your primary Cardiologist (physician) and Advanced Practice Providers (APPs -  Physician Assistants and Nurse Practitioners) who all work together to provide you with the care you need, when you need it. You will need a follow up appointment in 6 months.  Please call our office 2 months in advance to schedule this appointment.  You may see Sanda Klein, MD or one of the following Advanced Practice Providers on your designated Care Team: [place], [place] . Fabian Sharp, PA-C  Any Other Special Instructions Will Be Listed Below (If Applicable).   [address] [place] [address] Dept: [phone] Loc: [place]  12/05/2017  You are scheduled for a Cardiac Catheterization on Thursday, November 14 with Dr. Glenetta Hew.  1. Please arrive at the [facility] (Main Entrance A) at [facility]: [address] [place], [place] [address] at 7:00 AM (This time is two hours before your procedure to ensure your preparation). Free valet parking service is available.   Special note: Every effort is made to have your procedure done on time. Please understand that emergencies sometimes delay scheduled procedures.  2. Diet: Do not eat solid foods after midnight.  The patient may have clear liquids until 5am upon the day of the procedure.  3. Labs: You will  need to have blood drawn on Wednesday, October 30 at [facility]  Open: [place] (Lunch 12:30 - 1:30)   Phone: [phone]. You do not need to be fasting.  4. Medication instructions in preparation for your procedure:  Contrast Allergy: No    HOLD Losartan-HCT on the day of your procedure (12/20/17)  HOLD Metformin and Glipizide on the day of your procedure (12/20/17) and continue HOLDING 48 HOURS AFTER THE PROCEDURE  On the morning of your procedure, take Aspirin 81 mg and any morning medicines NOT listed above.  You may use sips of water.  5. Plan for one night stay--bring personal belongings.  6. Bring a current list of your medications and current insurance cards.  7. You MUST have a responsible person to drive you home.  8. Someone MUST be with you the first 24 hours after you arrive home or your discharge will be delayed.  9. Please wear clothes that are easy to get on and off and wear slip-on shoes.  Thank you for allowing [place] to care for you!   -- [facility]

## 2017-12-13 ENCOUNTER — Ambulatory Visit
Admission: RE | Admit: 2017-12-13 | Discharge: 2017-12-13 | Disposition: A | Discharge status: Home / Self Care | Payer: Medicare HMO | Source: Ambulatory Visit | Attending: Neurological Surgery | Admitting: Neurological Surgery

## 2017-12-13 DIAGNOSIS — M48061 Spinal stenosis, lumbar region without neurogenic claudication: Secondary | ICD-10-CM

## 2017-12-13 MED ORDER — DIAZEPAM 5 MG PO TABS
5.0000 mg | ORAL_TABLET | Freq: Once | ORAL | Status: AC
  Administered 2017-12-13: 5 mg via ORAL

## 2017-12-13 MED ORDER — IOPAMIDOL (ISOVUE-M 200) INJECTION 41%
15.0000 mL | Freq: Once | INTRAMUSCULAR | Status: AC
  Administered 2017-12-13: 15 mL via INTRATHECAL

## 2017-12-13 NOTE — Discharge Instructions (Signed)

## 2017-12-17 ENCOUNTER — Telehealth: Payer: Self-pay | Admitting: Cardiovascular Disease

## 2017-12-17 NOTE — Telephone Encounter (Signed)
Returned call to patient. Reports he saw his neurosurgeon today. Per patient, neurosurgeon would like to hold off on catheterization at this time.  Dr Sallyanne Kuster reviewed recent myelogram and would like to review patient's office note from office visit today.   Request office note from Dr Sherley Bounds - [name] phone [phone].

## 2017-12-17 NOTE — Telephone Encounter (Signed)
New Message:     Please call, pt is scheduled for Cath on Thursday.He just left his Orthopedic doctor, needs to tell you about what was said in his office visit today,

## 2017-12-18 NOTE — Telephone Encounter (Signed)
Spoke to Medical Records at [facility]. They are faxing office visit note from yesterday, 12/17/17 ATTN: Enid Cutter, CMA

## 2017-12-19 NOTE — Telephone Encounter (Signed)
Records received and reviewed by [facility]   Cath cancelled.

## 2017-12-20 ENCOUNTER — Ambulatory Visit (HOSPITAL_COMMUNITY): Admission: RE | Admit: 2017-12-20 | Payer: Medicare HMO | Source: Ambulatory Visit | Admitting: Cardiology

## 2017-12-20 ENCOUNTER — Encounter (HOSPITAL_COMMUNITY): Admission: RE | Payer: Self-pay | Source: Ambulatory Visit

## 2017-12-20 SURGERY — LEFT HEART CATH AND CORS/GRAFTS ANGIOGRAPHY
Anesthesia: LOCAL

## 2018-01-09 ENCOUNTER — Ambulatory Visit (INDEPENDENT_AMBULATORY_CARE_PROVIDER_SITE_OTHER): Payer: Medicare HMO

## 2018-01-09 DIAGNOSIS — I495 Sick sinus syndrome: Secondary | ICD-10-CM

## 2018-01-09 NOTE — Progress Notes (Signed)
Remote pacemaker transmission.   

## 2018-01-14 ENCOUNTER — Ambulatory Visit: Payer: Medicare HMO | Admitting: Vascular Surgery

## 2018-01-14 ENCOUNTER — Encounter: Payer: Self-pay | Admitting: Vascular Surgery

## 2018-01-14 VITALS — BP 155/75 | HR 71 | Temp 97.7°F | Resp 20 | Ht 60.0 in | Wt 228.2 lb

## 2018-01-14 DIAGNOSIS — M5136 Other intervertebral disc degeneration, lumbar region: Secondary | ICD-10-CM | POA: Diagnosis not present

## 2018-01-14 NOTE — Progress Notes (Signed)
Vascular and Vein Specialist of [facility]  Patient name: Alan [name] MRN: [id number] DOB: 12-17-1940 Sex: male  REASON FOR CONSULT: Discuss anterior exposure for L4-5 disc surgery with Dr. Sherley Bounds  HPI: Alan MCLOUD is a 77 y.o. 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  Had multiple prior posterior disc surgery.  He has had recurrent issues regarding pain in both his back and both lower extremities.  This is been progressively worse.  He is seen today for discussion of anterior exposure.  Does have a prior history of coronary artery disease and also atrial fibrillation  Past Medical History:  Diagnosis Date  . A-fib ([place])   . Anticoagulant long-term use, on coumadin for PAF 03/08/2012  . Chronic back pain   . Complete heart block ([place])   . Coronary artery disease    seen at [facility] cardiology   . Diabetes mellitus   . History of kidney stones   . Hx of echocardiogram 06/29/2008   EF 45-50%   . Hypertension    Dr. Veronia Beets, primary  . Neuromuscular disorder ([place])    carpal tunnel bilaterally  . 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 Polio    hx of  . Sleep apnea    cpap, 11, last sleep study Jan2013, sees Dr. Keturah Barre  . Status post placement of cardiac pacemaker, 03/07/12 New Medtronic Gen and new Vent lead original placed 2008 03/08/2012    Family History  Problem Relation Age of Onset  . Other Father        MVA  . Cancer Mother     SOCIAL HISTORY: Social History   Socioeconomic History  . Marital status: Single    Spouse name: Not on file  . Number of children: 1  . Years of education: 46  . Highest education level: Not on file  Occupational History  . Occupation: pilot-retired  Social Needs  . Financial resource strain: Not on file  . Food insecurity:    Worry: Not on file    Inability: Not on file  . Transportation needs:    Medical: Not on  file    Non-medical: Not on file  Tobacco Use  . Smoking status: Never Smoker  . Smokeless tobacco: Never Used  Substance and Sexual Activity  . Alcohol use: Yes    Comment: rarely  . Drug use: No  . Sexual activity: Yes  Lifestyle  . Physical activity:    Days per week: Not on file    Minutes per session: Not on file  . Stress: Not on file  Relationships  . Social connections:    Talks on phone: Not on file    Gets together: Not on file    Attends religious service: Not on file    Active member of club or organization: Not on file    Attends meetings of clubs or organizations: Not on file    Relationship status: Not on file  . Intimate partner violence:    Fear of current or ex partner: Not on file    Emotionally abused: Not on file    Physically abused: Not on file    Forced sexual activity: Not on file  Other Topics Concern  . Not on file  Social History Narrative   [place] [address] [place] [facility], fishes.    Right-handed.   1 cup caffeine per day.    No Known  Allergies  Current Outpatient Medications  Medication Sig Dispense Refill  . amLODipine (NORVASC) 10 MG tablet Take 10 mg by mouth daily.     [name] Kitchen aspirin EC 81 MG tablet Take 81 mg by mouth daily.    . Calcium Carbonate-Vitamin D (CALTRATE 600+D) 600-400 MG-UNIT tablet Take 1 tablet by mouth 2 (two) times a week.     . carvedilol (COREG) 25 MG tablet TAKE 2 TABLETS BY MOUTH TWICE DAILY (Patient taking differently: Take 50 mg by mouth 2 (two) times daily with a meal. ) 360 tablet 3  . cyanocobalamin ([employer] VITAMIN B12) 1000 MCG tablet Take 1,000 mcg by mouth once a week.     [name] Kitchen glipiZIDE (GLUCOTROL) 10 MG tablet Take 10 mg by mouth 2 (two) times daily.     [name] Kitchen losartan-hydrochlorothiazide (HYZAAR) 100-25 MG tablet Take 1 tablet by mouth daily.     . metFORMIN (GLUCOPHAGE) 500 MG tablet Take 500 mg by mouth 4 (four) times daily.      No current facility-administered medications for this visit.     REVIEW OF  SYSTEMS:  [X]  denotes positive finding, [ ]  denotes negative finding Cardiac  Comments:  Chest pain or chest pressure:    Shortness of breath upon exertion:    Short of breath when lying flat:    Irregular heart rhythm:        Vascular    Pain in calf, thigh, or hip brought on by ambulation: x  neurogenic  Pain in feet at night that wakes you up from your sleep:     Blood clot in your veins:    Leg swelling:         Pulmonary    Oxygen at home:    Productive cough:     Wheezing:         Neurologic    Sudden weakness in arms or legs:     Sudden numbness in arms or legs:     Sudden onset of difficulty speaking or slurred speech:    Temporary loss of vision in one eye:     Problems with dizziness:         Gastrointestinal    Blood in stool:     Vomited blood:         Genitourinary    Burning when urinating:     Blood in urine:        Psychiatric    Major depression:         Hematologic    Bleeding problems:    Problems with blood clotting too easily:        Skin    Rashes or ulcers:        Constitutional    Fever or chills:      PHYSICAL EXAM: Vitals:   01/14/18 1422  BP: (!) 155/75  Pulse: 71  Resp: 20  Temp: 97.7 F (36.5 C)  TempSrc: Temporal  Weight: 228 lb 3.2 oz (103.5 kg)  Height: 5' (1.524 m)    GENERAL: The patient is a well-nourished male, in no acute distress. The vital signs are documented above. CARDIOVASCULAR: 2+ radial 2+ femoral and 2+ popliteal pulses bilaterally.  I do not palpate pedal pulses PULMONARY: There is good air exchange  ABDOMEN: Soft and non-tender  MUSCULOSKELETAL: There are no major deformities or cyanosis. NEUROLOGIC: No focal weakness or paresthesias are detected. SKIN: There are no ulcers or rashes noted. PSYCHIATRIC: The patient has a normal affect.  DATA:  Lumbar CT from  November 2019 was reviewed.  This does show moderate calcification of his aortic and iliac vessels.  MEDICAL ISSUES: I discussed my role for  anterior exposure for our surgery.  He has not had any prior intra-abdominal surgery.  Has had remote history of hernia repair.  Does have moderate obesity but this is not prohibitive.  I am concerned regarding his level of calcification.  I explained the potential for injury to his aortic and iliac segments with manipulation of his aorta.  I do not feel that this is necessarily prohibitive with the degree he has but certainly does put him at increased risk for arterial injury.  I explained that I will discuss this further with Dr. Ronnald Ramp to determine other options available versus proceeding with anterior exposure.  I did explain the typical anterior exposure with a transverse incision in the left abdomen over the 4 5 disc with mobilization of intraperitoneal contents and vascular structures overlying the spine.  We will make further recommendations pending my discussion with Dr. Richardson Landry, MD [facility] Specialists of [facility] [phone] Pager [phone]

## 2018-01-15 ENCOUNTER — Encounter: Payer: Self-pay | Admitting: Cardiology

## 2018-02-10 LAB — CUP PACEART REMOTE DEVICE CHECK
Battery Impedance: 304 Ohm
Battery Voltage: 2.79 V
Brady Statistic AP VP Percent: 2 %
Brady Statistic AS VS Percent: 2 %
Implantable Lead Implant Date: 20140130
Implantable Lead Location: 753860
Implantable Lead Model: 5076
Implantable Lead Model: 5076
Implantable Pulse Generator Implant Date: 20140130
Lead Channel Impedance Value: 448 Ohm
Lead Channel Impedance Value: 473 Ohm
Lead Channel Pacing Threshold Pulse Width: 0.4 ms
Lead Channel Setting Pacing Amplitude: 2.5 V
Lead Channel Setting Pacing Pulse Width: 0.4 ms
MDC IDC LEAD IMPLANT DT: 20080208
MDC IDC LEAD LOCATION: 753859
MDC IDC MSMT BATTERY REMAINING LONGEVITY: 105 mo
MDC IDC MSMT LEADCHNL RA PACING THRESHOLD AMPLITUDE: 0.625 V
MDC IDC MSMT LEADCHNL RV PACING THRESHOLD AMPLITUDE: 0.75 V
MDC IDC MSMT LEADCHNL RV PACING THRESHOLD PULSEWIDTH: 0.4 ms
MDC IDC SESS DTM: 20191204130327
MDC IDC SET LEADCHNL RA PACING AMPLITUDE: 2 V
MDC IDC SET LEADCHNL RV SENSING SENSITIVITY: 2 mV
MDC IDC STAT BRADY AP VS PERCENT: 96 %
MDC IDC STAT BRADY AS VP PERCENT: 0 %

## 2018-02-12 ENCOUNTER — Other Ambulatory Visit: Payer: Self-pay | Admitting: Neurological Surgery

## 2018-02-12 ENCOUNTER — Telehealth: Payer: Self-pay | Admitting: *Deleted

## 2018-02-12 NOTE — Telephone Encounter (Signed)
   [facility] Pre-operative Risk Assessment    Request for surgical clearance:  1. What type of surgery is being performed?  L4-5 LUMBAR FUSION,L2-S1 SEGMENTAL FIXATION   2. When is this surgery scheduled? Monday 03/11/2018   3. What type of clearance is required (medical clearance vs. Pharmacy clearance to hold med vs. Both)?  MEDICAL  4. Are there any medications that need to be held prior to surgery and how long? aspirin 81 mg  5. Practice name and name of physician performing surgery?  [facility]; DR DAVID S.JONES  6. What is your office phone number [phone] EXT [place]   7.   What is your office fax number [phone]   8.   Anesthesia type (None, local, MAC, general) ? GENERAL   Raiford Simmonds 02/12/2018, 3:09 PM  _________________________________________________________________   (provider comments below)

## 2018-02-13 NOTE — Telephone Encounter (Signed)
Dr. Sallyanne Kuster, looks like you cancelled cath so pt could have spine surgery. Can you comment on clearance for the procedure please. Also can aspirin be held for procedure.   Please route response back to P CV DIV PREOP  Thanks, Gae Bon

## 2018-02-14 NOTE — Telephone Encounter (Signed)
I called Alan [name].  He is currently not having any cardiac symptoms.  He is scheduled to have a simpler procedure than initially planned with exclusively posterior approach.  I think he can have that procedure with a low-moderate risk.  Do not need to do a heart catheterization at this time.  He can hold aspirin for 1-2 weeks before the procedure. MCr

## 2018-02-20 ENCOUNTER — Telehealth: Payer: Self-pay | Admitting: Internal Medicine

## 2018-02-20 NOTE — Telephone Encounter (Signed)
Pt will ask for me directly.

## 2018-02-22 NOTE — Telephone Encounter (Signed)
I was able to reach patient today-apt made for Tuesday 02/26/2018 at 11:30am for surgery clearance. Pt is aware of address and location of new office. Nothing more needed at this time.

## 2018-02-26 ENCOUNTER — Encounter: Payer: Self-pay | Admitting: Internal Medicine

## 2018-02-26 ENCOUNTER — Ambulatory Visit: Payer: Medicare HMO | Admitting: Internal Medicine

## 2018-02-26 VITALS — BP 138/70 | HR 93 | Ht 70.0 in | Wt 203.4 lb

## 2018-02-26 DIAGNOSIS — G4733 Obstructive sleep apnea (adult) (pediatric): Secondary | ICD-10-CM | POA: Diagnosis not present

## 2018-02-26 DIAGNOSIS — R05 Cough: Secondary | ICD-10-CM | POA: Diagnosis not present

## 2018-02-26 DIAGNOSIS — Z01811 Encounter for preprocedural respiratory examination: Secondary | ICD-10-CM

## 2018-02-26 DIAGNOSIS — R059 Cough, unspecified: Secondary | ICD-10-CM

## 2018-02-26 NOTE — Patient Instructions (Signed)
You are clear to proceed with necessary surgery from a pulmonary/ sleep medical perspective.  You should wear CPAP 12 when sleeping at the hospital and as usual at home.  We can continue CPAP 12, mask of choice, humidifier, supplies, airView or download card.  Please call if we can help

## 2018-02-26 NOTE — Assessment & Plan Note (Signed)
He is clear from pulmonary standpoint for pending surgery.

## 2018-02-26 NOTE — Assessment & Plan Note (Addendum)
He continues to benefit from CPAP with improved sleep quality and no concerns. Plan-replace download card.  Continue CPAP 12 He should wear CPAP 12 through his hospital stay while sleeping.

## 2018-02-26 NOTE — Progress Notes (Signed)
HPI M never smoker followed for OSA, history UPPP. Complicated by Post polio, AFib/ pacemaker, CAD/CABG,  DM 2, HBP, gout,  DDD,      PCP Dr Melford Aase NPSG 05/30/97- AHI 20/ hr, desaturation to 78%, body weight 230 lbs  ------------------------------------------------------------------------------ 01/22/14- 72 yoM never smoker followed for OSA, history UPPP. Complicated by Post polio, AFib/ pacemaker   PCP Dr Melford Aase FOLLOWS RKY:HCWCB CPAP 12/ Advanced every night  We had changed CPAP pressure to 12 and he likes this better.. Never sleeps without CPAP all night. Incidental fall, cracked ribs and hurt right shoulder but healing without surgery.  02/26/2018- 77 yoM never smoker followed for OSA, history UPPP. Complicated by Post polio, AFib/ pacemaker, CAD/CABG,  DM 2, HBP, gout, PCP Dr Melford Aase NPSG 05/30/97- ASHI 20/ hr, desaturation to 78%, body weight 230 lbs CPAP 12/ Advanced -----OSA and cough; Last seen 01/2014; here for surgery clearance for lumbar fusion on 03/11/18. He denies any breathing problems, dyspnea or cough at this time.  No acute problems other than back pain.  He reports pacemaker has been working well. CXR is pending. He continues very compliant with CPAP, used all night every night. CXR-05/09/2016- IMPRESSION: No edema or consolidation. Status post pacemaker placement and coronary artery bypass grafting. Aortic atherosclerosis. Office Spirometry-02/26/2018- Mild Restriction of exhaled volume.  FVC 2.9/69%, FEV1 2.4/81%, ratio 1.84, FEF 25-75% 3.1/145%.  ROS-HPI  + = positive Constitutional:   No-   weight loss, night sweats, fevers, chills, fatigue, lassitude. HEENT:   No-  headaches, difficulty swallowing, tooth/dental problems, sore throat,       No-  sneezing, itching, ear ache, nasal congestion, post nasal drip,  CV:  No-   chest pain, orthopnea, PND, swelling in lower extremities, anasarca, dizziness, palpitations Resp: No-   shortness of breath with exertion or at rest,   cough            No-   No- coughing up of blood.              No-   change in color of mucus.  No- wheezing.   Skin: No-   rash or lesions. GI:  No-   heartburn, indigestion, abdominal pain, nausea, vomiting,  GU:  MS:  No-   joint pain or swelling.  + back pain Neuro-     nothing unusual Psych:  No- change in mood or affect. No depression or anxiety.  No memory loss.  OBJ General- Alert, Oriented, Affect-appropriate, Distress- none acute, overweight Skin- rash-none, lesions- none, excoriation- none Lymphadenopathy- none Head- atraumatic            Eyes- Gross vision intact, PERRLA, conjunctivae clear secretions            Ears- Hearing, canals-normal            Nose- Clear, no-Septal dev, mucus, polyps, erosion, perforation             Throat- Mallampati III/ +sp UPPP , mucosa clear , drainage- none, tonsils- atrophic Neck- flexible , trachea midline, no stridor , thyroid nl, carotid no bruit Chest - symmetrical excursion , unlabored           Heart/CV- RRR , no murmur , no gallop  , no rub, nl s1 s2                           - JVD- none , edema- none, stasis changes- none, varices- none  Lung- + trace crackle L base, wheeze- none, cough- none , dullness-none, rub- none           Chest wall-  + pacemaker L Abd- Br/ Gen/ Rectal- Not done, not indicated Extrem- + decreased range of motion right shoulder when he extends arm shake hands Neuro- grossly intact to observation

## 2018-03-04 NOTE — Pre-Procedure Instructions (Signed)
Alan [name]  03/04/2018      [facility] [place], [place] - [name] [place] [address] Phone: [phone] Fax: [phone]    Your procedure is scheduled on March 11, 2018.  Report to [facility] Admitting at 530 AM.  Call this number if you have problems the morning of surgery:  [phone]   Remember:  Do not eat or drink after midnight.    Take these medicines the morning of surgery with A SIP OF WATER  Amlodipine (norvasc) Carvedilol (coreg) Colchicine Refresh eye drops-if needed Flonase nasal spray Pantoprazole (protonix)   7 days prior to surgery STOP taking any diclofenac (voltaren), Aspirin (unless otherwise instructed by your surgeon), Aleve, Naproxen, Ibuprofen, Motrin, Advil, Goody's, BC's, all herbal medications, fish oil, and all vitamins  WHAT DO I DO ABOUT MY DIABETES MEDICATION?  [name] Kitchen Do not take oral diabetes medicines (pills) the morning of surgery-metformin (glucophage) or glipizide (glucotrol).  . The day before surgery take only your morning dose of glipizide, do not take evening dose.  Reviewed and Endorsed by [facility] Patient Education Committee, August 2015   How to Manage Your Diabetes Before and After Surgery  Why is it important to control my blood sugar before and after surgery? . Improving blood sugar levels before and after surgery helps healing and can limit problems. . A way of improving blood sugar control is eating a healthy diet by: o  Eating less sugar and carbohydrates o  Increasing activity/exercise o  Talking with your doctor about reaching your blood sugar goals . High blood sugars (greater than 180 mg/dL) can raise your risk of infections and slow your recovery, so you will need to focus on controlling your diabetes during the weeks before surgery. . Make sure that the doctor who takes care of your diabetes knows about your planned surgery including the  date and location.  How do I manage my blood sugar before surgery? . Check your blood sugar at least 4 times a day, starting 2 days before surgery, to make sure that the level is not too high or low. o Check your blood sugar the morning of your surgery when you wake up and every 2 hours until you get to the Short Stay unit. . If your blood sugar is less than 70 mg/dL, you will need to treat for low blood sugar: o Do not take insulin. o Treat a low blood sugar (less than 70 mg/dL) with  cup of clear juice (cranberry or apple), 4 glucose tablets, OR glucose gel. Recheck blood sugar in 15 minutes after treatment (to make sure it is greater than 70 mg/dL). If your blood sugar is not greater than 70 mg/dL on recheck, call [phone] o  for further instructions. . Report your blood sugar to the short stay nurse when you get to Short Stay.  . If you are admitted to the hospital after surgery: o Your blood sugar will be checked by the staff and you will probably be given insulin after surgery (instead of oral diabetes medicines) to make sure you have good blood sugar levels. o The goal for blood sugar control after surgery is 80-180 mg/dL.   [facility]- Preparing For Surgery  Before surgery, you can play an important role. Because skin is not sterile, your skin needs to be as free of germs as possible. You can reduce the number of germs on your skin by washing with  CHG (chlorahexidine gluconate) Soap before surgery.  CHG is an antiseptic cleaner which kills germs and bonds with the skin to continue killing germs even after washing.    Oral Hygiene is also important to reduce your risk of infection.  Remember - BRUSH YOUR TEETH THE MORNING OF SURGERY WITH YOUR REGULAR TOOTHPASTE  Please do not use if you have an allergy to CHG or antibacterial soaps. If your skin becomes reddened/irritated stop using the CHG.  Do not shave (including legs and underarms) for at least 48 hours prior to first CHG  shower. It is OK to shave your face.  Please follow these instructions carefully.   1. Shower the NIGHT BEFORE SURGERY and the MORNING OF SURGERY with CHG.   2. If you chose to wash your hair, wash your hair first as usual with your normal shampoo.  3. After you shampoo, rinse your hair and body thoroughly to remove the shampoo.  4. Use CHG as you would any other liquid soap. You can apply CHG directly to the skin and wash gently with a scrungie or a clean washcloth.   5. Apply the CHG Soap to your body ONLY FROM THE NECK DOWN.  Do not use on open wounds or open sores. Avoid contact with your eyes, ears, mouth and genitals (private parts). Wash Face and genitals (private parts)  with your normal soap.  6. Wash thoroughly, paying special attention to the area where your surgery will be performed.  7. Thoroughly rinse your body with warm water from the neck down.  8. DO NOT shower/wash with your normal soap after using and rinsing off the CHG Soap.  9. Pat yourself dry with a CLEAN TOWEL.  10. Wear CLEAN PAJAMAS to bed the night before surgery, wear comfortable clothes the morning of surgery  11. Place CLEAN SHEETS on your bed the night of your first shower and DO NOT SLEEP WITH PETS.  Day of Surgery:  Do not apply any deodorants/lotions.  Please wear clean clothes to the hospital/surgery center.   Remember to brush your teeth WITH YOUR REGULAR TOOTHPASTE.    Do not wear jewelry  Do not wear lotions, powders, or colognes, or deodorant.  Men may shave face and neck.  Do not bring valuables to the hospital.  [facility] is not responsible for any belongings or valuables.  Contacts, dentures or bridgework may not be worn into surgery.  Leave your suitcase in the car.  After surgery it may be brought to your room.  For patients admitted to the hospital, discharge time will be determined by your treatment team.  Patients discharged the day of surgery will not be allowed to drive  home.   Please read over the following fact sheets that you were given.

## 2018-03-05 ENCOUNTER — Encounter (HOSPITAL_COMMUNITY): Payer: Self-pay

## 2018-03-05 ENCOUNTER — Ambulatory Visit (HOSPITAL_COMMUNITY)
Admission: RE | Admit: 2018-03-05 | Discharge: 2018-03-05 | Disposition: A | Discharge status: Home / Self Care | Payer: Medicare HMO | Source: Ambulatory Visit | Attending: Neurological Surgery | Admitting: Neurological Surgery

## 2018-03-05 ENCOUNTER — Encounter (HOSPITAL_COMMUNITY)
Admission: RE | Admit: 2018-03-05 | Discharge: 2018-03-05 | Disposition: A | Discharge status: Home / Self Care | Payer: Medicare HMO | Source: Ambulatory Visit | Attending: Neurological Surgery | Admitting: Neurological Surgery

## 2018-03-05 DIAGNOSIS — M48061 Spinal stenosis, lumbar region without neurogenic claudication: Secondary | ICD-10-CM

## 2018-03-05 LAB — CBC WITH DIFFERENTIAL/PLATELET
Abs Immature Granulocytes: 0.07 10*3/uL (ref 0.00–0.07)
Basophils Absolute: 0.1 10*3/uL (ref 0.0–0.1)
Basophils Relative: 1 %
Eosinophils Absolute: 0.1 10*3/uL (ref 0.0–0.5)
Eosinophils Relative: 1 %
HCT: 33.1 % — ABNORMAL LOW (ref 39.0–52.0)
Hemoglobin: 9.8 g/dL — ABNORMAL LOW (ref 13.0–17.0)
Immature Granulocytes: 1 %
Lymphocytes Relative: 13 %
Lymphs Abs: 1.5 10*3/uL (ref 0.7–4.0)
MCH: 27.1 pg (ref 26.0–34.0)
MCHC: 29.6 g/dL — AB (ref 30.0–36.0)
MCV: 91.7 fL (ref 80.0–100.0)
Monocytes Absolute: 0.9 10*3/uL (ref 0.1–1.0)
Monocytes Relative: 8 %
NRBC: 0 % (ref 0.0–0.2)
Neutro Abs: 8.4 10*3/uL — ABNORMAL HIGH (ref 1.7–7.7)
Neutrophils Relative %: 76 %
Platelets: 278 10*3/uL (ref 150–400)
RBC: 3.61 MIL/uL — ABNORMAL LOW (ref 4.22–5.81)
RDW: 14.3 % (ref 11.5–15.5)
WBC: 11.1 10*3/uL — ABNORMAL HIGH (ref 4.0–10.5)

## 2018-03-05 LAB — PROTIME-INR
INR: 1.17
Prothrombin Time: 14.8 seconds (ref 11.4–15.2)

## 2018-03-05 LAB — BASIC METABOLIC PANEL
Anion gap: 13 (ref 5–15)
BUN: 34 mg/dL — ABNORMAL HIGH (ref 8–23)
CHLORIDE: 107 mmol/L (ref 98–111)
CO2: 16 mmol/L — ABNORMAL LOW (ref 22–32)
CREATININE: 1.52 mg/dL — AB (ref 0.61–1.24)
Calcium: 9.9 mg/dL (ref 8.9–10.3)
GFR calc Af Amer: 51 mL/min — ABNORMAL LOW (ref >60)
GFR calc non Af Amer: 44 mL/min — ABNORMAL LOW (ref >60)
Glucose, Bld: 119 mg/dL — ABNORMAL HIGH (ref 70–99)
Potassium: 4.6 mmol/L (ref 3.5–5.1)
Sodium: 136 mmol/L (ref 135–145)

## 2018-03-05 LAB — SURGICAL PCR SCREEN
MRSA, PCR: NEGATIVE
Staphylococcus aureus: NEGATIVE

## 2018-03-05 LAB — GLUCOSE, CAPILLARY: Glucose-Capillary: 117 mg/dL — ABNORMAL HIGH (ref 70–99)

## 2018-03-05 MED ORDER — CHLORHEXIDINE GLUCONATE CLOTH 2 % EX PADS: 6.0000 | MEDICATED_PAD | Freq: Once | CUTANEOUS | Status: DC

## 2018-03-05 NOTE — Progress Notes (Signed)
PCP - Dr Melford Aase Cardiologist - Dr Sallyanne Kuster   Clearance in epic  Chest x-ray - 03/05/18 EKG - 9/19 Stress Test -  ECHO - 2015 Cardiac Cath -   Sleep Study - "sev yrs ago"    1999 CPAP - yes  Fasting Blood Sugar -  Checks Blood Sugar _____ times a day  Blood Thinner Instructions: Aspirin Instructions:stppped 1/28  Anesthesia review: has pacemaker,form faxed  Patient denies shortness of breath, fever, cough and chest pain at PAT appointment   Patient verbalized understanding of instructions that were given to them at the PAT appointment. Patient was also instructed that they will need to review over the PAT instructions again at home before surgery.

## 2018-03-06 ENCOUNTER — Ambulatory Visit: Payer: Medicare HMO | Admitting: Adult Health

## 2018-03-06 LAB — CUP PACEART INCLINIC DEVICE CHECK
Date Time Interrogation Session: 20200129153441
Implantable Lead Implant Date: 20080208
Implantable Lead Implant Date: 20140130
Implantable Lead Location: 753859
Implantable Lead Location: 753860
Implantable Lead Model: 5076
Implantable Lead Model: 5076
Implantable Pulse Generator Implant Date: 20140130

## 2018-03-06 NOTE — Progress Notes (Addendum)
Anesthesia Chart Review:  Case:  [id number] Date/Time:  03/11/18 0930   Procedure:  PLIF - L4-L5, segmental fixation L2-S1 (N/A Back)   Anesthesia type:  General   Pre-op diagnosis:  Stenosis   Location:  [facility] OR ROOM 28 / [place] OR   Surgeon:  [name], MD      DISCUSSION: Patient is a 78 year old male scheduled for the above procedure.  History includes non-smoker, CAD (s/p CABG 1990 [LIMA-LAD-DIAG]; multiple RCA PCI 1990-2004; redo CABG [SVG-RCA-PDA] with MAZE 2004, Dr. Amador Cunas), complete heart block (s/p Medtronic PPM 03/16/06 with generator change 03/07/12), OSA (s/p UPPP with CPAP use), afib (DCCV 06/23/10; warfarin d/c'd 2015 d/t GIB; although recurrent bleed off anticoagulants s/p cauterization gastric lesion ~ 05/2013; GI bleed 02/04/18 and 02/15/18), post-polio syndrome (effecting LLE), DM2, HTN, back surgeries (L3-4 laminectomy 01/10/12; L3-4 PLIF 07/2012; removal L3-4 posterior instrumentation and decompressive hemilaminectomy/foraminotomoy L2-3/L4-5 05/18/16; L2-3 PLIF 02/22/17).   - Admission [facility] 02/15/18 - 02/20/18 for GI bleed and pedal edema. Also treated for acute on chronic kidney disease. He had small bowel enteroscopy and colonoscopy with no evidence of bleeding. Transfusion 02/19/18 for HGB 7.8. Out-patient capsule endoscopy planned. CXR showed mild edema, LLE [place] negative for DVT, echo showed normal LVEF, moderate TR, moderate pulmonary hypertension. He was treated with diuretics, pain medication, and colchicine.  - Admission [facility] 02/04/18 - 02/06/18 for GI bleed with black tarry stools for 3-4 days. HGB 7.6, s/p 2 Units PRBC. 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 He was discharged on PPI and iron with instructions to hold ASA for 1 week.   - On 02/14/18 cardiologist Dr. Sallyanne Kuster wrote, "I called Mr. Coykendall.  He is currently not having any cardiac symptoms.  He is scheduled to have a simpler procedure than  initially planned with exclusively posterior approach.  I think he can have that procedure with a low-moderate risk.  Do not need to do a heart catheterization at this time.  He can hold aspirin for 1-2 weeks before the procedure."  - On 02/27/08 he was seen by pulmonologist Dr Annamaria Boots who wrote, "He is clear from pulmonary standpoint for pending surgery." However, since then he was seen at his PCP office on 03/01/17 and treated with 10 day course of Augmentin for "bacterial URI". Lung sounds documented as "normal."  - He remains anemic following his GI bleed within the past month.  Date     H/H 03/05/18   9.8/33.1 02/25/18   9.3/28.4 02/22/18   9.4/28.5 02/21/18   09/12/25.9 02/20/18   8.2/26.4 02/19/18   7.8/25.4 He will need T&S on the day of surgery given his recent transfusions (02/19/18, 02/14/18, 02/05/18, 02/04/18).  - 02/14/18 EKG at [facility] was interpreted as afib, and our PAT staff did not repeat an EKG at his PAT visit. Although with history of afib, he did not have any on PPM interrogation at the time of his 11/2017 cardiology. On his 01/09/18 interrogation, however, he did have "5 suggested AF events <42minute duration no egms for review. NO recent HVR events." His next interrogation is due 04/10/18. Awaiting EKG tracing from [facility]. Since EKG not repeated, I'm not sure if he remains in afib. Will reach out to Dr. Sallyanne Kuster to determine if any additional recommendations if patient does present for surgery back in afib. HR was 81 bpm with vitals.   Will notify surgeon of CBC results and attempt  to reach patient to follow-up regarding URI symptoms. Will plan to discuss with anesthesiologist once additional input received.  ADDENDUM 03/08/18 9:52 AM: I reviewed chart with anesthesiologist Oren Bracket, MD on 03/07/18 who agreed with getting additional input from cardiology regarding [facility] EKG, otherwise if PRBCs available for surgery if needed and if patient recovered from  URI then it would be anticipated that patient could proceed as planned.   On 03/07/18 I also notified Lorriane Shire at Dr. Ronnald Ramp' office regarding H/H and recent treatment for URI.  Orders for T&S and prepare PRBC 2 Units have been entered for the day of surgery. This morning I also called patient who reports he will complete antibiotic this weekend and denied SOB, cough, fever. I also heard back from Dr. Sallyanne Kuster regarding afib (according to results narrative) on EKG from [place]. His staff contacted patient regarding remote transmission. Per 03/07/18 notation by Levander Campion, RN:  "Non-magnet EGM rhythm is Ap/Vs @ 71bpm. 13 mode switches (<0.1%)--1 AHR episode. EGM from 02/13/18 AHR suggests brief AT vs PACs (markers only). VHR episodes suggest NSVT per markers. Device function normal, battery and lead trends stable. Histograms appropriate. Patient made aware of normal transmission."  Based on transmission record, Dr. Lorenza Cambridge wrote, "[name], I would rely more on the PM report than on the ECG narrative without being able to actually see the tracing from [place].  As I said, I have not seen [name] have any AFib in several years." He had also previously shared with me that even if patient was having some episodes of PAF then given recent GI bleed it would likely be too dangerous to consider anticoagulation anyway.   Based on available information then I would anticipate that he can proceed as planned. I did not enter an order for repeat EKG on the day of surgery given input from Dr. Sallyanne Kuster based off of patient's recent PPM transmission. Assigned anesthesiologist will need to notify staff if he/she prefers new EKG.   VS: BP 137/65   Pulse 81   Temp 36.5 C   Ht 5\' 10"  (1.778 m)   Wt 94.6 kg   SpO2 99%   BMI 29.92 kg/m    PROVIDERS: [name], MD is PCP at [place] (see Care Everywhere).  Sallyanne Kuster, Mihai, MD is primary cardiologist, last visit 12/06/17. Patient had  described symptoms concerning for exertional angina despite high dose anti-anginal medications. However, symptoms not always predictably and he had no faith nuclear stress test. He considered proceeding with cardiac cath depending on finding of lumbar myelogram and surgical plans. Ultimately a decision was made not to proceed with cardiac cath. In regards to PPM, it was functioning normally at that visit. He wrote, "He is 'atrially dependent' without any detectable atrial activity and with 98% atrial pacing. He has normal AV conduction and has only about 2.2% ventricular pacing. Heart rate histograms are favorable.[name] KitchenMarland KitchenOver the last many years he has had brief episodes of nonsustained ventricular tachycardia that occur about once every 1 or 2 months. Previous workup for ischemia in response to the VT did not lead to any abnormalities. His device has not recorded any atrial fibrillation."   - Baird Lyons, MD is pulmonologist. He saw patient on 02/27/08 for preoperative evaluation and OSA follow-up.    LABS: Preoperative labs noted. Cr 1.52 and appears stable. H/H 9.8/33.1.  (all labs ordered are listed, but only abnormal results are displayed)  Labs Reviewed  BASIC METABOLIC PANEL - Abnormal; Notable for the  following components:      Result Value   CO2 16 (*)    Glucose, Bld 119 (*)    BUN 34 (*)    Creatinine, Ser 1.52 (*)    GFR calc non Af Amer 44 (*)    GFR calc Af Amer 51 (*)    All other components within normal limits  CBC WITH DIFFERENTIAL/PLATELET - Abnormal; Notable for the following components:   WBC 11.1 (*)    RBC 3.61 (*)    Hemoglobin 9.8 (*)    HCT 33.1 (*)    MCHC 29.6 (*)    Neutro Abs 8.4 (*)    All other components within normal limits  SURGICAL PCR SCREEN  PROTIME-INR   Lab Results  Component Value Date   CREATININE 1.52 (H) 03/05/2018   CREATININE 1.42 (H) 12/05/2017   CREATININE 1.54 (H) 02/12/2017    OTHER: Colonoscopy 02/18/18 ([place]): Findings: - Normal-appearing terminal ileum with yellow bile present. - A 5 mm sessile polyp and a 3 mm sessile polyp present in the cecum.  Removed with cold snare. - Otherwise colonic mucosa appeared grossly normal throughout without AVMs  or further polyps. - Internal hemorrhoids present on retroflexion of the rectum.  Small Bowel Enteroscopy 02/16/18 ([place]): Recommendations: PPI twice daily. Will discuss need for colonoscopy with patient looking colonic source of  anemia/melena. Findings: - Esophagus appeared grossly normal. - Stomach appeared grossly normal. - Small superficial ulcer present in the duodenum without active bleeding.  This may be related to recent cautery treatment for AVM. - The small bowel was examined to the proximal jejunum. No evidence of AVMs  or further ulcers. No blood present. Clear yellow bile present.  EGD 02/05/18 ([place]): Recommendations: Hold ASA for 1 week Continue PPI Monitor H/H and transfuse as needed Diet as tolerated Oral iron supplementation Findings: - Single angioectasia in the jejunum with bleeding; induced coagulation with  bipolar cautery; placed 2 clips successfully - Single small angioectasia in the 2nd part of the duodenum with stigmata;  induced coagulation with bipolar cautery - Single angioectasia in the 4th part of the duodenum with stigmata; induced  coagulation with bipolar cautery - The esophagus appeared normal. - The stomach appeared normal. - Nodular mucosa in the duodenal bulb. Benign gastric metaplasia  Spirometry 02/26/18: Mild Restriction of exhaled volume.  FVC 2.9/69%, FEV1 2.4/81%, ratio 1.84, FEF 25-75% 3.1/145%.   IMAGES: CXR 03/05/18: IMPRESSION: Cardiomegaly.  No active disease.  CT L-spine 12/13/17: IMPRESSION: 1. Progressive degenerative disc disease at L4-L5 with new right paracentral disc extrusion resulting in moderate central spinal canal  stenosis, and severe right and moderate left lateral recess stenosis. Unchanged severe right and moderate left neuroforaminal stenosis. 2. Interval L2-L3 fusion without residual stenosis. 3. Unchanged moderate to severe right neuroforaminal stenosis at L5-S1. 4. Unchanged moderate right neuroforaminal stenosis at L3-L4.   EKG:  - EKG 02/14/18 ([facility]): Result Narrative: Tracing Requested Atrial fibrillation Right bundle branch block Left anterior fascicular block Bifascicular block Abnormal ECG When compared with ECG of 04-Jul-2013 17:19, Atrial fibrillation has replaced Sinus rhythm (RBBB and left anterior fascicular block) is now present  - EKG 11/05/17: Wide QRS rhythm. RBBB.  - Remote pacemaker transmission 01/09/18. Normal remote reviewed. 5 suggested AF events <8minute duration no egms for review. NO recent HVR events. Next remote 3.4.2020.   CV: Echo 02/18/18 ([place]; ordered for pedal edema): Interpretation Summary A complete two-dimensional transthoracic echocardiogram with color flow Doppler  and spectral Doppler was performed. The study was technically adequate. The left ventricle is normal in size. There is normal left ventricular wall thickness. There is no thrombus. The left ventricular ejection fraction is normal (55-60%). The right ventricular ejection fraction is normal. There is mild (1+) mitral regurgitation. There is moderate (2+) tricuspid regurgitation. Right ventricular systolic pressure is elevated between 40-51mm Hg, consistent with moderate pulmonary hypertension. The aortic valve is trileaflet. Unable to adequately determine diastolic dysfunction. The left atrium is mildly dilated.  LLE venous US 02/16/18 ([place]): IMPRESSION: No evidence of deep venous thrombosis in the left lower extremity.  Cardiac cath 12/21/10: Impression: 1. Widely patent LIMA-D1-LAD with excellent distal LAD and diagonal flow.  2. Widely  patent SVG-RPDA with retrograde filling to the RPL system and native RCA demonstrating diffuse in-stent stenosis of RCA stent.  3. Known subtotal occlusion of RPDA beyond SVG.  4. Native circumflex was large OM 2 small moderate OM1 and remaining vessel moderate disease, most significant being ostial OM1 60% in Xarelto <2 mm vessel. This appeared angiographically similar to prior angiography in 2011.  5. Left Ventriculography: EF 50-55%, no obvious wall motion abnormalities.  Carotid U/S 01/19/16: Impression:  This study is negative for hemodynamically significant stenosis involving extracranial carotid and vertebral arteries bilaterally.   Past Medical History:  Diagnosis Date  . A-fib ([place])   . Anticoagulant long-term use, on coumadin for PAF 03/08/2012  . Chronic back pain   . Complete heart block ([place])   . Coronary artery disease    seen at [facility] cardiology   . Diabetes mellitus   . History of kidney stones   . Hx of echocardiogram 06/29/2008   EF 45-50%   . Hypertension    Dr. Veronia Beets, primary  . Neuromuscular disorder ([place])    carpal tunnel bilaterally  . 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 Polio    hx of  . Sleep apnea    cpap, 12, last sleep study Jan2013, sees Dr. Keturah Barre  . Status post placement of cardiac pacemaker, 03/07/12 New Medtronic Gen and new Vent lead original placed 2008 03/08/2012    Past Surgical History:  Procedure Laterality Date  . APPENDECTOMY    . BACK SURGERY    . BILATERAL CARPAL TUNNEL RELEASE    . CARDIAC CATHETERIZATION    . CORONARY ARTERY BYPASS GRAFT  1990  . CORONARY ARTERY BYPASS GRAFT  2004  . ESOPHAGOGASTRODUODENOSCOPY Left 03/18/2013   Procedure: ESOPHAGOGASTRODUODENOSCOPY (EGD);  Surgeon: Cleotis Nipper, MD;  Location: [facility] ENDOSCOPY;  Service: Endoscopy;  Laterality: Left;  . EYE SURGERY Bilateral    cataract surgery with lens implants  . FRACTURE SURGERY     Hx: of right leg  . HARDWARE REMOVAL N/A 05/18/2016    Procedure: Removal of broken hardware Lumbar three-four;  Surgeon: [name], MD;  Location: [place];  Service: Neurosurgery;  Laterality: N/A;  . HERNIA REPAIR    . INSERT / REPLACE / REMOVE PACEMAKER  03/07/2012   replaced  . JOINT REPLACEMENT     Hx: of left knee  . LEFT HEART CATHETERIZATION WITH CORONARY/GRAFT ANGIOGRAM N/A 12/21/2010   Procedure: LEFT HEART CATHETERIZATION WITH Beatrix Fetters;  Surgeon: Leonie Man, MD;  Location: [facility] CATH LAB;  Service: Cardiovascular;  Laterality: N/A;  . LEG SURGERY Right    lengthened his leg  . LUMBAR LAMINECTOMY/DECOMPRESSION MICRODISCECTOMY  01/10/2012   Procedure: LUMBAR LAMINECTOMY/DECOMPRESSION MICRODISCECTOMY 1 LEVEL;  Surgeon: [name], MD;  Location: [facility] NEURO ORS;  Service: Neurosurgery;  Laterality: Bilateral;  Lumbar three-four decompression, Posterior lateral fusion lumbar three-four, posterior spinus plate lumbar three-four  . LUMBAR LAMINECTOMY/DECOMPRESSION MICRODISCECTOMY Left 05/18/2016   Procedure: Laminectomy and Foraminotomy - Lumbar two-lumbar three- Lumbar four-lumbar five- left with removal hardware lumbar three-four;  Surgeon: [name], MD;  Location: [facility];  Service: Neurosurgery;  Laterality: Left;  [name] Kitchen MAZE Procedure  2004  . NASAL SEPTUM SURGERY N/A 14  . PACEMAKER INSERTION  08, 1/14  . PACEMAKER REVISION N/A 03/07/2012   Procedure: PACEMAKER REVISION;  Surgeon: Sanda Klein, MD;  Location: [place] CATH LAB;  Service: Cardiovascular;  Laterality: N/A;  . POSTERIOR FUSION LUMBAR SPINE  08/28/2012   Dr Ronnald Ramp  . SHOULDER ARTHROSCOPY WITH OPEN ROTATOR CUFF REPAIR Right 03/30/2014   Procedure: RIGHT SHOULDER ARTHROSCOPY  OPEN ROTATOR CUFF REPAIR;  Surgeon: Kerin Salen, MD;  Location: [place];  Service: Orthopedics;  Laterality: Right;  . SHOULDER OPEN ROTATOR CUFF REPAIR Right 03/30/2014   Procedure: ROTATOR CUFF REPAIR SHOULDER OPEN;  Surgeon: Kerin Salen, MD;  Location: [place];  Service: Orthopedics;   Laterality: Right;  . STENTS    . TONSILLECTOMY    left TKA 94/76/54, umbilical hernia repair 65/0/35, right rotator cuff repair 03/30/14, back surgeries (L3-4 laminectomy 01/10/12; L3-4 PLIF 07/2012; removal L3-4 posterior instrumentation and decompressive hemilaminectomy/foraminotomoy L2-3/L4-5 05/18/16; L2-3 PLIF 02/15/17).   MEDICATIONS: . amLODipine (NORVASC) 10 MG tablet  . carboxymethylcellulose (REFRESH PLUS) 0.5 % SOLN  . carvedilol (COREG) 25 MG tablet  . colchicine 0.6 MG tablet  . diclofenac sodium (VOLTAREN) 1 % GEL  . ferrous sulfate 325 (65 FE) MG tablet  . fluticasone (FLONASE) 50 MCG/ACT nasal spray  . glipiZIDE (GLUCOTROL) 10 MG tablet  . hydrochlorothiazide (HYDRODIURIL) 25 MG tablet  . Lancets (FREESTYLE) lancets  . losartan (COZAAR) 100 MG tablet  . metFORMIN (GLUCOPHAGE) 500 MG tablet  . pantoprazole (PROTONIX) 40 MG tablet   . Chlorhexidine Gluconate Cloth 2 % PADS 6 each   And  . Chlorhexidine Gluconate Cloth 2 % PADS 6 each    Myra Gianotti, PA-C Surgical Short Stay/Anesthesiology [facility] Phone [phone] [facility] Phone [phone] 03/06/2018 11:20 PM

## 2018-03-07 ENCOUNTER — Telehealth: Payer: Self-pay | Admitting: *Deleted

## 2018-03-07 NOTE — Telephone Encounter (Signed)
Spoke with patient to request [facility] transmission for review. Offered to assist patient with transmission but he declines, states he will transmit in a few minutes.

## 2018-03-07 NOTE — Telephone Encounter (Signed)
Excellent, thanks Alan [name]!  [name], I would rely more on the PM report than on the ECG narrative without being able to actually see the tracing from [name].  As I said, I have not seen [name] have any AFib in several years.  [facility]

## 2018-03-07 NOTE — Telephone Encounter (Signed)
----- Message from Sanda Klein, MD sent at 03/07/2018  4:15 PM EST ----- Regarding: RE: EKG - OR 2/3 Alan [name] has not had AFib in years. None was recorded on his last PM from last month on Dec 04. Having said that, you can count on atrial fibrillation to eventually come back, especially during acute illness. I will ask the device clinic to get another download from him. That's easier on him than bringing him back in. It will reliably tell us if he had atrial fibrillation and how long it lasted. Having said that, at this point it sounds that anticoagulation is too dangerous anyway. What type of surgery is planned?  Raquel Sarna, I know you are swamped, but could you please get another download from Alan [name]? Thank you! MCr ----- Message ----- From: Jacinta Shoe, PA-C Sent: 03/06/2018  11:22 PM EST To: Sanda Klein, MD Subject: EKG - OR 2/3                                   Hi Dr. Sallyanne Kuster, As you are aware, Alan [name] is scheduled for L2-3 lumbar fusion with Dr. Ronnald Ramp on 03/11/18. You spoke with Alan [name] and provided preoperative cardiology input on 02/14/18.  Since then Alan [name] was admitted to [facility] for re-evaluation of GI bleed. In summary, he was initially admitted 02/04/18 and underwent transfusion and EGD showing bleeding angioectasia treated with cautery and endoclips. He was re-admitted 02/15/18-02/20/18 and required additional transfusion but small bowel enteroscopy and colonoscopy showed no active bleeding. He also had peripheral edema with negative LLE venous US for DVT and was treated with diuresis and colchicine. Echo showed normal LVEF, moderate TR, moderate pulmonary hypertension. By result narrative his EKG showed afib (previously had underlying sinus rhythm by notes, although 01/09/18 PPM interrogation indicated 5 AF events < 1 min.)  He had a nurse pre-op visit yesterday, but his EKG was not repeated. Would it be expected that he might be back in (or go  in and out of) afib? I wasn't sure whether to try to get him to come back in to the clinic to repeat an EKG or if his device should be re-interrogated. Please advise. (We are still sorting through some of his other issues that could influence surgery plans/timing given he is still anemic [HGB 9.8] but improving and also treated for URI recently.) Below are summaries of his EKG and echo from [place].   - EKG 02/14/18 ([facility]): Result Narrative: Atrial fibrillation Right bundle branch block Left anterior fascicular block Bifascicular block Abnormal ECG When compared with ECG of 04-Jul-2013 17:19, Atrial fibrillation has replaced Sinus rhythm (RBBB and left anterior fascicular block) is now present  Echo 02/18/18 ([place]; ordered for pedal edema): Interpretation Summary A complete two-dimensional transthoracic echocardiogram with color flow Doppler and spectral Doppler was performed. The study was technically adequate. The left ventricle is normal in size. There is normal left ventricular wall thickness. There is no thrombus. The left ventricular ejection fraction is normal (55-60%). The right ventricular ejection fraction is normal. There is mild (1+) mitral regurgitation. There is moderate (2+) tricuspid regurgitation. Right ventricular systolic pressure is elevated between 40-70mm Hg, consistent with moderate pulmonary hypertension. The aortic valve is trileaflet. Unable to adequately determine diastolic dysfunction. The left atrium is mildly dilated.  Thanks, Myra Gianotti, PA-C Surgical Short Stay/Anesthesiology [facility] Phone [phone] [facility] Phone [phone] 03/06/2018 11:36  PM

## 2018-03-07 NOTE — Telephone Encounter (Signed)
Transmission received and reviewed. Non-magnet EGM rhythm is Ap/Vs @ 71bpm. 13 mode switches (<0.1%)--1 AHR episode. EGM from 02/13/18 AHR suggests brief AT vs PACs (markers only). VHR episodes suggest NSVT per markers. Device function normal, battery and lead trends stable. Histograms appropriate.  Patient made aware of normal transmission. He denies questions and thanked me for my call.  Routed to Dr. Sallyanne Kuster and Myra Gianotti, PA, for review.

## 2018-03-08 NOTE — Anesthesia Preprocedure Evaluation (Addendum)
Anesthesia Evaluation  Patient identified by MRN, date of birth, ID band Patient awake    Reviewed: Allergy & Precautions, NPO status , Patient's Chart, lab work & pertinent test results, reviewed documented beta blocker date and time   History of Anesthesia Complications Negative for: history of anesthetic complications  Airway Mallampati: II  TM Distance: >3 FB Neck ROM: Full    Dental  (+) Teeth Intact, Dental Advisory Given, Caps   Pulmonary sleep apnea ,    breath sounds clear to auscultation       Cardiovascular hypertension, Pt. on medications and Pt. on home beta blockers + angina + CAD and + CABG  + dysrhythmias + pacemaker  Rhythm:Regular     Neuro/Psych PSYCHIATRIC DISORDERS  Neuromuscular disease    GI/Hepatic negative GI ROS, Neg liver ROS,   Endo/Other  diabetes, Type 2, Oral Hypoglycemic Agents  Renal/GU CRFRenal disease     Musculoskeletal  (+) Arthritis ,   Abdominal   Peds  Hematology  (+) anemia ,   Anesthesia Other Findings 2017 tte:  - Left ventricle: The cavity size was normal. Wall thickness was   normal. Systolic function was normal. The estimated ejection   fraction was in the range of 55% to 60%. Wall motion was normal;   there were no regional wall motion abnormalities. Features are   consistent with a pseudonormal left ventricular filling pattern,   with concomitant abnormal relaxation and increased filling   pressure (grade 2 diastolic dysfunction). - Mitral valve: Calcified annulus. There was trivial regurgitation. - Left atrium: The atrium was moderately dilated. Volume, ES,   (1-plane Simpson&'s, A2C): 45.2 ml. - Right ventricle: The cavity size was mildly dilated. Wall   thickness was normal. Pacer wire or catheter noted in right   ventricle. - Right atrium: The atrium was mildly dilated. Pacer wire or   catheter noted in right atrium.  Reproductive/Obstetrics                                                             Anesthesia Evaluation  Patient identified by MRN, date of birth, ID band Patient awake    Reviewed: Allergy & Precautions, NPO status , Patient's Chart, lab work & pertinent test results, reviewed documented beta blocker date and time   History of Anesthesia Complications Negative for: history of anesthetic complications  Airway Mallampati: II  TM Distance: >3 FB Neck ROM: Full    Dental  (+) Caps, Dental Advisory Given   Pulmonary sleep apnea and Continuous Positive Airway Pressure Ventilation ,    breath sounds clear to auscultation       Cardiovascular hypertension, Pt. on medications and Pt. on home beta blockers (-) angina+ CAD, + Cardiac Stents and + CABG  + dysrhythmias (s/p Maze) Atrial Fibrillation and Ventricular Tachycardia + pacemaker (dual chamber Medtronic for CHB)  Rhythm:Regular Rate:Normal  '17 ECHO: EF 55-60%, valves OK   Neuro/Psych negative neurological ROS  negative psych ROS   GI/Hepatic negative GI ROS, Neg liver ROS,   Endo/Other  diabetes (glu 90), Oral Hypoglycemic AgentsMorbid obesity  Renal/GU Renal InsufficiencyRenal disease (creat 1.54)     Musculoskeletal  (+) Arthritis , Osteoarthritis,    Abdominal (+) + obese,   Peds  Hematology negative hematology ROS (+)   Anesthesia Other  Findings   Reproductive/Obstetrics                             Anesthesia Physical Anesthesia Plan  ASA: III  Anesthesia Plan: General   Post-op Pain Management:    Induction: Intravenous  PONV Risk Score and Plan: 3 and Ondansetron and Dexamethasone  Airway Management Planned: Oral ETT  Additional Equipment:   Intra-op Plan:   Post-operative Plan: Extubation in OR  Informed Consent: I have reviewed the patients History and Physical, chart, labs and discussed the procedure including the risks, benefits and alternatives for the  proposed anesthesia with the patient or authorized representative who has indicated his/her understanding and acceptance.   Dental advisory given  Plan Discussed with: CRNA and Surgeon  Anesthesia Plan Comments: (Plan routine monitors, GETA)        Anesthesia Quick Evaluation  Anesthesia Physical Anesthesia Plan  ASA: III  Anesthesia Plan: General   Post-op Pain Management:    Induction: Intravenous  PONV Risk Score and Plan: 2 and Ondansetron and Dexamethasone  Airway Management Planned: Oral ETT  Additional Equipment: None  Intra-op Plan:   Post-operative Plan: Extubation in OR  Informed Consent: I have reviewed the patients History and Physical, chart, labs and discussed the procedure including the risks, benefits and alternatives for the proposed anesthesia with the patient or authorized representative who has indicated his/her understanding and acceptance.     Dental advisory given  Plan Discussed with: CRNA and Surgeon  Anesthesia Plan Comments: (See PAT note written by Myra Gianotti, PA-C. Pacemaker set to A00)       Anesthesia Quick Evaluation

## 2018-03-11 ENCOUNTER — Inpatient Hospital Stay (HOSPITAL_COMMUNITY): Payer: Medicare HMO | Admitting: Physician Assistant

## 2018-03-11 ENCOUNTER — Encounter (HOSPITAL_COMMUNITY): Payer: Self-pay | Admitting: Anesthesiology

## 2018-03-11 ENCOUNTER — Inpatient Hospital Stay (HOSPITAL_COMMUNITY)
Admission: RE | Admit: 2018-03-11 | Discharge: 2018-03-12 | DRG: 455 | Disposition: A | Discharge status: Home Health | Payer: Medicare HMO | Attending: Neurological Surgery | Admitting: Neurological Surgery

## 2018-03-11 ENCOUNTER — Inpatient Hospital Stay (HOSPITAL_COMMUNITY): Payer: Medicare HMO | Admitting: Vascular Surgery

## 2018-03-11 ENCOUNTER — Other Ambulatory Visit: Payer: Self-pay

## 2018-03-11 ENCOUNTER — Inpatient Hospital Stay (HOSPITAL_COMMUNITY): Payer: Medicare HMO

## 2018-03-11 ENCOUNTER — Encounter (HOSPITAL_COMMUNITY)
Admission: RE | Disposition: A | Discharge status: Home Health | Payer: Self-pay | Source: Home / Self Care | Attending: Neurological Surgery

## 2018-03-11 DIAGNOSIS — M48061 Spinal stenosis, lumbar region without neurogenic claudication: Secondary | ICD-10-CM | POA: Diagnosis present

## 2018-03-11 DIAGNOSIS — G8929 Other chronic pain: Secondary | ICD-10-CM | POA: Diagnosis present

## 2018-03-11 DIAGNOSIS — I2510 Atherosclerotic heart disease of native coronary artery without angina pectoris: Secondary | ICD-10-CM | POA: Diagnosis present

## 2018-03-11 DIAGNOSIS — Z950 Presence of cardiac pacemaker: Secondary | ICD-10-CM | POA: Diagnosis not present

## 2018-03-11 DIAGNOSIS — Z79899 Other long term (current) drug therapy: Secondary | ICD-10-CM

## 2018-03-11 DIAGNOSIS — Z7984 Long term (current) use of oral hypoglycemic drugs: Secondary | ICD-10-CM | POA: Diagnosis not present

## 2018-03-11 DIAGNOSIS — I480 Paroxysmal atrial fibrillation: Secondary | ICD-10-CM | POA: Diagnosis present

## 2018-03-11 DIAGNOSIS — Z7901 Long term (current) use of anticoagulants: Secondary | ICD-10-CM | POA: Diagnosis not present

## 2018-03-11 DIAGNOSIS — Z87442 Personal history of urinary calculi: Secondary | ICD-10-CM | POA: Diagnosis not present

## 2018-03-11 DIAGNOSIS — Z951 Presence of aortocoronary bypass graft: Secondary | ICD-10-CM | POA: Diagnosis not present

## 2018-03-11 DIAGNOSIS — Z981 Arthrodesis status: Secondary | ICD-10-CM | POA: Diagnosis not present

## 2018-03-11 DIAGNOSIS — I10 Essential (primary) hypertension: Secondary | ICD-10-CM | POA: Diagnosis present

## 2018-03-11 DIAGNOSIS — E119 Type 2 diabetes mellitus without complications: Secondary | ICD-10-CM | POA: Diagnosis present

## 2018-03-11 DIAGNOSIS — Z419 Encounter for procedure for purposes other than remedying health state, unspecified: Secondary | ICD-10-CM

## 2018-03-11 DIAGNOSIS — G4730 Sleep apnea, unspecified: Secondary | ICD-10-CM | POA: Diagnosis present

## 2018-03-11 LAB — GLUCOSE, CAPILLARY
Glucose-Capillary: 146 mg/dL — ABNORMAL HIGH (ref 70–99)
Glucose-Capillary: 182 mg/dL — ABNORMAL HIGH (ref 70–99)
Glucose-Capillary: 229 mg/dL — ABNORMAL HIGH (ref 70–99)
Glucose-Capillary: 271 mg/dL — ABNORMAL HIGH (ref 70–99)

## 2018-03-11 LAB — PREPARE RBC (CROSSMATCH)

## 2018-03-11 SURGERY — POSTERIOR LUMBAR FUSION 1 LEVEL
Anesthesia: General | Site: Back

## 2018-03-11 MED ORDER — INSULIN ASPART 100 UNIT/ML ~~LOC~~ SOLN
0.0000 [IU] | Freq: Three times a day (TID) | SUBCUTANEOUS | Status: DC
  Administered 2018-03-12: 8 [IU] via SUBCUTANEOUS
  Administered 2018-03-12: 2 [IU] via SUBCUTANEOUS

## 2018-03-11 MED ORDER — ACETAMINOPHEN 10 MG/ML IV SOLN
1000.0000 mg | Freq: Once | INTRAVENOUS | Status: AC
  Administered 2018-03-11: 1000 mg via INTRAVENOUS
  Filled 2018-03-11: qty 100

## 2018-03-11 MED ORDER — SODIUM CHLORIDE 0.9 % IV SOLN
INTRAVENOUS | Status: DC
  Administered 2018-03-11: 10:00:00 via INTRAVENOUS

## 2018-03-11 MED ORDER — LIDOCAINE 2% (20 MG/ML) 5 ML SYRINGE
INTRAMUSCULAR | Status: DC | PRN
  Administered 2018-03-11: 60 mg via INTRAVENOUS

## 2018-03-11 MED ORDER — PHENYLEPHRINE 40 MCG/ML (10ML) SYRINGE FOR IV PUSH (FOR BLOOD PRESSURE SUPPORT)
PREFILLED_SYRINGE | INTRAVENOUS | Status: DC | PRN
  Administered 2018-03-11 (×4): 80 ug via INTRAVENOUS

## 2018-03-11 MED ORDER — ACETAMINOPHEN 650 MG RE SUPP: 650.0000 mg | RECTAL | Status: DC | PRN

## 2018-03-11 MED ORDER — INSULIN ASPART 100 UNIT/ML ~~LOC~~ SOLN
0.0000 [IU] | Freq: Every day | SUBCUTANEOUS | Status: DC
  Administered 2018-03-11: 3 [IU] via SUBCUTANEOUS

## 2018-03-11 MED ORDER — SENNA 8.6 MG PO TABS
1.0000 | ORAL_TABLET | Freq: Two times a day (BID) | ORAL | Status: DC
  Administered 2018-03-11 – 2018-03-12 (×2): 8.6 mg via ORAL
  Filled 2018-03-11 (×2): qty 1

## 2018-03-11 MED ORDER — SODIUM CHLORIDE 0.9 % IV SOLN
INTRAVENOUS | Status: DC | PRN
  Administered 2018-03-11: 11:00:00

## 2018-03-11 MED ORDER — PHENOL 1.4 % MT LIQD: 1.0000 | OROMUCOSAL | Status: DC | PRN

## 2018-03-11 MED ORDER — MIDAZOLAM HCL 2 MG/2ML IJ SOLN
INTRAMUSCULAR | Status: AC
  Filled 2018-03-11: qty 2

## 2018-03-11 MED ORDER — METHOCARBAMOL 1000 MG/10ML IJ SOLN
500.0000 mg | Freq: Four times a day (QID) | INTRAVENOUS | Status: DC | PRN
  Filled 2018-03-11: qty 5

## 2018-03-11 MED ORDER — FENTANYL CITRATE (PF) 250 MCG/5ML IJ SOLN
INTRAMUSCULAR | Status: AC
  Filled 2018-03-11: qty 5

## 2018-03-11 MED ORDER — INSULIN ASPART 100 UNIT/ML ~~LOC~~ SOLN
0.0000 [IU] | Freq: Three times a day (TID) | SUBCUTANEOUS | Status: DC
  Administered 2018-03-11: 5 [IU] via SUBCUTANEOUS

## 2018-03-11 MED ORDER — VANCOMYCIN HCL 1000 MG IV SOLR
INTRAVENOUS | Status: DC | PRN
  Administered 2018-03-11: 1000 mg

## 2018-03-11 MED ORDER — ROCURONIUM BROMIDE 50 MG/5ML IV SOSY
PREFILLED_SYRINGE | INTRAVENOUS | Status: AC
  Filled 2018-03-11: qty 5

## 2018-03-11 MED ORDER — PHENYLEPHRINE 40 MCG/ML (10ML) SYRINGE FOR IV PUSH (FOR BLOOD PRESSURE SUPPORT)
PREFILLED_SYRINGE | INTRAVENOUS | Status: AC
  Filled 2018-03-11: qty 10

## 2018-03-11 MED ORDER — HEPARIN SODIUM (PORCINE) 1000 UNIT/ML IJ SOLN
INTRAMUSCULAR | Status: AC
  Filled 2018-03-11: qty 1

## 2018-03-11 MED ORDER — MENTHOL 3 MG MT LOZG: 1.0000 | LOZENGE | OROMUCOSAL | Status: DC | PRN

## 2018-03-11 MED ORDER — SODIUM CHLORIDE 0.9 % IV SOLN: 250.0000 mL | INTRAVENOUS | Status: DC

## 2018-03-11 MED ORDER — CARVEDILOL 25 MG PO TABS
50.0000 mg | ORAL_TABLET | Freq: Two times a day (BID) | ORAL | Status: DC
  Administered 2018-03-11 – 2018-03-12 (×2): 50 mg via ORAL
  Filled 2018-03-11 (×2): qty 2

## 2018-03-11 MED ORDER — BUPIVACAINE HCL (PF) 0.25 % IJ SOLN
INTRAMUSCULAR | Status: AC
  Filled 2018-03-11: qty 30

## 2018-03-11 MED ORDER — DEXAMETHASONE SODIUM PHOSPHATE 10 MG/ML IJ SOLN
10.0000 mg | INTRAMUSCULAR | Status: AC
  Administered 2018-03-11: 10 mg via INTRAVENOUS
  Filled 2018-03-11: qty 1

## 2018-03-11 MED ORDER — THROMBIN 5000 UNITS EX SOLR
OROMUCOSAL | Status: DC | PRN
  Administered 2018-03-11: 12:00:00 via TOPICAL

## 2018-03-11 MED ORDER — HYDROMORPHONE HCL 1 MG/ML IJ SOLN: 0.5000 mg | INTRAMUSCULAR | Status: DC | PRN

## 2018-03-11 MED ORDER — FERROUS SULFATE 325 (65 FE) MG PO TABS
325.0000 mg | ORAL_TABLET | Freq: Every day | ORAL | Status: DC
  Administered 2018-03-12: 325 mg via ORAL
  Filled 2018-03-11: qty 1

## 2018-03-11 MED ORDER — LACTATED RINGERS IV SOLN
INTRAVENOUS | Status: DC | PRN
  Administered 2018-03-11: 12:00:00 via INTRAVENOUS

## 2018-03-11 MED ORDER — OXYCODONE HCL 5 MG PO TABS
5.0000 mg | ORAL_TABLET | ORAL | Status: DC | PRN
  Administered 2018-03-11 – 2018-03-12 (×5): 5 mg via ORAL
  Filled 2018-03-11 (×5): qty 1

## 2018-03-11 MED ORDER — PROPOFOL 10 MG/ML IV BOLUS
INTRAVENOUS | Status: AC
  Filled 2018-03-11: qty 20

## 2018-03-11 MED ORDER — ONDANSETRON HCL 4 MG/2ML IJ SOLN
INTRAMUSCULAR | Status: AC
  Filled 2018-03-11: qty 2

## 2018-03-11 MED ORDER — 0.9 % SODIUM CHLORIDE (POUR BTL) OPTIME
TOPICAL | Status: DC | PRN
  Administered 2018-03-11: 1000 mL

## 2018-03-11 MED ORDER — LIDOCAINE 2% (20 MG/ML) 5 ML SYRINGE
INTRAMUSCULAR | Status: AC
  Filled 2018-03-11: qty 5

## 2018-03-11 MED ORDER — CEFAZOLIN SODIUM-DEXTROSE 2-4 GM/100ML-% IV SOLN
2.0000 g | Freq: Three times a day (TID) | INTRAVENOUS | Status: AC
  Administered 2018-03-11 – 2018-03-12 (×2): 2 g via INTRAVENOUS
  Filled 2018-03-11 (×2): qty 100

## 2018-03-11 MED ORDER — SODIUM CHLORIDE 0.9% FLUSH: 3.0000 mL | INTRAVENOUS | Status: DC | PRN

## 2018-03-11 MED ORDER — THROMBIN 20000 UNITS EX SOLR
CUTANEOUS | Status: AC
  Filled 2018-03-11: qty 20000

## 2018-03-11 MED ORDER — BUPIVACAINE HCL (PF) 0.25 % IJ SOLN
INTRAMUSCULAR | Status: DC | PRN
  Administered 2018-03-11: 7 mL

## 2018-03-11 MED ORDER — VANCOMYCIN HCL 1000 MG IV SOLR
INTRAVENOUS | Status: AC
  Filled 2018-03-11: qty 1000

## 2018-03-11 MED ORDER — ONDANSETRON HCL 4 MG/2ML IJ SOLN: 4.0000 mg | Freq: Four times a day (QID) | INTRAMUSCULAR | Status: DC | PRN

## 2018-03-11 MED ORDER — SODIUM CHLORIDE 0.9% FLUSH
3.0000 mL | Freq: Two times a day (BID) | INTRAVENOUS | Status: DC
  Administered 2018-03-11: 3 mL via INTRAVENOUS

## 2018-03-11 MED ORDER — ACD FORMULA A 0.73-2.45-2.2 GM/100ML VI SOLN
Status: AC | PRN
  Administered 2018-03-11: 500 mL via INTRAVENOUS

## 2018-03-11 MED ORDER — CEFAZOLIN SODIUM-DEXTROSE 2-4 GM/100ML-% IV SOLN
2.0000 g | INTRAVENOUS | Status: AC
  Administered 2018-03-11: 2 g via INTRAVENOUS
  Filled 2018-03-11: qty 100

## 2018-03-11 MED ORDER — SODIUM CHLORIDE 0.9 % IV SOLN
INTRAVENOUS | Status: DC | PRN
  Administered 2018-03-11: 25 ug/min via INTRAVENOUS

## 2018-03-11 MED ORDER — SUGAMMADEX SODIUM 200 MG/2ML IV SOLN
INTRAVENOUS | Status: DC | PRN
  Administered 2018-03-11: 200 mg via INTRAVENOUS

## 2018-03-11 MED ORDER — FENTANYL CITRATE (PF) 100 MCG/2ML IJ SOLN
INTRAMUSCULAR | Status: DC | PRN
  Administered 2018-03-11 (×2): 50 ug via INTRAVENOUS
  Administered 2018-03-11: 150 ug via INTRAVENOUS
  Administered 2018-03-11 (×2): 50 ug via INTRAVENOUS

## 2018-03-11 MED ORDER — THROMBIN 5000 UNITS EX SOLR
CUTANEOUS | Status: AC
  Filled 2018-03-11: qty 5000

## 2018-03-11 MED ORDER — THROMBIN 20000 UNITS EX SOLR
CUTANEOUS | Status: DC | PRN
  Administered 2018-03-11: 12:00:00 via TOPICAL

## 2018-03-11 MED ORDER — METFORMIN HCL 500 MG PO TABS
500.0000 mg | ORAL_TABLET | Freq: Three times a day (TID) | ORAL | Status: DC
  Administered 2018-03-11 – 2018-03-12 (×4): 500 mg via ORAL
  Filled 2018-03-11 (×4): qty 1

## 2018-03-11 MED ORDER — COLCHICINE 0.6 MG PO TABS
0.6000 mg | ORAL_TABLET | Freq: Two times a day (BID) | ORAL | Status: DC
  Administered 2018-03-11 – 2018-03-12 (×2): 0.6 mg via ORAL
  Filled 2018-03-11 (×2): qty 1

## 2018-03-11 MED ORDER — PANTOPRAZOLE SODIUM 40 MG PO TBEC
40.0000 mg | DELAYED_RELEASE_TABLET | Freq: Two times a day (BID) | ORAL | Status: DC
  Administered 2018-03-11 – 2018-03-12 (×2): 40 mg via ORAL
  Filled 2018-03-11 (×2): qty 1

## 2018-03-11 MED ORDER — ACETAMINOPHEN 325 MG PO TABS
650.0000 mg | ORAL_TABLET | ORAL | Status: DC | PRN
  Administered 2018-03-12: 650 mg via ORAL
  Filled 2018-03-11: qty 2

## 2018-03-11 MED ORDER — METHOCARBAMOL 500 MG PO TABS
500.0000 mg | ORAL_TABLET | Freq: Four times a day (QID) | ORAL | Status: DC | PRN
  Administered 2018-03-11 – 2018-03-12 (×3): 500 mg via ORAL
  Filled 2018-03-11 (×3): qty 1

## 2018-03-11 MED ORDER — ONDANSETRON HCL 4 MG/2ML IJ SOLN
INTRAMUSCULAR | Status: DC | PRN
  Administered 2018-03-11: 4 mg via INTRAVENOUS

## 2018-03-11 MED ORDER — GLIPIZIDE 10 MG PO TABS
10.0000 mg | ORAL_TABLET | Freq: Two times a day (BID) | ORAL | Status: DC
  Administered 2018-03-11 – 2018-03-12 (×2): 10 mg via ORAL
  Filled 2018-03-11 (×4): qty 1

## 2018-03-11 MED ORDER — POTASSIUM CHLORIDE IN NACL 20-0.9 MEQ/L-% IV SOLN: INTRAVENOUS | Status: DC

## 2018-03-11 MED ORDER — LOSARTAN POTASSIUM 50 MG PO TABS
100.0000 mg | ORAL_TABLET | Freq: Every day | ORAL | Status: DC
  Administered 2018-03-11 – 2018-03-12 (×2): 100 mg via ORAL
  Filled 2018-03-11 (×2): qty 2

## 2018-03-11 MED ORDER — ACETAMINOPHEN 10 MG/ML IV SOLN
INTRAVENOUS | Status: AC
  Filled 2018-03-11: qty 100

## 2018-03-11 MED ORDER — ONDANSETRON HCL 4 MG PO TABS: 4.0000 mg | ORAL_TABLET | Freq: Four times a day (QID) | ORAL | Status: DC | PRN

## 2018-03-11 MED ORDER — HEPARIN SODIUM (PORCINE) 1000 UNIT/ML IJ SOLN
INTRAMUSCULAR | Status: DC | PRN
  Administered 2018-03-11: 5000 [IU]

## 2018-03-11 MED ORDER — AMLODIPINE BESYLATE 5 MG PO TABS
10.0000 mg | ORAL_TABLET | Freq: Every day | ORAL | Status: DC
  Administered 2018-03-12: 10 mg via ORAL
  Filled 2018-03-11: qty 2

## 2018-03-11 MED ORDER — CELECOXIB 200 MG PO CAPS
200.0000 mg | ORAL_CAPSULE | Freq: Two times a day (BID) | ORAL | Status: DC
  Administered 2018-03-11 – 2018-03-12 (×2): 200 mg via ORAL
  Filled 2018-03-11 (×2): qty 1

## 2018-03-11 MED ORDER — HYDROCHLOROTHIAZIDE 25 MG PO TABS
25.0000 mg | ORAL_TABLET | Freq: Every day | ORAL | Status: DC
  Administered 2018-03-12: 25 mg via ORAL
  Filled 2018-03-11: qty 1

## 2018-03-11 MED ORDER — PROPOFOL 10 MG/ML IV BOLUS
INTRAVENOUS | Status: DC | PRN
  Administered 2018-03-11: 100 mg via INTRAVENOUS
  Administered 2018-03-11: 20 mg via INTRAVENOUS

## 2018-03-11 MED ORDER — ROCURONIUM BROMIDE 10 MG/ML (PF) SYRINGE
PREFILLED_SYRINGE | INTRAVENOUS | Status: DC | PRN
  Administered 2018-03-11: 10 mg via INTRAVENOUS
  Administered 2018-03-11: 50 mg via INTRAVENOUS
  Administered 2018-03-11 (×2): 20 mg via INTRAVENOUS

## 2018-03-11 SURGICAL SUPPLY — 67 items
BAG DECANTER FOR FLEXI CONT (MISCELLANEOUS) ×2 IMPLANT
BASKET BONE COLLECTION (BASKET) ×2 IMPLANT
BENZOIN TINCTURE PRP APPL 2/3 (GAUZE/BANDAGES/DRESSINGS) ×2 IMPLANT
BLADE CLIPPER SURG (BLADE) IMPLANT
BUR MATCHSTICK NEURO 3.0 LAGG (BURR) ×2 IMPLANT
CANISTER SUCT 3000ML PPV (MISCELLANEOUS) ×2 IMPLANT
CARTRIDGE OIL MAESTRO DRILL (MISCELLANEOUS) ×1 IMPLANT
CONT SPEC 4OZ CLIKSEAL STRL BL (MISCELLANEOUS) ×2 IMPLANT
COVER BACK TABLE 60X90IN (DRAPES) ×2 IMPLANT
COVER WAND RF STERILE (DRAPES) IMPLANT
DERMABOND ADVANCED (GAUZE/BANDAGES/DRESSINGS) ×1
DERMABOND ADVANCED .7 DNX12 (GAUZE/BANDAGES/DRESSINGS) ×1 IMPLANT
DIFFUSER DRILL AIR PNEUMATIC (MISCELLANEOUS) ×2 IMPLANT
DRAPE C-ARM 42X72 X-RAY (DRAPES) ×4 IMPLANT
DRAPE LAPAROTOMY 100X72X124 (DRAPES) ×2 IMPLANT
DRAPE POUCH INSTRU U-SHP 10X18 (DRAPES) ×2 IMPLANT
DRAPE SURG 17X23 STRL (DRAPES) ×2 IMPLANT
DRSG OPSITE POSTOP 4X8 (GAUZE/BANDAGES/DRESSINGS) ×2 IMPLANT
DURAPREP 26ML APPLICATOR (WOUND CARE) ×2 IMPLANT
ELECT REM PT RETURN 9FT ADLT (ELECTROSURGICAL) ×2
ELECTRODE REM PT RTRN 9FT ADLT (ELECTROSURGICAL) ×1 IMPLANT
EVACUATOR 1/8 PVC DRAIN (DRAIN) ×2 IMPLANT
GAUZE 4X4 16PLY RFD (DISPOSABLE) IMPLANT
GLOVE BIO SURGEON STRL SZ7 (GLOVE) ×4 IMPLANT
GLOVE BIO SURGEON STRL SZ8 (GLOVE) ×4 IMPLANT
GLOVE BIOGEL PI IND STRL 7.0 (GLOVE) ×2 IMPLANT
GLOVE BIOGEL PI IND STRL 8 (GLOVE) ×1 IMPLANT
GLOVE BIOGEL PI INDICATOR 7.0 (GLOVE) ×2
GLOVE BIOGEL PI INDICATOR 8 (GLOVE) ×1
GLOVE ECLIPSE 7.5 STRL STRAW (GLOVE) ×4 IMPLANT
GOWN STRL REUS W/ TWL LRG LVL3 (GOWN DISPOSABLE) ×3 IMPLANT
GOWN STRL REUS W/ TWL XL LVL3 (GOWN DISPOSABLE) ×1 IMPLANT
GOWN STRL REUS W/TWL 2XL LVL3 (GOWN DISPOSABLE) ×2 IMPLANT
GOWN STRL REUS W/TWL LRG LVL3 (GOWN DISPOSABLE) ×3
GOWN STRL REUS W/TWL XL LVL3 (GOWN DISPOSABLE) ×1
HEMOSTAT POWDER KIT SURGIFOAM (HEMOSTASIS) ×2 IMPLANT
KIT BASIN OR (CUSTOM PROCEDURE TRAY) ×2 IMPLANT
KIT BONE MRW ASP ANGEL CPRP (KITS) ×2 IMPLANT
KIT TURNOVER KIT B (KITS) ×2 IMPLANT
MATRIX STRIP NEOCORE 12C (Putty) ×1 IMPLANT
MILL MEDIUM DISP (BLADE) ×2 IMPLANT
NEEDLE HYPO 25X1 1.5 SAFETY (NEEDLE) ×2 IMPLANT
NS IRRIG 1000ML POUR BTL (IV SOLUTION) ×2 IMPLANT
OIL CARTRIDGE MAESTRO DRILL (MISCELLANEOUS) ×2
PACK LAMINECTOMY NEURO (CUSTOM PROCEDURE TRAY) ×2 IMPLANT
PAD ARMBOARD 7.5X6 YLW CONV (MISCELLANEOUS) ×6 IMPLANT
PUTTY DBM ALLOSYNC PURE 10CC (Putty) ×2 IMPLANT
ROD TI LORDOTIC 5.5X110 (Rod) ×2 IMPLANT
ROD TI LORDOTIC 5.5X120 (Rod) ×2 IMPLANT
SCREW KODIAK 6.5X45 (Screw) ×6 IMPLANT
SCREW KODIAK 6.5X50MM (Screw) ×2 IMPLANT
SCREW SET SPINAL ARSENAL 47127 (Screw) ×4 IMPLANT
SET SCREW (Screw) ×4 IMPLANT
SET SCREW SPNE (Screw) ×4 IMPLANT
SPACER IDENTITI PS 5D 8X9X25 (Spacer) ×4 IMPLANT
SPONGE LAP 4X18 RFD (DISPOSABLE) IMPLANT
SPONGE SURGIFOAM ABS GEL 100 (HEMOSTASIS) ×2 IMPLANT
STRIP CLOSURE SKIN 1/2X4 (GAUZE/BANDAGES/DRESSINGS) ×2 IMPLANT
STRIP MATRIX NEOCORE 12CC (Putty) ×1 IMPLANT
SUT VIC AB 0 CT1 18XCR BRD8 (SUTURE) ×2 IMPLANT
SUT VIC AB 0 CT1 8-18 (SUTURE) ×2
SUT VIC AB 2-0 CP2 18 (SUTURE) ×4 IMPLANT
SUT VIC AB 3-0 SH 8-18 (SUTURE) ×4 IMPLANT
TOWEL GREEN STERILE (TOWEL DISPOSABLE) ×2 IMPLANT
TOWEL GREEN STERILE FF (TOWEL DISPOSABLE) ×2 IMPLANT
TRAY FOLEY MTR SLVR 16FR STAT (SET/KITS/TRAYS/PACK) ×2 IMPLANT
WATER STERILE IRR 1000ML POUR (IV SOLUTION) ×2 IMPLANT

## 2018-03-11 NOTE — Op Note (Signed)
03/11/2018  4:50 PM  PATIENT:  Alan [name]  78 y.o. 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 Decompressive lumbar laminectomy L4-5 requiring more work than would be required for a simple exposure of the disk for PLIF in order to adequately decompress the neural elements and address the spinal stenosis, with inferior facetectomies L5-S1 2. Posterior lumbar interbody fusion L4-5 using porous titanium interbody cages packed with morcellized allograft and autograft soaked with a bone marrow aspirate obtained through a separate fascial incision over the right iliac crest 3. Posterior fixation L2-S1 using atec cortical pedicle screws.  4. Intertransverse arthrodesis L3-S1 using morcellized autograft and allograft.  SURGEON:  Sherley Bounds, MD  ASSISTANTS: Glenford Peers FNP  ANESTHESIA:  General  EBL: 325 ml  Total I/O In: 1100 [I.V.:1000; Other:100] Out: 34 [Urine:340; Blood:325]  BLOOD ADMINISTERED:none  DRAINS: none   INDICATION FOR PROCEDURE: This patient presented with severe back and left leg pain. Imaging revealed recurrent severe spinal stenosis L4-5 below previous L2-3 L3-4 fusions. The patient tried a reasonable attempt at conservative medical measures without relief. I recommended decompression and instrumented fusion to address the stenosis as well as the segmental  instability.  Patient understood the risks, benefits, and alternatives and potential outcomes and wished to proceed.  PROCEDURE DETAILS:  The patient was brought to the operating room. After induction of generalized endotracheal anesthesia the patient was rolled into the prone position on chest rolls and all pressure points were padded. The patient's lumbar region was cleaned and then prepped with DuraPrep and draped in the usual sterile fashion. Anesthesia was injected and then a dorsal midline incision was  made and carried down to the lumbosacral fascia. The fascia was opened and the paraspinous musculature was taken down in a subperiosteal fashion to expose the previously placed hardware at L2-3 as well as the lateral facets at L3-4 and L4-5 and L5-S1. A self-retaining retractor was placed. Intraoperative fluoroscopy confirmed my level, and I started with the repeat decompression at L4-5.  Just prior to this, I dissected in a suprafascial plane to expose the iliac crest.  Open the fascia used a Jamshidi needle to extract 60 cc of bone marrow aspirate from the iliac crest.  This was then spun down by [facility] device and 2 to 4 cc of  BMAC was soaked on morselized allograft for later arthrodesis.  I dried the hole with Surgifoam and closed the fascia.  I then turned my attention to the decompression and complete lumbar laminectomies, hemi- facetectomies, and foraminotomies were performed at L4-5. The patient had significant spinal stenosis and this required more work than would be required for a simple exposure of the disc for posterior lumbar interbody fusion which would only require a limited laminotomy. Much more generous decompression and generous foraminotomy was undertaken in order to adequately decompress the neural elements and address the patient's leg pain. The yellow ligament was removed to expose the underlying dura and nerve roots, and generous foraminotomies were performed to adequately decompress the neural elements. Both the exiting and traversing nerve roots were decompressed on both sides until a coronary dilator passed easily along the nerve roots. Once the decompression was complete, I turned my attention to the posterior lower lumbar interbody fusion. The epidural venous vasculature was coagulated and cut sharply. Disc space was incised and the initial discectomy was performed with pituitary rongeurs. The disc space was distracted  with sequential distractors to a height of 8 mm. We then used a series  of scrapers and shavers to prepare the endplates for fusion. The midline was prepared with Epstein curettes. Once the complete discectomy was finished, we packed an appropriate sized interbody cage with local autograft and morcellized allograft, gently retracted the nerve root, and tapped the cage into position at L4-5.  The midline between the cages was packed with morselized autograft and allograft. We then turned our attention to the placement of the lower pedicle screws.  I remove the inferior half of the facets at L5-S1 and undercut the lateral recess to identify the pedicle. the pedicle screw entry zones were identified utilizing surface landmarks and fluoroscopy. I drilled into each pedicle utilizing the hand drill, and tapped each pedicle with the appropriate tap. We palpated with a ball probe to assure no break in the cortex. We then placed 6.5 x 40 mm into the pedicles bilaterally at L5 and S1. We then decorticated the transverse processes and laid a mixture of morcellized autograft and allograft out over these to perform intertransverse arthrodesis at L3-S1. We then placed lordotic rods into the multiaxial screw heads of the pedicle screws from L2-S1 (L4 was skipped because of the previously placed broken screws prevented placement of screws in these pedicles thus requiring the L2-S1 screw placement instead of simply L4-5) and locked these in position with the locking caps and anti-torque device. We then checked our construct with AP and lateral fluoroscopy. Irrigated with copious amounts of bacitracin-containing saline solution. Inspected the nerve roots once again to assure adequate decompression, lined to the dura with Gelfoam, placed powdered vancomycin into the wound, and closed the muscle and the fascia with 0 Vicryl. Closed the subcutaneous tissues with 2-0 Vicryl and subcuticular tissues with 3-0 Vicryl. The skin was closed with benzoin and Steri-Strips. Dressing was then applied, the patient was  awakened from general anesthesia and transported to the recovery room in stable condition. At the end of the procedure all sponge, needle and instrument counts were correct.   PLAN OF CARE: admit to inpatient  PATIENT DISPOSITION:  PACU - hemodynamically stable.   Delay start of Pharmacological VTE agent (>24hrs) due to surgical blood loss or risk of bleeding:  yes

## 2018-03-11 NOTE — Progress Notes (Signed)
[employer] rep paged about time of surgery.

## 2018-03-11 NOTE — Transfer of Care (Signed)
Immediate Anesthesia Transfer of Care Note  Patient: Alan [name]  Procedure(s) Performed: Posterior Lumbar Interbody Fusion - Lumbar Four-Five, segmental fixation Lumbar Five-Sacral One (N/A Back)  Patient Location: PACU  Anesthesia Type:General  Level of Consciousness: drowsy, patient cooperative and responds to stimulation  Airway & Oxygen Therapy: Patient Spontanous Breathing and Patient connected to nasal cannula oxygen  Post-op Assessment: Report given to RN  Post vital signs: Reviewed and stable  Last Vitals:  Vitals Value Taken Time  BP 105/61 03/11/2018  2:22 PM  Temp    Pulse 79 03/11/2018  2:25 PM  Resp 17 03/11/2018  2:25 PM  SpO2 94 % 03/11/2018  2:25 PM  Vitals shown include unvalidated device data.  Last Pain:  Vitals:   03/11/18 0859  PainSc: 4          Complications: No apparent anesthesia complications

## 2018-03-11 NOTE — Anesthesia Procedure Notes (Signed)
Procedure Name: Intubation Date/Time: 03/11/2018 10:06 AM Performed by: Jenne Campus, CRNA Pre-anesthesia Checklist: Patient identified, Emergency Drugs available, Suction available and Patient being monitored Patient Re-evaluated:Patient Re-evaluated prior to induction Oxygen Delivery Method: Circle System Utilized Preoxygenation: Pre-oxygenation with 100% oxygen Induction Type: IV induction Ventilation: Mask ventilation without difficulty Laryngoscope Size: Miller and 3 Grade View: Grade I Tube type: Oral Tube size: 7.5 mm Number of attempts: 1 Airway Equipment and Method: Stylet and Oral airway Placement Confirmation: ETT inserted through vocal cords under direct vision,  positive ETCO2 and breath sounds checked- equal and bilateral Secured at: 22 cm Tube secured with: Tape Dental Injury: Teeth and Oropharynx as per pre-operative assessment

## 2018-03-11 NOTE — H&P (Signed)
Subjective: Patient is a 78 y.o. male admitted for PLIF. Onset of symptoms was several months ago, gradually worsening since that time.  The pain is rated severe, and is located at the across the lower back and radiates to LLE. The pain is described as aching and occurs all day. The symptoms have been progressive. Symptoms are exacerbated by exercise. MRI or CT showed recurrent stenosis L4-5   Past Medical History:  Diagnosis Date  . A-fib ([place])   . Anticoagulant long-term use, on coumadin for PAF 03/08/2012  . Chronic back pain   . Complete heart block ([place])   . Coronary artery disease    seen at [facility] cardiology   . Diabetes mellitus   . History of kidney stones   . Hx of echocardiogram 06/29/2008   EF 45-50%   . Hypertension    Dr. Veronia Beets, primary  . Neuromuscular disorder ([place])    carpal tunnel bilaterally  . 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 Polio    hx of  . Sleep apnea    cpap, 11, last sleep study Jan2013, sees Dr. Keturah Barre  . Status post placement of cardiac pacemaker, 03/07/12 New Medtronic Gen and new Vent lead original placed 2008 03/08/2012    Past Surgical History:  Procedure Laterality Date  . APPENDECTOMY    . BACK SURGERY    . BILATERAL CARPAL TUNNEL RELEASE    . CARDIAC CATHETERIZATION    . CORONARY ARTERY BYPASS GRAFT  1990  . CORONARY ARTERY BYPASS GRAFT  2004  . ESOPHAGOGASTRODUODENOSCOPY Left 03/18/2013   Procedure: ESOPHAGOGASTRODUODENOSCOPY (EGD);  Surgeon: Cleotis Nipper, MD;  Location[facility] ENDOSCOPY;  Service: Endoscopy;  Laterality: Left;  . EYE SURGERY Bilateral    cataract surgery with lens implants  . FRACTURE SURGERY     Hx: of right leg  . HARDWARE REMOVAL N/A 05/18/2016   Procedure: Removal of broken hardware Lumbar three-four;  Surgeon: [name], MD;  Location: [place];  Service: Neurosurgery;  Laterality: N/A;  . HERNIA REPAIR    . INSERT / REPLACE / REMOVE PACEMAKER  03/07/2012   replaced  . JOINT REPLACEMENT     Hx: of  left knee  . LEFT HEART CATHETERIZATION WITH CORONARY/GRAFT ANGIOGRAM N/A 12/21/2010   Procedure: LEFT HEART CATHETERIZATION WITH Beatrix Fetters;  Surgeon: Leonie Man, MD;  Location: [facility] CATH LAB;  Service: Cardiovascular;  Laterality: N/A;  . LEG SURGERY Right    lengthened his leg  . LUMBAR LAMINECTOMY/DECOMPRESSION MICRODISCECTOMY  01/10/2012   Procedure: LUMBAR LAMINECTOMY/DECOMPRESSION MICRODISCECTOMY 1 LEVEL;  Surgeon: [name], MD;  Location: [place] NEURO ORS;  Service: Neurosurgery;  Laterality: Bilateral;  Lumbar three-four decompression, Posterior lateral fusion lumbar three-four, posterior spinus plate lumbar three-four  . LUMBAR LAMINECTOMY/DECOMPRESSION MICRODISCECTOMY Left 05/18/2016   Procedure: Laminectomy and Foraminotomy - Lumbar two-lumbar three- Lumbar four-lumbar five- left with removal hardware lumbar three-four;  Surgeon: [name], MD;  Location: [place];  Service: Neurosurgery;  Laterality: Left;  [name] Kitchen MAZE Procedure  2004  . NASAL SEPTUM SURGERY N/A 14  . PACEMAKER INSERTION  08, 1/14  . PACEMAKER REVISION N/A 03/07/2012   Procedure: PACEMAKER REVISION;  Surgeon: Sanda Klein, MD;  Location: [place] CATH LAB;  Service: Cardiovascular;  Laterality: N/A;  . POSTERIOR FUSION LUMBAR SPINE  08/28/2012   Dr Ronnald Ramp  . SHOULDER ARTHROSCOPY WITH OPEN ROTATOR CUFF REPAIR Right 03/30/2014   Procedure: RIGHT SHOULDER ARTHROSCOPY  OPEN ROTATOR CUFF REPAIR;  Surgeon: Kerin Salen, MD;  Location: [place];  Service: Orthopedics;  Laterality: Right;  . SHOULDER OPEN ROTATOR CUFF REPAIR Right 03/30/2014   Procedure: ROTATOR CUFF REPAIR SHOULDER OPEN;  Surgeon: Kerin Salen, MD;  Location: [place];  Service: Orthopedics;  Laterality: Right;  . STENTS    . TONSILLECTOMY      Prior to Admission medications   Medication Sig Start Date End Date Taking? Authorizing Provider  amLODipine (NORVASC) 10 MG tablet Take 10 mg by mouth daily.  04/23/17  Yes [provider]   carboxymethylcellulose (REFRESH PLUS) 0.5 % SOLN Place 1 drop into both eyes every morning.   Yes [provider]  carvedilol (COREG) 25 MG tablet TAKE 2 TABLETS BY MOUTH TWICE DAILY Patient taking differently: Take 50 mg by mouth 2 (two) times daily with a meal.  07/09/17  Yes [name], Mihai, MD  colchicine 0.6 MG tablet Take 0.6 mg by mouth 2 (two) times daily.  02/22/18  Yes [provider]  diclofenac sodium (VOLTAREN) 1 % GEL Apply 2 g topically daily as needed (pain).  01/23/18  Yes [provider]  ferrous sulfate 325 (65 FE) MG tablet Take 325 mg by mouth daily with breakfast.  02/06/18  Yes [provider]  fluticasone (FLONASE) 50 MCG/ACT nasal spray Place 2 sprays into both nostrils daily as needed for allergies.  02/11/18 02/11/19 Yes [provider]  glipiZIDE (GLUCOTROL) 10 MG tablet Take 10 mg by mouth 2 (two) times daily.    Yes [provider]  hydrochlorothiazide (HYDRODIURIL) 25 MG tablet Take 25 mg by mouth daily.   Yes [provider]  losartan (COZAAR) 100 MG tablet Take 100 mg by mouth daily.   Yes [provider]  metFORMIN (GLUCOPHAGE) 500 MG tablet Take 500 mg by mouth 4 (four) times daily.  08/24/14  Yes [provider]  pantoprazole (PROTONIX) 40 MG tablet Take 40 mg by mouth 2 (two) times daily.  02/06/18 04/07/18 Yes [provider]  Lancets (FREESTYLE) lancets Use to check blood sugar 4 time(s) daily. 01/23/18 01/23/19  [provider]   No Known Allergies  Social History   Tobacco Use  . Smoking status: Never Smoker  . Smokeless tobacco: Never Used  Substance Use Topics  . Alcohol use: Yes    Comment: rarely    Family History  Problem Relation Age of Onset  . Other Father        MVA  . Cancer Mother      Review of Systems  Positive ROS: neg  All other systems have been reviewed and were otherwise negative with the exception of those mentioned in the HPI and as  above.  Objective: Vital signs in last 24 hours: Temp:  [98.1 F (36.7 C)] 98.1 F (36.7 C) (02/03 0800) Pulse Rate:  [65] 65 (02/03 0800) Resp:  [20] 20 (02/03 0800) BP: (146)/(54) 146/54 (02/03 0800) SpO2:  [98 %] 98 % (02/03 0800)  General Appearance: Alert, cooperative, no distress, appears stated age Head: Normocephalic, without obvious abnormality, atraumatic Eyes: PERRL, conjunctiva/corneas clear, EOM's intact    Neck: Supple, symmetrical, trachea midline Back: Symmetric, no curvature, ROM normal, no CVA tenderness Lungs:  respirations unlabored Heart: Regular rate and rhythm Abdomen: Soft, non-tender Extremities: Extremities normal, atraumatic, no cyanosis or edema Pulses: 2+ and symmetric all extremities Skin: Skin color, texture, turgor normal, no rashes or lesions  NEUROLOGIC:   Mental status: Alert and oriented x4,  no aphasia, good attention span, fund of knowledge, and memory  Motor Exam - grossly normal Sensory Exam - grossly normal Reflexes: trace LLE, spastic RLE Coordination - grossly normal Gait -not tested Balance - grossly normal Cranial Nerves: I: smell Not tested  II: visual acuity  OS: nl    OD: nl  II: visual fields Full to confrontation  II: pupils Equal, round, reactive to light  III,VII: ptosis None  III,IV,VI: extraocular muscles  Full ROM  V: mastication Normal  V: facial light touch sensation  Normal  V,VII: corneal reflex  Present  VII: facial muscle function - upper  Normal  VII: facial muscle function - lower Normal  VIII: hearing Not tested  IX: soft palate elevation  Normal  IX,X: gag reflex Present  XI: trapezius strength  5/5  XI: sternocleidomastoid strength 5/5  XI: neck flexion strength  5/5  XII: tongue strength  Normal    Data Review Lab Results  Component Value Date   WBC 11.1 (H) 03/05/2018   HGB 9.8 (L) 03/05/2018   HCT 33.1 (L) 03/05/2018   MCV 91.7 03/05/2018   PLT 278 03/05/2018   Lab Results  Component  Value Date   NA 136 03/05/2018   K 4.6 03/05/2018   CL 107 03/05/2018   CO2 16 (L) 03/05/2018   BUN 34 (H) 03/05/2018   CREATININE 1.52 (H) 03/05/2018   GLUCOSE 119 (H) 03/05/2018   Lab Results  Component Value Date   INR 1.17 03/05/2018    Assessment/Plan:  Estimated body mass index is 29.92 kg/m as calculated from the following:   Height as of 03/05/18: 5\' 10"  (1.778 m).   Weight as of 03/05/18: 94.6 kg. Patient admitted for PLIF L4-5. Patient has failed a reasonable attempt at conservative therapy.  I explained the condition and procedure to the patient and answered any questions.  Patient wishes to proceed with procedure as planned. Understands risks/ benefits and typical outcomes of procedure.   [name] 03/11/2018 9:27 AM

## 2018-03-12 LAB — GLUCOSE, CAPILLARY
GLUCOSE-CAPILLARY: 135 mg/dL — AB (ref 70–99)
Glucose-Capillary: 219 mg/dL — ABNORMAL HIGH (ref 70–99)

## 2018-03-12 LAB — CBC WITH DIFFERENTIAL/PLATELET
Abs Immature Granulocytes: 0.11 10*3/uL — ABNORMAL HIGH (ref 0.00–0.07)
Basophils Absolute: 0 10*3/uL (ref 0.0–0.1)
Basophils Relative: 0 %
EOS PCT: 0 %
Eosinophils Absolute: 0 10*3/uL (ref 0.0–0.5)
HCT: 26.8 % — ABNORMAL LOW (ref 39.0–52.0)
Hemoglobin: 8.3 g/dL — ABNORMAL LOW (ref 13.0–17.0)
Immature Granulocytes: 1 %
Lymphocytes Relative: 7 %
Lymphs Abs: 1.1 10*3/uL (ref 0.7–4.0)
MCH: 27.8 pg (ref 26.0–34.0)
MCHC: 31 g/dL (ref 30.0–36.0)
MCV: 89.6 fL (ref 80.0–100.0)
MONOS PCT: 7 %
Monocytes Absolute: 1 10*3/uL (ref 0.1–1.0)
Neutro Abs: 12.1 10*3/uL — ABNORMAL HIGH (ref 1.7–7.7)
Neutrophils Relative %: 85 %
PLATELETS: 204 10*3/uL (ref 150–400)
RBC: 2.99 MIL/uL — ABNORMAL LOW (ref 4.22–5.81)
RDW: 14.9 % (ref 11.5–15.5)
WBC: 14.3 10*3/uL — ABNORMAL HIGH (ref 4.0–10.5)
nRBC: 0 % (ref 0.0–0.2)

## 2018-03-12 MED ORDER — OXYCODONE HCL 5 MG PO TABS: 5.0000 mg | ORAL_TABLET | ORAL | 0 refills | Status: DC | PRN

## 2018-03-12 MED ORDER — METHOCARBAMOL 500 MG PO TABS: 500.0000 mg | ORAL_TABLET | Freq: Four times a day (QID) | ORAL | 1 refills | Status: DC | PRN

## 2018-03-12 NOTE — Progress Notes (Signed)
Patient alert and oriented, mae's well, voiding adequate amount of urine, swallowing without difficulty, no c/o pain at time of discharge. Patient discharged home with family. Script and discharged instructions given to patient. Patient and family stated understanding of instructions given. Patient has an appointment with Dr. Jones °

## 2018-03-12 NOTE — Evaluation (Signed)
Physical Therapy Evaluation Patient Details Name: Alan [name] MRN: [id number] DOB: 06/19/1940 Today's Date: 03/12/2018   History of Present Illness  78 y.o. male admitted for PLIF. 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 S/p L4-5 decompression and fusion and posterior fixation L2-S1. 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  Clinical Impression  Pt admitted with above diagnosis. Pt currently with functional limitations due to the deficits listed below (see PT Problem List). At the time of PT eval pt was able to perform transfers and ambulation with close supervision for safety and no AD. Pt reports baseline antalgia due to history of Polio. Pt has been using a rollator at home for mobility and feel this is appropriate to continue for community distances. Pt was educated on car transfer, activity progression, brace application and wearing schedule, and general safety at home for fall prevention. Pt will benefit from skilled PT to increase their independence and safety with mobility to allow discharge to the venue listed below.       Follow Up Recommendations Home health PT;Supervision for mobility/OOB    Equipment Recommendations  None recommended by PT    Recommendations for Other Services       Precautions / Restrictions Precautions Precautions: Fall;Back Precaution Booklet Issued: Yes (comment) Precaution Comments: Recent fall - pt with bruising and abrasions on R side of face, R shoulder, trunk, and R finger tips. Required Braces or Orthoses: Spinal Brace Spinal Brace: Lumbar corset;Applied in sitting position(Required assist due to raw fingertips) Restrictions Weight Bearing Restrictions: No      Mobility  Bed Mobility               General bed mobility comments: Pt sitting at EOB upon arrival. Educating pt on log roll techniques and pt  verblized understanding  Transfers Overall transfer level: Needs assistance Equipment used: None Transfers: Sit to/from Stand Sit to Stand: Supervision         General transfer comment: Light supervision for safety. Pt was able to transition to standing without assistance or difficulty.   Ambulation/Gait Ambulation/Gait assistance: Supervision Gait Distance (Feet): 350 Feet Assistive device: None Gait Pattern/deviations: Step-through pattern;Decreased stride length;Antalgic;Decreased weight shift to right Gait velocity: Decreased Gait velocity interpretation: 1.31 - 2.62 ft/sec, indicative of limited community ambulator General Gait Details: Antalgic due to history of Polio and reports LLD s/p fixation. No assist required however feel pt would benefit from continued use of rollator for community distances at d/c.   Stairs            Wheelchair Mobility    Modified Rankin (Stroke Patients Only)       Balance Overall balance assessment: Needs assistance Sitting-balance support: Feet supported;No upper extremity supported Sitting balance-Leahy Scale: Good     Standing balance support: No upper extremity supported;During functional activity Standing balance-Leahy Scale: Fair                               Pertinent Vitals/Pain Pain Assessment: Faces Faces Pain Scale: Hurts little more Pain Location: Back Pain Descriptors / Indicators: Constant;Discomfort;Grimacing Pain Intervention(s): Monitored during session    Home Living Family/patient expects to be discharged to:: Private residence Living Arrangements: Alone Available Help at Discharge: Family;Available PRN/intermittently;Friend(s)(Daughter, granddaughter, and girlfriend) Type of Home: House Home Access: Stairs to enter   [employer] of Steps: 2-3 Home Layout: One  level Home Equipment: Walker - 2 wheels;Cane - single point;Shower seat;Shower seat - Multimedia programmer) Additional  Comments: Pet cat    Prior Function Level of Independence: Independent         Comments: ADLs, IADLs, driving     Hand Dominance   Dominant Hand: Right    Extremity/Trunk Assessment   Upper Extremity Assessment Upper Extremity Assessment: Overall WFL for tasks assessed    Lower Extremity Assessment Lower Extremity Assessment: RLE deficits/detail RLE Deficits / Details: Decreased strength and AROM consistent with PMH of Polio.     Cervical / Trunk Assessment Cervical / Trunk Assessment: Other exceptions Cervical / Trunk Exceptions: s/p back sx  Communication   Communication: No difficulties  Cognition Arousal/Alertness: Awake/alert Behavior During Therapy: WFL for tasks assessed/performed Overall Cognitive Status: Within Functional Limits for tasks assessed                                        General Comments      Exercises     Assessment/Plan    PT Assessment Patient needs continued PT services  PT Problem List Decreased strength;Decreased activity tolerance;Decreased balance;Decreased mobility;Decreased coordination;Decreased knowledge of use of DME;Decreased safety awareness;Decreased knowledge of precautions;Pain       PT Treatment Interventions DME instruction;Gait training;Stair training;Functional mobility training;Therapeutic activities;Therapeutic exercise;Neuromuscular re-education;Patient/family education    PT Goals (Current goals can be found in the Care Plan section)  Acute Rehab PT Goals Patient Stated Goal: "Go home to my cat" PT Goal Formulation: With patient Potential to Achieve Goals: Good    Frequency Min 5X/week   Barriers to discharge        Co-evaluation               AM-PAC PT "6 Clicks" Mobility  Outcome Measure Help needed turning from your back to your side while in a flat bed without using bedrails?: None Help needed moving from lying on your back to sitting on the side of a flat bed without using  bedrails?: None Help needed moving to and from a bed to a chair (including a wheelchair)?: None Help needed standing up from a chair using your arms (e.g., wheelchair or bedside chair)?: None Help needed to walk in hospital room?: None Help needed climbing 3-5 steps with a railing? : A Little 6 Click Score: 23    End of Session Equipment Utilized During Treatment: Back brace Activity Tolerance: Patient tolerated treatment well Patient left: in chair;with call bell/phone within reach Nurse Communication: Mobility status PT Visit Diagnosis: Unsteadiness on feet (R26.81);Pain Pain - part of body: (back)    Time: 5573-2202 PT Time Calculation (min) (ACUTE ONLY): 16 min   Charges:   PT Evaluation $PT Eval Moderate Complexity: 1 Mod          Rolinda Roan, PT, DPT Acute Rehabilitation Services Pager: [phone] Office: [phone]   Thelma Comp 03/12/2018, 10:49 AM

## 2018-03-12 NOTE — Care Management Note (Signed)
Case Management Note  Patient Details  Name: Alan [name] MRN: [id number] Date of Birth: 04-Mar-1940  Subjective/Objective:   78 yr old gentleman s/p L4-L5 PLIF.                 Action/Plan: Case manager spke with patient concerning discharge plan. Choice for [place] offered, referral called to Neoma Laming, [place] Liaison.  Patient will have family support at discharge.    Expected Discharge Date:  03/12/18               Expected Discharge Plan:  [place]  In-House Referral:  NA  Discharge planning Services  CM Consult  Post Acute Care Choice:  Home Health, Durable Medical Equipment Choice offered to:  Patient  DME Arranged:  3-N-1, Walker rolling DME Agency:  [place]:  PT, OT, Nurse's Aide [place] Agency:  [place]  Status of Service:  Completed, signed off  If discussed at [place] of Stay Meetings, dates discussed:    Additional Comments:  Ninfa Meeker, RN 03/12/2018, 11:29 AM

## 2018-03-12 NOTE — Evaluation (Signed)
Occupational Therapy Evaluation Patient Details Name: Alan [name] MRN: [id number] DOB: Mar 04, 1940 Today's Date: 03/12/2018    History of Present Illness 78 y.o. male admitted for PLIF. 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 S/p L4-5 decompression and fusion and posterior fixation L2-S1. 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     Clinical Impression   PTA, pt was living alone and was independent; reports that his family and significant other will be able to check in as needed. Pt currently requiring Supervision-Min Guard A for ADLs. Provided education and handout on back precautions, brace management, bed mobility, LB ADLs, toileting, and shower transfer. Answered all pt questions. Recommend dc home once medically stable per physician. All acute OT needs met and will sign off. Thank you.     Follow Up Recommendations  Home health OT;Supervision/Assistance - 24 hour    Equipment Recommendations  3 in 1 bedside commode    Recommendations for Other Services PT consult     Precautions / Restrictions Precautions Precautions: Fall;Back Precaution Booklet Issued: Yes (comment) Precaution Comments: Recent fall at home Required Braces or Orthoses: Spinal Brace Spinal Brace: Lumbar corset;Applied in sitting position      Mobility Bed Mobility               General bed mobility comments: Pt sitting at EOB upon arrival. Educating pt on log roll techniques and pt verblized understanding  Transfers Overall transfer level: Needs assistance                    Balance                                           ADL either performed or assessed with clinical judgement   ADL Overall ADL's : Needs assistance/impaired Eating/Feeding: Supervision/ safety;Set up;Sitting   Grooming: Supervision/safety;Set up;Sitting Grooming  Details (indicate cue type and reason): Discussed safety techniques for oral care Upper Body Bathing: Supervision/ safety;Set up;Sitting   Lower Body Bathing: Min guard;Sit to/from stand   Upper Body Dressing : Supervision/safety;Set up;Sitting   Lower Body Dressing: Min guard;With adaptive equipment;Sit to/from stand Lower Body Dressing Details (indicate cue type and reason): AE for LB dressing. Pt demonstrating understanding       Toileting - Clothing Manipulation Details (indicate cue type and reason): Educating pt on toilet hygiene techniques   Tub/Shower Transfer Details (indicate cue type and reason): Educating pt on safe shower transfers   General ADL Comments: Providing education and handout on back precautions and compensatory techniques for ADLs and functional mobility.     Vision         Perception     Praxis      Pertinent Vitals/Pain Pain Assessment: Faces Faces Pain Scale: Hurts little more Pain Location: Back Pain Descriptors / Indicators: Constant;Discomfort;Grimacing Pain Intervention(s): Monitored during session;Limited activity within patient's tolerance;Repositioned     Hand Dominance Right   Extremity/Trunk Assessment Upper Extremity Assessment Upper Extremity Assessment: Overall WFL for tasks assessed   Lower Extremity Assessment Lower Extremity Assessment: Defer to PT evaluation   Cervical / Trunk Assessment Cervical / Trunk Assessment: Other exceptions Cervical / Trunk Exceptions: s/p back sx   Communication Communication Communication: No difficulties   Cognition Arousal/Alertness: Awake/alert Behavior During Therapy: WFL for tasks assessed/performed Overall Cognitive Status:  Within Functional Limits for tasks assessed                                     General Comments       Exercises     Shoulder Instructions      Home Living Family/patient expects to be discharged to:: Private residence Living Arrangements:  Alone Available Help at Discharge: Family;Available PRN/intermittently;Friend(s)(Daughter, granddaughter, and girlfriend) Type of Home: House Home Access: Stairs to enter [employer] of Steps: 2-3   Home Layout: One level     Bathroom Shower/Tub: Walk-in shower;Tub/shower unit   Bathroom Toilet: Standard     Home Equipment: Environmental consultant - 2 wheels;Cane - single point;Shower seat;Shower seat - Multimedia programmer)   Additional Comments: Pet cat      Prior Functioning/Environment Level of Independence: Independent        Comments: ADLs, IADLs, driving        OT Problem List: Decreased strength;Decreased range of motion;Decreased activity tolerance;Impaired balance (sitting and/or standing);Decreased knowledge of use of DME or AE;Decreased knowledge of precautions;Pain      OT Treatment/Interventions:      OT Goals(Current goals can be found in the care plan section) Acute Rehab OT Goals Patient Stated Goal: "Go home to my cat" OT Goal Formulation: All assessment and education complete, DC therapy  OT Frequency:     Barriers to D/C:            Co-evaluation              AM-PAC OT "6 Clicks" Daily Activity     Outcome Measure Help from another person eating meals?: None Help from another person taking care of personal grooming?: None Help from another person toileting, which includes using toliet, bedpan, or urinal?: A Little Help from another person bathing (including washing, rinsing, drying)?: A Little Help from another person to put on and taking off regular upper body clothing?: None Help from another person to put on and taking off regular lower body clothing?: A Little 6 Click Score: 21   End of Session Equipment Utilized During Treatment: Back brace Nurse Communication: Mobility status;Precautions  Activity Tolerance: Patient tolerated treatment well Patient left: in bed;with call bell/phone within reach(EOB eating breakfast)  OT Visit  Diagnosis: Unsteadiness on feet (R26.81);Other abnormalities of gait and mobility (R26.89);Muscle weakness (generalized) (M62.81);Pain Pain - part of body: (Back)                Time: 0932-6712 OT Time Calculation (min): 21 min Charges:  OT General Charges $OT Visit: 1 Visit OT Evaluation $OT Eval Low Complexity: [place], OTR/L Acute Rehab Pager: [phone] Office: [place] 03/12/2018, 8:01 AM

## 2018-03-13 LAB — TYPE AND SCREEN
ABO/RH(D): A POS
Antibody Screen: NEGATIVE
Unit division: 0
Unit division: 0

## 2018-03-13 LAB — BPAM RBC
BLOOD PRODUCT EXPIRATION DATE: 202002152359
Blood Product Expiration Date: 202002152359
Unit Type and Rh: 6200
Unit Type and Rh: 6200

## 2018-03-13 NOTE — Anesthesia Postprocedure Evaluation (Signed)
Anesthesia Post Note  Patient: DELDRICK LINCH  Procedure(s) Performed: Posterior Lumbar Interbody Fusion - Lumbar Four-Five, segmental fixation Lumbar Five-Sacral One (N/A Back)     Patient location during evaluation: PACU Anesthesia Type: General Level of consciousness: awake and alert Pain management: pain level controlled Vital Signs Assessment: post-procedure vital signs reviewed and stable Respiratory status: spontaneous breathing, nonlabored ventilation, respiratory function stable and patient connected to nasal cannula oxygen Cardiovascular status: blood pressure returned to baseline and stable Postop Assessment: no apparent nausea or vomiting Anesthetic complications: no    Last Vitals:  Vitals:   03/12/18 0727 03/12/18 1303  BP: 134/64 (!) 106/49  Pulse: 67 66  Resp: 16 16  Temp: (!) 36.3 C (!) 36.4 C  SpO2: 99% 100%    Last Pain:  Vitals:   03/12/18 1415  TempSrc:   PainSc: 3                  Hitoshi Werts

## 2018-03-13 NOTE — Discharge Summary (Signed)
Physician Discharge Summary  Patient ID: Alan [name] MRN: [id number] DOB/AGE: 1940-02-14 78 y.o.  Admit date: 03/11/2018 Discharge date: 03/13/2018  Admission Diagnoses: adjacent level recurrent stenosis    Discharge Diagnoses: same   Discharged Condition: good  Hospital Course: The patient was admitted on 03/11/2018 and taken to the operating room where the patient underwent PLIF L4-5. The patient tolerated the procedure well and was taken to the recovery room and then to the floor in stable condition. The hospital course was routine. There were no complications. The wound remained clean dry and intact. Pt had appropriate back soreness. No complaints of leg pain or new N/T/W. The patient remained afebrile with stable vital signs, and tolerated a regular diet. The patient continued to increase activities, and pain was well controlled with oral pain medications.   Consults: None  Significant Diagnostic Studies:  Results for orders placed or performed during the hospital encounter of 03/11/18  Glucose, capillary  Result Value Ref Range   Glucose-Capillary 146 (H) 70 - 99 mg/dL  Glucose, capillary  Result Value Ref Range   Glucose-Capillary 182 (H) 70 - 99 mg/dL   Comment 1 Notify RN   Glucose, capillary  Result Value Ref Range   Glucose-Capillary 229 (H) 70 - 99 mg/dL   Comment 1 Notify RN    Comment 2 Document in Chart   CBC with Differential/Platelet  Result Value Ref Range   WBC 14.3 (H) 4.0 - 10.5 K/uL   RBC 2.99 (L) 4.22 - 5.81 MIL/uL   Hemoglobin 8.3 (L) 13.0 - 17.0 g/dL   HCT 26.8 (L) 39.0 - 52.0 %   MCV 89.6 80.0 - 100.0 fL   MCH 27.8 26.0 - 34.0 pg   MCHC 31.0 30.0 - 36.0 g/dL   RDW 14.9 11.5 - 15.5 %   Platelets 204 150 - 400 K/uL   nRBC 0.0 0.0 - 0.2 %   Neutrophils Relative % 85 %   Neutro Abs 12.1 (H) 1.7 - 7.7 K/uL   Lymphocytes Relative 7 %   Lymphs Abs 1.1 0.7 - 4.0 K/uL   Monocytes Relative 7 %   Monocytes Absolute 1.0 0.1 - 1.0 K/uL   Eosinophils  Relative 0 %   Eosinophils Absolute 0.0 0.0 - 0.5 K/uL   Basophils Relative 0 %   Basophils Absolute 0.0 0.0 - 0.1 K/uL   Immature Granulocytes 1 %   Abs Immature Granulocytes 0.11 (H) 0.00 - 0.07 K/uL  Glucose, capillary  Result Value Ref Range   Glucose-Capillary 271 (H) 70 - 99 mg/dL   Comment 1 Notify RN    Comment 2 Document in Chart   Glucose, capillary  Result Value Ref Range   Glucose-Capillary 219 (H) 70 - 99 mg/dL   Comment 1 Notify RN    Comment 2 Document in Chart   Glucose, capillary  Result Value Ref Range   Glucose-Capillary 135 (H) 70 - 99 mg/dL   Comment 1 Notify RN    Comment 2 Document in Chart   Type and screen  Result Value Ref Range   ABO/RH(D) A POS    Antibody Screen NEG    Sample Expiration 03/14/2018    Unit Number [id number]    Blood Component Type RED CELLS,LR    Unit division 00    Status of Unit ALLOCATED    Transfusion Status OK TO TRANSFUSE    Crossmatch Result      Compatible Performed at [facility], [address],  [place] [address]    Unit Number [id number]    Blood Component Type RED CELLS,LR    Unit division 00    Status of Unit ALLOCATED    Transfusion Status OK TO TRANSFUSE    Crossmatch Result Compatible   Prepare RBC (crossmatch)  Result Value Ref Range   Order Confirmation      ORDER PROCESSED BY BLOOD BANK Performed at [facility], [place] [address] [place], [place] [address]   BPAM [facility]  Result Value Ref Range   Blood Product Unit Number [id number]    Unit Type and Rh 6200    Blood Product Expiration Date [id number]    Blood Product Unit Number [id number]    Unit Type and Rh 0370    Blood Product Expiration Date 488891694503     Chest 2 View  Result Date: 03/05/2018 CLINICAL DATA:  Preop lumbar surgery EXAM: CHEST - 2 VIEW COMPARISON:  05/09/2016 FINDINGS: Prior CABG. Left pacer in place with leads in the right atrium and right ventricle, unchanged. Cardiomegaly. No confluent  opacities, effusions or edema. Degenerative changes in the shoulders and thoracic spine. No acute bony abnormality. IMPRESSION: Cardiomegaly.  No active disease. Electronically Signed   By: Rolm Baptise M.D.   On: 03/05/2018 15:32   Dg Lumbar Spine 2-3 Views  Result Date: 03/11/2018 CLINICAL DATA:  Revision of lumbar fusion. FLUOROSCOPY TIME:  1 minutes 10 seconds. Images: 5 EXAM: DG C-ARM 61-120 MIN; LUMBAR SPINE - 2-3 VIEW COMPARISON:  Jun 11, 2017 FINDINGS: A disc spacer device is seen at L3-4 and another at L2-3, both stable. By the end of the study, pedicle rods and screws are seen at L2, L3, L5, and S1. Disc spacer devices are seen at all 3 levels. Fractured screw ends are seen at the L4 level, stable. Laminectomy changes. IMPRESSION: Postsurgical changes as above. Electronically Signed   By: Dorise Bullion III M.D   On: 03/11/2018 18:35   Dg C-arm 1-60 Min  Result Date: 03/11/2018 CLINICAL DATA:  Revision of lumbar fusion. FLUOROSCOPY TIME:  1 minutes 10 seconds. Images: 5 EXAM: DG C-ARM 61-120 MIN; LUMBAR SPINE - 2-3 VIEW COMPARISON:  Jun 11, 2017 FINDINGS: A disc spacer device is seen at L3-4 and another at L2-3, both stable. By the end of the study, pedicle rods and screws are seen at L2, L3, L5, and S1. Disc spacer devices are seen at all 3 levels. Fractured screw ends are seen at the L4 level, stable. Laminectomy changes. IMPRESSION: Postsurgical changes as above. Electronically Signed   By: Dorise Bullion III M.D   On: 03/11/2018 18:35    Antibiotics:  Anti-infectives (From admission, onward)   Start     Dose/Rate Route Frequency Ordered Stop   03/11/18 1800  ceFAZolin (ANCEF) IVPB 2g/100 mL premix     2 g 200 mL/hr over 30 Minutes Intravenous Every 8 hours 03/11/18 1625 03/12/18 0205   03/11/18 1157  vancomycin (VANCOCIN) powder  Status:  Discontinued       As needed 03/11/18 1157 03/11/18 1416   03/11/18 1156  bacitracin 50,000 Units in sodium chloride 0.9 % 500 mL irrigation  Status:   Discontinued       As needed 03/11/18 1156 03/11/18 1416   03/11/18 0830  ceFAZolin (ANCEF) IVPB 2g/100 mL premix     2 g 200 mL/hr over 30 Minutes Intravenous On call to O.R. 03/11/18 0817 03/11/18 1024      Discharge Exam: Blood pressure (!) 106/49, pulse  66, temperature (!) 97.5 F (36.4 C), temperature source Oral, resp. rate 16, SpO2 100 %. Neurologic: Grossly normal Dressing dry  Discharge Medications:   Allergies as of 03/12/2018   No Known Allergies     Medication List    TAKE these medications   amLODipine 10 MG tablet Commonly known as:  NORVASC Take 10 mg by mouth daily.   carboxymethylcellulose 0.5 % Soln Commonly known as:  REFRESH PLUS Place 1 drop into both eyes every morning.   carvedilol 25 MG tablet Commonly known as:  COREG TAKE 2 TABLETS BY MOUTH TWICE DAILY What changed:  when to take this   colchicine 0.6 MG tablet Take 0.6 mg by mouth 2 (two) times daily.   diclofenac sodium 1 % Gel Commonly known as:  VOLTAREN Apply 2 g topically daily as needed (pain).   ferrous sulfate 325 (65 FE) MG tablet Take 325 mg by mouth daily with breakfast.   fluticasone 50 MCG/ACT nasal spray Commonly known as:  FLONASE Place 2 sprays into both nostrils daily as needed for allergies.   freestyle lancets Use to check blood sugar 4 time(s) daily.   glipiZIDE 10 MG tablet Commonly known as:  GLUCOTROL Take 10 mg by mouth 2 (two) times daily.   hydrochlorothiazide 25 MG tablet Commonly known as:  HYDRODIURIL Take 25 mg by mouth daily.   losartan 100 MG tablet Commonly known as:  COZAAR Take 100 mg by mouth daily.   metFORMIN 500 MG tablet Commonly known as:  GLUCOPHAGE Take 500 mg by mouth 4 (four) times daily.   methocarbamol 500 MG tablet Commonly known as:  ROBAXIN Take 1 tablet (500 mg total) by mouth every 6 (six) hours as needed for muscle spasms.   oxyCODONE 5 MG immediate release tablet Commonly known as:  Oxy IR/ROXICODONE Take 1 tablet  (5 mg total) by mouth every 4 (four) hours as needed for moderate pain ((score 4 to 6)).   pantoprazole 40 MG tablet Commonly known as:  PROTONIX Take 40 mg by mouth 2 (two) times daily.       Disposition: home   Final Dx: L4-5 plif  Discharge Instructions     Remove dressing in 72 hours   Complete by:  As directed    Call MD for:  difficulty breathing, headache or visual disturbances   Complete by:  As directed    Call MD for:  persistant nausea and vomiting   Complete by:  As directed    Call MD for:  redness, tenderness, or signs of infection (pain, swelling, redness, odor or green/yellow discharge around incision site)   Complete by:  As directed    Call MD for:  severe uncontrolled pain   Complete by:  As directed    Call MD for:  temperature >100.4   Complete by:  As directed    Diet - low sodium heart healthy   Complete by:  As directed    Increase activity slowly   Complete by:  As directed       Follow-up Information    [name], MD. Schedule an appointment as soon as possible for a visit in 2 week(s).   Specialty:  Neurosurgery Contact information: [address]  [place] [address] [phone]        [facility] Follow up.   Specialty:  Home Health Services Why:  A representative from [facility] information: [address] [place] [address] [phone]  Signed: [name] 03/13/2018, 9:40 AM

## 2018-04-10 ENCOUNTER — Telehealth: Payer: Self-pay

## 2018-04-10 ENCOUNTER — Ambulatory Visit (INDEPENDENT_AMBULATORY_CARE_PROVIDER_SITE_OTHER): Payer: Medicare HMO | Admitting: *Deleted

## 2018-04-10 DIAGNOSIS — R001 Bradycardia, unspecified: Secondary | ICD-10-CM

## 2018-04-10 DIAGNOSIS — I495 Sick sinus syndrome: Secondary | ICD-10-CM

## 2018-04-10 LAB — CUP PACEART REMOTE DEVICE CHECK
Battery Impedance: 330 Ohm
Battery Remaining Longevity: 102 mo
Battery Voltage: 2.79 V
Brady Statistic AP VP Percent: 2 %
Brady Statistic AP VS Percent: 95 %
Brady Statistic AS VP Percent: 0 %
Brady Statistic AS VS Percent: 3 %
Date Time Interrogation Session: 20200304121450
Implantable Lead Implant Date: 20080208
Implantable Lead Location: 753859
Implantable Lead Location: 753860
Implantable Lead Model: 5076
Implantable Pulse Generator Implant Date: 20140130
Lead Channel Impedance Value: 442 Ohm
Lead Channel Impedance Value: 500 Ohm
Lead Channel Pacing Threshold Amplitude: 0.625 V
Lead Channel Pacing Threshold Amplitude: 0.75 V
Lead Channel Pacing Threshold Pulse Width: 0.4 ms
Lead Channel Setting Pacing Amplitude: 2 V
Lead Channel Setting Pacing Amplitude: 2.5 V
Lead Channel Setting Sensing Sensitivity: 2 mV
MDC IDC LEAD IMPLANT DT: 20140130
MDC IDC MSMT LEADCHNL RA PACING THRESHOLD PULSEWIDTH: 0.4 ms
MDC IDC SET LEADCHNL RV PACING PULSEWIDTH: 0.4 ms

## 2018-04-10 NOTE — Telephone Encounter (Signed)
   [facility] Pre-operative Risk Assessment    Request for surgical clearance:  1. What type of surgery is being performed? Left shoulder scope rotator cuff repair  2. When is this surgery scheduled? TBD  3. What type of clearance is required (medical clearance vs. Pharmacy clearance to hold med vs. Both)? Medical  4. Are there any medications that need to be held prior to surgery and how long? None  5. Practice name and name of physician performing surgery? [facility]   6. What is your office phone number [phone]  Attn: Claiborne Billings [facility]   7.   What is your office fax number  [phone]  8.   Anesthesia type   Not listed   Kathyrn Lass 04/10/2018, 6:01 PM  _________________________________________________________________   (provider comments below)

## 2018-04-17 NOTE — Progress Notes (Signed)
Remote pacemaker transmission.   

## 2018-06-26 ENCOUNTER — Telehealth: Payer: Self-pay | Admitting: Cardiovascular Disease

## 2018-06-26 NOTE — Telephone Encounter (Signed)
   Primary Cardiologist:Mihai [name], MD  Chart reviewed as part of pre-operative protocol coverage. Because of Alan [name] past medical history and time since last visit, he/she will require a follow-up visit in order to better assess preoperative cardiovascular risk.  Pre-op covering staff: - Please schedule appointment and call patient to inform them.  Please schedule a virtual visit with Dr. Sallyanne Kuster - he also needs a pacemaker check for preop clearance.  - Please contact requesting surgeon's office via preferred method (i.e, phone, fax) to inform them of need for appointment prior to surgery.  If applicable, this message will also be routed to pharmacy pool and/or primary cardiologist for input on holding anticoagulant/antiplatelet agent as requested below so that this information is available at time of patient's appointment.   [place], PA  06/26/2018, 2:21 PM

## 2018-06-26 NOTE — Telephone Encounter (Signed)
Sorry that last message is in error - does not apply to this patient

## 2018-06-26 NOTE — Telephone Encounter (Signed)
I believe Alan [name] is at low risk for CV complications with shoulder surgery, but some precautions are needed. He is "pacer dependent" due to sinus node arrest ("atrially dependent" with intact AV conduction). Due to the proximity of the pacemaker to the surgical site, we can't just put a magnet on it. He needs the device programmed to a non-sensing mode (DOO) before surgery, then programmed back to DDDR after the surgery. I tried to call Alan [name], left voicemail.

## 2018-06-26 NOTE — Telephone Encounter (Signed)
I called and spoke with his wife. No further episodes of chest pain. He refused to go to ED. Does not sound like it was a coronary problem, could have been a medication side effect (he has stopped that new med). No change in cardiac plan. MCr

## 2018-06-26 NOTE — Telephone Encounter (Signed)
New Message     Alan [name] is calling from [facility] to follow up on a cardiac clearance     Please call back

## 2018-06-26 NOTE — Telephone Encounter (Signed)
Informed kelly - pre op clearance  Has been sent [facility] pre-op pool to finalized She verbalized understanding.

## 2018-06-26 NOTE — Telephone Encounter (Addendum)
1st attempt to reach pt to make appt for preop clearance. Lvm

## 2018-06-26 NOTE — Telephone Encounter (Signed)
The surgeon is Dr. Fredonia Highland. Sorry about that!

## 2018-06-26 NOTE — Telephone Encounter (Signed)
Please find out the name of the surgeon, not just the practice name, for all preop clearance requests. Sometimes direct communication with the surgeon is necessary MCr

## 2018-06-26 NOTE — Telephone Encounter (Signed)
   [facility] Pre-operative Risk Assessment    Request for surgical clearance:  1. What type of surgery is being performed? Left Shoulder Scope Rotator Cuff Repair   2. When is this surgery scheduled? TBD   3. What type of clearance is required (medical clearance vs. Pharmacy clearance to hold med vs. Both)? Both--Needs Pacer Check per clearance form  4. Are there any medications that need to be held prior to surgery and how long? Not specified   5. Practice name and name of physician performing surgery? [facility]   6. What is your office phone number [phone]     7.   What is your office fax number [phone] (ATTN: Claiborne Billings)  8.   Anesthesia type (None, local, MAC, general) ? Not specified   Alan [name] 06/26/2018, 11:38 AM  _________________________________________________________________   (provider comments below)

## 2018-06-28 ENCOUNTER — Encounter: Payer: Self-pay | Admitting: Cardiovascular Disease

## 2018-06-28 NOTE — Telephone Encounter (Signed)
Letter sent via Epic to Dr. Percell Miller.

## 2018-07-10 ENCOUNTER — Telehealth: Payer: Self-pay | Admitting: Student

## 2018-07-10 ENCOUNTER — Telehealth: Payer: Self-pay | Admitting: Cardiovascular Disease

## 2018-07-10 ENCOUNTER — Ambulatory Visit (INDEPENDENT_AMBULATORY_CARE_PROVIDER_SITE_OTHER): Payer: Medicare HMO | Admitting: *Deleted

## 2018-07-10 DIAGNOSIS — I495 Sick sinus syndrome: Secondary | ICD-10-CM | POA: Diagnosis not present

## 2018-07-10 LAB — CUP PACEART REMOTE DEVICE CHECK
Battery Impedance: 353 Ohm
Battery Remaining Longevity: 99 mo
Battery Voltage: 2.79 V
Brady Statistic AP VP Percent: 2 %
Brady Statistic AP VS Percent: 83 %
Brady Statistic AS VP Percent: 11 %
Brady Statistic AS VS Percent: 5 %
Date Time Interrogation Session: 20200603121011
Implantable Lead Implant Date: 20080208
Implantable Lead Implant Date: 20140130
Implantable Lead Location: 753859
Implantable Lead Location: 753860
Implantable Lead Model: 5076
Implantable Lead Model: 5076
Implantable Pulse Generator Implant Date: 20140130
Lead Channel Impedance Value: 467 Ohm
Lead Channel Impedance Value: 472 Ohm
Lead Channel Pacing Threshold Amplitude: 0.625 V
Lead Channel Pacing Threshold Amplitude: 0.75 V
Lead Channel Pacing Threshold Pulse Width: 0.4 ms
Lead Channel Pacing Threshold Pulse Width: 0.4 ms
Lead Channel Setting Pacing Amplitude: 2 V
Lead Channel Setting Pacing Amplitude: 2.5 V
Lead Channel Setting Pacing Pulse Width: 0.4 ms
Lead Channel Setting Sensing Sensitivity: 2 mV

## 2018-07-10 NOTE — Telephone Encounter (Signed)
Need to discuss anticoagulation and cardioversion. This will delay shoulder surgery by at least a month. See if you can make visit tomorrow

## 2018-07-10 NOTE — Telephone Encounter (Signed)
The patient has been called and was agreeable to do a telehealth visit tomorrow at 9 am with Dr. Sallyanne Kuster. He has consented to the telehealth visit and can either do a telephone or try [email or web address]

## 2018-07-10 NOTE — Telephone Encounter (Signed)
Dr. Loletha Grayer,   Pt has new Afib, persistent since 06/09/2018. No OAC.  He states he has felt "drained" and slightly more short of breath with his normal activity.  No other symptoms.   He has a pending shoulder surgery, with no date set currently.   Presenting rhythm 07/10/2018:

## 2018-07-10 NOTE — Telephone Encounter (Signed)
iphone/ consent/ my chart/ pre reg completed

## 2018-07-11 ENCOUNTER — Telehealth: Payer: Self-pay | Admitting: Cardiovascular Disease

## 2018-07-11 ENCOUNTER — Telehealth (INDEPENDENT_AMBULATORY_CARE_PROVIDER_SITE_OTHER): Payer: Medicare HMO | Admitting: Cardiovascular Disease

## 2018-07-11 ENCOUNTER — Other Ambulatory Visit: Payer: Self-pay

## 2018-07-11 ENCOUNTER — Encounter: Payer: Self-pay | Admitting: Cardiovascular Disease

## 2018-07-11 VITALS — BP 127/64 | HR 68 | Ht 70.0 in | Wt 208.0 lb

## 2018-07-11 DIAGNOSIS — E785 Hyperlipidemia, unspecified: Secondary | ICD-10-CM

## 2018-07-11 DIAGNOSIS — I25708 Atherosclerosis of coronary artery bypass graft(s), unspecified, with other forms of angina pectoris: Secondary | ICD-10-CM

## 2018-07-11 DIAGNOSIS — M1A0710 Idiopathic chronic gout, right ankle and foot, without tophus (tophi): Secondary | ICD-10-CM

## 2018-07-11 DIAGNOSIS — I25718 Atherosclerosis of autologous vein coronary artery bypass graft(s) with other forms of angina pectoris: Secondary | ICD-10-CM | POA: Diagnosis not present

## 2018-07-11 DIAGNOSIS — N183 Chronic kidney disease, stage 3 unspecified: Secondary | ICD-10-CM

## 2018-07-11 DIAGNOSIS — I495 Sick sinus syndrome: Secondary | ICD-10-CM

## 2018-07-11 DIAGNOSIS — Z951 Presence of aortocoronary bypass graft: Secondary | ICD-10-CM

## 2018-07-11 DIAGNOSIS — I4819 Other persistent atrial fibrillation: Secondary | ICD-10-CM

## 2018-07-11 DIAGNOSIS — I472 Ventricular tachycardia: Secondary | ICD-10-CM

## 2018-07-11 DIAGNOSIS — E669 Obesity, unspecified: Secondary | ICD-10-CM

## 2018-07-11 DIAGNOSIS — I4729 Other ventricular tachycardia: Secondary | ICD-10-CM

## 2018-07-11 DIAGNOSIS — Z950 Presence of cardiac pacemaker: Secondary | ICD-10-CM

## 2018-07-11 DIAGNOSIS — G4733 Obstructive sleep apnea (adult) (pediatric): Secondary | ICD-10-CM

## 2018-07-11 DIAGNOSIS — E1169 Type 2 diabetes mellitus with other specified complication: Secondary | ICD-10-CM

## 2018-07-11 DIAGNOSIS — I10 Essential (primary) hypertension: Secondary | ICD-10-CM

## 2018-07-11 DIAGNOSIS — E782 Mixed hyperlipidemia: Secondary | ICD-10-CM

## 2018-07-11 MED ORDER — APIXABAN 5 MG PO TABS: 5.0000 mg | ORAL_TABLET | Freq: Two times a day (BID) | ORAL | 3 refills | Status: DC

## 2018-07-11 NOTE — Telephone Encounter (Signed)
Spoke with pt and informed him that per Dr. Lurline Del nurse, she will contact him tomorrow to arrange cardioversion and go over instructions. Pt voiced understanding.  Pt report he attempted to pick up Eliquis prescription but it was over 100 dollars. Pt wanted to make MD aware he can't afford medication. Will route message to Care coordinator for assistance.

## 2018-07-11 NOTE — Patient Instructions (Addendum)
Medication Instructions:  Start Eliquis 5 mg twice daily. Samples have been provided  If you need a refill on your cardiac medications before your next appointment, please call your pharmacy.   Testing/Procedures: You are scheduled for a Cardioversion on 08/06/2018 with Dr. Lorenza Cambridge.  Please arrive at the [facility] (Main Entrance A) at [facility]: [address] [place], [place] [address] at 12 noon. (1 hour prior to procedure)  DIET: Nothing to eat or drink after midnight except a sip of water with medications (see medication instructions below)  Medication Instructions: Hold: Nothing to hold  Continue your anticoagulant: Eliquis. You will need to continue your anticoagulant after your procedure until you are told by your provider that it is safe to stop   Labs:  Your provider would like for you to return on 08/01/2018 to have the following labs drawn: BMET and CBC. You do not need an appointment for the lab. Once in our office lobby there is a podium where you can sign in and ring the doorbell to alert [place] that you are here. The lab is open from 8:00 am to 4:30 pm; closed for lunch from 12:45pm-1:45pm.  After you have had the labs drawn at the [facility] office, please proceed to [facility] to have the Coronavirus test completed. You have an appointment at 11:50 am on 08/01/2018. Please be sure to have the labs drawn at the [facility] office first before the corona test. You will need to be quarantined after the test.    You must have a responsible person to drive you home and stay in the waiting area during your procedure. Failure to do so could result in cancellation.  Bring your insurance cards.  *Special Note: Every effort is made to have your procedure done on time. Occasionally there are emergencies that occur at the hospital that may cause delays. Please be patient if a delay does occur.    Follow-Up: At [facility], you and your health needs are our  priority.  As part of our continuing mission to provide you with exceptional heart care, we have created designated Provider Care Teams.  These Care Teams include your primary Cardiologist (physician) and Advanced Practice Providers (APPs -  Physician Assistants and Nurse Practitioners) who all work together to provide you with the care you need, when you need it. You will need a follow up appointment in 1 month. You may see Sanda Klein, MD or one of the following Advanced Practice Providers on your designated Care Team: [place], [place] . Fabian Sharp, PA-C

## 2018-07-11 NOTE — Telephone Encounter (Signed)
LMOM for patient with Eliquis ph # to get free 30 day card and let him know we have 4 wks of samples at the front desk.

## 2018-07-11 NOTE — Telephone Encounter (Signed)
Thank you both. What is the best way to get him the card and samples. Should he drop by which office?

## 2018-07-11 NOTE — H&P (View-Only) (Signed)
Virtual Visit via Telephone Note   This visit type was conducted due to national recommendations for restrictions regarding the COVID-19 Pandemic (e.g. social distancing) in an effort to limit this patient's exposure and mitigate transmission in our community.  Due to his co-morbid illnesses, this patient is at least at moderate risk for complications without adequate follow up.  This format is felt to be most appropriate for this patient at this time.  The patient did not have access to video technology/had technical difficulties with video requiring transitioning to audio format only (telephone).  All issues noted in this document were discussed and addressed.  No physical exam could be performed with this format.  Please refer to the patient's chart for his  consent to telehealth for [facility].   Date:  07/11/2018   ID:  Alan [name], DOB 08-12-1940, MRN [id number]  Patient Location: Home Provider Location: Home  PCP:  [name], MD  Cardiologist:  Sanda Klein, MD  Electrophysiologist:  None   Evaluation Performed:  Follow-Up Visit  Chief Complaint:  AFib  History of Present Illness:    Alan [name] is a 78 y.o. 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  [name] was getting ready to schedule left shoulder surgery but has been having reduced energy and poor stamina over the last few weeks.  He has noticed that he staggers a bit to one side or another when he walks.  Interrogation of his pacemaker shows that he has been in persistent atrial  fibrillation (possibly atrial flutter?) since May ).  This coincides to some degree with his reduced functional status.  He has not had overt exertional dyspnea and does not have any change in his pattern of CCS class II stable angina pectoris (roughly 300 feet walking).  He does not have orthopnea, PND or leg edema.  He denies syncope and is not aware of palpitations.  He has not had any recent GI bleeding, but had previous GI bleeding requiring transfusions, for which reason he was no longer taking anticoagulants.  Otherwise his dual-chamber Medtronic Adapta pacemaker is functioning normally.  Estimated generator longevity is 8 years.  Lead parameters are excellent (no detected P waves).  He has not had any recent episodes of nonsustained VT (he has fairly lengthy, but nonsustained episodes that occur once every 2-3 month or so in the past).  As mentioned he has been in uninterrupted atrial fibrillation with relatively regular intervals (consider atypical atrial flutter) since May 3.  The ventricular rate is not particularly fast.  For the most part he seems to have an atrial cycle length of around 290 ms (205 beats a minute) with 3:1 AV conduction and a ventricular rate of 70-80 bpm  The patient does not have symptoms concerning for COVID-19 infection (fever, chills, cough, or new shortness of breath).    Past Medical History:  Diagnosis Date  . A-fib ([place])   . Anticoagulant long-term use, on coumadin for PAF 03/08/2012  . Chronic back pain   . Complete heart block ([place])   . Coronary artery disease    seen at [facility] cardiology   . Diabetes mellitus   .  History of kidney stones   . Hx of echocardiogram 06/29/2008   EF 45-50%   . Hypertension    Dr. Veronia Beets, primary  . Neuromuscular disorder ([place])    carpal tunnel bilaterally  . 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 Polio    hx of  . Sleep apnea    cpap, 11, last sleep study Jan2013, sees Dr. Keturah Barre  . Status post placement  of cardiac pacemaker, 03/07/12 New Medtronic Gen and new Vent lead original placed 2008 03/08/2012   Past Surgical History:  Procedure Laterality Date  . APPENDECTOMY    . BACK SURGERY    . BILATERAL CARPAL TUNNEL RELEASE    . CARDIAC CATHETERIZATION    . CORONARY ARTERY BYPASS GRAFT  1990  . CORONARY ARTERY BYPASS GRAFT  2004  . ESOPHAGOGASTRODUODENOSCOPY Left 03/18/2013   Procedure: ESOPHAGOGASTRODUODENOSCOPY (EGD);  Surgeon: Cleotis Nipper, MD;  Location[facility] ENDOSCOPY;  Service: Endoscopy;  Laterality: Left;  . EYE SURGERY Bilateral    cataract surgery with lens implants  . FRACTURE SURGERY     Hx: of right leg  . HARDWARE REMOVAL N/A 05/18/2016   Procedure: Removal of broken hardware Lumbar three-four;  Surgeon: [name], MD;  Location: [place];  Service: Neurosurgery;  Laterality: N/A;  . HERNIA REPAIR    . INSERT / REPLACE / REMOVE PACEMAKER  03/07/2012   replaced  . JOINT REPLACEMENT     Hx: of left knee  . LEFT HEART CATHETERIZATION WITH CORONARY/GRAFT ANGIOGRAM N/A 12/21/2010   Procedure: LEFT HEART CATHETERIZATION WITH Beatrix Fetters;  Surgeon: Leonie Man, MD;  Location: [facility] CATH LAB;  Service: Cardiovascular;  Laterality: N/A;  . LEG SURGERY Right    lengthened his leg  . LUMBAR LAMINECTOMY/DECOMPRESSION MICRODISCECTOMY  01/10/2012   Procedure: LUMBAR LAMINECTOMY/DECOMPRESSION MICRODISCECTOMY 1 LEVEL;  Surgeon: [name], MD;  Location: [place] NEURO ORS;  Service: Neurosurgery;  Laterality: Bilateral;  Lumbar three-four decompression, Posterior lateral fusion lumbar three-four, posterior spinus plate lumbar three-four  . LUMBAR LAMINECTOMY/DECOMPRESSION MICRODISCECTOMY Left 05/18/2016   Procedure: Laminectomy and Foraminotomy - Lumbar two-lumbar three- Lumbar four-lumbar five- left with removal hardware lumbar three-four;  Surgeon: [name], MD;  Location: [place];  Service: Neurosurgery;  Laterality: Left;  [name] Kitchen MAZE Procedure  2004  . NASAL SEPTUM SURGERY N/A  14  . PACEMAKER INSERTION  08, 1/14  . PACEMAKER REVISION N/A 03/07/2012   Procedure: PACEMAKER REVISION;  Surgeon: Sanda Klein, MD;  Location: [place] CATH LAB;  Service: Cardiovascular;  Laterality: N/A;  . POSTERIOR FUSION LUMBAR SPINE  08/28/2012   Dr Ronnald Ramp  . SHOULDER ARTHROSCOPY WITH OPEN ROTATOR CUFF REPAIR Right 03/30/2014   Procedure: RIGHT SHOULDER ARTHROSCOPY  OPEN ROTATOR CUFF REPAIR;  Surgeon: Kerin Salen, MD;  Location: [place];  Service: Orthopedics;  Laterality: Right;  . SHOULDER OPEN ROTATOR CUFF REPAIR Right 03/30/2014   Procedure: ROTATOR CUFF REPAIR SHOULDER OPEN;  Surgeon: Kerin Salen, MD;  Location: [place];  Service: Orthopedics;  Laterality: Right;  . STENTS    . TONSILLECTOMY       Current Meds  Medication Sig  . amLODipine (NORVASC) 10 MG tablet Take 10 mg by mouth daily.   [name] Kitchen apixaban (ELIQUIS) 5 MG TABS tablet Take 1 tablet (5 mg total) by mouth 2 (two) times daily.  . carboxymethylcellulose (REFRESH PLUS) 0.5 % SOLN Place 1 drop into both eyes every morning.  . carvedilol (COREG) 25 MG tablet TAKE 2 TABLETS BY MOUTH  TWICE DAILY (Patient taking differently: Take 50 mg by mouth 2 (two) times daily with a meal. )  . colchicine 0.6 MG tablet Take 0.6 mg by mouth 2 (two) times daily.   . diclofenac sodium (VOLTAREN) 1 % GEL Apply 2 g topically daily as needed (pain).   . ferrous sulfate 325 (65 FE) MG tablet Take 325 mg by mouth daily with breakfast.   . fluticasone (FLONASE) 50 MCG/ACT nasal spray Place 2 sprays into both nostrils daily as needed for allergies.   [name] Kitchen glipiZIDE (GLUCOTROL) 10 MG tablet Take 10 mg by mouth 2 (two) times daily.   . hydrochlorothiazide (HYDRODIURIL) 25 MG tablet Take 25 mg by mouth daily.  . Lancets (FREESTYLE) lancets Use to check blood sugar 4 time(s) daily.  [name] Kitchen losartan (COZAAR) 100 MG tablet Take 100 mg by mouth daily.  . metFORMIN (GLUCOPHAGE) 500 MG tablet Take 500 mg by mouth 4 (four) times daily.   . methocarbamol (ROBAXIN) 500 MG  tablet Take 1 tablet (500 mg total) by mouth every 6 (six) hours as needed for muscle spasms.  [name] Kitchen oxyCODONE (OXY IR/ROXICODONE) 5 MG immediate release tablet Take 1 tablet (5 mg total) by mouth every 4 (four) hours as needed for moderate pain ((score 4 to 6)).     Allergies:   Patient has no known allergies.   Social History   Tobacco Use  . Smoking status: Never Smoker  . Smokeless tobacco: Never Used  Substance Use Topics  . Alcohol use: Yes    Comment: rarely  . Drug use: No     Family Hx: The patient's family history includes Cancer in his mother; Other in his father.  ROS:   Please see the history of present illness.    Left shoulder pain All other systems reviewed and are negative.   Prior CV studies:   The following studies were reviewed today:  Comprehensive pacemaker download from June 3  Labs/Other Tests and Data Reviewed:    EKG:  Intracardiac electrogram shows atrial sensed (atrial fibrillation/flutter), ventricular sensed rhythm  Recent Labs: 03/05/2018: BUN 34; Creatinine, Ser 1.52; Potassium 4.6; Sodium 136 03/12/2018: Hemoglobin 8.3; Platelets 204   Recent Lipid Panel Lab Results  Component Value Date/Time   CHOL 134 12/20/2010 09:03 AM   TRIG 104 12/20/2010 09:03 AM   HDL 38 (L) 12/20/2010 09:03 AM   CHOLHDL 3.5 12/20/2010 09:03 AM   LDLCALC 75 12/20/2010 09:03 AM    Wt Readings from Last 3 Encounters:  07/11/18 208 lb (94.3 kg)  03/05/18 208 lb 8 oz (94.6 kg)  02/26/18 203 lb 6.4 oz (92.3 kg)     Objective:    Vital Signs:  BP 127/64 (BP Location: Left Arm)   Pulse 68   Ht 5\' 10"  (1.778 m)   Wt 208 lb (94.3 kg)   BMI 29.84 kg/m    VITAL SIGNS:  reviewed Unable to examine  ASSESSMENT & PLAN:    1. AFib:  He is symptomatic and will benefit from cardioversion.It is possible that this is atrial flutter and might be amenable to overdrive pacing.  The way he first needs to be anticoagulated for a minimum of 3 weeks.  Prefer Eliquis due to  his history of GI bleeding.  Although his creatinine is abnormal, he is still under the age of 31 and is not bleeding.  Will use the full 5 mg twice daily dose.  May elect to stop it again to reduce the risk of recurrent GI bleeding,  but only after at least 30 days of anticoagulation following cardioversion. This procedure has been fully reviewed with the patient and informed consent has been obtained. CHADSVasc score 4 (Age, vascular disease, diabetes mellitus). Note moderate left atrial dilation previous echo, may have to discuss antiarrhythmic therapy.  Multaq, sotalol or dofetilide may be good choices for him 2. CAD: No change in his usual pattern of exertional angina, on beta-blocker and amlodipine.  Ischemic evaluation does not appear to be necessary at this time. 3. NSVT: None seen recently.  Normal left ventricular systolic function only assessed by echo in September 2017.   4. SSS: He has sinus node arrest and is "atrially dependent". 5. PM: Normal device function.  Continue remote downloads every 3 months and yearly office visits. 6. Obesity/DM: He has made good progress with weight loss this year.  Most recent hemoglobin A1c showed that glucose is well controlled. 7. Shoulder surgery: This will have to be postponed for at least 30 days following his cardioversion, since he will not be able to stop anticoagulants.  Since the surgery is on his left shoulder, his pacemaker will have to be reprogrammed to a non-sensing mode before surgery.  COVID-19 Education: The signs and symptoms of COVID-19 were discussed with the patient and how to seek care for testing (follow up with PCP or arrange E-visit).  The importance of social distancing was discussed today.  Time:   Today, I have spent 25 minutes with the patient with telehealth technology discussing the above problems.     Medication Adjustments/Labs and Tests Ordered: Current medicines are reviewed at length with the patient today.  Concerns  regarding medicines are outlined above.   Tests Ordered: No orders of the defined types were placed in this encounter.   Medication Changes: Meds ordered this encounter  Medications  . apixaban (ELIQUIS) 5 MG TABS tablet    Sig: Take 1 tablet (5 mg total) by mouth 2 (two) times daily.    Dispense:  60 tablet    Refill:  3    Disposition:  Follow up Cardioversion, tentatively scheduled for Tuesday, June 30  Signed, Sanda Klein, MD  07/11/2018 9:40 AM    Alan [name]

## 2018-07-11 NOTE — Telephone Encounter (Signed)
Pt informed samples at the front desk for him to pick up. Pt voiced understanding.

## 2018-07-11 NOTE — Telephone Encounter (Signed)
We can give a 30 day free card to get the first month in.  Can he swing the one month?  We do have some samples in, if it is truly cost prohibitive

## 2018-07-11 NOTE — Telephone Encounter (Signed)
Patient called stating Dr. Sallyanne Kuster advised him to call and ask to speak to [facility].

## 2018-07-11 NOTE — Progress Notes (Signed)
Virtual Visit via Telephone Note   This visit type was conducted due to national recommendations for restrictions regarding the COVID-19 Pandemic (e.g. social distancing) in an effort to limit this patient's exposure and mitigate transmission in our community.  Due to his co-morbid illnesses, this patient is at least at moderate risk for complications without adequate follow up.  This format is felt to be most appropriate for this patient at this time.  The patient did not have access to video technology/had technical difficulties with video requiring transitioning to audio format only (telephone).  All issues noted in this document were discussed and addressed.  No physical exam could be performed with this format.  Please refer to the patient's chart for his  consent to telehealth for [facility].   Date:  07/11/2018   ID:  Alan [name], DOB [birth year], MRN [id number]  Patient Location: Home Provider Location: Home  PCP:  [name], MD  Cardiologist:  Sanda Klein, MD  Electrophysiologist:  None   Evaluation Performed:  Follow-Up Visit  Chief Complaint:  AFib  History of Present Illness:    Alan [name] is a 78 y.o. 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  Alan [name] was getting ready to schedule left shoulder surgery but has been having reduced energy and poor stamina over the last few weeks.  He has noticed that he staggers a bit to one side or another when he walks.  Interrogation of his pacemaker shows that he has been in persistent atrial  fibrillation (possibly atrial flutter?) since May ).  This coincides to some degree with his reduced functional status.  He has not had overt exertional dyspnea and does not have any change in his pattern of CCS class II stable angina pectoris (roughly 300 feet walking).  He does not have orthopnea, PND or leg edema.  He denies syncope and is not aware of palpitations.  He has not had any recent GI bleeding, but had previous GI bleeding requiring transfusions, for which reason he was no longer taking anticoagulants.  Otherwise his dual-chamber Medtronic Adapta pacemaker is functioning normally.  Estimated generator longevity is 8 years.  Lead parameters are excellent (no detected P waves).  He has not had any recent episodes of nonsustained VT (he has fairly lengthy, but nonsustained episodes that occur once every 2-3 month or so in the past).  As mentioned he has been in uninterrupted atrial fibrillation with relatively regular intervals (consider atypical atrial flutter) since May 3.  The ventricular rate is not particularly fast.  For the most part he seems to have an atrial cycle length of around 290 ms (205 beats a minute) with 3:1 AV conduction and a ventricular rate of 70-80 bpm  The patient does not have symptoms concerning for COVID-19 infection (fever, chills, cough, or new shortness of breath).    Past Medical History:  Diagnosis Date  . A-fib ([place])   . Anticoagulant long-term use, on coumadin for PAF 03/08/2012  . Chronic back pain   . Complete heart block ([place])   . Coronary artery disease    seen at [facility] cardiology   . Diabetes mellitus   .  History of kidney stones   . Hx of echocardiogram 06/29/2008   EF 45-50%   . Hypertension    Dr. Veronia Beets, primary  . Neuromuscular disorder ([place])    carpal tunnel bilaterally  . 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 Polio    hx of  . Sleep apnea    cpap, 11, last sleep study Jan2013, sees Dr. Keturah Barre  . Status post placement  of cardiac pacemaker, 03/07/12 New Medtronic Gen and new Vent lead original placed 2008 03/08/2012   Past Surgical History:  Procedure Laterality Date  . APPENDECTOMY    . BACK SURGERY    . BILATERAL CARPAL TUNNEL RELEASE    . CARDIAC CATHETERIZATION    . CORONARY ARTERY BYPASS GRAFT  1990  . CORONARY ARTERY BYPASS GRAFT  2004  . ESOPHAGOGASTRODUODENOSCOPY Left 03/18/2013   Procedure: ESOPHAGOGASTRODUODENOSCOPY (EGD);  Surgeon: Cleotis Nipper, MD;  Location[facility] ENDOSCOPY;  Service: Endoscopy;  Laterality: Left;  . EYE SURGERY Bilateral    cataract surgery with lens implants  . FRACTURE SURGERY     Hx: of right leg  . HARDWARE REMOVAL N/A 05/18/2016   Procedure: Removal of broken hardware Lumbar three-four;  Surgeon: [name], MD;  Location: [place];  Service: Neurosurgery;  Laterality: N/A;  . HERNIA REPAIR    . INSERT / REPLACE / REMOVE PACEMAKER  03/07/2012   replaced  . JOINT REPLACEMENT     Hx: of left knee  . LEFT HEART CATHETERIZATION WITH CORONARY/GRAFT ANGIOGRAM N/A 12/21/2010   Procedure: LEFT HEART CATHETERIZATION WITH Beatrix Fetters;  Surgeon: Leonie Man, MD;  Location[facility] C[facility] LAB;  Service: Cardiovascular;  Laterality: N/A;  . LEG SURGERY Right    lengthened his leg  . LUMBAR LAMINECTOMY/DECOMPRESSION MICRODISCECTOMY  01/10/2012   Procedure: LUMBAR LAMINECTOMY/DECOMPRESSION MICRODISCECTOMY 1 LEVEL;  Surgeon: [name], MD;  Location: [place] NEURO ORS;  Service: Neurosurgery;  Laterality: Bilateral;  Lumbar three-four decompression, Posterior lateral fusion lumbar three-four, posterior spinus plate lumbar three-four  . LUMBAR LAMINECTOMY/DECOMPRESSION MICRODISCECTOMY Left 05/18/2016   Procedure: Laminectomy and Foraminotomy - Lumbar two-lumbar three- Lumbar four-lumbar five- left with removal hardware lumbar three-four;  Surgeon: [name], MD;  Location: [place];  Service: Neurosurgery;  Laterality: Left;  [name] Kitchen MAZE Procedure  2004  . NASAL SEPTUM SURGERY N/A  14  . PACEMAKER INSERTION  08, 1/14  . PACEMAKER REVISION N/A 03/07/2012   Procedure: PACEMAKER REVISION;  Surgeon: Sanda Klein, MD;  Location: [place] CATH LAB;  Service: Cardiovascular;  Laterality: N/A;  . POSTERIOR FUSION LUMBAR SPINE  08/28/2012   Dr Ronnald Ramp  . SHOULDER ARTHROSCOPY WITH OPEN ROTATOR CUFF REPAIR Right 03/30/2014   Procedure: RIGHT SHOULDER ARTHROSCOPY  OPEN ROTATOR CUFF REPAIR;  Surgeon: Kerin Salen, MD;  Location: [place];  Service: Orthopedics;  Laterality: Right;  . SHOULDER OPEN ROTATOR CUFF REPAIR Right 03/30/2014   Procedure: ROTATOR CUFF REPAIR SHOULDER OPEN;  Surgeon: Kerin Salen, MD;  Location: [place];  Service: Orthopedics;  Laterality: Right;  . STENTS    . TONSILLECTOMY       Current Meds  Medication Sig  . amLODipine (NORVASC) 10 MG tablet Take 10 mg by mouth daily.   [name] Kitchen apixaban (ELIQUIS) 5 MG TABS tablet Take 1 tablet (5 mg total) by mouth 2 (two) times daily.  . carboxymethylcellulose (REFRESH PLUS) 0.5 % SOLN Place 1 drop into both eyes every morning.  . carvedilol (COREG) 25 MG tablet TAKE 2 TABLETS BY MOUTH  TWICE DAILY (Patient taking differently: Take 50 mg by mouth 2 (two) times daily with a meal. )  . colchicine 0.6 MG tablet Take 0.6 mg by mouth 2 (two) times daily.   . diclofenac sodium (VOLTAREN) 1 % GEL Apply 2 g topically daily as needed (pain).   . ferrous sulfate 325 (65 FE) MG tablet Take 325 mg by mouth daily with breakfast.   . fluticasone (FLONASE) 50 MCG/ACT nasal spray Place 2 sprays into both nostrils daily as needed for allergies.   [name] Kitchen glipiZIDE (GLUCOTROL) 10 MG tablet Take 10 mg by mouth 2 (two) times daily.   . hydrochlorothiazide (HYDRODIURIL) 25 MG tablet Take 25 mg by mouth daily.  . Lancets (FREESTYLE) lancets Use to check blood sugar 4 time(s) daily.  [name] Kitchen losartan (COZAAR) 100 MG tablet Take 100 mg by mouth daily.  . metFORMIN (GLUCOPHAGE) 500 MG tablet Take 500 mg by mouth 4 (four) times daily.   . methocarbamol (ROBAXIN) 500 MG  tablet Take 1 tablet (500 mg total) by mouth every 6 (six) hours as needed for muscle spasms.  [name] Kitchen oxyCODONE (OXY IR/ROXICODONE) 5 MG immediate release tablet Take 1 tablet (5 mg total) by mouth every 4 (four) hours as needed for moderate pain ((score 4 to 6)).     Allergies:   Patient has no known allergies.   Social History   Tobacco Use  . Smoking status: Never Smoker  . Smokeless tobacco: Never Used  Substance Use Topics  . Alcohol use: Yes    Comment: rarely  . Drug use: No     Family Hx: The patient's family history includes Cancer in his mother; Other in his father.  ROS:   Please see the history of present illness.    Left shoulder pain All other systems reviewed and are negative.   Prior CV studies:   The following studies were reviewed today:  Comprehensive pacemaker download from June 3  Labs/Other Tests and Data Reviewed:    EKG:  Intracardiac electrogram shows atrial sensed (atrial fibrillation/flutter), ventricular sensed rhythm  Recent Labs: 03/05/2018: BUN 34; Creatinine, Ser 1.52; Potassium 4.6; Sodium 136 03/12/2018: Hemoglobin 8.3; Platelets 204   Recent Lipid Panel Lab Results  Component Value Date/Time   CHOL 134 12/20/2010 09:03 AM   TRIG 104 12/20/2010 09:03 AM   HDL 38 (L) 12/20/2010 09:03 AM   CHOLHDL 3.5 12/20/2010 09:03 AM   LDLCALC 75 12/20/2010 09:03 AM    Wt Readings from Last 3 Encounters:  07/11/18 208 lb (94.3 kg)  03/05/18 208 lb 8 oz (94.6 kg)  02/26/18 203 lb 6.4 oz (92.3 kg)     Objective:    Vital Signs:  BP 127/64 (BP Location: Left Arm)   Pulse 68   Ht 5\' 10"  (1.778 m)   Wt 208 lb (94.3 kg)   BMI 29.84 kg/m    VITAL SIGNS:  reviewed Unable to examine  ASSESSMENT & PLAN:    1. AFib:  He is symptomatic and will benefit from cardioversion.It is possible that this is atrial flutter and might be amenable to overdrive pacing.  The way he first needs to be anticoagulated for a minimum of 3 weeks.  Prefer Eliquis due to  his history of GI bleeding.  Although his creatinine is abnormal, he is still under the age of 50 and is not bleeding.  Will use the full 5 mg twice daily dose.  May elect to stop it again to reduce the risk of recurrent GI bleeding,  but only after at least 30 days of anticoagulation following cardioversion. This procedure has been fully reviewed with the patient and informed consent has been obtained. CHADSVasc score 4 (Age, vascular disease, diabetes mellitus). Note moderate left atrial dilation previous echo, may have to discuss antiarrhythmic therapy.  Multaq, sotalol or dofetilide may be good choices for him 2. CAD: No change in his usual pattern of exertional angina, on beta-blocker and amlodipine.  Ischemic evaluation does not appear to be necessary at this time. 3. NSVT: None seen recently.  Normal left ventricular systolic function only assessed by echo in September 2017.   4. SSS: He has sinus node arrest and is "atrially dependent". 5. PM: Normal device function.  Continue remote downloads every 3 months and yearly office visits. 6. Obesity/DM: He has made good progress with weight loss this year.  Most recent hemoglobin A1c showed that glucose is well controlled. 7. Shoulder surgery: This will have to be postponed for at least 30 days following his cardioversion, since he will not be able to stop anticoagulants.  Since the surgery is on his left shoulder, his pacemaker will have to be reprogrammed to a non-sensing mode before surgery.  COVID-19 Education: The signs and symptoms of COVID-19 were discussed with the patient and how to seek care for testing (follow up with PCP or arrange E-visit).  The importance of social distancing was discussed today.  Time:   Today, I have spent 25 minutes with the patient with telehealth technology discussing the above problems.     Medication Adjustments/Labs and Tests Ordered: Current medicines are reviewed at length with the patient today.  Concerns  regarding medicines are outlined above.   Tests Ordered: No orders of the defined types were placed in this encounter.   Medication Changes: Meds ordered this encounter  Medications  . apixaban (ELIQUIS) 5 MG TABS tablet    Sig: Take 1 tablet (5 mg total) by mouth 2 (two) times daily.    Dispense:  60 tablet    Refill:  3    Disposition:  Follow up Cardioversion, tentatively scheduled for Tuesday, June 30  Signed, Sanda Klein, MD  07/11/2018 9:40 AM    [place]

## 2018-07-11 NOTE — Telephone Encounter (Signed)
Medication Samples have been provided to the patient.  Drug name: Eliquis       Strength: 5mg       Qty: 56 tabs  LOT: [id number] Exp.Date: SEPT/2022  *SAMPLES ALREADY @ FRONT DESK*

## 2018-07-11 NOTE — Telephone Encounter (Signed)
We only need him on Eliquis for 2 months, one before and one after cardioversion. I would not want to do warfarin - more trouble than it is worth and will delay treatment for a month while we wait for it to be therapeutic. Do we have samples for half the period maybe?

## 2018-07-12 ENCOUNTER — Encounter: Payer: Self-pay | Admitting: *Deleted

## 2018-07-12 NOTE — Telephone Encounter (Signed)
Left a message for the patient to call back to go over the cardioversion instructions.

## 2018-07-12 NOTE — Addendum Note (Signed)
Addended by: Ricci Barker on: 07/12/2018 01:45 PM   Modules accepted: Orders

## 2018-07-17 ENCOUNTER — Other Ambulatory Visit: Payer: Self-pay | Admitting: Cardiovascular Disease

## 2018-07-18 ENCOUNTER — Encounter: Payer: Self-pay | Admitting: Cardiology

## 2018-07-18 NOTE — Progress Notes (Signed)
Remote pacemaker transmission.   

## 2018-07-22 ENCOUNTER — Telehealth: Payer: Self-pay | Admitting: Cardiovascular Disease

## 2018-07-22 NOTE — Telephone Encounter (Signed)
LMTCB

## 2018-07-22 NOTE — Telephone Encounter (Signed)
Follow up  ° ° °Pt is returning call  ° ° °Please call back  °

## 2018-07-22 NOTE — Telephone Encounter (Signed)
New message:    Patient stating that he is having AFIB and would like to speak with his nurse> I told the patient I would have a triage nurse to call. Please call patient.

## 2018-07-23 NOTE — Telephone Encounter (Signed)
LMTCB.... Cardioversion 08/06/18

## 2018-07-24 NOTE — Telephone Encounter (Signed)
Call placed to the patient. He stated that he has been checking his rhythm on a daily basis and does not feel like has been in atrial fibrillation. He has requested an EKG be completed to see if he is in atrial fib. He has a cardioversion scheduled for 08/06/2018.  An appointment for a nurse visit EKG is scheduled for 6/18 at 9 am. He is waiting to have shoulder surgery and cannot if he is in atrial fibrillation. He would like to go ahead and get this resolved so he can have the surgery.

## 2018-07-24 NOTE — Telephone Encounter (Signed)
Please see other note that has been opened for this patient.

## 2018-07-25 ENCOUNTER — Other Ambulatory Visit: Payer: Self-pay

## 2018-07-25 ENCOUNTER — Ambulatory Visit (INDEPENDENT_AMBULATORY_CARE_PROVIDER_SITE_OTHER): Payer: Medicare HMO

## 2018-07-25 VITALS — BP 150/70 | HR 71 | Ht 70.0 in | Wt 208.0 lb

## 2018-07-25 DIAGNOSIS — I4819 Other persistent atrial fibrillation: Secondary | ICD-10-CM | POA: Diagnosis not present

## 2018-07-25 NOTE — Patient Instructions (Signed)
1.) Reason for visit: EKG  2.) Name of MD requesting visit: Dr.[name]  3.) H&P: Scheduled for cardioversion 08/06/18  4.)   ROS related to problem: Patient feels like heart back in rhythm  5.) Assessment and plan per MD: EKG was shown to DOD Dr.Acharya she advised Pacer Rep needs to review. EKG scanned in chart for Dr.[name] to review.

## 2018-07-26 ENCOUNTER — Other Ambulatory Visit: Payer: Self-pay | Admitting: Physician Assistant

## 2018-07-26 NOTE — Telephone Encounter (Signed)
ECG reviewed - I believe he is still in AFib. Rather than doing an ECG, it is more helpful to do a pacemaker download - this will tell us more accurately what rhythm he is in.

## 2018-07-29 ENCOUNTER — Telehealth: Payer: Self-pay

## 2018-07-29 NOTE — Telephone Encounter (Signed)
Pt states he will send his transmission first thing in the morning.

## 2018-07-29 NOTE — Telephone Encounter (Signed)
LMOVM for pt to send a manual transmission with his home monitor.

## 2018-07-29 NOTE — Telephone Encounter (Signed)
Left message for patient to send download. Device clinic aware to look for results.

## 2018-07-30 ENCOUNTER — Telehealth: Payer: Self-pay | Admitting: Cardiovascular Disease

## 2018-07-30 NOTE — Telephone Encounter (Signed)
Left message to call back  

## 2018-07-30 NOTE — Telephone Encounter (Signed)
New Message   Patient is calling because he did send the remote transmission on yesterday. He is wanting to know will he still be having is cardioversion. Please call. He will be available after 1:45pm

## 2018-07-30 NOTE — Telephone Encounter (Signed)
Spoke with patient and advised that download not viewable at this time. Message sent to device clinic to see about download being scanned in chart.

## 2018-07-30 NOTE — Telephone Encounter (Addendum)
LMOVM requesting call back to DC. Gave direct number for return call.  Transmission reviewed. Persistent AF since 06/09/18.

## 2018-07-30 NOTE — Telephone Encounter (Signed)
Follow up  ° ° °Pt is returning call  ° ° °Please call back  °

## 2018-07-30 NOTE — Telephone Encounter (Signed)
Transmission reviewed. Persistent AF since 06/09/18. Attempted to reach pt to discuss results (also documented in phone note from 07/29/18).  Non-magnet EGM:

## 2018-07-30 NOTE — Telephone Encounter (Signed)
New message:    Patient returning a call back. Please call patient.

## 2018-07-30 NOTE — Telephone Encounter (Signed)
Transmission received 07-29-2018

## 2018-07-31 NOTE — Telephone Encounter (Signed)
Advised patient, verbalized understanding  

## 2018-07-31 NOTE — Telephone Encounter (Signed)
Follow up ° ° °Patient is returning your call. Please call. ° ° ° °

## 2018-08-01 ENCOUNTER — Other Ambulatory Visit: Payer: Self-pay

## 2018-08-01 ENCOUNTER — Other Ambulatory Visit (HOSPITAL_COMMUNITY): Payer: Medicare HMO

## 2018-08-01 DIAGNOSIS — I10 Essential (primary) hypertension: Secondary | ICD-10-CM

## 2018-08-01 DIAGNOSIS — I4819 Other persistent atrial fibrillation: Secondary | ICD-10-CM

## 2018-08-01 LAB — BASIC METABOLIC PANEL
BUN/Creatinine Ratio: 19 (ref 10–24)
BUN: 30 mg/dL — ABNORMAL HIGH (ref 8–27)
CO2: 24 mmol/L (ref 20–29)
Calcium: 10 mg/dL (ref 8.6–10.2)
Chloride: 102 mmol/L (ref 96–106)
Creatinine, Ser: 1.55 mg/dL — ABNORMAL HIGH (ref 0.76–1.27)
GFR calc Af Amer: 50 mL/min/{1.73_m2} — ABNORMAL LOW (ref >59)
GFR calc non Af Amer: 43 mL/min/{1.73_m2} — ABNORMAL LOW (ref >59)
Glucose: 113 mg/dL — ABNORMAL HIGH (ref 65–99)
Potassium: 4.9 mmol/L (ref 3.5–5.2)
Sodium: 139 mmol/L (ref 134–144)

## 2018-08-01 LAB — CBC
Hematocrit: 34.2 % — ABNORMAL LOW (ref 37.5–51.0)
Hemoglobin: 11.1 g/dL — ABNORMAL LOW (ref 13.0–17.7)
MCH: 29.3 pg (ref 26.6–33.0)
MCHC: 32.5 g/dL (ref 31.5–35.7)
MCV: 90 fL (ref 79–97)
Platelets: 159 10*3/uL (ref 150–450)
RBC: 3.79 x10E6/uL — ABNORMAL LOW (ref 4.14–5.80)
RDW: 14 % (ref 11.6–15.4)
WBC: 5.4 10*3/uL (ref 3.4–10.8)

## 2018-08-01 NOTE — Telephone Encounter (Signed)
Per phone note from 07/30/18, Alvina Filbert, LPN, made patient aware of findings.

## 2018-08-02 ENCOUNTER — Other Ambulatory Visit (HOSPITAL_COMMUNITY)
Admission: RE | Admit: 2018-08-02 | Discharge: 2018-08-02 | Disposition: A | Discharge status: Home / Self Care | Payer: Medicare HMO | Source: Ambulatory Visit | Attending: Cardiovascular Disease | Admitting: Cardiovascular Disease

## 2018-08-02 DIAGNOSIS — Z1159 Encounter for screening for other viral diseases: Secondary | ICD-10-CM | POA: Insufficient documentation

## 2018-08-02 LAB — SARS CORONAVIRUS 2 (TAT 6-24 HRS): SARS Coronavirus 2: NEGATIVE

## 2018-08-06 ENCOUNTER — Telehealth: Payer: Self-pay | Admitting: *Deleted

## 2018-08-06 ENCOUNTER — Telehealth: Payer: Self-pay

## 2018-08-06 ENCOUNTER — Ambulatory Visit (HOSPITAL_COMMUNITY): Payer: Medicare HMO | Admitting: Anesthesiology

## 2018-08-06 ENCOUNTER — Encounter (HOSPITAL_COMMUNITY): Payer: Self-pay | Admitting: Certified Registered"

## 2018-08-06 ENCOUNTER — Encounter (HOSPITAL_COMMUNITY)
Admission: RE | Disposition: A | Discharge status: Home / Self Care | Payer: Self-pay | Source: Home / Self Care | Attending: Cardiovascular Disease

## 2018-08-06 ENCOUNTER — Ambulatory Visit (HOSPITAL_COMMUNITY)
Admission: RE | Admit: 2018-08-06 | Discharge: 2018-08-06 | Disposition: A | Discharge status: Home / Self Care | Payer: Medicare HMO | Attending: Cardiovascular Disease | Admitting: Cardiovascular Disease

## 2018-08-06 DIAGNOSIS — I495 Sick sinus syndrome: Secondary | ICD-10-CM | POA: Insufficient documentation

## 2018-08-06 DIAGNOSIS — I2510 Atherosclerotic heart disease of native coronary artery without angina pectoris: Secondary | ICD-10-CM | POA: Insufficient documentation

## 2018-08-06 DIAGNOSIS — Z791 Long term (current) use of non-steroidal anti-inflammatories (NSAID): Secondary | ICD-10-CM | POA: Insufficient documentation

## 2018-08-06 DIAGNOSIS — M19012 Primary osteoarthritis, left shoulder: Secondary | ICD-10-CM | POA: Insufficient documentation

## 2018-08-06 DIAGNOSIS — Z7951 Long term (current) use of inhaled steroids: Secondary | ICD-10-CM | POA: Insufficient documentation

## 2018-08-06 DIAGNOSIS — I480 Paroxysmal atrial fibrillation: Secondary | ICD-10-CM | POA: Diagnosis present

## 2018-08-06 DIAGNOSIS — Z951 Presence of aortocoronary bypass graft: Secondary | ICD-10-CM | POA: Insufficient documentation

## 2018-08-06 DIAGNOSIS — I484 Atypical atrial flutter: Secondary | ICD-10-CM | POA: Diagnosis not present

## 2018-08-06 DIAGNOSIS — Z7984 Long term (current) use of oral hypoglycemic drugs: Secondary | ICD-10-CM | POA: Insufficient documentation

## 2018-08-06 DIAGNOSIS — Z6829 Body mass index (BMI) 29.0-29.9, adult: Secondary | ICD-10-CM | POA: Insufficient documentation

## 2018-08-06 DIAGNOSIS — G4733 Obstructive sleep apnea (adult) (pediatric): Secondary | ICD-10-CM | POA: Diagnosis not present

## 2018-08-06 DIAGNOSIS — Z950 Presence of cardiac pacemaker: Secondary | ICD-10-CM | POA: Diagnosis not present

## 2018-08-06 DIAGNOSIS — Z7901 Long term (current) use of anticoagulants: Secondary | ICD-10-CM | POA: Diagnosis not present

## 2018-08-06 DIAGNOSIS — I129 Hypertensive chronic kidney disease with stage 1 through stage 4 chronic kidney disease, or unspecified chronic kidney disease: Secondary | ICD-10-CM | POA: Diagnosis not present

## 2018-08-06 DIAGNOSIS — E1122 Type 2 diabetes mellitus with diabetic chronic kidney disease: Secondary | ICD-10-CM | POA: Insufficient documentation

## 2018-08-06 DIAGNOSIS — Z79899 Other long term (current) drug therapy: Secondary | ICD-10-CM | POA: Insufficient documentation

## 2018-08-06 DIAGNOSIS — N183 Chronic kidney disease, stage 3 (moderate): Secondary | ICD-10-CM | POA: Diagnosis not present

## 2018-08-06 DIAGNOSIS — E669 Obesity, unspecified: Secondary | ICD-10-CM | POA: Diagnosis not present

## 2018-08-06 DIAGNOSIS — E782 Mixed hyperlipidemia: Secondary | ICD-10-CM | POA: Insufficient documentation

## 2018-08-06 SURGERY — CANCELLED PROCEDURE

## 2018-08-06 NOTE — Interval H&P Note (Signed)
History and Physical Interval Note:  08/06/2018 2:19 PM  [name]  has presented today for surgery, with the diagnosis of atrial fibrillation.  The various methods of treatment have been discussed with the patient and family. After consideration of risks, benefits and other options for treatment, the patient has consented to  Procedure(s): CANCELLED [place] as a surgical intervention.  The patient's history has been reviewed, patient examined, no change in status, stable for surgery.  I have reviewed the patient's chart and labs.  Questions were answered to the patient's satisfaction.     Garik Diamant

## 2018-08-06 NOTE — Telephone Encounter (Signed)
Medication Samples have been provided to the patient.  Drug name: Eliquis       Strength: 5 mg        Qty: 1 box  LOT: [id number] Exp.Date: 10/2020

## 2018-08-06 NOTE — Procedures (Signed)
On presentation the patient rhythm was Atrial flutter (CL 300 ms) with high grade AV block and V-sensed/intermittent V-paced. Persistent arrhythmia for > 2 months.  Overdrive pacing was attempted at cycle lengths from 300 ms, gradually decreased to 162 ms, at which point the rhythm converted to A paced-V paced at 60 bpm. After reprogramming to AAIR-DDDR, the rhythm was A paced, V sensed. Normal pacemaker generator and lead testing.  Electrical cardioversion was canceled. The patient was instructed to continue anticoagulation for a minimum of another 30 days.  Sanda Klein, MD, [facility] [phone] office [phone] pager

## 2018-08-06 NOTE — Telephone Encounter (Signed)
Spoke to pt to let him know that Dr. Sallyanne Kuster wants him to send a transmission on 09/05/18. Pt verbalizes understanding.

## 2018-08-06 NOTE — Anesthesia Preprocedure Evaluation (Deleted)
Anesthesia Evaluation  Patient identified by MRN, date of birth, ID band Patient awake    Reviewed: Allergy & Precautions, H&P , NPO status , Patient's Chart, lab work & pertinent test results, reviewed documented beta blocker date and time   Airway        Dental no notable dental hx.    Pulmonary sleep apnea ,    Pulmonary exam normal        Cardiovascular hypertension, Pt. on medications and Pt. on home beta blockers + CAD  + dysrhythmias Atrial Fibrillation + pacemaker      Neuro/Psych negative neurological ROS  negative psych ROS   GI/Hepatic negative GI ROS, Neg liver ROS,   Endo/Other  diabetes, Type 2, Oral Hypoglycemic Agents  Renal/GU negative Renal ROS  negative genitourinary   Musculoskeletal  (+) Arthritis , Osteoarthritis,    Abdominal   Peds  Hematology  (+) Blood dyscrasia, anemia ,   Anesthesia Other Findings   Reproductive/Obstetrics negative OB ROS                             Anesthesia Physical Anesthesia Plan  ASA: III  Anesthesia Plan: General   Post-op Pain Management:    Induction: Intravenous  PONV Risk Score and Plan: 2 and Treatment may vary due to age or medical condition  Airway Management Planned: Mask  Additional Equipment:   Intra-op Plan:   Post-operative Plan:   Informed Consent: I have reviewed the patients History and Physical, chart, labs and discussed the procedure including the risks, benefits and alternatives for the proposed anesthesia with the patient or authorized representative who has indicated his/her understanding and acceptance.     Dental advisory given  Plan Discussed with: CRNA  Anesthesia Plan Comments:         Anesthesia Quick Evaluation

## 2018-08-06 NOTE — Progress Notes (Signed)
Patient presented for direct current cardioversion.  Patient found to be in atrial flutter.  Seen by Dr. Sanda Klein.  He was paced out of the flutter back to a paced rhythm.  Procedure was cancelled and patient sent home with directions to follow up in office.

## 2018-08-07 NOTE — Op Note (Signed)
Presenting rhythm was atrial flutter (atypical), with a cycle length of 295-300 ms. Overdrive pacing was performed via the dual chamber device. Gradually decreasing CL burst pacing was performed, starting at 300 ms, with successful termination after burst pacing at 162 ms. The emergent rhythm was A paced- V paced, with return to A paced, V sensed after reprogramming back to AAIR-DDDR. Normal device function.  Plan remote download in a week to see if the benefit is durable.  The patient was cautioned to continue uninterrupted anticoagulation for at least 30 days after this procedure. Cardioversion cancelled.

## 2018-08-23 ENCOUNTER — Telehealth: Payer: Self-pay | Admitting: Physician Assistant

## 2018-08-23 NOTE — Telephone Encounter (Signed)
I called pt to confirm his appt on 08-26-18.        COVID-19 Pre-Screening Questions:   In the past 7 to 10 days have you had a cough,  shortness of breath, headache, congestion, fever (100 or greater) body aches, chills, sore throat, or sudden loss of taste or sense of smell? no  Have you been around anyone with known Covid 19.  Have you been around anyone who is awaiting Covid 19 test results in the past 7 to 10 days? no Have you been around anyone who has been exposed to Covid 19, or has mentioned symptoms of Covid 19 within the past 7 to 10 days? noIf you have any concerns/questions about symptoms patients report during screening (either on the phone or at threshold). Contact the provider seeing the patient or DOD for further guidance.  If neither are available contact a member of the leadership team.

## 2018-08-26 ENCOUNTER — Ambulatory Visit: Payer: Medicare HMO | Admitting: Physician Assistant

## 2018-08-26 ENCOUNTER — Other Ambulatory Visit: Payer: Self-pay

## 2018-08-26 ENCOUNTER — Encounter: Payer: Self-pay | Admitting: Physician Assistant

## 2018-08-26 VITALS — BP 133/66 | HR 76 | Temp 98.4°F | Ht 69.5 in | Wt 221.4 lb

## 2018-08-26 DIAGNOSIS — N183 Chronic kidney disease, stage 3 unspecified: Secondary | ICD-10-CM

## 2018-08-26 DIAGNOSIS — G4733 Obstructive sleep apnea (adult) (pediatric): Secondary | ICD-10-CM

## 2018-08-26 DIAGNOSIS — Z9989 Dependence on other enabling machines and devices: Secondary | ICD-10-CM

## 2018-08-26 DIAGNOSIS — G14 Postpolio syndrome: Secondary | ICD-10-CM

## 2018-08-26 DIAGNOSIS — Z950 Presence of cardiac pacemaker: Secondary | ICD-10-CM

## 2018-08-26 DIAGNOSIS — I25718 Atherosclerosis of autologous vein coronary artery bypass graft(s) with other forms of angina pectoris: Secondary | ICD-10-CM

## 2018-08-26 DIAGNOSIS — Z01810 Encounter for preprocedural cardiovascular examination: Secondary | ICD-10-CM

## 2018-08-26 DIAGNOSIS — E782 Mixed hyperlipidemia: Secondary | ICD-10-CM

## 2018-08-26 DIAGNOSIS — I480 Paroxysmal atrial fibrillation: Secondary | ICD-10-CM | POA: Diagnosis not present

## 2018-08-26 DIAGNOSIS — E119 Type 2 diabetes mellitus without complications: Secondary | ICD-10-CM | POA: Diagnosis not present

## 2018-08-26 NOTE — Patient Instructions (Addendum)
Medication Instructions:  Your physician recommends that you continue on your current medications as directed. Please refer to the Current Medication list given to you today.  If you need a refill on your cardiac medications before your next appointment, please call your pharmacy.   Lab work: NONE ordered at this time of appointment   If you have labs (blood work) drawn today and your tests are completely normal, you will receive your results only by: [name] Kitchen MyChart Message (if you have MyChart) OR . A paper copy in the mail If you have any lab test that is abnormal or we need to change your treatment, we will call you to review the results.  Testing/Procedures: NONE ordered at this time of appointment   Follow-Up: At [facility], you and your health needs are our priority.  As part of our continuing mission to provide you with exceptional heart care, we have created designated Provider Care Teams.  These Care Teams include your primary Cardiologist (physician) and Advanced Practice Providers (APPs -  Physician Assistants and Nurse Practitioners) who all work together to provide you with the care you need, when you need it. You will need a follow up appointment in 2-3 months.  Please call our office 2 months in advance to schedule this appointment.  You may see Sanda Klein, MD or one of the following Advanced Practice Providers on your designated Care Team: [place], [place] . Fabian Sharp, PA-C  Any Other Special Instructions Will Be Listed Below (If Applicable). Medication Samples have been provided to the patient.  Drug name: Eliquis      Strength: 5 mg        Qty: 28 tablets (2 boxes) LOT: [id number]  Exp.Date: APR 2022  Dosing instructions: Take 1 tablet 2 times a daily  The patient has been instructed regarding the correct time, dose, and frequency of taking this medication, including desired effects and most common side effects.   Alan [name] 11:02 AM 08/26/2018

## 2018-08-26 NOTE — Progress Notes (Signed)
Cardiology Office Note    Date:  08/28/2018   ID:  CARRY ORTEZ, DOB 06-03-1940, MRN [id number]  PCP:  [name], MD  Cardiologist:  Dr. Sallyanne Kuster   Chief Complaint  Patient presents with   Follow-up    seen for Dr. Sallyanne Kuster    History of Present Illness:  Alan [name] is a 78 y.o. male with past medical history of CAD s/p redo CABG 2004, sinus node dysfunction/arrest s/p dual-chamber PPM, PAF, DM 2, obstructive sleep apnea on CPAP, hyperlipidemia, CKD stage III, and post polio syndrome.  Patient was recently seen via virtual visit by Dr. Sallyanne Kuster on 08/10/2018, he was getting ready to schedule his left shoulder surgery, however has been noticing some reduced energy in the past several weeks.  Device interrogation revealed that he has been in persistent atrial fibrillation since May 3rd.  He underwent successful overdrive pacing on 6/64/4034 which converted him out of atrial fibrillation.  He was instructed to continue anticoagulation uninterrupted for at least 30 days prior to his shoulder surgery.  Patient presents today for post cardioversion visit.  He says for the first 2 days after discharge, he felt very energetic and was having a hard time slowing down.  Since then he has noticed that his energy level has went back to the previous level.  He is due for a remote transmission on 7/30, if he is still in sinus rhythm at that time, he can arrange for the shoulder surgery after holding Eliquis for 2 days.  He did not have any chest pain.  Although he has some lower extremity edema, he says he usually have lower extremity edema that come and go. I asked him to try to treat lower extremity edema by using leg elevation.  He says later he may ended up having back surgery as well, if he does ended up having back surgery, he may need to hold the Eliquis for 3 days prior to a spinal procedure.  We did discuss the need to stay on Eliquis long-term given his CHA2DS2-Vasc score 4 (CAD, age >7,  DM II).  Otherwise he can follow-up in 2 to 3 months.  Past Medical History:  Diagnosis Date   A-fib [facility])    Anticoagulant long-term use, on coumadin for PAF 03/08/2012   Chronic back pain    Complete heart block ([place])    Coronary artery disease    seen at [facility] cardiology    Diabetes mellitus    History of kidney stones    Hx of echocardiogram 06/29/2008   EF 45-50%    Hypertension    Dr. Veronia Beets, primary   Neuromuscular disorder [facility])    carpal tunnel bilaterally   Pacemaker 03/07/2012   replaced   Polio    hx of   Sleep apnea    cpap, 11, last sleep study Jan2013, sees Dr. Keturah Barre   Status post placement of cardiac pacemaker, 03/07/12 New Medtronic Gen and new Vent lead original placed 2008 03/08/2012    Past Surgical History:  Procedure Laterality Date   [place]   CORONARY ARTERY BYPASS GRAFT  2004   ESOPHAGOGASTRODUODENOSCOPY Left 03/18/2013   Procedure: ESOPHAGOGASTRODUODENOSCOPY (EGD);  Surgeon: Cleotis Nipper, MD;  Location: [facility] ENDOSCOPY;  Service: Endoscopy;  Laterality: Left;   EYE SURGERY Bilateral  cataract surgery with lens implants   FRACTURE SURGERY     Hx: of right leg   HARDWARE REMOVAL N/A 05/18/2016   Procedure: Removal of broken hardware Lumbar three-four;  Surgeon: [name], MD;  Location: [place];  Service: Neurosurgery;  Laterality: N/A;   HERNIA REPAIR     INSERT / REPLACE / REMOVE PACEMAKER  03/07/2012   replaced   JOINT REPLACEMENT     Hx: of left knee   LEFT HEART CATHETERIZATION WITH CORONARY/GRAFT ANGIOGRAM N/A 12/21/2010   Procedure: LEFT HEART CATHETERIZATION WITH Beatrix Fetters;  Surgeon: Leonie Man, MD;  Location: [facility] CATH LAB;  Service: Cardiovascular;  Laterality: N/A;   LEG SURGERY Right    lengthened his leg   LUMBAR LAMINECTOMY/DECOMPRESSION  MICRODISCECTOMY  01/10/2012   Procedure: LUMBAR LAMINECTOMY/DECOMPRESSION MICRODISCECTOMY 1 LEVEL;  Surgeon: [name], MD;  Location: [place] NEURO ORS;  Service: Neurosurgery;  Laterality: Bilateral;  Lumbar three-four decompression, Posterior lateral fusion lumbar three-four, posterior spinus plate lumbar three-four   LUMBAR LAMINECTOMY/DECOMPRESSION MICRODISCECTOMY Left 05/18/2016   Procedure: Laminectomy and Foraminotomy - Lumbar two-lumbar three- Lumbar four-lumbar five- left with removal hardware lumbar three-four;  Surgeon: [name], MD;  Location: [place];  Service: Neurosurgery;  Laterality: Left;   MAZE Procedure  2004   NASAL SEPTUM SURGERY N/A 14   PACEMAKER INSERTION  08, 1/14   PACEMAKER REVISION N/A 03/07/2012   Procedure: PACEMAKER REVISION;  Surgeon: Sanda Klein, MD;  Location: [place] CATH LAB;  Service: Cardiovascular;  Laterality: N/A;   POSTERIOR FUSION LUMBAR SPINE  08/28/2012   Dr Ronnald Ramp   SHOULDER ARTHROSCOPY WITH OPEN ROTATOR CUFF REPAIR Right 03/30/2014   Procedure: RIGHT SHOULDER ARTHROSCOPY  OPEN ROTATOR CUFF REPAIR;  Surgeon: Kerin Salen, MD;  Location: [place];  Service: Orthopedics;  Laterality: Right;   SHOULDER OPEN ROTATOR CUFF REPAIR Right 03/30/2014   Procedure: ROTATOR CUFF REPAIR SHOULDER OPEN;  Surgeon: Kerin Salen, MD;  Location: [place];  Service: Orthopedics;  Laterality: Right;   STENTS     TONSILLECTOMY      Current Medications: Outpatient Medications Prior to Visit  Medication Sig Dispense Refill   amLODipine (NORVASC) 10 MG tablet Take 10 mg by mouth daily.      apixaban (ELIQUIS) 5 MG TABS tablet Take 1 tablet (5 mg total) by mouth 2 (two) times daily. 60 tablet 3   Ascorbic Acid (VITAMIN C PO) Take 1 tablet by mouth daily.     CALCIUM PO Take 1 tablet by mouth 2 (two) times a day.     carboxymethylcellulose (REFRESH PLUS) 0.5 % SOLN Place 1 drop into both eyes every morning.     carvedilol (COREG) 25 MG tablet Take 2 tablets by  mouth twice daily 360 tablet 3   diclofenac sodium (VOLTAREN) 1 % GEL Apply 2 g topically daily as needed (pain).      ferrous sulfate 325 (65 FE) MG tablet Take 325 mg by mouth 2 (two) times a day.      fluticasone (FLONASE) 50 MCG/ACT nasal spray Place 2 sprays into both nostrils daily as needed for allergies.      glipiZIDE (GLUCOTROL) 10 MG tablet Take 10 mg by mouth 2 (two) times daily.      hydrochlorothiazide (HYDRODIURIL) 25 MG tablet Take 25 mg by mouth daily.     Lancets (FREESTYLE) lancets Use to check blood sugar 4 time(s) daily.     losartan (COZAAR) 100 MG tablet Take 100 mg by mouth  daily.     metFORMIN (GLUCOPHAGE) 500 MG tablet Take 500 mg by mouth 2 (two) times a day.      metFORMIN (GLUCOPHAGE-XR) 500 MG 24 hr tablet Take 500 mg by mouth 2 (two) times a day.     methocarbamol (ROBAXIN) 500 MG tablet Take 1 tablet (500 mg total) by mouth every 6 (six) hours as needed for muscle spasms. 60 tablet 1   Multiple Vitamin (MULTIVITAMIN WITH MINERALS) TABS tablet Take 1 tablet by mouth daily.     oxyCODONE (OXY IR/ROXICODONE) 5 MG immediate release tablet Take 1 tablet (5 mg total) by mouth every 4 (four) hours as needed for moderate pain ((score 4 to 6)). 40 tablet 0   No facility-administered medications prior to visit.      Allergies:   Patient has no known allergies.   Social History   Socioeconomic History   Marital status: Single    Spouse name: Not on file   Number of children: 1   Years of education: 16   Highest education level: Not on file  Occupational History   Occupation: pilot-retired  Scientist, product/process development strain: Not on file   Food insecurity    Worry: Not on file    Inability: Not on file   Transportation needs    Medical: Not on file    Non-medical: Not on file  Tobacco Use   Smoking status: Never Smoker   Smokeless tobacco: Never Used  Substance and Sexual Activity   Alcohol use: Yes    Comment: rarely   Drug  use: No   Sexual activity: Yes  Lifestyle   Physical activity    Days per week: Not on file    Minutes per session: Not on file   Stress: Not on file  Relationships   Social connections    Talks on phone: Not on file    Gets together: Not on file    Attends religious service: Not on file    Active member of club or organization: Not on file    Attends meetings of clubs or organizations: Not on file    Relationship status: Not on file  Other Topics Concern   Not on file  Social History Narrative   Lives on [address] [place] [facility], [place].    Right-handed.   1 cup caffeine per day.     Family History:  The patient's family history includes Cancer in his mother; Other in his father.   ROS:   Please see the history of present illness.    ROS All other systems reviewed and are negative.   PHYSICAL EXAM:   VS:  BP 133/66    Pulse 76    Temp 98.4 F (36.9 C) (Temporal)    Ht 5' 9.5" (1.765 m)    Wt 221 lb 6.4 oz (100.4 kg)    SpO2 98%    BMI 32.23 kg/m    GEN: Well nourished, well developed, in no acute distress  HEENT: normal  Neck: no JVD, carotid bruits, or masses Cardiac: RRR; no murmurs, rubs, or gallops,no edema  Respiratory:  clear to auscultation bilaterally, normal work of breathing GI: soft, nontender, nondistended, + BS MS: no deformity or atrophy  Skin: warm and dry, no rash Neuro:  Alert and Oriented x 3, Strength and sensation are intact Psych: euthymic mood, full affect  Wt Readings from Last 3 Encounters:  08/26/18 221 lb 6.4 oz (100.4 kg)  07/25/18 208 lb (  94.3 kg)  07/11/18 208 lb (94.3 kg)      Studies/Labs Reviewed:   EKG:  EKG is not ordered today.    Recent Labs: 08/01/2018: BUN 30; Creatinine, Ser 1.55; Hemoglobin 11.1; Platelets 159; Potassium 4.9; Sodium 139   Lipid Panel    Component Value Date/Time   CHOL 134 12/20/2010 0903   TRIG 104 12/20/2010 0903   HDL 38 (L) 12/20/2010 0903   CHOLHDL 3.5 12/20/2010 0903   VLDL 21  12/20/2010 0903   LDLCALC 75 12/20/2010 0903    Additional studies/ records that were reviewed today include:   Overdrive pacing 8/56/3149 Presenting rhythm was atrial flutter (atypical), with a cycle length of 295-300 ms. Overdrive pacing was performed via the dual chamber device. Gradually decreasing CL burst pacing was performed, starting at 300 ms, with successful termination after burst pacing at 162 ms. The emergent rhythm was A paced- V paced, with return to A paced, V sensed after reprogramming back to AAIR-DDDR. Normal device function.  Plan remote download in a week to see if the benefit is durable.  The patient was cautioned to continue uninterrupted anticoagulation for at least 30 days after this procedure. Cardioversion cancelled.   ASSESSMENT:    1. PAF (paroxysmal atrial fibrillation) ([place])   2. CAD s/p redo CABG 2004 with stable angina pectoris ([place])   3. Pacemaker   4. Controlled type 2 diabetes mellitus without complication, without long-term current use of insulin ([place])   5. OSA on CPAP   6. Mixed hyperlipidemia   7. Post-polio syndrome   8. CKD (chronic kidney disease), stage III ([place])   9. Preop cardiovascular exam      PLAN:  In order of problems listed above:  1. PAF: Recently underwent overdrive pacing and was paced out of atrial fibrillation.  He felt very energetic for the first 2 days, however his energy level has returned to the previous level.  He is due for upcoming remote transmission on 7/30 which is 30 days after his overdrive pacing.  If he is maintaining sinus rhythm at that time, he may proceed with shoulder surgery after holding Eliquis for 2 days.  If he went back into atrial fibrillation, may need to consider amiodarone therapy.  Continue Eliquis.  2. Preoperative clearance: Pending upcoming remote transmission.  If he is maintaining sinus rhythm, may hold Eliquis for 2 days prior to shoulder surgery.  He may ended up needing back surgery in  the future as well, he will need to hold Eliquis for 3 days prior to that procedure instead.  He should restart Eliquis as soon as possible after the procedures.  3. CAD s/p CABG: Denies any recent chest discomfort.  Not on aspirin given the need for Eliquis.   4. S/p pacemaker: Followed by Dr. Sallyanne Kuster  5. Hyperlipidemia: For some reason he is not on statin therapy.  Last lipid panel in the epic system dated back to 2012, annual lipid panel followed by primary care provider.  6. Post polio syndrome: No recent changes  7. CKD stage III: Renal function stable.    Medication Adjustments/Labs and Tests Ordered: Current medicines are reviewed at length with the patient today.  Concerns regarding medicines are outlined above.  Medication changes, Labs and Tests ordered today are listed in the Patient Instructions below. Patient Instructions  Medication Instructions:  Your physician recommends that you continue on your current medications as directed. Please refer to the Current Medication list given to you today.  If you  need a refill on your cardiac medications before your next appointment, please call your pharmacy.   Lab work: NONE ordered at this time of appointment   If you have labs (blood work) drawn today and your tests are completely normal, you will receive your results only by:  [place] (if you have MyChart) OR  A paper copy in the mail If you have any lab test that is abnormal or we need to change your treatment, we will call you to review the results.  Testing/Procedures: NONE ordered at this time of appointment   Follow-Up: At [facility], you and your health needs are our priority.  As part of our continuing mission to provide you with exceptional heart care, we have created designated Provider Care Teams.  These Care Teams include your primary Cardiologist (physician) and Advanced Practice Providers (APPs -  Physician Assistants and Nurse Practitioners) who  all work together to provide you with the care you need, when you need it. You will need a follow up appointment in 2-3 months.  Please call our office 2 months in advance to schedule this appointment.  You may see Sanda Klein, MD or one of the following Advanced Practice Providers on your designated Care Team: [name], [place]  Fabian Sharp, [place]  Any Other Special Instructions Will Be Listed Below (If Applicable). Medication Samples have been provided to the patient.  Drug name: Eliquis      Strength: 5 mg        Qty: 28 tablets (2 boxes) LOT: [id number]  Exp.Date: APR 2022  Dosing instructions: Take 1 tablet 2 times a daily  The patient has been instructed regarding the correct time, dose, and frequency of taking this medication, including desired effects and most common side effects.   Jacqulynn Cadet 11:02 AM 08/26/2018      Hilbert Corrigan, PA  08/28/2018 12:59 AM    [facility] [place], [place], [place]  [address] Phone: [phone]; Fax: [phone]

## 2018-08-28 ENCOUNTER — Other Ambulatory Visit: Payer: Self-pay | Admitting: Neurological Surgery

## 2018-08-28 ENCOUNTER — Encounter: Payer: Self-pay | Admitting: Physician Assistant

## 2018-08-28 DIAGNOSIS — M545 Low back pain, unspecified: Secondary | ICD-10-CM

## 2018-08-28 NOTE — Progress Notes (Signed)
If he is not feeling well, have him do the download now. I can look at it later today.

## 2018-09-05 ENCOUNTER — Telehealth: Payer: Self-pay

## 2018-09-05 ENCOUNTER — Ambulatory Visit (INDEPENDENT_AMBULATORY_CARE_PROVIDER_SITE_OTHER): Payer: Medicare HMO | Admitting: *Deleted

## 2018-09-05 DIAGNOSIS — Z950 Presence of cardiac pacemaker: Secondary | ICD-10-CM

## 2018-09-05 LAB — CUP PACEART REMOTE DEVICE CHECK
Battery Impedance: 354 Ohm
Battery Remaining Longevity: 100 mo
Battery Voltage: 2.79 V
Brady Statistic AP VP Percent: 1 %
Brady Statistic AP VS Percent: 91 %
Brady Statistic AS VP Percent: 0 %
Brady Statistic AS VS Percent: 8 %
Date Time Interrogation Session: 20200730094044
Implantable Lead Implant Date: 20080208
Implantable Lead Implant Date: 20140130
Implantable Lead Location: 753859
Implantable Lead Location: 753860
Implantable Lead Model: 5076
Implantable Lead Model: 5076
Implantable Pulse Generator Implant Date: 20140130
Lead Channel Impedance Value: 467 Ohm
Lead Channel Impedance Value: 500 Ohm
Lead Channel Pacing Threshold Amplitude: 0.75 V
Lead Channel Pacing Threshold Amplitude: 0.75 V
Lead Channel Pacing Threshold Pulse Width: 0.4 ms
Lead Channel Pacing Threshold Pulse Width: 0.4 ms
Lead Channel Sensing Intrinsic Amplitude: 5.6 mV
Lead Channel Setting Pacing Amplitude: 2 V
Lead Channel Setting Pacing Amplitude: 2.5 V
Lead Channel Setting Pacing Pulse Width: 0.4 ms
Lead Channel Setting Sensing Sensitivity: 2 mV

## 2018-09-05 NOTE — Telephone Encounter (Signed)
Pt called to ask if we receive his transmission and how does it look. Pt states he is trying to get to the point where he can get cleared to have shoulder surgery. I let him talk with the device tech nurse Jenny Reichmann, RN.

## 2018-09-06 NOTE — Telephone Encounter (Signed)
Late entry for  09/05/18 @ 3:37 pm: Patient given results of 09/05/18 remote. Explained he had 1 brief occurrence of NSVT. Patient wanted t know if he would be cleared for shoulder surgery. I informed patient that the doctor doing the surgery would make that decision. He expressed concerns  about ppm during procedure and was reassured that when surgeries occur the device clinic is notified and information given concerning PPM settings and care during surgery.

## 2018-09-09 ENCOUNTER — Telehealth: Payer: Self-pay | Admitting: *Deleted

## 2018-09-09 NOTE — Telephone Encounter (Signed)
-----   Message from Sanda Klein, MD sent at 09/08/2018  6:20 PM EDT ----- Remote reviewed.   Not pacemaker dependent. Battery status is good. Lead measurements are stable. Heart rate histogram is favorable.  He can go ahead and schedule his shoulder surgery. Will need to stop the Eliquis for at least 2 days before the procedure. Please ask if his surgeon needs another "clearance" letter.  Remains in normal rhythm since cardioversion. Rare nonsustained VT.

## 2018-09-09 NOTE — Telephone Encounter (Signed)
Left a message for the patient to call back.  

## 2018-09-10 NOTE — Telephone Encounter (Signed)
New message   Patient is returning your call. Please call.

## 2018-09-10 NOTE — Telephone Encounter (Signed)
Called patient, advised of note from Dr.[name]. Patient will let us know if another clearance is needed.  Will route to Lattie Haw, RN to make aware.

## 2018-09-12 ENCOUNTER — Encounter: Payer: Self-pay | Admitting: Cardiology

## 2018-09-12 NOTE — Progress Notes (Signed)
Remote pacemaker transmission.   

## 2018-09-18 NOTE — H&P (Signed)
[facility]  [address] [place], [place] [address] [phone]   A Division of [facility]  RE: [place], [place]   [id number]      DOB: 06-Sep-1940 06-21-18   REASON FOR VISIT: Left shoulder pain.   HPI:   Mr. Finlay is a 78 year old gentleman. He is awaiting to have arthroscopic surgery on his left shoulder, extensive debridement, subacromial decompression, distal clavicle excision, biceps tenodesis and likely supraspinatus repair. We discussed he may have a irreparable tear but he wants to go through with this. He fell  on this yesterday and has more pain and soreness around the shoulder.   EXAMINATION: Well appearing male in no apparent distress. There is tenderness over the biceps tendon. He continues to have limited active motion as per his previous examination. Painless  passive  motion. No ecchymosis.   IMAGES: X-rays show no obvious osseous abnormalities.  ASSESSMENT/PLAN: Contusion to the left shoulder in a setting of rotator cuff tear, biceps tendinitis and acromioclavicular arthritis. We will go ahead and do a subacromial/glenohumeral Depo Medrol injection. We will continue to perform medical workup for his surgery.   PROCEDURE NOTE: The patient's clinical condition is marked by substantial pain and/or significant functional disability. Other conservative therapy has not provided relief, is contraindicated, or not appropriate. There is a reasonable likelihood that injection will significantly improve the patient's pain and/or functional disability.   Patient is seated on the exam table, the posterior left  shoulder is prepped with Betadine and alcohol and injected into the subacromial/glenohumeral interval with  40 mg of Depo-Medrol and 1 cc of Marcaine.  Patient tolerates the procedure without difficulty.   Ernesta Amble.  Percell Miller, M.D.  Electronically verified by Ernesta Amble. Percell Miller, M.D. TDMDelorise Royals D:   06-21-18  T:   06-25-18  Cc:  Anastasia Pall, MD  fax [phone]

## 2018-09-19 ENCOUNTER — Other Ambulatory Visit: Payer: Self-pay | Admitting: Neurological Surgery

## 2018-09-19 DIAGNOSIS — M25551 Pain in right hip: Secondary | ICD-10-CM

## 2018-09-27 ENCOUNTER — Ambulatory Visit
Admission: RE | Admit: 2018-09-27 | Discharge: 2018-09-27 | Disposition: A | Discharge status: Home / Self Care | Payer: Medicare HMO | Source: Ambulatory Visit | Attending: Neurological Surgery | Admitting: Neurological Surgery

## 2018-09-27 ENCOUNTER — Other Ambulatory Visit: Payer: Self-pay

## 2018-09-27 ENCOUNTER — Other Ambulatory Visit (HOSPITAL_COMMUNITY)
Admission: RE | Admit: 2018-09-27 | Discharge: 2018-09-27 | Disposition: A | Discharge status: Home / Self Care | Payer: Medicare HMO | Source: Ambulatory Visit | Attending: Orthopedic Surgery | Admitting: Orthopedic Surgery

## 2018-09-27 DIAGNOSIS — M545 Low back pain, unspecified: Secondary | ICD-10-CM

## 2018-09-27 DIAGNOSIS — Z01812 Encounter for preprocedural laboratory examination: Secondary | ICD-10-CM | POA: Insufficient documentation

## 2018-09-27 DIAGNOSIS — Z20828 Contact with and (suspected) exposure to other viral communicable diseases: Secondary | ICD-10-CM | POA: Insufficient documentation

## 2018-09-27 DIAGNOSIS — M25551 Pain in right hip: Secondary | ICD-10-CM

## 2018-09-27 LAB — SARS CORONAVIRUS 2 (TAT 6-24 HRS): SARS Coronavirus 2: NEGATIVE

## 2018-09-30 ENCOUNTER — Encounter (HOSPITAL_BASED_OUTPATIENT_CLINIC_OR_DEPARTMENT_OTHER): Payer: Self-pay | Admitting: *Deleted

## 2018-09-30 NOTE — Progress Notes (Addendum)
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 / [place]   Surgeon: Renette Butters, MD      DISCUSSION:78 y.o. never smoker with h/o polio, DM, complete heart block with pacemaker, HTN, A-fib (on Eliquis), CAD (s/p redo CABG 2004), CKD Stage III (stable), sleep apnea w/cpap, left shoulder OA, rotator cuff tear scheduled for above procedure 10/01/2018 with Dr. Edmonia Lynch.   Pt last seen by cardiology, 08/26/2018.  Seen By [name], PA-C.  Per OV note, "Pending upcoming remote transmission.  If he is maintaining sinus rhythm, may hold Eliquis for 2 days prior to shoulder surgery.  He may ended up needing back surgery in the future as well, he will need to hold Eliquis for 3 days prior to that procedure instead.  He should restart Eliquis as soon as possible after the procedures."  Per Dr. Sallyanne Kuster after reviewing remote transmission, "He can go ahead and schedule his shoulder surgery.  Will need to stop the Eliquis for at least 2 days before procedure."  Anticipate pt can proceed with planned procedure barring acute status change.    Device orders pending.  VS: There were no vitals taken for this visit.  PROVIDERS: [name], MD is PCP   [name], Dani Gobble, MD is Cardiologist  LABS: SDW (all labs ordered are listed, but only abnormal results are displayed)  Labs Reviewed - No data to display   IMAGES:   EKG: 07/25/2018 Rate 71 bpm Ventricular-paced rhythm  Abnormal ECG   CV: Echo 11/04/15 Study Conclusions  - Left ventricle: The cavity size was normal. Wall thickness was   normal. Systolic function was normal. The estimated  ejection   fraction was in the range of 55% to 60%. Wall motion was normal;   there were no regional wall motion abnormalities. Features are   consistent with a pseudonormal left ventricular filling pattern,   with concomitant abnormal relaxation and increased filling   pressure (grade 2 diastolic dysfunction). - Mitral valve: Calcified annulus. There was trivial regurgitation. - Left atrium: The atrium was moderately dilated. Volume, ES,   (1-plane Simpson&'s, A2C): 45.2 ml. - Right ventricle: The cavity size was mildly dilated. Wall   thickness was normal. Pacer wire or catheter noted in right   ventricle. - Right atrium: The atrium was mildly dilated. Pacer wire or   catheter noted in right atrium. Past Medical History:  Diagnosis Date  . A-fib ([place])   . Anticoagulant long-term use, on coumadin for PAF 03/08/2012  . Chronic back pain   . Complete heart block ([place])   . Coronary artery disease    seen at [facility] cardiology   . Diabetes mellitus   . History of kidney stones   . Hx of echocardiogram 06/29/2008   EF 45-50%   . Hypertension    Dr. Veronia Beets, primary  . Neuromuscular disorder ([place])    carpal tunnel bilaterally  . 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 Polio    hx of  . Sleep apnea    cpap, 11, last sleep study Jan2013, sees Dr. Keturah Barre  . Status post placement of cardiac pacemaker, 03/07/12 New Medtronic Gen and new Vent lead original placed 2008 03/08/2012  Past Surgical History:  Procedure Laterality Date  . APPENDECTOMY    . BACK SURGERY    . BILATERAL CARPAL TUNNEL RELEASE    . CARDIAC CATHETERIZATION    . CORONARY ARTERY BYPASS GRAFT  1990  . CORONARY ARTERY BYPASS GRAFT  2004  . ESOPHAGOGASTRODUODENOSCOPY Left 03/18/2013   Procedure: ESOPHAGOGASTRODUODENOSCOPY (EGD);  Surgeon: Cleotis Nipper, MD;  Location[name] ENDOSCOPY;  Service: Endoscopy;  Laterality: Left;  . EYE SURGERY Bilateral    cataract surgery with lens implants  . FRACTURE  SURGERY     Hx: of right leg  . HARDWARE REMOVAL N/A 05/18/2016   Procedure: Removal of broken hardware Lumbar three-four;  Surgeon: [name], MD;  Location: [facility];  Service: Neurosurgery;  Laterality: N/A;  . HERNIA REPAIR    . INSERT / REPLACE / REMOVE PACEMAKER  03/07/2012   replaced  . JOINT REPLACEMENT     Hx: of left knee  . LEFT HEART CATHETERIZATION WITH CORONARY/GRAFT ANGIOGRAM N/A 12/21/2010   Procedure: LEFT HEART CATHETERIZATION WITH Beatrix Fetters;  Surgeon: Leonie Man, MD;  Location: [facility] CATH LAB;  Service: Cardiovascular;  Laterality: N/A;  . LEG SURGERY Right    lengthened his leg  . LUMBAR LAMINECTOMY/DECOMPRESSION MICRODISCECTOMY  01/10/2012   Procedure: LUMBAR LAMINECTOMY/DECOMPRESSION MICRODISCECTOMY 1 LEVEL;  Surgeon: [name], MD;  Location: [facility]URO [facility]S;  Service: Neurosurgery;  Laterality: Bilateral;  Lumbar three-four decompression, Posterior lateral fusion lumbar three-four, posterior spinus plate lumbar three-four  . LUMBAR LAMINECTOMY/DECOMPRESSION MICRODISCECTOMY Left 05/18/2016   Procedure: Laminectomy and Foraminotomy - Lumbar two-lumbar three- Lumbar four-lumbar five- left with removal hardware lumbar three-four;  Surgeon: [name], MD;  Location: [facility];  Service: Neurosurgery;  Laterality: Left;  [name] Kitchen MAZE Procedure  2004  . NASAL SEPTUM SURGERY N/A 14  . PACEMAKER INSERTION  08, 1/14  . PACEMAKER REVISION N/A 03/07/2012   Procedure: PACEMAKER REVISION;  Surgeon: Sanda Klein, MD;  Location: [facility] CATH LAB;  Service: Cardiovascular;  Laterality: N/A;  . POSTERIOR FUSION LUMBAR SPINE  08/28/2012   Dr Ronnald Ramp  . SHOULDER ARTHROSCOPY WITH OPEN ROTATOR CUFF REPAIR Right 03/30/2014   Procedure: RIGHT SHOULDER ARTHROSCOPY  OPEN ROTATOR CUFF REPAIR;  Surgeon: Kerin Salen, MD;  Location: [facility];  Service: Orthopedics;  Laterality: Right;  . SHOULDER OPEN ROTATOR CUFF REPAIR Right 03/30/2014   Procedure: ROTATOR CUFF REPAIR SHOULDER OPEN;  Surgeon:  Kerin Salen, MD;  Location: [facility];  Service: Orthopedics;  Laterality: Right;  . STENTS    . TONSILLECTOMY      MEDICATIONS: No current facility-administered medications for this encounter.    [name] Kitchen amLODipine (NORVASC) 10 MG tablet  . apixaban (ELIQUIS) 5 MG TABS tablet  . Ascorbic Acid (VITAMIN C PO)  . CALCIUM PO  . carboxymethylcellulose (REFRESH PLUS) 0.5 % SOLN  . carvedilol (COREG) 25 MG tablet  . diclofenac sodium (VOLTAREN) 1 % GEL  . ferrous sulfate 325 (65 FE) MG tablet  . fluticasone (FLONASE) 50 MCG/ACT nasal spray  . glipiZIDE (GLUCOTROL) 10 MG tablet  . hydrochlorothiazide (HYDRODIURIL) 25 MG tablet  . Lancets (FREESTYLE) lancets  . losartan (COZAAR) 100 MG tablet  . metFORMIN (GLUCOPHAGE) 500 MG tablet  . metFORMIN (GLUCOPHAGE-XR) 500 MG 24 hr tablet  . methocarbamol (ROBAXIN) 500 MG tablet  . Multiple Vitamin (MULTIVITAMIN WITH MINERALS) TABS tablet  . oxyCODONE (OXY IR/ROXICODONE) 5 MG immediate release tablet      [facility] Pre-Surgical Testing [phone] 09/30/18  3:05 PM

## 2018-09-30 NOTE — Progress Notes (Addendum)
Spoke w/ pt via phone for pre-op interview.  Npo after mn.  Arrive at 0930.  Needs istat 8.  Current ekg in chart and epic.  Pt had covid test done 09-27-2018.  Will take coreg and norvasc am dos w/ sips of water.  Pt take eliquis, managed by cardiologist dr croitoru.  Clearance telephone note dated 09-08-2018 in epic.  Pt stated was given instructions by cardiology to stop 2 days before surgery.  Pt stated last dose evening of 09-27-2018.  Asked pt to bring cpap mask/ tubing.  Last pacer check dated 09-05-2018 in epic.  Lov cardiology note dated 08-26-2018 in chart and epic.  Sent device order to [facility] on Friday 09-27-2018 via fax.  No fax received today and has been refaxed twice , once this morning and another this evening.  Pt denies any cardiac s&s.  Chart has been reviewed by anesthesia, Chip Boer PA, (refer to her note in epic).  Ok to proceed.   ADDENDUM:  Received pacer device order via fax, placed in chart.

## 2018-10-01 ENCOUNTER — Ambulatory Visit (HOSPITAL_BASED_OUTPATIENT_CLINIC_OR_DEPARTMENT_OTHER): Payer: Medicare HMO | Admitting: Physician Assistant

## 2018-10-01 ENCOUNTER — Encounter (HOSPITAL_BASED_OUTPATIENT_CLINIC_OR_DEPARTMENT_OTHER)
Admission: RE | Disposition: A | Discharge status: Home / Self Care | Payer: Self-pay | Source: Home / Self Care | Attending: Orthopedic Surgery

## 2018-10-01 ENCOUNTER — Other Ambulatory Visit: Payer: Self-pay

## 2018-10-01 ENCOUNTER — Ambulatory Visit (HOSPITAL_BASED_OUTPATIENT_CLINIC_OR_DEPARTMENT_OTHER)
Admission: RE | Admit: 2018-10-01 | Discharge: 2018-10-01 | Disposition: A | Discharge status: Home / Self Care | Payer: Medicare HMO | Attending: Orthopedic Surgery | Admitting: Orthopedic Surgery

## 2018-10-01 ENCOUNTER — Encounter (HOSPITAL_BASED_OUTPATIENT_CLINIC_OR_DEPARTMENT_OTHER): Payer: Self-pay | Admitting: Anesthesiology

## 2018-10-01 DIAGNOSIS — G4730 Sleep apnea, unspecified: Secondary | ICD-10-CM | POA: Diagnosis not present

## 2018-10-01 DIAGNOSIS — M948X1 Other specified disorders of cartilage, shoulder: Secondary | ICD-10-CM | POA: Insufficient documentation

## 2018-10-01 DIAGNOSIS — M75102 Unspecified rotator cuff tear or rupture of left shoulder, not specified as traumatic: Secondary | ICD-10-CM | POA: Insufficient documentation

## 2018-10-01 DIAGNOSIS — Z951 Presence of aortocoronary bypass graft: Secondary | ICD-10-CM | POA: Diagnosis not present

## 2018-10-01 DIAGNOSIS — I2510 Atherosclerotic heart disease of native coronary artery without angina pectoris: Secondary | ICD-10-CM | POA: Diagnosis not present

## 2018-10-01 DIAGNOSIS — E119 Type 2 diabetes mellitus without complications: Secondary | ICD-10-CM | POA: Diagnosis not present

## 2018-10-01 DIAGNOSIS — Z79899 Other long term (current) drug therapy: Secondary | ICD-10-CM | POA: Diagnosis not present

## 2018-10-01 DIAGNOSIS — Z7984 Long term (current) use of oral hypoglycemic drugs: Secondary | ICD-10-CM | POA: Insufficient documentation

## 2018-10-01 DIAGNOSIS — I10 Essential (primary) hypertension: Secondary | ICD-10-CM | POA: Insufficient documentation

## 2018-10-01 DIAGNOSIS — M19012 Primary osteoarthritis, left shoulder: Secondary | ICD-10-CM | POA: Insufficient documentation

## 2018-10-01 DIAGNOSIS — Z7901 Long term (current) use of anticoagulants: Secondary | ICD-10-CM | POA: Diagnosis not present

## 2018-10-01 DIAGNOSIS — Z955 Presence of coronary angioplasty implant and graft: Secondary | ICD-10-CM | POA: Insufficient documentation

## 2018-10-01 HISTORY — DX: Presence of aortocoronary bypass graft: Z95.1

## 2018-10-01 HISTORY — DX: Coronary angioplasty status: Z98.61

## 2018-10-01 HISTORY — DX: Presence of cardiac pacemaker: Z95.0

## 2018-10-01 HISTORY — DX: Bicipital tendinitis, left shoulder: M75.22

## 2018-10-01 HISTORY — PX: SHOULDER ARTHROSCOPY WITH SUBACROMIAL DECOMPRESSION, ROTATOR CUFF REPAIR AND BICEP TENDON REPAIR: SHX5687

## 2018-10-01 HISTORY — DX: Primary osteoarthritis, left shoulder: M19.012

## 2018-10-01 HISTORY — DX: Paroxysmal atrial fibrillation: I48.0

## 2018-10-01 HISTORY — DX: Atrioventricular block, complete: I44.2

## 2018-10-01 HISTORY — DX: Personal history of other diseases of the digestive system: Z87.19

## 2018-10-01 HISTORY — DX: Chronic kidney disease, stage 3 unspecified: N18.30

## 2018-10-01 HISTORY — DX: Unspecified osteoarthritis, unspecified site: M19.90

## 2018-10-01 HISTORY — DX: Postpolio syndrome: G14

## 2018-10-01 HISTORY — DX: Obstructive sleep apnea (adult) (pediatric): G47.33

## 2018-10-01 HISTORY — DX: Sick sinus syndrome: I49.5

## 2018-10-01 HISTORY — DX: Ischemic cardiomyopathy: I25.5

## 2018-10-01 HISTORY — DX: Nocturia: R35.1

## 2018-10-01 HISTORY — DX: Type 2 diabetes mellitus without complications: E11.9

## 2018-10-01 HISTORY — DX: Unspecified rotator cuff tear or rupture of left shoulder, not specified as traumatic: M75.102

## 2018-10-01 HISTORY — DX: Mixed hyperlipidemia: E78.2

## 2018-10-01 HISTORY — DX: Bursitis of left shoulder: M75.52

## 2018-10-01 LAB — GLUCOSE, CAPILLARY
Glucose-Capillary: 162 mg/dL — ABNORMAL HIGH (ref 70–99)
Glucose-Capillary: 126 mg/dL — ABNORMAL HIGH (ref 70–99)

## 2018-10-01 SURGERY — SHOULDER ARTHROSCOPY WITH SUBACROMIAL DECOMPRESSION, ROTATOR CUFF REPAIR AND BICEP TENDON REPAIR
Anesthesia: General | Site: Shoulder | Laterality: Left

## 2018-10-01 MED ORDER — SUGAMMADEX SODIUM 500 MG/5ML IV SOLN
INTRAVENOUS | Status: AC
  Filled 2018-10-01: qty 5

## 2018-10-01 MED ORDER — FENTANYL CITRATE (PF) 100 MCG/2ML IJ SOLN
25.0000 ug | INTRAMUSCULAR | Status: DC | PRN
  Filled 2018-10-01: qty 1

## 2018-10-01 MED ORDER — CEFAZOLIN SODIUM-DEXTROSE 2-4 GM/100ML-% IV SOLN
2.0000 g | INTRAVENOUS | Status: AC
  Administered 2018-10-01: 13:00:00 2 g via INTRAVENOUS
  Filled 2018-10-01: qty 100

## 2018-10-01 MED ORDER — BUPIVACAINE-EPINEPHRINE 0.25% -1:200000 IJ SOLN
INTRAMUSCULAR | Status: DC | PRN
  Administered 2018-10-01: 5 mL

## 2018-10-01 MED ORDER — KETOROLAC TROMETHAMINE 15 MG/ML IJ SOLN
15.0000 mg | Freq: Once | INTRAMUSCULAR | Status: DC
  Filled 2018-10-01: qty 1

## 2018-10-01 MED ORDER — LIDOCAINE HCL (CARDIAC) PF 100 MG/5ML IV SOSY
PREFILLED_SYRINGE | INTRAVENOUS | Status: DC | PRN
  Administered 2018-10-01: 100 mg via INTRAVENOUS

## 2018-10-01 MED ORDER — ACETAMINOPHEN 500 MG PO TABS
ORAL_TABLET | ORAL | Status: AC
  Filled 2018-10-01: qty 2

## 2018-10-01 MED ORDER — FENTANYL CITRATE (PF) 100 MCG/2ML IJ SOLN
INTRAMUSCULAR | Status: DC | PRN
  Administered 2018-10-01: 50 ug via INTRAVENOUS
  Administered 2018-10-01 (×2): 25 ug via INTRAVENOUS

## 2018-10-01 MED ORDER — ONDANSETRON HCL 4 MG/2ML IJ SOLN
INTRAMUSCULAR | Status: AC
  Filled 2018-10-01: qty 2

## 2018-10-01 MED ORDER — MIDAZOLAM HCL 2 MG/2ML IJ SOLN
INTRAMUSCULAR | Status: AC
  Filled 2018-10-01: qty 2

## 2018-10-01 MED ORDER — ENSURE PRE-SURGERY PO LIQD
296.0000 mL | Freq: Once | ORAL | Status: DC
  Filled 2018-10-01: qty 296

## 2018-10-01 MED ORDER — ROCURONIUM BROMIDE 10 MG/ML (PF) SYRINGE
PREFILLED_SYRINGE | INTRAVENOUS | Status: AC
  Filled 2018-10-01: qty 10

## 2018-10-01 MED ORDER — ONDANSETRON HCL 4 MG/2ML IJ SOLN
4.0000 mg | Freq: Once | INTRAMUSCULAR | Status: DC | PRN
  Filled 2018-10-01: qty 2

## 2018-10-01 MED ORDER — FENTANYL CITRATE (PF) 100 MCG/2ML IJ SOLN
INTRAMUSCULAR | Status: AC
  Filled 2018-10-01: qty 2

## 2018-10-01 MED ORDER — SODIUM CHLORIDE 0.9 % IR SOLN
Status: DC | PRN
  Administered 2018-10-01 (×4): 6000 mL

## 2018-10-01 MED ORDER — SUCCINYLCHOLINE CHLORIDE 20 MG/ML IJ SOLN
INTRAMUSCULAR | Status: DC | PRN
  Administered 2018-10-01: 120 mg via INTRAVENOUS

## 2018-10-01 MED ORDER — MEPERIDINE HCL 25 MG/ML IJ SOLN
6.2500 mg | INTRAMUSCULAR | Status: DC | PRN
  Filled 2018-10-01: qty 1

## 2018-10-01 MED ORDER — ACETAMINOPHEN 325 MG PO TABS
325.0000 mg | ORAL_TABLET | ORAL | Status: DC | PRN
  Filled 2018-10-01: qty 2

## 2018-10-01 MED ORDER — LIDOCAINE 2% (20 MG/ML) 5 ML SYRINGE
INTRAMUSCULAR | Status: AC
  Filled 2018-10-01: qty 5

## 2018-10-01 MED ORDER — PHENYLEPHRINE HCL (PRESSORS) 10 MG/ML IV SOLN
INTRAVENOUS | Status: DC | PRN
  Administered 2018-10-01 (×6): 40 ug via INTRAVENOUS

## 2018-10-01 MED ORDER — ACETAMINOPHEN 500 MG PO TABS: 1000.0000 mg | ORAL_TABLET | Freq: Three times a day (TID) | ORAL | 0 refills | Status: AC

## 2018-10-01 MED ORDER — BACLOFEN 10 MG PO TABS: 10.0000 mg | ORAL_TABLET | Freq: Three times a day (TID) | ORAL | 0 refills | Status: DC | PRN

## 2018-10-01 MED ORDER — ACETAMINOPHEN 160 MG/5ML PO SOLN
325.0000 mg | ORAL | Status: DC | PRN
  Filled 2018-10-01: qty 20.3

## 2018-10-01 MED ORDER — SUCCINYLCHOLINE CHLORIDE 200 MG/10ML IV SOSY
PREFILLED_SYRINGE | INTRAVENOUS | Status: AC
  Filled 2018-10-01: qty 10

## 2018-10-01 MED ORDER — ONDANSETRON HCL 4 MG/2ML IJ SOLN
INTRAMUSCULAR | Status: DC | PRN
  Administered 2018-10-01: 4 mg via INTRAVENOUS

## 2018-10-01 MED ORDER — PROPOFOL 10 MG/ML IV BOLUS
INTRAVENOUS | Status: DC | PRN
  Administered 2018-10-01: 300 mg via INTRAVENOUS

## 2018-10-01 MED ORDER — EPHEDRINE SULFATE 50 MG/ML IJ SOLN
INTRAMUSCULAR | Status: DC | PRN
  Administered 2018-10-01 (×3): 10 mg via INTRAVENOUS

## 2018-10-01 MED ORDER — PROPOFOL 10 MG/ML IV BOLUS
INTRAVENOUS | Status: AC
  Filled 2018-10-01: qty 40

## 2018-10-01 MED ORDER — MIDAZOLAM HCL 2 MG/2ML IJ SOLN
1.0000 mg | INTRAMUSCULAR | Status: AC | PRN
  Administered 2018-10-01 (×2): 1 mg via INTRAVENOUS
  Filled 2018-10-01: qty 1

## 2018-10-01 MED ORDER — OXYCODONE HCL 5 MG PO TABS: 5.0000 mg | ORAL_TABLET | ORAL | 0 refills | Status: AC | PRN

## 2018-10-01 MED ORDER — METHYLPREDNISOLONE ACETATE 80 MG/ML IJ SUSP
INTRAMUSCULAR | Status: DC | PRN
  Administered 2018-10-01: 80 mg

## 2018-10-01 MED ORDER — ACETAMINOPHEN 500 MG PO TABS
1000.0000 mg | ORAL_TABLET | Freq: Once | ORAL | Status: AC
  Administered 2018-10-01: 1000 mg via ORAL
  Filled 2018-10-01: qty 2

## 2018-10-01 MED ORDER — OXYCODONE HCL 5 MG PO TABS
5.0000 mg | ORAL_TABLET | Freq: Once | ORAL | Status: DC | PRN
  Filled 2018-10-01: qty 1

## 2018-10-01 MED ORDER — LACTATED RINGERS IV SOLN
INTRAVENOUS | Status: DC
  Administered 2018-10-01: 50 mL/h via INTRAVENOUS
  Filled 2018-10-01: qty 1000

## 2018-10-01 MED ORDER — DEXAMETHASONE SODIUM PHOSPHATE 10 MG/ML IJ SOLN
INTRAMUSCULAR | Status: AC
  Filled 2018-10-01: qty 1

## 2018-10-01 MED ORDER — CEFAZOLIN SODIUM-DEXTROSE 2-4 GM/100ML-% IV SOLN
INTRAVENOUS | Status: AC
  Filled 2018-10-01: qty 100

## 2018-10-01 MED ORDER — SODIUM CHLORIDE 0.9 % IV SOLN
INTRAVENOUS | Status: DC
  Administered 2018-10-01: 14:00:00 via INTRAVENOUS
  Administered 2018-10-01: 50 mL/h via INTRAVENOUS
  Filled 2018-10-01: qty 1000

## 2018-10-01 MED ORDER — EPHEDRINE 5 MG/ML INJ
INTRAVENOUS | Status: AC
  Filled 2018-10-01: qty 10

## 2018-10-01 MED ORDER — FENTANYL CITRATE (PF) 100 MCG/2ML IJ SOLN
50.0000 ug | INTRAMUSCULAR | Status: AC | PRN
  Administered 2018-10-01 (×2): 50 ug via INTRAVENOUS
  Filled 2018-10-01: qty 1

## 2018-10-01 MED ORDER — PROPOFOL 10 MG/ML IV BOLUS
INTRAVENOUS | Status: AC
  Filled 2018-10-01: qty 20

## 2018-10-01 MED ORDER — PHENYLEPHRINE 40 MCG/ML (10ML) SYRINGE FOR IV PUSH (FOR BLOOD PRESSURE SUPPORT)
PREFILLED_SYRINGE | INTRAVENOUS | Status: AC
  Filled 2018-10-01: qty 10

## 2018-10-01 MED ORDER — BUPIVACAINE-EPINEPHRINE (PF) 0.5% -1:200000 IJ SOLN
INTRAMUSCULAR | Status: DC | PRN
  Administered 2018-10-01 (×5): 3 mL via PERINEURAL

## 2018-10-01 MED ORDER — GLYCOPYRROLATE 0.2 MG/ML IJ SOLN
INTRAMUSCULAR | Status: DC | PRN
  Administered 2018-10-01: 0.2 mg via INTRAVENOUS

## 2018-10-01 MED ORDER — DEXAMETHASONE SODIUM PHOSPHATE 4 MG/ML IJ SOLN
INTRAMUSCULAR | Status: DC | PRN
  Administered 2018-10-01: 10 mg via INTRAVENOUS

## 2018-10-01 MED ORDER — ONDANSETRON HCL 4 MG PO TABS: 4.0000 mg | ORAL_TABLET | Freq: Three times a day (TID) | ORAL | 0 refills | Status: DC | PRN

## 2018-10-01 MED ORDER — SUGAMMADEX SODIUM 200 MG/2ML IV SOLN
INTRAVENOUS | Status: DC | PRN
  Administered 2018-10-01: 250 mg via INTRAVENOUS

## 2018-10-01 MED ORDER — ROCURONIUM BROMIDE 100 MG/10ML IV SOLN
INTRAVENOUS | Status: DC | PRN
  Administered 2018-10-01: 50 mg via INTRAVENOUS

## 2018-10-01 MED ORDER — BUPIVACAINE LIPOSOME 1.3 % IJ SUSP
INTRAMUSCULAR | Status: DC | PRN
  Administered 2018-10-01: 2 mL via PERINEURAL
  Administered 2018-10-01: 2 mL
  Administered 2018-10-01 (×3): 2 mL via PERINEURAL

## 2018-10-01 MED ORDER — OXYCODONE HCL 5 MG/5ML PO SOLN
5.0000 mg | Freq: Once | ORAL | Status: DC | PRN
  Filled 2018-10-01: qty 5

## 2018-10-01 MED ORDER — CHLORHEXIDINE GLUCONATE 4 % EX LIQD
60.0000 mL | Freq: Once | CUTANEOUS | Status: DC
  Filled 2018-10-01: qty 118

## 2018-10-01 SURGICAL SUPPLY — 85 items
ANCHOR SUT BIO SW 4.75X19.1 (Anchor) ×2 IMPLANT
ANCHOR SUT BIOCOMP LK 2.9X12.5 (Anchor) ×2 IMPLANT
BLADE CUTTER GATOR 3.5 (BLADE) IMPLANT
BLADE SURG 15 STRL LF DISP TIS (BLADE) IMPLANT
BLADE SURG 15 STRL SS (BLADE)
BUR OVAL 4.0 (BURR) IMPLANT
BUR OVAL 6.0 (BURR) IMPLANT
BURR CLEARCUT OVAL 5.5X13 (MISCELLANEOUS) IMPLANT
BURR OVAL 12 FL 5.5X13 (MISCELLANEOUS)
BURR OVAL 8 FLU 4.0X13 (MISCELLANEOUS) ×2 IMPLANT
CANNULA 5.75X71 LONG (CANNULA) IMPLANT
CANNULA TWIST IN 8.25X7CM (CANNULA) ×2 IMPLANT
CHLORAPREP W/TINT 26ML (MISCELLANEOUS) ×2 IMPLANT
COVER WAND RF STERILE (DRAPES) ×2 IMPLANT
DISSECTOR 4.0MM X 13CM (MISCELLANEOUS) ×2 IMPLANT
DRAPE IMP U-DRAPE 54X76 (DRAPES) ×2 IMPLANT
DRAPE INCISE IOBAN 66X45 STRL (DRAPES) ×2 IMPLANT
DRAPE SHOULDER BEACH CHAIR (DRAPES) ×2 IMPLANT
DRAPE U-SHAPE 47X51 STRL (DRAPES) ×2 IMPLANT
DRSG ADAPTIC 3X8 NADH LF (GAUZE/BANDAGES/DRESSINGS) ×2 IMPLANT
DRSG EMULSION OIL 3X3 NADH (GAUZE/BANDAGES/DRESSINGS) IMPLANT
DRSG PAD ABDOMINAL 8X10 ST (GAUZE/BANDAGES/DRESSINGS) ×2 IMPLANT
ELECT REM PT RETURN 9FT ADLT (ELECTROSURGICAL)
ELECTRODE REM PT RTRN 9FT ADLT (ELECTROSURGICAL) IMPLANT
GAUZE SPONGE 4X4 12PLY STRL (GAUZE/BANDAGES/DRESSINGS) ×2 IMPLANT
GLOVE BIO SURGEON STRL SZ7 (GLOVE) ×2 IMPLANT
GLOVE BIO SURGEON STRL SZ7.5 (GLOVE) ×4 IMPLANT
GLOVE BIOGEL PI IND STRL 7.0 (GLOVE) ×1 IMPLANT
GLOVE BIOGEL PI IND STRL 8 (GLOVE) ×2 IMPLANT
GLOVE BIOGEL PI INDICATOR 7.0 (GLOVE) ×1
GLOVE BIOGEL PI INDICATOR 8 (GLOVE) ×2
GOWN STRL REUS W/ TWL LRG LVL3 (GOWN DISPOSABLE) ×3 IMPLANT
GOWN STRL REUS W/ TWL XL LVL3 (GOWN DISPOSABLE) IMPLANT
GOWN STRL REUS W/TWL LRG LVL3 (GOWN DISPOSABLE) ×3
GOWN STRL REUS W/TWL XL LVL3 (GOWN DISPOSABLE)
IMPL SPEEDBRIDGE KIT (Orthopedic Implant) ×1 IMPLANT
IMPLANT SPEEDBRIDGE KIT (Orthopedic Implant) ×2 IMPLANT
IV NS IRRIG 3000ML ARTHROMATIC (IV SOLUTION) ×8 IMPLANT
KIT INSERTION 2.9 PUSHLOCK (KITS) ×2 IMPLANT
KIT PUSHLOCK 2.9 HIP (KITS) ×2 IMPLANT
MANIFOLD NEPTUNE II (INSTRUMENTS) ×2 IMPLANT
NDL SUT 6 .5 CRC .975X.05 MAYO (NEEDLE) IMPLANT
NEEDLE MAYO TAPER (NEEDLE)
NEEDLE SCORPION MULTI FIRE (NEEDLE) IMPLANT
NS IRRIG 1000ML POUR BTL (IV SOLUTION) IMPLANT
PACK ARTHROSCOPY DSU (CUSTOM PROCEDURE TRAY) ×2 IMPLANT
PACK BASIN DAY SURGERY FS (CUSTOM PROCEDURE TRAY) ×2 IMPLANT
PAD ABD 8X10 STRL (GAUZE/BANDAGES/DRESSINGS) ×2 IMPLANT
PASSER SUT SWIFTSTITCH HIP CRT (INSTRUMENTS) ×2 IMPLANT
PENCIL BUTTON HOLSTER BLD 10FT (ELECTRODE) IMPLANT
PORT APPOLLO RF 90DEGREE MULTI (SURGICAL WAND) ×2 IMPLANT
PROBE BIPOLAR ATHRO 135MM 90D (MISCELLANEOUS) IMPLANT
RESTRAINT HEAD UNIVERSAL NS (MISCELLANEOUS) ×2 IMPLANT
SLEEVE SCD COMPRESS KNEE MED (MISCELLANEOUS) ×2 IMPLANT
SLING ARM FOAM STRAP LRG (SOFTGOODS) IMPLANT
SLING ARM IMMOBILIZER LRG (SOFTGOODS) IMPLANT
SLING ARM IMMOBILIZER MED (SOFTGOODS) IMPLANT
SLING ARM MED ADULT FOAM STRAP (SOFTGOODS) IMPLANT
SLING ARM XL FOAM STRAP (SOFTGOODS) IMPLANT
SLING ULTRA II L (ORTHOPEDIC SUPPLIES) ×2 IMPLANT
STRIP CLOSURE SKIN 1/2X4 (GAUZE/BANDAGES/DRESSINGS) ×2 IMPLANT
SUCTION FRAZIER HANDLE 10FR (MISCELLANEOUS)
SUCTION TUBE FRAZIER 10FR DISP (MISCELLANEOUS) IMPLANT
SUT ETHIBOND 2 OS 4 DA (SUTURE) IMPLANT
SUT ETHILON 2 0 FS 18 (SUTURE) IMPLANT
SUT ETHILON 3 0 PS 1 (SUTURE) ×2 IMPLANT
SUT FIBERWIRE #2 38 T-5 BLUE (SUTURE)
SUT MNCRL AB 4-0 PS2 18 (SUTURE) IMPLANT
SUT TIGER TAPE 7 IN WHITE (SUTURE) IMPLANT
SUT VIC AB 0 CT1 27 (SUTURE)
SUT VIC AB 0 CT1 27XBRD ANBCTR (SUTURE) IMPLANT
SUT VIC AB 2-0 SH 27 (SUTURE)
SUT VIC AB 2-0 SH 27XBRD (SUTURE) IMPLANT
SUT VIC AB 3-0 FS2 27 (SUTURE) IMPLANT
SUTURE ANCHOR BIOCOMPOSITE PUSHLOCK 2.9 X 12.5 MM ×2 IMPLANT
SUTURE FIBERWR #2 38 T-5 BLUE (SUTURE) IMPLANT
SUTURE TAPE 1.3 40 TPR END (SUTURE) ×1 IMPLANT
SUTURE TAPE TIGERLINK 1.3MM BL (SUTURE) IMPLANT
SUTURETAPE 1.3 40 TPR END (SUTURE) ×2
SUTURETAPE TIGERLINK 1.3MM BL (SUTURE)
TAPE FIBER 2MM 7IN #2 BLUE (SUTURE) IMPLANT
TOWEL OR 17X26 10 PK STRL BLUE (TOWEL DISPOSABLE) ×2 IMPLANT
TUBING ARTHROSCOPY IRRIG 16FT (MISCELLANEOUS) ×2 IMPLANT
WATER STERILE IRR 1000ML POUR (IV SOLUTION) ×2 IMPLANT
YANKAUER SUCT BULB TIP NO VENT (SUCTIONS) IMPLANT

## 2018-10-01 NOTE — Anesthesia Procedure Notes (Signed)
Anesthesia Regional Block: Supraclavicular block   Pre-Anesthetic Checklist: ,, timeout performed, Correct Patient, Correct Site, Correct Laterality, Correct Procedure, Correct Position, site marked, Risks and benefits discussed,  Surgical consent,  Pre-op evaluation,  At surgeon's request and post-op pain management  Laterality: Upper and Left  Prep: chloraprep       Needles:  Injection technique: Single-shot  Needle Type: Echogenic Stimulator Needle     Needle Length: 10cm  Needle Gauge: 21   Needle insertion depth: 4 cm   Additional Needles:   Procedures:,,,, ultrasound used (permanent image in chart),,,,  Narrative:  Start time: 10/01/2018 10:50 AM End time: 10/01/2018 11:15 AM Injection made incrementally with aspirations every 5 mL.  Performed by: Personally  Anesthesiologist: Lyn Hollingshead, MD

## 2018-10-01 NOTE — Interval H&P Note (Signed)
I participated in the care of this patient and agree with the above history, physical and evaluation. I performed a review of the history and a physical exam as detailed   Kase Shughart Daniel Marcos Ruelas MD  

## 2018-10-01 NOTE — Anesthesia Postprocedure Evaluation (Signed)
Anesthesia Post Note  Patient: Alan [name]  Procedure(s) Performed: LEFT SHOULDER ARTHROSCOPY, DISTAL CLAVICLE EXCISION,SUBACROMIAL, DECOMPRESSION, RORATOR CUFF REPAIR, BICEPS TENODESIS (Left Shoulder)     Patient location during evaluation: PACU Anesthesia Type: General Level of consciousness: awake Pain management: pain level controlled Vital Signs Assessment: post-procedure vital signs reviewed and stable Respiratory status: spontaneous breathing Cardiovascular status: stable Postop Assessment: no apparent nausea or vomiting Anesthetic complications: no    Last Vitals:  Vitals:   10/01/18 1505 10/01/18 1510  BP: (!) 96/46 (!) 99/50  Pulse: (!) 59 (!) 59  Resp: (!) 22 20  Temp:    SpO2: 97% 96%    Last Pain:  Vitals:   10/01/18 1500  TempSrc:   PainSc: 0-No pain   Pain Goal: Patients Stated Pain Goal: 5 (10/01/18 1500)                 Huston Foley

## 2018-10-01 NOTE — Progress Notes (Signed)
Assisted Dr. Hatchett with left, ultrasound guided, supraclavicular block. Side rails up, monitors on throughout procedure. See vital signs in flow sheet. Tolerated Procedure well. 

## 2018-10-01 NOTE — Anesthesia Procedure Notes (Signed)
Procedure Name: Intubation Date/Time: 10/01/2018 12:52 PM Performed by: Justice Rocher, CRNA Pre-anesthesia Checklist: Patient identified, Emergency Drugs available, Suction available and Patient being monitored Patient Re-evaluated:Patient Re-evaluated prior to induction Oxygen Delivery Method: Circle system utilized Preoxygenation: Pre-oxygenation with 100% oxygen Induction Type: IV induction Ventilation: Mask ventilation without difficulty Laryngoscope Size: Mac and 4 Grade View: Grade II Tube type: Oral Tube size: 8.0 mm Number of attempts: 1 Airway Equipment and Method: Stylet and Oral airway Placement Confirmation: ETT inserted through vocal cords under direct vision,  positive ETCO2 and breath sounds checked- equal and bilateral Secured at: 24 cm Tube secured with: Tape Dental Injury: Teeth and Oropharynx as per pre-operative assessment

## 2018-10-01 NOTE — Transfer of Care (Addendum)
Immediate Anesthesia Transfer of Care Note  Patient: Alan [name]  Procedure(s) Performed: Procedure(s) (LRB): LEFT SHOULDER ARTHROSCOPY, DISTAL CLAVICLE EXCISION,SUBACROMIAL, DECOMPRESSION, RORATOR CUFF REPAIR, BICEPS TENODESIS (Left)  Patient Location: PACU  Anesthesia Type: General  Level of Consciousness: awake, sedated, patient cooperative and responds to stimulation  Airway & Oxygen Therapy: Patient Spontanous Breathing and Patient connected to [place] O2 and soft face mask  Post-op Assessment: Report given to PACU RN, Post -op Vital signs reviewed and stable and Patient moving all extremities  Post vital signs: Reviewed and stable  Complications: No apparent anesthesia complications

## 2018-10-01 NOTE — Anesthesia Preprocedure Evaluation (Addendum)
Anesthesia Evaluation  Patient identified by MRN, date of birth, ID band Patient awake    Reviewed: Allergy & Precautions, NPO status , Patient's Chart, lab work & pertinent test results, reviewed documented beta blocker date and time   Airway Mallampati: I       Dental  (+) Caps   Pulmonary sleep apnea and Continuous Positive Airway Pressure Ventilation ,    Pulmonary exam normal breath sounds clear to auscultation       Cardiovascular hypertension, Pt. on medications and Pt. on home beta blockers + CAD, + Cardiac Stents and + CABG  Normal cardiovascular exam+ pacemaker  Rhythm:Regular Rate:Normal     Neuro/Psych    GI/Hepatic   Endo/Other  diabetes, Type 2, Oral Hypoglycemic Agents  Renal/GU      Musculoskeletal   Abdominal (+) + obese,   Peds  Hematology   Anesthesia Other Findings Notes recorded by Ricci Barker, RN on 09/10/2018 at 9:20 AM EDT  Patient made aware of results and verbalized understanding. See epic note.   ------   Notes recorded by Ricci Barker, RN on 09/09/2018 at 5:37 PM EDT  Left a message for the patient to call back.   ------   Notes recorded by Sanda Klein, MD on 09/08/2018 at 6:20 PM EDT  Remote reviewed.   Not pacemaker dependent.  Battery status is good.  Lead measurements are stable.  Heart rate histogram is favorable.   He can go ahead and schedule his shoulder surgery. Will need to stop the Eliquis for at least 2 days before the procedure. Please ask if his surgeon needs another "clearance" letter.   Remains in normal rhythm since cardioversion.  Rare nonsustained VT.     Result Notes for ECHOCARDIOGRAM COMPLETE  Notes Recorded by Diana Eves, CMA on 11/09/2015 at 2:54 PM EDT  Called patient with results. Patient verbalized understanding.  ------   Notes Recorded by Sanda Klein, MD on 11/04/2015 at 8:29 PM EDT  Looks similar to previous echo. Dilated  atria are in keeping with history of atrial fib. Dilated right ventricle is mild and probably related to sleep apnea. 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  Does he have anf shortness of breath?   Study Result  Result status: Final result                          [facility]                        [address], [place] [address]                            [phone]  ------------------------------------------------------------------- Transthoracic Echocardiography  Patient:    Alan, [name] MR #:       [id number] Study Date: 11/04/2015 Gender:     M Age:        78 Height:     177.8 cm Weight:     97.2 kg BSA:        2.22 m^2 Pt. Status: Room:   ATTENDING    Candee Furbish, M.D.  SONOGRAPHER  [address], [facility]  ORDERING     Sanda Klein, MD  [place] [name], MD  PERFORMING   [facility], Outpatient  cc:  ------------------------------------------------------------------- LV EF: 55% -   60%  ------------------------------------------------------------------- Indications:      Ischemic cardiomyopathy (I25.5).  ------------------------------------------------------------------- History:   PMH:   Dyspnea.  Atrial fibrillation.  Coronary artery disease.  Ischemic cardiomyopathy.  Risk factors:  V tach. Complete heart block. OSA. Hypertension. Dyslipidemia.  ------------------------------------------------------------------- Study Conclusions  - Left ventricle: The cavity size was normal. Wall thickness was   normal. Systolic function was normal. The estimated ejection   fraction was in the range of 55% to 60%. Wall motion was normal;   there were no regional wall motion abnormalities. Features are   consistent with a pseudonormal left ventricular filling pattern,   with concomitant abnormal relaxation and increased filling   pressure (grade 2 diastolic dysfunction). - Mitral valve: Calcified annulus. There was trivial  regurgitation. - Left atrium: The atrium was moderately dilated. Volume, ES,   (1-plane Simpson&'s, A2C): 45.2 ml. - Right ventricle: The cavity size was mildly dilated. Wall   thickness was normal. Pacer wire or catheter noted in right   ventricle. - Right atrium: The atrium was mildly dilated. Pacer wire or   catheter noted in right atrium.  -------------------------------------------------------------------    Reproductive/Obstetrics                           Anesthesia Physical Anesthesia Plan  ASA: III  Anesthesia Plan: General   Post-op Pain Management:  Regional for Post-op pain   Induction: Intravenous  PONV Risk Score and Plan: Ondansetron and Treatment may vary due to age or medical condition  Airway Management Planned: Oral ETT  Additional Equipment: None  Intra-op Plan:   Post-operative Plan: Extubation in OR  Informed Consent: I have reviewed the patients History and Physical, chart, labs and discussed the procedure including the risks, benefits and alternatives for the proposed anesthesia with the patient or authorized representative who has indicated his/her understanding and acceptance.     Dental advisory given  Plan Discussed with: CRNA  Anesthesia Plan Comments:         Anesthesia Quick Evaluation                                  Anesthesia Evaluation  Patient identified by MRN, date of birth, ID band Patient awake    Reviewed: Allergy & Precautions, NPO status , Patient's Chart, lab work & pertinent test results, reviewed documented beta blocker date and time   History of Anesthesia Complications Negative for: history of anesthetic complications  Airway Mallampati: II  TM Distance: >3 FB Neck ROM: Full    Dental  (+) Caps, Dental Advisory Given   Pulmonary sleep apnea and Continuous Positive Airway Pressure Ventilation ,    breath sounds clear to auscultation        Cardiovascular hypertension, Pt. on medications and Pt. on home beta blockers (-) angina+ CAD, + Cardiac Stents and + CABG  + dysrhythmias (s/p Maze) Atrial Fibrillation and Ventricular Tachycardia + pacemaker (dual chamber Medtronic for CHB)  Rhythm:Regular Rate:Normal  '17 ECHO: EF 55-60%, valves OK   Neuro/Psych negative neurological ROS  negative psych ROS   GI/Hepatic negative GI ROS, Neg liver ROS,   Endo/Other  diabetes (glu 90), Oral Hypoglycemic AgentsMorbid obesity  Renal/GU Renal InsufficiencyRenal disease (creat 1.54)     Musculoskeletal  (+) Arthritis , Osteoarthritis,    Abdominal (+) +  obese,   Peds  Hematology negative hematology ROS (+)   Anesthesia Other Findings   Reproductive/Obstetrics                             Anesthesia Physical Anesthesia Plan  ASA: III  Anesthesia Plan: General   Post-op Pain Management:    Induction: Intravenous  PONV Risk Score and Plan: 3 and Ondansetron and Dexamethasone  Airway Management Planned: Oral ETT  Additional Equipment:   Intra-op Plan:   Post-operative Plan: Extubation in OR  Informed Consent: I have reviewed the patients History and Physical, chart, labs and discussed the procedure including the risks, benefits and alternatives for the proposed anesthesia with the patient or authorized representative who has indicated his/her understanding and acceptance.   Dental advisory given  Plan Discussed with: CRNA and Surgeon  Anesthesia Plan Comments: (Plan routine monitors, GETA)        Anesthesia Quick Evaluation                                   Anesthesia Evaluation  Patient identified by MRN, date of birth, ID band Patient awake    Reviewed: Allergy & Precautions, NPO status , Patient's Chart, lab work & pertinent test results, reviewed documented beta blocker date and time   History of Anesthesia Complications Negative for: history of anesthetic  complications  Airway Mallampati: II  TM Distance: >3 FB Neck ROM: Full    Dental  (+) Caps, Dental Advisory Given   Pulmonary sleep apnea and Continuous Positive Airway Pressure Ventilation ,    breath sounds clear to auscultation       Cardiovascular hypertension, Pt. on medications and Pt. on home beta blockers (-) angina+ CAD, + Cardiac Stents and + CABG  + dysrhythmias (s/p Maze) Atrial Fibrillation and Ventricular Tachycardia + pacemaker (dual chamber Medtronic for CHB)  Rhythm:Regular Rate:Normal  '17 ECHO: EF 55-60%, valves OK   Neuro/Psych negative neurological ROS  negative psych ROS   GI/Hepatic negative GI ROS, Neg liver ROS,   Endo/Other  diabetes (glu 90), Oral Hypoglycemic AgentsMorbid obesity  Renal/GU Renal InsufficiencyRenal disease (creat 1.54)     Musculoskeletal  (+) Arthritis , Osteoarthritis,    Abdominal (+) + obese,   Peds  Hematology negative hematology ROS (+)   Anesthesia Other Findings   Reproductive/Obstetrics                             Anesthesia Physical Anesthesia Plan  ASA: III  Anesthesia Plan: General   Post-op Pain Management:    Induction: Intravenous  PONV Risk Score and Plan: 3 and Ondansetron and Dexamethasone  Airway Management Planned: Oral ETT  Additional Equipment:   Intra-op Plan:   Post-operative Plan: Extubation in OR  Informed Consent: I have reviewed the patients History and Physical, chart, labs and discussed the procedure including the risks, benefits and alternatives for the proposed anesthesia with the patient or authorized representative who has indicated his/her understanding and acceptance.   Dental advisory given  Plan Discussed with: CRNA and Surgeon  Anesthesia Plan Comments: (Plan routine monitors, GETA)        Anesthesia Quick Evaluation

## 2018-10-01 NOTE — Discharge Instructions (Signed)
Maintain sling until follow up.  Diet: As you were doing prior to hospitalization   Dressing:  Keep dressings on and dry.  You may remove dressings in 3 days and shower over incisions.  No Bath / submerging incisions.  Cover with clean Band-Aid.  Activity:  Increase activity slowly as tolerated, but follow the weight bearing instructions below.  The rules on driving is that you can not be taking narcotics while you drive, and you must feel in control of the vehicle.    Weight Bearing:  Do not lift or bear weight with affected arm.  You may straighten and bend arm at the elbow.  Pain:  For severe pain, you may increase breakthrough pain medication (oxycodone) for the first few days post op to 2 tablets every 4 hours.  Stop this medication as soon as you are able.  Constipation: Narcotic pain medications cause constipation.  Reduce use or stop taking if you become constipated.  Drink plenty of fluids (prune juice may be helpful) and high fiber foods.  You may use a stool softener such as -  Colace (over the counter) 100 mg by mouth twice a day  And/or Miralax (over the counter) for constipation as needed.    Itching:  If you experience itching with your medications, try taking only a single pain pill, or even half a pain pill at a time.  You can also use benadryl over the counter for itching or also to help with sleep.   Precautions:  If you experience chest pain or shortness of breath - call 911 immediately for transfer to the hospital emergency department!!  If you develop a fever greater that 101 F, purulent drainage from wound, increased redness or drainage from wound, or calf pain -- Call the office at [phone]                                                 Follow- Up Appointment:  Please call for an appointment to be seen in 2 weeks  - [phone]  Regional Anesthesia Blocks  1. Numbness or the inability to move the "blocked" extremity may last from 3-48 hours after  placement. The length of time depends on the medication injected and your individual response to the medication. If the numbness is not going away after 48 hours, call your surgeon.  2. The extremity that is blocked will need to be protected until the numbness is gone and the  Strength has returned. Because you cannot feel it, you will need to take extra care to avoid injury. Because it may be weak, you may have difficulty moving it or using it. You may not know what position it is in without looking at it while the block is in effect.  3. For blocks in the legs and feet, returning to weight bearing and walking needs to be done carefully. You will need to wait until the numbness is entirely gone and the strength has returned. You should be able to move your leg and foot normally before you try and bear weight or walk. You will need someone to be with you when you first try to ensure you do not fall and possibly risk injury.  4. Bruising and tenderness at the needle site are common side effects and will resolve in a few days.  5. Persistent numbness or  new problems with movement should be communicated to the surgeon or the [place] [phone] [place] [phone])   .Information for Discharge Teaching: EXPAREL (bupivacaine liposome injectable suspension)   Your surgeon gave you EXPAREL(bupivacaine) in your surgical incision to help control your pain after surgery.   EXPAREL is a local anesthetic that provides pain relief by numbing the tissue around the surgical site.  EXPAREL is designed to release pain medication over time and can control pain for up to 72 hours.  Depending on how you respond to EXPAREL, you may require less pain medication during your recovery.  Possible side effects:  Temporary loss of sensation or ability to move in the area where bupivacaine was injected.  Nausea, vomiting, constipation  Rarely, numbness and tingling in your  mouth or lips, lightheadedness, or anxiety may occur.  Call your doctor right away if you think you may be experiencing any of these sensations, or if you have other questions regarding possible side effects.  Follow all other discharge instructions given to you by your surgeon or nurse. Eat a healthy diet and drink plenty of water or other fluids.  If you return to the hospital for any reason within 96 hours following the administration of EXPAREL, please inform your health care providers.    Post Anesthesia Home Care Instructions  Activity: Get plenty of rest for the remainder of the day. A responsible individual must stay with you for 24 hours following the procedure.  For the next 24 hours, DO NOT: -Drive a car -Paediatric nurse -Drink alcoholic beverages -Take any medication unless instructed by your physician -Make any legal decisions or sign important papers.  Meals: Start with liquid foods such as gelatin or soup. Progress to regular foods as tolerated. Avoid greasy, spicy, heavy foods. If nausea and/or vomiting occur, drink only clear liquids until the nausea and/or vomiting subsides. Call your physician if vomiting continues.  Special Instructions/Symptoms: Your throat may feel dry or sore from the anesthesia or the breathing tube placed in your throat during surgery. If this causes discomfort, gargle with warm salt water. The discomfort should disappear within 24 hours.   Do not take any Tylenol until after 4:00 pm today.

## 2018-10-03 NOTE — Op Note (Signed)
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   ANESTHESIA:   General  PREOPERATIVE INDICATIONS:  Alan [name] is a  78 y.o. 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    The risks benefits and alternatives were discussed with the patient preoperatively including but not limited to the risks of infection, bleeding, nerve injury, cardiopulmonary complications, the need for revision surgery, among others, and the patient was willing to proceed.  OPERATIVE IMPLANTS: arthrex anchors  OPERATIVE FINDINGS: large Supra/infra tear. Biceps tendonitis  BLOOD LOSS: minimal  COMPLICATIONS: none  OPERATIVE PROCEDURE:  Patient was identified in the preoperative holding area and site was marked by me He was transported to the operating theater and placed on the table in beach chair position taking care to pad all bony prominences. After a preincinduction time out anesthesia was induced. The Left upper extremity was prepped and draped in normal sterile fashion and a pre-incision timeout was performed. Alan [name] received ancef for preoperative antibiotics.   Initially made a posterior arthroscopic portal and inserted the arthroscope into the glenohumeral joint. tour of the joint demonstrated the above operative findings  I created an anterior portal  just lateral to the coracoid under direct visualization using a spinal needle.  I performed an extensive debridement of the scarred synovial tissue and remaining structures  I performed a loop and tack biceps tenodesis to the superior bicipital groove  I then introduced the arthroscope into the subacromial space and brought the shaver into the anterior portal. I debrided the bursa for appropriate visualization.  I then performed a subacromial decompression using combination of the shaver ArthroCare and burr using a cutting block technique. As happy with the final elevation of the subacromial space on multiple portal views.   Next I turned my attention to the distal clavicle and through the anterior portal using the bur and shaver I was able to perform a distal clavicle excision. I then switched portals and inserted the arthroscope into the anterior portal and was happy with an appropriate resection of the distal clavicle.   I debrided the rotator cuff tear and examined its mobility. There was a large tear and I debrided the tear here. I then debrided the footprint to good bone for placement of the tendon. I placed three medial row loaded anchors next to the articular surface.  Next I placed the stitches through the tendon and crossed three to an anterior anchor and 3 to a posterior lateral row anchor  I was happy with the tendon apposition and there was minimal to no dog ear.  Next I removed all arthroscopic equipment expressed all fluid and closed the portals with a nylon stitch. A sterile dressing was applied the patient was taken the PACU in stable condition.  POST OPERATIVE PLAN: The patient will be in a sling full-time and keep the dressings clean dry and intact. DVT prophylaxis will consist of early ambulation

## 2018-10-04 ENCOUNTER — Encounter (HOSPITAL_BASED_OUTPATIENT_CLINIC_OR_DEPARTMENT_OTHER): Payer: Self-pay | Admitting: Orthopedic Surgery

## 2018-10-09 ENCOUNTER — Ambulatory Visit (INDEPENDENT_AMBULATORY_CARE_PROVIDER_SITE_OTHER): Payer: Medicare HMO | Admitting: *Deleted

## 2018-10-09 DIAGNOSIS — I442 Atrioventricular block, complete: Secondary | ICD-10-CM

## 2018-10-09 DIAGNOSIS — I495 Sick sinus syndrome: Secondary | ICD-10-CM

## 2018-10-10 LAB — CUP PACEART REMOTE DEVICE CHECK
Battery Impedance: 377 Ohm
Battery Remaining Longevity: 98 mo
Battery Voltage: 2.79 V
Brady Statistic AP VP Percent: 1 %
Brady Statistic AP VS Percent: 94 %
Brady Statistic AS VP Percent: 0 %
Brady Statistic AS VS Percent: 5 %
Date Time Interrogation Session: 20200902131252
Implantable Lead Implant Date: 20080208
Implantable Lead Implant Date: 20140130
Implantable Lead Location: 753859
Implantable Lead Location: 753860
Implantable Lead Model: 5076
Implantable Lead Model: 5076
Implantable Pulse Generator Implant Date: 20140130
Lead Channel Impedance Value: 474 Ohm
Lead Channel Impedance Value: 521 Ohm
Lead Channel Pacing Threshold Amplitude: 0.75 V
Lead Channel Pacing Threshold Amplitude: 0.75 V
Lead Channel Pacing Threshold Pulse Width: 0.4 ms
Lead Channel Pacing Threshold Pulse Width: 0.4 ms
Lead Channel Setting Pacing Amplitude: 2 V
Lead Channel Setting Pacing Amplitude: 2.5 V
Lead Channel Setting Pacing Pulse Width: 0.4 ms
Lead Channel Setting Sensing Sensitivity: 2.8 mV

## 2018-10-11 ENCOUNTER — Telehealth: Payer: Self-pay | Admitting: Physician Assistant

## 2018-10-11 NOTE — Telephone Encounter (Signed)
[name], PA spoke with Alan [name] today. Samples of Eliquis will be left at front desk along with 30 day free card for pt to pick up.  LOT #: [id number] EXP: APR 2022  3 BOXES

## 2018-10-11 NOTE — Telephone Encounter (Signed)
Received a phone call from Fonnie Jarvis who is the patient's primary care provider. (fax [phone]) apparently patient has been only been taking his Eliquis once a day instead of twice a day due to cost.  I have called the patient myself today, he says the cost of Eliquis is over $100 per month.  He has tried medication assistance application before and was not approved.  He says the cost of Eliquis is prohibitive for him at this point.  I have given him 3 boxes of samples obtained from [facility].  This should be enough for him for the next 3 weeks.  We discussed various options including Coumadin versus Xarelto versus Eliquis.  He is not a very good candidate for Coumadin therapy given history of gastric AVM and bleeding.  Eliquis likely has the lowest risk of GI bleed when compared to both Coumadin and Xarelto.  Medication assistance for Xarelto is limited at this time and likely will not be any less expensive than Eliquis.  Our clinical pharmacist Erasmo Downer will reach out to the [facility] representative to see if there is any other medication assistance foundation available to help with Mr. Hartt.  Unfortunately this is a difficult case and we will continue to do our best to provide assistance to the patient.

## 2018-10-11 NOTE — Telephone Encounter (Signed)
Alan [name] spoke with the pt again to let him know he will give samples to pt directly. Pt verbalized thanks.

## 2018-10-21 NOTE — Telephone Encounter (Signed)
Spoke with drug rep Alan [name].  PAN foundation is only other option he is aware of for assistance.  Checked the website and it is currently closed to patients with atrial fibrillation.  Unfortunately they open and close eligibility without any warning.  Would advise patient to check website (panfoundation.org) and click on "find a disease fund".  In the "A's" is arhythmia with atrial fibrillation.  They will have to watch to see when it lists this as "open" and apply.  These funds often open and are used up within a day or two, so they should check regularly.  In the meantime, rep will send some extra samples.   I have 4 weeks set aside in addition to the 3 weeks he just picked up.  If he can purchase every other month for remainder of 2020, Alan [name] will try to keep [place] supplied with samples.   The funds often open at the beginning of the new year.

## 2018-10-24 NOTE — Progress Notes (Signed)
Remote pacemaker transmission.   

## 2018-11-01 ENCOUNTER — Other Ambulatory Visit: Payer: Self-pay

## 2018-11-01 ENCOUNTER — Ambulatory Visit (INDEPENDENT_AMBULATORY_CARE_PROVIDER_SITE_OTHER): Payer: Medicare HMO

## 2018-11-01 DIAGNOSIS — Z23 Encounter for immunization: Secondary | ICD-10-CM | POA: Diagnosis not present

## 2018-11-06 NOTE — Progress Notes (Deleted)
Cardiology Office Note    Date:  11/06/2018   ID:  [name], [name], [name]  PCP:  Alan Noon, MD  Cardiologist:  Alan Klein, MD   No chief complaint on file.   History of Present Illness:  [name] is a 78 y.o. male with coronary artery disease, previous CABG, sinus node arrest, status post dual-chamber permanent pacemaker ("atrially dependent"), type 2 diabetes mellitus and dyslipidemia, obstructive sleep apnea on CPAP, here for pacemaker check.  His biggest problem has been severe lower back pain.  He is scheduled for a lumbar myelogram next week.  It is possible that he will need a complicated lumbar spine procedure that involves both anterior and posterior spine exposure.  This has caused him a lot of distress.  He has had an increased frequency of palpitations which he experiences as sudden strong thumps in his chest.  These are not associated with dizziness, shortness of breath or chest pain.  He has not experienced syncope.  They do appear to occur more during periods of emotional upset.  I could not find a correlation between the episodes of nonsustained VT recorded by his device and the reported palpitations.  He is already on a high dose of beta-blocker.  He has also had some chest tightness with activity, although this has been inconsistent.  He has to walk approximately 300 feet to his mailbox and on one occasion he had to stop on his way back because he simply could not go any longer", seemingly a combination of dyspnea and chest tightness.  However, he has subsequently been able to walk the same distance without similar complaints.  The patient specifically denies any chest pain at rest, dyspnea at rest or with exertion, orthopnea, paroxysmal nocturnal dyspnea, syncope, focal neurological deficits, intermittent claudication, lower extremity edema, unexplained weight gain, cough, hemoptysis or wheezing.  He has good glycemic control.  Most  recent LDL 95, not at target. We discussed PCSK9 inhibitors again today. He seems to be more receptive.  His pacemaker is functioning normally on a comprehensive check today.  The device was implanted in 2014 (Medtronic Adapta dual-chamber) and still has roughly 8.5 years of battery.  He is "atrially dependent" without any detectable atrial activity and with 98% atrial pacing.  He has normal AV conduction and has only about 2.2% ventricular pacing.  Heart rate histograms are favorable.  As before he has occasional episodes of nonsustained ventricular tachycardia, the longest being 16 beats duration.  Over the last many years he has had brief episodes of nonsustained ventricular tachycardia that occur about once or twice every 1 or 2 months.  Previous workup for ischemia in response to the VT did not lead to any abnormalities.  His device has not recorded any atrial fibrillation in several years.  He has a longstanding history of cardiac problems. He had CABG in 1990 (sequential LIMA to LAD and diagonal), multiple RCA PCI 1990-2004 and a redo CABG in 2004 (SVG-RCA-PDA, with surgical MAZE procedure, [name]). Cath 2007 and 2012 show occluded LAD and RCA with patent grafts, 60% ostial OM and diffuse stenoses distal LCX. 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 He had a generator changeout and ventricular lead revision in January 2014. He does not tolerate VVI pacing. He has had occasional  nonsustained VT recorded by the device, always asymptomatic. He has had paroxysmal atrial fibrillation in the remote past. No history of stroke or TIA. Warfarin was stopped for recurrent GI bleeding requiring transfusions. Despite stopping warfarin anticoagulation, he had another episode of GI bleeding requiring transfusion in April 2015. He was seen at [name] and underwent repeat endoscopy (endoscopic workup in February was negative). He tells me they found "2 small holes in his stomach" which were cauterized. He has not had any bleeding events since. He remains off anti-coagulation.  He wears CPAP for OSA, has insulin requiring type II DM, mild dyslipidemia intolerant to all statins, post-polio syndrome, lumbar spine surgery 01/2012.  He does not trust nuclear stress tests - reports they were repeatedly wrong in the past. [name] told him he should always have a cath rather than a stress test.    Past Medical History:  Diagnosis Date  . Anticoagulant long-term use    eliquis  . Arthritis   . Arthropathy of left shoulder   . Biceps tendonitis, left   . Bursitis of left shoulder   . Cardiac pacemaker in situ    first insertion 2008;  genertor and new lead change 03-08-2012;   Medtronic  . CHB (complete heart block) ([place])    s/p  PPM 2008  . Chronic back pain   . CKD (chronic kidney disease), stage III ([place])   . Coronary artery disease cardiologist--- dr [name]   hx  CABG x2  1990 and re-do 2004;  multiple PCI to [place] to 2004;    cardiac cath 2012  occluded LAD and RCA with patent grafts  . History of coronary angioplasty    multiple PCI to RCA ,  1990 to 2004  . History of GI bleed followed by GI-- Alan Perches PA @ [facility] in [place]   01/ 2020  upper and lower GI bleed ;  s/p  EGD with cautery of jejunal bleed and blood transfusion's  . History of kidney stones   . Hx of echocardiogram 06/29/2008   EF 45-50%   . Hypertension   . Ischemic cardiomyopathy    previously reported , ef 40-45%;  2010--- ef 45-50%;   last echo 2017 55-60%  . Mixed hyperlipidemia   . Nocturia   . OSA on CPAP    cpap, 12, last sleep study Jan2013, sees Dr. Keturah [name]  . PAF (paroxysmal atrial fibrillation) (HCC)    long hx PAF--- followed by dr [name]  . Post-polio syndrome    dx polio age 55;   09-30-2018  per pt  occasional gait issues and occasion fall  . Rotator cuff tear, left   . S/P CABG x 2    1990 x2  and 2004  x2  . Sinus node dysfunction ([place])    2015--- sinus node arrest  with intact AV conduction  . Type 2 diabetes mellitus ([place])    followed by pcp    Past Surgical History:  Procedure Laterality Date  . APPENDECTOMY  child  . BLEPHAROPLASTY Bilateral 12/2014   upper eyelid  . CARDIAC PACEMAKER PLACEMENT  2008  . CARPAL TUNNEL RELEASE Bilateral 2015;  2016  . CATARACT EXTRACTION W/ INTRAOCULAR LENS  IMPLANT, BILATERAL  2017  . CORONARY ANGIOPLASTY  1990  to 2004   multiple to RCA  . CORONARY ARTERY BYPASS GRAFT  1990    @ [place] (dr chitwood)   seq. LIMA to LAD and Diagonal  . CORONARY  ARTERY BYPASS GRAFT  2004     dr [name]   SVG to  RCA and PDA;  ALSO MAZE  PROCEDURE  . ESOPHAGOGASTRODUODENOSCOPY Left 03/18/2013   Procedure: ESOPHAGOGASTRODUODENOSCOPY (EGD);  Surgeon: Cleotis Nipper, MD;  Location: [facility] ENDOSCOPY;  Service: Endoscopy;  Laterality: Left;  . HARDWARE REMOVAL N/A 05/18/2016   Procedure: Removal of broken hardware Lumbar three-four;  Surgeon: [name], MD;  Location: [place];  Service: Neurosurgery;  Laterality: N/A;  . HERNIA REPAIR    . LEFT HEART CATHETERIZATION WITH CORONARY/GRAFT ANGIOGRAM N/A 12/21/2010   Procedure: LEFT HEART CATHETERIZATION WITH Beatrix Fetters;  Surgeon: Leonie Man, MD;  Location: [facility] CATH LAB;  Service: Cardiovascular;  Laterality: N/A;  . LEG SURGERY Right 2002  approx.   lengthened his leg  . LUMBAR LAMINECTOMY/DECOMPRESSION MICRODISCECTOMY  01/10/2012   Procedure: LUMBAR LAMINECTOMY/DECOMPRESSION MICRODISCECTOMY 1 LEVEL;  Surgeon: [name], MD;  Location: [facility] NEURO ORS;  Service: Neurosurgery;  Laterality: Bilateral;  Lumbar three-four decompression, Posterior lateral fusion lumbar three-four, posterior spinus plate lumbar three-four  . LUMBAR LAMINECTOMY/DECOMPRESSION MICRODISCECTOMY Left 05/18/2016    Procedure: Laminectomy and Foraminotomy - Lumbar two-lumbar three- Lumbar four-lumbar five- left with removal hardware lumbar three-four;  Surgeon: [name], MD;  Location: [place];  Service: Neurosurgery;  Laterality: Left;  . NASAL SEPTUM SURGERY  yrs ago  . PACEMAKER REVISION N/A 03/07/2012   Procedure: PACEMAKER REVISION;  Surgeon: Alan Klein, MD;  Location: [facility] CATH LAB;  Service: Cardiovascular;  Laterality: N/A;  . POSTERIOR LUMBAR FUSION  02-22-2017   dr Ronnald Ramp  @ [facility]   re-do laminectomy L2-3 and fusion  . POSTERIOR LUMBAR FUSION  03-11-2018   dr Ronnald Ramp @ [facility]   laminectomy L4-5;  fixation L2-S1 and fusion L3-S1  . SHOULDER ARTHROSCOPY Right after 2016, pt unsure year  . SHOULDER ARTHROSCOPY WITH OPEN ROTATOR CUFF REPAIR Right 03/30/2014   Procedure: RIGHT SHOULDER ARTHROSCOPY  OPEN ROTATOR CUFF REPAIR;  Surgeon: Kerin Salen, MD;  Location: [place] [facility];  Service: Orthopedics;  Laterality: Right;  . SHOULDER ARTHROSCOPY WITH SUBACROMIAL DECOMPRESSION, ROTATOR CUFF REPAIR AND BICEP TENDON REPAIR Left 10/01/2018   Procedure: LEFT SHOULDER ARTHROSCOPY, DISTAL CLAVICLE EXCISION,SUBACROMIAL, DECOMPRESSION, RORATOR CUFF REPAIR, BICEPS TENODESIS;  Surgeon: Renette Butters, MD;  Location: [place];  Service: Orthopedics;  Laterality: Left;  . SHOULDER OPEN ROTATOR CUFF REPAIR Right 03/30/2014   Procedure: ROTATOR CUFF REPAIR SHOULDER OPEN;  Surgeon: Kerin Salen, MD;  Location: [place];  Service: Orthopedics;  Laterality: Right;  . TONSILLECTOMY  child  . TOTAL KNEE ARTHROPLASTY Left 2006    Current Medications: Outpatient Medications Prior to Visit  Medication Sig Dispense Refill  . amLODipine (NORVASC) 10 MG tablet Take 10 mg by mouth daily.     [name] Kitchen apixaban (ELIQUIS) 5 MG TABS tablet Take 1 tablet (5 mg total) by mouth 2 (two) times daily. 60 tablet 3  . Ascorbic Acid (VITAMIN C PO) Take 1 tablet by mouth daily.    . baclofen (LIORESAL) 10 MG tablet Take 1 tablet (10 mg total) by  mouth 3 (three) times daily as needed for muscle spasms. 40 each 0  . CALCIUM PO Take 1 tablet by mouth 2 (two) times a day.    . carboxymethylcellulose (REFRESH PLUS) 0.5 % SOLN Place 1 drop into both eyes every morning.    . carvedilol (COREG) 25 MG tablet Take 2 tablets by mouth twice daily (Patient taking differently: Take 50 mg by mouth 2 (  two) times daily with a meal. ) 360 tablet 3  . diclofenac sodium (VOLTAREN) 1 % GEL Apply 2 g topically daily as needed (pain).     . ferrous sulfate 325 (65 FE) MG tablet Take 325 mg by mouth 2 (two) times a day.     . fluticasone (FLONASE) 50 MCG/ACT nasal spray Place 2 sprays into both nostrils daily as needed for allergies.     [name] Kitchen glipiZIDE (GLUCOTROL) 10 MG tablet Take 10 mg by mouth 2 (two) times daily.     . hydrochlorothiazide (HYDRODIURIL) 25 MG tablet Take 25 mg by mouth daily.     . Lancets (FREESTYLE) lancets Use to check blood sugar 4 time(s) daily.    [name] Kitchen losartan (COZAAR) 100 MG tablet Take 100 mg by mouth daily.     . metFORMIN (GLUCOPHAGE) 500 MG tablet Take 500 mg by mouth 2 (two) times a day.     . metFORMIN (GLUCOPHAGE-XR) 500 MG 24 hr tablet Take 500 mg by mouth 2 (two) times a day.     . Multiple Vitamin (MULTIVITAMIN WITH MINERALS) TABS tablet Take 1 tablet by mouth daily.    . ondansetron (ZOFRAN) 4 MG tablet Take 1 tablet (4 mg total) by mouth every 8 (eight) hours as needed for nausea or vomiting. 20 tablet 0   No facility-administered medications prior to visit.      Allergies:   Patient has no known allergies.   Social History   Socioeconomic History  . Marital status: Single    Spouse name: Not on file  . Number of children: 1  . Years of education: 71  . Highest education level: Not on file  Occupational History  . Occupation: pilot-retired  Social Needs  . Financial resource strain: Not on file  . Food insecurity    Worry: Not on file    Inability: Not on file  . Transportation needs    Medical: Not on file     Non-medical: Not on file  Tobacco Use  . Smoking status: Never Smoker  . Smokeless tobacco: Never Used  Substance and Sexual Activity  . Alcohol use: Yes    Comment: rarely  . Drug use: Never  . Sexual activity: Yes  Lifestyle  . Physical activity    Days per week: Not on file    Minutes per session: Not on file  . Stress: Not on file  Relationships  . Social Herbalist on phone: Not on file    Gets together: Not on file    Attends religious service: Not on file    Active member of club or organization: Not on file    Attends meetings of clubs or organizations: Not on file    Relationship status: Not on file  Other Topics Concern  . Not on file  Social History Narrative   [place] [facility], fishes.    Right-handed.   1 cup caffeine per day.     Family History:  The patient's family history includes Cancer in his mother; Other in his father.   ROS:   Please see the history of present illness.    ROS All other systems are reviewed and are negative.  PHYSICAL EXAM:   VS:  There were no vitals taken for this visit.      General: Alert, oriented x3, no distress, ***.  Healthy left subclavian pacemaker site. Head: no evidence of trauma, PERRL, EOMI, no exophtalmos or lid  lag, no myxedema, no xanthelasma; normal ears, nose and oropharynx Neck: normal jugular venous pulsations and no hepatojugular reflux; brisk carotid pulses without delay and no carotid bruits Chest: clear to auscultation, no signs of consolidation by percussion or palpation, normal fremitus, symmetrical and full respiratory excursions Cardiovascular: normal position and quality of the apical impulse, regular rhythm, normal first and second heart sounds, no murmurs, rubs or gallops Abdomen: no tenderness or distention, no masses by palpation, no abnormal pulsatility or arterial bruits, normal bowel sounds, no hepatosplenomegaly Extremities: no clubbing, cyanosis or edema; 2+ radial,  ulnar and brachial pulses bilaterally; 2+ right femoral, posterior tibial and dorsalis pedis pulses; 2+ left femoral, posterior tibial and dorsalis pedis pulses; no subclavian or femoral bruits Neurological: grossly nonfocal Psych: Normal mood and affect   Wt Readings from Last 3 Encounters:  10/01/18 216 lb 6.4 oz (98.2 kg)  08/26/18 221 lb 6.4 oz (100.4 kg)  07/25/18 208 lb (94.3 kg)     Studies/Labs Reviewed:   EKG:  EKG is ordered today.  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  Recent Labs: 08/01/2018: BUN 30; Creatinine, Ser 1.55; Hemoglobin 11.1; Platelets 159; Potassium 4.9; Sodium 139  10/30/2017 Creat 1.34, GFR 51. CO2 19, K 5.1. 10/16/2017 Hgb A1c 6.5% Lipid Panel  Dr. Melford Aase, December 2018 Total cholesterol 162, triglycerides 1 7, HDL, LDL 95 Creatinine 1.45, hemoglobin 11.3  ASSESSMENT:    1. Coronary artery disease of bypass graft of native heart with stable angina pectoris ([place])   2. PAF (paroxysmal atrial fibrillation) ([place])   3. SSS (sick sinus syndrome) ([place])   4. NSVT (nonsustained ventricular tachycardia) ([place])   5. Pacemaker - Medtronic Adapta dual chamber   6. Essential hypertension   7. Diabetes mellitus type 2 in obese ([place])   8. Hypercholesterolemia   9. Mild obesity      PLAN:  In order of problems listed above:  1. CAD s/p redo CABG with exertional angina: It seems he is describing exertional angina, while receiving  2 antianginals (high-dose carvedilol and a maximum dose of amlodipine).  However the symptoms are not always predictably present.  He has no faith nuclear stress test.  It is probably worth repeating a coronary angiography, but we discussed the fact that percutaneous revascularization could significantly delay his spinal procedures.  I think he should go ahead with his lumbar myelogram first.  However, if he  should need a complicated lumbar spine surgical procedure that includes mobilization of the abdominal-pelvic great vessels, he should probably have coronary angiography first.  Depending on the findings on the angiogram, we may have to decide whether or not revascularization should happen first (since this could delay his lumbar spine surgery by as much as 12 months). 2. AFib: No recurrence by pacemaker check in several years. CHADSVasc score 4 (Age, vascular disease, diabetes mellitus). Anticoagulation has been withheld due to serious GI bleeding. Continue to monitor for recurrence of arrhythmia via his pacemaker. 3. Sinus node arrest/CHB: Reportedly his device was implanted for complete heart block, he is but he really has a sinus node arrest and he is "atrially dependent".  Appropriate heart rate histogram distribution. 4. NSVT: Episodes are infrequent, a few times a year, consistently asymptomatic.  They do not appear to correlate with his current complaints of palpitations, which sound like they are related to isolated PVCs. 5. PPM: Continue with remote downloads every  3 months and office visit in 6 months.  Normal device function. 6. HTN:  Target systolic blood pressure less than 130, which is most often is the case.  His back is hurting him today which may be driving up his blood pressure.  No change to meds today. 7. DM: Last A1c 6.4%, remains mildly obese. 8. HLP: Intolerant to ever single available statin. Discussed PCSK9 inhibitors today.  He was more receptive today and I think Repatha might be a great drug for him to try.  Recheck lipids.  Try 9. Obesity: Down 10 lb in first 6 months of this year, but he has regained for 5 pounds. If he wants to be on fewer meds for DM and HTN, try to lose even more weight.    Medication Adjustments/Labs and Tests Ordered: Current medicines are reviewed at length with the patient today.  Concerns regarding medicines are outlined above.  Medication changes,  Labs and Tests ordered today are listed in the Patient Instructions below. There are no Patient Instructions on file for this visit.   Signed, Alan Klein, MD  11/06/2018 5:37 PM    [facility] [place], [place],   [address] Phone: [phone]; Fax: [phone]

## 2018-11-11 ENCOUNTER — Encounter: Payer: Medicare HMO | Admitting: Cardiovascular Disease

## 2018-11-28 ENCOUNTER — Other Ambulatory Visit: Payer: Self-pay

## 2018-12-16 ENCOUNTER — Other Ambulatory Visit: Payer: Self-pay | Admitting: *Deleted

## 2018-12-16 ENCOUNTER — Encounter: Payer: Self-pay | Admitting: Cardiovascular Disease

## 2018-12-16 ENCOUNTER — Other Ambulatory Visit (HOSPITAL_COMMUNITY)
Admission: RE | Admit: 2018-12-16 | Discharge: 2018-12-16 | Disposition: A | Discharge status: Home / Self Care | Payer: Medicare HMO | Source: Ambulatory Visit | Attending: Cardiovascular Disease | Admitting: Cardiovascular Disease

## 2018-12-16 ENCOUNTER — Ambulatory Visit (INDEPENDENT_AMBULATORY_CARE_PROVIDER_SITE_OTHER): Payer: Medicare HMO | Admitting: Cardiovascular Disease

## 2018-12-16 ENCOUNTER — Telehealth: Payer: Self-pay | Admitting: Cardiology

## 2018-12-16 ENCOUNTER — Other Ambulatory Visit: Payer: Self-pay

## 2018-12-16 VITALS — BP 146/76 | HR 70 | Ht 69.5 in | Wt 225.0 lb

## 2018-12-16 DIAGNOSIS — I495 Sick sinus syndrome: Secondary | ICD-10-CM

## 2018-12-16 DIAGNOSIS — Z01812 Encounter for preprocedural laboratory examination: Secondary | ICD-10-CM | POA: Diagnosis present

## 2018-12-16 DIAGNOSIS — E669 Obesity, unspecified: Secondary | ICD-10-CM

## 2018-12-16 DIAGNOSIS — I4729 Other ventricular tachycardia: Secondary | ICD-10-CM

## 2018-12-16 DIAGNOSIS — Z20828 Contact with and (suspected) exposure to other viral communicable diseases: Secondary | ICD-10-CM | POA: Insufficient documentation

## 2018-12-16 DIAGNOSIS — Z950 Presence of cardiac pacemaker: Secondary | ICD-10-CM

## 2018-12-16 DIAGNOSIS — I25708 Atherosclerosis of coronary artery bypass graft(s), unspecified, with other forms of angina pectoris: Secondary | ICD-10-CM

## 2018-12-16 DIAGNOSIS — I472 Ventricular tachycardia: Secondary | ICD-10-CM

## 2018-12-16 DIAGNOSIS — R0602 Shortness of breath: Secondary | ICD-10-CM

## 2018-12-16 DIAGNOSIS — I480 Paroxysmal atrial fibrillation: Secondary | ICD-10-CM

## 2018-12-16 DIAGNOSIS — E1169 Type 2 diabetes mellitus with other specified complication: Secondary | ICD-10-CM

## 2018-12-16 DIAGNOSIS — I10 Essential (primary) hypertension: Secondary | ICD-10-CM

## 2018-12-16 DIAGNOSIS — R079 Chest pain, unspecified: Secondary | ICD-10-CM

## 2018-12-16 DIAGNOSIS — E785 Hyperlipidemia, unspecified: Secondary | ICD-10-CM

## 2018-12-16 LAB — CBC
Hematocrit: 34.1 % — ABNORMAL LOW (ref 37.5–51.0)
Hemoglobin: 11 g/dL — ABNORMAL LOW (ref 13.0–17.7)
MCH: 30.6 pg (ref 26.6–33.0)
MCHC: 32.3 g/dL (ref 31.5–35.7)
MCV: 95 fL (ref 79–97)
NRBC: 1 % — ABNORMAL HIGH (ref 0–0)
Platelets: 152 10*3/uL (ref 150–450)
RBC: 3.6 x10E6/uL — ABNORMAL LOW (ref 4.14–5.80)
RDW: 12.4 % (ref 11.6–15.4)
WBC: 6.2 10*3/uL (ref 3.4–10.8)

## 2018-12-16 LAB — BASIC METABOLIC PANEL
BUN/Creatinine Ratio: 24 (ref 10–24)
BUN: 32 mg/dL — ABNORMAL HIGH (ref 8–27)
CO2: 19 mmol/L — ABNORMAL LOW (ref 20–29)
Calcium: 9.5 mg/dL (ref 8.6–10.2)
Chloride: 104 mmol/L (ref 96–106)
Creatinine, Ser: 1.36 mg/dL — ABNORMAL HIGH (ref 0.76–1.27)
GFR calc Af Amer: 58 mL/min/{1.73_m2} — ABNORMAL LOW (ref >59)
GFR calc non Af Amer: 50 mL/min/{1.73_m2} — ABNORMAL LOW (ref >59)
Glucose: 209 mg/dL — ABNORMAL HIGH (ref 65–99)
Potassium: 5.8 mmol/L (ref 3.5–5.2)
Sodium: 135 mmol/L (ref 134–144)

## 2018-12-16 LAB — SARS CORONAVIRUS 2 (TAT 6-24 HRS): SARS Coronavirus 2: NEGATIVE

## 2018-12-16 MED ORDER — SODIUM CHLORIDE 0.9% FLUSH: 3.0000 mL | Freq: Two times a day (BID) | INTRAVENOUS | Status: DC

## 2018-12-16 NOTE — Telephone Encounter (Signed)
Recheck on arrival for cath. Suspect artifact due to hemolysis

## 2018-12-16 NOTE — Patient Instructions (Addendum)
Medication Instructions:  No changes *If you need a refill on your cardiac medications before your next appointment, please call your pharmacy*  Lab Work: Your provider would like for you to have the following labs today: CBC and CMET  If you have labs (blood work) drawn today and your tests are completely normal, you will receive your results only by: [name] Kitchen MyChart Message (if you have MyChart) OR . A paper copy in the mail If you have any lab test that is abnormal or we need to change your treatment, we will call you to review the results.  Testing/Procedures: Your physician has requested that you have a cardiac catheterization. Cardiac catheterization is used to diagnose and/or treat various heart conditions. Doctors may recommend this procedure for a number of different reasons. The most common reason is to evaluate chest pain. Chest pain can be a symptom of coronary artery disease (CAD), and cardiac catheterization can show whether plaque is narrowing or blocking your heart's arteries. This procedure is also used to evaluate the valves, as well as measure the blood flow and oxygen levels in different parts of your heart. For further information please visit [email or web address][facility]tn. Please follow instruction sheet, as given.  Your physician has requested that you have an echocardiogram. Echocardiography is a painless test that uses sound waves to create images of your heart. It provides your doctor with information about the size and shape of your heart and how well your heart's chambers and valves are working. You may receive an ultrasound enhancing agent through an IV if needed to better visualize your heart during the echo.This procedure takes approximately one hour. There are no restrictions for this procedure. This will take place at the [address].    Follow-Up: At [facility], you and your health needs are our priority.  As part of our continuing mission to provide you with  exceptional heart care, we have created designated Provider Care Teams.  These Care Teams include your primary Cardiologist (physician) and Advanced Practice Providers (APPs -  Physician Assistants and Nurse Practitioners) who all work together to provide you with the care you need, when you need it.  Your next appointment:   In 2-3 weeks with Dr. Sallyanne Kuster (in office)  Other Instructions    [address] [place] [address] Dept: [phone] Loc: [place]  12/16/2018  You are scheduled for a Cardiac Catheterization on Wednesday, November 11 with Dr. Kathlyn [place].  1. Please arrive at the [facility] (Main Entrance A) at [facility]: [address] [place], [place] [address] at 10:30 AM (This time is two hours before your procedure to ensure your preparation). Free valet parking service is available.   Special note: Every effort is made to have your procedure done on time. Please understand that emergencies sometimes delay scheduled procedures.  2. Diet: Do not eat solid foods after midnight.  The patient may have clear liquids until 5am upon the day of the procedure.  3. Labs:  You will need to have the coronavirus test completed prior to your procedure. An appointment has been made at 10:20 on 12/16/2018. This is a Drive Up Visit at the [employer] [address] Someone will direct you to the appropriate testing line. Please tell them that you are there for procedure testing. Stay in your car and someone will be with you shortly. Please  make sure to have all other labs completed before this test because you will need to stay quarantined until your procedure.  4. Medication instructions in preparation for your procedure: Hold the Hydrochlorothiazide the morning of the procedure Hold the Metformin the morning of the procedure and then 48 hours  after. Hold all diabetic medication the morning of the procedure (Patient not taking Eliquis)  On the morning of your procedure, take your Aspirin and any morning medicines NOT listed above.  You may use sips of water.  5. Plan for one night stay--bring personal belongings. 6. Bring a current list of your medications and current insurance cards. 7. You MUST have a responsible person to drive you home. 8. Someone MUST be with you the first 24 hours after you arrive home or your discharge will be delayed. 9. Please wear clothes that are easy to get on and off and wear slip-on shoes.  Thank you for allowing [place] to care for you!   -- [facility]

## 2018-12-16 NOTE — H&P (View-Only) (Signed)
Cardiology Office Note    Date:  12/16/2018   ID:  Alan [name], DOB 1940-10-28, MRN [id number]  PCP:  [name], MD  Cardiologist:  Sanda Klein, MD   Chief Complaint  Patient presents with  . Atrial Fibrillation  . Coronary Artery Disease  . Pacemaker Check    History of Present Illness:  Alan [name] is a 78 y.o. male with coronary artery disease, previous CABG, sinus node arrest, status post dual-chamber permanent pacemaker ("atrially dependent"), type 2 diabetes mellitus and dyslipidemia, obstructive sleep apnea on CPAP, here for pacemaker check.  He had uncomplicated shoulder surgery since his last appointment. He has stopped taking Eliquis due to the cost (not due to side effects). He is not taking anticoagulation. He took warfarin in the past, stopped after GI bleeding event.  He has been feeling poorly  (dyspnea with walking in the house, less prominent exertional chest tightness) for the last 2 months. This seems to correlate with recurrent atrial fibrillation, starting September 9 and persistent since then. Ventricular rate is well controlled.  We had discussed a cardiac cath for similar symptoms last year, before his lumbar myelogram. He seemed to improve spontaneously.  Denies edema, syncope, palpitations, falls, bleeding events.  Reports good glycemic control.  His pacemaker is functioning normally on a comprehensive check today.  The device was implanted in 2014 (Medtronic Adapta dual-chamber) and still has roughly 8 years of battery longevity.  He is "atrially dependent" without any detectable atrial activity and with 98% atrial pacing before onset of AFib (now w 47% AFib in last 6 months).  Ventricular pacing has increased during the AFib, from 2% to 21%.    He has a longstanding history of cardiac problems. He had CABG in 1990 (sequential LIMA to LAD and diagonal), multiple RCA PCI 1990-2004 and a redo CABG in 2004 (SVG-RCA-PDA, with surgical MAZE  procedure, Dr. Amador Cunas). Cath 2007 and 2012 show occluded LAD and RCA with patent grafts, 60% ostial OM and diffuse stenoses distal LCX. 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 He had a generator changeout and ventricular lead revision in January 2014. He does not tolerate VVI pacing. He has had occasional nonsustained VT recorded by the device, always asymptomatic. He has had paroxysmal atrial fibrillation in the remote past. No history of stroke or TIA. Warfarin was stopped for recurrent GI bleeding requiring transfusions. Despite stopping warfarin anticoagulation, he had another episode of GI bleeding requiring transfusion in April 2015. He was seen at [facility] and underwent repeat endoscopy (endoscopic workup in February was negative). He tells me they found "2 small holes in his stomach" which were cauterized. He has not had any bleeding events since. He remains off anti-coagulation.  He wears CPAP for OSA, has insulin requiring type II DM, mild dyslipidemia intolerant to all statins, post-polio syndrome, lumbar spine surgery 01/2012.  He does not trust nuclear stress tests - reports they were repeatedly wrong in the past. [name] told him he should always have a cath rather than a stress test.    Past Medical History:  Diagnosis Date  . Anticoagulant long-term use    eliquis  . Arthritis   . Arthropathy of left shoulder   . Biceps tendonitis, left   . Bursitis of left shoulder   . Cardiac pacemaker  in situ    first insertion 2008;  genertor and new lead change 03-08-2012;   Medtronic  . CHB (complete heart block) ([place])    s/p  PPM 2008  . Chronic back pain   . CKD (chronic kidney disease), stage III   . Coronary artery disease cardiologist--- dr Dovey Fatzinger   hx  CABG x2  1990 and re-do 2004;  multiple PCI to [place]  to 2004;    cardiac cath 2012  occluded LAD and RCA with patent grafts  . History of coronary angioplasty    multiple PCI to RCA ,  1990 to 2004  . History of GI bleed followed by GI-- Olevia Perches PA @ [facility] in [place]   01/ 2020  upper and lower GI bleed ;  s/p  EGD with cautery of jejunal bleed and blood transfusion's  . History of kidney stones   . Hx of echocardiogram 06/29/2008   EF 45-50%   . Hypertension   . Ischemic cardiomyopathy    previously reported , ef 40-45%;  2010--- ef 45-50%;   last echo 2017 55-60%  . Mixed hyperlipidemia   . Nocturia   . OSA on CPAP    cpap, 12, last sleep study Jan2013, sees Dr. Keturah Barre  . PAF (paroxysmal atrial fibrillation) (HCC)    long hx PAF--- followed by dr Yoshino Broccoli  . Post-polio syndrome    dx polio age 65;   09-30-2018  per pt occasional gait issues and occasion fall  . Rotator cuff tear, left   . S/P CABG x 2    1990 x2  and 2004  x2  . Sinus node dysfunction ([place])    2015--- sinus node arrest  with intact AV conduction  . Type 2 diabetes mellitus ([place])    followed by pcp    Past Surgical History:  Procedure Laterality Date  . APPENDECTOMY  child  . BLEPHAROPLASTY Bilateral 12/2014   upper eyelid  . CARDIAC PACEMAKER PLACEMENT  2008  . CARPAL TUNNEL RELEASE Bilateral 2015;  2016  . CATARACT EXTRACTION W/ INTRAOCULAR LENS  IMPLANT, BILATERAL  2017  . CORONARY ANGIOPLASTY  1990  to 2004   multiple to RCA  . CORONARY ARTERY BYPASS GRAFT  1990    @ [place] (dr chitwood)   seq. LIMA to LAD and Diagonal  . CORONARY ARTERY BYPASS GRAFT  2004     dr Amador Cunas   SVG to  RCA and PDA;  ALSO MAZE  PROCEDURE  . ESOPHAGOGASTRODUODENOSCOPY Left 03/18/2013   Procedure: ESOPHAGOGASTRODUODENOSCOPY (EGD);  Surgeon: Cleotis Nipper, MD;  Location[facility] ENDOSCOPY;  Service: Endoscopy;  Laterality: Left;  . HARDWARE REMOVAL N/A 05/18/2016   Procedure: Removal of broken hardware Lumbar three-four;  Surgeon: [name],  MD;  Location: [place];  Service: Neurosurgery;  Laterality: N/A;  . HERNIA REPAIR    . LEFT HEART CATHETERIZATION WITH CORONARY/GRAFT ANGIOGRAM N/A 12/21/2010   Procedure: LEFT HEART CATHETERIZATION WITH Beatrix Fetters;  Surgeon: Leonie Man, MD;  Location: [facility] CATH LAB;  Service: Cardiovascular;  Laterality: N/A;  . LEG SURGERY Right 2002  approx.   lengthened his leg  . LUMBAR LAMINECTOMY/DECOMPRESSION MICRODISCECTOMY  01/10/2012   Procedure: LUMBAR LAMINECTOMY/DECOMPRESSION MICRODISCECTOMY 1 LEVEL;  Surgeon: [name], MD;  Location: [place] NEURO ORS;  Service: Neurosurgery;  Laterality: Bilateral;  Lumbar three-four decompression, Posterior lateral fusion lumbar three-four, posterior spinus plate lumbar three-four  . LUMBAR LAMINECTOMY/DECOMPRESSION MICRODISCECTOMY Left 05/18/2016   Procedure: Laminectomy and  Foraminotomy - Lumbar two-lumbar three- Lumbar four-lumbar five- left with removal hardware lumbar three-four;  Surgeon: [name], MD;  Location: [place];  Service: Neurosurgery;  Laterality: Left;  . NASAL SEPTUM SURGERY  yrs ago  . PACEMAKER REVISION N/A 03/07/2012   Procedure: PACEMAKER REVISION;  Surgeon: Sanda Klein, MD;  Location: [place] CATH LAB;  Service: Cardiovascular;  Laterality: N/A;  . POSTERIOR LUMBAR FUSION  02-22-2017   dr Ronnald Ramp  @ [facility]   re-do laminectomy L2-3 and fusion  . POSTERIOR LUMBAR FUSION  03-11-2018   dr Ronnald Ramp @ [facility]   laminectomy L4-5;  fixation L2-S1 and fusion L3-S1  . SHOULDER ARTHROSCOPY Right after 2016, pt unsure year  . SHOULDER ARTHROSCOPY WITH OPEN ROTATOR CUFF REPAIR Right 03/30/2014   Procedure: RIGHT SHOULDER ARTHROSCOPY  OPEN ROTATOR CUFF REPAIR;  Surgeon: Kerin Salen, MD;  Location: [place];  Service: Orthopedics;  Laterality: Right;  . SHOULDER ARTHROSCOPY WITH SUBACROMIAL DECOMPRESSION, ROTATOR CUFF REPAIR AND BICEP TENDON REPAIR Left 10/01/2018   Procedure: LEFT SHOULDER ARTHROSCOPY, DISTAL CLAVICLE EXCISION,SUBACROMIAL, DECOMPRESSION,  RORATOR CUFF REPAIR, BICEPS TENODESIS;  Surgeon: Renette Butters, MD;  Location: [place];  Service: Orthopedics;  Laterality: Left;  . SHOULDER OPEN ROTATOR CUFF REPAIR Right 03/30/2014   Procedure: ROTATOR CUFF REPAIR SHOULDER OPEN;  Surgeon: Kerin Salen, MD;  Location: [place];  Service: Orthopedics;  Laterality: Right;  . TONSILLECTOMY  child  . TOTAL KNEE ARTHROPLASTY Left 2006    Current Medications: Outpatient Medications Prior to Visit  Medication Sig Dispense Refill  . amLODipine (NORVASC) 10 MG tablet Take 10 mg by mouth daily.     [name] Kitchen apixaban (ELIQUIS) 5 MG TABS tablet Take 1 tablet (5 mg total) by mouth 2 (two) times daily. (Patient not taking: Reported on 12/16/2018) 60 tablet 3  . baclofen (LIORESAL) 10 MG tablet Take 1 tablet (10 mg total) by mouth 3 (three) times daily as needed for muscle spasms. (Patient not taking: Reported on 12/16/2018) 40 each 0  . carboxymethylcellulose (REFRESH PLUS) 0.5 % SOLN Place 1 drop into both eyes every morning.    . carvedilol (COREG) 25 MG tablet Take 2 tablets by mouth twice daily (Patient taking differently: Take 50 mg by mouth 2 (two) times daily with a meal. ) 360 tablet 3  . diclofenac sodium (VOLTAREN) 1 % GEL Apply 2 g topically daily as needed (pain).     . fluticasone (FLONASE) 50 MCG/ACT nasal spray Place 2 sprays into both nostrils daily as needed for allergies.     [name] Kitchen glipiZIDE (GLUCOTROL) 10 MG tablet Take 10 mg by mouth 2 (two) times daily.     . hydrochlorothiazide (HYDRODIURIL) 25 MG tablet Take 25 mg by mouth daily.     . Lancets (FREESTYLE) lancets Use to check blood sugar 4 time(s) daily.    [name] Kitchen losartan (COZAAR) 100 MG tablet Take 100 mg by mouth daily.     . metFORMIN (GLUCOPHAGE) 500 MG tablet Take 500 mg by mouth 2 (two) times a day.     . metFORMIN (GLUCOPHAGE-XR) 500 MG 24 hr tablet Take 500 mg by mouth 2 (two) times a day.     . Multiple Vitamin (MULTIVITAMIN WITH MINERALS) TABS tablet Take 1 tablet by  mouth daily.    . ondansetron (ZOFRAN) 4 MG tablet Take 1 tablet (4 mg total) by mouth every 8 (eight) hours as needed for nausea or vomiting. (Patient not taking: Reported on 12/16/2018) 20 tablet 0  . Ascorbic  Acid (VITAMIN C PO) Take 1 tablet by mouth daily.    [name] Kitchen CALCIUM PO Take 1 tablet by mouth 2 (two) times a day.    . ferrous sulfate 325 (65 FE) MG tablet Take 325 mg by mouth 2 (two) times a day.      No facility-administered medications prior to visit.      Allergies:   Patient has no known allergies.   Social History   Socioeconomic History  . Marital status: Single    Spouse name: Not on file  . Number of children: 1  . Years of education: 15  . Highest education level: Not on file  Occupational History  . Occupation: pilot-retired  Social Needs  . Financial resource strain: Not on file  . Food insecurity    Worry: Not on file    Inability: Not on file  . Transportation needs    Medical: Not on file    Non-medical: Not on file  Tobacco Use  . Smoking status: Never Smoker  . Smokeless tobacco: Never Used  Substance and Sexual Activity  . Alcohol use: Yes    Comment: rarely  . Drug use: Never  . Sexual activity: Yes  Lifestyle  . Physical activity    Days per week: Not on file    Minutes per session: Not on file  . Stress: Not on file  Relationships  . Social Herbalist on phone: Not on file    Gets together: Not on file    Attends religious service: Not on file    Active member of club or organization: Not on file    Attends meetings of clubs or organizations: Not on file    Relationship status: Not on file  Other Topics Concern  . Not on file  Social History Narrative   [place] [facility], fishes.    Right-handed.   1 cup caffeine per day.     Family History:  The patient's family history includes Cancer in his mother; Other in his father.   ROS:   Please see the history of present illness.    ROS All other systems are  reviewed and are negative.   PHYSICAL EXAM:   VS:  BP (!) 146/76   Pulse 70   Ht 5' 9.5" (1.765 m)   Wt 225 lb (102.1 kg)   SpO2 100%   BMI 32.75 kg/m      General: Alert, oriented x3, no distress, obese, healthy pacemaker site Head: no evidence of trauma, PERRL, EOMI, no exophtalmos or lid lag, no myxedema, no xanthelasma; normal ears, nose and oropharynx Neck: normal jugular venous pulsations and no hepatojugular reflux; brisk carotid pulses without delay and no carotid bruits Chest: clear to auscultation, no signs of consolidation by percussion or palpation, normal fremitus, symmetrical and full respiratory excursions Cardiovascular: normal position and quality of the apical impulse, regular rhythm, normal first and second heart sounds, no murmurs, rubs or gallops Abdomen: no tenderness or distention, no masses by palpation, no abnormal pulsatility or arterial bruits, normal bowel sounds, no hepatosplenomegaly Extremities: no clubbing, cyanosis or edema; 2+ radial, ulnar and brachial pulses bilaterally; 2+ right femoral, posterior tibial and dorsalis pedis pulses; 2+ left femoral, posterior tibial and dorsalis pedis pulses; no subclavian or femoral bruits Neurological: grossly nonfocal (postpolio sd inferior extremities asymmetry) Psych: Normal mood and affect    Wt Readings from Last 3 Encounters:  12/16/18 225 lb (102.1 kg)  10/01/18 216  lb 6.4 oz (98.2 kg)  08/26/18 221 lb 6.4 oz (100.4 kg)     Studies/Labs Reviewed:   EKG:  EKG is ordered today.  It shows background atrial fibrillation with 100% V paced rhythm  Recent Labs: 12/16/2018: BUN 32; Creatinine, Ser 1.36; Hemoglobin 11.0; Platelets 152; Potassium 5.8; Sodium 135   Lipid Panel     Component Value Date/Time   CHOL 134 12/20/2010 0903   TRIG 104 12/20/2010 0903   HDL 38 (L) 12/20/2010 0903   CHOLHDL 3.5 12/20/2010 0903   VLDL 21 12/20/2010 0903   LDLCALC 75 12/20/2010 0903    10/30/2017 Creat 1.34, GFR  51. CO2 19, K 5.1. 10/16/2017 Hgb A1c 6.5% Lipid Panel  Dr. Melford Aase, December 2018 Total cholesterol 162, triglycerides 1 7, HDL, LDL 95 Creatinine 1.45, hemoglobin 11.3  ASSESSMENT:    1. Coronary artery disease of bypass graft of native heart with stable angina pectoris ([place])   2. PAF (paroxysmal atrial fibrillation) ([place])   3. Shortness of breath   4. SSS (sick sinus syndrome) ([place])   5. NSVT (nonsustained ventricular tachycardia) (HCC)   6. Pacemaker   7. Essential hypertension   8. Diabetes mellitus type 2 in obese ([place])   9. Dyslipidemia   10. Mild obesity      PLAN:  In order of problems listed above:  1. CAD s/p redo CABG with exertional angina: It seems he is describing exertional angina CCS class 2-3 and exertional dyspnea, while receiving  2 antianginals (high-dose carvedilol and a maximum dose of amlodipine). The recurrence of symptoms seems to be coincident with the recurrence of atrial fibrillation, but similar symptoms were present several months ago even without AFib.  He does not believe that nuclear tests are accurate. We will refer for a coronary angiography, possible PCI. This procedure has been fully reviewed with the patient and informed consent has been obtained. If he needs PCI, will have to decide the risk-benefit of simultaneous dual antiplatelet and oral anticoagulant therapy. If no PCI, need to start anticoagulation in order to appropriately treat the AFib. Will also schedule for echo, preferably on day of the cath.  2.  AFib: Recurrent persistent atrial fibrillation, seems to be symptomatic despite good rate control. CHADSVasc score 4 (age, vascular disease, HTN, DM). Not currently on anticoagulation. Has history of GI bleeding related anemia in the past. Reviewed the risk of stroke and the fact that he cannot undergo CV without anticoagulation. If cath does not show cause of symptoms, recommend anticoagulation (and possible referral for Watchman device) and  antiarrhythmics.   3. Sinus node arrest/CHB: Reportedly his device was implanted for complete heart block, he is but he really has a sinus node arrest and he is "atrially dependent" when not in AFib. The prevalence of V pacing has increased during AFib.  4. NSVT: Episodes are infrequent, a few times a year, consistently asymptomatic. None recently.  5. PPM: Comprehensive check today (unable to test atrial threshold). Continue with remote downloads every 3 months and office visit in 6 months.  Normal device function.  6. HTN:  Target systolic blood pressure less than 130, a little high today. Will have to hold diuretics for cath. Reassess after cath +/- cardioversion.  7. DM: reports good control.  8. HLP: Intolerant to ever single available statin. Will discuss PCSK9 again, but if he cannot pay for Eliquis he will not take those meds either.  9. Obesity: Dweight up and down.    Medication Adjustments/Labs and Tests  Ordered: Current medicines are reviewed at length with the patient today.  Concerns regarding medicines are outlined above.  Medication changes, Labs and Tests ordered today are listed in the Patient Instructions below. Patient Instructions  Medication Instructions:  No changes *If you need a refill on your cardiac medications before your next appointment, please call your pharmacy*  Lab Work: Your provider would like for you to have the following labs today: CBC and CMET  If you have labs (blood work) drawn today and your tests are completely normal, you will receive your results only by: [name] Kitchen MyChart Message (if you have MyChart) OR . A paper copy in the mail If you have any lab test that is abnormal or we need to change your treatment, we will call you to review the results.  Testing/Procedures: Your physician has requested that you have a cardiac catheterization. Cardiac catheterization is used to diagnose and/or treat various heart conditions. Doctors may recommend this  procedure for a number of different reasons. The most common reason is to evaluate chest pain. Chest pain can be a symptom of coronary artery disease (CAD), and cardiac catheterization can show whether plaque is narrowing or blocking your heart's arteries. This procedure is also used to evaluate the valves, as well as measure the blood flow and oxygen levels in different parts of your heart. For further information please visit [email or web address][employer][email or web address]. Please follow instruction sheet, as given.  Your physician has requested that you have an echocardiogram. Echocardiography is a painless test that uses sound waves to create images of your heart. It provides your doctor with information about the size and shape of your heart and how well your heart's chambers and valves are working. You may receive an ultrasound enhancing agent through an IV if needed to better visualize your heart during the echo.This procedure takes approximately one hour. There are no restrictions for this procedure. This will take place at the [address].    Follow-Up: At [facility], you and your health needs are our priority.  As part of our continuing mission to provide you with exceptional heart care, we have created designated Provider Care Teams.  These Care Teams include your primary Cardiologist (physician) and Advanced Practice Providers (APPs -  Physician Assistants and Nurse Practitioners) who all work together to provide you with the care you need, when you need it.  Your next appointment:   In 2-3 weeks with Dr. Sallyanne Kuster (in office)  Other Instructions    [facility] [address] Dept: [phone] Loc: [place]  12/16/2018  You are scheduled for a Cardiac Catheterization on Wednesday, November 11 with Dr. Kathlyn [place].  1. Please arrive at the [facility] (Main  Entrance A) at [facility]: [address] [place], [place] [address] at 10:30 AM (This time is two hours before your procedure to ensure your preparation). Free valet parking service is available.   Special note: Every effort is made to have your procedure done on time. Please understand that emergencies sometimes delay scheduled procedures.  2. Diet: Do not eat solid foods after midnight.  The patient may have clear liquids until 5am upon the day of the procedure.  3. Labs:  You will need to have the coronavirus test completed prior to your procedure. An appointment has been made at 10:20 on 12/16/2018. This is a Drive Up Visit at the [employer]  [facility] [address] Someone will direct you to the appropriate testing line. Please tell them that you are there for procedure testing. Stay in your car and someone will be with you shortly. Please make sure to have all other labs completed before this test because you will need to stay quarantined until your procedure.  4. Medication instructions in preparation for your procedure: Hold the Hydrochlorothiazide the morning of the procedure Hold the Metformin the morning of the procedure and then 48 hours after. Hold all diabetic medication the morning of the procedure (Patient not taking Eliquis)  On the morning of your procedure, take your Aspirin and any morning medicines NOT listed above.  You may use sips of water.  5. Plan for one night stay--bring personal belongings. 6. Bring a current list of your medications and current insurance cards. 7. You MUST have a responsible person to drive you home. 8. Someone MUST be with you the first 24 hours after you arrive home or your discharge will be delayed. 9. Please wear clothes that are easy to get on and off and wear slip-on shoes.  Thank you for allowing [place] to care for you!   -- [facility] Invasive Cardiovascular services      Signed, Sanda Klein, MD  12/16/2018 9:10 PM     [address], [place], [place]  [address] Phone: [phone]; Fax: [phone]

## 2018-12-16 NOTE — Progress Notes (Signed)
Cardiology Office Note    Date:  12/16/2018   ID:  Alan [name], DOB [birth year], MRN [id number]  PCP:  [name], MD  Cardiologist:  Sanda Klein, MD   Chief Complaint  Patient presents with  . Atrial Fibrillation  . Coronary Artery Disease  . Pacemaker Check    History of Present Illness:  Alan [name] is a 78 y.o. male with coronary artery disease, previous CABG, sinus node arrest, status post dual-chamber permanent pacemaker ("atrially dependent"), type 2 diabetes mellitus and dyslipidemia, obstructive sleep apnea on CPAP, here for pacemaker check.  He had uncomplicated shoulder surgery since his last appointment. He has stopped taking Eliquis due to the cost (not due to side effects). He is not taking anticoagulation. He took warfarin in the past, stopped after GI bleeding event.  He has been feeling poorly  (dyspnea with walking in the house, less prominent exertional chest tightness) for the last 2 months. This seems to correlate with recurrent atrial fibrillation, starting September 9 and persistent since then. Ventricular rate is well controlled.  We had discussed a cardiac cath for similar symptoms last year, before his lumbar myelogram. He seemed to improve spontaneously.  Denies edema, syncope, palpitations, falls, bleeding events.  Reports good glycemic control.  His pacemaker is functioning normally on a comprehensive check today.  The device was implanted in 2014 (Medtronic Adapta dual-chamber) and still has roughly 8 years of battery longevity.  He is "atrially dependent" without any detectable atrial activity and with 98% atrial pacing before onset of AFib (now w 47% AFib in last 6 months).  Ventricular pacing has increased during the AFib, from 2% to 21%.    He has a longstanding history of cardiac problems. He had CABG in 1990 (sequential LIMA to LAD and diagonal), multiple RCA PCI 1990-2004 and a redo CABG in 2004 (SVG-RCA-PDA, with surgical MAZE  procedure, Dr. Amador Cunas). Cath 2007 and 2012 show occluded LAD and RCA with patent grafts, 60% ostial OM and diffuse stenoses distal LCX. 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 He had a generator changeout and ventricular lead revision in January 2014. He does not tolerate VVI pacing. He has had occasional nonsustained VT recorded by the device, always asymptomatic. He has had paroxysmal atrial fibrillation in the remote past. No history of stroke or TIA. Warfarin was stopped for recurrent GI bleeding requiring transfusions. Despite stopping warfarin anticoagulation, he had another episode of GI bleeding requiring transfusion in April 2015. He was seen at [facility] and underwent repeat endoscopy (endoscopic workup in February was negative). He tells me they found "2 small holes in his stomach" which were cauterized. He has not had any bleeding events since. He remains off anti-coagulation.  He wears CPAP for OSA, has insulin requiring type II DM, mild dyslipidemia intolerant to all statins, post-polio syndrome, lumbar spine surgery 01/2012.  He does not trust nuclear stress tests - reports they were repeatedly wrong in the past. [name] told him he should always have a cath rather than a stress test.    Past Medical History:  Diagnosis Date  . Anticoagulant long-term use    eliquis  . Arthritis   . Arthropathy of left shoulder   . Biceps tendonitis, left   . Bursitis of left shoulder   . Cardiac pacemaker  in situ    first insertion 2008;  genertor and new lead change 03-08-2012;   Medtronic  . CHB (complete heart block) ([place])    s/p  PPM 2008  . Chronic back pain   . CKD (chronic kidney disease), stage III   . Coronary artery disease cardiologist--- dr Davonna Ertl   hx  CABG x2  1990 and re-do 2004;  multiple PCI to [place]  to 2004;    cardiac cath 2012  occluded LAD and RCA with patent grafts  . History of coronary angioplasty    multiple PCI to RCA ,  1990 to 2004  . History of GI bleed followed by GI-- Olevia Perches PA @ [facility] in [place]   01/ 2020  upper and lower GI bleed ;  s/p  EGD with cautery of jejunal bleed and blood transfusion's  . History of kidney stones   . Hx of echocardiogram 06/29/2008   EF 45-50%   . Hypertension   . Ischemic cardiomyopathy    previously reported , ef 40-45%;  2010--- ef 45-50%;   last echo 2017 55-60%  . Mixed hyperlipidemia   . Nocturia   . OSA on CPAP    cpap, 12, last sleep study Jan2013, sees Dr. Keturah Barre  . PAF (paroxysmal atrial fibrillation) (HCC)    long hx PAF--- followed by dr Ricci Dirocco  . Post-polio syndrome    dx polio age 89;   09-30-2018  per pt occasional gait issues and occasion fall  . Rotator cuff tear, left   . S/P CABG x 2    1990 x2  and 2004  x2  . Sinus node dysfunction ([place])    2015--- sinus node arrest  with intact AV conduction  . Type 2 diabetes mellitus ([place])    followed by pcp    Past Surgical History:  Procedure Laterality Date  . APPENDECTOMY  child  . BLEPHAROPLASTY Bilateral 12/2014   upper eyelid  . CARDIAC PACEMAKER PLACEMENT  2008  . CARPAL TUNNEL RELEASE Bilateral 2015;  2016  . CATARACT EXTRACTION W/ INTRAOCULAR LENS  IMPLANT, BILATERAL  2017  . CORONARY ANGIOPLASTY  1990  to 2004   multiple to RCA  . CORONARY ARTERY BYPASS GRAFT  1990    @ [place] (dr chitwood)   seq. LIMA to LAD and Diagonal  . CORONARY ARTERY BYPASS GRAFT  2004     dr Amador Cunas   SVG to  RCA and PDA;  ALSO MAZE  PROCEDURE  . ESOPHAGOGASTRODUODENOSCOPY Left 03/18/2013   Procedure: ESOPHAGOGASTRODUODENOSCOPY (EGD);  Surgeon: Cleotis Nipper, MD;  [facility] ENDOSCOPY;  Service: Endoscopy;  Laterality: Left;  . HARDWARE REMOVAL N/A 05/18/2016   Procedure: Removal of broken hardware Lumbar three-four;  Surgeon: [name],  MD;  Location: [place];  Service: Neurosurgery;  Laterality: N/A;  . HERNIA REPAIR    . LEFT HEART CATHETERIZATION WITH CORONARY/GRAFT ANGIOGRAM N/A 12/21/2010   Procedure: LEFT HEART CATHETERIZATION WITH Beatrix Fetters;  Surgeon: Leonie Man, MD;  Location: [facility] CATH LAB;  Service: Cardiovascular;  Laterality: N/A;  . LEG SURGERY Right 2002  approx.   lengthened his leg  . LUMBAR LAMINECTOMY/DECOMPRESSION MICRODISCECTOMY  01/10/2012   Procedure: LUMBAR LAMINECTOMY/DECOMPRESSION MICRODISCECTOMY 1 LEVEL;  Surgeon: [name], MD;  Location: [place] NEURO ORS;  Service: Neurosurgery;  Laterality: Bilateral;  Lumbar three-four decompression, Posterior lateral fusion lumbar three-four, posterior spinus plate lumbar three-four  . LUMBAR LAMINECTOMY/DECOMPRESSION MICRODISCECTOMY Left 05/18/2016   Procedure: Laminectomy and  Foraminotomy - Lumbar two-lumbar three- Lumbar four-lumbar five- left with removal hardware lumbar three-four;  Surgeon: [name], MD;  Location: [place];  Service: Neurosurgery;  Laterality: Left;  . NASAL SEPTUM SURGERY  yrs ago  . PACEMAKER REVISION N/A 03/07/2012   Procedure: PACEMAKER REVISION;  Surgeon: Sanda Klein, MD;  Location: [place] CATH LAB;  Service: Cardiovascular;  Laterality: N/A;  . POSTERIOR LUMBAR FUSION  02-22-2017   dr Ronnald Ramp  @ [facility]   re-do laminectomy L2-3 and fusion  . POSTERIOR LUMBAR FUSION  03-11-2018   dr Ronnald Ramp @ [facility]   laminectomy L4-5;  fixation L2-S1 and fusion L3-S1  . SHOULDER ARTHROSCOPY Right after 2016, pt unsure year  . SHOULDER ARTHROSCOPY WITH OPEN ROTATOR CUFF REPAIR Right 03/30/2014   Procedure: RIGHT SHOULDER ARTHROSCOPY  OPEN ROTATOR CUFF REPAIR;  Surgeon: Kerin Salen, MD;  Location: [place];  Service: Orthopedics;  Laterality: Right;  . SHOULDER ARTHROSCOPY WITH SUBACROMIAL DECOMPRESSION, ROTATOR CUFF REPAIR AND BICEP TENDON REPAIR Left 10/01/2018   Procedure: LEFT SHOULDER ARTHROSCOPY, DISTAL CLAVICLE EXCISION,SUBACROMIAL, DECOMPRESSION,  RORATOR CUFF REPAIR, BICEPS TENODESIS;  Surgeon: Renette Butters, MD;  Location: [place];  Service: Orthopedics;  Laterality: Left;  . SHOULDER OPEN ROTATOR CUFF REPAIR Right 03/30/2014   Procedure: ROTATOR CUFF REPAIR SHOULDER OPEN;  Surgeon: Kerin Salen, MD;  Location: [place];  Service: Orthopedics;  Laterality: Right;  . TONSILLECTOMY  child  . TOTAL KNEE ARTHROPLASTY Left 2006    Current Medications: Outpatient Medications Prior to Visit  Medication Sig Dispense Refill  . amLODipine (NORVASC) 10 MG tablet Take 10 mg by mouth daily.     [name] Kitchen apixaban (ELIQUIS) 5 MG TABS tablet Take 1 tablet (5 mg total) by mouth 2 (two) times daily. (Patient not taking: Reported on 12/16/2018) 60 tablet 3  . baclofen (LIORESAL) 10 MG tablet Take 1 tablet (10 mg total) by mouth 3 (three) times daily as needed for muscle spasms. (Patient not taking: Reported on 12/16/2018) 40 each 0  . carboxymethylcellulose (REFRESH PLUS) 0.5 % SOLN Place 1 drop into both eyes every morning.    . carvedilol (COREG) 25 MG tablet Take 2 tablets by mouth twice daily (Patient taking differently: Take 50 mg by mouth 2 (two) times daily with a meal. ) 360 tablet 3  . diclofenac sodium (VOLTAREN) 1 % GEL Apply 2 g topically daily as needed (pain).     . fluticasone (FLONASE) 50 MCG/ACT nasal spray Place 2 sprays into both nostrils daily as needed for allergies.     [name] Kitchen glipiZIDE (GLUCOTROL) 10 MG tablet Take 10 mg by mouth 2 (two) times daily.     . hydrochlorothiazide (HYDRODIURIL) 25 MG tablet Take 25 mg by mouth daily.     . Lancets (FREESTYLE) lancets Use to check blood sugar 4 time(s) daily.    [name] Kitchen losartan (COZAAR) 100 MG tablet Take 100 mg by mouth daily.     . metFORMIN (GLUCOPHAGE) 500 MG tablet Take 500 mg by mouth 2 (two) times a day.     . metFORMIN (GLUCOPHAGE-XR) 500 MG 24 hr tablet Take 500 mg by mouth 2 (two) times a day.     . Multiple Vitamin (MULTIVITAMIN WITH MINERALS) TABS tablet Take 1 tablet by  mouth daily.    . ondansetron (ZOFRAN) 4 MG tablet Take 1 tablet (4 mg total) by mouth every 8 (eight) hours as needed for nausea or vomiting. (Patient not taking: Reported on 12/16/2018) 20 tablet 0  . Ascorbic  Acid (VITAMIN C PO) Take 1 tablet by mouth daily.    [name] Kitchen CALCIUM PO Take 1 tablet by mouth 2 (two) times a day.    . ferrous sulfate 325 (65 FE) MG tablet Take 325 mg by mouth 2 (two) times a day.      No facility-administered medications prior to visit.      Allergies:   Patient has no known allergies.   Social History   Socioeconomic History  . Marital status: Single    Spouse name: Not on file  . Number of children: 1  . Years of education: 74  . Highest education level: Not on file  Occupational History  . Occupation: pilot-retired  Social Needs  . Financial resource strain: Not on file  . Food insecurity    Worry: Not on file    Inability: Not on file  . Transportation needs    Medical: Not on file    Non-medical: Not on file  Tobacco Use  . Smoking status: Never Smoker  . Smokeless tobacco: Never Used  Substance and Sexual Activity  . Alcohol use: Yes    Comment: rarely  . Drug use: Never  . Sexual activity: Yes  Lifestyle  . Physical activity    Days per week: Not on file    Minutes per session: Not on file  . Stress: Not on file  Relationships  . Social Herbalist on phone: Not on file    Gets together: Not on file    Attends religious service: Not on file    Active member of club or organization: Not on file    Attends meetings of clubs or organizations: Not on file    Relationship status: Not on file  Other Topics Concern  . Not on file  Social History Narrative   [place] [address] [place] [facility], fishes.    Right-handed.   1 cup caffeine per day.     Family History:  The patient's family history includes Cancer in his mother; Other in his father.   ROS:   Please see the history of present illness.    ROS All other systems are  reviewed and are negative.   PHYSICAL EXAM:   VS:  BP (!) 146/76   Pulse 70   Ht 5' 9.5" (1.765 m)   Wt 225 lb (102.1 kg)   SpO2 100%   BMI 32.75 kg/m      General: Alert, oriented x3, no distress, obese, healthy pacemaker site Head: no evidence of trauma, PERRL, EOMI, no exophtalmos or lid lag, no myxedema, no xanthelasma; normal ears, nose and oropharynx Neck: normal jugular venous pulsations and no hepatojugular reflux; brisk carotid pulses without delay and no carotid bruits Chest: clear to auscultation, no signs of consolidation by percussion or palpation, normal fremitus, symmetrical and full respiratory excursions Cardiovascular: normal position and quality of the apical impulse, regular rhythm, normal first and second heart sounds, no murmurs, rubs or gallops Abdomen: no tenderness or distention, no masses by palpation, no abnormal pulsatility or arterial bruits, normal bowel sounds, no hepatosplenomegaly Extremities: no clubbing, cyanosis or edema; 2+ radial, ulnar and brachial pulses bilaterally; 2+ right femoral, posterior tibial and dorsalis pedis pulses; 2+ left femoral, posterior tibial and dorsalis pedis pulses; no subclavian or femoral bruits Neurological: grossly nonfocal (postpolio sd inferior extremities asymmetry) Psych: Normal mood and affect    Wt Readings from Last 3 Encounters:  12/16/18 225 lb (102.1 kg)  10/01/18 216  lb 6.4 oz (98.2 kg)  08/26/18 221 lb 6.4 oz (100.4 kg)     Studies/Labs Reviewed:   EKG:  EKG is ordered today.  It shows background atrial fibrillation with 100% V paced rhythm  Recent Labs: 12/16/2018: BUN 32; Creatinine, Ser 1.36; Hemoglobin 11.0; Platelets 152; Potassium 5.8; Sodium 135   Lipid Panel     Component Value Date/Time   CHOL 134 12/20/2010 0903   TRIG 104 12/20/2010 0903   HDL 38 (L) 12/20/2010 0903   CHOLHDL 3.5 12/20/2010 0903   VLDL 21 12/20/2010 0903   LDLCALC 75 12/20/2010 0903    10/30/2017 Creat 1.34, GFR  51. CO2 19, K 5.1. 10/16/2017 Hgb A1c 6.5% Lipid Panel  Dr. Melford Aase, December 2018 Total cholesterol 162, triglycerides 1 7, HDL, LDL 95 Creatinine 1.45, hemoglobin 11.3  ASSESSMENT:    1. Coronary artery disease of bypass graft of native heart with stable angina pectoris ([place])   2. PAF (paroxysmal atrial fibrillation) ([place])   3. Shortness of breath   4. SSS (sick sinus syndrome) ([place])   5. NSVT (nonsustained ventricular tachycardia) (HCC)   6. Pacemaker   7. Essential hypertension   8. Diabetes mellitus type 2 in obese ([place])   9. Dyslipidemia   10. Mild obesity      PLAN:  In order of problems listed above:  1. CAD s/p redo CABG with exertional angina: It seems he is describing exertional angina CCS class 2-3 and exertional dyspnea, while receiving  2 antianginals (high-dose carvedilol and a maximum dose of amlodipine). The recurrence of symptoms seems to be coincident with the recurrence of atrial fibrillation, but similar symptoms were present several months ago even without AFib.  He does not believe that nuclear tests are accurate. We will refer for a coronary angiography, possible PCI. This procedure has been fully reviewed with the patient and informed consent has been obtained. If he needs PCI, will have to decide the risk-benefit of simultaneous dual antiplatelet and oral anticoagulant therapy. If no PCI, need to start anticoagulation in order to appropriately treat the AFib. Will also schedule for echo, preferably on day of the cath.  2.  AFib: Recurrent persistent atrial fibrillation, seems to be symptomatic despite good rate control. CHADSVasc score 4 (age, vascular disease, HTN, DM). Not currently on anticoagulation. Has history of GI bleeding related anemia in the past. Reviewed the risk of stroke and the fact that he cannot undergo CV without anticoagulation. If cath does not show cause of symptoms, recommend anticoagulation (and possible referral for Watchman device) and  antiarrhythmics.   3. Sinus node arrest/CHB: Reportedly his device was implanted for complete heart block, he is but he really has a sinus node arrest and he is "atrially dependent" when not in AFib. The prevalence of V pacing has increased during AFib.  4. NSVT: Episodes are infrequent, a few times a year, consistently asymptomatic. None recently.  5. PPM: Comprehensive check today (unable to test atrial threshold). Continue with remote downloads every 3 months and office visit in 6 months.  Normal device function.  6. HTN:  Target systolic blood pressure less than 130, a little high today. Will have to hold diuretics for cath. Reassess after cath +/- cardioversion.  7. DM: reports good control.  8. HLP: Intolerant to ever single available statin. Will discuss PCSK9 again, but if he cannot pay for Eliquis he will not take those meds either.  9. Obesity: Dweight up and down.    Medication Adjustments/Labs and Tests  Ordered: Current medicines are reviewed at length with the patient today.  Concerns regarding medicines are outlined above.  Medication changes, Labs and Tests ordered today are listed in the Patient Instructions below. Patient Instructions  Medication Instructions:  No changes *If you need a refill on your cardiac medications before your next appointment, please call your pharmacy*  Lab Work: Your provider would like for you to have the following labs today: CBC and CMET  If you have labs (blood work) drawn today and your tests are completely normal, you will receive your results only by: [name] Kitchen MyChart Message (if you have MyChart) OR . A paper copy in the mail If you have any lab test that is abnormal or we need to change your treatment, we will call you to review the results.  Testing/Procedures: Your physician has requested that you have a cardiac catheterization. Cardiac catheterization is used to diagnose and/or treat various heart conditions. Doctors may recommend this  procedure for a number of different reasons. The most common reason is to evaluate chest pain. Chest pain can be a symptom of coronary artery disease (CAD), and cardiac catheterization can show whether plaque is narrowing or blocking your heart's arteries. This procedure is also used to evaluate the valves, as well as measure the blood flow and oxygen levels in different parts of your heart. For further information please visit HugeFiesta.tn. Please follow instruction sheet, as given.  Your physician has requested that you have an echocardiogram. Echocardiography is a painless test that uses sound waves to create images of your heart. It provides your doctor with information about the size and shape of your heart and how well your heart's chambers and valves are working. You may receive an ultrasound enhancing agent through an IV if needed to better visualize your heart during the echo.This procedure takes approximately one hour. There are no restrictions for this procedure. This will take place at the [address], [address].    Follow-Up: At [facility], you and your health needs are our priority.  As part of our continuing mission to provide you with exceptional heart care, we have created designated Provider Care Teams.  These Care Teams include your primary Cardiologist (physician) and Advanced Practice Providers (APPs -  Physician Assistants and Nurse Practitioners) who all work together to provide you with the care you need, when you need it.  Your next appointment:   In 2-3 weeks with Dr. Sallyanne Kuster (in office)  Other Instructions    [place] [address] Dept: [phone] Loc: [place]  12/16/2018  You are scheduled for a Cardiac Catheterization on Wednesday, November 11 with Dr. Kathlyn [place].  1. Please arrive at the [facility] (Main  Entrance A) at [facility]: [address] [name], [place] [address] at 10:30 AM (This time is two hours before your procedure to ensure your preparation). Free valet parking service is available.   Special note: Every effort is made to have your procedure done on time. Please understand that emergencies sometimes delay scheduled procedures.  2. Diet: Do not eat solid foods after midnight.  The patient may have clear liquids until 5am upon the day of the procedure.  3. Labs:  You will need to have the coronavirus test completed prior to your procedure. An appointment has been made at 10:20 on 12/16/2018. This is a Drive Up Visit at the [employer] [facility] [address]. Someone will direct you to the appropriate testing line. Please tell them that you are there for procedure testing. Stay in your car and someone will be with you shortly. Please make sure to have all other labs completed before this test because you will need to stay quarantined until your procedure.  4. Medication instructions in preparation for your procedure: Hold the Hydrochlorothiazide the morning of the procedure Hold the Metformin the morning of the procedure and then 48 hours after. Hold all diabetic medication the morning of the procedure (Patient not taking Eliquis)  On the morning of your procedure, take your Aspirin and any morning medicines NOT listed above.  You may use sips of water.  5. Plan for one night stay--bring personal belongings. 6. Bring a current list of your medications and current insurance cards. 7. You MUST have a responsible person to drive you home. 8. Someone MUST be with you the first 24 hours after you arrive home or your discharge will be delayed. 9. Please wear clothes that are easy to get on and off and wear slip-on shoes.  Thank you for allowing [place] to care for you!   -- [facility] Invasive Cardiovascular services      Signed, Sanda Klein, MD  12/16/2018 9:10 PM     [address], [place], [place]  [address] Phone: [phone]; Fax: [phone]

## 2018-12-16 NOTE — Telephone Encounter (Signed)
    Received outpatient page regarding lab work that was drawn today, 12/16/2018. K+ came back elevated at 5.8. Attempted call to patient however there was no answer. LVM. Will place order for repeat lab to confirm. Does not appear that that he is on Kdur supplementation. Stressed the importance of avoiding K+ rich foods.   Will send to triage to call patient in the morning and will forward to Dr. Sallyanne Kuster as well.    Kathyrn Drown NP-C [place] Pager: [phone]

## 2018-12-17 ENCOUNTER — Telehealth: Payer: Self-pay | Admitting: Internal Medicine

## 2018-12-17 ENCOUNTER — Telehealth: Payer: Self-pay | Admitting: *Deleted

## 2018-12-17 ENCOUNTER — Other Ambulatory Visit: Payer: Self-pay | Admitting: *Deleted

## 2018-12-17 ENCOUNTER — Telehealth: Payer: Self-pay | Admitting: Cardiovascular Disease

## 2018-12-17 NOTE — Telephone Encounter (Signed)
Patient made aware of results and verbalized understanding.  Pre-cath labs.  Hemoglobin is stable.  The kidney function is mildly abnormal, but best it has been in a year.  K 5.8 is most likely due to hemolysis, not likely real. We need to recheck the BMET STAT on arrival for the procedure.   He will get a STAT BMET tomorrow morning before the cardiac cath. Short Stay has been called and made aware. Orders are placed.

## 2018-12-17 NOTE — Telephone Encounter (Deleted)
    COVID-19 Pre-Screening Questions:  . In the past 7 to 10 days have you had a cough,  shortness of breath, headache, congestion, fever (100 or greater) body aches, chills, sore throat, or sudden loss of taste or sense of smell? no . Have you been around anyone with known Covid 19? [name] Kitchen Have you been around anyone who is awaiting Covid 19 test results in the past 7 to 10 days? . Have you been around anyone who has been exposed to Covid 19, or has mentioned symptoms of Covid 19 within the past 7 to 10 days?  If you have any concerns/questions about symptoms patients report during screening (either on the phone or at threshold). Contact the provider seeing the patient or DOD for further guidance.  If neither are available contact a member of the leadership team.

## 2018-12-17 NOTE — Telephone Encounter (Addendum)
Pt contacted pre-catheterization scheduled at [facility] for: Wednesday December 18, 2018 12:30 PM Verified arrival time and place: [facility]) at: 10:00 AM- needs BMP STAT   No solid food after midnight prior to cath, clear liquids until 5 AM day of procedure. Contrast allergy: no  Hold: Metformin-day of procedure and 48 hours post procedure. Glipizide-AM of procedure. Losartan-day of procedure-GFR 50 HCTZ-day of procedure-GFR 50 Eliquis-pt reported not taking 12/16/18  Except hold medications AM meds can be  taken pre-cath with sip of water including: ASA 81 mg   Confirmed patient has responsible adult to drive home post procedure and observe 24 hours after arriving home: yes  Currently, due to Covid-19 pandemic, only one support person will be allowed with patient. Must be the same support person for that patient's entire stay, will be screened and required to wear a mask. They will be asked to wait in the waiting room for the duration of the patient's stay.  Patients are required to wear a mask when they enter the hospital.      COVID-19 Pre-Screening Questions:  . In the past 7 to 10 days have you had a cough,  shortness of breath, headache, congestion, fever (100 or greater) body aches, chills, sore throat, or sudden loss of taste or sense of smell? no . Have you been around anyone with known Covid 19? no . Have you been around anyone who is awaiting Covid 19 test results in the past 7 to 10 days? no . Have you been around anyone who has been exposed to Covid 19, or has mentioned symptoms of Covid 19 within the past 7 to 10 days?  no  I reviewed procedure/mask/visitor instructions, Covid-19 screening questions with patient, he verbalized understanding, thanked me for call.

## 2018-12-17 NOTE — Telephone Encounter (Signed)
New Message     Pt is returning a call for labs    Please call back

## 2018-12-17 NOTE — Telephone Encounter (Signed)
Patient notified that he needs repeat lab.  Informed patient that he can walk in anytime at [facility] location - lab order should already be there for him.  Lab does close for lunch (12:30-1:30).

## 2018-12-17 NOTE — Telephone Encounter (Signed)
Cardiology Moonlighter Note  Contacted by [facility] for critical lab. K 5.8. Will forward this note to ordering provider Dr Sallyanne Kuster to determine next steps.   Marcie Mowers, MD Cardiology Fellow, PGY-7

## 2018-12-18 ENCOUNTER — Other Ambulatory Visit: Payer: Self-pay

## 2018-12-18 ENCOUNTER — Ambulatory Visit (HOSPITAL_COMMUNITY)
Admission: RE | Admit: 2018-12-18 | Discharge: 2018-12-18 | Disposition: A | Discharge status: Home / Self Care | Payer: Medicare HMO | Attending: Cardiovascular Disease | Admitting: Cardiovascular Disease

## 2018-12-18 ENCOUNTER — Encounter (HOSPITAL_COMMUNITY)
Admission: RE | Disposition: A | Discharge status: Home / Self Care | Payer: Self-pay | Source: Home / Self Care | Attending: Cardiovascular Disease

## 2018-12-18 DIAGNOSIS — I495 Sick sinus syndrome: Secondary | ICD-10-CM | POA: Diagnosis not present

## 2018-12-18 DIAGNOSIS — Z7901 Long term (current) use of anticoagulants: Secondary | ICD-10-CM | POA: Insufficient documentation

## 2018-12-18 DIAGNOSIS — E669 Obesity, unspecified: Secondary | ICD-10-CM | POA: Diagnosis not present

## 2018-12-18 DIAGNOSIS — N183 Chronic kidney disease, stage 3 unspecified: Secondary | ICD-10-CM | POA: Diagnosis not present

## 2018-12-18 DIAGNOSIS — E1122 Type 2 diabetes mellitus with diabetic chronic kidney disease: Secondary | ICD-10-CM | POA: Diagnosis not present

## 2018-12-18 DIAGNOSIS — Z79899 Other long term (current) drug therapy: Secondary | ICD-10-CM | POA: Diagnosis not present

## 2018-12-18 DIAGNOSIS — Z6831 Body mass index (BMI) 31.0-31.9, adult: Secondary | ICD-10-CM | POA: Insufficient documentation

## 2018-12-18 DIAGNOSIS — G4733 Obstructive sleep apnea (adult) (pediatric): Secondary | ICD-10-CM | POA: Diagnosis not present

## 2018-12-18 DIAGNOSIS — Z951 Presence of aortocoronary bypass graft: Secondary | ICD-10-CM | POA: Insufficient documentation

## 2018-12-18 DIAGNOSIS — I129 Hypertensive chronic kidney disease with stage 1 through stage 4 chronic kidney disease, or unspecified chronic kidney disease: Secondary | ICD-10-CM | POA: Insufficient documentation

## 2018-12-18 DIAGNOSIS — I442 Atrioventricular block, complete: Secondary | ICD-10-CM | POA: Insufficient documentation

## 2018-12-18 DIAGNOSIS — R079 Chest pain, unspecified: Secondary | ICD-10-CM

## 2018-12-18 DIAGNOSIS — I255 Ischemic cardiomyopathy: Secondary | ICD-10-CM | POA: Insufficient documentation

## 2018-12-18 DIAGNOSIS — Z950 Presence of cardiac pacemaker: Secondary | ICD-10-CM | POA: Insufficient documentation

## 2018-12-18 DIAGNOSIS — I25708 Atherosclerosis of coronary artery bypass graft(s), unspecified, with other forms of angina pectoris: Secondary | ICD-10-CM | POA: Diagnosis present

## 2018-12-18 DIAGNOSIS — R0602 Shortness of breath: Secondary | ICD-10-CM | POA: Diagnosis not present

## 2018-12-18 DIAGNOSIS — E782 Mixed hyperlipidemia: Secondary | ICD-10-CM | POA: Diagnosis not present

## 2018-12-18 DIAGNOSIS — I472 Ventricular tachycardia: Secondary | ICD-10-CM | POA: Insufficient documentation

## 2018-12-18 DIAGNOSIS — I209 Angina pectoris, unspecified: Secondary | ICD-10-CM

## 2018-12-18 DIAGNOSIS — I4819 Other persistent atrial fibrillation: Secondary | ICD-10-CM | POA: Diagnosis not present

## 2018-12-18 DIAGNOSIS — I25709 Atherosclerosis of coronary artery bypass graft(s), unspecified, with unspecified angina pectoris: Secondary | ICD-10-CM

## 2018-12-18 DIAGNOSIS — Z794 Long term (current) use of insulin: Secondary | ICD-10-CM | POA: Insufficient documentation

## 2018-12-18 HISTORY — PX: LEFT HEART CATH AND CORS/GRAFTS ANGIOGRAPHY: CATH118250

## 2018-12-18 LAB — BASIC METABOLIC PANEL
Anion gap: 10 (ref 5–15)
BUN: 31 mg/dL — ABNORMAL HIGH (ref 8–23)
CO2: 17 mmol/L — ABNORMAL LOW (ref 22–32)
Calcium: 9.4 mg/dL (ref 8.9–10.3)
Chloride: 108 mmol/L (ref 98–111)
Creatinine, Ser: 1.21 mg/dL (ref 0.61–1.24)
GFR calc Af Amer: >60 mL/min (ref >60)
GFR calc non Af Amer: 58 mL/min — ABNORMAL LOW (ref >60)
Glucose, Bld: 139 mg/dL — ABNORMAL HIGH (ref 70–99)
Potassium: 4.3 mmol/L (ref 3.5–5.1)
Sodium: 135 mmol/L (ref 135–145)

## 2018-12-18 LAB — GLUCOSE, CAPILLARY
Glucose-Capillary: 161 mg/dL — ABNORMAL HIGH (ref 70–99)
Glucose-Capillary: 134 mg/dL — ABNORMAL HIGH (ref 70–99)

## 2018-12-18 SURGERY — LEFT HEART CATH AND CORS/GRAFTS ANGIOGRAPHY
Anesthesia: LOCAL

## 2018-12-18 MED ORDER — SODIUM CHLORIDE 0.9 % WEIGHT BASED INFUSION: 3.0000 mL/kg/h | INTRAVENOUS | Status: AC

## 2018-12-18 MED ORDER — IOHEXOL 350 MG/ML SOLN
INTRAVENOUS | Status: DC | PRN
  Administered 2018-12-18: 80 mL via INTRA_ARTERIAL

## 2018-12-18 MED ORDER — LABETALOL HCL 5 MG/ML IV SOLN: 10.0000 mg | INTRAVENOUS | Status: DC | PRN

## 2018-12-18 MED ORDER — SODIUM CHLORIDE 0.9% FLUSH: 3.0000 mL | INTRAVENOUS | Status: DC | PRN

## 2018-12-18 MED ORDER — MIDAZOLAM HCL 2 MG/2ML IJ SOLN
INTRAMUSCULAR | Status: DC | PRN
  Administered 2018-12-18 (×3): 1 mg via INTRAVENOUS

## 2018-12-18 MED ORDER — ACETAMINOPHEN 325 MG PO TABS: 650.0000 mg | ORAL_TABLET | ORAL | Status: DC | PRN

## 2018-12-18 MED ORDER — HEPARIN (PORCINE) IN NACL 1000-0.9 UT/500ML-% IV SOLN
INTRAVENOUS | Status: AC
  Filled 2018-12-18: qty 1000

## 2018-12-18 MED ORDER — VERAPAMIL HCL 2.5 MG/ML IV SOLN
INTRAVENOUS | Status: AC
  Filled 2018-12-18: qty 2

## 2018-12-18 MED ORDER — FENTANYL CITRATE (PF) 100 MCG/2ML IJ SOLN
INTRAMUSCULAR | Status: AC
  Filled 2018-12-18: qty 2

## 2018-12-18 MED ORDER — MIDAZOLAM HCL 2 MG/2ML IJ SOLN
INTRAMUSCULAR | Status: AC
  Filled 2018-12-18: qty 2

## 2018-12-18 MED ORDER — LIDOCAINE HCL (PF) 1 % IJ SOLN
INTRAMUSCULAR | Status: AC
  Filled 2018-12-18: qty 30

## 2018-12-18 MED ORDER — ASPIRIN 81 MG PO CHEW: 81.0000 mg | CHEWABLE_TABLET | ORAL | Status: DC

## 2018-12-18 MED ORDER — HYDRALAZINE HCL 20 MG/ML IJ SOLN: 10.0000 mg | INTRAMUSCULAR | Status: DC | PRN

## 2018-12-18 MED ORDER — SODIUM CHLORIDE 0.9% FLUSH: 3.0000 mL | Freq: Two times a day (BID) | INTRAVENOUS | Status: DC

## 2018-12-18 MED ORDER — ONDANSETRON HCL 4 MG/2ML IJ SOLN: 4.0000 mg | Freq: Four times a day (QID) | INTRAMUSCULAR | Status: DC | PRN

## 2018-12-18 MED ORDER — FENTANYL CITRATE (PF) 100 MCG/2ML IJ SOLN
INTRAMUSCULAR | Status: DC | PRN
  Administered 2018-12-18: 50 ug via INTRAVENOUS
  Administered 2018-12-18: 25 ug via INTRAVENOUS

## 2018-12-18 MED ORDER — LIDOCAINE HCL (PF) 1 % IJ SOLN
INTRAMUSCULAR | Status: DC | PRN
  Administered 2018-12-18: 20 mL
  Administered 2018-12-18: 2 mL

## 2018-12-18 MED ORDER — SODIUM CHLORIDE 0.9 % IV SOLN: 250.0000 mL | INTRAVENOUS | Status: DC | PRN

## 2018-12-18 MED ORDER — SODIUM CHLORIDE 0.9 % WEIGHT BASED INFUSION: 1.0000 mL/kg/h | INTRAVENOUS | Status: DC

## 2018-12-18 MED ORDER — HEPARIN (PORCINE) IN NACL 1000-0.9 UT/500ML-% IV SOLN
INTRAVENOUS | Status: DC | PRN
  Administered 2018-12-18 (×2): 500 mL

## 2018-12-18 MED ORDER — SODIUM CHLORIDE 0.9 % IV SOLN: INTRAVENOUS | Status: DC

## 2018-12-18 SURGICAL SUPPLY — 12 items
CATH DXT MULTI JL4 JR4 ANG PIG (CATHETERS) ×2 IMPLANT
CATH INFINITI 5 FR IM (CATHETERS) ×2 IMPLANT
CLOSURE MYNX CONTROL 5F (Vascular Products) ×2 IMPLANT
GLIDESHEATH SLEND SS 6F .021 (SHEATH) ×2 IMPLANT
GUIDEWIRE INQWIRE 1.5J.035X260 (WIRE) ×1 IMPLANT
INQWIRE 1.5J .035X260CM (WIRE) ×2
KIT HEART LEFT (KITS) ×2 IMPLANT
PACK CARDIAC CATHETERIZATION (CUSTOM PROCEDURE TRAY) ×2 IMPLANT
SHEATH PINNACLE 5F 10CM (SHEATH) ×2 IMPLANT
TRANSDUCER W/STOPCOCK (MISCELLANEOUS) ×2 IMPLANT
TUBING CIL FLEX 10 FLL-RA (TUBING) ×2 IMPLANT
WIRE EMERALD 3MM-J .035X150CM (WIRE) ×2 IMPLANT

## 2018-12-18 NOTE — Progress Notes (Signed)
Reviewed angiograms, cardiac cath results. History acute on chronic diastolic heart failure exacerbation due to a long episode of persistent atrial fibrillation, despite good ventricular rate control. Please start furosemide 40 mg once daily and potassium chloride 20 mEq daily. Repeat potassium today was normal, (the other lab was artifactual due to hemolysis). Repeat BMET in 2 weeks. He will likely only improve with return to normal rhythm.  He will require antiarrhythmics, cardioversion and anticoagulation.  He first needs to start anticoagulation. If he will not/cannot pay for Eliquis or Xarelto, we will have to do warfarin, although I think this is an inferior alternative.  We will make arrangements for follow-up in Coumadin clinic.  Maybe our pharmacists can find a way to get him a direct oral anticoagulant in an affordable fashion.  (He reports he does not qualify for the patient assistance program).  There are very few antiarrhythmic choices available due to the presence of coronary disease, heart failure and renal dysfunction.  I believe the best choice is amiodarone 400 mg once daily for 2 weeks, followed by 200 mg once daily indefinitely.  Schedule for electrical cardioversion once we have had 3 consecutive weekly INR results over 2.0.  Sanda Klein, MD, [facility] [phone] office [phone] pager

## 2018-12-18 NOTE — Discharge Instructions (Signed)
Femoral Site Care °This sheet gives you information about how to care for yourself after your procedure. Your health care provider may also give you more specific instructions. If you have problems or questions, contact your health care provider. °What can I expect after the procedure? °After the procedure, it is common to have: °· Bruising that usually fades within 1-2 weeks. °· Tenderness at the site. °Follow these instructions at home: °Wound care °· Follow instructions from your health care provider about how to take care of your insertion site. Make sure you: °? Wash your hands with soap and water before you change your bandage (dressing). If soap and water are not available, use hand sanitizer. °? Change your dressing as told by your health care provider. °? Leave stitches (sutures), skin glue, or adhesive strips in place. These skin closures may need to stay in place for 2 weeks or longer. If adhesive strip edges start to loosen and curl up, you may trim the loose edges. Do not remove adhesive strips completely unless your health care provider tells you to do that. °· Do not take baths, swim, or use a hot tub until your health care provider approves. °· You may shower 24-48 hours after the procedure or as told by your health care provider. °? Gently wash the site with plain soap and water. °? Pat the area dry with a clean towel. °? Do not rub the site. This may cause bleeding. °· Do not apply powder or lotion to the site. Keep the site clean and dry. °· Check your femoral site every day for signs of infection. Check for: °? Redness, swelling, or pain. °? Fluid or blood. °? Warmth. °? Pus or a bad smell. °Activity °· For the first 2-3 days after your procedure, or as long as directed: °? Avoid climbing stairs as much as possible. °? Do not squat. °· Do not lift anything that is heavier than 10 lb (4.5 kg), or the limit that you are told, until your health care provider says that it is safe. °· Rest as  directed. °? Avoid sitting for a long time without moving. Get up to take short walks every 1-2 hours. °· Do not drive for 24 hours if you were given a medicine to help you relax (sedative). °General instructions °· Take over-the-counter and prescription medicines only as told by your health care provider. °· Keep all follow-up visits as told by your health care provider. This is important. °Contact a health care provider if you have: °· A fever or chills. °· You have redness, swelling, or pain around your insertion site. °Get help right away if: °· The catheter insertion area swells very fast. °· You pass out. °· You suddenly start to sweat or your skin gets clammy. °· The catheter insertion area is bleeding, and the bleeding does not stop when you hold steady pressure on the area. °· The area near or just beyond the catheter insertion site becomes pale, cool, tingly, or numb. °These symptoms may represent a serious problem that is an emergency. Do not wait to see if the symptoms will go away. Get medical help right away. Call your local emergency services (911 in the [place] Do not drive yourself to the hospital. °Summary °· After the procedure, it is common to have bruising that usually fades within 1-2 weeks. °· Check your femoral site every day for signs of infection. °· Do not lift anything that is heavier than 10 lb (4.5 kg), or the   limit that you are told, until your health care provider says that it is safe. °This information is not intended to replace advice given to you by your health care provider. Make sure you discuss any questions you have with your health care provider. °Document Released: 09/26/2013 Document Revised: 02/05/2017 Document Reviewed: 02/05/2017 °Elsevier Patient Education © 2020 [employer] ° °

## 2018-12-18 NOTE — Interval H&P Note (Signed)
Cath Lab Visit (complete for each Cath Lab visit)  Clinical Evaluation Leading to the Procedure:   ACS: No.  Non-ACS:    Anginal Classification: CCS III  Anti-ischemic medical therapy: Maximal Therapy (2 or more classes of medications)  Non-Invasive Test Results: No non-invasive testing performed  Prior CABG: Previous CABG      History and Physical Interval Note:  12/18/2018 2:02 PM  [name]  has presented today for surgery, with the diagnosis of chest pain.  The various methods of treatment have been discussed with the patient and family. After consideration of risks, benefits and other options for treatment, the patient has consented to  Procedure(s): LEFT HEART CATH AND CORS/GRAFTS ANGIOGRAPHY (N/A) as a surgical intervention.  The patient's history has been reviewed, patient examined, no change in status, stable for surgery.  I have reviewed the patient's chart and labs.  Questions were answered to the patient's satisfaction.     Kathlyn Sacramento

## 2018-12-19 ENCOUNTER — Encounter (HOSPITAL_COMMUNITY): Payer: Self-pay | Admitting: Cardiovascular Disease

## 2018-12-19 ENCOUNTER — Telehealth: Payer: Self-pay | Admitting: *Deleted

## 2018-12-19 DIAGNOSIS — I480 Paroxysmal atrial fibrillation: Secondary | ICD-10-CM

## 2018-12-19 DIAGNOSIS — I10 Essential (primary) hypertension: Secondary | ICD-10-CM

## 2018-12-19 MED ORDER — POTASSIUM CHLORIDE ER 20 MEQ PO TBCR: 20.0000 meq | EXTENDED_RELEASE_TABLET | Freq: Every day | ORAL | 11 refills | Status: DC

## 2018-12-19 MED ORDER — AMIODARONE HCL 200 MG PO TABS: ORAL_TABLET | ORAL | 11 refills | Status: DC

## 2018-12-19 MED ORDER — FUROSEMIDE 40 MG PO TABS: 40.0000 mg | ORAL_TABLET | Freq: Every day | ORAL | 11 refills | Status: DC

## 2018-12-19 NOTE — Telephone Encounter (Signed)
New Message:    Please call, pt said he saw Dr C on [phone] and thought he hadsaid something about changing his medicine.

## 2018-12-19 NOTE — Telephone Encounter (Signed)
The patient has been called with the instructions and medication additions. An appointment has been made for 01/13/2019 at 9 am with Dr. Sallyanne Kuster. Medications have been sent in for him. He has been advised to call back with any questions.    Per Dr. Sallyanne Kuster: Please start furosemide 40 mg once daily and potassium chloride 20 mEq daily. Repeat potassium today was normal, (the other lab was artifactual due to hemolysis). Repeat BMET in 2 weeks. He will likely only improve with return to normal rhythm.  He will require antiarrhythmics, cardioversion and anticoagulation.  He first needs to start anticoagulation. If he will not/cannot pay for Eliquis or Xarelto, we will have to do warfarin, although I think this is an inferior alternative.  We will make arrangements for follow-up in Coumadin clinic.  Maybe our pharmacists can find a way to get him a direct oral anticoagulant in an affordable fashion.  (He reports he does not qualify for the patient assistance program).  There are very few antiarrhythmic choices available due to the presence of coronary disease, heart failure and renal dysfunction.  I believe the best choice is amiodarone 400 mg once daily for 2 weeks, followed by 200 mg once daily indefinitely.  Schedule for electrical cardioversion once we have had 3 consecutive weekly INR results over 2.0.

## 2018-12-19 NOTE — Telephone Encounter (Signed)
Left message for patient to call and schedule New Coumadin appointment

## 2018-12-25 ENCOUNTER — Other Ambulatory Visit: Payer: Self-pay | Admitting: Cardiovascular Disease

## 2018-12-27 ENCOUNTER — Ambulatory Visit (INDEPENDENT_AMBULATORY_CARE_PROVIDER_SITE_OTHER): Payer: Medicare HMO | Admitting: Pharmacist Clinician (PhC)/ Clinical Pharmacy Specialist

## 2018-12-27 ENCOUNTER — Other Ambulatory Visit: Payer: Self-pay

## 2018-12-27 DIAGNOSIS — I484 Atypical atrial flutter: Secondary | ICD-10-CM

## 2018-12-27 DIAGNOSIS — Z7901 Long term (current) use of anticoagulants: Secondary | ICD-10-CM

## 2018-12-27 MED ORDER — WARFARIN SODIUM 5 MG PO TABS: ORAL_TABLET | ORAL | 1 refills | Status: DC

## 2018-12-27 NOTE — Patient Instructions (Addendum)
Start warfarin first dose on Wed Nov 25.   Epic connection lost while patient was in the office, he was given hand written instructions and we will call with appointment information.

## 2018-12-30 ENCOUNTER — Telehealth: Payer: Self-pay

## 2018-12-30 ENCOUNTER — Ambulatory Visit (HOSPITAL_COMMUNITY): Payer: Medicare HMO | Attending: Cardiovascular Disease

## 2018-12-30 ENCOUNTER — Other Ambulatory Visit: Payer: Self-pay

## 2018-12-30 DIAGNOSIS — R0602 Shortness of breath: Secondary | ICD-10-CM | POA: Insufficient documentation

## 2018-12-30 DIAGNOSIS — I480 Paroxysmal atrial fibrillation: Secondary | ICD-10-CM | POA: Diagnosis not present

## 2018-12-30 NOTE — Telephone Encounter (Signed)
Called and lmomed the pt regarding their next appt. Instructed the pt to call back if they have any questions.

## 2018-12-30 NOTE — Progress Notes (Signed)
2D Echocardiogram has been performed. 

## 2019-01-06 ENCOUNTER — Other Ambulatory Visit: Payer: Self-pay

## 2019-01-06 ENCOUNTER — Telehealth: Payer: Medicare HMO | Admitting: Physician Assistant

## 2019-01-06 ENCOUNTER — Ambulatory Visit (INDEPENDENT_AMBULATORY_CARE_PROVIDER_SITE_OTHER): Payer: Medicare HMO | Admitting: Pharmacist Clinician (PhC)/ Clinical Pharmacy Specialist

## 2019-01-06 DIAGNOSIS — Z7901 Long term (current) use of anticoagulants: Secondary | ICD-10-CM | POA: Diagnosis not present

## 2019-01-06 DIAGNOSIS — I484 Atypical atrial flutter: Secondary | ICD-10-CM | POA: Diagnosis not present

## 2019-01-06 LAB — POCT INR: INR: 1.7 — AB (ref 2.0–3.0)

## 2019-01-06 NOTE — Patient Instructions (Signed)
Take 1 tablet daily except 1/2 tablet each Monday and Friday.  Repeat INR in 1 week

## 2019-01-08 ENCOUNTER — Ambulatory Visit (INDEPENDENT_AMBULATORY_CARE_PROVIDER_SITE_OTHER): Payer: Medicare HMO | Admitting: *Deleted

## 2019-01-08 DIAGNOSIS — I442 Atrioventricular block, complete: Secondary | ICD-10-CM | POA: Diagnosis not present

## 2019-01-08 LAB — CUP PACEART REMOTE DEVICE CHECK
Battery Impedance: 403 Ohm
Battery Remaining Longevity: 91 mo
Battery Voltage: 2.79 V
Brady Statistic AP VP Percent: 6 %
Brady Statistic AP VS Percent: 0 %
Brady Statistic AS VP Percent: 76 %
Brady Statistic AS VS Percent: 17 %
Date Time Interrogation Session: 20201202094039
Implantable Lead Implant Date: 20080208
Implantable Lead Implant Date: 20140130
Implantable Lead Location: 753859
Implantable Lead Location: 753860
Implantable Lead Model: 5076
Implantable Lead Model: 5076
Implantable Pulse Generator Implant Date: 20140130
Lead Channel Impedance Value: 461 Ohm
Lead Channel Impedance Value: 498 Ohm
Lead Channel Pacing Threshold Amplitude: 0.75 V
Lead Channel Pacing Threshold Amplitude: 0.875 V
Lead Channel Pacing Threshold Pulse Width: 0.4 ms
Lead Channel Pacing Threshold Pulse Width: 0.4 ms
Lead Channel Setting Pacing Amplitude: 2 V
Lead Channel Setting Pacing Amplitude: 2.5 V
Lead Channel Setting Pacing Pulse Width: 0.4 ms
Lead Channel Setting Sensing Sensitivity: 2 mV

## 2019-01-13 ENCOUNTER — Ambulatory Visit (INDEPENDENT_AMBULATORY_CARE_PROVIDER_SITE_OTHER): Payer: Medicare HMO | Admitting: Cardiovascular Disease

## 2019-01-13 ENCOUNTER — Other Ambulatory Visit (HOSPITAL_COMMUNITY)
Admission: RE | Admit: 2019-01-13 | Discharge: 2019-01-13 | Disposition: A | Discharge status: Home / Self Care | Payer: Medicare HMO | Source: Ambulatory Visit | Attending: Cardiovascular Disease | Admitting: Cardiovascular Disease

## 2019-01-13 ENCOUNTER — Encounter: Payer: Self-pay | Admitting: Cardiovascular Disease

## 2019-01-13 ENCOUNTER — Ambulatory Visit (INDEPENDENT_AMBULATORY_CARE_PROVIDER_SITE_OTHER): Payer: Medicare HMO | Admitting: Pharmacist

## 2019-01-13 ENCOUNTER — Other Ambulatory Visit: Payer: Self-pay

## 2019-01-13 VITALS — BP 124/72 | HR 63 | Temp 96.8°F | Ht 69.5 in | Wt 225.6 lb

## 2019-01-13 DIAGNOSIS — E7800 Pure hypercholesterolemia, unspecified: Secondary | ICD-10-CM

## 2019-01-13 DIAGNOSIS — Z01812 Encounter for preprocedural laboratory examination: Secondary | ICD-10-CM | POA: Diagnosis present

## 2019-01-13 DIAGNOSIS — I472 Ventricular tachycardia: Secondary | ICD-10-CM

## 2019-01-13 DIAGNOSIS — I484 Atypical atrial flutter: Secondary | ICD-10-CM

## 2019-01-13 DIAGNOSIS — I4729 Other ventricular tachycardia: Secondary | ICD-10-CM

## 2019-01-13 DIAGNOSIS — Z20828 Contact with and (suspected) exposure to other viral communicable diseases: Secondary | ICD-10-CM | POA: Insufficient documentation

## 2019-01-13 DIAGNOSIS — I4819 Other persistent atrial fibrillation: Secondary | ICD-10-CM | POA: Diagnosis not present

## 2019-01-13 DIAGNOSIS — Z7901 Long term (current) use of anticoagulants: Secondary | ICD-10-CM

## 2019-01-13 DIAGNOSIS — Z01818 Encounter for other preprocedural examination: Secondary | ICD-10-CM | POA: Diagnosis not present

## 2019-01-13 DIAGNOSIS — Z950 Presence of cardiac pacemaker: Secondary | ICD-10-CM

## 2019-01-13 DIAGNOSIS — E119 Type 2 diabetes mellitus without complications: Secondary | ICD-10-CM

## 2019-01-13 DIAGNOSIS — I25708 Atherosclerosis of coronary artery bypass graft(s), unspecified, with other forms of angina pectoris: Secondary | ICD-10-CM | POA: Diagnosis not present

## 2019-01-13 DIAGNOSIS — I495 Sick sinus syndrome: Secondary | ICD-10-CM | POA: Diagnosis not present

## 2019-01-13 DIAGNOSIS — I10 Essential (primary) hypertension: Secondary | ICD-10-CM

## 2019-01-13 DIAGNOSIS — E669 Obesity, unspecified: Secondary | ICD-10-CM

## 2019-01-13 LAB — BASIC METABOLIC PANEL
BUN/Creatinine Ratio: 33 — ABNORMAL HIGH (ref 10–24)
BUN: 58 mg/dL — ABNORMAL HIGH (ref 8–27)
CO2: 19 mmol/L — ABNORMAL LOW (ref 20–29)
Calcium: 9.7 mg/dL (ref 8.6–10.2)
Chloride: 102 mmol/L (ref 96–106)
Creatinine, Ser: 1.78 mg/dL — ABNORMAL HIGH (ref 0.76–1.27)
GFR calc Af Amer: 42 mL/min/{1.73_m2} — ABNORMAL LOW (ref >59)
GFR calc non Af Amer: 36 mL/min/{1.73_m2} — ABNORMAL LOW (ref >59)
Glucose: 158 mg/dL — ABNORMAL HIGH (ref 65–99)
Potassium: 5.4 mmol/L — ABNORMAL HIGH (ref 3.5–5.2)
Sodium: 135 mmol/L (ref 134–144)

## 2019-01-13 LAB — CBC
Hematocrit: 34.1 % — ABNORMAL LOW (ref 37.5–51.0)
Hemoglobin: 11.6 g/dL — ABNORMAL LOW (ref 13.0–17.7)
MCH: 31.9 pg (ref 26.6–33.0)
MCHC: 34 g/dL (ref 31.5–35.7)
MCV: 94 fL (ref 79–97)
Platelets: 166 10*3/uL (ref 150–450)
RBC: 3.64 x10E6/uL — ABNORMAL LOW (ref 4.14–5.80)
RDW: 12.3 % (ref 11.6–15.4)
WBC: 6.2 10*3/uL (ref 3.4–10.8)

## 2019-01-13 LAB — POCT INR: INR: 2.8 (ref 2.0–3.0)

## 2019-01-13 LAB — SARS CORONAVIRUS 2 (TAT 6-24 HRS): SARS Coronavirus 2: NEGATIVE

## 2019-01-13 NOTE — H&P (View-Only) (Signed)
Cardiology Office Note    Date:  01/13/2019   ID:  Alan [name], DOB 1940/05/02, MRN [id number]  PCP:  [name], MD  Cardiologist:  Sanda Klein, MD   No chief complaint on file.   History of Present Illness:  Alan [name] is a 78 y.o. male with coronary artery disease, previous CABG, sinus node arrest, status post dual-chamber permanent pacemaker ("atrially dependent"), type 2 diabetes mellitus and dyslipidemia, obstructive sleep apnea on CPAP, here for follow up after cardiac catheterization.  He has been feeling very poorly since September when he went into atrial fibrillation.  He has exertional dyspnea with light activity he occasionally has chest pressure, also with activity.  He does not have orthopnea or PND, or other complaints at rest.  He underwent cardiac catheterization on November 11.  Anatomy was unchanged.  The native LAD and RCA arteries are chronically occluded, but have improved arterial supply via the LIMA and SVG bypasses, respectively, both appearing free of disease.  There was a 99% stenosis in the distal left circumflex coronary artery, too small for PCI.  The LVEF was 50-55%, but the LVEDP was elevated at 18 mmHg.  He remains in persistent atrial fibrillation.  The atrial electrogram is quite regular suggesting probable atypical atrial flutter.  His INR today was 2.8.  This is the first therapeutic INR since warfarin was initiated.  He could not afford Eliquis.  Pacemaker interrogation also shows remarkable increase in ventricular pacing since the onset of atrial fibrillation,  86%.  Device function is otherwise normal with estimated generator longevity of 7.5 years.  Before the onset of atrial fibrillation he was "atrially pacemaker dependent).  He has been feeling poorly  (dyspnea with walking in the house, less prominent exertional chest tightness) for the last 3 months. This seems to correlate with recurrent atrial fibrillation, starting September 9  and persistent since then. Ventricular rate is well controlled.  Otherwise denies edema, syncope, angina, palpitations, falls, injuries or bleeding events..  Reports good glycemic control.  His pacemaker is functioning normally on a comprehensive check today.  The device was implanted in 2014 (Medtronic Adapta dual-chamber) and still has roughly 8 years of battery longevity.  He is "atrially dependent" without any detectable atrial activity and with 98% atrial pacing before onset of AFib (now w 47% AFib in last 6 months).  Ventricular pacing has increased during the AFib, from 2% to 21%.    He has a longstanding history of cardiac problems. He had CABG in 1990 (sequential LIMA to LAD and diagonal), multiple RCA PCI 1990-2004 and a redo CABG in 2004 (SVG-RCA-PDA, with surgical MAZE procedure, Dr. Amador Cunas). Cath 2007 and 2012 show occluded LAD and RCA with patent grafts, 60% ostial OM and diffuse stenoses distal LCX. 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 He had a generator changeout and ventricular lead revision in January 2014. He does not tolerate VVI pacing. He has had occasional nonsustained VT recorded by the device, always asymptomatic. He has had paroxysmal atrial fibrillation in the remote past. No history of stroke or TIA. Warfarin was stopped for recurrent GI bleeding requiring transfusions. Despite stopping warfarin anticoagulation, he had another episode of GI bleeding requiring transfusion in April 2015. He was seen at [facility] and underwent repeat endoscopy (endoscopic workup in February was  negative). He tells me they found "2 small holes in his stomach" which were cauterized. He has not had any bleeding events since. He remains off anti-coagulation.  He wears CPAP for OSA, has insulin requiring type II DM, mild  dyslipidemia intolerant to all statins, post-polio syndrome, lumbar spine surgery 01/2012.  He does not trust nuclear stress tests - reports they were repeatedly wrong in the past. [name] told him he should always have a cath rather than a stress test.    Past Medical History:  Diagnosis Date   Anticoagulant long-term use    eliquis   Arthritis    Arthropathy of left shoulder    Biceps tendonitis, left    Bursitis of left shoulder    Cardiac pacemaker in situ    first insertion 2008;  genertor and new lead change 03-08-2012;   Medtronic   CHB (complete heart block) ([place])    s/p  PPM 2008   Chronic back pain    CKD (chronic kidney disease), stage III    Coronary artery disease cardiologist--- dr Patte Winkel   hx  CABG x2  1990 and re-do 2004;  multiple PCI to [place] to 2004;    cardiac cath 2012  occluded LAD and RCA with patent grafts   History of coronary angioplasty    multiple PCI to RCA ,  1990 to 2004   History of GI bleed followed by GI-- Olevia Perches PA @ [facility] in [place]   01/ 2020  upper and lower GI bleed ;  s/p  EGD with cautery of jejunal bleed and blood transfusion's   History of kidney stones    Hx of echocardiogram 06/29/2008   EF 45-50%    Hypertension    Ischemic cardiomyopathy    previously reported , ef 40-45%;  2010--- ef 45-50%;   last echo 2017 55-60%   Mixed hyperlipidemia    Nocturia    OSA on CPAP    cpap, 12, last sleep study Jan2013, sees Dr. Keturah Barre   PAF (paroxysmal atrial fibrillation) ([place])    long hx PAF--- followed by dr Creig Landin   Post-polio syndrome    dx polio age 55;   09-30-2018  per pt occasional gait issues and occasion fall   Rotator cuff tear, left    S/P CABG x 2    1990 x2  and 2004  x2   Sinus node dysfunction ([place])    2015--- sinus node arrest  with intact AV conduction   Type 2 diabetes mellitus ([place])    followed by pcp    Past Surgical History:  Procedure Laterality Date     APPENDECTOMY  child   BLEPHAROPLASTY Bilateral 12/2014   upper eyelid   CARDIAC PACEMAKER PLACEMENT  2008   CARPAL TUNNEL RELEASE Bilateral 2015;  2016   CATARACT EXTRACTION W/ INTRAOCULAR LENS  IMPLANT, BILATERAL  2017   CORONARY ANGIOPLASTY  1990  to 2004   multiple to [place]    @ [place] (dr chitwood)   seq. LIMA to LAD and Diagonal   CORONARY ARTERY BYPASS GRAFT  2004     dr Amador Cunas   SVG to  RCA and PDA;  ALSO MAZE  PROCEDURE   ESOPHAGOGASTRODUODENOSCOPY Left 03/18/2013   Procedure: ESOPHAGOGASTRODUODENOSCOPY (EGD);  Surgeon: Cleotis Nipper, MD;  Location: [facility] ENDOSCOPY;  Service: Endoscopy;  Laterality: Left;   HARDWARE REMOVAL N/A 05/18/2016   Procedure: Removal of broken hardware Lumbar  three-four;  Surgeon: [name], MD;  Location: [place];  Service: Neurosurgery;  Laterality: N/A;   HERNIA REPAIR     LEFT HEART CATH AND CORS/GRAFTS ANGIOGRAPHY N/A 12/18/2018   Procedure: LEFT HEART CATH AND CORS/GRAFTS ANGIOGRAPHY;  Surgeon: Wellington Hampshire, MD;  Location: [place] CV LAB;  Service: Cardiovascular;  Laterality: N/A;   LEFT HEART CATHETERIZATION WITH CORONARY/GRAFT ANGIOGRAM N/A 12/21/2010   Procedure: LEFT HEART CATHETERIZATION WITH Beatrix Fetters;  Surgeon: Leonie Man, MD;  Location: [facility] CATH LAB;  Service: Cardiovascular;  Laterality: N/A;   LEG SURGERY Right 2002  approx.   lengthened his leg   LUMBAR LAMINECTOMY/DECOMPRESSION MICRODISCECTOMY  01/10/2012   Procedure: LUMBAR LAMINECTOMY/DECOMPRESSION MICRODISCECTOMY 1 LEVEL;  Surgeon: [name], MD;  Location: [place] NEURO ORS;  Service: Neurosurgery;  Laterality: Bilateral;  Lumbar three-four decompression, Posterior lateral fusion lumbar three-four, posterior spinus plate lumbar three-four   LUMBAR LAMINECTOMY/DECOMPRESSION MICRODISCECTOMY Left 05/18/2016   Procedure: Laminectomy and Foraminotomy - Lumbar two-lumbar three- Lumbar four-lumbar five-  left with removal hardware lumbar three-four;  Surgeon: [name], MD;  Location: [place];  Service: Neurosurgery;  Laterality: Left;   NASAL SEPTUM SURGERY  yrs ago   PACEMAKER REVISION N/A 03/07/2012   Procedure: PACEMAKER REVISION;  Surgeon: Sanda Klein, MD;  Location: [place] CATH LAB;  Service: Cardiovascular;  Laterality: N/A;   POSTERIOR LUMBAR FUSION  02-22-2017   dr Ronnald Ramp  @ [facility]   re-do laminectomy L2-3 and fusion   POSTERIOR LUMBAR FUSION  03-11-2018   dr Ronnald Ramp @ [name]   laminectomy L4-5;  fixation L2-S1 and fusion L3-S1   SHOULDER ARTHROSCOPY Right after 2016, pt unsure year   SHOULDER ARTHROSCOPY WITH OPEN ROTATOR CUFF REPAIR Right 03/30/2014   Procedure: RIGHT SHOULDER ARTHROSCOPY  OPEN ROTATOR CUFF REPAIR;  Surgeon: Kerin Salen, MD;  Location: [place];  Service: Orthopedics;  Laterality: Right;   SHOULDER ARTHROSCOPY WITH SUBACROMIAL DECOMPRESSION, ROTATOR CUFF REPAIR AND BICEP TENDON REPAIR Left 10/01/2018   Procedure: LEFT SHOULDER ARTHROSCOPY, DISTAL CLAVICLE EXCISION,SUBACROMIAL, DECOMPRESSION, RORATOR CUFF REPAIR, BICEPS TENODESIS;  Surgeon: Renette Butters, MD;  Location: [place];  Service: Orthopedics;  Laterality: Left;   SHOULDER OPEN ROTATOR CUFF REPAIR Right 03/30/2014   Procedure: ROTATOR CUFF REPAIR SHOULDER OPEN;  Surgeon: Kerin Salen, MD;  Location: [place];  Service: Orthopedics;  Laterality: Right;   TONSILLECTOMY  child   TOTAL KNEE ARTHROPLASTY Left 2006    Current Medications: Outpatient Medications Prior to Visit  Medication Sig Dispense Refill   amiodarone (PACERONE) 200 MG tablet Take 200 mg by mouth daily.     amLODipine (NORVASC) 10 MG tablet Take 10 mg by mouth daily.      Ascorbic Acid (VITAMIN C) 1000 MG tablet Take 1,000 mg by mouth daily.     carboxymethylcellulose (REFRESH PLUS) 0.5 % SOLN Place 1 drop into both eyes every morning.     carvedilol (COREG) 25 MG tablet Take 2 tablets by mouth twice daily (Patient taking  differently: Take 50 mg by mouth 2 (two) times daily with a meal. ) 360 tablet 3   diclofenac sodium (VOLTAREN) 1 % GEL Apply 2 g topically daily as needed (pain).      Flaxseed, Linseed, (FLAX SEED OIL) 1300 MG CAPS Take 1,300 mg by mouth daily.     fluticasone (FLONASE) 50 MCG/ACT nasal spray Place 2 sprays into both nostrils daily as needed for allergies.      furosemide (LASIX) 40 MG tablet  Take 1 tablet (40 mg total) by mouth daily. 30 tablet 11   glipiZIDE (GLUCOTROL) 10 MG tablet Take 10 mg by mouth 2 (two) times daily.      hydrochlorothiazide (HYDRODIURIL) 25 MG tablet Take 25 mg by mouth daily.      insulin lispro (HUMALOG) 100 UNIT/ML injection Inject 4-5 Units into the skin daily as needed (steroids injections).     Lancets (FREESTYLE) lancets Use to check blood sugar 4 time(s) daily.     losartan (COZAAR) 100 MG tablet Take 100 mg by mouth daily.      metFORMIN (GLUCOPHAGE) 1000 MG tablet Take 500 mg by mouth 2 (two) times a day.      metFORMIN (GLUCOPHAGE-XR) 500 MG 24 hr tablet Take 500 mg by mouth 2 (two) times a day. Take 1 po bid     Multiple Vitamin (MULTIVITAMIN WITH MINERALS) TABS tablet Take 1 tablet by mouth daily. Take 1 tablet po bid     potassium chloride 20 MEQ TBCR Take 20 mEq by mouth daily. 30 tablet 11   TRIAMCINOLONE ACETONIDE, TOP, (TRIANEX) 0.05 % OINT Apply 1 application topically at bedtime.     warfarin (COUMADIN) 5 MG tablet Take 1 tablet by mouth daily or as directed by coumadin clinic.  First dose on Wed Nov 25 30 tablet 1   amiodarone (PACERONE) 200 MG tablet Take 400 mg (2 tablets) once daily for the first 14 days, then take 200 mg (one tablet) once daily (Patient not taking: Reported on 01/13/2019) 45 tablet 11   Facility-Administered Medications Prior to Visit  Medication Dose Route Frequency Provider Last Rate Last Dose   sodium chloride flush (NS) 0.9 % injection 3 mL  3 mL Intravenous Q12H Tatijana Bierly, MD         Allergies:    Patient has no known allergies.   Social History   Socioeconomic History   Marital status: Single    Spouse name: Not on file   Number of children: 1   Years of education: 16   Highest education level: Not on file  Occupational History   Occupation: pilot-retired  Scientist, product/process development strain: Not on file   Food insecurity    Worry: Not on file    Inability: Not on file   Transportation needs    Medical: Not on file    Non-medical: Not on file  Tobacco Use   Smoking status: Never Smoker   Smokeless tobacco: Never Used  Substance and Sexual Activity   Alcohol use: Yes    Comment: rarely   Drug use: Never   Sexual activity: Yes  Lifestyle   Physical activity    Days per week: Not on file    Minutes per session: Not on file   Stress: Not on file  Relationships   Social connections    Talks on phone: Not on file    Gets together: Not on file    Attends religious service: Not on file    Active member of club or organization: Not on file    Attends meetings of clubs or organizations: Not on file    Relationship status: Not on file  Other Topics Concern   Not on file  Social History Narrative   Lives on [address] [facility], fishes.    Right-handed.   1 cup caffeine per day.     Family History:  The patient's family history includes Cancer in his mother; Other in his  father.   ROS:   Please see the history of present illness.    ROS All other systems are reviewed and are negative.  PHYSICAL EXAM:   VS:  BP 124/72    Pulse 63    Temp (!) 96.8 F (36 C)    Ht 5' 9.5" (1.765 m)    Wt 225 lb 9.6 oz (102.3 kg)    SpO2 98%    BMI 32.84 kg/m      General: Alert, oriented x3, no distress, obese, healthy pacemaker site Head: no evidence of trauma, PERRL, EOMI, no exophtalmos or lid lag, no myxedema, no xanthelasma; normal ears, nose and oropharynx Neck: normal jugular venous pulsations and no hepatojugular reflux; brisk carotid pulses  without delay and no carotid bruits Chest: clear to auscultation, no signs of consolidation by percussion or palpation, normal fremitus, symmetrical and full respiratory excursions Cardiovascular: normal position and quality of the apical impulse, regular rhythm, normal first and second heart sounds, no murmurs, rubs or gallops Abdomen: no tenderness or distention, no masses by palpation, no abnormal pulsatility or arterial bruits, normal bowel sounds, no hepatosplenomegaly Extremities: no clubbing, cyanosis or edema; 2+ radial, ulnar and brachial pulses bilaterally; 2+ right femoral, posterior tibial and dorsalis pedis pulses; 2+ left femoral, posterior tibial and dorsalis pedis pulses; no subclavian or femoral bruits Neurological: grossly nonfocal (postpolio sd inferior extremities asymmetry) Psych: Normal mood and affect    Wt Readings from Last 3 Encounters:  01/13/19 225 lb 9.6 oz (102.3 kg)  12/18/18 215 lb 11.2 oz (97.8 kg)  12/16/18 225 lb (102.1 kg)     Studies/Labs Reviewed:   EKG:  EKG is ordered today.  It shows background atrial fibrillation with 100% V paced rhythm  Recent Labs: 12/16/2018: Hemoglobin 11.0; Platelets 152 12/18/2018: BUN 31; Creatinine, Ser 1.21; Potassium 4.3; Sodium 135   Lipid Panel     Component Value Date/Time   CHOL 134 12/20/2010 0903   TRIG 104 12/20/2010 0903   HDL 38 (L) 12/20/2010 0903   CHOLHDL 3.5 12/20/2010 0903   VLDL 21 12/20/2010 0903   LDLCALC 75 12/20/2010 0903    10/30/2017 Creat 1.34, GFR 51. CO2 19, K 5.1. 10/16/2017 Hgb A1c 6.5% Lipid Panel  Dr. Melford Aase, December 2018 Total cholesterol 162, triglycerides 1 7, HDL, LDL 95 Creatinine 1.45, hemoglobin 11.3  ASSESSMENT:    1. Coronary artery disease of bypass graft of native heart with stable angina pectoris (HCC)   2. Persistent atrial fibrillation ([place])   3. Pre-op testing   4. SSS (sick sinus syndrome) ([place])   5. NSVT (nonsustained ventricular tachycardia) (HCC)   6.  Pacemaker - Medtronic Adapta dual chamber   7. Essential hypertension   8. Controlled type 2 diabetes mellitus without complication, without long-term current use of insulin ([place])   9. Hypercholesterolemia   10. Mild obesity      PLAN:  In order of problems listed above:  1. CAD s/p redo CABG with exertional angina: Symptom onset coincides with atrial fibrillation.  Cardiac catheterization did not show any change in anatomy.  No major myocardial territory without good arterial supply but may have a small area of ischemic myocardium in the posterior wall.  Prior to atrial fibrillation onset, symptoms are well controlled with antianginal medications.  2.  AFib: Recurrent persistent atrial fibrillation, highly symptomatic despite rate control. CHADSVasc score 4 (age, vascular disease, HTN, DM).  Achieve therapeutic anticoagulation level as of today, but does not want to wait for 3 more  weeks of poor functional status.  We will schedule for TEE prior to attempted overdrive pacing and cardioversion. This procedure has been fully reviewed with the patient and written informed consent has been obtained.  Afterwards, consider referral for redo endocardial ablation, especially since the atrial electrogram suggests atypical flutter.  Has history of GI bleeding related anemia in the past.  After cardioversion, will refer for EP evaluation for redo ablation and/or watchman device.  3. Sinus node arrest/CHB: Reportedly his device was implanted for complete heart block, he is but he really has a sinus node arrest and he is "atrially dependent" when not in AFib. The prevalence of V pacing has increased during AFib, and this may be contributing to his poor functional status, exertional angina and exertional dyspnea.  4. NSVT: Episodes are infrequent, a few times a year, consistently asymptomatic. None recently.  5. PPM: We will perform another download 2 weeks after his cardioversion, otherwise continue remote  downloads every 3 months.  Normal device function.  6. HTN:  Target systolic blood pressure less than 130, a little high today. Will have to hold diuretics for cath. Reassess after cath +/- cardioversion.  7. DM: reports good control.  Most recent hemoglobin A1c 7.1%.  8. HLP: Intolerant to ever single available statin.  We have discussed PCSK9 inhibitors but the cost is prohibitive to him.  9. Obesity: weight up and down.    Medication Adjustments/Labs and Tests Ordered: Current medicines are reviewed at length with the patient today.  Concerns regarding medicines are outlined above.  Medication changes, Labs and Tests ordered today are listed in the Patient Instructions below. Patient Instructions  Medication Instructions:  No changes *If you need a refill on your cardiac medications before your next appointment, please call your pharmacy*  Lab Work: Your provider would like for you to have the following labs today: CBC and BMET  If you have labs (blood work) drawn today and your tests are completely normal, you will receive your results only by:  MyChart Message (if you have MyChart) OR  A paper copy in the mail If you have any lab test that is abnormal or we need to change your treatment, we will call you to review the results.   Follow-Up: At [facility], you and your health needs are our priority.  As part of our continuing mission to provide you with exceptional heart care, we have created designated Provider Care Teams.  These Care Teams include your primary Cardiologist (physician) and Advanced Practice Providers (APPs -  Physician Assistants and Nurse Practitioners) who all work together to provide you with the care you need, when you need it.  Your next appointment:   4 week(s)  The format for your next appointment:   In Person  Provider:   Sanda Klein, MD  Other Instructions  Dear Alan [name],  You are scheduled for a TEE and Cardioversion on 01/15/2019  with Dr. Sallyanne Kuster.  Please arrive at the [facility] (Main Entrance A) at [facility]: [place], [place] [address] at 1 pm. (1 hour prior to procedure)  DIET: Nothing to eat or drink after midnight except a sip of water with medications (see medication instructions below)  Medication Instructions: Hold Glipizide the morning of the procedure  Continue your anticoagulant: Warfarin. Do not miss a dose. If you do please call the office immediatly.  You will need to continue your anticoagulant after your procedure until you  are told by your provider  that it is safe to stop   Labs:   You will need to have the coronavirus test completed prior to your procedure. An appointment has been made at 10:55 on 01/13/2019. This is a Drive Up Visit at the [facility] [address] Someone will direct you to the appropriate testing line. Please tell them that you are there for procedure testing. Stay in your car and someone will be with you shortly. Please make sure to have all other labs completed before this test because you will need to stay quarantined until your procedure.   You must have a responsible person to drive you home and stay in the waiting area during your procedure. Failure to do so could result in cancellation.  Bring your insurance cards.  *Special Note: Every effort is made to have your procedure done on time. Occasionally there are emergencies that occur at the hospital that may cause delays. Please be patient if a delay does occur.       Signed, Sanda Klein, MD  01/13/2019 10:54 AM    [facility] [address] [place], [place], [place]  [address] Phone: [phone]; Fax: [phone]

## 2019-01-13 NOTE — Progress Notes (Signed)
Cardiology Office Note    Date:  01/13/2019   ID:  Alan [name], DOB [birth year], MRN [id number]  PCP:  [name], MD  Cardiologist:  Sanda Klein, MD   No chief complaint on file.   History of Present Illness:  Alan [name] is a 78 y.o. male with coronary artery disease, previous CABG, sinus node arrest, status post dual-chamber permanent pacemaker ("atrially dependent"), type 2 diabetes mellitus and dyslipidemia, obstructive sleep apnea on CPAP, here for follow up after cardiac catheterization.  He has been feeling very poorly since September when he went into atrial fibrillation.  He has exertional dyspnea with light activity he occasionally has chest pressure, also with activity.  He does not have orthopnea or PND, or other complaints at rest.  He underwent cardiac catheterization on November 11.  Anatomy was unchanged.  The native LAD and RCA arteries are chronically occluded, but have improved arterial supply via the LIMA and SVG bypasses, respectively, both appearing free of disease.  There was a 99% stenosis in the distal left circumflex coronary artery, too small for PCI.  The LVEF was 50-55%, but the LVEDP was elevated at 18 mmHg.  He remains in persistent atrial fibrillation.  The atrial electrogram is quite regular suggesting probable atypical atrial flutter.  His INR today was 2.8.  This is the first therapeutic INR since warfarin was initiated.  He could not afford Eliquis.  Pacemaker interrogation also shows remarkable increase in ventricular pacing since the onset of atrial fibrillation,  86%.  Device function is otherwise normal with estimated generator longevity of 7.5 years.  Before the onset of atrial fibrillation he was "atrially pacemaker dependent).  He has been feeling poorly  (dyspnea with walking in the house, less prominent exertional chest tightness) for the last 3 months. This seems to correlate with recurrent atrial fibrillation, starting September 9  and persistent since then. Ventricular rate is well controlled.  Otherwise denies edema, syncope, angina, palpitations, falls, injuries or bleeding events..  Reports good glycemic control.  His pacemaker is functioning normally on a comprehensive check today.  The device was implanted in 2014 (Medtronic Adapta dual-chamber) and still has roughly 8 years of battery longevity.  He is "atrially dependent" without any detectable atrial activity and with 98% atrial pacing before onset of AFib (now w 47% AFib in last 6 months).  Ventricular pacing has increased during the AFib, from 2% to 21%.    He has a longstanding history of cardiac problems. He had CABG in 1990 (sequential LIMA to LAD and diagonal), multiple RCA PCI 1990-2004 and a redo CABG in 2004 (SVG-RCA-PDA, with surgical MAZE procedure, Dr. Amador Cunas). Cath 2007 and 2012 show occluded LAD and RCA with patent grafts, 60% ostial OM and diffuse stenoses distal LCX. 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 He had a generator changeout and ventricular lead revision in January 2014. He does not tolerate VVI pacing. He has had occasional nonsustained VT recorded by the device, always asymptomatic. He has had paroxysmal atrial fibrillation in the remote past. No history of stroke or TIA. Warfarin was stopped for recurrent GI bleeding requiring transfusions. Despite stopping warfarin anticoagulation, he had another episode of GI bleeding requiring transfusion in April 2015. He was seen at [facility] and underwent repeat endoscopy (endoscopic workup in February was  negative). He tells me they found "2 small holes in his stomach" which were cauterized. He has not had any bleeding events since. He remains off anti-coagulation.  He wears CPAP for OSA, has insulin requiring type II DM, mild  dyslipidemia intolerant to all statins, post-polio syndrome, lumbar spine surgery 01/2012.  He does not trust nuclear stress tests - reports they were repeatedly wrong in the past. [name] told him he should always have a cath rather than a stress test.    Past Medical History:  Diagnosis Date   Anticoagulant long-term use    eliquis   Arthritis    Arthropathy of left shoulder    Biceps tendonitis, left    Bursitis of left shoulder    Cardiac pacemaker in situ    first insertion 2008;  genertor and new lead change 03-08-2012;   Medtronic   CHB (complete heart block) ([place])    s/p  PPM 2008   Chronic back pain    CKD (chronic kidney disease), stage III    Coronary artery disease cardiologist--- dr Alan [name]   hx  CABG x2  1990 and re-do 2004;  multiple PCI to [place] to 2004;    cardiac cath 2012  occluded LAD and RCA with patent grafts   History of coronary angioplasty    multiple PCI to RCA ,  1990 to 2004   History of GI bleed followed by GI-- Alan Perches PA @ [facility] in [place]   01/ 2020  upper and lower GI bleed ;  s/p  EGD with cautery of jejunal bleed and blood transfusion's   History of kidney stones    Hx of echocardiogram 06/29/2008   EF 45-50%    Hypertension    Ischemic cardiomyopathy    previously reported , ef 40-45%;  2010--- ef 45-50%;   last echo 2017 55-60%   Mixed hyperlipidemia    Nocturia    OSA on CPAP    cpap, 12, last sleep study Jan2013, sees Dr. Keturah [name]   PAF (paroxysmal atrial fibrillation) ([place])    long hx PAF--- followed by dr Susie Pousson   Post-polio syndrome    dx polio age 89;   09-30-2018  per pt occasional gait issues and occasion fall   Rotator cuff tear, left    S/P CABG x 2    1990 x2  and 2004  x2   Sinus node dysfunction ([place])    2015--- sinus node arrest  with intact AV conduction   Type 2 diabetes mellitus ([place])    followed by pcp    Past Surgical History:  Procedure Laterality Date    APPENDECTOMY  child   BLEPHAROPLASTY Bilateral 12/2014   upper eyelid   CARDIAC PACEMAKER PLACEMENT  2008   CARPAL TUNNEL RELEASE Bilateral 2015;  2016   CATARACT EXTRACTION W/ INTRAOCULAR LENS  IMPLANT, BILATERAL  2017   CORONARY ANGIOPLASTY  1990  to 2004   multiple to [address]    @ [place] (dr chitwood)   seq. LIMA to LAD and Diagonal   CORONARY ARTERY BYPASS GRAFT  2004     dr Amador Cunas   SVG to  RCA and PDA;  ALSO MAZE  PROCEDURE   ESOPHAGOGASTRODUODENOSCOPY Left 03/18/2013   Procedure: ESOPHAGOGASTRODUODENOSCOPY (EGD);  Surgeon: Cleotis Nipper, MD;  Location: [facility] ENDOSCOPY;  Service: Endoscopy;  Laterality: Left;   HARDWARE REMOVAL N/A 05/18/2016   Procedure: Removal of broken hardware Lumbar three-four;  Surgeon: [name], MD;  Location: [place];  Service: Neurosurgery;  Laterality: N/A;   HERNIA REPAIR     LEFT HEART CATH AND CORS/GRAFTS ANGIOGRAPHY N/A 12/18/2018   Procedure: LEFT HEART CATH AND CORS/GRAFTS ANGIOGRAPHY;  Surgeon: Wellington Hampshire, MD;  Location: [facility] CV LAB;  Service: Cardiovascular;  Laterality: N/A;   LEFT HEART CATHETERIZATION WITH CORONARY/GRAFT ANGIOGRAM N/A 12/21/2010   Procedure: LEFT HEART CATHETERIZATION WITH Beatrix Fetters;  Surgeon: Leonie Man, MD;  Location: [facility] CATH LAB;  Service: Cardiovascular;  Laterality: N/A;   LEG SURGERY Right 2002  approx.   lengthened his leg   LUMBAR LAMINECTOMY/DECOMPRESSION MICRODISCECTOMY  01/10/2012   Procedure: LUMBAR LAMINECTOMY/DECOMPRESSION MICRODISCECTOMY 1 LEVEL;  Surgeon: [name], MD;  Location: [facility] NEURO ORS;  Service: Neurosurgery;  Laterality: Bilateral;  Lumbar three-four decompression, Posterior lateral fusion lumbar three-four, posterior spinus plate lumbar three-four   LUMBAR LAMINECTOMY/DECOMPRESSION MICRODISCECTOMY Left 05/18/2016   Procedure: Laminectomy and Foraminotomy - Lumbar two-lumbar three- Lumbar four-lumbar five-  left with removal hardware lumbar three-four;  Surgeon: [name], MD;  Location: [place];  Service: Neurosurgery;  Laterality: Left;   NASAL SEPTUM SURGERY  yrs ago   PACEMAKER REVISION N/A 03/07/2012   Procedure: PACEMAKER REVISION;  Surgeon: Sanda Klein, MD;  Location: [place] CATH LAB;  Service: Cardiovascular;  Laterality: N/A;   POSTERIOR LUMBAR FUSION  02-22-2017   dr Ronnald Ramp  @ [facility]   re-do laminectomy L2-3 and fusion   POSTERIOR LUMBAR FUSION  03-11-2018   dr Ronnald Ramp @ [facility]   laminectomy L4-5;  fixation L2-S1 and fusion L3-S1   SHOULDER ARTHROSCOPY Right after 2016, pt unsure year   SHOULDER ARTHROSCOPY WITH OPEN ROTATOR CUFF REPAIR Right 03/30/2014   Procedure: RIGHT SHOULDER ARTHROSCOPY  OPEN ROTATOR CUFF REPAIR;  Surgeon: Kerin Salen, MD;  Location: [place];  Service: Orthopedics;  Laterality: Right;   SHOULDER ARTHROSCOPY WITH SUBACROMIAL DECOMPRESSION, ROTATOR CUFF REPAIR AND BICEP TENDON REPAIR Left 10/01/2018   Procedure: LEFT SHOULDER ARTHROSCOPY, DISTAL CLAVICLE EXCISION,SUBACROMIAL, DECOMPRESSION, RORATOR CUFF REPAIR, BICEPS TENODESIS;  Surgeon: Renette Butters, MD;  Location: [place];  Service: Orthopedics;  Laterality: Left;   SHOULDER OPEN ROTATOR CUFF REPAIR Right 03/30/2014   Procedure: ROTATOR CUFF REPAIR SHOULDER OPEN;  Surgeon: Kerin Salen, MD;  Location: [place];  Service: Orthopedics;  Laterality: Right;   TONSILLECTOMY  child   TOTAL KNEE ARTHROPLASTY Left 2006    Current Medications: Outpatient Medications Prior to Visit  Medication Sig Dispense Refill   amiodarone (PACERONE) 200 MG tablet Take 200 mg by mouth daily.     amLODipine (NORVASC) 10 MG tablet Take 10 mg by mouth daily.      Ascorbic Acid (VITAMIN C) 1000 MG tablet Take 1,000 mg by mouth daily.     carboxymethylcellulose (REFRESH PLUS) 0.5 % SOLN Place 1 drop into both eyes every morning.     carvedilol (COREG) 25 MG tablet Take 2 tablets by mouth twice daily (Patient taking  differently: Take 50 mg by mouth 2 (two) times daily with a meal. ) 360 tablet 3   diclofenac sodium (VOLTAREN) 1 % GEL Apply 2 g topically daily as needed (pain).      Flaxseed, Linseed, (FLAX SEED OIL) 1300 MG CAPS Take 1,300 mg by mouth daily.     fluticasone (FLONASE) 50 MCG/ACT nasal spray Place 2 sprays into both nostrils daily as needed for allergies.      furosemide (LASIX) 40 MG tablet Take 1  tablet (40 mg total) by mouth daily. 30 tablet 11   glipiZIDE (GLUCOTROL) 10 MG tablet Take 10 mg by mouth 2 (two) times daily.      hydrochlorothiazide (HYDRODIURIL) 25 MG tablet Take 25 mg by mouth daily.      insulin lispro (HUMALOG) 100 UNIT/ML injection Inject 4-5 Units into the skin daily as needed (steroids injections).     Lancets (FREESTYLE) lancets Use to check blood sugar 4 time(s) daily.     losartan (COZAAR) 100 MG tablet Take 100 mg by mouth daily.      metFORMIN (GLUCOPHAGE) 1000 MG tablet Take 500 mg by mouth 2 (two) times a day.      metFORMIN (GLUCOPHAGE-XR) 500 MG 24 hr tablet Take 500 mg by mouth 2 (two) times a day. Take 1 po bid     Multiple Vitamin (MULTIVITAMIN WITH MINERALS) TABS tablet Take 1 tablet by mouth daily. Take 1 tablet po bid     potassium chloride 20 MEQ TBCR Take 20 mEq by mouth daily. 30 tablet 11   TRIAMCINOLONE ACETONIDE, TOP, (TRIANEX) 0.05 % OINT Apply 1 application topically at bedtime.     warfarin (COUMADIN) 5 MG tablet Take 1 tablet by mouth daily or as directed by coumadin clinic.  First dose on Wed Nov 25 30 tablet 1   amiodarone (PACERONE) 200 MG tablet Take 400 mg (2 tablets) once daily for the first 14 days, then take 200 mg (one tablet) once daily (Patient not taking: Reported on 01/13/2019) 45 tablet 11   Facility-Administered Medications Prior to Visit  Medication Dose Route Frequency Provider Last Rate Last Dose   sodium chloride flush (NS) 0.9 % injection 3 mL  3 mL Intravenous Q12H Chalene Treu, MD         Allergies:    Patient has no known allergies.   Social History   Socioeconomic History   Marital status: Single    Spouse name: Not on file   Number of children: 1   Years of education: 16   Highest education level: Not on file  Occupational History   Occupation: pilot-retired  Scientist, product/process development strain: Not on file   Food insecurity    Worry: Not on file    Inability: Not on file   Transportation needs    Medical: Not on file    Non-medical: Not on file  Tobacco Use   Smoking status: Never Smoker   Smokeless tobacco: Never Used  Substance and Sexual Activity   Alcohol use: Yes    Comment: rarely   Drug use: Never   Sexual activity: Yes  Lifestyle   Physical activity    Days per week: Not on file    Minutes per session: Not on file   Stress: Not on file  Relationships   Social connections    Talks on phone: Not on file    Gets together: Not on file    Attends religious service: Not on file    Active member of club or organization: Not on file    Attends meetings of clubs or organizations: Not on file    Relationship status: Not on file  Other Topics Concern   Not on file  Social History Narrative   Lives on [address] @ [facility], fishes.    Right-handed.   1 cup caffeine per day.     Family History:  The patient's family history includes Cancer in his mother; Other in his father.  ROS:   Please see the history of present illness.    ROS All other systems are reviewed and are negative.  PHYSICAL EXAM:   VS:  BP 124/72    Pulse 63    Temp (!) 96.8 F (36 C)    Ht 5' 9.5" (1.765 m)    Wt 225 lb 9.6 oz (102.3 kg)    SpO2 98%    BMI 32.84 kg/m      General: Alert, oriented x3, no distress, obese, healthy pacemaker site Head: no evidence of trauma, PERRL, EOMI, no exophtalmos or lid lag, no myxedema, no xanthelasma; normal ears, nose and oropharynx Neck: normal jugular venous pulsations and no hepatojugular reflux; brisk carotid pulses  without delay and no carotid bruits Chest: clear to auscultation, no signs of consolidation by percussion or palpation, normal fremitus, symmetrical and full respiratory excursions Cardiovascular: normal position and quality of the apical impulse, regular rhythm, normal first and second heart sounds, no murmurs, rubs or gallops Abdomen: no tenderness or distention, no masses by palpation, no abnormal pulsatility or arterial bruits, normal bowel sounds, no hepatosplenomegaly Extremities: no clubbing, cyanosis or edema; 2+ radial, ulnar and brachial pulses bilaterally; 2+ right femoral, posterior tibial and dorsalis pedis pulses; 2+ left femoral, posterior tibial and dorsalis pedis pulses; no subclavian or femoral bruits Neurological: grossly nonfocal (postpolio sd inferior extremities asymmetry) Psych: Normal mood and affect    Wt Readings from Last 3 Encounters:  01/13/19 225 lb 9.6 oz (102.3 kg)  12/18/18 215 lb 11.2 oz (97.8 kg)  12/16/18 225 lb (102.1 kg)     Studies/Labs Reviewed:   EKG:  EKG is ordered today.  It shows background atrial fibrillation with 100% V paced rhythm  Recent Labs: 12/16/2018: Hemoglobin 11.0; Platelets 152 12/18/2018: BUN 31; Creatinine, Ser 1.21; Potassium 4.3; Sodium 135   Lipid Panel     Component Value Date/Time   CHOL 134 12/20/2010 0903   TRIG 104 12/20/2010 0903   HDL 38 (L) 12/20/2010 0903   CHOLHDL 3.5 12/20/2010 0903   VLDL 21 12/20/2010 0903   LDLCALC 75 12/20/2010 0903    10/30/2017 Creat 1.34, GFR 51. CO2 19, K 5.1. 10/16/2017 Hgb A1c 6.5% Lipid Panel  Dr. Melford Aase, December 2018 Total cholesterol 162, triglycerides 1 7, HDL, LDL 95 Creatinine 1.45, hemoglobin 11.3  ASSESSMENT:    1. Coronary artery disease of bypass graft of native heart with stable angina pectoris (HCC)   2. Persistent atrial fibrillation ([place])   3. Pre-op testing   4. SSS (sick sinus syndrome) ([place])   5. NSVT (nonsustained ventricular tachycardia) (HCC)   6.  Pacemaker - Medtronic Adapta dual chamber   7. Essential hypertension   8. Controlled type 2 diabetes mellitus without complication, without long-term current use of insulin ([place])   9. Hypercholesterolemia   10. Mild obesity      PLAN:  In order of problems listed above:  1. CAD s/p redo CABG with exertional angina: Symptom onset coincides with atrial fibrillation.  Cardiac catheterization did not show any change in anatomy.  No major myocardial territory without good arterial supply but may have a small area of ischemic myocardium in the posterior wall.  Prior to atrial fibrillation onset, symptoms are well controlled with antianginal medications.  2.  AFib: Recurrent persistent atrial fibrillation, highly symptomatic despite rate control. CHADSVasc score 4 (age, vascular disease, HTN, DM).  Achieve therapeutic anticoagulation level as of today, but does not want to wait for 3 more weeks of poor  functional status.  We will schedule for TEE prior to attempted overdrive pacing and cardioversion. This procedure has been fully reviewed with the patient and written informed consent has been obtained.  Afterwards, consider referral for redo endocardial ablation, especially since the atrial electrogram suggests atypical flutter.  Has history of GI bleeding related anemia in the past.  After cardioversion, will refer for EP evaluation for redo ablation and/or watchman device.  3. Sinus node arrest/CHB: Reportedly his device was implanted for complete heart block, he is but he really has a sinus node arrest and he is "atrially dependent" when not in AFib. The prevalence of V pacing has increased during AFib, and this may be contributing to his poor functional status, exertional angina and exertional dyspnea.  4. NSVT: Episodes are infrequent, a few times a year, consistently asymptomatic. None recently.  5. PPM: We will perform another download 2 weeks after his cardioversion, otherwise continue remote  downloads every 3 months.  Normal device function.  6. HTN:  Target systolic blood pressure less than 130, a little high today. Will have to hold diuretics for cath. Reassess after cath +/- cardioversion.  7. DM: reports good control.  Most recent hemoglobin A1c 7.1%.  8. HLP: Intolerant to ever single available statin.  We have discussed PCSK9 inhibitors but the cost is prohibitive to him.  9. Obesity: weight up and down.    Medication Adjustments/Labs and Tests Ordered: Current medicines are reviewed at length with the patient today.  Concerns regarding medicines are outlined above.  Medication changes, Labs and Tests ordered today are listed in the Patient Instructions below. Patient Instructions  Medication Instructions:  No changes *If you need a refill on your cardiac medications before your next appointment, please call your pharmacy*  Lab Work: Your provider would like for you to have the following labs today: CBC and BMET  If you have labs (blood work) drawn today and your tests are completely normal, you will receive your results only by:  MyChart Message (if you have MyChart) OR  A paper copy in the mail If you have any lab test that is abnormal or we need to change your treatment, we will call you to review the results.   Follow-Up: At [facility], you and your health needs are our priority.  As part of our continuing mission to provide you with exceptional heart care, we have created designated Provider Care Teams.  These Care Teams include your primary Cardiologist (physician) and Advanced Practice Providers (APPs -  Physician Assistants and Nurse Practitioners) who all work together to provide you with the care you need, when you need it.  Your next appointment:   4 week(s)  The format for your next appointment:   In Person  Provider:   Sanda Klein, MD  Other Instructions  Dear Mr. Endicott,  You are scheduled for a TEE and Cardioversion on 01/15/2019  with Dr. Sallyanne Kuster.  Please arrive at the [facility] (Main Entrance A) at [facility]: [place], [place] [address] at 1 pm. (1 hour prior to procedure)  DIET: Nothing to eat or drink after midnight except a sip of water with medications (see medication instructions below)  Medication Instructions: Hold Glipizide the morning of the procedure  Continue your anticoagulant: Warfarin. Do not miss a dose. If you do please call the office immediatly.  You will need to continue your anticoagulant after your procedure until you  are told by your provider that it is  safe to stop   Labs:   You will need to have the coronavirus test completed prior to your procedure. An appointment has been made at 10:55 on 01/13/2019. This is a Drive Up Visit at the [facility] [address] Someone will direct you to the appropriate testing line. Please tell them that you are there for procedure testing. Stay in your car and someone will be with you shortly. Please make sure to have all other labs completed before this test because you will need to stay quarantined until your procedure.   You must have a responsible person to drive you home and stay in the waiting area during your procedure. Failure to do so could result in cancellation.  Bring your insurance cards.  *Special Note: Every effort is made to have your procedure done on time. Occasionally there are emergencies that occur at the hospital that may cause delays. Please be patient if a delay does occur.       Signed, Sanda Klein, MD  01/13/2019 10:54 AM    [facility] [place], [place], [place]  [address] Phone: [phone]; Fax: [phone]

## 2019-01-13 NOTE — Patient Instructions (Signed)
Medication Instructions:  No changes *If you need a refill on your cardiac medications before your next appointment, please call your pharmacy*  Lab Work: Your provider would like for you to have the following labs today: CBC and BMET  If you have labs (blood work) drawn today and your tests are completely normal, you will receive your results only by: [name] Kitchen MyChart Message (if you have MyChart) OR . A paper copy in the mail If you have any lab test that is abnormal or we need to change your treatment, we will call you to review the results.   Follow-Up: At [facility], you and your health needs are our priority.  As part of our continuing mission to provide you with exceptional heart care, we have created designated Provider Care Teams.  These Care Teams include your primary Cardiologist (physician) and Advanced Practice Providers (APPs -  Physician Assistants and Nurse Practitioners) who all work together to provide you with the care you need, when you need it.  Your next appointment:   4 week(s)  The format for your next appointment:   In Person  Provider:   Sanda Klein, MD  Other Instructions  Dear Alan [name],  You are scheduled for a TEE and Cardioversion on 01/15/2019 with Dr. Sallyanne Kuster.  Please arrive at the [facility] (Main Entrance A) at [facility]: [place], [place] [address] at 1 pm. (1 hour prior to procedure)  DIET: Nothing to eat or drink after midnight except a sip of water with medications (see medication instructions below)  Medication Instructions: Hold Glipizide the morning of the procedure  Continue your anticoagulant: Warfarin. Do not miss a dose. If you do please call the office immediatly.  You will need to continue your anticoagulant after your procedure until you  are told by your provider that it is safe to stop   Labs:   You will need to have the coronavirus test completed prior to your procedure. An appointment has been made  at 10:55 on 01/13/2019. This is a Drive Up Visit at the [facility] [address] Someone will direct you to the appropriate testing line. Please tell them that you are there for procedure testing. Stay in your car and someone will be with you shortly. Please make sure to have all other labs completed before this test because you will need to stay quarantined until your procedure.   You must have a responsible person to drive you home and stay in the waiting area during your procedure. Failure to do so could result in cancellation.  Bring your insurance cards.  *Special Note: Every effort is made to have your procedure done on time. Occasionally there are emergencies that occur at the hospital that may cause delays. Please be patient if a delay does occur.

## 2019-01-15 ENCOUNTER — Ambulatory Visit (HOSPITAL_COMMUNITY): Payer: Medicare HMO | Admitting: Certified Registered Nurse Anesthetist

## 2019-01-15 ENCOUNTER — Other Ambulatory Visit: Payer: Self-pay

## 2019-01-15 ENCOUNTER — Encounter (HOSPITAL_COMMUNITY)
Admission: RE | Disposition: A | Discharge status: Home / Self Care | Payer: Medicare HMO | Source: Home / Self Care | Attending: Cardiovascular Disease

## 2019-01-15 ENCOUNTER — Encounter (HOSPITAL_COMMUNITY): Payer: Self-pay | Admitting: *Deleted

## 2019-01-15 ENCOUNTER — Ambulatory Visit (HOSPITAL_COMMUNITY)
Admission: RE | Admit: 2019-01-15 | Discharge: 2019-01-15 | Disposition: A | Discharge status: Home / Self Care | Payer: Medicare HMO | Attending: Cardiovascular Disease | Admitting: Cardiovascular Disease

## 2019-01-15 ENCOUNTER — Ambulatory Visit (HOSPITAL_BASED_OUTPATIENT_CLINIC_OR_DEPARTMENT_OTHER): Payer: Medicare HMO

## 2019-01-15 DIAGNOSIS — M1990 Unspecified osteoarthritis, unspecified site: Secondary | ICD-10-CM | POA: Diagnosis not present

## 2019-01-15 DIAGNOSIS — Z79899 Other long term (current) drug therapy: Secondary | ICD-10-CM | POA: Diagnosis not present

## 2019-01-15 DIAGNOSIS — Z951 Presence of aortocoronary bypass graft: Secondary | ICD-10-CM | POA: Diagnosis not present

## 2019-01-15 DIAGNOSIS — I25718 Atherosclerosis of autologous vein coronary artery bypass graft(s) with other forms of angina pectoris: Secondary | ICD-10-CM

## 2019-01-15 DIAGNOSIS — Z794 Long term (current) use of insulin: Secondary | ICD-10-CM | POA: Insufficient documentation

## 2019-01-15 DIAGNOSIS — I25119 Atherosclerotic heart disease of native coronary artery with unspecified angina pectoris: Secondary | ICD-10-CM | POA: Diagnosis not present

## 2019-01-15 DIAGNOSIS — N183 Chronic kidney disease, stage 3 unspecified: Secondary | ICD-10-CM | POA: Diagnosis not present

## 2019-01-15 DIAGNOSIS — G4733 Obstructive sleep apnea (adult) (pediatric): Secondary | ICD-10-CM | POA: Diagnosis not present

## 2019-01-15 DIAGNOSIS — E669 Obesity, unspecified: Secondary | ICD-10-CM | POA: Insufficient documentation

## 2019-01-15 DIAGNOSIS — I484 Atypical atrial flutter: Secondary | ICD-10-CM | POA: Diagnosis present

## 2019-01-15 DIAGNOSIS — I340 Nonrheumatic mitral (valve) insufficiency: Secondary | ICD-10-CM | POA: Diagnosis not present

## 2019-01-15 DIAGNOSIS — Z950 Presence of cardiac pacemaker: Secondary | ICD-10-CM | POA: Diagnosis not present

## 2019-01-15 DIAGNOSIS — I442 Atrioventricular block, complete: Secondary | ICD-10-CM | POA: Insufficient documentation

## 2019-01-15 DIAGNOSIS — E7800 Pure hypercholesterolemia, unspecified: Secondary | ICD-10-CM | POA: Diagnosis not present

## 2019-01-15 DIAGNOSIS — I4891 Unspecified atrial fibrillation: Secondary | ICD-10-CM

## 2019-01-15 DIAGNOSIS — E1159 Type 2 diabetes mellitus with other circulatory complications: Secondary | ICD-10-CM | POA: Insufficient documentation

## 2019-01-15 DIAGNOSIS — I4819 Other persistent atrial fibrillation: Secondary | ICD-10-CM | POA: Insufficient documentation

## 2019-01-15 DIAGNOSIS — I129 Hypertensive chronic kidney disease with stage 1 through stage 4 chronic kidney disease, or unspecified chronic kidney disease: Secondary | ICD-10-CM | POA: Insufficient documentation

## 2019-01-15 DIAGNOSIS — I495 Sick sinus syndrome: Secondary | ICD-10-CM

## 2019-01-15 DIAGNOSIS — Z7901 Long term (current) use of anticoagulants: Secondary | ICD-10-CM

## 2019-01-15 DIAGNOSIS — I255 Ischemic cardiomyopathy: Secondary | ICD-10-CM | POA: Diagnosis not present

## 2019-01-15 DIAGNOSIS — Z6832 Body mass index (BMI) 32.0-32.9, adult: Secondary | ICD-10-CM | POA: Diagnosis not present

## 2019-01-15 DIAGNOSIS — E785 Hyperlipidemia, unspecified: Secondary | ICD-10-CM | POA: Diagnosis not present

## 2019-01-15 DIAGNOSIS — E1122 Type 2 diabetes mellitus with diabetic chronic kidney disease: Secondary | ICD-10-CM | POA: Insufficient documentation

## 2019-01-15 HISTORY — PX: CARDIOVERSION: SHX1299

## 2019-01-15 HISTORY — PX: TEE WITHOUT CARDIOVERSION: SHX5443

## 2019-01-15 LAB — POCT I-STAT, CHEM 8
BUN: 44 mg/dL — ABNORMAL HIGH (ref 8–23)
Calcium, Ion: 1.27 mmol/L (ref 1.15–1.40)
Chloride: 107 mmol/L (ref 98–111)
Creatinine, Ser: 1.7 mg/dL — ABNORMAL HIGH (ref 0.61–1.24)
Glucose, Bld: 153 mg/dL — ABNORMAL HIGH (ref 70–99)
HCT: 35 % — ABNORMAL LOW (ref 39.0–52.0)
Hemoglobin: 11.9 g/dL — ABNORMAL LOW (ref 13.0–17.0)
Potassium: 4.8 mmol/L (ref 3.5–5.1)
Sodium: 139 mmol/L (ref 135–145)
TCO2: 23 mmol/L (ref 22–32)

## 2019-01-15 SURGERY — ECHOCARDIOGRAM, TRANSESOPHAGEAL
Anesthesia: Monitor Anesthesia Care

## 2019-01-15 MED ORDER — ONDANSETRON HCL 4 MG/2ML IJ SOLN
INTRAMUSCULAR | Status: DC | PRN
  Administered 2019-01-15: 4 mg via INTRAVENOUS

## 2019-01-15 MED ORDER — PROPOFOL 500 MG/50ML IV EMUL
INTRAVENOUS | Status: DC | PRN
  Administered 2019-01-15: 75 ug/kg/min via INTRAVENOUS

## 2019-01-15 MED ORDER — DEXMEDETOMIDINE HCL 200 MCG/2ML IV SOLN
INTRAVENOUS | Status: DC | PRN
  Administered 2019-01-15: 24 ug via INTRAVENOUS

## 2019-01-15 MED ORDER — SODIUM CHLORIDE 0.9 % IV SOLN: INTRAVENOUS | Status: DC

## 2019-01-15 MED ORDER — DEXAMETHASONE SODIUM PHOSPHATE 10 MG/ML IJ SOLN
INTRAMUSCULAR | Status: DC | PRN
  Administered 2019-01-15: 10 mg via INTRAVENOUS

## 2019-01-15 MED ORDER — SUCCINYLCHOLINE CHLORIDE 20 MG/ML IJ SOLN
INTRAMUSCULAR | Status: DC | PRN
  Administered 2019-01-15: 100 mg via INTRAVENOUS

## 2019-01-15 MED ORDER — LIDOCAINE 2% (20 MG/ML) 5 ML SYRINGE
INTRAMUSCULAR | Status: DC | PRN
  Administered 2019-01-15: 40 mg via INTRAVENOUS

## 2019-01-15 NOTE — Anesthesia Preprocedure Evaluation (Signed)
Anesthesia Evaluation  Patient identified by MRN, date of birth, ID band Patient awake    Reviewed: Allergy & Precautions, NPO status , Patient's Chart, lab work & pertinent test results, reviewed documented beta blocker date and time   Airway Mallampati: II       Dental  (+) Caps, Dental Advisory Given   Pulmonary sleep apnea and Continuous Positive Airway Pressure Ventilation ,    Pulmonary exam normal breath sounds clear to auscultation       Cardiovascular hypertension, Pt. on medications and Pt. on home beta blockers + angina + CAD, + Cardiac Stents and + CABG  Normal cardiovascular exam+ dysrhythmias Atrial Fibrillation + pacemaker  Rhythm:Regular Rate:Normal   1. Left ventricular ejection fraction, by visual estimation, is 45 to 50%. The left ventricle has mildly decreased function. There is no left ventricular hypertrophy.  2. Left ventricular diastolic parameters are consistent with Grade II diastolic dysfunction (pseudonormalization).  3. Mildly dilated left ventricular internal cavity size.  4. The left ventricle demonstrates global hypokinesis.  5. Global right ventricle has normal systolic function.The right ventricular size is normal. No increase in right ventricular wall thickness.  6. Left atrial size was mildly dilated.  7. Right atrial size was mildly dilated.  8. The mitral valve is normal in structure. Trace mitral valve regurgitation. No evidence of mitral stenosis.  9. The tricuspid valve is normal in structure. Tricuspid valve regurgitation is mild. 10. The aortic valve is tricuspid. Aortic valve regurgitation is not visualized. No evidence of aortic valve sclerosis or stenosis. 11. The pulmonic valve was normal in structure. Pulmonic valve regurgitation is mild. 12. Mildly elevated pulmonary artery systolic pressure. 13. A pacer wire is visualized. 14. The inferior vena cava is normal in size with <50%  respiratory variability, suggesting right atrial pressure of 8 mmHg.   S/p CABG x2 1990, PCI to [facility] and 2004   Recurrent PAF, SOB a/w episodes   Last cath 12/2018:  Prox LAD lesion is 100% stenosed.  2nd Mrg lesion is 40% stenosed.  Dist Cx lesion is 99% stenosed.  The left ventricular systolic function is normal.  LV end diastolic pressure is mildly elevated.  The left ventricular ejection fraction is 50-55% by visual estimate.  Prox RCA lesion is 100% stenosed.  Mid RCA to Dist RCA lesion is 70% stenosed.  SVG.  The graft exhibits no disease.  LIMA and is normal in caliber.  The graft exhibits no disease.   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    GI/Hepatic negative GI ROS, Neg liver ROS,   Endo/Other  diabetes, Type 2, Oral Hypoglycemic Agents  Renal/GU CRFRenal diseaseCKD 3  negative genitourinary   Musculoskeletal  (+) Arthritis ,   Abdominal (+) + obese,   Peds  Hematology negative hematology ROS (+) anemia ,   Anesthesia Other Findings   Reproductive/Obstetrics negative OB ROS                             Anesthesia Physical  Anesthesia Plan  ASA: III  Anesthesia Plan: General   Post-op Pain Management:  Regional for Post-op pain   Induction: Intravenous  PONV Risk Score and Plan: Ondansetron and Treatment may vary due to age or medical condition  Airway Management Planned: Natural Airway and Mask  Additional Equipment: None  Intra-op Plan:   Post-operative Plan: Extubation in OR  Informed Consent: I have reviewed the patients History and Physical,  chart, labs and discussed the procedure including the risks, benefits and alternatives for the proposed anesthesia with the patient or authorized representative who has indicated his/her understanding and acceptance.     Dental advisory given  Plan Discussed with: CRNA  Anesthesia Plan Comments: (GA/ETT  as backup)        Anesthesia Quick Evaluation                                  Anesthesia Evaluation  Patient identified by MRN, date of birth, ID band Patient awake    Reviewed: Allergy & Precautions, NPO status , Patient's Chart, lab work & pertinent test results, reviewed documented beta blocker date and time   History of Anesthesia Complications Negative for: history of anesthetic complications  Airway Mallampati: II  TM Distance: >3 FB Neck ROM: Full    Dental  (+) Caps, Dental Advisory Given   Pulmonary sleep apnea and Continuous Positive Airway Pressure Ventilation ,    breath sounds clear to auscultation       Cardiovascular hypertension, Pt. on medications and Pt. on home beta blockers (-) angina+ CAD, + Cardiac Stents and + CABG  + dysrhythmias (s/p Maze) Atrial Fibrillation and Ventricular Tachycardia + pacemaker (dual chamber Medtronic for CHB)  Rhythm:Regular Rate:Normal  '17 ECHO: EF 55-60%, valves OK   Neuro/Psych negative neurological ROS  negative psych ROS   GI/Hepatic negative GI ROS, Neg liver ROS,   Endo/Other  diabetes (glu 90), Oral Hypoglycemic AgentsMorbid obesity  Renal/GU Renal InsufficiencyRenal disease (creat 1.54)     Musculoskeletal  (+) Arthritis , Osteoarthritis,    Abdominal (+) + obese,   Peds  Hematology negative hematology ROS (+)   Anesthesia Other Findings   Reproductive/Obstetrics                             Anesthesia Physical Anesthesia Plan  ASA: III  Anesthesia Plan: General   Post-op Pain Management:    Induction: Intravenous  PONV Risk Score and Plan: 3 and Ondansetron and Dexamethasone  Airway Management Planned: Oral ETT  Additional Equipment:   Intra-op Plan:   Post-operative Plan: Extubation in OR  Informed Consent: I have reviewed the patients History and Physical, chart, labs and discussed the procedure including the risks, benefits and alternatives  for the proposed anesthesia with the patient or authorized representative who has indicated his/her understanding and acceptance.   Dental advisory given  Plan Discussed with: CRNA and Surgeon  Anesthesia Plan Comments: (Plan routine monitors, GETA)        Anesthesia Quick Evaluation                                   Anesthesia Evaluation  Patient identified by MRN, date of birth, ID band Patient awake    Reviewed: Allergy & Precautions, NPO status , Patient's Chart, lab work & pertinent test results, reviewed documented beta blocker date and time   History of Anesthesia Complications Negative for: history of anesthetic complications  Airway Mallampati: II  TM Distance: >3 FB Neck ROM: Full    Dental  (+) Caps, Dental Advisory Given   Pulmonary sleep apnea and Continuous Positive Airway Pressure Ventilation ,    breath sounds clear to auscultation       Cardiovascular hypertension, Pt. on medications and Pt.  on home beta blockers (-) angina+ CAD, + Cardiac Stents and + CABG  + dysrhythmias (s/p Maze) Atrial Fibrillation and Ventricular Tachycardia + pacemaker (dual chamber Medtronic for CHB)  Rhythm:Regular Rate:Normal  '17 ECHO: EF 55-60%, valves OK   Neuro/Psych negative neurological ROS  negative psych ROS   GI/Hepatic negative GI ROS, Neg liver ROS,   Endo/Other  diabetes (glu 90), Oral Hypoglycemic AgentsMorbid obesity  Renal/GU Renal InsufficiencyRenal disease (creat 1.54)     Musculoskeletal  (+) Arthritis , Osteoarthritis,    Abdominal (+) + obese,   Peds  Hematology negative hematology ROS (+)   Anesthesia Other Findings   Reproductive/Obstetrics                             Anesthesia Physical Anesthesia Plan  ASA: III  Anesthesia Plan: General   Post-op Pain Management:    Induction: Intravenous  PONV Risk Score and Plan: 3 and Ondansetron and Dexamethasone  Airway Management Planned: Oral  ETT  Additional Equipment:   Intra-op Plan:   Post-operative Plan: Extubation in OR  Informed Consent: I have reviewed the patients History and Physical, chart, labs and discussed the procedure including the risks, benefits and alternatives for the proposed anesthesia with the patient or authorized representative who has indicated his/her understanding and acceptance.   Dental advisory given  Plan Discussed with: CRNA and Surgeon  Anesthesia Plan Comments: (Plan routine monitors, GETA)        Anesthesia Quick Evaluation

## 2019-01-15 NOTE — Anesthesia Postprocedure Evaluation (Signed)
Anesthesia Post Note  Patient: Alan [name]  Procedure(s) Performed: TRANSESOPHAGEAL ECHOCARDIOGRAM (TEE) (N/A ) CARDIOVERSION (N/A )     Patient location during evaluation: PACU Anesthesia Type: General Level of consciousness: awake and alert, oriented and patient cooperative Pain management: pain level controlled Vital Signs Assessment: post-procedure vital signs reviewed and stable Respiratory status: spontaneous breathing, nonlabored ventilation and respiratory function stable Cardiovascular status: blood pressure returned to baseline and stable Postop Assessment: no apparent nausea or vomiting Anesthetic complications: no    Last Vitals:  Vitals:   01/15/19 1454 01/15/19 1502  BP: (!) 137/59 130/62  Pulse: (!) 59 (!) 58  Resp: 17 18  Temp:    SpO2: 96% 96%    Last Pain:  Vitals:   01/15/19 1454  TempSrc:   PainSc: 0-No pain                 Pervis Hocking

## 2019-01-15 NOTE — Transfer of Care (Signed)
Immediate Anesthesia Transfer of Care Note  Patient: Alan [name]  Procedure(s) Performed: TRANSESOPHAGEAL ECHOCARDIOGRAM (TEE) (N/A ) CARDIOVERSION (N/A )  Patient Location: Endoscopy Unit  Anesthesia Type:General  Level of Consciousness: drowsy  Airway & Oxygen Therapy: Patient Spontanous Breathing and Patient connected to nasal cannula oxygen  Post-op Assessment: Report given to RN and Post -op Vital signs reviewed and stable  Post vital signs: Reviewed and stable  Last Vitals:  Vitals Value Taken Time  BP    Temp    Pulse 64 01/15/19 1434  Resp 13 01/15/19 1434  SpO2 97 % 01/15/19 1434  Vitals shown include unvalidated device data.  Last Pain:  Vitals:   01/15/19 1325  TempSrc: Temporal  PainSc: 0-No pain         Complications: No apparent anesthesia complications

## 2019-01-15 NOTE — Progress Notes (Signed)
  Echocardiogram Echocardiogram Transesophageal has been performed.  Johny Chess 01/15/2019, 2:43 PM

## 2019-01-15 NOTE — Interval H&P Note (Signed)
History and Physical Interval Note:  01/15/2019 3:29 PM  Alan [name]  has presented today for surgery, with the diagnosis of A-FIB.  The various methods of treatment have been discussed with the patient and family. After consideration of risks, benefits and other options for treatment, the patient has consented to  Procedure(s): TRANSESOPHAGEAL ECHOCARDIOGRAM (TEE) (N/A) CARDIOVERSION (N/A) as a surgical intervention.  The patient's history has been reviewed, patient examined, no change in status, stable for surgery.  I have reviewed the patient's chart and labs.  Questions were answered to the patient's satisfaction.     Nitzia Perren

## 2019-01-15 NOTE — Discharge Instructions (Signed)
Electrical Cardioversion, Care After This sheet gives you information about how to care for yourself after your procedure. Your health care provider may also give you more specific instructions. If you have problems or questions, contact your health care provider. What can I expect after the procedure? After the procedure, it is common to have:  Some redness on the skin where the shocks were given. Follow these instructions at home:   Do not drive for 24 hours if you were given a medicine to help you relax (sedative).  Take over-the-counter and prescription medicines only as told by your health care provider.  Ask your health care provider how to check your pulse. Check it often.  Rest for 48 hours after the procedure or as told by your health care provider.  Avoid or limit your caffeine use as told by your health care provider. Contact a health care provider if:  You feel like your heart is beating too quickly or your pulse is not regular.  You have a serious muscle cramp that does not go away. Get help right away if:   You have discomfort in your chest.  You are dizzy or you feel faint.  You have trouble breathing or you are short of breath.  Your speech is slurred.  You have trouble moving an arm or leg on one side of your body.  Your fingers or toes turn cold or blue. This information is not intended to replace advice given to you by your health care provider. Make sure you discuss any questions you have with your health care provider. Document Released: 11/13/2012 Document Revised: 01/05/2017 Document Reviewed: 07/30/2015 Elsevier Patient Education  [place].   Transesophageal Echocardiogram Transesophageal echocardiogram (TEE) is a test that uses sound waves to take pictures of your heart. TEE is done by passing a flexible tube down the esophagus. The esophagus is the tube that carries food from the throat to the stomach. The pictures give detailed images of  your heart. This can help your doctor see if there are problems with your heart.  What happens during the procedure?  To lower your risk of infection, your doctors will wash or clean their hands.  An IV will be put into one of your veins.  You will be given a medicine to help you relax (sedative).  A medicine may be sprayed or gargled. This numbs the back of your throat.  Your blood pressure, heart rate, and breathing will be watched.  You may be asked to lay on your left side.  A bite block will be placed in your mouth. This keeps you from biting the tube.  The tip of the TEE probe will be placed into the back of your mouth.  You will be asked to swallow.  Your doctor will take pictures of your heart.  The probe and bite block will be taken out. The procedure may vary among doctors and hospitals. What happens after the procedure?   Your blood pressure, heart rate, breathing rate, and blood oxygen level will be watched until the medicines you were given have worn off.  When you first wake up, your throat may feel sore and numb. This will get better over time. You will not be allowed to eat or drink until the numbness has gone away.  Do not drive for 24 hours if you were given a medicine to help you relax. Summary  TEE is a test that uses sound waves to take pictures of your heart.  You will be given a medicine to help you relax.  Do not drive for 24 hours if you were given a medicine to help you relax. This information is not intended to replace advice given to you by your health care provider. Make sure you discuss any questions you have with your health care provider. Document Released: 11/20/2008 Document Revised: 10/12/2017 Document Reviewed: 04/26/2016 Elsevier Patient Education  2020 [employer].

## 2019-01-15 NOTE — Op Note (Signed)
INDICATIONS: persistent atypical atrial flutter/atrial fibrillation  PROCEDURE:   Informed consent was obtained prior to the procedure. The risks, benefits and alternatives for the procedure were discussed and the patient comprehended these risks.  Risks include, but are not limited to, cough, sore throat, vomiting, nausea, somnolence, esophageal and stomach trauma or perforation, bleeding, low blood pressure, aspiration, pneumonia, infection, trauma to the teeth and death.    After a procedural time-out, the oropharynx was anesthetized with 20% benzocaine spray.   During this procedure the patient underwent general anesthesia and endotracheal intubation, Dr. Doroteo Glassman.  Despite multiple attempts by 2 operators, the TEE probe could not be passed with deep sedation only. After endotracheal intubation, the transesophageal probe was inserted in the esophagus and stomach without difficulty, and multiple views were obtained.  The patient was kept under observation until the patient left the procedure room.  The patient left the procedure room in stable condition.   Agitated microbubble saline contrast was not administered.  COMPLICATIONS:    There were no immediate complications.  FINDINGS:  No left atrial clot seen. There was mild spontaneous echo contrast. The appendage was clipped, but a small segment of the appendage still appeared permeable, free of clot. No significant valvular abnormalities. Normal LV systolic function.  RECOMMENDATIONS:     Proceed with cardioversion.  Time Spent Directly with the Patient:  30 minutes   Alan [name] 01/15/2019, 2:23 PM

## 2019-01-15 NOTE — Op Note (Signed)
Procedure: Electrical Cardioversion Indications:  Atrial Fibrillation and Atrial Flutter  Procedure Details:  Consent: Risks of procedure as well as the alternatives and risks of each were explained to the (patient/caregiver).  Consent for procedure obtained.  Time Out: Verified patient identification, verified procedure, site/side was marked, verified correct patient position, special equipment/implants available, medications/allergies/relevent history reviewed, required imaging and test results available.  Performed  Performed immediately after TEE.  The atrial electrogram showed atypical atrial flutter, CL 350 ms. Overdrive atrial pacing at multiple cycle lengths (starting at 300 ms, decrement stepwise to 200 ms) failed to entrain the atrium or terminate the arrhythmia.  Eventually deteriorated to atrial fibrillation.  Patient placed on cardiac monitor, pulse oximetry, supplemental oxygen as necessary.  Sedation given: GETA, IV propofol, Dr. Doroteo Glassman Pacer pads placed anterior and posterior chest.  Cardioverted 1 time(s).  Cardioversion with synchronized biphasic 120J shock.  Evaluation: Findings: Post procedure EKG shows: atrial paced, ventricular sensed rhythm Complications: None Patient did tolerate procedure well.  Comprehensive pacemaker check after DCCV showed normal device function.  Time Spent Directly with the Patient:  30 minutes   Benno Brensinger 01/15/2019, 2:27 PM

## 2019-01-15 NOTE — Anesthesia Procedure Notes (Signed)
Procedure Name: Intubation Date/Time: 01/15/2019 2:04 PM Performed by: Valda Favia, CRNA Pre-anesthesia Checklist: Patient identified, Emergency Drugs available, Suction available and Patient being monitored Patient Re-evaluated:Patient Re-evaluated prior to induction Oxygen Delivery Method: Circle System Utilized Preoxygenation: Pre-oxygenation with 100% oxygen Induction Type: IV induction and Rapid sequence Laryngoscope Size: Mac and 4 Grade View: Grade I Tube type: Oral Tube size: 7.5 mm Number of attempts: 1 Airway Equipment and Method: Stylet and Oral airway Placement Confirmation: ETT inserted through vocal cords under direct vision,  positive ETCO2 and breath sounds checked- equal and bilateral Secured at: 22 cm Tube secured with: Tape Dental Injury: Teeth and Oropharynx as per pre-operative assessment

## 2019-01-16 NOTE — Progress Notes (Signed)
Attempted, left voicemail

## 2019-01-20 ENCOUNTER — Ambulatory Visit (INDEPENDENT_AMBULATORY_CARE_PROVIDER_SITE_OTHER): Payer: Medicare HMO | Admitting: Pharmacist

## 2019-01-20 ENCOUNTER — Other Ambulatory Visit: Payer: Self-pay

## 2019-01-20 DIAGNOSIS — I484 Atypical atrial flutter: Secondary | ICD-10-CM | POA: Diagnosis not present

## 2019-01-20 DIAGNOSIS — Z7901 Long term (current) use of anticoagulants: Secondary | ICD-10-CM | POA: Diagnosis not present

## 2019-01-20 LAB — POCT INR: INR: 3.3 — AB (ref 2.0–3.0)

## 2019-02-01 NOTE — Progress Notes (Signed)
PPM remote 

## 2019-02-05 ENCOUNTER — Ambulatory Visit (INDEPENDENT_AMBULATORY_CARE_PROVIDER_SITE_OTHER): Payer: Medicare HMO | Admitting: Pharmacist

## 2019-02-05 ENCOUNTER — Other Ambulatory Visit: Payer: Self-pay

## 2019-02-05 DIAGNOSIS — Z7901 Long term (current) use of anticoagulants: Secondary | ICD-10-CM | POA: Diagnosis not present

## 2019-02-05 DIAGNOSIS — I484 Atypical atrial flutter: Secondary | ICD-10-CM

## 2019-02-05 LAB — POCT INR: INR: 2.9 (ref 2.0–3.0)

## 2019-02-12 ENCOUNTER — Encounter: Payer: Self-pay | Admitting: *Deleted

## 2019-02-25 ENCOUNTER — Telehealth: Payer: Self-pay | Admitting: *Deleted

## 2019-02-25 NOTE — Telephone Encounter (Signed)
From [facility] I have problem with breathing. Need to see you.  Not functioning very well. Please call.     [phone]   Received message from patient via MyChart     Attempt to call patient, no answer, lmtcb.

## 2019-02-26 NOTE — Telephone Encounter (Signed)
LM2CB 

## 2019-02-26 NOTE — Telephone Encounter (Signed)
Please have him do a pacemaker download when he gets home tonight. May have had recurrent atrial fibrillation.

## 2019-02-26 NOTE — Telephone Encounter (Signed)
Left detailed message for pt to download today.

## 2019-02-26 NOTE — Telephone Encounter (Signed)
Pt called back, he had some SOB yesterday when running errands. He denies any swelling or any other symptoms, (he is taking all medications as ordered). SOB lasted for 5 minutes then "it went away" and has not happened since.  He states that he just wanted to let Dr C know that this happened for "no reason" and hasn't happened since.

## 2019-02-27 NOTE — Telephone Encounter (Signed)
Left a message for the patient to call back.  

## 2019-02-28 ENCOUNTER — Encounter: Payer: Self-pay | Admitting: Pharmacist Clinician (PhC)/ Clinical Pharmacy Specialist

## 2019-02-28 ENCOUNTER — Encounter: Payer: Self-pay | Admitting: *Deleted

## 2019-02-28 ENCOUNTER — Telehealth: Payer: Self-pay

## 2019-02-28 ENCOUNTER — Other Ambulatory Visit: Payer: Self-pay

## 2019-02-28 ENCOUNTER — Ambulatory Visit (INDEPENDENT_AMBULATORY_CARE_PROVIDER_SITE_OTHER): Payer: Medicare HMO | Admitting: Pharmacist Clinician (PhC)/ Clinical Pharmacy Specialist

## 2019-02-28 DIAGNOSIS — Z7901 Long term (current) use of anticoagulants: Secondary | ICD-10-CM | POA: Diagnosis not present

## 2019-02-28 DIAGNOSIS — I484 Atypical atrial flutter: Secondary | ICD-10-CM

## 2019-02-28 LAB — POCT INR: INR: 2.9 (ref 2.0–3.0)

## 2019-02-28 NOTE — Telephone Encounter (Signed)
LMOVM for the pt to send a manual transmission with his home remote monitor per Dr. Sallyanne Kuster request. I also left my direct office number for the pt to call me back.

## 2019-02-28 NOTE — Telephone Encounter (Signed)
Left message to call back  

## 2019-02-28 NOTE — Telephone Encounter (Signed)
Message sent to patient via MyChart. 

## 2019-03-03 NOTE — Telephone Encounter (Signed)
Transmission received 03-03-2019

## 2019-03-03 NOTE — Telephone Encounter (Signed)
Download received. See the other encounter.

## 2019-03-03 NOTE — Telephone Encounter (Signed)
I asked the pt to send a manual transmission and he agreed to send one in 10 minutes.

## 2019-03-03 NOTE — Telephone Encounter (Signed)
Transmission reviewed. Normal PPM function. No episodes. Lead trends stable. Presenting rhythm AP/VS at 82bpm. Routed to Dr. Sallyanne Kuster and Lattie Haw, RN.

## 2019-03-04 ENCOUNTER — Encounter: Payer: Self-pay | Admitting: *Deleted

## 2019-03-04 ENCOUNTER — Telehealth: Payer: Self-pay | Admitting: *Deleted

## 2019-03-04 NOTE — Telephone Encounter (Signed)
Thank you! Alan [name], can you please tell him that he has not had any atrial fibrillation recently?

## 2019-03-04 NOTE — Telephone Encounter (Signed)
Message sent through My Chart. 

## 2019-03-04 NOTE — Telephone Encounter (Signed)
    [facility] Pre-operative Risk Assessment    Request for surgical clearance:  1. What type of surgery is being performed? L5-S1 posterior lateral fusion w/ removal of hardware   2. When is this surgery scheduled? TBD   3. What type of clearance is required (medical clearance vs. Pharmacy clearance to hold med vs. Both)? both  4. Are there any medications that need to be held prior to surgery and how long? coumadin   5. Practice name and name of physician performing surgery? [facility] Dr. Ronnald Ramp   6. What is your office phone number [phone]    7.   What is your office fax number [phone] attn: Lorriane Shire  8.   Anesthesia type (None, local, MAC, general) ? general   Alan [name] Alan [name] 03/04/2019, 4:29 PM  _________________________________________________________________   (provider comments below)

## 2019-03-05 ENCOUNTER — Other Ambulatory Visit: Payer: Self-pay | Admitting: Neurological Surgery

## 2019-03-05 NOTE — Telephone Encounter (Signed)
Left voice mail to call back 

## 2019-03-05 NOTE — Telephone Encounter (Signed)
Pt takes warfarin for afib with CHADS2VASc score of 5 (age x2, HTN, CAD, DM), underwent cardioversion on 01/15/19.   Ok to hold warfarin for 5 days prior to spinal procedure.

## 2019-03-06 ENCOUNTER — Ambulatory Visit: Payer: Medicare HMO

## 2019-03-07 NOTE — Telephone Encounter (Addendum)
**Note Alan-Identified via Obfuscation**    Primary Cardiologist: Sanda Klein, MD  Chart reviewed as part of pre-operative protocol coverage. Patient last seen by Dr. Sallyanne Kuster on 01/13/2019. He underwent TEE/DCCV on 01/15/2019 following this visit for persistent atrial flutter/fibrillation. I called and spoke with patient today and he states he is doing relatively well from a cardiac standpoint since this procedure. He notes occasional mild chest pain if he gets very "winded" with activity but states he has not noticed this since his cardiac catheterization in 12/2018 which showed patent grafts (continued medical therapy recommended at that time). No significant shortness of breath, palpitations, lightheadedness, dizziness, near syncope/syncope, orthopnea, PND, edema. Able to complete >4.0 METS. Given past medical history and time since last visit, based on ACC/AHA guidelines, [name] would be at acceptable risk for the planned procedure without further cardiovascular testing.   Per Pharmacy: "Ok to hold warfarin for 5 days prior to spinal procedure."  Given history of atrial flutter/fibrillation with recent cardioversion, recommend monitoring on telemetry throughout peri-operative period.   I will route this recommendation to the requesting party via Epic fax function and remove from pre-op pool.  Please call with questions.  Alan Mclean, PA-C 03/07/2019, 9:48 AM

## 2019-03-09 ENCOUNTER — Other Ambulatory Visit: Payer: Self-pay | Admitting: Cardiovascular Disease

## 2019-03-10 ENCOUNTER — Other Ambulatory Visit: Payer: Self-pay

## 2019-03-10 MED ORDER — WARFARIN SODIUM 5 MG PO TABS: 5.0000 mg | ORAL_TABLET | Freq: Every day | ORAL | 0 refills | Status: DC

## 2019-03-12 ENCOUNTER — Other Ambulatory Visit: Payer: Self-pay

## 2019-03-12 ENCOUNTER — Other Ambulatory Visit: Payer: Self-pay | Admitting: Cardiovascular Disease

## 2019-03-12 ENCOUNTER — Ambulatory Visit: Payer: Medicare HMO | Admitting: Internal Medicine

## 2019-03-12 ENCOUNTER — Encounter: Payer: Self-pay | Admitting: Internal Medicine

## 2019-03-12 VITALS — BP 126/72 | HR 80 | Temp 97.8°F | Ht 70.0 in | Wt 219.4 lb

## 2019-03-12 DIAGNOSIS — G4733 Obstructive sleep apnea (adult) (pediatric): Secondary | ICD-10-CM

## 2019-03-12 DIAGNOSIS — J984 Other disorders of lung: Secondary | ICD-10-CM

## 2019-03-12 DIAGNOSIS — I25708 Atherosclerosis of coronary artery bypass graft(s), unspecified, with other forms of angina pectoris: Secondary | ICD-10-CM | POA: Diagnosis not present

## 2019-03-12 DIAGNOSIS — Z01818 Encounter for other preprocedural examination: Secondary | ICD-10-CM

## 2019-03-12 NOTE — Assessment & Plan Note (Signed)
Patient is not at increased risk for surgical complication due to pulmonary problems based on this examination.

## 2019-03-12 NOTE — Assessment & Plan Note (Signed)
Minor restriction likely related to abdominal obesity and loss of spine flexibility. Not affecting his quality of life.

## 2019-03-12 NOTE — Patient Instructions (Signed)
Handicapped  Parking form completed  Order- DME [employer] please service or repair old machine- problem with switch or power cord. Change to auto 5-15. Continue mask of choice, humidifier, supplies, AirView/ card  Ok to go ahead with planned surgery from a pulmonary standpoint.

## 2019-03-12 NOTE — Assessment & Plan Note (Signed)
Benefits from CPAP. Machine needs service or replacement for what sounds like bad electrical contact or switch. Plan- service or replace with switch to auto 5-15 if machine is replaced.

## 2019-03-12 NOTE — Assessment & Plan Note (Signed)
Cardiology continues to follow with no acute problems reported.

## 2019-03-12 NOTE — Progress Notes (Signed)
HPI M never smoker followed for OSA, history UPPP. Complicated by Post polio, AFib/ pacemaker, CAD/CABG,  DM 2, HBP, gout,  DDD,      PCP Dr Melford Aase NPSG 05/30/97- AHI 20/ hr, desaturation to 78%, body weight 230 lbs  ------------------------------------------------------------------------------   02/26/2018- 77 yoM never smoker followed for OSA, history UPPP. Complicated by Post polio, AFib/ pacemaker, CAD/CABG,  DM 2, HBP, gout, PCP Dr Melford Aase NPSG 05/30/97- ASHI 20/ hr, desaturation to 78%, body weight 230 lbs CPAP 12/ [facility] -----OSA and cough; Last seen 01/2014; here for surgery clearance for lumbar fusion on 03/11/18. He denies any breathing problems, dyspnea or cough at this time.  No acute problems other than back pain.  He reports pacemaker has been working well. CXR is pending. He continues very compliant with CPAP, used all night every night. CXR-05/09/2016- IMPRESSION: No edema or consolidation. Status post pacemaker placement and coronary artery bypass grafting. Aortic atherosclerosis. Office Spirometry-02/26/2018- Mild Restriction of exhaled volume.  FVC 2.9/69%, FEV1 2.4/81%, ratio 1.84, FEF 25-75% 3.1/145%.  03/12/19- 78 yoM never smoker followed for OSA, history UPPP. Complicated by Post polio, AFib/ pacemaker, CAD/CABG,  DM 2, HBP, gout, PCP Dr Melford Aase NPSG 05/30/97- AHI 20/ hr, desaturation to 78%, body weight 230 lbs CPAP 12/ [employer] -----Surgical clearance PREOP for spine surgery+ Form Also asks HC parking- form completed Body weight today 219 l bs Using CPAP every night. Concerned that either power cord or on-off switch wearing out- needs service. Breathing is comfortable. Denies cough or wheeze. No acute medical concern.  ROS-HPI  + = positive Constitutional:   No-   weight loss, night sweats, fevers, chills, fatigue, lassitude. HEENT:   No-  headaches, difficulty swallowing, tooth/dental problems, sore throat,       No-  sneezing, itching, ear ache, nasal congestion, post  nasal drip,  CV:  No-   chest pain, orthopnea, PND, swelling in lower extremities, anasarca, dizziness, palpitations Resp: No-   shortness of breath with exertion or at rest,  cough            No-   No- coughing up of blood.              No-   change in color of mucus.  No- wheezing.   Skin: No-   rash or lesions. GI:  No-   heartburn, indigestion, abdominal pain, nausea, vomiting,  GU:  MS:  No-   joint pain or swelling.  + back pain Neuro-     nothing unusual Psych:  No- change in mood or affect. No depression or anxiety.  No memory loss.  OBJ General- Alert, Oriented, Affect-appropriate, Distress- none acute, + obese Skin- rash-none, lesions- none, excoriation- none Lymphadenopathy- none Head- atraumatic            Eyes- Gross vision intact, PERRLA, conjunctivae clear secretions            Ears- Hearing, canals-normal            Nose- Clear, no-Septal dev, mucus, polyps, erosion, perforation             Throat- Mallampati III/ +sp UPPP , mucosa clear , drainage- none, tonsils- atrophic Neck- flexible , trachea midline, no stridor , thyroid nl, carotid no bruit Chest - symmetrical excursion , unlabored           Heart/CV- RRR , no murmur , no gallop  , no rub, nl s1 s2                           -  JVD- none , edema- none, stasis changes- none, varices- none           Lung- +clear/ unlabored, wheeze- none, cough- none , dullness-none, rub- none           Chest wall-  + pacemaker L Abd- Br/ Gen/ Rectal- Not done, not indicated Extrem-  Neuro- grossly intact to observation

## 2019-03-13 ENCOUNTER — Encounter (HOSPITAL_COMMUNITY): Payer: Self-pay | Admitting: Vascular Surgery

## 2019-03-13 ENCOUNTER — Other Ambulatory Visit (HOSPITAL_COMMUNITY)
Admission: RE | Admit: 2019-03-13 | Discharge: 2019-03-13 | Disposition: A | Discharge status: Home / Self Care | Payer: Medicare HMO | Source: Ambulatory Visit | Attending: Neurological Surgery | Admitting: Neurological Surgery

## 2019-03-13 ENCOUNTER — Other Ambulatory Visit: Payer: Self-pay

## 2019-03-13 ENCOUNTER — Encounter (HOSPITAL_COMMUNITY): Payer: Self-pay

## 2019-03-13 ENCOUNTER — Encounter (HOSPITAL_COMMUNITY)
Admission: RE | Admit: 2019-03-13 | Discharge: 2019-03-13 | Disposition: A | Discharge status: Home / Self Care | Payer: Medicare HMO | Source: Ambulatory Visit | Attending: Neurological Surgery | Admitting: Neurological Surgery

## 2019-03-13 ENCOUNTER — Ambulatory Visit (HOSPITAL_COMMUNITY)
Admission: RE | Admit: 2019-03-13 | Discharge: 2019-03-13 | Disposition: A | Discharge status: Home / Self Care | Payer: Medicare HMO | Source: Ambulatory Visit | Attending: Neurological Surgery | Admitting: Neurological Surgery

## 2019-03-13 DIAGNOSIS — Z01818 Encounter for other preprocedural examination: Secondary | ICD-10-CM | POA: Insufficient documentation

## 2019-03-13 DIAGNOSIS — Z01812 Encounter for preprocedural laboratory examination: Secondary | ICD-10-CM | POA: Insufficient documentation

## 2019-03-13 DIAGNOSIS — X58XXXD Exposure to other specified factors, subsequent encounter: Secondary | ICD-10-CM | POA: Insufficient documentation

## 2019-03-13 DIAGNOSIS — S32009K Unspecified fracture of unspecified lumbar vertebra, subsequent encounter for fracture with nonunion: Secondary | ICD-10-CM | POA: Diagnosis present

## 2019-03-13 DIAGNOSIS — Z20822 Contact with and (suspected) exposure to covid-19: Secondary | ICD-10-CM | POA: Insufficient documentation

## 2019-03-13 DIAGNOSIS — E119 Type 2 diabetes mellitus without complications: Secondary | ICD-10-CM | POA: Insufficient documentation

## 2019-03-13 HISTORY — DX: Unspecified fracture of unspecified lumbar vertebra, subsequent encounter for fracture with nonunion: S32.009K

## 2019-03-13 HISTORY — DX: Failed or difficult intubation, initial encounter: T88.4XXA

## 2019-03-13 LAB — SURGICAL PCR SCREEN
MRSA, PCR: NEGATIVE
Staphylococcus aureus: NEGATIVE

## 2019-03-13 LAB — CBC WITH DIFFERENTIAL/PLATELET
Abs Immature Granulocytes: 0.01 10*3/uL (ref 0.00–0.07)
Basophils Absolute: 0.1 10*3/uL (ref 0.0–0.1)
Basophils Relative: 1 %
Eosinophils Absolute: 0.2 10*3/uL (ref 0.0–0.5)
Eosinophils Relative: 3 %
HCT: 31.9 % — ABNORMAL LOW (ref 39.0–52.0)
Hemoglobin: 10 g/dL — ABNORMAL LOW (ref 13.0–17.0)
Immature Granulocytes: 0 %
Lymphocytes Relative: 21 %
Lymphs Abs: 1.2 10*3/uL (ref 0.7–4.0)
MCH: 30.4 pg (ref 26.0–34.0)
MCHC: 31.3 g/dL (ref 30.0–36.0)
MCV: 97 fL (ref 80.0–100.0)
Monocytes Absolute: 0.6 10*3/uL (ref 0.1–1.0)
Monocytes Relative: 10 %
Neutro Abs: 3.7 10*3/uL (ref 1.7–7.7)
Neutrophils Relative %: 65 %
Platelets: 170 10*3/uL (ref 150–400)
RBC: 3.29 MIL/uL — ABNORMAL LOW (ref 4.22–5.81)
RDW: 13.1 % (ref 11.5–15.5)
WBC: 5.7 10*3/uL (ref 4.0–10.5)
nRBC: 0 % (ref 0.0–0.2)

## 2019-03-13 LAB — TYPE AND SCREEN
ABO/RH(D): A POS
Antibody Screen: NEGATIVE

## 2019-03-13 LAB — BASIC METABOLIC PANEL
Anion gap: 10 (ref 5–15)
BUN: 51 mg/dL — ABNORMAL HIGH (ref 8–23)
CO2: 25 mmol/L (ref 22–32)
Calcium: 9.7 mg/dL (ref 8.9–10.3)
Chloride: 103 mmol/L (ref 98–111)
Creatinine, Ser: 2.23 mg/dL — ABNORMAL HIGH (ref 0.61–1.24)
GFR calc Af Amer: 32 mL/min — ABNORMAL LOW (ref >60)
GFR calc non Af Amer: 27 mL/min — ABNORMAL LOW (ref >60)
Glucose, Bld: 146 mg/dL — ABNORMAL HIGH (ref 70–99)
Potassium: 5.3 mmol/L — ABNORMAL HIGH (ref 3.5–5.1)
Sodium: 138 mmol/L (ref 135–145)

## 2019-03-13 LAB — SARS CORONAVIRUS 2 (TAT 6-24 HRS): SARS Coronavirus 2: NEGATIVE

## 2019-03-13 LAB — HEMOGLOBIN A1C
Hgb A1c MFr Bld: 6.2 % — ABNORMAL HIGH (ref 4.8–5.6)
Mean Plasma Glucose: 131.24 mg/dL

## 2019-03-13 LAB — GLUCOSE, CAPILLARY: Glucose-Capillary: 148 mg/dL — ABNORMAL HIGH (ref 70–99)

## 2019-03-13 NOTE — Progress Notes (Signed)
Pt denies SOB and chest pain. Pt stated that he is under the care of Dr. Sallyanne Kuster, Cardiology and Dr. Melford Aase, PCP. Pt stated that a stress test was performed > 10 years ago. Pt denies having an A1c in the last 2 months. Pt denies having a chest x ray in the last year. Pt stated that last dose of Coumadin was " Saturday or Sunday " as instructed by " Dr. Ronnald Ramp "  ( last dose will be greater than 5 days ). Pt reminded to quarantine. Pt verbalized understanding of al pre-op instructions. Pt chart forwarded to PA, Anesthesiology, for review.

## 2019-03-13 NOTE — Progress Notes (Signed)
Anesthesia Chart Review:  Case: [id number] Date/Time: 03/17/19 1041   Procedure: Posterior lateral fusion - L5-S1, removal of segmental fixation L2-S1 (N/A Back)   Anesthesia type: General   Pre-op diagnosis: Pseudoarthrosis   Location: [facility] OR ROOM 3 / [place] OR   Surgeons: [name], MD      DISCUSSION: Patient is a 79 year old male scheduled for the above procedure.  History includes never smoker, DIFFICULT INTUBATION (no difficulty noted on last 3 intubation in 2020), CAD(s/p CABG 1990[LIMA-LAD-DIAG]; multiple RCA PCI 1990-2004; redoCABG [SVG-RCA-PDA] with MAZE 2004, Dr. Amador Cunas), ischemic cardiomyopathy, complete heart block(s/p Medtronic PPM 2/8/08with generator change 03/07/12), afib (DCCV 06/23/10;warfarind/c'd 2015 d/t GIB; recurrent afib 06/2018 and started on Eliquis, s/p conversion with overdrive pacing 5/99/35; recurrent 10/2018, changed to warfarin due to [facility] 12/2018, s/p DCCV 01/15/19), GI bleed (s/p cauterization gastric lesion ~ 05/2013; GI bleed 02/04/18 and 02/15/18 even while off anticoagulants), DM2, HTN, CKD (stage III), OSA(s/p UPPP, CPAP), post-polio syndrome(effectingLLE),back surgeries (L3-4 laminectomy 01/10/12;L3-4 PLIF 07/2012; removal L3-4 posterior instrumentation and decompressive hemilaminectomy/foraminotomy L2-3/L4-5 05/18/16; L2-3 PLIF 02/22/17; L4-5 PLIF, posterior fixation L2-S1 03/11/18), left shoulder arthroscopy/rotator cuff repair (10/01/18).   - Preoperative pulmonology clearance per Dr. Annamaria Boots on 03/12/19, "Patient is not at increased risk for surgical complication due to pulmonary problems based on this examination."  - Preoperative cardiology clearance as outlined by Sande Rives, PA-C includes, "...Given past medical history and time since last visit, based on ACC/AHA guidelines, [place] [facility] would be at acceptable risk for the planned procedure without further cardiovascular testing.  Per Pharmacy: '[place] to hold warfarin for 5 days prior to spinal  procedure.' Given history of atrial flutter/fibrillation with recent cardioversion, recommend monitoring on telemetry throughout peri-operative period." As of 02/25/19 PPM transmission, no afib following cardioversion.  Recent labs trends show:    03/13/2019 03/10/2019 ([facility]) 01/15/19 ([facility]) 01/13/19  HGB 10.0 9.8 11.9 11.6  HCT 31.9 29.1 35.0 34.1  BUN 51 45 44 58  Creatinine 2.23 2.00 1.70 1.78  Potassium 5.3 5.7 4.9 5.4  Creatinine has primarily been ~ 1.4-1.70 for the past year, but up to the 2 range in 03/2019  03/13/19 presurgical COVID-19 test negative. Warfarin currently on hold. He will need a PT/INR on the day of surgery.   On 03/14/19, I contacted Mr Consuegra and discussed his labs. He feels in his usual state of health. No chest pain, SOB, edema. He does not take Lasix everyday--may depend on his UOP. I advised that he would need close follow-up of his renal function and that I would contact Dr. Fayrene Fearing office to review his labs.  Unfortunately, Dr. Melford Aase was out of the office, but I was able to speak with Vicenta Aly, NP. She reported that Dr. Melford Aase had reviewed 03/10/19 labs done at their office (showing Creatinine 2.0 and HGB 9.8) and was primarily more concerned with his drop in HGB given history of GI bleed. He asked patient to resume iron and let his surgeon know--if surgery is elective then he felt "likely" surgery should be postponed until labs rechecked and was advised to discuss further with surgeon.  I notified Lorriane Shire at Dr. Ronnald Ramp' office, and Dr. Fayrene Fearing communication with patient about labs was faxed for Dr. Ronnald Ramp which he reviewed. Since HGB had slightly increased from 03/10/19 and no dramatic increase in Creatinine since 03/10/19 labs, Dr. Ronnald Ramp recommended patient coming in for surgery as planned but rechecking STAT labs--then determination to proceed with surgery to be made at that time based  on results.  I have updated anesthesiologist Lillia Abed, MD. Should Dr. Melford Aase need  to be contacted on the day of surgery, his office number is [phone].  Lorriane Shire reported that she did update Mr. Solivan about plan to repeat labs on arrival for surgery and then go from there. STAT PT/INR, CBC, and BMET orders are entered.    VS: BP 127/62   Pulse 63   Temp 36.8 C (Oral)   Resp 20   Ht 5\' 10"  (1.778 m)   Wt 100.5 kg   SpO2 100%   BMI 31.78 kg/m    PROVIDERS: [name], MD is PCP ([place], see Care Everywhere). - [name], Mihai, MD is primary cardiologist - Baird Lyons, MD is pulmonologist. Last evaluation 03/12/19 for preoperative evaluation and OSA follow-up.  Lilyan Gilford, PA-C is with [facility]   LABS: Preoperative labs noted. See DISCUSSION. (all labs ordered are listed, but only abnormal results are displayed)  Labs Reviewed  GLUCOSE, CAPILLARY - Abnormal; Notable for the following components:      Result Value   Glucose-Capillary 148 (*)    All other components within normal limits  HEMOGLOBIN A1C - Abnormal; Notable for the following components:   Hgb A1c MFr Bld 6.2 (*)    All other components within normal limits  BASIC METABOLIC PANEL - Abnormal; Notable for the following components:   Potassium 5.3 (*)    Glucose, Bld 146 (*)    BUN 51 (*)    Creatinine, Ser 2.23 (*)    GFR calc non Af Amer 27 (*)    GFR calc Af Amer 32 (*)    All other components within normal limits  CBC WITH DIFFERENTIAL/PLATELET - Abnormal; Notable for the following components:   RBC 3.29 (*)    Hemoglobin 10.0 (*)    HCT 31.9 (*)    All other components within normal limits  SURGICAL PCR SCREEN  TYPE AND SCREEN    Spirometry 02/26/18: MildRestriction of exhaled volume. FVC 2.9/69%, FEV1 2.4/81%, ratio 1.84, FEF 25-75% 3.1/145%.   IMAGES: CXR 03/13/19: FINDINGS: Frontal and lateral views of the chest demonstrates stable enlargement of the cardiac silhouette. Multi lead pacemaker again noted and  unchanged. No airspace disease, effusion, or pneumothorax. Lumbar fusion hardware is identified. Multilevel thoracic spondylosis. IMPRESSION: 1. Stable chest, no acute process.   EKG: 01/13/19: Ventricular paced rhythm   CV: TEE (prior to DCCV) 01/15/19: IMPRESSIONS   1. Left ventricular ejection fraction, by visual estimation, is 55 to  60%. The left ventricle has normal function. Normal left ventricular size.  There is no left ventricular hypertrophy.  2. Abnormal septal motion consistent with RV pacemaker.  3. The left ventricle has no regional wall motion abnormalities.  4. Global right ventricle has normal systolic function.The right  ventricular size is normal. No increase in right ventricular wall  thickness.  5. Left atrial size was moderately dilated.  6. The left atrial appendage is surgically clipped, but there appears to  be a permeable lobe, in the immediate vicinity of the left upper pulmonary  vein. No thrombus is seen in the left atrium.  7. Right atrial size was mildly dilated.  8. The mitral valve is normal in structure. Mild mitral valve  regurgitation.  9. The tricuspid valve is normal in structure. Tricuspid valve  regurgitation is trivial.  10. The aortic valve is normal in structure. Aortic valve regurgitation is  not visualized.  11. The pulmonic valve was  normal in structure. Pulmonic valve  regurgitation is not visualized.  12. A pacer wire is visualized.  13. Pacemaker leads present in the right atrium. There is a tiny  filamentous mobile mass (2x7 mm) on one of the leads, possibly a thrombus,  chronicity unknown.    Echo (TTE) 12/30/18: IMPRESSIONS  1. Left ventricular ejection fraction, by visual estimation, is 45 to  50%. The left ventricle has mildly decreased function. There is no left  ventricular hypertrophy.  2. Left ventricular diastolic parameters are consistent with Grade II  diastolic dysfunction (pseudonormalization).   3. Mildly dilated left ventricular internal cavity size.  4. The left ventricle demonstrates global hypokinesis.  5. Global right ventricle has normal systolic function.The right  ventricular size is normal. No increase in right ventricular wall  thickness.  6. Left atrial size was mildly dilated.  7. Right atrial size was mildly dilated.  8. The mitral valve is normal in structure. Trace mitral valve  regurgitation. No evidence of mitral stenosis.  9. The tricuspid valve is normal in structure. Tricuspid valve  regurgitation is mild.  10. The aortic valve is tricuspid. Aortic valve regurgitation is not  visualized. No evidence of aortic valve sclerosis or stenosis.  11. The pulmonic valve was normal in structure. Pulmonic valve  regurgitation is mild.  12. Mildly elevated pulmonary artery systolic pressure.  13. A pacer wire is visualized.  14. The inferior vena cava is normal in size with <50% respiratory  variability, suggesting right atrial pressure of 8 mmHg.    Cardiac cath 12/18/18:  Prox LAD lesion is 100% stenosed.  2nd Mrg lesion is 40% stenosed.  Dist Cx lesion is 99% stenosed.  The left ventricular systolic function is normal.  LV end diastolic pressure is mildly elevated.  The left ventricular ejection fraction is 50-55% by visual estimate.  Prox RCA lesion is 100% stenosed.  Mid RCA to Dist RCA lesion is 70% stenosed.  SVG.  The graft exhibits no disease.  LIMA and is normal in caliber.  The graft exhibits no disease. 1.  Significant underlying three-vessel coronary artery disease with patent grafts including LIMA to LAD/diagonal and SVG to right PDA.  There is significant diffuse disease in the distal left circumflex (AV groove) supplying a small area. 2.  Normal LV systolic function with mildly elevated left ventricular end-diastolic pressure at 16 mmHg. Recommendations: I recommend continuing medical therapy.  The AV groove left circumflex  is too small and diseased in a long segment.  It supplies a small area and I doubt that this is giving him much symptoms. The patient likely has diastolic dysfunction given elevated LVEDP.   Carotid U/S 01/19/16:  Impression: This study is negative for hemodynamically significant stenosis involving extracranial carotid and vertebral arteries bilaterally.   Past Medical History:  Diagnosis Date  . Anticoagulant long-term use    eliquis  . Arthritis   . Arthropathy of left shoulder   . Biceps tendonitis, left   . Bursitis of left shoulder   . Cardiac pacemaker in situ    first insertion 2008;  genertor and new lead change 03-08-2012;   Medtronic  . CHB (complete heart block) ([place])    s/p  PPM 2008  . Chronic back pain   . CKD (chronic kidney disease), stage III   . Coronary artery disease cardiologist--- dr croitoru   hx  CABG x2  1990 and re-do 2004;  multiple PCI to RCA 1990 to 2004;    cardiac cath  2012  occluded LAD and RCA with patent grafts  . Difficult intubation    " Dr. Sallyanne Kuster said that it was difficulty to get the tube in."  . History of coronary angioplasty    multiple PCI to RCA ,  1990 to 2004  . History of GI bleed followed by GI-- Olevia Perches PA @ [facility] in [place]   01/ 2020  upper and lower GI bleed ;  s/p  EGD with cautery of jejunal bleed and blood transfusion's  . History of kidney stones   . Hx of echocardiogram 06/29/2008   EF 45-50%   . Hypertension   . Ischemic cardiomyopathy    previously reported , ef 40-45%;  2010--- ef 45-50%;   last echo 2017 55-60%  . Mixed hyperlipidemia   . Nocturia   . OSA on CPAP    cpap, 12, last sleep study Jan2013, sees Dr. Keturah Barre  . PAF (paroxysmal atrial fibrillation) (HCC)    long hx PAF--- followed by dr croitoru  . Post-polio syndrome    dx polio age 7;   09-30-2018  per pt occasional gait issues and occasion fall  . Pseudoarthrosis of lumbar spine   . Rotator cuff tear, left   . S/P CABG  x 2    1990 x2  and 2004  x2  . Sinus node dysfunction ([place])    2015--- sinus node arrest  with intact AV conduction  . Type 2 diabetes mellitus ([place])    followed by pcp    Past Surgical History:  Procedure Laterality Date  . APPENDECTOMY  child  . BLEPHAROPLASTY Bilateral 12/2014   upper eyelid  . CARDIAC PACEMAKER PLACEMENT  2008  . CARDIOVERSION N/A 01/15/2019   Procedure: CARDIOVERSION;  Surgeon: Sanda Klein, MD;  Location: [place] [facility] ENDOSCOPY;  Service: Cardiovascular;  Laterality: N/A;  . CARPAL TUNNEL RELEASE Bilateral 2015;  2016  . CATARACT EXTRACTION W/ INTRAOCULAR LENS  IMPLANT, BILATERAL  2017  . CORONARY ANGIOPLASTY  1990  to 2004   multiple to RCA  . CORONARY ARTERY BYPASS GRAFT  1990    @ [place] (dr chitwood)   seq. LIMA to LAD and Diagonal  . CORONARY ARTERY BYPASS GRAFT  2004     dr Amador Cunas   SVG to  RCA and PDA;  ALSO MAZE  PROCEDURE  . ESOPHAGOGASTRODUODENOSCOPY Left 03/18/2013   Procedure: ESOPHAGOGASTRODUODENOSCOPY (EGD);  Surgeon: Cleotis Nipper, MD;  Location: [facility] ENDOSCOPY;  Service: Endoscopy;  Laterality: Left;  . HARDWARE REMOVAL N/A 05/18/2016   Procedure: Removal of broken hardware Lumbar three-four;  Surgeon: [name], MD;  Location: [place];  Service: Neurosurgery;  Laterality: N/A;  . HERNIA REPAIR    . LEFT HEART CATH AND CORS/GRAFTS ANGIOGRAPHY N/A 12/18/2018   Procedure: LEFT HEART CATH AND CORS/GRAFTS ANGIOGRAPHY;  Surgeon: Wellington Hampshire, MD;  Location: [place] CV LAB;  Service: Cardiovascular;  Laterality: N/A;  . LEFT HEART CATHETERIZATION WITH CORONARY/GRAFT ANGIOGRAM N/A 12/21/2010   Procedure: LEFT HEART CATHETERIZATION WITH Beatrix Fetters;  Surgeon: Leonie Man, MD;  Location: [facility] CATH LAB;  Service: Cardiovascular;  Laterality: N/A;  . LEG SURGERY Right 2002  approx.   lengthened his leg  . LUMBAR LAMINECTOMY/DECOMPRESSION MICRODISCECTOMY  01/10/2012   Procedure: LUMBAR LAMINECTOMY/DECOMPRESSION MICRODISCECTOMY 1  LEVEL;  Surgeon: [name], MD;  Location: [place] NEURO ORS;  Service: Neurosurgery;  Laterality: Bilateral;  Lumbar three-four decompression, Posterior lateral fusion lumbar three-four, posterior spinus plate lumbar three-four  . LUMBAR  LAMINECTOMY/DECOMPRESSION MICRODISCECTOMY Left 05/18/2016   Procedure: Laminectomy and Foraminotomy - Lumbar two-lumbar three- Lumbar four-lumbar five- left with removal hardware lumbar three-four;  Surgeon: [name], MD;  Location: [place];  Service: Neurosurgery;  Laterality: Left;  . NASAL SEPTUM SURGERY  yrs ago  . PACEMAKER REVISION N/A 03/07/2012   Procedure: PACEMAKER REVISION;  Surgeon: Sanda Klein, MD;  Location: [place] CATH LAB;  Service: Cardiovascular;  Laterality: N/A;  . POSTERIOR LUMBAR FUSION  02-22-2017   dr Ronnald Ramp  @ [facility]   re-do laminectomy L2-3 and fusion  . POSTERIOR LUMBAR FUSION  03-11-2018   dr Ronnald Ramp @ [facility]   laminectomy L4-5;  fixation L2-S1 and fusion L3-S1  . SHOULDER ARTHROSCOPY Right after 2016, pt unsure year  . SHOULDER ARTHROSCOPY WITH OPEN ROTATOR CUFF REPAIR Right 03/30/2014   Procedure: RIGHT SHOULDER ARTHROSCOPY  OPEN ROTATOR CUFF REPAIR;  Surgeon: Kerin Salen, MD;  Location: [place];  Service: Orthopedics;  Laterality: Right;  . SHOULDER ARTHROSCOPY WITH SUBACROMIAL DECOMPRESSION, ROTATOR CUFF REPAIR AND BICEP TENDON REPAIR Left 10/01/2018   Procedure: LEFT SHOULDER ARTHROSCOPY, DISTAL CLAVICLE EXCISION,SUBACROMIAL, DECOMPRESSION, RORATOR CUFF REPAIR, BICEPS TENODESIS;  Surgeon: Renette Butters, MD;  Location: [place];  Service: Orthopedics;  Laterality: Left;  . SHOULDER OPEN ROTATOR CUFF REPAIR Right 03/30/2014   Procedure: ROTATOR CUFF REPAIR SHOULDER OPEN;  Surgeon: Kerin Salen, MD;  Location: [place];  Service: Orthopedics;  Laterality: Right;  . TEE WITHOUT CARDIOVERSION N/A 01/15/2019   Procedure: TRANSESOPHAGEAL ECHOCARDIOGRAM (TEE);  Surgeon: Sanda Klein, MD;  Location: [place];  Service:  Cardiovascular;  Laterality: N/A;  . TONSILLECTOMY  child  . TOTAL KNEE ARTHROPLASTY Left 2006    MEDICATIONS: . amiodarone (PACERONE) 200 MG tablet  . amLODipine (NORVASC) 10 MG tablet  . Ascorbic Acid (VITAMIN C) 1000 MG tablet  . Calcium Carb-Cholecalciferol (CALCIUM 600+D3 PO)  . carboxymethylcellulose (REFRESH PLUS) 0.5 % SOLN  . carvedilol (COREG) 25 MG tablet  . colchicine 0.6 MG tablet  . diclofenac sodium (VOLTAREN) 1 % GEL  . Flaxseed, Linseed, (FLAXSEED OIL MAX STR) 1300 MG CAPS  . fluticasone (FLONASE) 50 MCG/ACT nasal spray  . furosemide (LASIX) 40 MG tablet  . glipiZIDE (GLUCOTROL) 10 MG tablet  . hydrochlorothiazide (HYDRODIURIL) 25 MG tablet  . insulin lispro (HUMALOG) 100 UNIT/ML injection  . JANUMET 50-1000 MG tablet  . losartan (COZAAR) 100 MG tablet  . Magnesium 250 MG TABS  . Multiple Vitamin (MULTIVITAMIN WITH MINERALS) TABS tablet  . Omega-3 Fatty Acids (FISH OIL) 1200 MG CAPS  . potassium chloride 20 MEQ TBCR  . warfarin (COUMADIN) 5 MG tablet   . sodium chloride flush (NS) 0.9 % injection 3 mL   He reports that he is not taking Lasix 40 mg every day.   Myra Gianotti, PA-C Surgical Short Stay/Anesthesiology [facility] Phone [phone] [facility] Phone [phone] 03/14/2019 4:55 PM

## 2019-03-13 NOTE — Pre-Procedure Instructions (Signed)
Alan [name]  03/13/2019     [place] ([place]), [place] - [address] [place] ([place]) [place] [address] Phone: [phone] Fax: [phone]    Your procedure is scheduled on Monday, March 17, 2019.  Report to [facility] Admitting at 8:55 A.M.  Call this number if you have problems the morning of surgery:  [phone]   Remember: Follow your doctors instructions regarding warfarin (COUMADIN)   Do not eat or drink after midnight the night before surgery.   Take these medicines the morning of surgery with A SIP OF WATER:   amiodarone (PACERONE)   amLODipine (NORVASC)  carvedilol (COREG) If needed: colchicine for gout flare ups If needed: fluticasone (FLONASE)  nasal spray for allergies If needed:carboxymethylcellulose (REFRESH PLUS) eye drops for dry/irritated eyes  Stop taking Aspirin (unless otherwise advised by surgeon),  all vitamins, fish oil, Flaxseed Oil  and herbal medications. Do not take any NSAIDs ie: Ibuprofen, Advil, Naproxen (Aleve), Motrin, diclofenac sodium (VOLTAREN),  BC and Goody Powder; stop now.    How to Manage Your Diabetes Before and After Surgery  Why is it important to control my blood sugar before and after surgery? . Improving blood sugar levels before and after surgery helps healing and can limit problems. . A way of improving blood sugar control is eating a healthy diet by: o  Eating less sugar and carbohydrates o  Increasing activity/exercise o  Talking with your doctor about reaching your blood sugar goals . High blood sugars (greater than 180 mg/dL) can raise your risk of infections and slow your recovery, so you will need to focus on controlling your diabetes during the weeks before surgery. . Make sure that the doctor who takes care of your diabetes knows about your planned surgery including the date and location.  How do I manage my blood sugar before surgery? . Check your  blood sugar at least 4 times a day, starting 2 days before surgery, to make sure that the level is not too high or low. o Check your blood sugar the morning of your surgery when you wake up and every 2 hours until you get to the Short Stay unit. . If your blood sugar is less than 70 mg/dL, you will need to treat for low blood sugar: o Do not take insulin. o Treat a low blood sugar (less than 70 mg/dL) with  cup of clear juice (cranberry or apple), 4 glucose tablets, OR glucose gel. Recheck blood sugar in 15 minutes after treatment (to make sure it is greater than 70 mg/dL). If your blood sugar is not greater than 70 mg/dL on recheck, call [phone] o  for further instructions. . Report your blood sugar to the short stay nurse when you get to Short Stay.  . If you are admitted to the hospital after surgery: o Your blood sugar will be checked by the staff and you will probably be given insulin after surgery (instead of oral diabetes medicines) to make sure you have good blood sugar levels. o The goal for blood sugar control after surgery is 80-180 mg/dL.  WHAT DO I DO ABOUT MY DIABETES MEDICATION?  . THE NIGHT BEFORE SURGERY DO NOT TAKE glipiZIDE (GLUCOTROL) (only take the morning dose of Glipizide the day before surgery).  . Do not take oral diabetes medicines (pills) the morning of surgery such as JANUMET and glipiZIDE (GLUCOTROL).    . If your CBG is greater than  220 mg/dL, you may take  of your sliding scale (correction) dose of insulin.  Reviewed and Endorsed by [name] Patient Education Committee, August 2015   Do not wear jewelry  Do not wear lotions, powders, cologne, or deodorant.  Do not shave 48 hours prior to surgery.  Men may shave face and neck.  Do not bring valuables to the hospital.  [facility] is not responsible for any belongings or valuables.  Contacts, dentures or bridgework may not be worn into surgery.  Leave your suitcase in the car.  After surgery it may  be brought to your room.  For patients admitted to the hospital, discharge time will be determined by your treatment team.  Patients discharged the day of surgery will not be allowed to drive home.   Special instructions: See " [facility] Preparing For Surgery " sheet.   Please read over the following fact sheets that you were given. Pain Booklet, Coughing and Deep Breathing and Surgical Site Infection Prevention

## 2019-03-14 ENCOUNTER — Ambulatory Visit: Payer: Medicare HMO

## 2019-03-14 ENCOUNTER — Other Ambulatory Visit: Payer: Self-pay | Admitting: Neurological Surgery

## 2019-03-14 NOTE — Anesthesia Preprocedure Evaluation (Deleted)
Anesthesia Evaluation Anesthesia Physical Anesthesia Plan  ASA:   Anesthesia Plan:    Post-op Pain Management:    Induction:   PONV Risk Score and Plan:   Airway Management Planned:   Additional Equipment:   Intra-op Plan:   Post-operative Plan:   Informed Consent:   Plan Discussed with:   Anesthesia Plan Comments: (See PAT note written 03/14/2019 by Myra Gianotti, PA-C. He has a Medtronic PPM. He is for STAT CBC, BMET, PT/INR on arrival to [facility]. Results may determine if he proceeds with surgery. His PCP is Dr. Melford Aase, office number [phone]. Cardiologist is Dr. Sallyanne Kuster.    )        Anesthesia Quick Evaluation

## 2019-03-17 ENCOUNTER — Ambulatory Visit: Payer: Medicare HMO

## 2019-03-17 ENCOUNTER — Inpatient Hospital Stay (HOSPITAL_COMMUNITY): Admission: RE | Admit: 2019-03-17 | Payer: Medicare HMO | Source: Home / Self Care | Admitting: Neurological Surgery

## 2019-03-17 ENCOUNTER — Encounter (HOSPITAL_COMMUNITY): Admission: RE | Payer: Self-pay | Source: Home / Self Care

## 2019-03-17 SURGERY — POSTERIOR LUMBAR FUSION 1 WITH HARDWARE REMOVAL
Anesthesia: General | Site: Back

## 2019-03-18 ENCOUNTER — Ambulatory Visit: Payer: Medicare HMO

## 2019-03-20 ENCOUNTER — Ambulatory Visit: Payer: Medicare HMO

## 2019-04-03 ENCOUNTER — Other Ambulatory Visit (HOSPITAL_COMMUNITY)
Admission: RE | Admit: 2019-04-03 | Discharge: 2019-04-03 | Disposition: A | Discharge status: Home / Self Care | Payer: Medicare HMO | Source: Ambulatory Visit | Attending: Neurological Surgery | Admitting: Neurological Surgery

## 2019-04-03 ENCOUNTER — Telehealth: Payer: Self-pay | Admitting: Internal Medicine

## 2019-04-03 ENCOUNTER — Encounter (HOSPITAL_COMMUNITY): Payer: Self-pay | Admitting: Neurological Surgery

## 2019-04-03 DIAGNOSIS — Z20822 Contact with and (suspected) exposure to covid-19: Secondary | ICD-10-CM | POA: Insufficient documentation

## 2019-04-03 DIAGNOSIS — Z01812 Encounter for preprocedural laboratory examination: Secondary | ICD-10-CM | POA: Diagnosis present

## 2019-04-03 DIAGNOSIS — G4733 Obstructive sleep apnea (adult) (pediatric): Secondary | ICD-10-CM

## 2019-04-03 LAB — SARS CORONAVIRUS 2 (TAT 6-24 HRS): SARS Coronavirus 2: NEGATIVE

## 2019-04-03 NOTE — Telephone Encounter (Signed)
Spoke with Sonia Baller from [employer]  Pt's machine does not have auto capability  She states that they are needing order for 2 wk auto loaner Please advise if ok to send, thanks

## 2019-04-04 ENCOUNTER — Encounter (HOSPITAL_COMMUNITY): Payer: Self-pay | Admitting: Neurological Surgery

## 2019-04-04 ENCOUNTER — Other Ambulatory Visit: Payer: Self-pay

## 2019-04-04 ENCOUNTER — Other Ambulatory Visit: Payer: Self-pay | Admitting: Neurological Surgery

## 2019-04-04 ENCOUNTER — Telehealth: Payer: Self-pay | Admitting: Internal Medicine

## 2019-04-04 NOTE — Progress Notes (Signed)
Spoke with pt for pre-op call. Pt has hx of CAD with CABG, A-fib and has a pacemaker (form faxed to [facility]). Pt's cardiologist is Dr. Sallyanne Kuster and clearance noted in [facility] on 03/07/19. Pt is on Coumadin, last dose was 03/30/19. Pt denies any recent chest pain or sob. Pt had a previous PAT appt on 03/13/19 but surgery got rescheduled. He states nothing has changed with his medical history or medications. Pt is a type 2 diabetic. Last A1C was 6.2 on 03/13/19. He states his fasting blood sugar has been around 137. Pt instructed not to take his Glipizide Sunday PM or Monday AM, no Januvia Monday AM. Instructed pt to check his blood sugar when he gets up Monday AM and every 2 hours until he leaves for the hospital. If blood sugar is >220 take 1/2 of usual correction dose of Humalog insulin. If blood sugar is 70 or below, treat with 1/2 cup of clear juice (apple or cranberry) and recheck blood sugar 15 minutes after drinking juice. If blood sugar continues to be 70 or below, call the Short Stay department and ask to speak to a nurse. Pt voiced understanding.  Covid test done 04/03/19 and it's negative. Pt states he also got his 2nd Covid Vaccine yesterday. He states he's been in quarantine since the test was done and he understands that quarantine continues until he comes to the hospital on Monday.   See Allison's anesthesia note from 03/13/19.

## 2019-04-04 NOTE — Progress Notes (Signed)
Anesthesia Chart Review: SAME DAY WORK-UP  Procedure: Posterior lateral fusion - L5-S1, removal of segmental fixation L2-S1 (N/A Back)    Anesthesia type: General   Pre-op diagnosis: Pseudoarthrosis   Location: [facility] OR   Surgeons: [name], MD     DISCUSSION: Patient is a 79 year old male scheduled for the above procedure. Surgery was initially scheduled for 03/17/19, but postponing was recommended by his PCP Dr. Melford Aase due to worsening anemia in setting of known recurrent GI bleeds. See my note from 03/14/19 for additional details. Since then he had a repeat CBC through [facility] that showed a mild, but further, increase in his H/H (up to 10.5/33.0). and surgery rescheduled for 04/07/19.  History includes never smoker, DIFFICULT INTUBATION (no difficulty noted on last 3 intubation in 2020), CAD(s/p CABG 1990[LIMA-LAD-DIAG]; multiple RCA PCI 1990-2004; redoCABG [SVG-RCA-PDA] with MAZE 2004, Dr. Amador Cunas), ischemic cardiomyopathy, complete heart block(s/p Medtronic PPM 2/8/08with generator change 03/07/12), afib (DCCV 06/23/10;warfarind/c'd 2015 d/t GIB; recurrent afib 06/2018 and started on Eliquis, s/p conversion with overdrive pacing 0/93/81; recurrent 10/2018, changed to warfarin due to [facility] 12/2018, s/p DCCV 01/15/19), GI bleed (s/p cauterization gastric lesion ~ 05/2013; GI bleed 02/04/18 and 02/15/18 even while off anticoagulants), DM2, HTN, CKD (stage III), OSA(s/p UPPP, CPAP), post-polio syndrome(effectingLLE),back surgeries (L3-4 laminectomy 01/10/12;L3-4 PLIF 07/2012; removal L3-4 posterior instrumentation and decompressive hemilaminectomy/foraminotomy L2-3/L4-5 05/18/16; L2-3 PLIF 02/22/17; L4-5 PLIF, posterior fixation L2-S1 03/11/18), left shoulder arthroscopy/rotator cuff repair (10/01/18).   - Preoperative pulmonology clearance per Dr. Annamaria Boots on 03/12/19, "Patient is not at increased risk for surgical complication due to pulmonary problems based on this  examination."  - Preoperative cardiology clearance as outlined by Sande Rives, PA-C on 03/07/19 includes, "...Given past medical history and time since last visit, based on ACC/AHA guidelines,Alan M Bayneswould be at acceptable risk for the planned procedure without further cardiovascular testing.  Per Pharmacy: '[place] to hold warfarin for 5 days prior to spinal procedure.' Given history of atrial flutter/fibrillation with recent cardioversion, recommend monitoring on telemetry throughout peri-operative period."As of 02/25/19 PPM transmission, no afib following cardioversion.  His BMET, PT/INR and T&S are outdated, so he will need those rechecked on the day of surgery. Below are his most recent trends. See my previous note from 03/14/19 that documents my conversation with the primary care, anesthesia , and surgeon staff regarding anemia and elevated creatinine. Since he is a same day work-up, renal function will be rechecked with day of surgery labs which I again spoke with Lorriane Shire at Dr. Ronnald Ramp' office about.  Current recent labs trends include:    03/26/19 ([facility]) 03/13/2019 03/10/2019 ([facility]) 01/15/19 ([facility]) 01/13/19  HGB 10.5 10.0 9.8 11.9 11.6  HCT 33.0 31.9 29.1 35.0 34.1  BUN  51 45 44 58  Creatinine  2.23 2.00 1.70 1.78  Potassium  5.3 5.7 4.9 5.4  Creatinine has primarily been ~ 1.4-1.70 for the past year, but up to the 2 range in 03/2019   04/03/19 preoperative COVID-19 test was negative. Also reported that 2nd COVID-19 vaccine was given on 04/03/19. Last warfarin 03/30/19.  Anesthesia team to evaluate and review same day labs on the day of surgery. Medtronic PPM perioperative device form is still pending from [facility].   PROVIDERS: [name], MDis PCP ([place], see Care Everywhere). -[name], Mihai, MD is primary cardiologist - Baird Lyons, MD is pulmonologist. Last evaluation 03/12/19 for preoperative evaluation and OSA follow-up. Lilyan Gilford, PA-C is with  [facility]   LABS: See DISCUSSION. His A1c was 6.2& on 03/13/19.     Spirometry 02/26/18:MildRestriction of exhaled volume. FVC 2.9/69%, FEV1 2.4/81%, ratio 1.84, FEF 25-75% 3.1/145%.   IMAGES: CXR 03/13/19: FINDINGS: Frontal and lateral views of the chest demonstrates stable enlargement of the cardiac silhouette. Multi lead pacemaker again noted and unchanged. No airspace disease, effusion, or pneumothorax. Lumbar fusion hardware is identified. Multilevel thoracic spondylosis. IMPRESSION: 1. Stable chest, no acute process.   EKG:01/13/19: Ventricular paced rhythm   CV: TEE (prior to DCCV) 01/15/19: IMPRESSIONS   1. Left ventricular ejection fraction, by visual estimation, is 55 to  60%. The left ventricle has normal function. Normal left ventricular size.  There is no left ventricular hypertrophy.  2. Abnormal septal motion consistent with RV pacemaker.  3. The left ventricle has no regional wall motion abnormalities.  4. Global right ventricle has normal systolic function.The right  ventricular size is normal. No increase in right ventricular wall  thickness.  5. Left atrial size was moderately dilated.  6. The left atrial appendage is surgically clipped, but there appears to  be a permeable lobe, in the immediate vicinity of the left upper pulmonary  vein. No thrombus is seen in the left atrium.  7. Right atrial size was mildly dilated.  8. The mitral valve is normal in structure. Mild mitral valve  regurgitation.  9. The tricuspid valve is normal in structure. Tricuspid valve  regurgitation is trivial.  10. The aortic valve is normal in structure. Aortic valve regurgitation is  not visualized.  11. The pulmonic valve was normal in structure. Pulmonic valve  regurgitation is not visualized.  12. A pacer wire is visualized.  13. Pacemaker leads present in the right atrium. There is a tiny  filamentous mobile  mass (2x7 mm) on one of the leads, possibly a thrombus,  chronicity unknown.    Echo (TTE) 12/30/18: IMPRESSIONS  1. Left ventricular ejection fraction, by visual estimation, is 45 to  50%. The left ventricle has mildly decreased function. There is no left  ventricular hypertrophy.  2. Left ventricular diastolic parameters are consistent with Grade II  diastolic dysfunction (pseudonormalization).  3. Mildly dilated left ventricular internal cavity size.  4. The left ventricle demonstrates global hypokinesis.  5. Global right ventricle has normal systolic function.The right  ventricular size is normal. No increase in right ventricular wall  thickness.  6. Left atrial size was mildly dilated.  7. Right atrial size was mildly dilated.  8. The mitral valve is normal in structure. Trace mitral valve  regurgitation. No evidence of mitral stenosis.  9. The tricuspid valve is normal in structure. Tricuspid valve  regurgitation is mild.  10. The aortic valve is tricuspid. Aortic valve regurgitation is not  visualized. No evidence of aortic valve sclerosis or stenosis.  11. The pulmonic valve was normal in structure. Pulmonic valve  regurgitation is mild.  12. Mildly elevated pulmonary artery systolic pressure.  13. A pacer wire is visualized.  14. The inferior vena cava is normal in size with <50% respiratory  variability, suggesting right atrial pressure of 8 mmHg.    Cardiac cath 12/18/18:  Prox LAD lesion is 100% stenosed.  2nd Mrg lesion is 40% stenosed.  Dist Cx lesion is 99% stenosed.  The left ventricular systolic function is normal.  LV end diastolic pressure is mildly elevated.  The left ventricular ejection fraction is 50-55% by visual estimate.  Prox RCA lesion is 100% stenosed.  Mid RCA to  Dist RCA lesion is 70% stenosed.  SVG.  The graft exhibits no disease.  LIMA and is normal in caliber.  The graft exhibits no disease. 1. Significant  underlying three-vessel coronary artery disease with patent grafts including LIMA to LAD/diagonal and SVG to right PDA. There is significant diffuse disease in the distal left circumflex (AV groove) supplying a small area. 2. Normal LV systolic function with mildly elevated left ventricular end-diastolic pressure at 16 mmHg. Recommendations: I recommend continuing medical therapy. The AV groove left circumflex is too small and diseased in a long segment. It supplies a small area and I doubt that this is giving him much symptoms. The patient likely has diastolic dysfunction given elevated LVEDP.   Carotid U/S 01/19/16:  Impression: This study is negative for hemodynamically significant stenosis involving extracranial carotid and vertebral arteries bilaterally.   Past Medical History:  Diagnosis Date  . Anticoagulant long-term use   . Arthritis   . Arthropathy of left shoulder   . Biceps tendonitis, left   . Bursitis of left shoulder   . Cardiac pacemaker in situ    first insertion 2008;  genertor and new lead change 03-08-2012;   Medtronic  . CHB (complete heart block) ([place])    s/p  PPM 2008  . Chronic back pain   . CKD (chronic kidney disease), stage III   . Coronary artery disease cardiologist--- dr croitoru   hx  CABG x2  1990 and re-do 2004;  multiple PCI to [place] to 2004;    cardiac cath 2012  occluded LAD and RCA with patent grafts  . Difficult intubation    " Dr. Sallyanne Kuster said that it was difficulty to get the tube in."  . History of coronary angioplasty    multiple PCI to RCA ,  1990 to 2004  . History of GI bleed followed by GI-- Olevia Perches PA @ [facility] in [place]   01/ 2020  upper and lower GI bleed ;  s/p  EGD with cautery of jejunal bleed and blood transfusion's  . History of kidney stones   . Hx of echocardiogram 06/29/2008   EF 45-50%   . Hypertension   . Ischemic cardiomyopathy    previously reported , ef 40-45%;  2010--- ef 45-50%;    last echo 2017 55-60%  . Mixed hyperlipidemia   . Nocturia   . OSA on CPAP    cpap, 12, last sleep study Jan2013, sees Dr. Keturah Barre  . PAF (paroxysmal atrial fibrillation) (HCC)    long hx PAF--- followed by dr croitoru  . Post-polio syndrome    dx polio age 40;   09-30-2018  per pt occasional gait issues and occasion fall  . Presence of permanent cardiac pacemaker   . Pseudoarthrosis of lumbar spine   . Rotator cuff tear, left   . S/P CABG x 2    1990 x2  and 2004  x2  . Sinus node dysfunction ([place])    2015--- sinus node arrest  with intact AV conduction  . Type 2 diabetes mellitus ([place])    followed by pcp    Past Surgical History:  Procedure Laterality Date  . APPENDECTOMY  child  . BLEPHAROPLASTY Bilateral 12/2014   upper eyelid  . CARDIAC PACEMAKER PLACEMENT  2008  . CARDIOVERSION N/A 01/15/2019   Procedure: CARDIOVERSION;  Surgeon: Sanda Klein, MD;  Location: [facility] ENDOSCOPY;  Service: Cardiovascular;  Laterality: N/A;  . CARPAL TUNNEL RELEASE Bilateral 2015;  2016  . CATARACT  EXTRACTION W/ INTRAOCULAR LENS  IMPLANT, BILATERAL  2017  . CORONARY ANGIOPLASTY  1990  to 2004   multiple to RCA  . CORONARY ARTERY BYPASS GRAFT  1990    @ [place] (dr chitwood)   seq. LIMA to LAD and Diagonal  . CORONARY ARTERY BYPASS GRAFT  2004     dr Amador Cunas   SVG to  RCA and PDA;  ALSO MAZE  PROCEDURE  . ESOPHAGOGASTRODUODENOSCOPY Left 03/18/2013   Procedure: ESOPHAGOGASTRODUODENOSCOPY (EGD);  Surgeon: Cleotis Nipper, MD;  Location: [facility] ENDOSCOPY;  Service: Endoscopy;  Laterality: Left;  . HARDWARE REMOVAL N/A 05/18/2016   Procedure: Removal of broken hardware Lumbar three-four;  Surgeon: [name], MD;  Location: [facility];  Service: Neurosurgery;  Laterality: N/A;  . HERNIA REPAIR    . LEFT HEART CATH AND CORS/GRAFTS ANGIOGRAPHY N/A 12/18/2018   Procedure: LEFT HEART CATH AND CORS/GRAFTS ANGIOGRAPHY;  Surgeon: Wellington Hampshire, MD;  Location: [place] [facility] CV LAB;  Service:  Cardiovascular;  Laterality: N/A;  . LEFT HEART CATHETERIZATION WITH CORONARY/GRAFT ANGIOGRAM N/A 12/21/2010   Procedure: LEFT HEART CATHETERIZATION WITH Beatrix Fetters;  Surgeon: Leonie Man, MD;  Location: [facility] CATH LAB;  Service: Cardiovascular;  Laterality: N/A;  . LEG SURGERY Right 2002  approx.   lengthened his leg  . LUMBAR LAMINECTOMY/DECOMPRESSION MICRODISCECTOMY  01/10/2012   Procedure: LUMBAR LAMINECTOMY/DECOMPRESSION MICRODISCECTOMY 1 LEVEL;  Surgeon: [name], MD;  Location: [facility] NEURO ORS;  Service: Neurosurgery;  Laterality: Bilateral;  Lumbar three-four decompression, Posterior lateral fusion lumbar three-four, posterior spinus plate lumbar three-four  . LUMBAR LAMINECTOMY/DECOMPRESSION MICRODISCECTOMY Left 05/18/2016   Procedure: Laminectomy and Foraminotomy - Lumbar two-lumbar three- Lumbar four-lumbar five- left with removal hardware lumbar three-four;  Surgeon: [name], MD;  Location: [facility];  Service: Neurosurgery;  Laterality: Left;  . NASAL SEPTUM SURGERY  yrs ago  . PACEMAKER REVISION N/A 03/07/2012   Procedure: PACEMAKER REVISION;  Surgeon: Sanda Klein, MD;  Location: [place] CATH LAB;  Service: Cardiovascular;  Laterality: N/A;  . POSTERIOR LUMBAR FUSION  02-22-2017   dr Ronnald Ramp  @ [facility]   re-do laminectomy L2-3 and fusion  . POSTERIOR LUMBAR FUSION  03-11-2018   dr Ronnald Ramp @ [facility]   laminectomy L4-5;  fixation L2-S1 and fusion L3-S1  . SHOULDER ARTHROSCOPY Right after 2016, pt unsure year  . SHOULDER ARTHROSCOPY WITH OPEN ROTATOR CUFF REPAIR Right 03/30/2014   Procedure: RIGHT SHOULDER ARTHROSCOPY  OPEN ROTATOR CUFF REPAIR;  Surgeon: Kerin Salen, MD;  Location: [place];  Service: Orthopedics;  Laterality: Right;  . SHOULDER ARTHROSCOPY WITH SUBACROMIAL DECOMPRESSION, ROTATOR CUFF REPAIR AND BICEP TENDON REPAIR Left 10/01/2018   Procedure: LEFT SHOULDER ARTHROSCOPY, DISTAL CLAVICLE EXCISION,SUBACROMIAL, DECOMPRESSION, RORATOR CUFF REPAIR, BICEPS TENODESIS;  Surgeon:  Renette Butters, MD;  Location: [place];  Service: Orthopedics;  Laterality: Left;  . SHOULDER OPEN ROTATOR CUFF REPAIR Right 03/30/2014   Procedure: ROTATOR CUFF REPAIR SHOULDER OPEN;  Surgeon: Kerin Salen, MD;  Location: [place];  Service: Orthopedics;  Laterality: Right;  . TEE WITHOUT CARDIOVERSION N/A 01/15/2019   Procedure: TRANSESOPHAGEAL ECHOCARDIOGRAM (TEE);  Surgeon: Sanda Klein, MD;  Location: [place];  Service: Cardiovascular;  Laterality: N/A;  . TONSILLECTOMY  child  . TOTAL KNEE ARTHROPLASTY Left 2006    MEDICATIONS: . sodium chloride flush (NS) 0.9 % injection 3 mL   . amiodarone (PACERONE) 200 MG tablet  . amLODipine (NORVASC) 10 MG tablet  . Ascorbic Acid (VITAMIN C) 1000 MG tablet  .  Calcium Carb-Cholecalciferol (CALCIUM 600+D3 PO)  . carboxymethylcellulose (REFRESH PLUS) 0.5 % SOLN  . carvedilol (COREG) 25 MG tablet  . colchicine 0.6 MG tablet  . diclofenac sodium (VOLTAREN) 1 % GEL  . Flaxseed, Linseed, (FLAXSEED OIL MAX STR) 1300 MG CAPS  . fluticasone (FLONASE) 50 MCG/ACT nasal spray  . furosemide (LASIX) 40 MG tablet  . glipiZIDE (GLUCOTROL) 10 MG tablet  . hydrochlorothiazide (HYDRODIURIL) 25 MG tablet  . insulin lispro (HUMALOG) 100 UNIT/ML injection  . JANUMET 50-1000 MG tablet  . losartan (COZAAR) 100 MG tablet  . Magnesium 250 MG TABS  . Multiple Vitamin (MULTIVITAMIN WITH MINERALS) TABS tablet  . Omega-3 Fatty Acids (FISH OIL) 1200 MG CAPS  . potassium chloride 20 MEQ TBCR  . warfarin (COUMADIN) 5 MG tablet    Myra Gianotti, PA-C Surgical Short Stay/Anesthesiology [facility] Phone [phone] [facility] Phone [phone] 04/04/2019 2:02 PM

## 2019-04-04 NOTE — Telephone Encounter (Signed)
Order placed to [facility]. 

## 2019-04-04 NOTE — Telephone Encounter (Signed)
Ok to request Engineer, [employer] - thanks

## 2019-04-04 NOTE — Telephone Encounter (Addendum)
Order was sent to [employer] today for his loaner  ATC and inform the pt- LMTCB

## 2019-04-04 NOTE — Anesthesia Preprocedure Evaluation (Addendum)
Anesthesia Evaluation  Patient identified by MRN, date of birth, ID band Patient awake    Reviewed: Allergy & Precautions, H&P , NPO status , Patient's Chart, lab work & pertinent test results, reviewed documented beta blocker date and time   History of Anesthesia Complications (+) DIFFICULT AIRWAY  Airway Mallampati: II  TM Distance: >3 FB Neck ROM: Full    Dental no notable dental hx. (+) Teeth Intact, Dental Advisory Given   Pulmonary sleep apnea and Continuous Positive Airway Pressure Ventilation ,    Pulmonary exam normal breath sounds clear to auscultation       Cardiovascular hypertension, Pt. on medications and Pt. on home beta blockers + CAD, + Cardiac Stents and + CABG  + dysrhythmias Atrial Fibrillation + pacemaker  Rhythm:Regular Rate:Normal     Neuro/Psych negative neurological ROS  negative psych ROS   GI/Hepatic negative GI ROS, Neg liver ROS,   Endo/Other  diabetes, Insulin Dependent, Oral Hypoglycemic Agents  Renal/GU Renal InsufficiencyRenal disease  negative genitourinary   Musculoskeletal  (+) Arthritis , Osteoarthritis,    Abdominal   Peds  Hematology negative hematology ROS (+) Blood dyscrasia, anemia ,   Anesthesia Other Findings   Reproductive/Obstetrics negative OB ROS                            Anesthesia Physical Anesthesia Plan  ASA: III  Anesthesia Plan: General   Post-op Pain Management:    Induction: Intravenous  PONV Risk Score and Plan: 3 and Ondansetron, Dexamethasone and Treatment may vary due to age or medical condition  Airway Management Planned: Oral ETT and Video Laryngoscope Planned  Additional Equipment: Arterial line  Intra-op Plan:   Post-operative Plan: Extubation in OR  Informed Consent: I have reviewed the patients History and Physical, chart, labs and discussed the procedure including the risks, benefits and alternatives for the  proposed anesthesia with the patient or authorized representative who has indicated his/her understanding and acceptance.     Dental advisory given  Plan Discussed with: CRNA  Anesthesia Plan Comments: (See PAT note written 04/04/2019 by Myra Gianotti, PA-C. Surgery rescheduled from last month due to anemia. Last CBC 03/25/18 at [facility] 10.5/33.0. He needs other labs repeated on arrival. Has [employer] PPM. )       Anesthesia Quick Evaluation

## 2019-04-07 ENCOUNTER — Inpatient Hospital Stay (HOSPITAL_COMMUNITY)
Admission: RE | Admit: 2019-04-07 | Discharge: 2019-04-08 | DRG: 460 | Disposition: A | Discharge status: Home / Self Care | Payer: Medicare HMO | Attending: Neurological Surgery | Admitting: Neurological Surgery

## 2019-04-07 ENCOUNTER — Inpatient Hospital Stay (HOSPITAL_COMMUNITY): Payer: Medicare HMO | Admitting: Vascular Surgery

## 2019-04-07 ENCOUNTER — Encounter (HOSPITAL_COMMUNITY)
Admission: RE | Disposition: A | Discharge status: Home / Self Care | Payer: Self-pay | Source: Home / Self Care | Attending: Neurological Surgery

## 2019-04-07 ENCOUNTER — Other Ambulatory Visit: Payer: Self-pay

## 2019-04-07 ENCOUNTER — Encounter (HOSPITAL_COMMUNITY): Payer: Self-pay | Admitting: Neurological Surgery

## 2019-04-07 DIAGNOSIS — Z950 Presence of cardiac pacemaker: Secondary | ICD-10-CM | POA: Diagnosis not present

## 2019-04-07 DIAGNOSIS — N183 Chronic kidney disease, stage 3 unspecified: Secondary | ICD-10-CM | POA: Diagnosis not present

## 2019-04-07 DIAGNOSIS — I442 Atrioventricular block, complete: Secondary | ICD-10-CM | POA: Diagnosis present

## 2019-04-07 DIAGNOSIS — E1122 Type 2 diabetes mellitus with diabetic chronic kidney disease: Secondary | ICD-10-CM | POA: Diagnosis not present

## 2019-04-07 DIAGNOSIS — Z794 Long term (current) use of insulin: Secondary | ICD-10-CM

## 2019-04-07 DIAGNOSIS — Z809 Family history of malignant neoplasm, unspecified: Secondary | ICD-10-CM | POA: Diagnosis not present

## 2019-04-07 DIAGNOSIS — Z9861 Coronary angioplasty status: Secondary | ICD-10-CM | POA: Diagnosis not present

## 2019-04-07 DIAGNOSIS — Z981 Arthrodesis status: Secondary | ICD-10-CM

## 2019-04-07 DIAGNOSIS — Z951 Presence of aortocoronary bypass graft: Secondary | ICD-10-CM | POA: Diagnosis not present

## 2019-04-07 DIAGNOSIS — G4733 Obstructive sleep apnea (adult) (pediatric): Secondary | ICD-10-CM | POA: Diagnosis not present

## 2019-04-07 DIAGNOSIS — I2510 Atherosclerotic heart disease of native coronary artery without angina pectoris: Secondary | ICD-10-CM | POA: Diagnosis present

## 2019-04-07 DIAGNOSIS — E782 Mixed hyperlipidemia: Secondary | ICD-10-CM | POA: Diagnosis not present

## 2019-04-07 DIAGNOSIS — Z7901 Long term (current) use of anticoagulants: Secondary | ICD-10-CM | POA: Diagnosis not present

## 2019-04-07 DIAGNOSIS — Z79899 Other long term (current) drug therapy: Secondary | ICD-10-CM

## 2019-04-07 DIAGNOSIS — I255 Ischemic cardiomyopathy: Secondary | ICD-10-CM | POA: Diagnosis not present

## 2019-04-07 DIAGNOSIS — Z419 Encounter for procedure for purposes other than remedying health state, unspecified: Secondary | ICD-10-CM

## 2019-04-07 DIAGNOSIS — M960 Pseudarthrosis after fusion or arthrodesis: Principal | ICD-10-CM | POA: Diagnosis present

## 2019-04-07 DIAGNOSIS — G14 Postpolio syndrome: Secondary | ICD-10-CM | POA: Diagnosis not present

## 2019-04-07 DIAGNOSIS — I480 Paroxysmal atrial fibrillation: Secondary | ICD-10-CM | POA: Diagnosis not present

## 2019-04-07 HISTORY — DX: Presence of cardiac pacemaker: Z95.0

## 2019-04-07 LAB — CBC
HCT: 33.1 % — ABNORMAL LOW (ref 39.0–52.0)
Hemoglobin: 10.6 g/dL — ABNORMAL LOW (ref 13.0–17.0)
MCH: 30.5 pg (ref 26.0–34.0)
MCHC: 32 g/dL (ref 30.0–36.0)
MCV: 95.4 fL (ref 80.0–100.0)
Platelets: 166 10*3/uL (ref 150–400)
RBC: 3.47 MIL/uL — ABNORMAL LOW (ref 4.22–5.81)
RDW: 13.2 % (ref 11.5–15.5)
WBC: 6.1 10*3/uL (ref 4.0–10.5)
nRBC: 0 % (ref 0.0–0.2)

## 2019-04-07 LAB — BASIC METABOLIC PANEL
Anion gap: 10 (ref 5–15)
BUN: 39 mg/dL — ABNORMAL HIGH (ref 8–23)
CO2: 22 mmol/L (ref 22–32)
Calcium: 9 mg/dL (ref 8.9–10.3)
Chloride: 103 mmol/L (ref 98–111)
Creatinine, Ser: 1.92 mg/dL — ABNORMAL HIGH (ref 0.61–1.24)
GFR calc Af Amer: 38 mL/min — ABNORMAL LOW (ref >60)
GFR calc non Af Amer: 33 mL/min — ABNORMAL LOW (ref >60)
Glucose, Bld: 127 mg/dL — ABNORMAL HIGH (ref 70–99)
Potassium: 4 mmol/L (ref 3.5–5.1)
Sodium: 135 mmol/L (ref 135–145)

## 2019-04-07 LAB — GLUCOSE, CAPILLARY
Glucose-Capillary: 127 mg/dL — ABNORMAL HIGH (ref 70–99)
Glucose-Capillary: 123 mg/dL — ABNORMAL HIGH (ref 70–99)
Glucose-Capillary: 140 mg/dL — ABNORMAL HIGH (ref 70–99)
Glucose-Capillary: 128 mg/dL — ABNORMAL HIGH (ref 70–99)
Glucose-Capillary: 122 mg/dL — ABNORMAL HIGH (ref 70–99)
Glucose-Capillary: 181 mg/dL — ABNORMAL HIGH (ref 70–99)
Glucose-Capillary: 288 mg/dL — ABNORMAL HIGH (ref 70–99)

## 2019-04-07 LAB — PROTIME-INR
INR: 1.1 (ref 0.8–1.2)
Prothrombin Time: 14.5 seconds (ref 11.4–15.2)

## 2019-04-07 LAB — TYPE AND SCREEN
ABO/RH(D): A POS
Antibody Screen: NEGATIVE

## 2019-04-07 SURGERY — POSTERIOR LUMBAR FUSION 1 WITH HARDWARE REMOVAL
Anesthesia: General | Site: Back

## 2019-04-07 MED ORDER — SODIUM CHLORIDE 0.9 % IV SOLN
INTRAVENOUS | Status: DC | PRN
  Administered 2019-04-07: 500 mL

## 2019-04-07 MED ORDER — LINAGLIPTIN 5 MG PO TABS
5.0000 mg | ORAL_TABLET | Freq: Every day | ORAL | Status: DC
  Administered 2019-04-07 – 2019-04-08 (×2): 5 mg via ORAL
  Filled 2019-04-07 (×2): qty 1

## 2019-04-07 MED ORDER — FENTANYL CITRATE (PF) 250 MCG/5ML IJ SOLN
INTRAMUSCULAR | Status: DC | PRN
  Administered 2019-04-07 (×4): 25 ug via INTRAVENOUS
  Administered 2019-04-07 (×2): 50 ug via INTRAVENOUS

## 2019-04-07 MED ORDER — SODIUM CHLORIDE 0.9% FLUSH
3.0000 mL | Freq: Two times a day (BID) | INTRAVENOUS | Status: DC
  Administered 2019-04-07: 3 mL via INTRAVENOUS

## 2019-04-07 MED ORDER — OXYCODONE HCL 5 MG PO TABS
5.0000 mg | ORAL_TABLET | ORAL | Status: DC | PRN
  Administered 2019-04-07: 10 mg via ORAL
  Filled 2019-04-07: qty 2

## 2019-04-07 MED ORDER — BUPIVACAINE HCL (PF) 0.25 % IJ SOLN
INTRAMUSCULAR | Status: DC | PRN
  Administered 2019-04-07: 10 mL

## 2019-04-07 MED ORDER — LIDOCAINE 2% (20 MG/ML) 5 ML SYRINGE
INTRAMUSCULAR | Status: DC | PRN
  Administered 2019-04-07: 40 mg via INTRAVENOUS

## 2019-04-07 MED ORDER — ONDANSETRON HCL 4 MG/2ML IJ SOLN: 4.0000 mg | Freq: Four times a day (QID) | INTRAMUSCULAR | Status: DC | PRN

## 2019-04-07 MED ORDER — HEPARIN SODIUM (PORCINE) 1000 UNIT/ML IJ SOLN
INTRAMUSCULAR | Status: AC
  Filled 2019-04-07: qty 1

## 2019-04-07 MED ORDER — FENTANYL CITRATE (PF) 250 MCG/5ML IJ SOLN
INTRAMUSCULAR | Status: AC
  Filled 2019-04-07: qty 5

## 2019-04-07 MED ORDER — PHENYLEPHRINE 40 MCG/ML (10ML) SYRINGE FOR IV PUSH (FOR BLOOD PRESSURE SUPPORT)
PREFILLED_SYRINGE | INTRAVENOUS | Status: DC | PRN
  Administered 2019-04-07: 80 ug via INTRAVENOUS
  Administered 2019-04-07 (×3): 40 ug via INTRAVENOUS
  Administered 2019-04-07: 80 ug via INTRAVENOUS
  Administered 2019-04-07: 40 ug via INTRAVENOUS

## 2019-04-07 MED ORDER — HYDROCHLOROTHIAZIDE 25 MG PO TABS
25.0000 mg | ORAL_TABLET | Freq: Every day | ORAL | Status: DC
  Administered 2019-04-07 – 2019-04-08 (×2): 25 mg via ORAL
  Filled 2019-04-07 (×2): qty 1

## 2019-04-07 MED ORDER — CEFAZOLIN SODIUM-DEXTROSE 2-4 GM/100ML-% IV SOLN
INTRAVENOUS | Status: AC
  Filled 2019-04-07: qty 100

## 2019-04-07 MED ORDER — 0.9 % SODIUM CHLORIDE (POUR BTL) OPTIME
TOPICAL | Status: DC | PRN
  Administered 2019-04-07: 1000 mL

## 2019-04-07 MED ORDER — CHLORHEXIDINE GLUCONATE CLOTH 2 % EX PADS: 6.0000 | MEDICATED_PAD | Freq: Once | CUTANEOUS | Status: DC

## 2019-04-07 MED ORDER — THROMBIN 5000 UNITS EX SOLR
CUTANEOUS | Status: AC
  Filled 2019-04-07: qty 5000

## 2019-04-07 MED ORDER — SITAGLIPTIN PHOS-METFORMIN HCL 50-1000 MG PO TABS: 1.0000 | ORAL_TABLET | Freq: Two times a day (BID) | ORAL | Status: DC

## 2019-04-07 MED ORDER — SENNA 8.6 MG PO TABS
1.0000 | ORAL_TABLET | Freq: Two times a day (BID) | ORAL | Status: DC
  Administered 2019-04-07 – 2019-04-08 (×2): 8.6 mg via ORAL
  Filled 2019-04-07 (×2): qty 1

## 2019-04-07 MED ORDER — METHOCARBAMOL 1000 MG/10ML IJ SOLN
500.0000 mg | Freq: Four times a day (QID) | INTRAVENOUS | Status: DC | PRN
  Filled 2019-04-07: qty 5

## 2019-04-07 MED ORDER — INSULIN ASPART 100 UNIT/ML ~~LOC~~ SOLN
0.0000 [IU] | Freq: Three times a day (TID) | SUBCUTANEOUS | Status: DC
  Administered 2019-04-07: 3 [IU] via SUBCUTANEOUS
  Administered 2019-04-08: 5 [IU] via SUBCUTANEOUS
  Administered 2019-04-08: 3 [IU] via SUBCUTANEOUS

## 2019-04-07 MED ORDER — ARTHREX ANGEL - ACD-A SOLUTION (CHARTING ONLY) OPTIME
TOPICAL | Status: DC | PRN
  Administered 2019-04-07: 10 mL via TOPICAL

## 2019-04-07 MED ORDER — AMIODARONE HCL 200 MG PO TABS
200.0000 mg | ORAL_TABLET | Freq: Every day | ORAL | Status: DC
  Administered 2019-04-08: 200 mg via ORAL
  Filled 2019-04-07: qty 1

## 2019-04-07 MED ORDER — METHOCARBAMOL 500 MG PO TABS
500.0000 mg | ORAL_TABLET | Freq: Four times a day (QID) | ORAL | Status: DC | PRN
  Administered 2019-04-07: 500 mg via ORAL
  Filled 2019-04-07: qty 1

## 2019-04-07 MED ORDER — AMLODIPINE BESYLATE 10 MG PO TABS
10.0000 mg | ORAL_TABLET | Freq: Every day | ORAL | Status: DC
  Administered 2019-04-08: 10 mg via ORAL
  Filled 2019-04-07: qty 2
  Filled 2019-04-07: qty 1

## 2019-04-07 MED ORDER — BUPIVACAINE HCL (PF) 0.25 % IJ SOLN
INTRAMUSCULAR | Status: AC
  Filled 2019-04-07: qty 30

## 2019-04-07 MED ORDER — THROMBIN 5000 UNITS EX SOLR
OROMUCOSAL | Status: DC | PRN
  Administered 2019-04-07: 5 mL via TOPICAL

## 2019-04-07 MED ORDER — HYDROMORPHONE HCL 1 MG/ML IJ SOLN
INTRAMUSCULAR | Status: AC
  Filled 2019-04-07: qty 1

## 2019-04-07 MED ORDER — POTASSIUM CHLORIDE IN NACL 20-0.9 MEQ/L-% IV SOLN
INTRAVENOUS | Status: DC
  Filled 2019-04-07: qty 1000

## 2019-04-07 MED ORDER — LACTATED RINGERS IV SOLN: INTRAVENOUS | Status: DC

## 2019-04-07 MED ORDER — CEFAZOLIN SODIUM-DEXTROSE 2-4 GM/100ML-% IV SOLN
2.0000 g | INTRAVENOUS | Status: AC
  Administered 2019-04-07: 2 g via INTRAVENOUS

## 2019-04-07 MED ORDER — ACETAMINOPHEN 325 MG PO TABS
650.0000 mg | ORAL_TABLET | ORAL | Status: DC | PRN
  Administered 2019-04-07: 650 mg via ORAL
  Filled 2019-04-07: qty 2

## 2019-04-07 MED ORDER — METFORMIN HCL 500 MG PO TABS
1000.0000 mg | ORAL_TABLET | Freq: Two times a day (BID) | ORAL | Status: DC
  Administered 2019-04-07 – 2019-04-08 (×2): 1000 mg via ORAL
  Filled 2019-04-07 (×2): qty 2

## 2019-04-07 MED ORDER — EPHEDRINE 5 MG/ML INJ
INTRAVENOUS | Status: AC
  Filled 2019-04-07: qty 10

## 2019-04-07 MED ORDER — DEXAMETHASONE SODIUM PHOSPHATE 10 MG/ML IJ SOLN
INTRAMUSCULAR | Status: DC | PRN
  Administered 2019-04-07: 4 mg via INTRAVENOUS

## 2019-04-07 MED ORDER — PHENOL 1.4 % MT LIQD: 1.0000 | OROMUCOSAL | Status: DC | PRN

## 2019-04-07 MED ORDER — ONDANSETRON HCL 4 MG/2ML IJ SOLN
INTRAMUSCULAR | Status: DC | PRN
  Administered 2019-04-07: 4 mg via INTRAVENOUS

## 2019-04-07 MED ORDER — SODIUM CHLORIDE 0.9% FLUSH: 3.0000 mL | INTRAVENOUS | Status: DC | PRN

## 2019-04-07 MED ORDER — LOSARTAN POTASSIUM 50 MG PO TABS
100.0000 mg | ORAL_TABLET | Freq: Every day | ORAL | Status: DC
  Administered 2019-04-07 – 2019-04-08 (×2): 100 mg via ORAL
  Filled 2019-04-07 (×2): qty 2

## 2019-04-07 MED ORDER — MORPHINE SULFATE (PF) 2 MG/ML IV SOLN: 2.0000 mg | INTRAVENOUS | Status: DC | PRN

## 2019-04-07 MED ORDER — HYDROMORPHONE HCL 1 MG/ML IJ SOLN
0.2500 mg | INTRAMUSCULAR | Status: DC | PRN
  Administered 2019-04-07: 0.5 mg via INTRAVENOUS

## 2019-04-07 MED ORDER — MENTHOL 3 MG MT LOZG: 1.0000 | LOZENGE | OROMUCOSAL | Status: DC | PRN

## 2019-04-07 MED ORDER — GLIPIZIDE 10 MG PO TABS
10.0000 mg | ORAL_TABLET | Freq: Two times a day (BID) | ORAL | Status: DC
  Administered 2019-04-07 – 2019-04-08 (×2): 10 mg via ORAL
  Filled 2019-04-07: qty 1
  Filled 2019-04-07 (×2): qty 2
  Filled 2019-04-07 (×2): qty 1

## 2019-04-07 MED ORDER — CARVEDILOL 25 MG PO TABS
50.0000 mg | ORAL_TABLET | Freq: Two times a day (BID) | ORAL | Status: DC
  Administered 2019-04-07 – 2019-04-08 (×2): 50 mg via ORAL
  Filled 2019-04-07 (×2): qty 2

## 2019-04-07 MED ORDER — HEPARIN SODIUM (PORCINE) 1000 UNIT/ML IJ SOLN
INTRAMUSCULAR | Status: DC | PRN
  Administered 2019-04-07: 10000 [IU]

## 2019-04-07 MED ORDER — CEFAZOLIN SODIUM-DEXTROSE 1-4 GM/50ML-% IV SOLN
1.0000 g | Freq: Three times a day (TID) | INTRAVENOUS | Status: AC
  Administered 2019-04-07 – 2019-04-08 (×2): 1 g via INTRAVENOUS
  Filled 2019-04-07: qty 50

## 2019-04-07 MED ORDER — THROMBIN 20000 UNITS EX SOLR
CUTANEOUS | Status: DC | PRN
  Administered 2019-04-07: 20 mL via TOPICAL

## 2019-04-07 MED ORDER — DEXAMETHASONE SODIUM PHOSPHATE 10 MG/ML IJ SOLN: 10.0000 mg | Freq: Once | INTRAMUSCULAR | Status: DC

## 2019-04-07 MED ORDER — THROMBIN 20000 UNITS EX SOLR
CUTANEOUS | Status: AC
  Filled 2019-04-07: qty 20000

## 2019-04-07 MED ORDER — LACTATED RINGERS IV SOLN: INTRAVENOUS | Status: DC | PRN

## 2019-04-07 MED ORDER — ACETAMINOPHEN 650 MG RE SUPP: 650.0000 mg | RECTAL | Status: DC | PRN

## 2019-04-07 MED ORDER — ONDANSETRON HCL 4 MG PO TABS: 4.0000 mg | ORAL_TABLET | Freq: Four times a day (QID) | ORAL | Status: DC | PRN

## 2019-04-07 MED ORDER — SUGAMMADEX SODIUM 200 MG/2ML IV SOLN
INTRAVENOUS | Status: DC | PRN
  Administered 2019-04-07: 200 mg via INTRAVENOUS
  Administered 2019-04-07: 100 mg via INTRAVENOUS

## 2019-04-07 MED ORDER — PROPOFOL 10 MG/ML IV BOLUS
INTRAVENOUS | Status: DC | PRN
  Administered 2019-04-07: 100 mg via INTRAVENOUS

## 2019-04-07 MED ORDER — CELECOXIB 200 MG PO CAPS
200.0000 mg | ORAL_CAPSULE | Freq: Two times a day (BID) | ORAL | Status: DC
  Administered 2019-04-07 – 2019-04-08 (×2): 200 mg via ORAL
  Filled 2019-04-07 (×2): qty 1

## 2019-04-07 MED ORDER — PHENYLEPHRINE HCL-NACL 10-0.9 MG/250ML-% IV SOLN
INTRAVENOUS | Status: DC | PRN
  Administered 2019-04-07: 20 ug/min via INTRAVENOUS

## 2019-04-07 MED ORDER — ACETAMINOPHEN 500 MG PO TABS
1000.0000 mg | ORAL_TABLET | Freq: Once | ORAL | Status: AC
  Administered 2019-04-07: 1000 mg via ORAL
  Filled 2019-04-07: qty 2

## 2019-04-07 MED ORDER — ROCURONIUM BROMIDE 100 MG/10ML IV SOLN
INTRAVENOUS | Status: DC | PRN
  Administered 2019-04-07: 50 mg via INTRAVENOUS
  Administered 2019-04-07: 20 mg via INTRAVENOUS

## 2019-04-07 MED ORDER — SODIUM CHLORIDE 0.9 % IV SOLN: 250.0000 mL | INTRAVENOUS | Status: DC

## 2019-04-07 MED ORDER — INSULIN REGULAR(HUMAN) IN NACL 100-0.9 UT/100ML-% IV SOLN
INTRAVENOUS | Status: DC | PRN
  Administered 2019-04-07: 2.8 [IU]/h via INTRAVENOUS

## 2019-04-07 SURGICAL SUPPLY — 58 items
BAG DECANTER FOR FLEXI CONT (MISCELLANEOUS) ×2 IMPLANT
BASKET BONE COLLECTION (BASKET) IMPLANT
BENZOIN TINCTURE PRP APPL 2/3 (GAUZE/BANDAGES/DRESSINGS) ×2 IMPLANT
BLADE CLIPPER SURG (BLADE) ×2 IMPLANT
BUR CARBIDE MATCH 3.0 (BURR) ×2 IMPLANT
CANISTER SUCT 3000ML PPV (MISCELLANEOUS) ×2 IMPLANT
CARTRIDGE OIL MAESTRO DRILL (MISCELLANEOUS) IMPLANT
CLSR STERI-STRIP ANTIMIC 1/2X4 (GAUZE/BANDAGES/DRESSINGS) ×2 IMPLANT
CNTNR URN SCR LID CUP LEK RST (MISCELLANEOUS) ×1 IMPLANT
CONT SPEC 4OZ STRL OR WHT (MISCELLANEOUS) ×1
COVER BACK TABLE 60X90IN (DRAPES) ×2 IMPLANT
COVER WAND RF STERILE (DRAPES) ×2 IMPLANT
DERMABOND ADVANCED (GAUZE/BANDAGES/DRESSINGS) ×1
DERMABOND ADVANCED .7 DNX12 (GAUZE/BANDAGES/DRESSINGS) ×1 IMPLANT
DIFFUSER DRILL AIR PNEUMATIC (MISCELLANEOUS) IMPLANT
DRAPE C-ARM 42X72 X-RAY (DRAPES) ×2 IMPLANT
DRAPE C-ARMOR (DRAPES) IMPLANT
DRAPE LAPAROTOMY 100X72X124 (DRAPES) ×2 IMPLANT
DRAPE SURG 17X23 STRL (DRAPES) ×2 IMPLANT
DRSG OPSITE POSTOP 4X8 (GAUZE/BANDAGES/DRESSINGS) ×2 IMPLANT
DURAPREP 26ML APPLICATOR (WOUND CARE) ×2 IMPLANT
ELECT REM PT RETURN 9FT ADLT (ELECTROSURGICAL) ×2
ELECTRODE REM PT RTRN 9FT ADLT (ELECTROSURGICAL) ×1 IMPLANT
EVACUATOR 1/8 PVC DRAIN (DRAIN) ×4 IMPLANT
FIBER BOAT ALLO 100X25X7 10CC (Bone Implant) ×2 IMPLANT
GAUZE 4X4 16PLY RFD (DISPOSABLE) IMPLANT
GLOVE BIO SURGEON STRL SZ7 (GLOVE) ×2 IMPLANT
GLOVE BIO SURGEON STRL SZ8 (GLOVE) ×4 IMPLANT
GLOVE BIOGEL PI IND STRL 7.0 (GLOVE) ×3 IMPLANT
GLOVE BIOGEL PI INDICATOR 7.0 (GLOVE) ×3
GOWN STRL REUS W/ TWL LRG LVL3 (GOWN DISPOSABLE) ×2 IMPLANT
GOWN STRL REUS W/ TWL XL LVL3 (GOWN DISPOSABLE) ×3 IMPLANT
GOWN STRL REUS W/TWL 2XL LVL3 (GOWN DISPOSABLE) IMPLANT
GOWN STRL REUS W/TWL LRG LVL3 (GOWN DISPOSABLE) ×2
GOWN STRL REUS W/TWL XL LVL3 (GOWN DISPOSABLE) ×3
HEMOSTAT POWDER KIT SURGIFOAM (HEMOSTASIS) ×2 IMPLANT
KIT BASIN OR (CUSTOM PROCEDURE TRAY) ×2 IMPLANT
KIT BONE MRW ASP ANGEL CPRP (KITS) ×2 IMPLANT
KIT TURNOVER KIT B (KITS) ×2 IMPLANT
MILL MEDIUM DISP (BLADE) ×2 IMPLANT
NEEDLE HYPO 25X1 1.5 SAFETY (NEEDLE) ×2 IMPLANT
NS IRRIG 1000ML POUR BTL (IV SOLUTION) ×2 IMPLANT
OIL CARTRIDGE MAESTRO DRILL (MISCELLANEOUS)
PACK LAMINECTOMY NEURO (CUSTOM PROCEDURE TRAY) ×2 IMPLANT
PAD ARMBOARD 7.5X6 YLW CONV (MISCELLANEOUS) ×6 IMPLANT
PUTTY DBM ALLOSYNC PURE 10CC (Putty) ×2 IMPLANT
SPONGE LAP 4X18 RFD (DISPOSABLE) IMPLANT
SPONGE SURGIFOAM ABS GEL 100 (HEMOSTASIS) ×2 IMPLANT
STRIP CLOSURE SKIN 1/2X4 (GAUZE/BANDAGES/DRESSINGS) ×4 IMPLANT
SUT VIC AB 0 CT1 18XCR BRD8 (SUTURE) ×2 IMPLANT
SUT VIC AB 0 CT1 8-18 (SUTURE) ×2
SUT VIC AB 2-0 CP2 18 (SUTURE) ×4 IMPLANT
SUT VIC AB 3-0 SH 8-18 (SUTURE) ×6 IMPLANT
SYR CONTROL 10ML LL (SYRINGE) ×2 IMPLANT
TOWEL GREEN STERILE (TOWEL DISPOSABLE) ×2 IMPLANT
TOWEL GREEN STERILE FF (TOWEL DISPOSABLE) ×2 IMPLANT
TRAY FOLEY MTR SLVR 16FR STAT (SET/KITS/TRAYS/PACK) ×2 IMPLANT
WATER STERILE IRR 1000ML POUR (IV SOLUTION) ×2 IMPLANT

## 2019-04-07 NOTE — Anesthesia Procedure Notes (Signed)
Procedure Name: Intubation Date/Time: 04/07/2019 11:57 AM Performed by: Janene Harvey, CRNA Pre-anesthesia Checklist: Patient identified, Emergency Drugs available, Suction available and Patient being monitored Patient Re-evaluated:Patient Re-evaluated prior to induction Oxygen Delivery Method: Circle system utilized Preoxygenation: Pre-oxygenation with 100% oxygen Induction Type: IV induction Ventilation: Mask ventilation without difficulty Laryngoscope Size: Mac and 4 Grade View: Grade II Tube type: Oral Tube size: 7.5 mm Number of attempts: 1 Airway Equipment and Method: Stylet and Oral airway Placement Confirmation: ETT inserted through vocal cords under direct vision,  positive ETCO2 and breath sounds checked- equal and bilateral Secured at: 22 cm Tube secured with: Tape Dental Injury: Teeth and Oropharynx as per pre-operative assessment

## 2019-04-07 NOTE — Anesthesia Procedure Notes (Signed)
Arterial Line Insertion Start/End3/02/2019 10:00 AM, 04/07/2019 10:15 AM Performed by: Griffin Dakin, CRNA, CRNA  Patient location: Pre-op. Preanesthetic checklist: patient identified, IV checked, site marked, risks and benefits discussed, surgical consent, monitors and equipment checked, pre-op evaluation, timeout performed and anesthesia consent Lidocaine 1% used for infiltration Right, radial was placed Catheter size: 20 Fr Hand hygiene performed  and maximum sterile barriers used   Attempts: 1 Procedure performed without using ultrasound guided technique. Following insertion, dressing applied and Biopatch. Post procedure assessment: normal and unchanged  Patient tolerated the procedure well with no immediate complications.

## 2019-04-07 NOTE — Telephone Encounter (Signed)
lmtcb for pt.  

## 2019-04-07 NOTE — Anesthesia Postprocedure Evaluation (Signed)
Anesthesia Post Note  Patient: Alan [name]  Procedure(s) Performed: Removal of segmental fixation Lumbar two-Sacral one, posterior lumbar fusion lumbar five-sacral one (N/A Back)     Patient location during evaluation: PACU Anesthesia Type: General Level of consciousness: awake and alert Pain management: pain level controlled Vital Signs Assessment: post-procedure vital signs reviewed and stable Respiratory status: spontaneous breathing, nonlabored ventilation and respiratory function stable Cardiovascular status: blood pressure returned to baseline and stable Postop Assessment: no apparent nausea or vomiting Anesthetic complications: no    Last Vitals:  Vitals:   04/07/19 1610 04/07/19 1633  BP: 135/78 139/75  Pulse: 63 62  Resp: 14 19  Temp: 36.6 C 37.1 C  SpO2: 96% 99%    Last Pain:  Vitals:   04/07/19 1554  PainSc: 6                  Phylicia Mcgaugh,W. EDMOND

## 2019-04-07 NOTE — Transfer of Care (Signed)
Immediate Anesthesia Transfer of Care Note  Patient: Alan [name]  Procedure(s) Performed: Removal of segmental fixation Lumbar two-Sacral one, posterior lumbar fusion lumbar five-sacral one (N/A Back)  Patient Location: PACU  Anesthesia Type:General  Level of Consciousness: awake and patient cooperative  Airway & Oxygen Therapy: Patient Spontanous Breathing and Patient connected to face mask oxygen  Post-op Assessment: Report given to RN and Post -op Vital signs reviewed and stable  Post vital signs: Reviewed  Last Vitals:  Vitals Value Taken Time  BP 114/51 04/07/19 1411  Temp    Pulse 49 04/07/19 1417  Resp 24 04/07/19 1417  SpO2 89 % 04/07/19 1417  Vitals shown include unvalidated device data.  Last Pain:  Vitals:   04/07/19 0903  PainSc: 2       Patients Stated Pain Goal: 3 (84/69/62 9528)  Complications: No apparent anesthesia complications

## 2019-04-07 NOTE — Op Note (Signed)
04/07/2019  1:50 PM  PATIENT:  Alan [name]  79 y.o. male  PRE-OPERATIVE DIAGNOSIS:  1.  Pseudoarthrosis L5-S1, 2.  Loosening of hardware L5-S1, 3.  Back pain  POST-OPERATIVE DIAGNOSIS:  same  PROCEDURE:  1.  Redo posterior lateral fusion L5-S1 utilizing morselized allograft soaked with a bone marrow aspirate obtained through a separate fascial incision over the left iliac crest, 2.  Removal of segmental instrumentation L2-S1, 3.  Exploration of fusion L5-S1 to confirm pseudoarthrosis  SURGEON:  Sherley Bounds, MD  ASSISTANTS: Glenford Peers, FNP  ANESTHESIA:   General  EBL: 150 ml  Total I/O In: 1000 [I.V.:900; IV Piggyback:100] Out: 550 [Urine:400; Blood:150]  BLOOD ADMINISTERED: none  DRAINS: Medium Hemovac  SPECIMEN:  none  INDICATION FOR PROCEDURE: This patient presented with severe back pain. Imaging showed pseudoarthrosis with loosening of instrumentation at L5-S1. The patient tried conservative measures without relief. Pain was debilitating. Recommended lumbar reexploration with extraction of loosened hardware, duration of fusion and redo posterior lateral fusion L5-S1. Patient understood the risks, benefits, and alternatives and potential outcomes and wished to proceed.  PROCEDURE DETAILS: The patient was taken to the operating room and after induction of adequate generalized endotracheal anesthesia he was rolled into the prone position on chest rolls and all pressure points were padded.  His lumbar region was cleaned and then prepped with DuraPrep and then draped in usual sterile fashion.  10 cc of local anesthesia was injected and the old incision was ellipsed out.  I then carried my dissection down through the paraspinous musculature to expose the instrumentation from L2-S1.  The locking caps were removed and the rods were removed.  The S1 screws were loose within the pedicles.  The L3 L5 and S1 pedicle screws were removed.  The L2 pedicle screws were left in place in case  L1-2 would have to be addressed in the future for adjacent level disease.  I dissected over the left iliac crest in a subfascial plane.  I opened the fascia and expose the iliac crest.  I used a Jamshidi needle and tapped this into position and then withdrew 60 cc of bone marrow aspirate which was then spun down in the [place] device to bone marrow aspirate concentrate.  This was then used on morselized allograft.  The posterior elements at L5-S1 were drilled to decorticate them and then this mixture of bone marrow soaked allograft was used to perform arthrodesis L5-S1 bilaterally.  The wound was copiously irrigated with saline solution containing bacitracin.  Medium Hemovac drain was placed.  We closed the fascia with 0 Vicryl.  Closed the subcutaneous and subcuticular tissue with 2-0 and 3-0 Vicryl.  The skin was closed with Dermabond benzoin and Steri-Strips.  The drapes were removed.  A sterile dressing was applied.  The patient was awakened from general anesthesia and transported to the recovery room in stable condition.  At the end of the procedure all sponge needle and instrument counts were correct.   PLAN OF CARE: Admit to inpatient   PATIENT DISPOSITION:  PACU - hemodynamically stable.   Delay start of Pharmacological VTE agent (>24hrs) due to surgical blood loss or risk of bleeding:  yes

## 2019-04-07 NOTE — H&P (Signed)
Subjective: Patient is a 79 y.o. male admitted for back pain. Onset of symptoms was several months ago, gradually worsening since that time.  The pain is rated severe, and is located at the across the lower back. The pain is described as aching and occurs intermittently. The symptoms have been progressive. Symptoms are exacerbated by exercise. MRI or CT showed pseudoarthrosis with painful hardware L5-S1  Past Medical History:  Diagnosis Date  . Anticoagulant long-term use   . Arthritis   . Arthropathy of left shoulder   . Biceps tendonitis, left   . Bursitis of left shoulder   . Cardiac pacemaker in situ    first insertion 2008;  genertor and new lead change 03-08-2012;   [employer]tronic  . CHB (complete heart block) ([place])    s/p  PPM 2008  . Chronic back pain   . CKD (chronic kidney disease), stage III   . Coronary artery disease cardiologist--- dr croitoru   hx  CABG x2  1990 and re-do 2004;  multiple PCI to [place] to 2004;    cardiac cath 2012  occluded LAD and RCA with patent grafts  . Difficult intubation    " Dr. Sallyanne Kuster said that it was difficulty to get the tube in."  . History of coronary angioplasty    multiple PCI to RCA ,  1990 to 2004  . History of GI bleed followed by GI-- Olevia Perches PA @ [facility] [place]   01/ 2020  upper and lower GI bleed ;  s/p  EGD with cautery of jejunal bleed and blood transfusion's  . History of kidney stones   . Hx of echocardiogram 06/29/2008   EF 45-50%   . Hypertension   . Ischemic cardiomyopathy    previously reported , ef 40-45%;  2010--- ef 45-50%;   last echo 2017 55-60%  . Mixed hyperlipidemia   . Nocturia   . OSA on CPAP    cpap, 12, last sleep study Jan2013, sees Dr. Keturah Barre  . PAF (paroxysmal atrial fibrillation) (HCC)    long hx PAF--- followed by dr croitoru  . Post-polio syndrome    dx polio age 74;   09-30-2018  per pt occasional gait issues and occasion fall  . Presence of permanent cardiac pacemaker    . Pseudoarthrosis of lumbar spine   . Rotator cuff tear, left   . S/P CABG x 2    1990 x2  and 2004  x2  . Sinus node dysfunction ([place])    2015--- sinus node arrest  with intact AV conduction  . Type 2 diabetes mellitus ([place])    followed by pcp    Past Surgical History:  Procedure Laterality Date  . APPENDECTOMY  child  . BLEPHAROPLASTY Bilateral 12/2014   upper eyelid  . CARDIAC PACEMAKER PLACEMENT  2008  . CARDIOVERSION N/A 01/15/2019   Procedure: CARDIOVERSION;  Surgeon: Sanda Klein, MD;  Location: [facility] ENDOSCOPY;  Service: Cardiovascular;  Laterality: N/A;  . CARPAL TUNNEL RELEASE Bilateral 2015;  2016  . CATARACT EXTRACTION W/ INTRAOCULAR LENS  IMPLANT, BILATERAL  2017  . CORONARY ANGIOPLASTY  1990  to 2004   multiple to RCA  . CORONARY ARTERY BYPASS GRAFT  1990    @ [place] (dr chitwood)   seq. LIMA to LAD and Diagonal  . CORONARY ARTERY BYPASS GRAFT  2004     dr Amador Cunas   SVG to  RCA and PDA;  ALSO MAZE  PROCEDURE  . ESOPHAGOGASTRODUODENOSCOPY Left 03/18/2013  Procedure: ESOPHAGOGASTRODUODENOSCOPY (EGD);  Surgeon: Cleotis Nipper, MD;  Location: [facility] ENDOSCOPY;  Service: Endoscopy;  Laterality: Left;  . HARDWARE REMOVAL N/A 05/18/2016   Procedure: Removal of broken hardware Lumbar three-four;  Surgeon: [name], MD;  Location: [place];  Service: Neurosurgery;  Laterality: N/A;  . HERNIA REPAIR    . LEFT HEART CATH AND CORS/GRAFTS ANGIOGRAPHY N/A 12/18/2018   Procedure: LEFT HEART CATH AND CORS/GRAFTS ANGIOGRAPHY;  Surgeon: Wellington Hampshire, MD;  Location: [facility] CV LAB;  Service: Cardiovascular;  Laterality: N/A;  . LEFT HEART CATHETERIZATION WITH CORONARY/GRAFT ANGIOGRAM N/A 12/21/2010   Procedure: LEFT HEART CATHETERIZATION WITH Beatrix Fetters;  Surgeon: Leonie Man, MD;  Location: [facility] CATH LAB;  Service: Cardiovascular;  Laterality: N/A;  . LEG SURGERY Right 2002  approx.   lengthened his leg  . LUMBAR LAMINECTOMY/DECOMPRESSION  MICRODISCECTOMY  01/10/2012   Procedure: LUMBAR LAMINECTOMY/DECOMPRESSION MICRODISCECTOMY 1 LEVEL;  Surgeon: [name], MD;  Location: [place] NEURO ORS;  Service: Neurosurgery;  Laterality: Bilateral;  Lumbar three-four decompression, Posterior lateral fusion lumbar three-four, posterior spinus plate lumbar three-four  . LUMBAR LAMINECTOMY/DECOMPRESSION MICRODISCECTOMY Left 05/18/2016   Procedure: Laminectomy and Foraminotomy - Lumbar two-lumbar three- Lumbar four-lumbar five- left with removal hardware lumbar three-four;  Surgeon: [name], MD;  Location: [place];  Service: Neurosurgery;  Laterality: Left;  . NASAL SEPTUM SURGERY  yrs ago  . PACEMAKER REVISION N/A 03/07/2012   Procedure: PACEMAKER REVISION;  Surgeon: Sanda Klein, MD;  Location: [facility] CATH LAB;  Service: Cardiovascular;  Laterality: N/A;  . POSTERIOR LUMBAR FUSION  02-22-2017   dr Ronnald Ramp  @ [facility]   re-do laminectomy L2-3 and fusion  . POSTERIOR LUMBAR FUSION  03-11-2018   dr Ronnald Ramp @ [facility]   laminectomy L4-5;  fixation L2-S1 and fusion L3-S1  . SHOULDER ARTHROSCOPY Right after 2016, pt unsure year  . SHOULDER ARTHROSCOPY WITH OPEN ROTATOR CUFF REPAIR Right 03/30/2014   Procedure: RIGHT SHOULDER ARTHROSCOPY  OPEN ROTATOR CUFF REPAIR;  Surgeon: Kerin Salen, MD;  Location: [place] [facility];  Service: Orthopedics;  Laterality: Right;  . SHOULDER ARTHROSCOPY WITH SUBACROMIAL DECOMPRESSION, ROTATOR CUFF REPAIR AND BICEP TENDON REPAIR Left 10/01/2018   Procedure: LEFT SHOULDER ARTHROSCOPY, DISTAL CLAVICLE EXCISION,SUBACROMIAL, DECOMPRESSION, RORATOR CUFF REPAIR, BICEPS TENODESIS;  Surgeon: Renette Butters, MD;  Location: [place] [facility];  Service: Orthopedics;  Laterality: Left;  . SHOULDER OPEN ROTATOR CUFF REPAIR Right 03/30/2014   Procedure: ROTATOR CUFF REPAIR SHOULDER OPEN;  Surgeon: Kerin Salen, MD;  Location: [facility];  Service: Orthopedics;  Laterality: Right;  . TEE WITHOUT CARDIOVERSION N/A 01/15/2019   Procedure: TRANSESOPHAGEAL  ECHOCARDIOGRAM (TEE);  Surgeon: Sanda Klein, MD;  Location: [place];  Service: Cardiovascular;  Laterality: N/A;  . TONSILLECTOMY  child  . TOTAL KNEE ARTHROPLASTY Left 2006    Prior to Admission medications   Medication Sig Start Date End Date Taking? Authorizing Provider  amiodarone (PACERONE) 200 MG tablet Take 200 mg by mouth daily.   Yes [provider]  amLODipine (NORVASC) 10 MG tablet Take 10 mg by mouth daily.  04/23/17  Yes [provider]  Ascorbic Acid (VITAMIN C) 1000 MG tablet Take 1,000 mg by mouth daily.   Yes [provider]  Calcium Carb-Cholecalciferol (CALCIUM 600+D3 PO) Take 1 tablet by mouth every evening.   Yes [provider]  carboxymethylcellulose (REFRESH PLUS) 0.5 % SOLN Place 1 drop into both eyes daily as needed (dry/irritated eyes.).    Yes [provider]  carvedilol (COREG) 25 MG tablet Take 2 tablets by mouth twice daily Patient taking differently: Take 50 mg by mouth 2 (two) times daily with a meal.  07/17/18  Yes [name], Mihai, MD  colchicine 0.6 MG tablet Take 0.6-1.2 mg by mouth daily as needed (gout flare ups).  02/25/19  Yes [provider]  diclofenac sodium (VOLTAREN) 1 % GEL Apply 2 g topically daily as needed (pain lower back).  01/23/18  Yes [provider]  Flaxseed, Linseed, (FLAXSEED OIL MAX STR) 1300 MG CAPS Take 1,300 mg by mouth every evening.   Yes [provider]  glipiZIDE (GLUCOTROL) 10 MG tablet Take 10 mg by mouth 2 (two) times daily.    Yes [provider]  hydrochlorothiazide (HYDRODIURIL) 25 MG tablet Take 25 mg by mouth daily.    Yes [provider]  insulin lispro (HUMALOG) 100 UNIT/ML injection Inject 5-6 Units into the skin daily as needed for high blood sugar.    Yes [provider]  JANUMET 50-1000 MG tablet Take 1 tablet by mouth 2 (two) times daily. 02/21/19  Yes [provider]  losartan (COZAAR) 100 MG tablet Take  100 mg by mouth daily.    Yes [provider]  Magnesium 250 MG TABS Take 250 mg by mouth every evening.   Yes [provider]  Multiple Vitamin (MULTIVITAMIN WITH MINERALS) TABS tablet Take 1 tablet by mouth daily.    Yes [provider]  Omega-3 Fatty Acids (FISH OIL) 1200 MG CAPS Take 1,200 mg by mouth daily.   Yes [provider]  potassium chloride 20 MEQ TBCR Take 20 mEq by mouth daily. 12/19/18  Yes [name], Mihai, MD  warfarin (COUMADIN) 5 MG tablet TAKE 1 TABLET BY MOUTH ONCE DAILY OR  AS  DIRECTED  BY  COUMADIN  CLINIC.  FIRST  DOSE  ON  WED  NOV  25 03/12/19  Yes [name], Mihai, MD  fluticasone (FLONASE) 50 MCG/ACT nasal spray Place 2 sprays into both nostrils daily as needed for allergies.  02/11/18 02/11/19  [provider]  furosemide (LASIX) 40 MG tablet Take 1 tablet (40 mg total) by mouth daily. 12/19/18 03/19/19  [name], Dani Gobble, MD   No Known Allergies  Social History   Tobacco Use  . Smoking status: Never Smoker  . Smokeless tobacco: Never Used  Substance Use Topics  . Alcohol use: Not Currently    Family History  Problem Relation Age of Onset  . Other Father        MVA  . Cancer Mother      Review of Systems  Positive ROS: Negative  All other systems have been reviewed and were otherwise negative with the exception of those mentioned in the HPI and as above.  Objective: Vital signs in last 24 hours: Temp:  [98.8 F (37.1 C)] 98.8 F (37.1 C) (03/01 0900) Pulse Rate:  [69] 69 (03/01 0900) Resp:  [20] 20 (03/01 0900) BP: (141)/(64) 141/64 (03/01 0900) SpO2:  [100 %] 100 % (03/01 0900) Weight:  [96.6 kg] 96.6 kg (03/01 0900)  General Appearance: Alert, cooperative, no distress, appears stated age Head: Normocephalic, without obvious abnormality, atraumatic Eyes: PERRL, conjunctiva/corneas clear, EOM's intact    Neck: Supple, symmetrical, trachea midline Back: Symmetric, no curvature, ROM normal, no CVA  tenderness Lungs:  respirations unlabored Heart: Regular rate and rhythm Abdomen: Soft, non-tender Extremities: Extremities normal, atraumatic, no cyanosis or edema Pulses: 2+ and symmetric all extremities Skin: Skin color, texture, turgor normal,  no rashes or lesions  NEUROLOGIC:   Mental status: Alert and oriented x4,  no aphasia, good attention span, fund of knowledge, and memory Motor Exam - grossly normal except for stable old post polio weakness Sensory Exam - grossly normal Reflexes: Hyperreflexic Coordination - grossly normal Gait -not tested Balance - grossly normal Cranial Nerves: I: smell Not tested  II: visual acuity  OS: nl    OD: nl  II: visual fields Full to confrontation  II: pupils Equal, round, reactive to light  III,VII: ptosis None  III,IV,VI: extraocular muscles  Full ROM  V: mastication Normal  V: facial light touch sensation  Normal  V,VII: corneal reflex  Present  VII: facial muscle function - upper  Normal  VII: facial muscle function - lower Normal  VIII: hearing Not tested  IX: soft palate elevation  Normal  IX,X: gag reflex Present  XI: trapezius strength  5/5  XI: sternocleidomastoid strength 5/5  XI: neck flexion strength  5/5  XII: tongue strength  Normal    Data Review Lab Results  Component Value Date   WBC 6.1 04/07/2019   HGB 10.6 (L) 04/07/2019   HCT 33.1 (L) 04/07/2019   MCV 95.4 04/07/2019   PLT 166 04/07/2019   Lab Results  Component Value Date   NA 135 04/07/2019   K 4.0 04/07/2019   CL 103 04/07/2019   CO2 22 04/07/2019   BUN 39 (H) 04/07/2019   CREATININE 1.92 (H) 04/07/2019   GLUCOSE 127 (H) 04/07/2019   Lab Results  Component Value Date   INR 1.1 04/07/2019    Assessment/Plan:  Estimated body mass index is 30.56 kg/m as calculated from the following:   Height as of this encounter: 5\' 10"  (1.778 m).   Weight as of this encounter: 96.6 kg. Patient admitted for removal of painful instrumentation L5-S1 with  redo fusion for pseudoarthrosis. Patient has failed a reasonable attempt at conservative therapy.  I explained the condition and procedure to the patient and answered any questions.  Patient wishes to proceed with procedure as planned. Understands risks/ benefits and typical outcomes of procedure.   [name] 04/07/2019 11:15 AM

## 2019-04-08 ENCOUNTER — Ambulatory Visit (INDEPENDENT_AMBULATORY_CARE_PROVIDER_SITE_OTHER): Payer: Medicare HMO | Admitting: *Deleted

## 2019-04-08 DIAGNOSIS — I442 Atrioventricular block, complete: Secondary | ICD-10-CM | POA: Diagnosis not present

## 2019-04-08 LAB — GLUCOSE, CAPILLARY
Glucose-Capillary: 170 mg/dL — ABNORMAL HIGH (ref 70–99)
Glucose-Capillary: 162 mg/dL — ABNORMAL HIGH (ref 70–99)
Glucose-Capillary: 210 mg/dL — ABNORMAL HIGH (ref 70–99)

## 2019-04-08 MED ORDER — OXYCODONE HCL 5 MG PO TABS: 5.0000 mg | ORAL_TABLET | ORAL | 0 refills | Status: DC | PRN

## 2019-04-08 NOTE — Telephone Encounter (Signed)
LMTCB

## 2019-04-08 NOTE — Evaluation (Signed)
Occupational Therapy Evaluation Patient Details Name: Alan [name] MRN: [id number] DOB: [birth year] Today's Date: 04/08/2019    History of Present Illness Pt is 79 yo male admitted for revision of L5-S1 PLIF due to loosening of hardware and continued pain. PMH: PLIF, CAD, DM, HTN, neuromuscular disorder, polio as a child, sleep apnea, pacer 2014.    Clinical Impression   Pt PTA: Pt reports independence with ADL and bed mobility. Pt currently performing LB dressing with AE provided with minA overall. Spouse will be home to assist. Pt aware of back precautions and very factually relayed precautions. Pt supervisionA for mobility in room. Pt attempting to move recliner and breaking precautions- needed cues to correct posture. Pt with no pain upon OTR leaving room. Spouse in room to enforce precautions. Pt does not require continued OT skilled services. OT signing off.    Follow Up Recommendations  No OT follow up    Equipment Recommendations  None recommended by OT    Recommendations for Other Services       Precautions / Restrictions Precautions Precautions: Back Precaution Booklet Issued: Yes (comment) Precaution Comments: pt familiar with precautions from prior sx Required Braces or Orthoses: Spinal Brace Spinal Brace: Lumbar corset(for comfort) Restrictions Weight Bearing Restrictions: No      Mobility Bed Mobility Overal bed mobility: Needs Assistance Bed Mobility: Supine to Sit     Supine to sit: Supervision;HOB elevated     General bed mobility comments: Pt verbalizing technique to get back in bed from EOB  Transfers Overall transfer level: Needs assistance   Transfers: Sit to/from Stand Sit to Stand: Modified independent (Device/Increase time)              Balance Overall balance assessment: Mild deficits observed, not formally tested                                         ADL either performed or assessed with clinical judgement   ADL  Overall ADL's : At baseline;Modified independent                                       General ADL Comments: Pt able to perform LB dressing with figure 4 technique; AE shown and pt able to use sock aid, reacher and LH shoe horn with minA overall due to decreased R foot dorsiflexion. Pt standing for LB dressing to simumate pulling pants up. Pt denying need to get dressed immediately.      Vision Baseline Vision/History: Wears glasses Wears Glasses: At all times Patient Visual Report: No change from baseline Vision Assessment?: No apparent visual deficits     Perception     Praxis      Pertinent Vitals/Pain Pain Assessment: Faces Faces Pain Scale: Hurts a little bit Pain Location: back incision Pain Descriptors / Indicators: Operative site guarding;Sore Pain Intervention(s): Monitored during session     Hand Dominance Right   Extremity/Trunk Assessment Upper Extremity Assessment Upper Extremity Assessment: Overall WFL for tasks assessed   Lower Extremity Assessment Lower Extremity Assessment: Overall WFL for tasks assessed RLE Deficits / Details: strength >3/5 RLE Sensation: WNL RLE Coordination: WNL LLE Deficits / Details: pt has limited ankle ROM due to polio, stable. 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   Communication Communication Communication: No difficulties   Cognition Arousal/Alertness: Awake/alert Behavior During Therapy: WFL for tasks assessed/performed Overall Cognitive Status: Within Functional Limits for tasks assessed                                     General Comments  Spouse in room    Exercises     Shoulder Instructions      Home Living Family/patient expects to be discharged to::  Private residence Living Arrangements: Spouse/significant other Available Help at Discharge: Family;Available 24 hours/day Type of Home: House Home Access: Stairs to enter [employer] of Steps: 3 Entrance Stairs-Rails: Can reach both Home Layout: One level     Bathroom Shower/Tub: Tub/shower unit;Walk-in Psychologist, prison and probation services: Standard     Home Equipment: Toilet riser;Cane - single point          Prior Functioning/Environment Level of Independence: Independent with assistive device(s)        Comments: used cane as needed        OT Problem List:        OT Treatment/Interventions:      OT Goals(Current goals can be found in the care plan section) Acute Rehab OT Goals Patient Stated Goal: return home OT Goal Formulation: With patient Time For Goal Achievement: 04/22/19 Potential to Achieve Goals: Good  OT Frequency:     Barriers to D/C:            Co-evaluation              AM-PAC OT "6 Clicks" Daily Activity     Outcome Measure Help from another person eating meals?: None Help from another person taking care of personal grooming?: None Help from another person toileting, which includes using toliet, bedpan, or urinal?: None Help from another person bathing (including washing, rinsing, drying)?: A Little Help from another person to put on and taking off regular upper body clothing?: None Help from another person to put on and taking off regular lower body clothing?: None 6 Click Score: 23   End of Session Equipment Utilized During Treatment: Rolling walker;Back brace Nurse Communication: Mobility status  Activity Tolerance: Patient tolerated treatment well Patient left: in chair;with call bell/phone within reach  OT Visit Diagnosis: Muscle weakness (generalized) (M62.81);Pain Pain - part of body: (back)                Time: 3329-5188 OT Time Calculation (min): 33 min Charges:  OT General Charges $OT Visit: 1 Visit OT  Evaluation $OT Eval Moderate Complexity: 1 Mod OT Treatments $Self Care/Home Management : 8-22 mins  Jefferey Pica, OTR/L Acute Rehabilitation Services Pager: [phone] Office: [phone]   Mayan Kloepfer  C 04/08/2019, 2:29 PM

## 2019-04-08 NOTE — Discharge Summary (Signed)
Physician Discharge Summary  Patient ID: Alan [name] MRN: [id number] DOB/AGE: 1940-10-06 79 y.o.  Admit date: 04/07/2019 Discharge date: 04/08/2019  Admission Diagnoses: pseudoarthrosis L5-S1 with broken instrumentation, back pain    Discharge Diagnoses: same   Discharged Condition: good  Hospital Course: The patient was admitted on 04/07/2019 and taken to the operating room where the patient underwent removal of hardware and re-do fusion L5-s1. The patient tolerated the procedure well and was taken to the recovery room and then to the floor in stable condition. The hospital course was routine. There were no complications. The wound remained clean dry and intact. Pt had appropriate back soreness. No complaints of leg pain or new N/T/W. The patient remained afebrile with stable vital signs, and tolerated a regular diet. The patient continued to increase activities, and pain was well controlled with oral pain medications.   Consults: None  Significant Diagnostic Studies:  Results for orders placed or performed during the hospital encounter of 04/07/19  CBC  Result Value Ref Range   WBC 6.1 4.0 - 10.5 K/uL   RBC 3.47 (L) 4.22 - 5.81 MIL/uL   Hemoglobin 10.6 (L) 13.0 - 17.0 g/dL   HCT 33.1 (L) 39.0 - 52.0 %   MCV 95.4 80.0 - 100.0 fL   MCH 30.5 26.0 - 34.0 pg   MCHC 32.0 30.0 - 36.0 g/dL   RDW 13.2 11.5 - 15.5 %   Platelets 166 150 - 400 K/uL   nRBC 0.0 0.0 - 0.2 %  Basic metabolic panel  Result Value Ref Range   Sodium 135 135 - 145 mmol/L   Potassium 4.0 3.5 - 5.1 mmol/L   Chloride 103 98 - 111 mmol/L   CO2 22 22 - 32 mmol/L   Glucose, Bld 127 (H) 70 - 99 mg/dL   BUN 39 (H) 8 - 23 mg/dL   Creatinine, Ser 1.92 (H) 0.61 - 1.24 mg/dL   Calcium 9.0 8.9 - 10.3 mg/dL   GFR calc non Af Amer 33 (L) >60 mL/min   GFR calc Af Amer 38 (L) >60 mL/min   Anion gap 10 5 - 15  Protime-INR  Result Value Ref Range   Prothrombin Time 14.5 11.4 - 15.2 seconds   INR 1.1 0.8 - 1.2  Glucose,  capillary  Result Value Ref Range   Glucose-Capillary 127 (H) 70 - 99 mg/dL  Glucose, capillary  Result Value Ref Range   Glucose-Capillary 123 (H) 70 - 99 mg/dL  Glucose, capillary  Result Value Ref Range   Glucose-Capillary 140 (H) 70 - 99 mg/dL  Glucose, capillary  Result Value Ref Range   Glucose-Capillary 128 (H) 70 - 99 mg/dL   Comment 1 Document in Chart   Glucose, capillary  Result Value Ref Range   Glucose-Capillary 122 (H) 70 - 99 mg/dL   Comment 1 Notify RN    Comment 2 Document in Chart   Glucose, capillary  Result Value Ref Range   Glucose-Capillary 181 (H) 70 - 99 mg/dL   Comment 1 Notify RN    Comment 2 Document in Chart   Glucose, capillary  Result Value Ref Range   Glucose-Capillary 288 (H) 70 - 99 mg/dL   Comment 1 Notify RN    Comment 2 Document in Chart   Glucose, capillary  Result Value Ref Range   Glucose-Capillary 162 (H) 70 - 99 mg/dL   Comment 1 Notify RN    Comment 2 Document in Chart   Type and screen [facility]  Result Value Ref Range   ABO/RH(D) A POS    Antibody Screen NEG    Sample Expiration      04/10/2019,2359 Performed at [facility], [place] [address], [place], [place] [address]     Chest 2 View  Result Date: 03/13/2019 CLINICAL DATA:  Preoperative assessment for lumbar spine surgery, pacemaker, coronary artery disease EXAM: CHEST - 2 VIEW COMPARISON:  03/05/2018 FINDINGS: Frontal and lateral views of the chest demonstrates stable enlargement of the cardiac silhouette. Multi lead pacemaker again noted and unchanged. No airspace disease, effusion, or pneumothorax. Lumbar fusion hardware is identified. Multilevel thoracic spondylosis. IMPRESSION: 1. Stable chest, no acute process. Electronically Signed   By: Randa Ngo M.D.   On: 03/13/2019 16:15    Antibiotics:  Anti-infectives (From admission, onward)   Start     Dose/Rate Route Frequency Ordered Stop   04/07/19 2000  ceFAZolin (ANCEF) IVPB 1 g/50 mL  premix     1 g 100 mL/hr over 30 Minutes Intravenous Every 8 hours 04/07/19 1638 04/08/19 0320   04/07/19 1326  bacitracin 50,000 Units in sodium chloride 0.9 % 500 mL irrigation  Status:  Discontinued       As needed 04/07/19 1326 04/07/19 1401   04/07/19 0858  ceFAZolin (ANCEF) 2-4 GM/100ML-% IVPB    Note to Pharmacy: Merryl Hacker   : cabinet override      04/07/19 0858 04/07/19 1203   04/07/19 0845  ceFAZolin (ANCEF) IVPB 2g/100 mL premix     2 g 200 mL/hr over 30 Minutes Intravenous On call to O.R. 04/07/19 0844 04/07/19 1218      Discharge Exam: Blood pressure (!) 116/56, pulse 60, temperature 98.3 F (36.8 C), temperature source Oral, resp. rate 16, height 5\' 10"  (1.778 m), weight 96.6 kg, SpO2 100 %. 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   amLODipine 10 MG tablet Commonly known as: NORVASC Take 10 mg by mouth daily.   CALCIUM 600+D3 PO Take 1 tablet by mouth every evening.   carboxymethylcellulose 0.5 % Soln Commonly known as: REFRESH PLUS Place 1 drop into both eyes daily as needed (dry/irritated eyes.).   carvedilol 25 MG tablet Commonly known as: COREG Take 2 tablets by mouth twice daily What changed: when to take this   colchicine 0.6 MG tablet Take 0.6-1.2 mg by mouth daily as needed (gout flare ups).   diclofenac sodium 1 % Gel Commonly known as: VOLTAREN Apply 2 g topically daily as needed (pain lower back).   Fish Oil 1200 MG Caps Take 1,200 mg by mouth daily.   Flaxseed Oil Max Str 1300 MG Caps Take 1,300 mg by mouth every evening.   fluticasone 50 MCG/ACT nasal spray Commonly known as: FLONASE Place 2 sprays into both nostrils daily as needed for allergies.   furosemide 40 MG tablet Commonly known as:  LASIX Take 1 tablet (40 mg total) by mouth daily.   glipiZIDE 10 MG tablet Commonly known as: GLUCOTROL Take 10 mg by mouth 2 (two) times daily.   hydrochlorothiazide 25 MG tablet Commonly known as: HYDRODIURIL Take 25 mg by mouth daily.   insulin lispro 100 UNIT/ML injection Commonly known as: HUMALOG Inject 2-8 Units into the skin as needed  for high blood sugar.   Janumet 50-1000 MG tablet Generic drug: sitaGLIPtin-metformin Take 1 tablet by mouth 2 (two) times daily.   losartan 100 MG tablet Commonly known as: COZAAR Take 100 mg by mouth daily.   Magnesium 250 MG Tabs Take 250 mg by mouth every evening.   metFORMIN 500 MG 24 hr tablet Commonly known as: GLUCOPHAGE-XR Take 500 mg by mouth 2 (two) times daily. Takes with 500mg  regular tablet   metFORMIN 500 MG tablet Commonly known as: GLUCOPHAGE Take 500 mg by mouth 2 (two) times daily with a meal. Takes with 500mg  XR   multivitamin with minerals Tabs tablet Take 1 tablet by mouth daily.   oxyCODONE 5 MG immediate release tablet Commonly known as: Oxy IR/ROXICODONE Take 1 tablet (5 mg total) by mouth every 4 (four) hours as needed for severe pain ((score 4 to 6)).   Potassium Chloride ER 20 MEQ Tbcr Take 20 mEq by mouth daily.   vitamin C 1000 MG tablet Take 1,000 mg by mouth daily.   warfarin 5 MG tablet Commonly known as: COUMADIN Take as directed. If you are unsure how to take this medication, talk to your nurse or doctor. Original instructions: TAKE 1 TABLET BY MOUTH ONCE DAILY OR  AS  DIRECTED  BY  COUMADIN  CLINIC.  FIRST  DOSE  ON  WED  NOV  25 What changed: See the new instructions.       Disposition: home   Final Dx: re-do fusion L5-S1 with removal of broken hardware  Discharge Instructions    Call MD for:  difficulty breathing, headache or visual disturbances   Complete by: As directed    Call MD for:  persistant nausea and vomiting   Complete by: As directed    Call MD for:  redness,  tenderness, or signs of infection (pain, swelling, redness, odor or green/yellow discharge around incision site)   Complete by: As directed    Call MD for:  severe uncontrolled pain   Complete by: As directed    Call MD for:  temperature >100.4   Complete by: As directed    Diet - low sodium heart healthy   Complete by: As directed    Increase activity slowly   Complete by: As directed    Remove dressing in 48 hours   Complete by: As directed       Follow-up Information    [name], MD. Schedule an appointment as soon as possible for a visit in 2 week(s).   Specialty: Neurosurgery Contact information: [address] [place] [address] [phone]            Signed: [name] 04/08/2019, 8:08 AM

## 2019-04-08 NOTE — Evaluation (Signed)
Physical Therapy Evaluation Patient Details Name: Alan [name] MRN: [id number] DOB: [birth year] Today's Date: 04/08/2019   History of Present Illness  Pt is 79 yo male admitted for revision of L5-S1 PLIF due to loosening of hardware and continued pain. PMH: PLIF, CAD, DM, HTN, neuromuscular disorder, polio as a child, sleep apnea, pacer 2014.   Clinical Impression  Patient evaluated by Physical Therapy with no further acute PT needs identified. All education has been completed and the patient has no further questions. Pt familiar with back precautions and able to verbalize, talked through different home activities and how precautions apply. Also educated on proper core activation and posture during functional mobility. Pt ambulated 250' and ascended 3 stairs safely without physical assist. Able to ambulate with and without SPC. Advised to use [facility] for pain control as needed.  See below for any follow-up Physical Therapy or equipment needs. PT is signing off. Thank you for this referral.     Follow Up Recommendations No PT follow up    Equipment Recommendations  None recommended by PT    Recommendations for Other Services       Precautions / Restrictions Precautions Precautions: Back Precaution Booklet Issued: Yes (comment) Precaution Comments: pt familiar with precautions from prior sx Required Braces or Orthoses: Spinal Brace Spinal Brace: Lumbar corset(for comfort) Restrictions Weight Bearing Restrictions: No      Mobility  Bed Mobility               General bed mobility comments: pt sitting EOB but verbalized technique  Transfers Overall transfer level: Needs assistance   Transfers: Sit to/from Stand Sit to Stand: Modified independent (Device/Increase time)            Ambulation/Gait Ambulation/Gait assistance: Modified independent (Device/Increase time) Gait Distance (Feet): 250 Feet Assistive device: None;Straight cane Gait Pattern/deviations: Step-through  pattern;Antalgic Gait velocity: decreased Gait velocity interpretation: 1.31 - 2.62 ft/sec, indicative of limited community ambulator General Gait Details: vc's for core activation during gait. Able to ambulate with and without SPC, advised to use SPC for pain control as needed and to be mindful to avoid twisting   Stairs Stairs: Yes Stairs assistance: Supervision Stair Management: One rail Left;Step to pattern;Forwards Number of Stairs: 3 General stair comments: pt leads up with RLE, down with LLE due to limited L ankle ROM, no change in this since sx  Wheelchair Mobility    Modified Rankin (Stroke Patients Only)       Balance Overall balance assessment: Mild deficits observed, not formally tested                                           Pertinent Vitals/Pain Pain Assessment: Faces Faces Pain Scale: Hurts a little bit Pain Location: back incision Pain Descriptors / Indicators: Operative site guarding;Sore Pain Intervention(s): Limited activity within patient's tolerance;Monitored during session    [place] expects to be discharged to:: Private residence Living Arrangements: Spouse/significant other Available Help at Discharge: Family;Available 24 hours/day Type of Home: House Home Access: Stairs to enter Entrance Stairs-Rails: Can reach both Entrance Stairs-Number of Steps: 3 Home Layout: One level Home Equipment: Toilet riser;Cane - single point      Prior Function Level of Independence: Independent with assistive device(s)         Comments: used cane as needed     Hand Dominance   Dominant Hand: Right  Extremity/Trunk Assessment   Upper Extremity Assessment Upper Extremity Assessment: Defer to OT evaluation    Lower Extremity Assessment Lower Extremity Assessment: RLE deficits/detail RLE Deficits / Details: strength >3/5 RLE Sensation: WNL RLE Coordination: WNL LLE Deficits / Details: pt has limited ankle ROM  due to polio, stable. 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  Communication   Communication: No difficulties  Cognition Arousal/Alertness: Awake/alert Behavior During Therapy: WFL for tasks assessed/performed Overall Cognitive Status: Within Functional Limits for tasks assessed                                        General Comments General comments (skin integrity, edema, etc.): discussed activity level upon return home as well as posture and seating choices    Exercises     Assessment/Plan    PT Assessment Patent does not need any further PT services  PT Problem List         PT Treatment Interventions      PT Goals (Current goals can be found in the Care Plan section)  Acute Rehab PT Goals Patient Stated Goal: return home PT Goal Formulation: All assessment and education complete, DC therapy    Frequency     Barriers to discharge        Co-evaluation               AM-PAC PT "6 Clicks" Mobility  Outcome Measure Help needed turning from your back to your side while in a flat bed without using bedrails?: None Help needed moving from lying on your back to sitting on the side of a flat bed without using bedrails?: None Help needed moving to and from a bed to a chair (including a wheelchair)?: None Help needed standing up from a chair using your arms (e.g., wheelchair or bedside chair)?: None Help needed to walk in hospital room?: None Help needed climbing 3-5 steps with a railing? : None 6 Click Score: 24    End of Session Equipment Utilized During Treatment: Back brace Activity Tolerance: Patient tolerated treatment well Patient left: in bed;with call bell/phone within reach;with family/visitor present Nurse  Communication: Mobility status PT Visit Diagnosis: Unsteadiness on feet (R26.81);Pain Pain - part of body: (back)    Time: 9678-9381 PT Time Calculation (min) (ACUTE ONLY): 36 min   Charges:   PT Evaluation $PT Eval Low Complexity: 1 Low PT Treatments $Gait Training: 8-22 mins        [place]  Pager [phone] Office [place] 04/08/2019, 1:14 PM

## 2019-04-08 NOTE — Plan of Care (Signed)
Patient alert and oriented, mae's well, voiding adequate amount of urine, swallowing without difficulty, no c/o pain at time of discharge. Patient discharged home with family. Script and discharged instructions given to patient. Patient and family stated understanding of instructions given. Patient has an appointment with Dr. Jones °

## 2019-04-09 LAB — CUP PACEART REMOTE DEVICE CHECK
Battery Impedance: 427 Ohm
Battery Remaining Longevity: 91 mo
Battery Voltage: 2.79 V
Brady Statistic AP VP Percent: 5 %
Brady Statistic AP VS Percent: 94 %
Brady Statistic AS VP Percent: 0 %
Brady Statistic AS VS Percent: 0 %
Date Time Interrogation Session: 20210303080052
Implantable Lead Implant Date: 20080208
Implantable Lead Implant Date: 20140130
Implantable Lead Location: 753859
Implantable Lead Location: 753860
Implantable Lead Model: 5076
Implantable Lead Model: 5076
Implantable Pulse Generator Implant Date: 20140130
Lead Channel Impedance Value: 419 Ohm
Lead Channel Impedance Value: 458 Ohm
Lead Channel Pacing Threshold Amplitude: 0.75 V
Lead Channel Pacing Threshold Amplitude: 0.75 V
Lead Channel Pacing Threshold Pulse Width: 0.4 ms
Lead Channel Pacing Threshold Pulse Width: 0.4 ms
Lead Channel Setting Pacing Amplitude: 2 V
Lead Channel Setting Pacing Amplitude: 2.5 V
Lead Channel Setting Pacing Pulse Width: 0.4 ms
Lead Channel Setting Sensing Sensitivity: 2.8 mV

## 2019-04-09 NOTE — Telephone Encounter (Signed)
We have attempted to contact pt several times with no success or call back from pt. Per triage protocol, message will be closed.  

## 2019-04-09 NOTE — Progress Notes (Signed)
PPM Remote  

## 2019-04-20 ENCOUNTER — Encounter: Payer: Self-pay | Admitting: Internal Medicine

## 2019-05-01 ENCOUNTER — Telehealth: Payer: Self-pay

## 2019-05-01 NOTE — Telephone Encounter (Signed)
lmom for overdue inr 

## 2019-05-21 ENCOUNTER — Telehealth: Payer: Self-pay

## 2019-05-21 NOTE — Telephone Encounter (Signed)
Called and lmomed the pt that they are overdue inr

## 2019-07-03 ENCOUNTER — Telehealth: Payer: Self-pay

## 2019-07-03 NOTE — Telephone Encounter (Signed)
lmom for overdue inr 

## 2019-07-04 ENCOUNTER — Ambulatory Visit: Payer: Self-pay | Admitting: Pharmacist

## 2019-07-08 ENCOUNTER — Ambulatory Visit (INDEPENDENT_AMBULATORY_CARE_PROVIDER_SITE_OTHER): Payer: Medicare HMO | Admitting: *Deleted

## 2019-07-08 DIAGNOSIS — I442 Atrioventricular block, complete: Secondary | ICD-10-CM

## 2019-07-08 LAB — CUP PACEART REMOTE DEVICE CHECK
Battery Impedance: 453 Ohm
Battery Remaining Longevity: 90 mo
Battery Voltage: 2.79 V
Brady Statistic AP VP Percent: 8 %
Brady Statistic AP VS Percent: 92 %
Brady Statistic AS VP Percent: 0 %
Brady Statistic AS VS Percent: 0 %
Date Time Interrogation Session: 20210601105046
Implantable Lead Implant Date: 20080208
Implantable Lead Implant Date: 20140130
Implantable Lead Location: 753859
Implantable Lead Location: 753860
Implantable Lead Model: 5076
Implantable Lead Model: 5076
Implantable Pulse Generator Implant Date: 20140130
Lead Channel Impedance Value: 460 Ohm
Lead Channel Impedance Value: 518 Ohm
Lead Channel Pacing Threshold Amplitude: 0.75 V
Lead Channel Pacing Threshold Amplitude: 0.875 V
Lead Channel Pacing Threshold Pulse Width: 0.4 ms
Lead Channel Pacing Threshold Pulse Width: 0.4 ms
Lead Channel Setting Pacing Amplitude: 2 V
Lead Channel Setting Pacing Amplitude: 2.5 V
Lead Channel Setting Pacing Pulse Width: 0.4 ms
Lead Channel Setting Sensing Sensitivity: 2 mV

## 2019-07-08 NOTE — Progress Notes (Signed)
Remote pacemaker transmission.   

## 2019-07-21 ENCOUNTER — Other Ambulatory Visit: Payer: Self-pay | Admitting: Cardiovascular Disease

## 2019-07-25 ENCOUNTER — Other Ambulatory Visit: Payer: Self-pay

## 2019-10-07 ENCOUNTER — Ambulatory Visit (INDEPENDENT_AMBULATORY_CARE_PROVIDER_SITE_OTHER): Payer: Medicare HMO | Admitting: *Deleted

## 2019-10-07 DIAGNOSIS — I495 Sick sinus syndrome: Secondary | ICD-10-CM | POA: Diagnosis not present

## 2019-10-08 LAB — CUP PACEART REMOTE DEVICE CHECK
Battery Impedance: 477 Ohm
Battery Remaining Longevity: 87 mo
Battery Voltage: 2.79 V
Brady Statistic AP VP Percent: 11 %
Brady Statistic AP VS Percent: 89 %
Brady Statistic AS VP Percent: 0 %
Brady Statistic AS VS Percent: 0 %
Date Time Interrogation Session: 20210901115748
Implantable Lead Implant Date: 20080208
Implantable Lead Implant Date: 20140130
Implantable Lead Location: 753859
Implantable Lead Location: 753860
Implantable Lead Model: 5076
Implantable Lead Model: 5076
Implantable Pulse Generator Implant Date: 20140130
Lead Channel Impedance Value: 441 Ohm
Lead Channel Impedance Value: 498 Ohm
Lead Channel Pacing Threshold Amplitude: 0.75 V
Lead Channel Pacing Threshold Amplitude: 0.875 V
Lead Channel Pacing Threshold Pulse Width: 0.4 ms
Lead Channel Pacing Threshold Pulse Width: 0.4 ms
Lead Channel Setting Pacing Amplitude: 2 V
Lead Channel Setting Pacing Amplitude: 2.5 V
Lead Channel Setting Pacing Pulse Width: 0.4 ms
Lead Channel Setting Sensing Sensitivity: 2 mV

## 2019-10-09 NOTE — Progress Notes (Signed)
Remote pacemaker transmission.   

## 2019-10-26 IMAGING — CT CT PELVIS WITHOUT CONTRAST
2 series · 13 of 32 positions shown, 19 images · non-contrast
Comparison: CT scan of the lumbar spine dated 10/13/2017

CLINICAL DATA: Right hip pain. Chronic low back pain.

EXAM:
CT PELVIS WITHOUT CONTRAST
TECHNIQUE: Multidetector CT imaging of the pelvis was performed following the
standard protocol without intravenous contrast.

[Series 2: soft tissue pelvis without · axial · non-contrast · 0.84mm/px · z∈[-408,-188]mm · 10 of 54 slices shown, 16 images]
[im 5/54  soft-tissue]
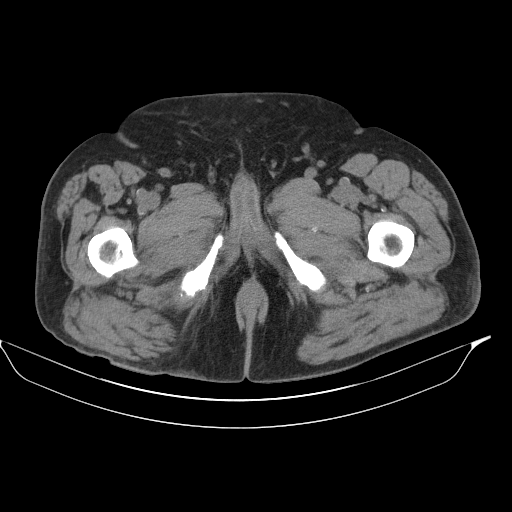
[im 5/54  bone]
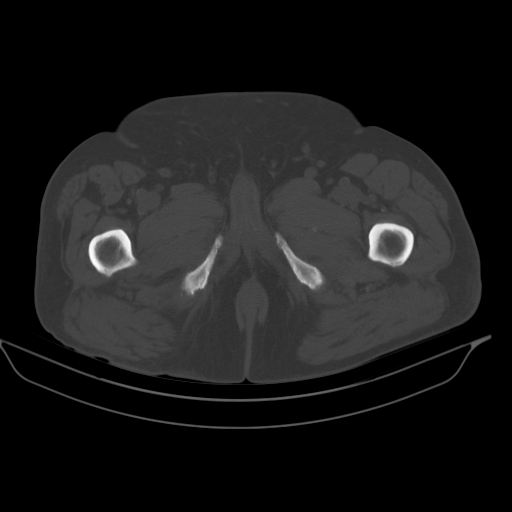
[im 10/54  soft-tissue]
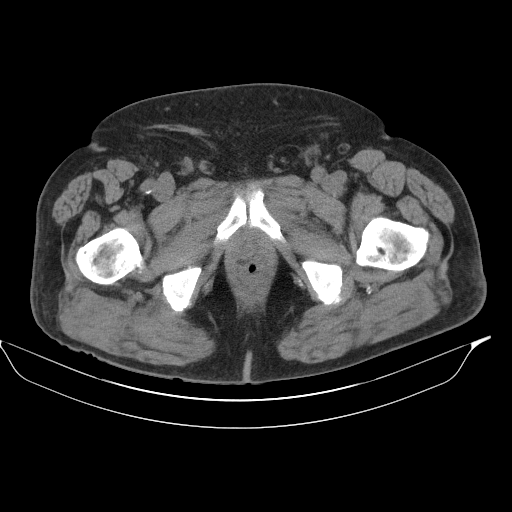
[im 15/54  soft-tissue]
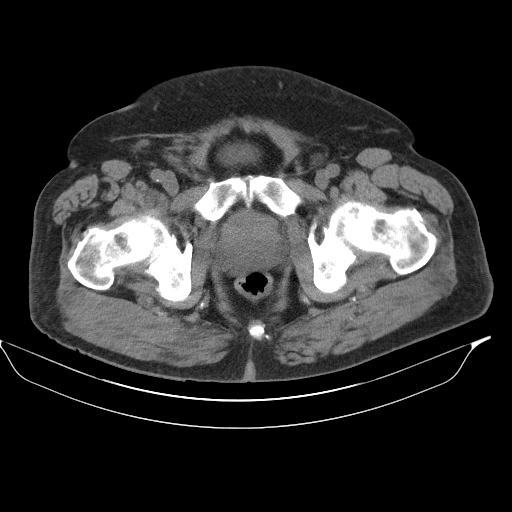
[im 20/54  soft-tissue]
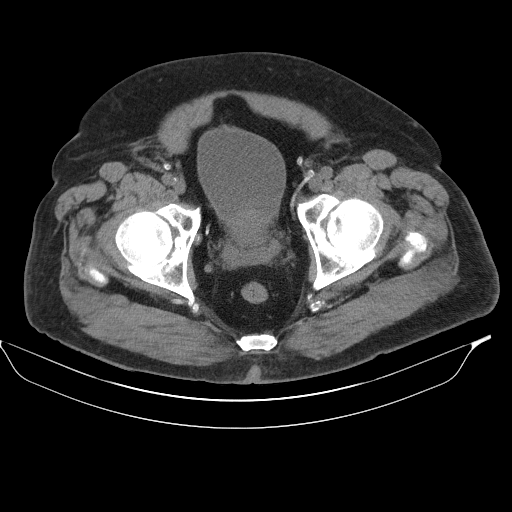
[im 25/54  soft-tissue]
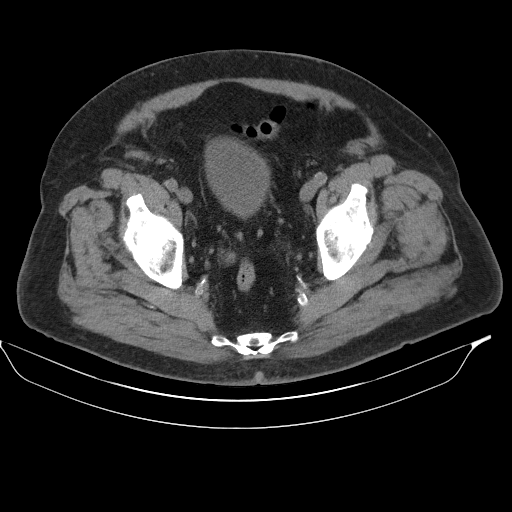
[im 29/54  soft-tissue]
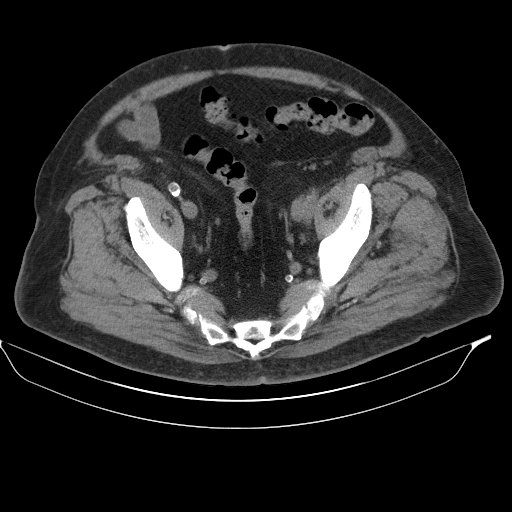
[im 34/54  soft-tissue]
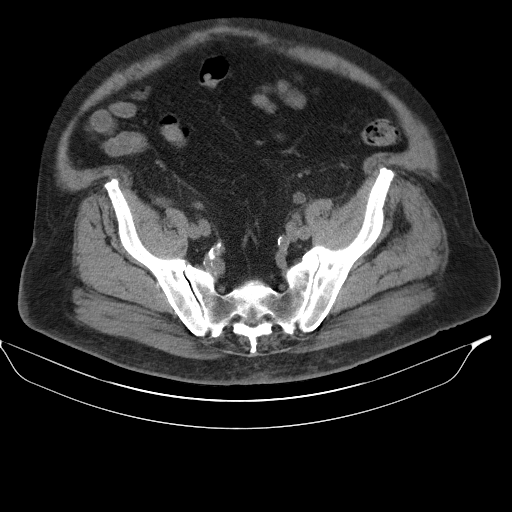
[im 34/54  lung]
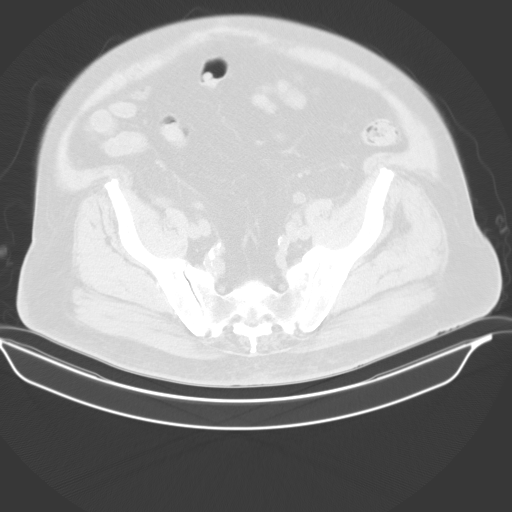
[im 39/54  soft-tissue]
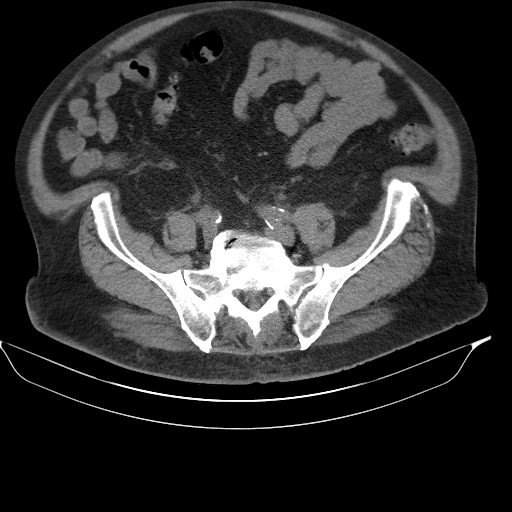
[im 39/54  lung]
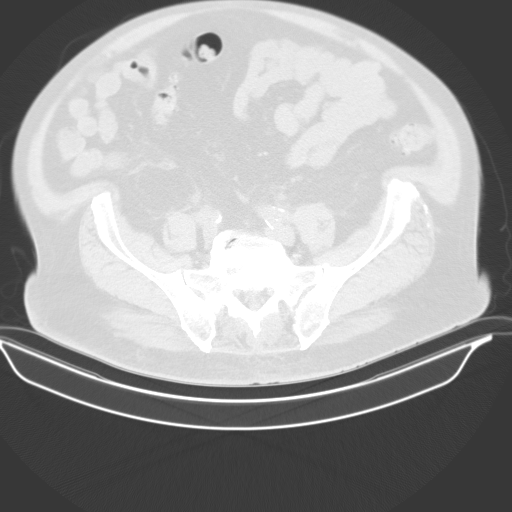
[im 44/54  soft-tissue]
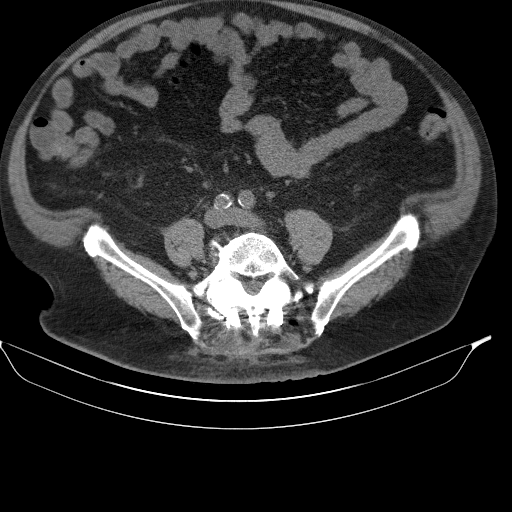
[im 44/54  lung]
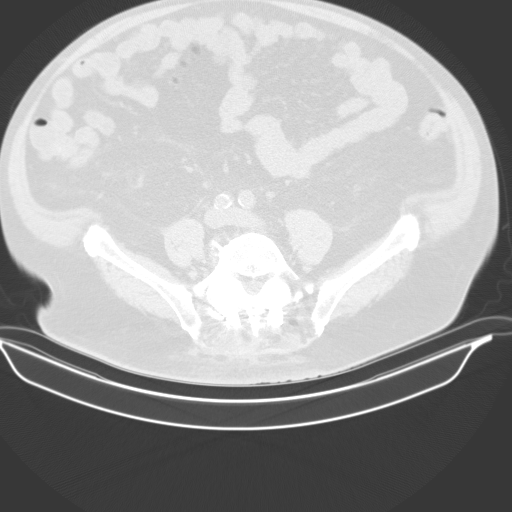
[im 44/54  bone]
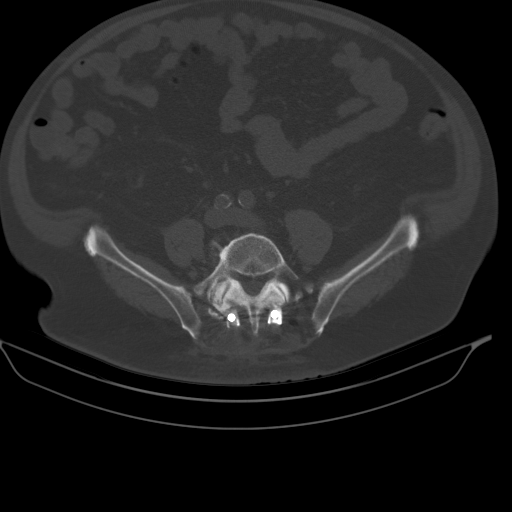
[im 49/54  soft-tissue]
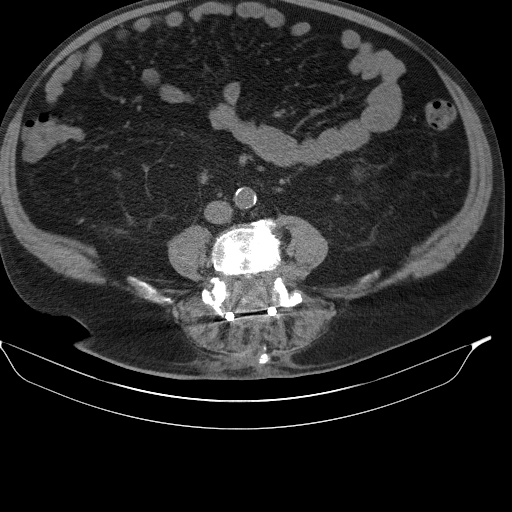
[im 49/54  lung]
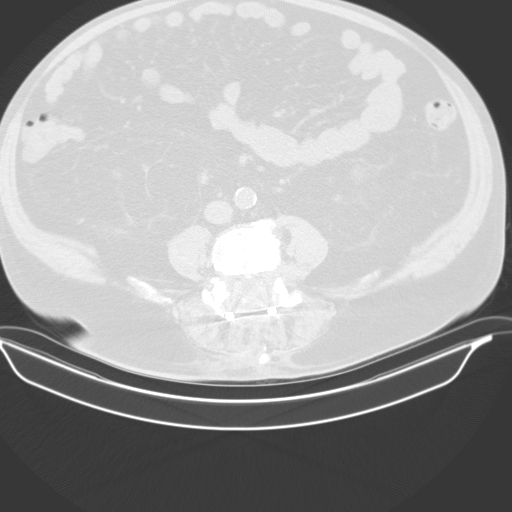

[Series 3: bone · axial · 0.84mm/px · z∈[-403,-346]mm · 3 of 90 slices shown]
[im 10/90  bone]
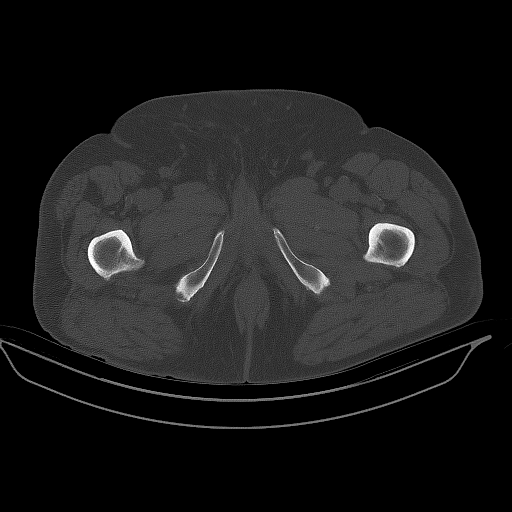
[im 19/90  bone]
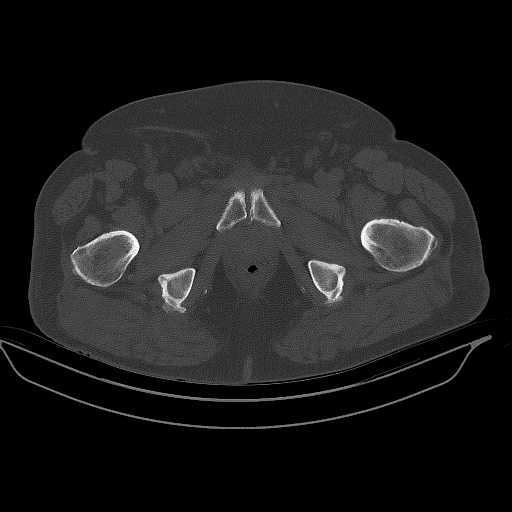
[im 29/90  bone]
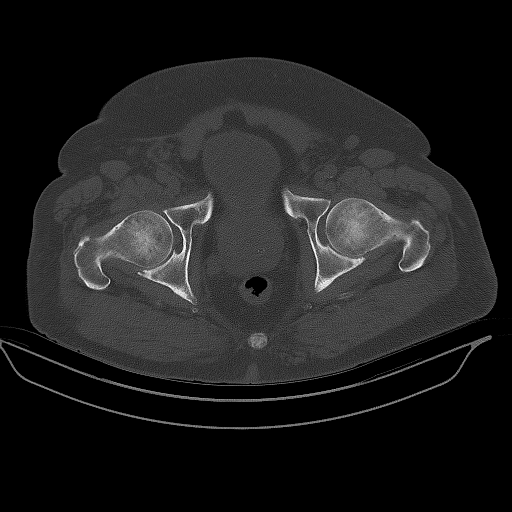

[13 of 32 positions shown; findings below may reference images not displayed]

FINDINGS: Musculoskeletal: There is slight posterior joint space narrowing of
both hips with minimal marginal osteophytes on both acetabuli. The
femoral heads appear essentially normal. No joint effusions. Lower
lumbar fusion. There is some bone resorption around the left pedicle
screw at S1. The right pedicle screw in S1 shows no evidence of
loosening. The pedicle screws in L5 appear normal with no evidence
of loosening. There is solid interbody fusion at L4-5. Pedicle screw
remnants and L4. The visualized portions of the Harrington rods
intact.

There is degenerative disc disease at L4-5. There is a small disc
protrusion as well as osteophytes protruding into the right neural
foramen at L5-S1. This appears unchanged since the prior CT scan of
12/13/2017.

Anterior osteophytes fuse the left sacroiliac joint. Minimal
degenerative changes of the right sacroiliac joint.

Urinary Tract: Slight prominence of the prostate gland. Bladder
appears normal. Distal ureters are normal.

Bowel:  Unremarkable visualized pelvic bowel loops.

Vascular/Lymphatic: Aortic atherosclerosis. No adenopathy.

Reproductive:  Slight prominence of the prostate gland.

Other:  None
IMPRESSION: 1. No acute abnormalities of the pelvis. Slight degenerative changes
of both hips.
2. Solid interbody fusion at L4-5.
3. Slight bone resorption around the left pedicle screw at S1.
4. Stable degenerative disc disease at L5-S1 with a small disc
protrusion as well as osteophytes protruding into the right neural
foramen at L5-S1.

Aortic Atherosclerosis ([id number]).

## 2019-10-26 IMAGING — CT CT LUMBAR SPINE WITHOUT CONTRAST
1 of 7 series · 6 of 14 positions shown, 8 images · non-contrast
Comparison: CT lumbar myelogram 12/13/2017

CLINICAL DATA: Low back pain, unspecified back pain laterality,
unspecified chronicity, unspecified whether sciatica present.
Previous failed fusion with broken hardware. Lumbar spine surgery
03/11/2018 [DATE] extend posterior fixation L2-S1 as well as
decompressive L4-5 laminectomy and fusion.

EXAM:
CT LUMBAR SPINE WITHOUT CONTRAST
TECHNIQUE: Multidetector CT imaging of the lumbar spine was performed without
intravenous contrast administration. Multiplanar CT image
reconstructions were also generated.

[Series 3: l spine soft · axial · 0.37mm/px · z∈[-230,-50]mm · 6 of 86 slices shown, 8 images]
[im 13/86  soft-tissue]
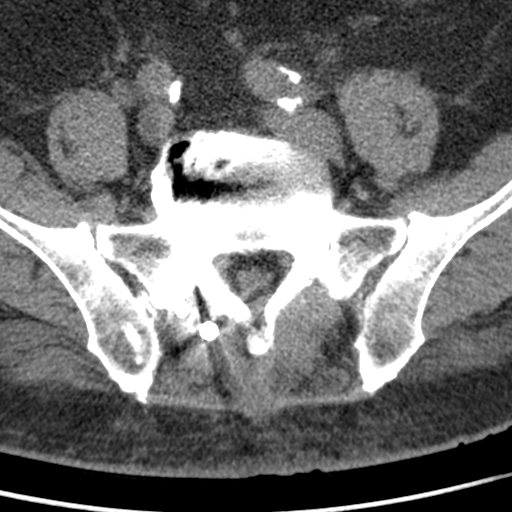
[im 13/86  bone]
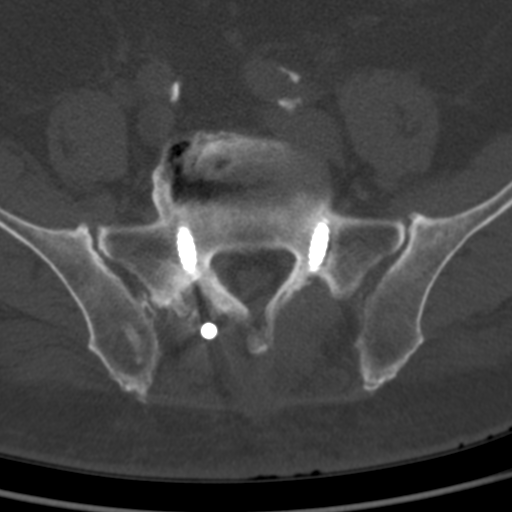
[im 25/86  bone]
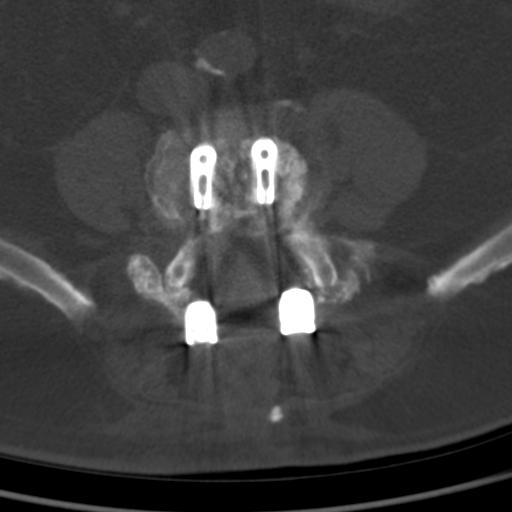
[im 37/86  bone]
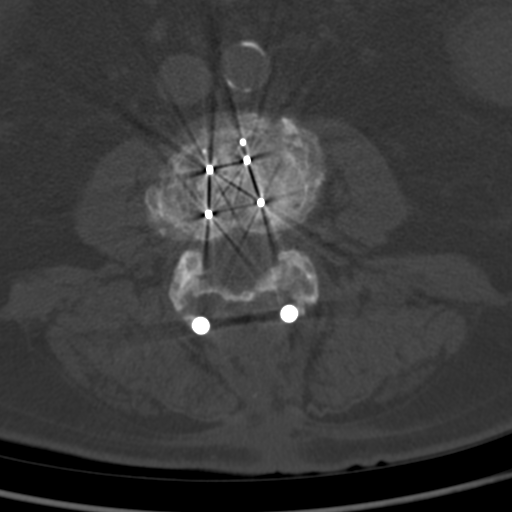
[im 49/86  bone]
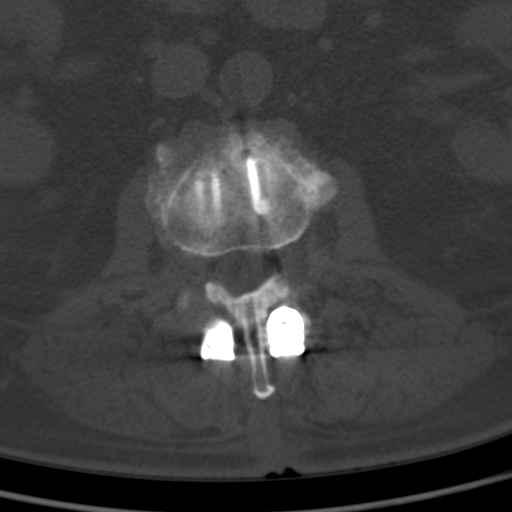
[im 61/86  soft-tissue]
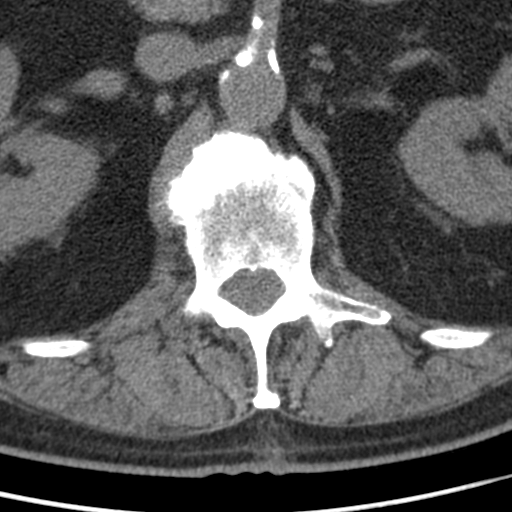
[im 61/86  bone]
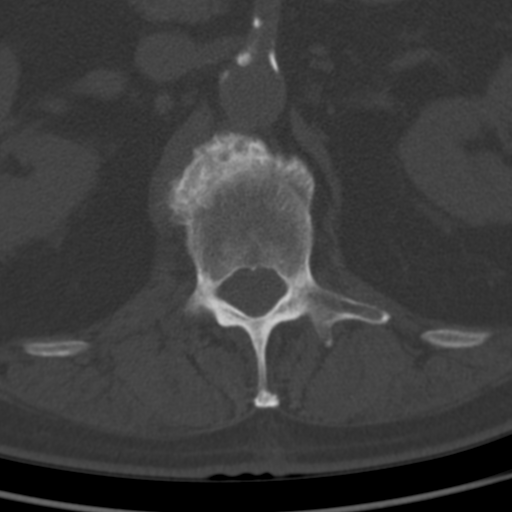
[im 73/86  bone]
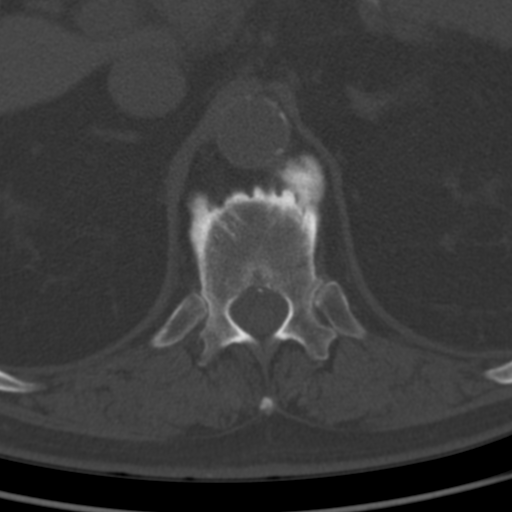

[6 of 14 positions shown; findings below may reference images not displayed]

FINDINGS: Segmentation: 5 non rib-bearing lumbar type vertebral bodies are
present. The lowest fully formed vertebral body is L5.

Alignment: There is some straightening of the normal lumbar
lordosis. No significant listhesis is present. Mild rightward
curvature is again seen.

Vertebrae: Vertebral body heights are maintained. Inferior endplate
Schmorl's nodes are again noted at L1.

Paraspinal and other soft tissues: Atherosclerotic calcifications
are present in the aorta. No solid organ lesions are present. Dense
calcifications suggest possible stenosis of the celiac and superior
mesenteric arteries. No significant adenopathy is present.

Disc levels: T12-L1: Anterior osteophytes are fused. No significant
stenosis is present. Mild facet hypertrophy is noted bilaterally.

L1-2: A vacuum disc is present. Progressive facet hypertrophy is
noted bilaterally. Far right lateral disc protrusion is again noted.
There is some progression of moderate foraminal narrowing
bilaterally. Mild central canal stenosis has progressed.

L2-3: Laminectomy and fusion is again noted. No residual recurrent
stenosis is present. Fusion is solid.

L3-4: Laminectomy and fusion is present. Bilateral facet hypertrophy
is again noted. Moderate right and mild left foraminal stenosis is
stable.

L4-5: Laminectomy was extended. Central canal is decompressed.
Bilateral foraminotomies noted as well. Previously noted broken
pedicle screws are stable. Disc replacement fusion is noted. There
is solid fusion across the disc space.

L5-S1: Posterior fixation extends across this level. There is no
significant osseous fusion of the posterior elements. Gas is present
in the disc space, new since the prior study. Endplate ridging and
asymmetric right-sided facet hypertrophy contribute to moderate
right subarticular and foraminal stenosis. Mild left foraminal
narrowing is stable.
IMPRESSION: 1. Interval laminectomy and decompression at L4-5 without residual
central or foraminal stenosis.
2. Solid fusion across the disc space at L4-5.
3. Posterior fixation now extends to the S1 level.
4. Vacuum disc on the right at L5-S1 suggests some motion at the
L5-S1 level without osseous fusion across the disc space or
posterior elements.
5. Residual moderate right subarticular and foraminal stenosis at
L5-S1.
6. Moderate right and mild left osseous foraminal narrowing at L3-4
is stable.
7. Slight progression of mild central and moderate bilateral
foraminal stenosis at L1-2.

## 2019-11-12 ENCOUNTER — Other Ambulatory Visit: Payer: Self-pay | Admitting: Neurological Surgery

## 2019-11-12 DIAGNOSIS — M461 Sacroiliitis, not elsewhere classified: Secondary | ICD-10-CM

## 2019-11-25 ENCOUNTER — Other Ambulatory Visit: Payer: Self-pay | Admitting: Cardiovascular Disease

## 2019-11-25 NOTE — Telephone Encounter (Signed)
Rx has been sent to the pharmacy electronically. ° °

## 2019-12-04 ENCOUNTER — Other Ambulatory Visit: Payer: Self-pay

## 2019-12-04 ENCOUNTER — Ambulatory Visit
Admission: RE | Admit: 2019-12-04 | Discharge: 2019-12-04 | Disposition: A | Discharge status: Home / Self Care | Payer: Medicare HMO | Source: Ambulatory Visit | Attending: Neurological Surgery | Admitting: Neurological Surgery

## 2019-12-04 DIAGNOSIS — M461 Sacroiliitis, not elsewhere classified: Secondary | ICD-10-CM

## 2019-12-05 ENCOUNTER — Telehealth: Payer: Self-pay | Admitting: Internal Medicine

## 2019-12-09 ENCOUNTER — Ambulatory Visit: Payer: Medicare HMO | Admitting: Dermatology

## 2019-12-09 ENCOUNTER — Encounter: Payer: Self-pay | Admitting: Dermatology

## 2019-12-09 ENCOUNTER — Other Ambulatory Visit: Payer: Self-pay

## 2019-12-09 DIAGNOSIS — L570 Actinic keratosis: Secondary | ICD-10-CM | POA: Diagnosis not present

## 2019-12-09 DIAGNOSIS — D0462 Carcinoma in situ of skin of left upper limb, including shoulder: Secondary | ICD-10-CM

## 2019-12-09 DIAGNOSIS — Z85828 Personal history of other malignant neoplasm of skin: Secondary | ICD-10-CM | POA: Diagnosis not present

## 2019-12-09 DIAGNOSIS — D0439 Carcinoma in situ of skin of other parts of face: Secondary | ICD-10-CM | POA: Diagnosis not present

## 2019-12-09 DIAGNOSIS — D0421 Carcinoma in situ of skin of right ear and external auricular canal: Secondary | ICD-10-CM | POA: Diagnosis not present

## 2019-12-09 DIAGNOSIS — Z1283 Encounter for screening for malignant neoplasm of skin: Secondary | ICD-10-CM

## 2019-12-09 DIAGNOSIS — D485 Neoplasm of uncertain behavior of skin: Secondary | ICD-10-CM

## 2019-12-09 NOTE — Patient Instructions (Signed)

## 2019-12-11 NOTE — Telephone Encounter (Signed)
Spoke with Patient.  Patient scheduled 12/12/19 at 1415 for high dose flu.

## 2019-12-12 ENCOUNTER — Other Ambulatory Visit: Payer: Self-pay

## 2019-12-12 ENCOUNTER — Ambulatory Visit (INDEPENDENT_AMBULATORY_CARE_PROVIDER_SITE_OTHER): Payer: Medicare HMO

## 2019-12-12 DIAGNOSIS — Z23 Encounter for immunization: Secondary | ICD-10-CM

## 2019-12-16 ENCOUNTER — Encounter: Payer: Self-pay | Admitting: Dermatology

## 2019-12-16 NOTE — Progress Notes (Signed)
   Follow-Up Visit   Subjective  Alan [name] is a 79 y.o. male who presents for the following: Follow-up (PATIENT HAS LIST TO GO OVER).  General skin check Location: Several new crusts right ear, cheek, left arm. Duration:  Quality:  Associated Signs/Symptoms: Modifying Factors:  Severity:  Timing: Context:   Objective  Well appearing patient in no apparent distress; mood and affect are within normal limits.  A full examination was performed including scalp, head, eyes, ears, nose, lips, neck, chest, axillae, abdomen, back, buttocks, bilateral upper extremities, bilateral lower extremities, hands, feet, fingers, toes, fingernails, and toenails. All findings within normal limits unless otherwise noted below.   Assessment & Plan    Screening exam for skin cancer Chest - Medial [facility])  Annual skin examination  Neoplasm of uncertain behavior of skin (3) Right UPPER EAR  Skin / nail biopsy Type of biopsy: tangential   Informed consent: discussed and consent obtained   Timeout: patient name, date of birth, surgical site, and procedure verified   Anesthesia: the lesion was anesthetized in a standard fashion   Anesthetic:  1% lidocaine w/ epinephrine 1-100,000 local infiltration Instrument used: flexible razor blade   Hemostasis achieved with: ferric subsulfate   Outcome: patient tolerated procedure well   Post-procedure details: sterile dressing applied and wound care instructions given   Dressing type: bandage and petrolatum    Specimen 1 - Surgical pathology Differential Diagnosis: BCC SCC Check Margins: No  Right CHEEK  Skin / nail biopsy Type of biopsy: tangential   Informed consent: discussed and consent obtained   Timeout: patient name, date of birth, surgical site, and procedure verified   Anesthesia: the lesion was anesthetized in a standard fashion   Anesthetic:  1% lidocaine w/ epinephrine 1-100,000 local infiltration Instrument used: flexible razor  blade   Hemostasis achieved with: ferric subsulfate   Outcome: patient tolerated procedure well   Post-procedure details: sterile dressing applied and wound care instructions given   Dressing type: bandage and petrolatum    Specimen 2 - Surgical pathology Differential Diagnosis: BCC SCC Check Margins: No  Left Forearm - Posterior  Skin / nail biopsy Type of biopsy: tangential   Informed consent: discussed and consent obtained   Timeout: patient name, date of birth, surgical site, and procedure verified   Anesthesia: the lesion was anesthetized in a standard fashion   Anesthetic:  1% lidocaine w/ epinephrine 1-100,000 local infiltration Instrument used: flexible razor blade   Hemostasis achieved with: ferric subsulfate   Outcome: patient tolerated procedure well   Post-procedure details: sterile dressing applied and wound care instructions given   Dressing type: bandage and petrolatum    Specimen 3 - Surgical pathology Differential Diagnosis: BCC SCC Check Margins: No  AK (actinic keratosis) (3) Left Scaphoid Fossa  Destruction of lesion - Left Scaphoid Fossa Complexity: simple   Destruction method: cryotherapy   Informed consent: discussed and consent obtained   Timeout:  patient name, date of birth, surgical site, and procedure verified Lesion destroyed using liquid nitrogen: Yes   Cryotherapy cycles:  5 Outcome: patient tolerated procedure well with no complications   Post-procedure details: wound care instructions given       I, Lavonna Monarch, MD, have reviewed all documentation for this visit.  The documentation on 12/16/19 for the exam, diagnosis, procedures, and orders are all accurate and complete.

## 2019-12-17 ENCOUNTER — Telehealth: Payer: Self-pay | Admitting: *Deleted

## 2019-12-17 NOTE — Telephone Encounter (Signed)
Pathology to patient, surgery appointment scheduled.  

## 2019-12-17 NOTE — Telephone Encounter (Signed)
-----   Message from Lavonna Monarch, MD sent at 12/17/2019  7:36 AM EST ----- Schedule surgery with Dr. Darene Lamer; will decide at that time which ones will be treated

## 2019-12-29 ENCOUNTER — Other Ambulatory Visit: Payer: Self-pay | Admitting: Cardiovascular Disease

## 2020-01-06 ENCOUNTER — Ambulatory Visit (INDEPENDENT_AMBULATORY_CARE_PROVIDER_SITE_OTHER): Payer: Medicare HMO

## 2020-01-06 DIAGNOSIS — I495 Sick sinus syndrome: Secondary | ICD-10-CM

## 2020-01-07 LAB — CUP PACEART REMOTE DEVICE CHECK
Battery Impedance: 552 Ohm
Battery Remaining Longevity: 81 mo
Battery Voltage: 2.79 V
Brady Statistic AP VP Percent: 13 %
Brady Statistic AP VS Percent: 87 %
Brady Statistic AS VP Percent: 0 %
Brady Statistic AS VS Percent: 0 %
Date Time Interrogation Session: 20211201071521
Implantable Lead Implant Date: 20080208
Implantable Lead Implant Date: 20140130
Implantable Lead Location: 753859
Implantable Lead Location: 753860
Implantable Lead Model: 5076
Implantable Lead Model: 5076
Implantable Pulse Generator Implant Date: 20140130
Lead Channel Impedance Value: 436 Ohm
Lead Channel Impedance Value: 464 Ohm
Lead Channel Pacing Threshold Amplitude: 0.75 V
Lead Channel Pacing Threshold Amplitude: 0.875 V
Lead Channel Pacing Threshold Pulse Width: 0.4 ms
Lead Channel Pacing Threshold Pulse Width: 0.4 ms
Lead Channel Setting Pacing Amplitude: 2 V
Lead Channel Setting Pacing Amplitude: 2.5 V
Lead Channel Setting Pacing Pulse Width: 0.4 ms
Lead Channel Setting Sensing Sensitivity: 2 mV

## 2020-01-12 NOTE — Progress Notes (Signed)
Remote pacemaker transmission.   

## 2020-01-25 ENCOUNTER — Other Ambulatory Visit: Payer: Self-pay | Admitting: Cardiovascular Disease

## 2020-02-04 ENCOUNTER — Encounter: Payer: Medicare HMO | Admitting: Dermatology

## 2020-02-08 ENCOUNTER — Other Ambulatory Visit: Payer: Self-pay | Admitting: Cardiovascular Disease

## 2020-02-19 ENCOUNTER — Other Ambulatory Visit: Payer: Self-pay | Admitting: Cardiovascular Disease

## 2020-02-27 ENCOUNTER — Other Ambulatory Visit: Payer: Self-pay | Admitting: Cardiovascular Disease

## 2020-03-09 NOTE — Progress Notes (Signed)
HPI M never smoker followed for OSA, history UPPP. Complicated by Post polio, AFib/ pacemaker, CAD/CABG,  DM 2, HBP, gout,  DDD,      PCP Dr Alan [name] NPSG 05/30/97- AHI 20/ hr, desaturation to 78%, body weight 230 lbs Office Spirometry-02/26/2018- Mild Restriction of exhaled volume.  FVC 2.9/69%, FEV1 2.4/81%, ratio 1.84, FEF 25-75% 3.1/145%. ------------------------------------------------------------------------------  03/12/19- 78 yoM never smoker followed for OSA, history UPPP. Complicated by Post polio, AFib/ pacemaker, CAD/CABG,  DM 2, HBP, gout, PCP Dr Alan [name] NPSG 05/30/97- AHI 20/ hr, desaturation to 78%, body weight 230 lbs CPAP 12/ [facility] -----Surgical clearance PREOP for spine surgery+ Form Also asks HC parking- form completed Body weight today 219 l bs Using CPAP every night. Concerned that either power cord or on-off switch wearing out- needs service. Breathing is comfortable. Denies cough or wheeze. No acute medical concern.  03/10/20- 71 YOM never smoker followed for OSA, history UPPP. Complicated by Post polio, AFib/ pacemaker/ Coumadin, CAD/CABG/ Pacemaker,  DM 2, HBP, gout, PCP Dr Alan [name], Covid infection 02/11/20,  CPAP 12/ [facility]- Uses older machine mostly- Download- Newer machine- brought with him today-no card      AutoSet AirSense 10 Body weight today-228 lbs Covid vax-3 Phizer Flu vax-had He asks to change DME from [employer]. Was very compliant with his older machine. No download available yet with new machine.  Runs out of breath with exertion. No abrupt change. Little cough or wheeze.  CXR 03/13/19-  FINDINGS: Frontal and lateral views of the chest demonstrates stable enlargement of the cardiac silhouette. Multi lead pacemaker again noted and unchanged. No airspace disease, effusion, or pneumothorax. Lumbar fusion hardware is identified. Multilevel thoracic spondylosis. IMPRESSION: 1. Stable chest, no acute process.   ROS-HPI  + = positive Constitutional:   No-   weight  loss, night sweats, fevers, chills, fatigue, lassitude. HEENT:   No-  headaches, difficulty swallowing, tooth/dental problems, sore throat,       No-  sneezing, itching, ear ache, nasal congestion, post nasal drip,  CV:  No-   chest pain, orthopnea, PND, swelling in lower extremities, anasarca, dizziness, palpitations Resp: +shortness of breath with exertion or at rest,  cough            No-   No- coughing up of blood.              No-   change in color of mucus.  No- wheezing.   Skin: No-   rash or lesions. GI:  No-   heartburn, indigestion, abdominal pain, nausea, vomiting,  GU:  MS:  No-   joint pain or swelling.  + back pain Neuro-     nothing unusual Psych:  No- change in mood or affect. No depression or anxiety.  No memory loss.  OBJ General- Alert, Oriented, Affect-appropriate, Distress- none acute, + obese Skin- +bruise on arm Lymphadenopathy- none Head- atraumatic            Eyes- Gross vision intact, PERRLA, conjunctivae clear secretions            Ears- Hearing, canals-normal            Nose- Clear, no-Septal dev, mucus, polyps, erosion, perforation             Throat- Mallampati III/ +sp UPPP , mucosa clear , drainage- none, tonsils- atrophic Neck- flexible , trachea midline, no stridor , thyroid nl, carotid no bruit Chest - symmetrical excursion , unlabored  Heart/CV- RRR , no murmur , no gallop  , no rub, nl s1 s2                           - JVD- none , edema- none, stasis changes- none, varices- none           Lung- +clear/ unlabored, wheeze- none, cough- none , dullness-none, rub- none           Chest wall-  + pacemaker L, + back brace Abd- Br/ Gen/ Rectal- Not done, not indicated Extrem-  Neuro- grossly intact to observation

## 2020-03-10 ENCOUNTER — Ambulatory Visit (INDEPENDENT_AMBULATORY_CARE_PROVIDER_SITE_OTHER): Payer: Medicare HMO | Admitting: Internal Medicine

## 2020-03-10 ENCOUNTER — Other Ambulatory Visit: Payer: Self-pay

## 2020-03-10 ENCOUNTER — Encounter: Payer: Self-pay | Admitting: Internal Medicine

## 2020-03-10 ENCOUNTER — Ambulatory Visit (INDEPENDENT_AMBULATORY_CARE_PROVIDER_SITE_OTHER): Payer: Medicare HMO

## 2020-03-10 VITALS — BP 140/60 | HR 84 | Temp 98.0°F | Ht 70.0 in | Wt 228.0 lb

## 2020-03-10 DIAGNOSIS — R0600 Dyspnea, unspecified: Secondary | ICD-10-CM

## 2020-03-10 DIAGNOSIS — J984 Other disorders of lung: Secondary | ICD-10-CM | POA: Diagnosis not present

## 2020-03-10 DIAGNOSIS — G4733 Obstructive sleep apnea (adult) (pediatric): Secondary | ICD-10-CM | POA: Diagnosis not present

## 2020-03-10 NOTE — Patient Instructions (Addendum)
Order- Patient requests change DME from [employer].  Change to CPAP auto 5-15, mask of choice, humidifier, supplies,  Airview/ card  Order- CXR   Dx Dyspnea   Please call as needed

## 2020-03-24 ENCOUNTER — Other Ambulatory Visit: Payer: Self-pay | Admitting: Neurological Surgery

## 2020-03-24 ENCOUNTER — Other Ambulatory Visit: Payer: Self-pay | Admitting: Cardiovascular Disease

## 2020-03-28 ENCOUNTER — Other Ambulatory Visit: Payer: Self-pay | Admitting: Cardiovascular Disease

## 2020-03-29 ENCOUNTER — Encounter: Payer: Self-pay | Admitting: Dermatology

## 2020-03-29 ENCOUNTER — Other Ambulatory Visit: Payer: Self-pay | Admitting: Cardiovascular Disease

## 2020-03-29 ENCOUNTER — Other Ambulatory Visit: Payer: Self-pay

## 2020-03-29 ENCOUNTER — Ambulatory Visit: Payer: Medicare HMO | Admitting: Dermatology

## 2020-03-29 DIAGNOSIS — D044 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: Secondary | ICD-10-CM | POA: Diagnosis not present

## 2020-03-29 DIAGNOSIS — L570 Actinic keratosis: Secondary | ICD-10-CM

## 2020-03-29 DIAGNOSIS — D485 Neoplasm of uncertain behavior of skin: Secondary | ICD-10-CM

## 2020-03-29 NOTE — Patient Instructions (Signed)

## 2020-04-01 ENCOUNTER — Other Ambulatory Visit: Payer: Self-pay | Admitting: Cardiovascular Disease

## 2020-04-06 ENCOUNTER — Encounter: Payer: Self-pay | Admitting: Dermatology

## 2020-04-06 ENCOUNTER — Ambulatory Visit (INDEPENDENT_AMBULATORY_CARE_PROVIDER_SITE_OTHER): Payer: Medicare HMO

## 2020-04-06 DIAGNOSIS — I495 Sick sinus syndrome: Secondary | ICD-10-CM

## 2020-04-06 LAB — CUP PACEART REMOTE DEVICE CHECK
Battery Impedance: 602 Ohm
Battery Remaining Longevity: 78 mo
Battery Voltage: 2.79 V
Brady Statistic AP VP Percent: 14 %
Brady Statistic AP VS Percent: 86 %
Brady Statistic AS VP Percent: 0 %
Brady Statistic AS VS Percent: 0 %
Date Time Interrogation Session: 20220301084453
Implantable Lead Implant Date: 20080208
Implantable Lead Implant Date: 20140130
Implantable Lead Location: 753859
Implantable Lead Location: 753860
Implantable Lead Model: 5076
Implantable Lead Model: 5076
Implantable Pulse Generator Implant Date: 20140130
Lead Channel Impedance Value: 448 Ohm
Lead Channel Impedance Value: 492 Ohm
Lead Channel Pacing Threshold Amplitude: 0.75 V
Lead Channel Pacing Threshold Amplitude: 1.125 V
Lead Channel Pacing Threshold Pulse Width: 0.4 ms
Lead Channel Pacing Threshold Pulse Width: 0.4 ms
Lead Channel Setting Pacing Amplitude: 2 V
Lead Channel Setting Pacing Amplitude: 2.5 V
Lead Channel Setting Pacing Pulse Width: 0.4 ms
Lead Channel Setting Sensing Sensitivity: 2 mV

## 2020-04-06 NOTE — Progress Notes (Signed)
   Follow-Up Visit   Subjective  Alan [name] is a 80 y.o. male who presents for the following: Follow-up (Patient here today for 3-6 month follow up. Per patient he has a few place on his scalp and ears that need to be looked at. One spot on his scalp does bleed.).  Multiple new crusts, 1 thick on top of scalp Location:  Duration:  Quality:  Associated Signs/Symptoms: Modifying Factors:  Severity:  Timing: Context:   Objective  Well appearing patient in no apparent distress; mood and affect are within normal limits. Objective  Left Cavum, Left Superior Helix, Left Temporal Scalp (2), Mid Parietal Scalp (5), Right Eyebrow, Right Temporal Scalp: Dozen plus hornlike 2 to 4 mm pink crusts  Objective  Left Mid Parietal Scalp: Ill-defined 1 cm waxy pink crust         A focused examination was performed including Head and neck.. Relevant physical exam findings are noted in the Assessment and Plan.   Assessment & Plan    AK (actinic keratosis) (11) Right Eyebrow; Left Superior Helix; Left Cavum; Mid Parietal Scalp (5); Left Temporal Scalp (2); Right Temporal Scalp  Destruction of lesion - Left Cavum, Left Superior Helix, Left Temporal Scalp, Mid Parietal Scalp, Right Eyebrow, Right Temporal Scalp Complexity: simple   Destruction method: cryotherapy   Informed consent: discussed and consent obtained   Timeout:  patient name, date of birth, surgical site, and procedure verified Lesion destroyed using liquid nitrogen: Yes   Cryotherapy cycles:  5 Outcome: patient tolerated procedure well with no complications   Post-procedure details: wound care instructions given    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 Left Mid Parietal Scalp  Skin / nail biopsy Type of biopsy: tangential   Informed consent: discussed and consent obtained   Timeout: patient name, date of birth, surgical site, and procedure verified   Procedure prep:  Patient was prepped and draped in usual  sterile fashion (Non sterile) Prep type:  Chlorhexidine Anesthesia: the lesion was anesthetized in a standard fashion   Anesthetic:  1% lidocaine w/ epinephrine 1-100,000 local infiltration Instrument used: flexible razor blade   Hemostasis achieved with: ferric subsulfate   Outcome: patient tolerated procedure well   Post-procedure details: wound care instructions given    Destruction of lesion Complexity: simple   Destruction method: electrodesiccation and curettage   Informed consent: discussed and consent obtained   Timeout:  patient name, date of birth, surgical site, and procedure verified Anesthesia: the lesion was anesthetized in a standard fashion   Anesthetic:  1% lidocaine w/ epinephrine 1-100,000 local infiltration Curettage performed in three different directions: Yes   Electrodesiccation performed over the curetted area: Yes   Curettage cycles:  1 Lesion length (cm):  1 Lesion width (cm):  1 Margin per side (cm):  0 Final wound size (cm):  1 Hemostasis achieved with:  aluminum chloride Outcome: patient tolerated procedure well with no complications   Post-procedure details: wound care instructions given    Specimen 1 - Surgical pathology Differential Diagnosis: R/O BCC vs SCC  Check Margins: No  Treated after biopsy  After shave biopsy the base was treated with curettage plus cautery.      I, Lavonna Monarch, MD, have reviewed all documentation for this visit.  The documentation on 04/06/20 for the exam, diagnosis, procedures, and orders are all accurate and complete.

## 2020-04-14 NOTE — Progress Notes (Signed)
Remote pacemaker transmission.   

## 2020-04-16 ENCOUNTER — Ambulatory Visit: Admit: 2020-04-16 | Payer: Medicare HMO | Admitting: Neurological Surgery

## 2020-04-16 SURGERY — LUMBAR LAMINECTOMY/DECOMPRESSION MICRODISCECTOMY 1 LEVEL
Anesthesia: General

## 2020-04-21 ENCOUNTER — Other Ambulatory Visit: Payer: Self-pay | Admitting: Cardiovascular Disease

## 2020-04-22 ENCOUNTER — Encounter: Payer: Self-pay | Admitting: General Practice

## 2020-05-07 ENCOUNTER — Other Ambulatory Visit: Payer: Self-pay | Admitting: Cardiovascular Disease

## 2020-05-26 ENCOUNTER — Encounter: Payer: Self-pay | Admitting: Internal Medicine

## 2020-05-26 NOTE — Assessment & Plan Note (Signed)
Benefits from CPAP. Has replaced old machine. Asks to change DME. Plan- Set CPAP auto 5-15, mask of choice, humidifier, supplies,s AirView/ card. Change DME at his request.

## 2020-05-26 NOTE — Assessment & Plan Note (Signed)
Dyspnea on exertion likely multifactorial with obesity, cardiac and deconditioning components Plan- CXR

## 2020-06-01 ENCOUNTER — Other Ambulatory Visit: Payer: Self-pay | Admitting: Cardiovascular Disease

## 2020-06-04 ENCOUNTER — Other Ambulatory Visit: Payer: Self-pay | Admitting: Cardiovascular Disease

## 2020-06-28 ENCOUNTER — Other Ambulatory Visit: Payer: Self-pay

## 2020-06-28 ENCOUNTER — Encounter: Payer: Self-pay | Admitting: Cardiovascular Disease

## 2020-06-28 ENCOUNTER — Ambulatory Visit (INDEPENDENT_AMBULATORY_CARE_PROVIDER_SITE_OTHER): Payer: Medicare HMO | Admitting: Cardiovascular Disease

## 2020-06-28 VITALS — BP 130/58 | HR 65 | Ht 69.0 in | Wt 216.0 lb

## 2020-06-28 DIAGNOSIS — N1832 Chronic kidney disease, stage 3b: Secondary | ICD-10-CM

## 2020-06-28 DIAGNOSIS — I472 Ventricular tachycardia: Secondary | ICD-10-CM

## 2020-06-28 DIAGNOSIS — I495 Sick sinus syndrome: Secondary | ICD-10-CM | POA: Diagnosis not present

## 2020-06-28 DIAGNOSIS — I4729 Other ventricular tachycardia: Secondary | ICD-10-CM

## 2020-06-28 DIAGNOSIS — E669 Obesity, unspecified: Secondary | ICD-10-CM

## 2020-06-28 DIAGNOSIS — G720 Drug-induced myopathy: Secondary | ICD-10-CM

## 2020-06-28 DIAGNOSIS — I25708 Atherosclerosis of coronary artery bypass graft(s), unspecified, with other forms of angina pectoris: Secondary | ICD-10-CM | POA: Diagnosis not present

## 2020-06-28 DIAGNOSIS — E7800 Pure hypercholesterolemia, unspecified: Secondary | ICD-10-CM

## 2020-06-28 DIAGNOSIS — I480 Paroxysmal atrial fibrillation: Secondary | ICD-10-CM | POA: Diagnosis not present

## 2020-06-28 DIAGNOSIS — I10 Essential (primary) hypertension: Secondary | ICD-10-CM

## 2020-06-28 DIAGNOSIS — E119 Type 2 diabetes mellitus without complications: Secondary | ICD-10-CM

## 2020-06-28 DIAGNOSIS — T466X5A Adverse effect of antihyperlipidemic and antiarteriosclerotic drugs, initial encounter: Secondary | ICD-10-CM

## 2020-06-28 DIAGNOSIS — Z950 Presence of cardiac pacemaker: Secondary | ICD-10-CM

## 2020-06-28 MED ORDER — ASPIRIN EC 81 MG PO TBEC: 81.0000 mg | DELAYED_RELEASE_TABLET | Freq: Every day | ORAL | 3 refills | Status: DC

## 2020-06-28 NOTE — Patient Instructions (Addendum)
Medication Instructions:  START Aspirin 81 mg once daily  *If you need a refill on your cardiac medications before your next appointment, please call your pharmacy*   Lab Work: None ordered If you have labs (blood work) drawn today and your tests are completely normal, you will receive your results only by: [name] Kitchen MyChart Message (if you have MyChart) OR . A paper copy in the mail If you have any lab test that is abnormal or we need to change your treatment, we will call you to review the results.   Testing/Procedures: None ordered   Follow-Up: At [facility], you and your health needs are our priority.  As part of our continuing mission to provide you with exceptional heart care, we have created designated Provider Care Teams.  These Care Teams include your primary Cardiologist (physician) and Advanced Practice Providers (APPs -  Physician Assistants and Nurse Practitioners) who all work together to provide you with the care you need, when you need it.  We recommend signing up for the patient portal called "MyChart".  Sign up information is provided on this After Visit Summary.  MyChart is used to connect with patients for Virtual Visits (Telemedicine).  Patients are able to view lab/test results, encounter notes, upcoming appointments, etc.  Non-urgent messages can be sent to your provider as well.   To learn more about what you can do with MyChart, go to [email or web address].    Your next appointment:   12 month(s)  The format for your next appointment:   In Person  Provider:   Sanda Klein, MD

## 2020-06-28 NOTE — Progress Notes (Signed)
Cardiology Office Note    Date:  06/29/2020   ID:  LUCCIANO VITALI, DOB 1941/01/30, MRN [id number]  PCP:  [name], MD  Cardiologist:  Sanda Klein, MD   No chief complaint on file.   History of Present Illness:  Alan [name] is a 80 y.o. male with coronary artery disease, previous CABG, sinus node arrest, status post dual-chamber permanent pacemaker ("atrially dependent"), history of atrial fibrillation status post remote ablation in [facility], left atrial appendage clipping (with a residual permeable lobe of the left atrial appendage), type 2 diabetes mellitus and dyslipidemia, obstructive sleep apnea on CPAP, here for follow up.  He is felt great since he had his cardioversion a year and a half ago.  He has not had any problems with exertional dyspnea or angina, orthopnea, PND, edema, palpitations, dizziness or syncope or claudication or focal neurological events.  He has not had any serious falls or bleeding problems.  He is limited primarily by low back pain due to problems with his right sacroiliac joint.  Surgery has been offered by Dr. Ronnald Ramp, but declined by the patient's insurance company.  He felt poorly while in atypical atrial flutter/atrial fibrillation in September-December 2020 (the atrial arrhythmia was accompanied by a marked increase in ventricular pacing, which he does not tolerate well when we test his device in the office).  On 01/15/2019, he underwent TEE that showed that although his left atrial appendage was surgically clipped, there did appear to be a permeable lobe (without thrombus).  Atrial overdrive pacing via the dual-chamber pacemaker was unsuccessful.  He subsequently underwent cardioversion successfully.  This led to improvement in symptoms.  He took warfarin for a while after his cardioversion, but has stopped it since then.  He does not know why.  He could not afford direct oral anticoagulant such as Eliquis.  Pacemaker interrogation  (Medtronic Adapta 2014, estimated generator longevity another 6 years) shows that he has not had any meaningful episodes of true atrial fibrillation since his cardioversion in December 2020, although he has had some organized atrial tachycardia.  The overall burden of atrial tachycardia has been less than 0.1%, with the longest episode lasting only 11 minutes.  There have been no episodes of high ventricular rate.  He is "atrial lead pacemaker dependent" and has 99.6% atrial pacing and only 16% ventricular pacing.  Lead parameters look great.  Recent labs show normal fasting blood glucose at 82 and hemoglobin A1c in desirable range at 6.7%, fair lipid profile with LDL not quite at target at 81, creatinine up to 1.86, normal potassium of 5.0, normal liver function tests  Cardiac catheterization in November 2020 showed unchanged anatomy (the native LAD and RCA arteries are chronically occluded, but have good arterial supply via the LIMA and SVG bypasses, respectively, both appearing free of disease.  There was a 99% stenosis in the distal left circumflex coronary artery, too small for PCI.  The LVEF was 50-55%, but the LVEDP was elevated at 18 mmHg.  He has been feeling poorly  (dyspnea with walking in the house, less prominent exertional chest tightness) for the last 3 months. This seems to correlate with recurrent atrial fibrillation, starting September 9 and persistent since then. Ventricular rate is well controlled.  Otherwise denies edema, syncope, angina, palpitations, falls, injuries or bleeding events..  Reports good glycemic control.  His pacemaker is functioning normally on a comprehensive check today.  The device was implanted in 2014 (Medtronic Adapta dual-chamber) and still  has roughly 8 years of battery longevity.  He is "atrially dependent" without any detectable atrial activity and with 98% atrial pacing before onset of AFib (now w 47% AFib in last 6 months).  Ventricular pacing has  increased during the AFib, from 2% to 21%.    He has a longstanding history of cardiac problems. He had CABG in 1990 (sequential LIMA to LAD and diagonal), multiple RCA PCI 1990-2004 and a redo CABG in 2004 (SVG-RCA-PDA, with surgical MAZE procedure, Dr. Amador Cunas). Cath 2007 and 2012 show occluded LAD and RCA with patent grafts, 60% ostial OM and diffuse stenoses distal LCX. 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 He had a generator changeout and ventricular lead revision in January 2014. He does not tolerate VVI pacing. He has had occasional nonsustained VT recorded by the device, always asymptomatic. He has had paroxysmal atrial fibrillation in the remote past. No history of stroke or TIA. Warfarin was stopped for recurrent GI bleeding requiring transfusions. Despite stopping warfarin anticoagulation, he had another episode of GI bleeding requiring transfusion in April 2015. He was seen at [facility] and underwent repeat endoscopy (endoscopic workup in February was negative). He tells me they found "2 small holes in his stomach" which were cauterized. He has not had any bleeding events since. He remains off anti-coagulation.  He wears CPAP for OSA, has insulin requiring type II DM, mild dyslipidemia intolerant to all statins, post-polio syndrome, lumbar spine surgery 01/2012.  He does not trust nuclear stress tests - reports they were repeatedly wrong in the past. [name] told him he should always have a cath rather than a stress test.    Past Medical History:  Diagnosis Date  . Anticoagulant long-term use   . Arthritis   . Arthropathy of left shoulder   . Atypical mole   . Biceps tendonitis, left   . Bursitis of left shoulder   . Cardiac pacemaker in situ    first insertion 2008;  genertor and new lead change 03-08-2012;    Medtronic  . CHB (complete heart block) ([place])    s/p  PPM 2008  . Chronic back pain   . CKD (chronic kidney disease), stage III ([place])   . Coronary artery disease cardiologist--- dr Eola Waldrep   hx  CABG x2  1990 and re-do 2004;  multiple PCI to [place] to 2004;    cardiac cath 2012  occluded LAD and RCA with patent grafts  . Difficult intubation    " Dr. Sallyanne Kuster said that it was difficulty to get the tube in."  . History of coronary angioplasty    multiple PCI to RCA ,  1990 to 2004  . History of GI bleed followed by GI-- Olevia Perches PA @ [facility] [place]   01/ 2020  upper and lower GI bleed ;  s/p  EGD with cautery of jejunal bleed and blood transfusion's  . History of kidney stones   . Hx of echocardiogram 06/29/2008   EF 45-50%   . Hypertension   . Ischemic cardiomyopathy    previously reported , ef 40-45%;  2010--- ef 45-50%;   last echo 2017 55-60%  . Mixed hyperlipidemia   . Nocturia   . OSA on CPAP    cpap, 12, last sleep study Jan2013, sees Dr. Keturah Barre  .  PAF (paroxysmal atrial fibrillation) (HCC)    long hx PAF--- followed by dr Uva Runkel  . Post-polio syndrome    dx polio age 30;   09-30-2018  per pt occasional gait issues and occasion fall  . Presence of permanent cardiac pacemaker   . Pseudoarthrosis of lumbar spine   . Rotator cuff tear, left   . S/P CABG x 2    1990 x2  and 2004  x2  . Sinus node dysfunction ([place])    2015--- sinus node arrest  with intact AV conduction  . Squamous cell carcinoma of skin   . Type 2 diabetes mellitus ([place])    followed by pcp    Past Surgical History:  Procedure Laterality Date  . APPENDECTOMY  child  . BLEPHAROPLASTY Bilateral 12/2014   upper eyelid  . CARDIAC PACEMAKER PLACEMENT  2008  . CARDIOVERSION N/A 01/15/2019   Procedure: CARDIOVERSION;  Surgeon: Sanda Klein, MD;  Location: [place] ENDOSCOPY;  Service: Cardiovascular;  Laterality: N/A;  . CARPAL TUNNEL RELEASE Bilateral 2015;  2016  . CATARACT  EXTRACTION W/ INTRAOCULAR LENS  IMPLANT, BILATERAL  2017  . CORONARY ANGIOPLASTY  1990  to 2004   multiple to RCA  . CORONARY ARTERY BYPASS GRAFT  1990    @ [place] (dr chitwood)   seq. LIMA to LAD and Diagonal  . CORONARY ARTERY BYPASS GRAFT  2004     dr Amador Cunas   SVG to  RCA and PDA;  ALSO MAZE  PROCEDURE  . ESOPHAGOGASTRODUODENOSCOPY Left 03/18/2013   Procedure: ESOPHAGOGASTRODUODENOSCOPY (EGD);  Surgeon: Cleotis Nipper, MD;  Location: [facility] ENDOSCOPY;  Service: Endoscopy;  Laterality: Left;  . HARDWARE REMOVAL N/A 05/18/2016   Procedure: Removal of broken hardware Lumbar three-four;  Surgeon: [name], MD;  Location: [place];  Service: Neurosurgery;  Laterality: N/A;  . HERNIA REPAIR    . LEFT HEART CATH AND CORS/GRAFTS ANGIOGRAPHY N/A 12/18/2018   Procedure: LEFT HEART CATH AND CORS/GRAFTS ANGIOGRAPHY;  Surgeon: Wellington Hampshire, MD;  Location: [place] CV LAB;  Service: Cardiovascular;  Laterality: N/A;  . LEFT HEART CATHETERIZATION WITH CORONARY/GRAFT ANGIOGRAM N/A 12/21/2010   Procedure: LEFT HEART CATHETERIZATION WITH Beatrix Fetters;  Surgeon: Leonie Man, MD;  Location: [facility] CATH LAB;  Service: Cardiovascular;  Laterality: N/A;  . LEG SURGERY Right 2002  approx.   lengthened his leg  . LUMBAR LAMINECTOMY/DECOMPRESSION MICRODISCECTOMY  01/10/2012   Procedure: LUMBAR LAMINECTOMY/DECOMPRESSION MICRODISCECTOMY 1 LEVEL;  Surgeon: [name], MD;  Location: [place] [facility]URO [facility];  Service: Neurosurgery;  Laterality: Bilateral;  Lumbar three-four decompression, Posterior lateral fusion lumbar three-four, posterior spinus plate lumbar three-four  . LUMBAR LAMINECTOMY/DECOMPRESSION MICRODISCECTOMY Left 05/18/2016   Procedure: Laminectomy and Foraminotomy - Lumbar two-lumbar three- Lumbar four-lumbar five- left with removal hardware lumbar three-four;  Surgeon: [name], MD;  Location: [place];  Service: Neurosurgery;  Laterality: Left;  . NASAL SEPTUM SURGERY  yrs ago  .  PACEMAKER REVISION N/A 03/07/2012   Procedure: PACEMAKER REVISION;  Surgeon: Sanda Klein, MD;  Location: [place] CATH LAB;  Service: Cardiovascular;  Laterality: N/A;  . POSTERIOR LUMBAR FUSION  02-22-2017   dr Ronnald Ramp  @ [facility]   re-do laminectomy L2-3 and fusion  . POSTERIOR LUMBAR FUSION  03-11-2018   dr Ronnald Ramp @ [facility]   laminectomy L4-5;  fixation L2-S1 and fusion L3-S1  . SHOULDER ARTHROSCOPY Right after 2016, pt unsure year  . SHOULDER ARTHROSCOPY WITH OPEN ROTATOR CUFF REPAIR Right 03/30/2014   Procedure: RIGHT SHOULDER  ARTHROSCOPY  OPEN ROTATOR CUFF REPAIR;  Surgeon: Kerin Salen, MD;  Location: [place];  Service: Orthopedics;  Laterality: Right;  . SHOULDER ARTHROSCOPY WITH SUBACROMIAL DECOMPRESSION, ROTATOR CUFF REPAIR AND BICEP TENDON REPAIR Left 10/01/2018   Procedure: LEFT SHOULDER ARTHROSCOPY, DISTAL CLAVICLE EXCISION,SUBACROMIAL, DECOMPRESSION, RORATOR CUFF REPAIR, BICEPS TENODESIS;  Surgeon: Renette Butters, MD;  Location: [facility];  Service: Orthopedics;  Laterality: Left;  . SHOULDER OPEN ROTATOR CUFF REPAIR Right 03/30/2014   Procedure: ROTATOR CUFF REPAIR SHOULDER OPEN;  Surgeon: Kerin Salen, MD;  Location: [place];  Service: Orthopedics;  Laterality: Right;  . TEE WITHOUT CARDIOVERSION N/A 01/15/2019   Procedure: TRANSESOPHAGEAL ECHOCARDIOGRAM (TEE);  Surgeon: Sanda Klein, MD;  Location: [place];  Service: Cardiovascular;  Laterality: N/A;  . TONSILLECTOMY  child  . TOTAL KNEE ARTHROPLASTY Left 2006    Current Medications: Outpatient Medications Prior to Visit  Medication Sig Dispense Refill  . amLODipine (NORVASC) 10 MG tablet Take 10 mg by mouth daily.     [name] Kitchen CALCIUM-VITAMIN D PO Take by mouth.    . carvedilol (COREG) 25 MG tablet TAKE 2 TABLETS BY MOUTH TWICE DAILY WITH A MEAL . APPOINTMENT REQUIRED FOR FUTURE REFILLS 120 tablet 0  . ferrous sulfate 324 MG TBEC Take 324 mg by mouth.    [name] Kitchen glipiZIDE (GLUCOTROL) 10 MG tablet Take 10 mg by mouth 2 (two) times  daily.    . hydrochlorothiazide (HYDRODIURIL) 25 MG tablet Take 25 mg by mouth daily.     . insulin lispro (HUMALOG) 100 UNIT/ML injection Inject 2-8 Units into the skin as needed for high blood sugar.     . losartan (COZAAR) 100 MG tablet Take 100 mg by mouth daily.     . metFORMIN (GLUCOPHAGE) 500 MG tablet Take 500 mg by mouth 2 (two) times daily with a meal. Takes with 500mg  XR    . Multiple Vitamin (MULTIVITAMIN WITH MINERALS) TABS tablet Take 1 tablet by mouth daily.     . Omega-3 Fatty Acids (FISH OIL) 1200 MG CAPS Take 1,200 mg by mouth daily.    [name] Kitchen apixaban (ELIQUIS) 5 MG TABS tablet Take 5 mg by mouth 2 (two) times daily.    . fluticasone (FLONASE) 50 MCG/ACT nasal spray Place 2 sprays into both nostrils daily as needed for allergies.     [name] Kitchen amiodarone (PACERONE) 200 MG tablet TAKE 400 MG (2 TABLETS) ONCE DAILY FOR THE FIRST 14 DAYS, THEN TAKE 200 (1 TABLET) ONCE DAILY 45 tablet 0  . Ascorbic Acid (VITAMIN C) 1000 MG tablet Take 1,000 mg by mouth daily.    . Calcium Carb-Cholecalciferol (CALCIUM 600+D3 PO) Take 1 tablet by mouth every evening.    . carboxymethylcellulose (REFRESH PLUS) 0.5 % SOLN Place 1 drop into both eyes daily as needed (dry/irritated eyes.).     [name] Kitchen colchicine 0.6 MG tablet Take 0.6-1.2 mg by mouth daily as needed (gout flare ups).     . diclofenac sodium (VOLTAREN) 1 % GEL Apply 2 g topically daily as needed (pain lower back).     . Flaxseed, Linseed, (FLAXSEED OIL MAX STR) 1300 MG CAPS Take 1,300 mg by mouth every evening.    . furosemide (LASIX) 40 MG tablet Take 1 tablet by mouth once daily 30 tablet 0  . JANUMET 50-1000 MG tablet Take 1 tablet by mouth 2 (two) times daily.    [name] Kitchen JANUVIA 100 MG tablet Take 100 mg by mouth daily.    . Magnesium 250 MG TABS  Take 250 mg by mouth every evening.    . metFORMIN (GLUCOPHAGE-XR) 500 MG 24 hr tablet Take 500 mg by mouth 2 (two) times daily. Takes with 500mg  regular tablet    . oxyCODONE (OXY IR/ROXICODONE) 5 MG immediate  release tablet Take 1 tablet (5 mg total) by mouth every 4 (four) hours as needed for severe pain ((score 4 to 6)). 40 tablet 0  . Potassium Chloride ER 20 MEQ TBCR Take 1 tablet by mouth once daily 30 tablet 0  . warfarin (COUMADIN) 5 MG tablet TAKE 1 TABLET BY MOUTH ONCE DAILY OR  AS  DIRECTED  BY  COUMADIN  CLINIC.  FIRST  DOSE  ON  WED  NOV  25 (Patient taking differently: Take 2.5-5 mg by mouth See admin instructions. 5mg  daily except 2.5mg  fridays and sundays) 30 tablet 0  . sodium chloride flush (NS) 0.9 % injection 3 mL      No facility-administered medications prior to visit.     Allergies:   Patient has no known allergies.   Social History   Socioeconomic History  . Marital status: Single    Spouse name: Not on file  . Number of children: 1  . Years of education: 64  . Highest education level: Not on file  Occupational History  . Occupation: pilot-retired  Tobacco Use  . Smoking status: Never Smoker  . Smokeless tobacco: Never Used  Vaping Use  . Vaping Use: Never used  Substance and Sexual Activity  . Alcohol use: Not Currently  . Drug use: Never  . Sexual activity: Yes  Other Topics Concern  . Not on file  Social History Narrative   [place] [address] [place] [facility], fishes.    Right-handed.   1 cup caffeine per day.   Social Determinants of Health   Financial Resource Strain: Not on file  Food Insecurity: Not on file  Transportation Needs: Not on file  Physical Activity: Not on file  Stress: Not on file  Social Connections: Not on file     Family History:  The patient's family history includes Cancer in his mother; Other in his father.   ROS:   Please see the history of present illness.    ROS All other systems are reviewed and are negative.  PHYSICAL EXAM:   VS:  BP (!) 130/58   Pulse 65   Ht 5\' 9"  (1.753 m)   Wt 216 lb (98 kg)   SpO2 98%   BMI 31.90 kg/m       General: Alert, oriented x3, no distress, mildly obese.  Healthy left  subclavian pacemaker site. Head: no evidence of trauma, PERRL, EOMI, no exophtalmos or lid lag, no myxedema, no xanthelasma; normal ears, nose and oropharynx Neck: normal jugular venous pulsations and no hepatojugular reflux; brisk carotid pulses without delay and no carotid bruits Chest: clear to auscultation, no signs of consolidation by percussion or palpation, normal fremitus, symmetrical and full respiratory excursions Cardiovascular: normal position and quality of the apical impulse, regular rhythm, normal first and second heart sounds, no murmurs, rubs or gallops Abdomen: no tenderness or distention, no masses by palpation, no abnormal pulsatility or arterial bruits, normal bowel sounds, no hepatosplenomegaly Extremities: no clubbing, cyanosis or edema; 2+ radial, ulnar and brachial pulses bilaterally; 2+ right femoral, posterior tibial and dorsalis pedis pulses; 2+ left femoral, posterior tibial and dorsalis pedis pulses; no subclavian or femoral bruits Neurological: grossly nonfocal Psych: Normal mood and affect    Wt  Readings from Last 3 Encounters:  06/28/20 216 lb (98 kg)  03/10/20 228 lb (103.4 kg)  04/07/19 213 lb (96.6 kg)     Studies/Labs Reviewed:   EKG:  EKG is ordered today.  It shows background atrial fibrillation with 100% V paced rhythm  Recent Labs: No results found for requested labs within last 8760 hours.   Lipid Panel     Component Value Date/Time   CHOL 134 12/20/2010 0903   TRIG 104 12/20/2010 0903   HDL 38 (L) 12/20/2010 0903   CHOLHDL 3.5 12/20/2010 0903   VLDL 21 12/20/2010 0903   LDLCALC 75 12/20/2010 0903    10/30/2017 Creat 1.34, GFR 51. CO2 19, K 5.1. 10/16/2017 Hgb A1c 6.5% Lipid Panel  Dr. Melford Aase, December 2018 Total cholesterol 162, triglycerides 1 7, HDL, LDL 95 Creatinine 1.45, hemoglobin 11.3  ASSESSMENT:    1. Coronary artery disease of bypass graft of native heart with stable angina pectoris ([place])   2. PAF (paroxysmal atrial  fibrillation) ([place])   3. SSS (sick sinus syndrome) ([place])   4. NSVT (nonsustained ventricular tachycardia) ([place])   5. Pacemaker - Medtronic Adapta dual chamber   6. Essential hypertension   7. Controlled type 2 diabetes mellitus without complication, without long-term current use of insulin ([place])   8. Hypercholesterolemia   9. Statin myopathy   10. Stage 3b chronic kidney disease ([place])   11. Mild obesity      PLAN:  In order of problems listed above:  1. CAD s/p redo CABG with exertional angina: He had angina while in atrial fibrillation when he also had a very frequent ventricular pacing.  Coronary angiography showed stable anatomy of native and bypass vessels.  All the major myocardial territories have good arterial supply via native vessels or grafts, with the exception of a possible small area of ischemic myocardium in the posterior wall.  Once the atrial fibrillation was corrected, angina has not bothered him.  2.  AFib: He has a remote history of endocardial ablation and has very infrequent recurrent persistent atrial fibrillation, but this is highly symptomatic despite rate control. CHADSVasc score 4 (age, vascular disease, HTN, DM).  He has had problems with GI bleeding while on anticoagulation.  He has now maintained normal rhythm (atrial paced, ventricular sensed) for a year and a half since his last cardioversion, despite not using any true antiarrhythmic medications.  If atrial fibrillation recurs, I think he might be a good candidate for consideration for redo ablation, since the electrogram suggests atypical atrial flutter.  He might also be a good candidate for watchman device, since he has proven to be able to take short-term anticoagulation, but has developed serious bleeding with long-term anticoagulation.  In the meantime, we will use his pacemaker to monitor for arrhythmia recurrence.  3. Sinus node arrest/CHB: He is "atrially pacemaker dependent" with 100% atrial pacing  outside of atrial fibrillation.  He has normal AV conduction during atrial paced rhythm, albeit with a very long AV delay, consistent with atrioventricular conduction abnormality. The prevalence of V pacing increased during AFib, and this may be contributing to his poor functional status, exertional angina and exertional dyspnea.  4. NSVT: None detected recently.  Episodes are infrequent, a few times a year, consistently asymptomatic.   5. PPM: Normal device function.  Atrially pacemaker dependent.  Good heart rate histogram distribution with current sensor settings.  No symptoms of chronotropic incompetence.  Continue remote downloads every 3 months.  6. HTN: Excellent blood pressure  control.  7. DM: Improved diabetes control.  8. HLP: He has statin myopathy and has been intolerant to every single available statin as well as Zetia.  Have offered treatment with a PCSK9 inhibitor but he has declined due to cost.  His LDL cholesterol with conservative management is not bad at 81.  Cost also precludes newer agents such as Nexletol or Leqvio.  9. CKD 3B: Creatinine is stable at 1.89 (was 1.92 last March 2021), corresponding to a GFR in the 30-35 range.  He is on losartan and high-dose and his glucose and blood pressure are well controlled.  10. Obesity: Mildly obese.  Sacroiliac joint problems limited activity.    Medication Adjustments/Labs and Tests Ordered: Current medicines are reviewed at length with the patient today.  Concerns regarding medicines are outlined above.  Medication changes, Labs and Tests ordered today are listed in the Patient Instructions below. Patient Instructions  Medication Instructions:  START Aspirin 81 mg once daily  *If you need a refill on your cardiac medications before your next appointment, please call your pharmacy*   Lab Work: None ordered If you have labs (blood work) drawn today and your tests are completely normal, you will receive your results only  by: [name] Kitchen MyChart Message (if you have MyChart) OR . A paper copy in the mail If you have any lab test that is abnormal or we need to change your treatment, we will call you to review the results.   Testing/Procedures: None ordered   Follow-Up: At [facility], you and your health needs are our priority.  As part of our continuing mission to provide you with exceptional heart care, we have created designated Provider Care Teams.  These Care Teams include your primary Cardiologist (physician) and Advanced Practice Providers (APPs -  Physician Assistants and Nurse Practitioners) who all work together to provide you with the care you need, when you need it.  We recommend signing up for the patient portal called "MyChart".  Sign up information is provided on this After Visit Summary.  MyChart is used to connect with patients for Virtual Visits (Telemedicine).  Patients are able to view lab/test results, encounter notes, upcoming appointments, etc.  Non-urgent messages can be sent to your provider as well.   To learn more about what you can do with MyChart, go to [email or web address].    Your next appointment:   12 month(s)  The format for your next appointment:   In Person  Provider:   Sanda Klein, MD       Signed, Sanda Klein, MD  06/29/2020 6:30 PM    [address], [place], [place]  [address] Phone: [phone]; Fax: [phone]

## 2020-06-30 ENCOUNTER — Other Ambulatory Visit: Payer: Self-pay | Admitting: Neurological Surgery

## 2020-07-01 ENCOUNTER — Other Ambulatory Visit: Payer: Self-pay | Admitting: Cardiovascular Disease

## 2020-07-01 ENCOUNTER — Other Ambulatory Visit: Payer: Self-pay | Admitting: Neurological Surgery

## 2020-07-06 ENCOUNTER — Ambulatory Visit (INDEPENDENT_AMBULATORY_CARE_PROVIDER_SITE_OTHER): Payer: Medicare HMO

## 2020-07-06 DIAGNOSIS — I495 Sick sinus syndrome: Secondary | ICD-10-CM

## 2020-07-09 ENCOUNTER — Telehealth: Payer: Self-pay | Admitting: Cardiovascular Disease

## 2020-07-09 NOTE — Telephone Encounter (Signed)
Left a message for the patient to call back.  

## 2020-07-09 NOTE — Telephone Encounter (Signed)
I reviewed the records from his hospitalization and I really do not see a mention of ventricular tachycardia.  There was some concern about mild apical hypokinesis reported on his echocardiogram.  EF was 50-55%.  Our transthoracic echo from 2020 reported EF 45-50%.  TEE showed apical wall motion abnormality attributable to RV apical pacing.  If the echo was performed while he was having ventricular pacing (which he does about 30% of the time) that could easily explain the change.  There is no indication for coronary work-up at this time, in fact pursuing any type of coronary revascularization while he is having GI bleeding (that we cannot easily get to) would be potentially disastrous. The focuses on identifying and treating the cause of his GI bleeding at this time.

## 2020-07-09 NOTE — Telephone Encounter (Signed)
New Message:    Pt said he was discharged from [facility] at [facility]. He was told to see Dr C asap for  V Tach.

## 2020-07-09 NOTE — Telephone Encounter (Signed)
The patient has been made aware of Dr. Victorino [name] recommendations. He stated that he does not have a GI bleed anymore.  He stated that he would still like an appointment with Dr. Sallyanne [name]. He has been advised that there were no openings as of right now. We will call him if an opening occurs next week.   Message will also be sent to scheduling for first available.

## 2020-07-09 NOTE — Telephone Encounter (Signed)
Patient has 2 remote transmissions in system, A carelink express transmission completed 07/07/20 ~10pm and manual remote that was sent this morning.    Both transmissions were reviewed, patient dod not appear to have any Ventricular arrythmias    Pt was hospitalized for GI bleeding 5/30-yesterday, pt states on 6/1 he had an experience where he became extremely short of breath, states it felt like he was drowning.  He reports he was told by the doctors at [facility] he was in VT and needs to see his cardiologist ASAP upon discharge.   Pt denies any current cardiac symptoms.  I advised patient that his device interrogations do not show VT episodes.  I will forward to Dr Sallyanne Kuster for review.  Pt insistent that he needs to be seen by Dr. Sallyanne Kuster asap.

## 2020-07-09 NOTE — Telephone Encounter (Signed)
I agree there is no evidence of ventricular tachycardia.  It is possible that he had ventricular tracking of paroxysmal atrial tachycardia, interpreted on a telemetry strip as being VT.

## 2020-07-12 ENCOUNTER — Encounter: Payer: Self-pay | Admitting: *Deleted

## 2020-07-14 NOTE — Telephone Encounter (Signed)
Left a message for the patient to call back if he would like one of the available appointments for tomorrow or Friday with Dr. Sallyanne Kuster.

## 2020-07-15 ENCOUNTER — Encounter: Payer: Self-pay | Admitting: Cardiovascular Disease

## 2020-07-15 NOTE — Progress Notes (Signed)
Surgical Instructions    Your procedure is scheduled on 07/20/20.  Report to [facility] Main Entrance "A" at 5:30 A.M., then check in with the Admitting office.  Call this number if you have problems the morning of surgery:  [phone]   If you have any questions prior to your surgery date call [phone]: Open Monday-Friday 8am-4pm    Remember:  Do not eat or drink after midnight the night before your surgery     Take these medicines the morning of surgery with A SIP OF WATER:  amiodarone (PACERONE)  amLODipine (NORVASC) carvedilol (COREG)  pantoprazole (PROTONIX) fluticasone (FLONASE) - as needed    As of today, STOP taking any Aspirin (unless otherwise instructed by your surgeon) Aleve, Naproxen, Ibuprofen, Motrin, Advil, Goody's, BC's, all herbal medications, fish oil, and all vitamins.  WHAT DO I DO ABOUT MY DIABETES MEDICATION?   Do not take oral diabetes medicines (pills) the morning of surgery. Do Not take Glipizide the night before surgery or the morning of surgery   The day of surgery, do not take other diabetes injectables, including Byetta (exenatide), Bydureon (exenatide ER), Victoza (liraglutide), or Trulicity (dulaglutide).  If your CBG is greater than 220 mg/dL, you may take  of your sliding scale (correction) dose of insulin.   HOW TO MANAGE YOUR DIABETES BEFORE AND AFTER SURGERY  Why is it important to control my blood sugar before and after surgery? Improving blood sugar levels before and after surgery helps healing and can limit problems. A way of improving blood sugar control is eating a healthy diet by:  Eating less sugar and carbohydrates  Increasing activity/exercise  Talking with your doctor about reaching your blood sugar goals High blood sugars (greater than 180 mg/dL) can raise your risk of infections and slow your recovery, so you will need to focus on controlling your diabetes during the weeks before surgery. Make sure that the doctor  who takes care of your diabetes knows about your planned surgery including the date and location.  How do I manage my blood sugar before surgery? Check your blood sugar at least 4 times a day, starting 2 days before surgery, to make sure that the level is not too high or low.  Check your blood sugar the morning of your surgery when you wake up and every 2 hours until you get to the Short Stay unit.  If your blood sugar is less than 70 mg/dL, you will need to treat for low blood sugar: Do not take insulin. Treat a low blood sugar (less than 70 mg/dL) with  cup of clear juice (cranberry or apple), 4 glucose tablets, OR glucose gel. Recheck blood sugar in 15 minutes after treatment (to make sure it is greater than 70 mg/dL). If your blood sugar is not greater than 70 mg/dL on recheck, call [phone] for further instructions. Report your blood sugar to the short stay nurse when you get to Short Stay.  If you are admitted to the hospital after surgery: Your blood sugar will be checked by the staff and you will probably be given insulin after surgery (instead of oral diabetes medicines) to make sure you have good blood sugar levels. The goal for blood sugar control after surgery is 80-180 mg/dL.           Do not wear jewelry  Do not wear lotions, powders, colognes, or deodorant. Do not shave 48 hours prior to surgery.  Men may shave face and neck. Do not bring valuables to  the hospital.              [facility] is not responsible for any belongings or valuables.  Do NOT Smoke (Tobacco/Vaping) or drink Alcohol 24 hours prior to your procedure If you use a CPAP at night, you may bring all equipment for your overnight stay.   Contacts, glasses, dentures or bridgework may not be worn into surgery, please bring cases for these belongings   For patients admitted to the hospital, discharge time will be determined by your treatment team.   Patients discharged the day of surgery will not be  allowed to drive home, and someone needs to stay with them for 24 hours.  ONLY 1 SUPPORT PERSON MAY BE PRESENT WHILE YOU ARE IN SURGERY. IF YOU ARE TO BE ADMITTED ONCE YOU ARE IN YOUR ROOM YOU WILL BE ALLOWED TWO (2) VISITORS.  Minor children may have two parents present. Special consideration for safety and communication needs will be reviewed on a case by case basis.  Special instructions:    Oral Hygiene is also important to reduce your risk of infection.  Remember - BRUSH YOUR TEETH THE MORNING OF SURGERY WITH YOUR REGULAR TOOTHPASTE   [place]- Preparing For Surgery  Before surgery, you can play an important role. Because skin is not sterile, your skin needs to be as free of germs as possible. You can reduce the number of germs on your skin by washing with CHG (chlorahexidine gluconate) Soap before surgery.  CHG is an antiseptic cleaner which kills germs and bonds with the skin to continue killing germs even after washing.     Please do not use if you have an allergy to CHG or antibacterial soaps. If your skin becomes reddened/irritated stop using the CHG.  Do not shave (including legs and underarms) for at least 48 hours prior to first CHG shower. It is OK to shave your face.  Please follow these instructions carefully.     Shower the NIGHT BEFORE SURGERY and the MORNING OF SURGERY with CHG Soap.   If you chose to wash your hair, wash your hair first as usual with your normal shampoo. After you shampoo, rinse your hair and body thoroughly to remove the shampoo.  Then [employer] and genitals (private parts) with your normal soap and rinse thoroughly to remove soap.  After that Use CHG Soap as you would any other liquid soap. You can apply CHG directly to the skin and wash gently with a scrungie or a clean washcloth.   Apply the CHG Soap to your body ONLY FROM THE NECK DOWN.  Do not use on open wounds or open sores. Avoid contact with your eyes, ears, mouth and genitals (private  parts). Wash Face and genitals (private parts)  with your normal soap.   Wash thoroughly, paying special attention to the area where your surgery will be performed.  Thoroughly rinse your body with warm water from the neck down.  DO NOT shower/wash with your normal soap after using and rinsing off the CHG Soap.  Pat yourself dry with a CLEAN TOWEL.  Wear CLEAN PAJAMAS to bed the night before surgery  Place CLEAN SHEETS on your bed the night before your surgery  DO NOT SLEEP WITH PETS.   Day of Surgery:  Take a shower with CHG soap. Wear Clean/Comfortable clothing the morning of surgery Do not apply any deodorants/lotions.   Remember to brush your teeth WITH YOUR REGULAR TOOTHPASTE.   Please read over the  following fact sheets that you were given.

## 2020-07-15 NOTE — Progress Notes (Signed)
[place] DEVICE PROGRAMMING  Patient Information: Name:  Alan [name]  DOB:  [birth year]  MRN:  [id number]    Alan Clay, RN  P Cv Div [facility] Device Cc:Alan Bigness, RN Patient Alan [name], [place] [birth year]  Planned Procedure:  Sacroiliac joint fusion  Surgeon:  Dr. Pieter Partridge [name]  Date of Procedure:  07/20/20  Cautery will be used.  Position during surgery:     Please send documentation back to:  Alan [name] (Fax # [phone])   Alan Clay, RN  10/21/2019 10:39 AM  Device Information:  Clinic EP Physician:  Dr. Dani Gobble [name]   Device Type:  Pacemaker Manufacturer and Phone #:  Medtronic: [phone] Pacemaker Dependent?:  Yes.   Date of Last Device Check:  06/28/20 Normal Device Function?:  Yes.    Electrophysiologist's Recommendations:  Have magnet available. Provide continuous ECG monitoring when magnet is used or reprogramming is to be performed.  Procedure may interfere with device function.  Magnet should be placed over device during procedure.  Per Device Clinic Standing Orders, Alan Ram, RN  3:27 PM 07/15/2020

## 2020-07-15 NOTE — Telephone Encounter (Signed)
Message left for the patient to call back if he wanted an appointment for tomorrow and if anything further was needed. MyChart message sent as well.

## 2020-07-16 ENCOUNTER — Encounter (HOSPITAL_COMMUNITY): Payer: Self-pay

## 2020-07-16 ENCOUNTER — Encounter (HOSPITAL_COMMUNITY)
Admission: RE | Admit: 2020-07-16 | Discharge: 2020-07-16 | Disposition: A | Discharge status: Home / Self Care | Payer: Medicare HMO | Source: Ambulatory Visit | Attending: Neurological Surgery | Admitting: Neurological Surgery

## 2020-07-16 ENCOUNTER — Other Ambulatory Visit: Payer: Self-pay

## 2020-07-16 DIAGNOSIS — Z955 Presence of coronary angioplasty implant and graft: Secondary | ICD-10-CM | POA: Insufficient documentation

## 2020-07-16 DIAGNOSIS — I340 Nonrheumatic mitral (valve) insufficiency: Secondary | ICD-10-CM | POA: Insufficient documentation

## 2020-07-16 DIAGNOSIS — Z950 Presence of cardiac pacemaker: Secondary | ICD-10-CM | POA: Insufficient documentation

## 2020-07-16 DIAGNOSIS — Z20822 Contact with and (suspected) exposure to covid-19: Secondary | ICD-10-CM | POA: Diagnosis not present

## 2020-07-16 DIAGNOSIS — I2510 Atherosclerotic heart disease of native coronary artery without angina pectoris: Secondary | ICD-10-CM | POA: Insufficient documentation

## 2020-07-16 DIAGNOSIS — Z01812 Encounter for preprocedural laboratory examination: Secondary | ICD-10-CM | POA: Diagnosis not present

## 2020-07-16 DIAGNOSIS — Z951 Presence of aortocoronary bypass graft: Secondary | ICD-10-CM | POA: Diagnosis not present

## 2020-07-16 LAB — CBC WITH DIFFERENTIAL/PLATELET
Abs Immature Granulocytes: 0.01 10*3/uL (ref 0.00–0.07)
Basophils Absolute: 0 10*3/uL (ref 0.0–0.1)
Basophils Relative: 1 %
Eosinophils Absolute: 0.1 10*3/uL (ref 0.0–0.5)
Eosinophils Relative: 2 %
HCT: 28.8 % — ABNORMAL LOW (ref 39.0–52.0)
Hemoglobin: 8.8 g/dL — ABNORMAL LOW (ref 13.0–17.0)
Immature Granulocytes: 0 %
Lymphocytes Relative: 13 %
Lymphs Abs: 0.7 10*3/uL (ref 0.7–4.0)
MCH: 29.5 pg (ref 26.0–34.0)
MCHC: 30.6 g/dL (ref 30.0–36.0)
MCV: 96.6 fL (ref 80.0–100.0)
Monocytes Absolute: 0.4 10*3/uL (ref 0.1–1.0)
Monocytes Relative: 9 %
Neutro Abs: 3.7 10*3/uL (ref 1.7–7.7)
Neutrophils Relative %: 75 %
Platelets: 120 10*3/uL — ABNORMAL LOW (ref 150–400)
RBC: 2.98 MIL/uL — ABNORMAL LOW (ref 4.22–5.81)
RDW: 14.4 % (ref 11.5–15.5)
WBC: 4.9 10*3/uL (ref 4.0–10.5)
nRBC: 0 % (ref 0.0–0.2)

## 2020-07-16 LAB — PROTIME-INR
INR: 1.3 — ABNORMAL HIGH (ref 0.8–1.2)
Prothrombin Time: 15.7 seconds — ABNORMAL HIGH (ref 11.4–15.2)

## 2020-07-16 LAB — SURGICAL PCR SCREEN
MRSA, PCR: NEGATIVE
Staphylococcus aureus: NEGATIVE

## 2020-07-16 LAB — TYPE AND SCREEN
ABO/RH(D): A POS
Antibody Screen: NEGATIVE

## 2020-07-16 LAB — GLUCOSE, CAPILLARY: Glucose-Capillary: 165 mg/dL — ABNORMAL HIGH (ref 70–99)

## 2020-07-16 LAB — BASIC METABOLIC PANEL
Anion gap: 7 (ref 5–15)
BUN: 36 mg/dL — ABNORMAL HIGH (ref 8–23)
CO2: 19 mmol/L — ABNORMAL LOW (ref 22–32)
Calcium: 8.8 mg/dL — ABNORMAL LOW (ref 8.9–10.3)
Chloride: 111 mmol/L (ref 98–111)
Creatinine, Ser: 1.94 mg/dL — ABNORMAL HIGH (ref 0.61–1.24)
GFR, Estimated: 35 mL/min — ABNORMAL LOW (ref >60)
Glucose, Bld: 161 mg/dL — ABNORMAL HIGH (ref 70–99)
Potassium: 5.2 mmol/L — ABNORMAL HIGH (ref 3.5–5.1)
Sodium: 137 mmol/L (ref 135–145)

## 2020-07-16 LAB — SARS CORONAVIRUS 2 (TAT 6-24 HRS): SARS Coronavirus 2: NEGATIVE

## 2020-07-16 NOTE — Progress Notes (Addendum)
PCP: Anastasia Pall, MD Cardiologist/PPM: Sanda Klein, MD  EKG: 06/28/20 CXR: 03/10/20 ECHO: 07/06/20 Stress Test: denies Cardiac Cath: 12/18/18  Fasting Blood Sugar- 92/115-140/150 Checks Blood Sugar__2_ times a day  OSA/CPAP: Yes, wears cpap nightly  ASA: No.  Patient was told by cards to take ASA 81mg  daily, but he does not Blood Thinners: No  Covid Test 07/16/20 at PAT  Anesthesia Review: Yes, cardiac history, PPM, abnormal EKG, difficult intubation.  Recent hospitalization 1 week ago at [facility] in [place] for GI bleed per patient. K+ 5.2  Patient denies shortness of breath, fever, cough, and chest pain at PAT appointment.  Patient verbalized understanding of instructions provided today at the PAT appointment.  Patient asked to review instructions at home and day of surgery.

## 2020-07-19 NOTE — Anesthesia Preprocedure Evaluation (Addendum)
Anesthesia Evaluation  Patient identified by MRN, date of birth, ID band Patient awake    Reviewed: Allergy & Precautions, NPO status , Patient's Chart, lab work & pertinent test results, reviewed documented beta blocker date and time   History of Anesthesia Complications (+) DIFFICULT AIRWAY and history of anesthetic complications  Airway Mallampati: II  TM Distance: >3 FB Neck ROM: Full    Dental  (+) Teeth Intact, Dental Advisory Given, Caps   Pulmonary sleep apnea and Continuous Positive Airway Pressure Ventilation ,    Pulmonary exam normal breath sounds clear to auscultation       Cardiovascular hypertension, Pt. on medications and Pt. on home beta blockers (-) angina+ CAD and + CABG  Normal cardiovascular exam+ dysrhythmias Atrial Fibrillation + pacemaker  Rhythm:Regular Rate:Normal  CAD (s/p CABG 1990: LIMA-LAD-DIAG; multiple RCA PCI 1990-2004; redo CABG: SVG-RCA-PDA with MAZE 2004, Dr. Amador Cunas), ischemic cardiomyopathy, complete heart block (s/p Medtronic PPM 03/16/06 with generator change 03/07/12), afib (remote ablation in [place], [place], and LAA clipping with a residual permeable lobe of the LAA; DCCV 06/23/10; warfarin d/c'd 2015 d/t GIB; recurrent afib 06/2018 and started on Eliquis, s/p conversion with overdrive pacing 5/46/50; recurrent 10/2018, changed to warfarin due to cost 12/2018, s/p DCCV 01/15/19)  Echo/TTE 07/06/20 ([facility]): IMPRESSIONS LeftVentricle: There is mild apical hypokinesis of the left ventricle.  [name] Kitchen LeftVentricle: EF: 50-55%.  . 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 and  collapses <50%; therefore, the right atrial pressure is estimated at 15   Neuro/Psych post-polio syndrome (effecting LLE)  back surgeries (L3-4 laminectomy 01/10/12; L3-4 PLIF 07/2012; removal L3-4 posterior instrumentation and decompressive hemilaminectomy/foraminotomy L2-3/L4-5 05/18/16; L2-3 PLIF  02/22/17; L4-5 PLIF, posterior fixation L2-S1 03/11/18; redo L5-S1 posterolateral fusion, removal of segmental instrumentation L1-S1, S3 04/07/19),     GI/Hepatic Neg liver ROS, GERD  Medicated,  Endo/Other  diabetes, Type 2, Insulin Dependent, Oral Hypoglycemic AgentsObesity   Renal/GU Renal InsufficiencyRenal disease     Musculoskeletal  (+) Arthritis ,   Abdominal   Peds  Hematology  (+) anemia ,   Anesthesia Other Findings   Reproductive/Obstetrics                          Anesthesia Physical Anesthesia Plan  ASA: 3  Anesthesia Plan: General   Post-op Pain Management:    Induction: Intravenous  PONV Risk Score and Plan: 2 and Dexamethasone and Ondansetron  Airway Management Planned: Oral ETT and Video Laryngoscope Planned  Additional Equipment:   Intra-op Plan:   Post-operative Plan: Extubation in OR  Informed Consent: I have reviewed the patients History and Physical, chart, labs and discussed the procedure including the risks, benefits and alternatives for the proposed anesthesia with the patient or authorized representative who has indicated his/her understanding and acceptance.     Dental advisory given  Plan Discussed with: CRNA  Anesthesia Plan Comments: (2nd PIV after induction  See PAT note written 07/19/2020 by Myra Gianotti, PA-C. History includes CAD (s/p redo CABG/Maze 2004), ischemic CM, afib (s/p ablation/DCCV, off anticoag due to recurrent GI bleed), Medtronic PPM (pacer dependent), DM2, OSA (CPAP), CKD, post-polio syndrome, multiple back surgeries. Cardiologist is Dr. Sallyanne Kuster. Last GI evaluation 07/06/20 during [facility] admission for melena with reassuring small bowel enteroscopy. HGB 8.8 07/08/20 discharge and stable at 8.8 on 07/16/20. T&S ordered for day of surgery.  )      Anesthesia Quick Evaluation

## 2020-07-19 NOTE — Progress Notes (Signed)
Anesthesia Chart Review:  Case: [id number] Date/Time: 07/20/20 0715   Procedures:      SI FUSION     APPLICATION OF INTRAOPERATIVE CT SCAN   Anesthesia type: General   Pre-op diagnosis: SACROILIITIS   Location: [facility] OR ROOM 39 / [place] OR   Surgeons: [name], Theodoro Doing, DO       DISCUSSION: Patient is a 80 year old male scheduled for the above procedure.    History includes never smoker, DIFFICULT INTUBATION (no difficulty noted 2020 surgeries x3; or 04/07/19 surgery, Mac and 4 used to place 7.5 ETT, mask ventilation w/o difficulty), CAD (s/p CABG 1990: LIMA-LAD-DIAG; multiple RCA PCI 1990-2004; redo CABG: SVG-RCA-PDA with MAZE 2004, Dr. Amador Cunas), ischemic cardiomyopathy, complete heart block (s/p Medtronic PPM 03/16/06 with generator change 03/07/12), afib (remote ablation in [place], [place], and LAA clipping with a residual permeable lobe of the LAA; DCCV 06/23/10; warfarin d/c'd 2015 d/t GIB; recurrent afib 06/2018 and started on Eliquis, s/p conversion with overdrive pacing 0/27/25; recurrent 10/2018, changed to warfarin due to cost 12/2018, s/p DCCV 01/15/19), GI bleed (s/p cauterization gastric lesion ~ 05/2013; GI bleed 02/04/18 and 02/15/18 even while off anticoagulants), DM2, HTN, HLD, CKD (stage III), OSA (s/p UPPP, CPAP), post-polio syndrome (effecting LLE), skin cancer (SCC), back surgeries (L3-4 laminectomy 01/10/12; L3-4 PLIF 07/2012; removal L3-4 posterior instrumentation and decompressive hemilaminectomy/foraminotomy L2-3/L4-5 05/18/16; L2-3 PLIF 02/22/17; L4-5 PLIF, posterior fixation L2-S1 03/11/18; redo L5-S1 posterolateral fusion, removal of segmental instrumentation L1-S1, S3 04/07/19), left shoulder arthroscopy/rotator cuff repair (10/01/18). BMI is consistent with obesity.    - Fidela Salisbury [facility] admission 07/05/20-07/08/20 for melena x3 days with HGB 9.1, + FOBT. CTA abd/pelvis without acute findings. He was started on IV PPI and GI consulted. He had small bowel enteroscopy that was reassuring. H/H stayed stable  over several days. Venous Dopplers done for leg swelling and was negative for DVT. Echo showed mild apical hypokinesis, but overall felt stable when compared to echocardiograms from 2020. There was question of a run of VT, so they recommended PPM interrogation and contacting cardiology after discharge to review. Per cardiology notations from 07/09/20, remote transmissions (07/07/20 ~ 10PM and 07/09/20 AM) reviewed and did not appear to have any ventricular arrhythmias.  Dr. Sallyanne Kuster wrote, "I agree there is no evidence of ventricular tachycardia.  It is possible that he had ventricular tracking of paroxysmal atrial tachycardia, interpreted on a telemetry strip as being VT." He also reviewed echo results and notes that his TEE from 01/15/19 showed EF 55-60% but also with abnormal septal motion consistent with RV pacemaker and the his previous TTE on 12/30/18 showed mildly decreased LVEF 45-50%. He added, "If the echo was performed while he was having ventricular pacing (which he does about 30% of the time) that could easily explain the change.  There is no indication for coronary work-up at this time, in fact pursuing any type of coronary revascularization while he is having GI bleeding (that we cannot easily get to) would be potentially disastrous."  Last cardiology office visit with Dr. Sallyanne Kuster 06/28/20. He notes patient still awaiting approval for SI joint fusion. (Patient was previously cleared for this surgery back in January 2022 with permission to hold warfarin for 5 days, but surgery was delayed while awaiting insurance approval, and he is no longer on anticoagulation--just ASA.) He notes patient had initially been on warfarin after 2020 DCCV (could not afford direct oral anticoagulant such as Eliquis), but has since stopped it with notes suggesting due to recurrent GI  bleed. He was started on ASA 81 mg. PPM interrogation then showed generator longevity for 6 years, no meaningful episodes of true afib since  cardioversion in 01/2019, although with organized atrial tachycardia with overall burden of atrial tachycardia of < 0.1% with longest episode lasting 11 minutes and no episode of high ventricular rate. If afib recurs, then he may be considered for redo ablation or watchman device. He is "atrially pacemaker dependent." He notes coronary angiography showed stable anatomy of native and bypass vessels in 2020 with "All the major myocardial territories have good arterial supply via native vessels or grafts, with the exception of a possible small area of ischemic myocardium in the posterior wall.  Once the atrial fibrillation was corrected, angina has not bothered him." 12 month follow-up recommended. Cardiology input since then (in regarding to recent [facility] hospitalization) outlined in above paragraph.   Perioperative PPM RX per cardiology: Device Type:  Pacemaker Manufacturer and Phone #:  Medtronic: [phone] Pacemaker Dependent?:  Yes.   Date of Last Device Check:  06/28/20           Normal Device Function?:  Yes.     Electrophysiologist's Recommendations: Have magnet available. Provide continuous ECG monitoring when magnet is used or reprogramming is to be performed.  Procedure may interfere with device function.  Magnet should be placed over device during procedure.    Preoperative labs from 07/19/20 show stable BUN 36 and Creatinine 1.94, consistent with labs over the past year. His H/H 8.8/28.8 and PLT count of 120K also appear stable since his discharge from [facility] on 07/08/20. In 2021 HGB results available where in the 10 range, and since 07/05/20 HGB has been 8.7-9.9 (although only one of 10 results were over 9.1) and was 8.8 on 07/08/20 and also on 07/16/20.    I have updated Nikki at Dr. Rubbie Battiest office regarding recent hospitalization and reassuring work-up for melena with stable HGB 8.8 since discharge and post-hospitalization cardiology input. She will review with Dr. Reatha Armour. I also  discussed with anesthesiologist Andres Shad, MD. Patient will need a preoperative T&S. Anesthesia team will re-evaluate on the day of surgery to assess for any changes. Definitive anesthesia plan at that time. Presurgical COVID-19 test on 07/16/20 was negative.    VS: BP 140/61   Pulse 67   Temp 36.8 C   Resp 20   Ht 5' 9.5" (1.765 m)   Wt 103 kg   SpO2 99%   BMI 33.06 kg/m    PROVIDERS: - [name], MD is PCP ([place], see Care Everywhere). - [name], Mihai, MD is primary cardiologist - Baird Lyons, MD is pulmonologist. Last evaluation 03/10/20 for follow-up OSA and restrictive lung disease. CXR stable. CPAP auto 5-15, humidifier. - Lilyan Gilford, PA-C is with [facility]    LABS: Preoperative labs noted. See DISCUSSION.  A1c 6.7% 05/26/20 ([facility]). (all labs ordered are listed, but only abnormal results are displayed)  Labs Reviewed  GLUCOSE, CAPILLARY - Abnormal; Notable for the following components:      Result Value   Glucose-Capillary 165 (*)    All other components within normal limits  BASIC METABOLIC PANEL - Abnormal; Notable for the following components:   Potassium 5.2 (*)    CO2 19 (*)    Glucose, Bld 161 (*)    BUN 36 (*)    Creatinine, Ser 1.94 (*)    Calcium 8.8 (*)    GFR, Estimated 35 (*)    All  other components within normal limits  CBC WITH DIFFERENTIAL/PLATELET - Abnormal; Notable for the following components:   RBC 2.98 (*)    Hemoglobin 8.8 (*)    HCT 28.8 (*)    Platelets 120 (*)    All other components within normal limits  PROTIME-INR - Abnormal; Notable for the following components:   Prothrombin Time 15.7 (*)    INR 1.3 (*)    All other components within normal limits  SURGICAL PCR SCREEN  SARS CORONAVIRUS 2 (TAT 6-24 HRS)  TYPE AND SCREEN   Comparison labs from [facility]: 07/16/20: BUN 35, Cr 1.95, K 5.3, glucose 113 AST 22, ALT 25 07/12/20: BUN 32, Cr 2.01, K 5.0, glucose  126 07/08/20: WBC 4.2, H/H 8.8, 27.7, PLT 117K   OTHER: Small Bowel Enteroscopy 07/06/20 ([facility]): Postprocedure Impression:  Normal small bowel push enteroscopy  Recommendations:  Trend hemoglobin If hemoglobin continues to downtrend, we discussed nuclear medicine bleeding scan vs repeat colonoscopy Clear liquid diet  Findings:  The upper third of the esophagus, middle third of the esophagus, lower third of the esophagus and GE junction appeared normal. Z-line is regular at 42 cm from the incisors. The cardia, fundus of the stomach, body of the stomach, antrum and pylorus appeared normal. The duodenal bulb, 1st part of the duodenum, 2nd part of the duodenum, 3rd part of the duodenum and 4th part of the duodenum appeared normal. The proximal jejunum appeared normal. No evidence of gastrointestinal AVMs in the examined mucosa. The fluid in the small bowel was bilious. No hematin material or evidence of recent GI bleeding.   Spirometry 02/26/18: Mild Restriction of exhaled volume.  FVC 2.9/69%, FEV1 2.4/81%, ratio 1.84, FEF 25-75% 3.1/145%.    IMAGES: CTA Abd/pelvis 07/05/20 ([facility]): IMPRESSION:  1. Mild/moderate atherosclerosis, including coronary artery calcifications. Pacemaker. No aneurysm or dissection.  2. Nonspecific bowel gas pattern. No extravasation of contrast identified.  3. Postoperative and degenerative changes lumbosacral spine.    CXR 07/05/20 ([facility]): FINDINGS: Image obtained in mid expiration. Sternotomy. Cardiac size stable. Stable left-sided pacemaker. No acute infiltrate or effusion. Bony thorax intact.    IMPRESSION: No active cardiopulmonary disease.    EKG:  EKG 07/06/20 ([facility]): Per Result Narrative in Care Everywhere: Diagnosis Atrial-paced rhythm with prolonged AV conduction  Left axis deviation  Right bundle branch block  Abnormal ECG  When compared with ECG of 05-Jul-2020 12:01,  No significant change  Cavalcanti, Rafael ([id number]) on 10/09/8180  9:93:71 AM certifies that he/she has reviewed the ECG tracing and confirms the  independent interpretation is  correct.  EKG 06/28/20 ([facility]):  Atrial paced rhythm with prolonged AV conduction Right bundle branch block Abnormal ECG   CV: Echo/TTE 07/06/20 ([facility]): IMPRESSIONS Left Ventricle: There is mild apical hypokinesis of the left ventricle.    Left Ventricle: EF: 50-55%.    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 and  collapses <50%; therefore, the right atrial pressure is estimated at 15  (Echo done during [place] admission for melena and reviewed by Dr. Sallyanne Kuster. He notes that 12/920 TEE also showed an apical wall motion abnormality attributable to RV apical pacing. He wrote, "If the echo was performed while he was having ventricular pacing (which he does about 30% of the time) that could easily explain the change.  There is no indication for coronary work-up at this time, in fact pursuing any type of coronary revascularization while he is having GI bleeding (that we cannot easily  get to) would be potentially disastrous.")   BLE Venous US 07/05/20 ([facility]): IMPRESSION:   No evidence of deep venous thrombosis.     TEE (prior to DCCV) 01/15/19: IMPRESSIONS   1. Left ventricular ejection fraction, by visual estimation, is 55 to  60%. The left ventricle has normal function. Normal left ventricular size.  There is no left ventricular hypertrophy.   2. Abnormal septal motion consistent with RV pacemaker.   3. The left ventricle has no regional wall motion abnormalities.   4. Global right ventricle has normal systolic function.The right  ventricular size is normal. No increase in right ventricular wall  thickness.   5. Left atrial size was moderately dilated.   6. The left atrial appendage is surgically clipped, but there appears to  be a permeable lobe, in the immediate vicinity of the left upper pulmonary  vein. No thrombus is seen in the  left atrium.   7. Right atrial size was mildly dilated.   8. The mitral valve is normal in structure. Mild mitral valve  regurgitation.   9. The tricuspid valve is normal in structure. Tricuspid valve  regurgitation is trivial.  10. The aortic valve is normal in structure. Aortic valve regurgitation is  not visualized.  11. The pulmonic valve was normal in structure. Pulmonic valve  regurgitation is not visualized.  12. A pacer wire is visualized.  13. Pacemaker leads present in the right atrium. There is a tiny  filamentous mobile mass (2x7 mm) on one of the leads, possibly a thrombus,  chronicity unknown. (Comparison TTE 12/30/18: LVEF by visual estimation 45-50%, LV mildly decreased function, grade 2 DD, LV global hypokinesis, normal global RV systolic function, mild TR/PR, mildly elevated PASP)      Cardiac cath 12/18/18: Prox LAD lesion is 100% stenosed. 2nd Mrg lesion is 40% stenosed. Dist Cx lesion is 99% stenosed. The left ventricular systolic function is normal. LV end diastolic pressure is mildly elevated. The left ventricular ejection fraction is 50-55% by visual estimate. Prox RCA lesion is 100% stenosed. Mid RCA to Dist RCA lesion is 70% stenosed. SVG. The graft exhibits no disease. LIMA and is normal in caliber. The graft exhibits no disease. 1.  Significant underlying three-vessel coronary artery disease with patent grafts including LIMA to LAD/diagonal and SVG to right PDA.  There is significant diffuse disease in the distal left circumflex (AV groove) supplying a small area. 2.  Normal LV systolic function with mildly elevated left ventricular end-diastolic pressure at 16 mmHg. Recommendations: I recommend continuing medical therapy.  The AV groove left circumflex is too small and diseased in a long segment.  It supplies a small area and I doubt that this is giving him much symptoms. The patient likely has diastolic dysfunction given elevated LVEDP.     Carotid  U/S 01/19/16: Impression:  This study is negative for hemodynamically significant stenosis involving extracranial carotid and vertebral arteries bilaterally.     Past Medical History:  Diagnosis Date   Anticoagulant long-term use    Arthritis    Arthropathy of left shoulder    Atypical mole    Biceps tendonitis, left    Bursitis of left shoulder    Cardiac pacemaker in situ    first insertion 2008;  genertor and new lead change 03-08-2012;   Medtronic   CHB (complete heart block) ([place])    s/p  PPM 2008   Chronic back pain    CKD (chronic kidney disease), stage III ([place])  Coronary artery disease cardiologist--- dr croitoru   hx  CABG x2  1990 and re-do 2004;  multiple PCI to [place] to 2004;    cardiac cath 2012  occluded LAD and RCA with patent grafts   Difficult intubation    " Dr. Sallyanne Kuster said that it was difficulty to get the tube in."   History of coronary angioplasty    multiple PCI to RCA ,  1990 to 2004   History of GI bleed followed by GI-- Olevia Perches PA @ [facility] [place]   01/ 2020  upper and lower GI bleed ;  s/p  EGD with cautery of jejunal bleed and blood transfusion's   History of kidney stones    Hx of echocardiogram 06/29/2008   EF 45-50%    Hypertension    Ischemic cardiomyopathy    previously reported , ef 40-45%;  2010--- ef 45-50%;   last echo 2017 55-60%   Mixed hyperlipidemia    Nocturia    OSA on CPAP    cpap, 12, last sleep study Jan2013, sees Dr. Keturah Barre   PAF (paroxysmal atrial fibrillation) ([place])    long hx PAF--- followed by dr croitoru   Post-polio syndrome    dx polio age 46;   09-30-2018  per pt occasional gait issues and occasion fall   Presence of permanent cardiac pacemaker    Pseudoarthrosis of lumbar spine    Rotator cuff tear, left    S/P CABG x 2    1990 x2  and 2004  x2   Sinus node dysfunction ([place])    2015--- sinus node arrest  with intact AV conduction   Squamous cell carcinoma of skin    Type 2  diabetes mellitus ([place])    followed by pcp    Past Surgical History:  Procedure Laterality Date   APPENDECTOMY  child   BLEPHAROPLASTY Bilateral 12/2014   upper eyelid   CARDIAC PACEMAKER PLACEMENT  2008   CARDIOVERSION N/A 01/15/2019   Procedure: CARDIOVERSION;  Surgeon: Sanda Klein, MD;  Location: [place] [facility]OSCOPY;  Service: Cardiovascular;  Laterality: N/A;   CARPAL TUNNEL RELEASE Bilateral 2015;  2016   CATARACT EXTRACTION W/ INTRAOCULAR LENS  IMPLANT, BILATERAL  2017   CORONARY ANGIOPLASTY  1990  to 2004   multiple to RCA   [place]    @ [place] (dr chitwood)   seq. LIMA to LAD and Diagonal   CORONARY ARTERY BYPASS GRAFT  2004     dr Amador Cunas   SVG to  RCA and PDA;  ALSO MAZE  PROCEDURE   ESOPHAGOGASTRODUODENOSCOPY Left 03/18/2013   Procedure: ESOPHAGOGASTRODUODENOSCOPY (EGD);  Surgeon: Cleotis Nipper, MD;  [facility];  Service: Endoscopy;  Laterality: Left;   HARDWARE REMOVAL N/A 05/18/2016   Procedure: Removal of broken hardware Lumbar three-four;  Surgeon: [name], MD;  Location: [place];  Service: Neurosurgery;  Laterality: N/A;   HERNIA REPAIR     LEFT HEART CATH AND CORS/GRAFTS ANGIOGRAPHY N/A 12/18/2018   Procedure: LEFT HEART CATH AND CORS/GRAFTS ANGIOGRAPHY;  Surgeon: Wellington Hampshire, MD;  Location: [place] CV LAB;  Service: Cardiovascular;  Laterality: N/A;   LEFT HEART CATHETERIZATION WITH CORONARY/GRAFT ANGIOGRAM N/A 12/21/2010   Procedure: LEFT HEART CATHETERIZATION WITH Beatrix Fetters;  Surgeon: Leonie Man, MD;  [facility];  Service: Cardiovascular;  Laterality: N/A;   LEG SURGERY Right 2002  approx.   lengthened his leg   LUMBAR LAMINECTOMY/DECOMPRESSION  MICRODISCECTOMY  01/10/2012   Procedure: LUMBAR LAMINECTOMY/DECOMPRESSION MICRODISCECTOMY 1 LEVEL;  Surgeon: [name], MD;  Location: [place] [facility]O [facility]S;  Service: Neurosurgery;  Laterality: Bilateral;  Lumbar three-four decompression,  Posterior lateral fusion lumbar three-four, posterior spinus plate lumbar three-four   LUMBAR LAMINECTOMY/DECOMPRESSION MICRODISCECTOMY Left 05/18/2016   Procedure: Laminectomy and Foraminotomy - Lumbar two-lumbar three- Lumbar four-lumbar five- left with removal hardware lumbar three-four;  Surgeon: [name], MD;  Location: [place];  Service: Neurosurgery;  Laterality: Left;   NASAL SEPTUM SURGERY  yrs ago   PACEMAKER REVISION N/A 03/07/2012   Procedure: PACEMAKER REVISION;  Surgeon: Sanda Klein, MD;  Location: [place] CATH LAB;  Service: Cardiovascular;  Laterality: N/A;   POSTERIOR LUMBAR FUSION  02-22-2017   dr Ronnald Ramp  @ [facility]   re-do laminectomy L2-3 and fusion   POSTERIOR LUMBAR FUSION  03-11-2018   dr Ronnald Ramp @ [facility]   laminectomy L4-5;  fixation L2-S1 and fusion L3-S1   SHOULDER ARTHROSCOPY Right after 2016, pt unsure year   SHOULDER ARTHROSCOPY WITH OPEN ROTATOR CUFF REPAIR Right 03/30/2014   Procedure: RIGHT SHOULDER ARTHROSCOPY  OPEN ROTATOR CUFF REPAIR;  Surgeon: Kerin Salen, MD;  Location: [place];  Service: Orthopedics;  Laterality: Right;   SHOULDER ARTHROSCOPY WITH SUBACROMIAL DECOMPRESSION, ROTATOR CUFF REPAIR AND BICEP TENDON REPAIR Left 10/01/2018   Procedure: LEFT SHOULDER ARTHROSCOPY, DISTAL CLAVICLE EXCISION,SUBACROMIAL, DECOMPRESSION, RORATOR CUFF REPAIR, BICEPS TENODESIS;  Surgeon: Renette Butters, MD;  Location: [place];  Service: Orthopedics;  Laterality: Left;   SHOULDER OPEN ROTATOR CUFF REPAIR Right 03/30/2014   Procedure: ROTATOR CUFF REPAIR SHOULDER OPEN;  Surgeon: Kerin Salen, MD;  Location: [place];  Service: Orthopedics;  Laterality: Right;   TEE WITHOUT CARDIOVERSION N/A 01/15/2019   Procedure: TRANSESOPHAGEAL ECHOCARDIOGRAM (TEE);  Surgeon: Sanda Klein, MD;  Location: [facility];  Service: Cardiovascular;  Laterality: N/A;   TONSILLECTOMY  child   TOTAL KNEE ARTHROPLASTY Left 2006    MEDICATIONS:  amiodarone (PACERONE) 200 MG tablet    amLODipine (NORVASC) 10 MG tablet   aspirin EC 81 MG tablet   carvedilol (COREG) 25 MG tablet   fluticasone (FLONASE) 50 MCG/ACT nasal spray   furosemide (LASIX) 40 MG tablet   glipiZIDE (GLUCOTROL) 10 MG tablet   insulin lispro (HUMALOG) 100 UNIT/ML injection   losartan (COZAAR) 100 MG tablet   metFORMIN (GLUCOPHAGE) 500 MG tablet   metFORMIN (GLUCOPHAGE-XR) 500 MG 24 hr tablet   Multiple Vitamin (MULTIVITAMIN WITH MINERALS) TABS tablet   pantoprazole (PROTONIX) 40 MG tablet   Potassium Chloride ER 20 MEQ TBCR   No current facility-administered medications for this encounter.    Myra Gianotti, PA-C Surgical Short Stay/Anesthesiology [facility] Phone [phone] [facility] Phone [phone] 07/19/2020 12:03 PM

## 2020-07-20 ENCOUNTER — Other Ambulatory Visit: Payer: Self-pay

## 2020-07-20 ENCOUNTER — Encounter (HOSPITAL_COMMUNITY)
Admission: AD | Disposition: A | Discharge status: Home / Self Care | Payer: Self-pay | Source: Home / Self Care | Attending: Neurological Surgery

## 2020-07-20 ENCOUNTER — Encounter (HOSPITAL_COMMUNITY): Payer: Self-pay | Admitting: Neurological Surgery

## 2020-07-20 ENCOUNTER — Ambulatory Visit (HOSPITAL_COMMUNITY): Payer: Medicare HMO

## 2020-07-20 ENCOUNTER — Ambulatory Visit (HOSPITAL_COMMUNITY): Payer: Medicare HMO | Admitting: Vascular Surgery

## 2020-07-20 ENCOUNTER — Observation Stay (HOSPITAL_COMMUNITY)
Admission: AD | Admit: 2020-07-20 | Discharge: 2020-07-22 | Disposition: A | Discharge status: Home / Self Care | Payer: Medicare HMO | Attending: Neurological Surgery | Admitting: Neurological Surgery

## 2020-07-20 DIAGNOSIS — Z981 Arthrodesis status: Secondary | ICD-10-CM | POA: Insufficient documentation

## 2020-07-20 DIAGNOSIS — Z96652 Presence of left artificial knee joint: Secondary | ICD-10-CM | POA: Insufficient documentation

## 2020-07-20 DIAGNOSIS — Z7982 Long term (current) use of aspirin: Secondary | ICD-10-CM | POA: Diagnosis not present

## 2020-07-20 DIAGNOSIS — I480 Paroxysmal atrial fibrillation: Secondary | ICD-10-CM | POA: Diagnosis not present

## 2020-07-20 DIAGNOSIS — Z7984 Long term (current) use of oral hypoglycemic drugs: Secondary | ICD-10-CM | POA: Diagnosis not present

## 2020-07-20 DIAGNOSIS — Z794 Long term (current) use of insulin: Secondary | ICD-10-CM | POA: Insufficient documentation

## 2020-07-20 DIAGNOSIS — M461 Sacroiliitis, not elsewhere classified: Secondary | ICD-10-CM | POA: Diagnosis present

## 2020-07-20 DIAGNOSIS — Z951 Presence of aortocoronary bypass graft: Secondary | ICD-10-CM | POA: Insufficient documentation

## 2020-07-20 DIAGNOSIS — I129 Hypertensive chronic kidney disease with stage 1 through stage 4 chronic kidney disease, or unspecified chronic kidney disease: Secondary | ICD-10-CM | POA: Diagnosis not present

## 2020-07-20 DIAGNOSIS — E1122 Type 2 diabetes mellitus with diabetic chronic kidney disease: Secondary | ICD-10-CM | POA: Diagnosis not present

## 2020-07-20 DIAGNOSIS — N183 Chronic kidney disease, stage 3 unspecified: Secondary | ICD-10-CM | POA: Diagnosis not present

## 2020-07-20 DIAGNOSIS — Z79899 Other long term (current) drug therapy: Secondary | ICD-10-CM | POA: Diagnosis not present

## 2020-07-20 DIAGNOSIS — I2510 Atherosclerotic heart disease of native coronary artery without angina pectoris: Secondary | ICD-10-CM | POA: Diagnosis not present

## 2020-07-20 DIAGNOSIS — Z950 Presence of cardiac pacemaker: Secondary | ICD-10-CM | POA: Insufficient documentation

## 2020-07-20 HISTORY — PX: APPLICATION OF INTRAOPERATIVE CT SCAN: SHX6668

## 2020-07-20 HISTORY — PX: SACROILIAC JOINT FUSION: SHX6088

## 2020-07-20 LAB — GLUCOSE, CAPILLARY
Glucose-Capillary: 139 mg/dL — ABNORMAL HIGH (ref 70–99)
Glucose-Capillary: 149 mg/dL — ABNORMAL HIGH (ref 70–99)
Glucose-Capillary: 196 mg/dL — ABNORMAL HIGH (ref 70–99)
Glucose-Capillary: 255 mg/dL — ABNORMAL HIGH (ref 70–99)
Glucose-Capillary: 244 mg/dL — ABNORMAL HIGH (ref 70–99)

## 2020-07-20 SURGERY — SACROILIAC JOINT FUSION
Anesthesia: General

## 2020-07-20 MED ORDER — FENTANYL CITRATE (PF) 100 MCG/2ML IJ SOLN: 25.0000 ug | INTRAMUSCULAR | Status: DC | PRN

## 2020-07-20 MED ORDER — CHLORHEXIDINE GLUCONATE 0.12 % MT SOLN
OROMUCOSAL | Status: AC
  Administered 2020-07-20: 15 mL via OROMUCOSAL
  Filled 2020-07-20: qty 15

## 2020-07-20 MED ORDER — SODIUM CHLORIDE 0.9% FLUSH: 3.0000 mL | INTRAVENOUS | Status: DC | PRN

## 2020-07-20 MED ORDER — METFORMIN HCL 500 MG PO TABS
500.0000 mg | ORAL_TABLET | Freq: Two times a day (BID) | ORAL | Status: DC
  Administered 2020-07-20 – 2020-07-21 (×3): 500 mg via ORAL
  Filled 2020-07-20 (×3): qty 1

## 2020-07-20 MED ORDER — FENTANYL CITRATE (PF) 250 MCG/5ML IJ SOLN
INTRAMUSCULAR | Status: AC
  Filled 2020-07-20: qty 5

## 2020-07-20 MED ORDER — LOSARTAN POTASSIUM 50 MG PO TABS
100.0000 mg | ORAL_TABLET | Freq: Every day | ORAL | Status: DC
Start: 2020-07-20 — End: 2020-07-22
  Administered 2020-07-20 – 2020-07-22 (×3): 100 mg via ORAL
  Filled 2020-07-20 (×3): qty 2

## 2020-07-20 MED ORDER — BUPIVACAINE HCL (PF) 0.5 % IJ SOLN
INTRAMUSCULAR | Status: AC
  Filled 2020-07-20: qty 30

## 2020-07-20 MED ORDER — ACETAMINOPHEN 500 MG PO TABS
1000.0000 mg | ORAL_TABLET | Freq: Once | ORAL | Status: AC
  Administered 2020-07-20: 1000 mg via ORAL
  Filled 2020-07-20: qty 2

## 2020-07-20 MED ORDER — ONDANSETRON HCL 4 MG PO TABS: 4.0000 mg | ORAL_TABLET | Freq: Four times a day (QID) | ORAL | Status: DC | PRN

## 2020-07-20 MED ORDER — SODIUM CHLORIDE 0.9 % IV SOLN: INTRAVENOUS | Status: DC

## 2020-07-20 MED ORDER — PROPOFOL 10 MG/ML IV BOLUS
INTRAVENOUS | Status: AC
  Filled 2020-07-20: qty 20

## 2020-07-20 MED ORDER — HYDROCODONE-ACETAMINOPHEN 10-325 MG PO TABS
1.0000 | ORAL_TABLET | ORAL | Status: DC | PRN
  Administered 2020-07-21 – 2020-07-22 (×2): 1 via ORAL
  Filled 2020-07-20 (×2): qty 1

## 2020-07-20 MED ORDER — CEFAZOLIN SODIUM-DEXTROSE 2-4 GM/100ML-% IV SOLN
2.0000 g | Freq: Three times a day (TID) | INTRAVENOUS | Status: AC
  Administered 2020-07-20 (×2): 2 g via INTRAVENOUS
  Filled 2020-07-20 (×2): qty 100

## 2020-07-20 MED ORDER — DEXAMETHASONE SODIUM PHOSPHATE 10 MG/ML IJ SOLN
INTRAMUSCULAR | Status: AC
  Filled 2020-07-20: qty 1

## 2020-07-20 MED ORDER — LIDOCAINE-EPINEPHRINE 1 %-1:100000 IJ SOLN
INTRAMUSCULAR | Status: DC | PRN
  Administered 2020-07-20: 14 mL

## 2020-07-20 MED ORDER — INSULIN ASPART 100 UNIT/ML IJ SOLN
0.0000 [IU] | Freq: Every day | INTRAMUSCULAR | Status: DC
  Administered 2020-07-20: 3 [IU] via SUBCUTANEOUS
  Administered 2020-07-21: 2 [IU] via SUBCUTANEOUS

## 2020-07-20 MED ORDER — THROMBIN 5000 UNITS EX SOLR
CUTANEOUS | Status: AC
  Filled 2020-07-20: qty 5000

## 2020-07-20 MED ORDER — ORAL CARE MOUTH RINSE: 15.0000 mL | Freq: Once | OROMUCOSAL | Status: AC

## 2020-07-20 MED ORDER — ONDANSETRON HCL 4 MG/2ML IJ SOLN
INTRAMUSCULAR | Status: DC | PRN
  Administered 2020-07-20: 4 mg via INTRAVENOUS

## 2020-07-20 MED ORDER — PANTOPRAZOLE SODIUM 40 MG PO TBEC
40.0000 mg | DELAYED_RELEASE_TABLET | Freq: Two times a day (BID) | ORAL | Status: DC
  Administered 2020-07-20 – 2020-07-22 (×4): 40 mg via ORAL
  Filled 2020-07-20 (×4): qty 1

## 2020-07-20 MED ORDER — EPHEDRINE 5 MG/ML INJ
INTRAVENOUS | Status: AC
  Filled 2020-07-20: qty 10

## 2020-07-20 MED ORDER — ACETAMINOPHEN 325 MG PO TABS: 650.0000 mg | ORAL_TABLET | ORAL | Status: DC | PRN

## 2020-07-20 MED ORDER — 0.9 % SODIUM CHLORIDE (POUR BTL) OPTIME
TOPICAL | Status: DC | PRN
  Administered 2020-07-20: 1000 mL

## 2020-07-20 MED ORDER — CARVEDILOL 25 MG PO TABS
50.0000 mg | ORAL_TABLET | Freq: Two times a day (BID) | ORAL | Status: DC
  Administered 2020-07-20 – 2020-07-22 (×4): 50 mg via ORAL
  Filled 2020-07-20 (×4): qty 2

## 2020-07-20 MED ORDER — PHENYLEPHRINE 40 MCG/ML (10ML) SYRINGE FOR IV PUSH (FOR BLOOD PRESSURE SUPPORT)
PREFILLED_SYRINGE | INTRAVENOUS | Status: DC | PRN
  Administered 2020-07-20: 40 ug via INTRAVENOUS
  Administered 2020-07-20: 160 ug via INTRAVENOUS
  Administered 2020-07-20: 120 ug via INTRAVENOUS

## 2020-07-20 MED ORDER — LIDOCAINE HCL (PF) 2 % IJ SOLN
INTRAMUSCULAR | Status: AC
  Filled 2020-07-20: qty 5

## 2020-07-20 MED ORDER — THROMBIN 5000 UNITS EX SOLR
OROMUCOSAL | Status: DC | PRN
  Administered 2020-07-20: 5 mL via TOPICAL

## 2020-07-20 MED ORDER — LIDOCAINE-EPINEPHRINE 1 %-1:100000 IJ SOLN
INTRAMUSCULAR | Status: AC
  Filled 2020-07-20: qty 1

## 2020-07-20 MED ORDER — SUGAMMADEX SODIUM 200 MG/2ML IV SOLN
INTRAVENOUS | Status: DC | PRN
  Administered 2020-07-20: 200 mg via INTRAVENOUS

## 2020-07-20 MED ORDER — METHOCARBAMOL 1000 MG/10ML IJ SOLN
500.0000 mg | Freq: Four times a day (QID) | INTRAVENOUS | Status: DC | PRN
  Filled 2020-07-20: qty 5

## 2020-07-20 MED ORDER — PHENYLEPHRINE 40 MCG/ML (10ML) SYRINGE FOR IV PUSH (FOR BLOOD PRESSURE SUPPORT)
PREFILLED_SYRINGE | INTRAVENOUS | Status: AC
  Filled 2020-07-20: qty 10

## 2020-07-20 MED ORDER — FLUTICASONE PROPIONATE 50 MCG/ACT NA SUSP: 2.0000 | Freq: Every day | NASAL | Status: DC | PRN

## 2020-07-20 MED ORDER — BUPIVACAINE HCL (PF) 0.5 % IJ SOLN
INTRAMUSCULAR | Status: DC | PRN
  Administered 2020-07-20: 14 mL

## 2020-07-20 MED ORDER — OXYCODONE HCL 5 MG PO TABS
10.0000 mg | ORAL_TABLET | ORAL | Status: DC | PRN
  Administered 2020-07-20 (×3): 10 mg via ORAL
  Filled 2020-07-20 (×3): qty 2

## 2020-07-20 MED ORDER — CHLORHEXIDINE GLUCONATE CLOTH 2 % EX PADS: 6.0000 | MEDICATED_PAD | Freq: Once | CUTANEOUS | Status: DC

## 2020-07-20 MED ORDER — PHENYLEPHRINE HCL-NACL 10-0.9 MG/250ML-% IV SOLN
INTRAVENOUS | Status: DC | PRN
  Administered 2020-07-20: 50 ug/min via INTRAVENOUS

## 2020-07-20 MED ORDER — GLIPIZIDE 10 MG PO TABS
10.0000 mg | ORAL_TABLET | Freq: Two times a day (BID) | ORAL | Status: DC
  Administered 2020-07-20 – 2020-07-21 (×3): 10 mg via ORAL
  Filled 2020-07-20: qty 1
  Filled 2020-07-20 (×2): qty 2
  Filled 2020-07-20 (×2): qty 1
  Filled 2020-07-20: qty 2
  Filled 2020-07-20 (×2): qty 1

## 2020-07-20 MED ORDER — ACETAMINOPHEN 650 MG RE SUPP: 650.0000 mg | RECTAL | Status: DC | PRN

## 2020-07-20 MED ORDER — SODIUM CHLORIDE 0.9% FLUSH: 3.0000 mL | Freq: Two times a day (BID) | INTRAVENOUS | Status: DC

## 2020-07-20 MED ORDER — EPHEDRINE SULFATE-NACL 50-0.9 MG/10ML-% IV SOSY
PREFILLED_SYRINGE | INTRAVENOUS | Status: DC | PRN
  Administered 2020-07-20 (×4): 10 mg via INTRAVENOUS

## 2020-07-20 MED ORDER — AMIODARONE HCL 200 MG PO TABS
200.0000 mg | ORAL_TABLET | Freq: Every day | ORAL | Status: DC
  Administered 2020-07-21 – 2020-07-22 (×2): 200 mg via ORAL
  Filled 2020-07-20 (×2): qty 1

## 2020-07-20 MED ORDER — AMLODIPINE BESYLATE 10 MG PO TABS
10.0000 mg | ORAL_TABLET | Freq: Every day | ORAL | Status: DC
Start: 2020-07-21 — End: 2020-07-22
  Administered 2020-07-21 – 2020-07-22 (×2): 10 mg via ORAL
  Filled 2020-07-20: qty 2
  Filled 2020-07-20 (×2): qty 1

## 2020-07-20 MED ORDER — DEXAMETHASONE SODIUM PHOSPHATE 10 MG/ML IJ SOLN
INTRAMUSCULAR | Status: DC | PRN
  Administered 2020-07-20: 10 mg via INTRAVENOUS

## 2020-07-20 MED ORDER — MENTHOL 3 MG MT LOZG: 1.0000 | LOZENGE | OROMUCOSAL | Status: DC | PRN

## 2020-07-20 MED ORDER — ONDANSETRON HCL 4 MG/2ML IJ SOLN: 4.0000 mg | Freq: Once | INTRAMUSCULAR | Status: DC | PRN

## 2020-07-20 MED ORDER — INSULIN ASPART 100 UNIT/ML IJ SOLN
0.0000 [IU] | Freq: Three times a day (TID) | INTRAMUSCULAR | Status: DC
  Administered 2020-07-20: 2 [IU] via SUBCUTANEOUS
  Administered 2020-07-20: 5 [IU] via SUBCUTANEOUS
  Administered 2020-07-21: 1 [IU] via SUBCUTANEOUS
  Administered 2020-07-21 (×2): 2 [IU] via SUBCUTANEOUS

## 2020-07-20 MED ORDER — LIDOCAINE 2% (20 MG/ML) 5 ML SYRINGE
INTRAMUSCULAR | Status: DC | PRN
  Administered 2020-07-20: 80 mg via INTRAVENOUS

## 2020-07-20 MED ORDER — METHOCARBAMOL 500 MG PO TABS
500.0000 mg | ORAL_TABLET | Freq: Four times a day (QID) | ORAL | Status: DC | PRN
  Administered 2020-07-20 – 2020-07-21 (×3): 500 mg via ORAL
  Filled 2020-07-20 (×3): qty 1

## 2020-07-20 MED ORDER — METFORMIN HCL ER 500 MG PO TB24
500.0000 mg | ORAL_TABLET | Freq: Two times a day (BID) | ORAL | Status: DC
  Administered 2020-07-20 – 2020-07-21 (×3): 500 mg via ORAL
  Filled 2020-07-20 (×3): qty 1

## 2020-07-20 MED ORDER — ROCURONIUM BROMIDE 10 MG/ML (PF) SYRINGE
PREFILLED_SYRINGE | INTRAVENOUS | Status: DC | PRN
  Administered 2020-07-20: 70 mg via INTRAVENOUS
  Administered 2020-07-20: 30 mg via INTRAVENOUS

## 2020-07-20 MED ORDER — ONDANSETRON HCL 4 MG/2ML IJ SOLN: 4.0000 mg | Freq: Four times a day (QID) | INTRAMUSCULAR | Status: DC | PRN

## 2020-07-20 MED ORDER — PHENOL 1.4 % MT LIQD: 1.0000 | OROMUCOSAL | Status: DC | PRN

## 2020-07-20 MED ORDER — CEFAZOLIN SODIUM-DEXTROSE 2-4 GM/100ML-% IV SOLN
2.0000 g | INTRAVENOUS | Status: AC
  Administered 2020-07-20: 2 g via INTRAVENOUS
  Filled 2020-07-20: qty 100

## 2020-07-20 MED ORDER — SODIUM CHLORIDE 0.9 % IV SOLN: 250.0000 mL | INTRAVENOUS | Status: DC

## 2020-07-20 MED ORDER — PROPOFOL 10 MG/ML IV BOLUS
INTRAVENOUS | Status: DC | PRN
  Administered 2020-07-20: 100 mg via INTRAVENOUS

## 2020-07-20 MED ORDER — BUPIVACAINE LIPOSOME 1.3 % IJ SUSP
INTRAMUSCULAR | Status: DC | PRN
  Administered 2020-07-20: 20 mL

## 2020-07-20 MED ORDER — INSULIN LISPRO 100 UNIT/ML ~~LOC~~ SOLN: 2.0000 [IU] | SUBCUTANEOUS | Status: DC | PRN

## 2020-07-20 MED ORDER — ALBUMIN HUMAN 5 % IV SOLN: INTRAVENOUS | Status: DC | PRN

## 2020-07-20 MED ORDER — ONDANSETRON HCL 4 MG/2ML IJ SOLN
INTRAMUSCULAR | Status: AC
  Filled 2020-07-20: qty 2

## 2020-07-20 MED ORDER — FUROSEMIDE 20 MG PO TABS
20.0000 mg | ORAL_TABLET | ORAL | Status: DC
  Administered 2020-07-20 – 2020-07-22 (×2): 20 mg via ORAL
  Filled 2020-07-20 (×2): qty 1

## 2020-07-20 MED ORDER — SENNOSIDES-DOCUSATE SODIUM 8.6-50 MG PO TABS: 1.0000 | ORAL_TABLET | Freq: Every evening | ORAL | Status: DC | PRN

## 2020-07-20 MED ORDER — ROCURONIUM BROMIDE 10 MG/ML (PF) SYRINGE
PREFILLED_SYRINGE | INTRAVENOUS | Status: AC
  Filled 2020-07-20: qty 10

## 2020-07-20 MED ORDER — CHLORHEXIDINE GLUCONATE 0.12 % MT SOLN: 15.0000 mL | Freq: Once | OROMUCOSAL | Status: AC

## 2020-07-20 MED ORDER — LACTATED RINGERS IV SOLN: INTRAVENOUS | Status: DC

## 2020-07-20 MED ORDER — FENTANYL CITRATE (PF) 250 MCG/5ML IJ SOLN
INTRAMUSCULAR | Status: DC | PRN
  Administered 2020-07-20 (×4): 50 ug via INTRAVENOUS

## 2020-07-20 MED ORDER — BUPIVACAINE LIPOSOME 1.3 % IJ SUSP
INTRAMUSCULAR | Status: AC
  Filled 2020-07-20: qty 20

## 2020-07-20 SURGICAL SUPPLY — 48 items
BONE CANC CHIPS 20CC PCAN1/4 (Bone Implant) ×3 IMPLANT
CHIPS CANC BONE 20CC PCAN1/4 (Bone Implant) ×1 IMPLANT
COVER WAND RF STERILE (DRAPES) IMPLANT
DECANTER SPIKE VIAL GLASS SM (MISCELLANEOUS) IMPLANT
DERMABOND ADVANCED (GAUZE/BANDAGES/DRESSINGS) ×2
DERMABOND ADVANCED .7 DNX12 (GAUZE/BANDAGES/DRESSINGS) ×1 IMPLANT
DEVICE FUSION THRD 40X12 (Joint) ×3 IMPLANT
DEVICE FUSION THRD 50X12 (Joint) ×3 IMPLANT
DRAIN JACKSON RD 7FR 3/32 (WOUND CARE) IMPLANT
DRAPE C-ARM 42X72 X-RAY (DRAPES) ×3 IMPLANT
DRAPE C-ARMOR (DRAPES) ×3 IMPLANT
DRAPE LAPAROTOMY 100X72X124 (DRAPES) ×3 IMPLANT
DRAPE SURG 17X23 STRL (DRAPES) ×3 IMPLANT
DRSG OPSITE POSTOP 4X6 (GAUZE/BANDAGES/DRESSINGS) ×6 IMPLANT
DURAPREP 26ML APPLICATOR (WOUND CARE) ×3 IMPLANT
ELECT BLADE INSULATED 4IN (ELECTROSURGICAL) ×3
ELECT REM PT RETURN 9FT ADLT (ELECTROSURGICAL) ×3
ELECTRODE BLADE INSULATED 4IN (ELECTROSURGICAL) ×1 IMPLANT
ELECTRODE REM PT RTRN 9FT ADLT (ELECTROSURGICAL) ×1 IMPLANT
GAUZE 4X4 16PLY RFD (DISPOSABLE) IMPLANT
GAUZE SPONGE 4X4 12PLY STRL (GAUZE/BANDAGES/DRESSINGS) ×3 IMPLANT
GLOVE ECLIPSE 8.0 STRL XLNG CF (GLOVE) ×3 IMPLANT
GLOVE SRG 8 PF TXTR STRL LF DI (GLOVE) ×1 IMPLANT
GLOVE SURG UNDER POLY LF SZ8 (GLOVE) ×3
GOWN STRL REUS W/ TWL LRG LVL3 (GOWN DISPOSABLE) ×4 IMPLANT
GOWN STRL REUS W/ TWL XL LVL3 (GOWN DISPOSABLE) ×1 IMPLANT
GOWN STRL REUS W/TWL 2XL LVL3 (GOWN DISPOSABLE) IMPLANT
GOWN STRL REUS W/TWL LRG LVL3 (GOWN DISPOSABLE) ×12
GOWN STRL REUS W/TWL XL LVL3 (GOWN DISPOSABLE) ×3
HEMOSTAT POWDER KIT SURGIFOAM (HEMOSTASIS) ×3 IMPLANT
KIT BASIN OR (CUSTOM PROCEDURE TRAY) ×3 IMPLANT
KIT POSITION SURG JACKSON T1 (MISCELLANEOUS) ×3 IMPLANT
KIT TURNOVER KIT B (KITS) ×3 IMPLANT
NEEDLE HYPO 21X1.5 SAFETY (NEEDLE) ×3 IMPLANT
NEEDLE HYPO 25X1 1.5 SAFETY (NEEDLE) ×3 IMPLANT
NS IRRIG 1000ML POUR BTL (IV SOLUTION) ×3 IMPLANT
PACK LAMINECTOMY NEURO (CUSTOM PROCEDURE TRAY) ×3 IMPLANT
PAD ARMBOARD 7.5X6 YLW CONV (MISCELLANEOUS) ×9 IMPLANT
PATTIES SURGICAL .5 X1 (DISPOSABLE) IMPLANT
PUTTY DBF 3CC CORTICAL FIBERS (Putty) ×3 IMPLANT
SPONGE LAP 4X18 RFD (DISPOSABLE) IMPLANT
STAPLER VISISTAT 35W (STAPLE) ×3 IMPLANT
SUT VIC AB 2-0 CP2 18 (SUTURE) ×3 IMPLANT
SYR 20CC LL (SYRINGE) ×3 IMPLANT
SYR CONTROL 10ML LL (SYRINGE) ×6 IMPLANT
TOWEL GREEN STERILE (TOWEL DISPOSABLE) IMPLANT
TOWEL GREEN STERILE FF (TOWEL DISPOSABLE) IMPLANT
WATER STERILE IRR 1000ML POUR (IV SOLUTION) ×3 IMPLANT

## 2020-07-20 NOTE — Anesthesia Postprocedure Evaluation (Signed)
Anesthesia Post Note  Patient: SAATHVIK EVERY  Procedure(s) Performed: Sacroiliac Joint Fusion APPLICATION OF INTRAOPERATIVE CT SCAN     Patient location during evaluation: PACU Anesthesia Type: General Level of consciousness: awake and alert Pain management: pain level controlled Vital Signs Assessment: post-procedure vital signs reviewed and stable Respiratory status: spontaneous breathing, nonlabored ventilation, respiratory function stable and patient connected to nasal cannula oxygen Cardiovascular status: blood pressure returned to baseline and stable Postop Assessment: no apparent nausea or vomiting Anesthetic complications: no   No notable events documented.  Last Vitals:  Vitals:   07/20/20 0950 07/20/20 1031  BP: 121/60 131/70  Pulse: 60 62  Resp: 12 20  Temp: (!) 36.1 C   SpO2: 97% 100%    Last Pain:  Vitals:   07/20/20 0950  TempSrc:   PainSc: 0-No pain                 Catalina Gravel

## 2020-07-20 NOTE — Transfer of Care (Signed)
Immediate Anesthesia Transfer of Care Note  Patient: Alan [name]  Procedure(s) Performed: Sacroiliac Joint Fusion APPLICATION OF INTRAOPERATIVE CT SCAN  Patient Location: PACU  Anesthesia Type:General  Level of Consciousness: drowsy and patient cooperative  Airway & Oxygen Therapy: Patient Spontanous Breathing  Post-op Assessment: Report given to RN and Post -op Vital signs reviewed and stable  Post vital signs: Reviewed and stable  Last Vitals:  Vitals Value Taken Time  BP 114/65 07/20/20 0920  Temp    Pulse 63 07/20/20 0921  Resp 13 07/20/20 0921  SpO2 99 % 07/20/20 0921  Vitals shown include unvalidated device data.  Last Pain:  Vitals:   07/20/20 0612  TempSrc:   PainSc: 0-No pain         Complications: No notable events documented.

## 2020-07-20 NOTE — Op Note (Signed)
Providing Compassionate, Quality Care - Together  Date of service: 07/20/2020  PREOP DIAGNOSIS:  Right sacroiliitis Lumbar spondylosis, status post L2-S1 fusion  POSTOP DIAGNOSIS: Same  PROCEDURE: Right minimally invasive sacroiliac arthrodesis, Medtronic [place] (50 mm, 40 mm 12.5 mm screws) Intraoperative use of neuro navigation, Stealth Intraoperative use of CT scan, O-arm Intraoperative use of allograft (grafton, cancellous bone chips)  SURGEON: Dr. Pieter Partridge C. Demaris Leavell, DO  ASSISTANT: Dr. Duffy Rhody, MD  ANESTHESIA: General Endotracheal  EBL: 50 cc  SPECIMENS: None  DRAINS: None  COMPLICATIONS: None   CONDITION: Hemodynamically stable  HISTORY: Alan [name] is a 80 y.o. male with a history of an L2-S1 fusion.  Over 1 year ago he complained of right SI pain and right lower extremity radiculopathy.  He had difficulty sitting on his right side, sleeping on his right side and difficulty standing or sitting for longer period of time.  He had diagnostic blocks in the right SI joint which improved his pain by greater than 90% over different injections.  He underwent physical therapy which unfortunately worsened his pain.  Given his failure of conservative measures, I recommended a right SI joint arthrodesis for treatment of his pain.  All risks, benefits and expected outcomes were discussed and agreed upon with the patient.  PROCEDURE IN DETAIL: The patient was brought to the operating room. After induction of general anesthesia, the patient was positioned on the operative table in the prone position. All pressure points were meticulously padded.  The posterior sacroiliac region was sterilely prepped and draped in the normal fashion.  Physician driven timeout was performed.  Using a 10 blade, a small incision was made over the left PSIS, and the percutaneous O-arm pin was placed into the ilium.  Good bony purchase was confirmed.  The field was sterilely draped and the O-arm was  brought into the field for intraoperative imaging.  The intraoperative CAT scan was obtained and the PSIS pin was in appropriate placement.  The right SI joint was appropriately visualized.  Using the navigation probe, the registration was verified to be accurate based off bony interface of the PSIS pin.  A lateral to medial trajectory was planned for the SI instrumentation on the patient's right side.  Using navigation, a trajectory was planned to decorticate the joint.  A 10 blade incision was made medially over the right SI joint.  The 10.5 mm tap was then advanced into the right SI joint for decortication.  Funnel was then navigated into the same tract and cancellous bone chips were packed into the SI joint.  A funnel was removed and the wound was hemostased with Surgiflo.  The superior instrumentation trajectory was planned from lateral to medial using navigation.  Using 10 blade small incision was created approximately 1 cm.  Using the 10.5 mm tap, this was malleted and advanced from the ilium into the sacrum using neuronavigation.  A 63mm projection appeared to be appropriate sizing for the hardware therefore a 50 mm x 12.5 mm screw was selected.  This was filled with grafton and placed under neuro navigation along the planned projection with care to avoid violating the S1 foramen.  Good bony purchase was appreciated.  Similar technique was performed for the inferior screw in the lateral and medial trajectory.  A 40 mm x 12.5 mm screws were selected and filled with graft in place under navigation with care to avoid violating the S1 foramen.  Again good bony purchase was appreciated.  The wounds were  hemostased with Surgiflo.  The field was sterilely draped and the O-arm was brought back into the field for post instrumentation image.  Imaging confirmed appropriate placement of the sacroiliac screws as well as bone graft.  The O-arm was removed from the field.  The left PSIS pin was removed with a slap  hammer.  Hemostasis was achieved with bipolar cautery and Surgi-Flo.  Wounds were closed with 2-0 Vicryl sutures.  Skin was closed with Dermabond.  Sterile dressing was applied.  At the end of the case all sponge, needle, and instrument counts were correct. The patient was then transferred to the stretcher, extubated, and taken to the post-anesthesia care unit in stable hemodynamic condition.

## 2020-07-20 NOTE — Anesthesia Procedure Notes (Signed)
Procedure Name: Intubation Date/Time: 07/20/2020 7:45 AM Performed by: Lance Coon, CRNA Pre-anesthesia Checklist: Patient identified, Emergency Drugs available, Suction available, Patient being monitored and Timeout performed Patient Re-evaluated:Patient Re-evaluated prior to induction Oxygen Delivery Method: Circle system utilized Preoxygenation: Pre-oxygenation with 100% oxygen Induction Type: IV induction Ventilation: Mask ventilation without difficulty Laryngoscope Size: Miller and 3 Grade View: Grade I Tube type: Oral Tube size: 7.5 mm Number of attempts: 1 Airway Equipment and Method: Stylet Placement Confirmation: ETT inserted through vocal cords under direct vision, positive ETCO2 and breath sounds checked- equal and bilateral Secured at: 22 cm Tube secured with: Tape Dental Injury: Teeth and Oropharynx as per pre-operative assessment

## 2020-07-20 NOTE — Progress Notes (Signed)
   Providing Compassionate, Quality Care - Together  NEUROSURGERY PROGRESS NOTE   S: Patient seen and examined in recovery room, no complaints at this time.  O: EXAM:  BP 114/65 (BP Location: Right Arm)   Pulse 66   Temp 97.6 F (36.4 C)   Resp 11   Ht 5' 9.5" (1.765 m)   Wt 103 kg   SpO2 98%   BMI 33.06 kg/m   Awake, alert, oriented  PERRLA EOMI Speech fluent, appropriate  CNs grossly intact  5/5 BUE/BLE (right foot and ankle fused) wounds clean dry and intact  ASSESSMENT:  80 y.o. male with   Right sacroiliitis  -Status post right SI fusion on 07/20/2020  PLAN: -PT/OT -Pain control -DVT prophylaxis -Overall doing well, will update family    Thank you for allowing me to participate in this patient's care.  Please do not hesitate to call with questions or concerns.   Elwin Sleight, [facility] Cell: [phone]

## 2020-07-20 NOTE — H&P (Signed)
Providing Compassionate, Quality Care - Together  NEUROSURGERY HISTORY & PHYSICAL   Alan [name] is an 80 y.o. male.   Chief Complaint: Right sacroiliitis HPI: This is a 80 year old male with a history of lumbar fusion that complains of right SI pain and difficulty walking for greater than 1 year.  He had multiple right SI injections that were diagnostic and significantly improved his pain greater than 80%.  He continued to have worsening right SI pain that caused him difficulty in walking, sitting and standing for long periods of time.  He especially has hard time sitting for extended periods of time.  He failed multiple injections, and physical therapy.  Physical therapy worsened his pain.  Therefore we discussed sacroiliac joint arthrodesis.  He presents today from the surgery, has no changes in his symptomatology.  Past Medical History:  Diagnosis Date   Anticoagulant long-term use    Arthritis    Arthropathy of left shoulder    Atypical mole    Biceps tendonitis, left    Bursitis of left shoulder    Cardiac pacemaker in situ    first insertion 2008;  genertor and new lead change 03-08-2012;   Medtronic   CHB (complete heart block) ([place])    s/p  PPM 2008   Chronic back pain    CKD (chronic kidney disease), stage III [facility])    Coronary artery disease cardiologist--- dr croitoru   hx  CABG x2  1990 and re-do 2004;  multiple PCI to [place] to 2004;    cardiac cath 2012  occluded LAD and RCA with patent grafts   Difficult intubation    " Dr. Sallyanne Kuster said that it was difficulty to get the tube in."   History of coronary angioplasty    multiple PCI to RCA ,  1990 to 2004   History of GI bleed followed by GI-- Olevia Perches PA @ [facility] [place]   01/ 2020  upper and lower GI bleed ;  s/p  EGD with cautery of jejunal bleed and blood transfusion's   History of kidney stones    Hx of echocardiogram 06/29/2008   EF 45-50%    Hypertension    Ischemic cardiomyopathy     previously reported , ef 40-45%;  2010--- ef 45-50%;   last echo 2017 55-60%   Mixed hyperlipidemia    Nocturia    OSA on CPAP    cpap, 12, last sleep study Jan2013, sees Dr. Keturah Barre   PAF (paroxysmal atrial fibrillation) ([place])    long hx PAF--- followed by dr croitoru   Post-polio syndrome    dx polio age 31;   09-30-2018  per pt occasional gait issues and occasion fall   Presence of permanent cardiac pacemaker    Pseudoarthrosis of lumbar spine    Rotator cuff tear, left    S/P CABG x 2    1990 x2  and 2004  x2   Sinus node dysfunction ([place])    2015--- sinus node arrest  with intact AV conduction   Squamous cell carcinoma of skin    Type 2 diabetes mellitus ([place])    followed by pcp    Past Surgical History:  Procedure Laterality Date   APPENDECTOMY  child   BLEPHAROPLASTY Bilateral 12/2014   upper eyelid   CARDIAC PACEMAKER PLACEMENT  2008   CARDIOVERSION N/A 01/15/2019   Procedure: CARDIOVERSION;  Surgeon: Sanda Klein, MD;  Location: [facility] ENDOSCOPY;  Service: Cardiovascular;  Laterality: N/A;  CARPAL TUNNEL RELEASE Bilateral 2015;  2016   CATARACT EXTRACTION W/ INTRAOCULAR LENS  IMPLANT, BILATERAL  2017   CORONARY ANGIOPLASTY  1990  to 2004   multiple to [place]    @ [place] (dr chitwood)   seq. LIMA to LAD and Diagonal   CORONARY ARTERY BYPASS GRAFT  2004     dr Amador Cunas   SVG to  RCA and PDA;  ALSO MAZE  PROCEDURE   ESOPHAGOGASTRODUODENOSCOPY Left 03/18/2013   Procedure: ESOPHAGOGASTRODUODENOSCOPY (EGD);  Surgeon: Cleotis Nipper, MD;  Location: [facility] ENDOSCOPY;  Service: Endoscopy;  Laterality: Left;   HARDWARE REMOVAL N/A 05/18/2016   Procedure: Removal of broken hardware Lumbar three-four;  Surgeon: [name], MD;  Location: [place];  Service: Neurosurgery;  Laterality: N/A;   HERNIA REPAIR     LEFT HEART CATH AND CORS/GRAFTS ANGIOGRAPHY N/A 12/18/2018   Procedure: LEFT HEART CATH AND CORS/GRAFTS ANGIOGRAPHY;  Surgeon:  Wellington Hampshire, MD;  Location: [place] CV LAB;  Service: Cardiovascular;  Laterality: N/A;   LEFT HEART CATHETERIZATION WITH CORONARY/GRAFT ANGIOGRAM N/A 12/21/2010   Procedure: LEFT HEART CATHETERIZATION WITH Beatrix Fetters;  Surgeon: Leonie Man, MD;  Location: [facility] CATH LAB;  Service: Cardiovascular;  Laterality: N/A;   LEG SURGERY Right 2002  approx.   lengthened his leg   LUMBAR LAMINECTOMY/DECOMPRESSION MICRODISCECTOMY  01/10/2012   Procedure: LUMBAR LAMINECTOMY/DECOMPRESSION MICRODISCECTOMY 1 LEVEL;  Surgeon: [name], MD;  Location: [place] NEURO ORS;  Service: Neurosurgery;  Laterality: Bilateral;  Lumbar three-four decompression, Posterior lateral fusion lumbar three-four, posterior spinus plate lumbar three-four   LUMBAR LAMINECTOMY/DECOMPRESSION MICRODISCECTOMY Left 05/18/2016   Procedure: Laminectomy and Foraminotomy - Lumbar two-lumbar three- Lumbar four-lumbar five- left with removal hardware lumbar three-four;  Surgeon: [name], MD;  Location: [place];  Service: Neurosurgery;  Laterality: Left;   NASAL SEPTUM SURGERY  yrs ago   PACEMAKER REVISION N/A 03/07/2012   Procedure: PACEMAKER REVISION;  Surgeon: Sanda Klein, MD;  Location: [place] CATH LAB;  Service: Cardiovascular;  Laterality: N/A;   POSTERIOR LUMBAR FUSION  02-22-2017   dr Ronnald Ramp  @ [facility]   re-do laminectomy L2-3 and fusion   POSTERIOR LUMBAR FUSION  03-11-2018   dr Ronnald Ramp @ [facility]   laminectomy L4-5;  fixation L2-S1 and fusion L3-S1   SHOULDER ARTHROSCOPY Right after 2016, pt unsure year   SHOULDER ARTHROSCOPY WITH OPEN ROTATOR CUFF REPAIR Right 03/30/2014   Procedure: RIGHT SHOULDER ARTHROSCOPY  OPEN ROTATOR CUFF REPAIR;  Surgeon: Kerin Salen, MD;  Location: [place];  Service: Orthopedics;  Laterality: Right;   SHOULDER ARTHROSCOPY WITH SUBACROMIAL DECOMPRESSION, ROTATOR CUFF REPAIR AND BICEP TENDON REPAIR Left 10/01/2018   Procedure: LEFT SHOULDER ARTHROSCOPY, DISTAL CLAVICLE EXCISION,SUBACROMIAL,  DECOMPRESSION, RORATOR CUFF REPAIR, BICEPS TENODESIS;  Surgeon: Renette Butters, MD;  Location: [place];  Service: Orthopedics;  Laterality: Left;   SHOULDER OPEN ROTATOR CUFF REPAIR Right 03/30/2014   Procedure: ROTATOR CUFF REPAIR SHOULDER OPEN;  Surgeon: Kerin Salen, MD;  Location: [place];  Service: Orthopedics;  Laterality: Right;   TEE WITHOUT CARDIOVERSION N/A 01/15/2019   Procedure: TRANSESOPHAGEAL ECHOCARDIOGRAM (TEE);  Surgeon: Sanda Klein, MD;  Location: [facility] ENDOSCOPY;  Service: Cardiovascular;  Laterality: N/A;   TONSILLECTOMY  child   TOTAL KNEE ARTHROPLASTY Left 2006    Family History  Problem Relation Age of Onset   Other Father        MVA   Cancer Mother  Social History:  reports that he has never smoked. He has never used smokeless tobacco. He reports previous alcohol use. He reports that he does not use drugs.  Allergies: No Known Allergies  Medications Prior to Admission  Medication Sig Dispense Refill   amiodarone (PACERONE) 200 MG tablet Take 200 mg by mouth daily.     amLODipine (NORVASC) 10 MG tablet Take 10 mg by mouth daily.      carvedilol (COREG) 25 MG tablet TAKE 2 TABLETS BY MOUTH TWICE DAILY WITH  A  MEAL. APPOINTMNT REQUIRED FOR FUTURE REFILLS. (Patient taking differently: Take 50 mg by mouth 2 (two) times daily with a meal.) 120 tablet 3   furosemide (LASIX) 40 MG tablet Take 20 mg by mouth every other day.     glipiZIDE (GLUCOTROL) 10 MG tablet Take 10 mg by mouth 2 (two) times daily.     insulin lispro (HUMALOG) 100 UNIT/ML injection Inject 2-8 Units into the skin as needed for high blood sugar.      losartan (COZAAR) 100 MG tablet Take 100 mg by mouth daily.      metFORMIN (GLUCOPHAGE) 500 MG tablet Take 500 mg by mouth 2 (two) times daily with a meal. Takes with 500mg  XR     metFORMIN (GLUCOPHAGE-XR) 500 MG 24 hr tablet Take 500 mg by mouth 2 (two) times daily.     Multiple Vitamin (MULTIVITAMIN WITH MINERALS) TABS tablet Take  1 tablet by mouth daily.      pantoprazole (PROTONIX) 40 MG tablet Take 40 mg by mouth 2 (two) times daily.     aspirin EC 81 MG tablet Take 1 tablet (81 mg total) by mouth daily. Swallow whole. (Patient not taking: No sig reported) 90 tablet 3   fluticasone (FLONASE) 50 MCG/ACT nasal spray Place 2 sprays into both nostrils daily as needed for allergies.      Potassium Chloride ER 20 MEQ TBCR Take 1 tablet by mouth once daily (Patient taking differently: Take 20 mEq by mouth daily.) 90 tablet 3    Results for orders placed or performed during the hospital encounter of 07/20/20 (from the past 48 hour(s))  Glucose, capillary     Status: Abnormal   Collection Time: 07/20/20  6:03 AM  Result Value Ref Range   Glucose-Capillary 139 (H) 70 - 99 mg/dL    Comment: Glucose reference range applies only to samples taken after fasting for at least 8 hours.   No results found.  ROS All pertinent positives and negatives are listed HPI above   Blood pressure 134/62, pulse 80, temperature 98 F (36.7 C), temperature source Oral, resp. rate 20, height 5' 9.5" (1.765 m), weight 103 kg, SpO2 100 %. Physical Exam  AOx3 No acute distress PERRLA Cranial nerves II through XII intact EOMI Moves all extremities equally, right lower extremity ankle and foot fused Sensory intact to light touch Follows commands x4   Assessment/Plan 80 year old male with  Right sacroiliitis   -OR today for right SI joint fusion.  All risks, benefits and expected outcomes were discussed with the patient and agreed upon.  -Of note his hemoglobin is slightly lower than normal at 8.8.  He was recently hospitalized a few weeks ago with a lower GI bleed/melena in which his hemoglobin remained stable.  He does not take any anticoagulants or antiplatelets.  He denies any further melena, denies fatigue and wishes to proceed with surgical intervention.   Thank you for allowing me to participate in this patient's care.  Please  do not hesitate to call with questions or concerns.   Elwin Sleight, [facility] Cell: [phone]

## 2020-07-21 ENCOUNTER — Encounter (HOSPITAL_COMMUNITY): Payer: Self-pay | Admitting: Neurological Surgery

## 2020-07-21 DIAGNOSIS — M461 Sacroiliitis, not elsewhere classified: Secondary | ICD-10-CM | POA: Diagnosis not present

## 2020-07-21 LAB — GLUCOSE, CAPILLARY
Glucose-Capillary: 178 mg/dL — ABNORMAL HIGH (ref 70–99)
Glucose-Capillary: 126 mg/dL — ABNORMAL HIGH (ref 70–99)
Glucose-Capillary: 172 mg/dL — ABNORMAL HIGH (ref 70–99)
Glucose-Capillary: 96 mg/dL (ref 70–99)

## 2020-07-21 LAB — CBC
HCT: 24.8 % — ABNORMAL LOW (ref 39.0–52.0)
Hemoglobin: 7.9 g/dL — ABNORMAL LOW (ref 13.0–17.0)
MCH: 29.5 pg (ref 26.0–34.0)
MCHC: 31.9 g/dL (ref 30.0–36.0)
MCV: 92.5 fL (ref 80.0–100.0)
Platelets: 120 10*3/uL — ABNORMAL LOW (ref 150–400)
RBC: 2.68 MIL/uL — ABNORMAL LOW (ref 4.22–5.81)
RDW: 13.9 % (ref 11.5–15.5)
WBC: 9.4 10*3/uL (ref 4.0–10.5)
nRBC: 0 % (ref 0.0–0.2)

## 2020-07-21 MED ORDER — SENNOSIDES-DOCUSATE SODIUM 8.6-50 MG PO TABS
1.0000 | ORAL_TABLET | Freq: Two times a day (BID) | ORAL | Status: DC
  Administered 2020-07-21 – 2020-07-22 (×3): 1 via ORAL
  Filled 2020-07-21 (×3): qty 1

## 2020-07-21 MED ORDER — MAGNESIUM HYDROXIDE 400 MG/5ML PO SUSP: 15.0000 mL | Freq: Every day | ORAL | Status: DC | PRN

## 2020-07-21 NOTE — Evaluation (Signed)
Occupational Therapy Evaluation Patient Details Name: Alan [name] MRN: [id number] DOB: 1940/08/07 Today's Date: 07/21/2020    History of Present Illness 80 year old male with a history of lumbar fusion.  Underwent right SI joint fusion.  PMH includes, CAD, CKD, and recent GI bleed, R ankle fusion.   Clinical Impression   Patient admitted for the diagnosis and procedure above.  PTA he was fairly independent with occasional use of SPC.  He has a SO that can assist PRN.  Barriers are listed below, currently he is needing up to Mod A for lower body ADL and up to [facility] for mobility.  OT to continue to follow in the acute setting, and assist with post acute recommendations.     Follow Up Recommendations  No OT follow up    Equipment Recommendations  None recommended by OT    Recommendations for Other Services       Precautions / Restrictions Precautions Precautions: Back Precaution Booklet Issued: Yes (comment) Restrictions Weight Bearing Restrictions: No      Mobility Bed Mobility Overal bed mobility: Needs Assistance Bed Mobility: Rolling;Sidelying to Sit;Sit to Sidelying   Sidelying to sit: Min guard     Sit to sidelying: Min assist   Patient Response: Cooperative  Transfers Overall transfer level: Needs assistance Equipment used: Standard walker Transfers: Sit to/from [facility] Sit to Stand: Min guard Stand pivot transfers: Min guard            Balance Overall balance assessment: Needs assistance Sitting-balance support: No upper extremity supported;Feet supported Sitting balance-Leahy Scale: Good     Standing balance support: Bilateral upper extremity supported Standing balance-Leahy Scale: Poor Standing balance comment: needs RW                           ADL either performed or assessed with clinical judgement   ADL Overall ADL's : Needs assistance/impaired                     Lower Body Dressing: Moderate  assistance;Sitting/lateral leans   Toilet Transfer: Min guard;RW           Functional mobility during ADLs: Min guard;Rolling walker       Vision Patient Visual Report: No change from baseline       Perception     Praxis      Pertinent Vitals/Pain Pain Assessment: Faces Faces Pain Scale: Hurts a little bit Pain Descriptors / Indicators: Grimacing Pain Intervention(s): Monitored during session     Hand Dominance Right   Extremity/Trunk Assessment Upper Extremity Assessment Upper Extremity Assessment: Overall WFL for tasks assessed   Lower Extremity Assessment Lower Extremity Assessment: Defer to PT evaluation   Cervical / Trunk Assessment Cervical / Trunk Assessment: Kyphotic   Communication Communication Communication: No difficulties   Cognition Arousal/Alertness: Awake/alert Behavior During Therapy: WFL for tasks assessed/performed Overall Cognitive Status: Within Functional Limits for tasks assessed                                     General Comments       Exercises     Shoulder Instructions      Home Living Family/patient expects to be discharged to:: Private residence Living Arrangements: Alone Available Help at Discharge: Family;Available PRN/intermittently;Friend(s) Type of Home: House Home Access: Stairs to enter [employer] of Steps: 3 Entrance Stairs-Rails: Can  reach both Home Layout: One level     Bathroom Shower/Tub: Occupational psychologist: Standard Bathroom Accessibility: Yes How Accessible: Accessible via walker;Accessible via wheelchair Home Equipment: Gilford Rile - 4 wheels;Walker - standard;Adaptive equipment;Grab bars - tub/shower;Shower seat;Bedside commode;Cane - single point Adaptive Equipment: Reacher        Prior Functioning/Environment Level of Independence: Independent with assistive device(s)        Comments: cane as needed        OT Problem List: Decreased activity  tolerance;Impaired balance (sitting and/or standing);Pain      OT Treatment/Interventions: Self-care/ADL training;DME and/or AE instruction;Balance training;Therapeutic activities    OT Goals(Current goals can be found in the care plan section) Acute Rehab OT Goals Patient Stated Goal: return home and mow my grass OT Goal Formulation: With patient Time For Goal Achievement: 08/04/20 Potential to Achieve Goals: Good ADL Goals Pt Will Perform Lower Body Bathing: with modified independence;sit to/from stand Pt Will Perform Lower Body Dressing: with modified independence;sit to/from stand Pt Will Transfer to Toilet: with modified independence;ambulating;regular height toilet  OT Frequency: Min 2X/week   Barriers to D/C:    none noted       Co-evaluation              AM-PAC OT "6 Clicks" Daily Activity     Outcome Measure Help from another person eating meals?: None Help from another person taking care of personal grooming?: None Help from another person toileting, which includes using toliet, bedpan, or urinal?: A Little Help from another person bathing (including washing, rinsing, drying)?: A Little Help from another person to put on and taking off regular upper body clothing?: A Little Help from another person to put on and taking off regular lower body clothing?: A Lot 6 Click Score: 19   End of Session Equipment Utilized During Treatment: Rolling walker Nurse Communication: Mobility status  Activity Tolerance: Patient tolerated treatment well Patient left: in bed;with call bell/phone within reach;with family/visitor present  OT Visit Diagnosis: Unsteadiness on feet (R26.81);Pain                Time: 9381-8299 OT Time Calculation (min): 37 min Charges:  OT General Charges $OT Visit: 1 Visit OT Evaluation $OT Eval Moderate Complexity: 1 Mod OT Treatments $Self Care/Home Management : 8-22 mins  07/21/2020  Rich, OTR/L  Acute Rehabilitation Services  Office:   [phone]   Metta Clines 07/21/2020, 9:00 AM

## 2020-07-21 NOTE — Care Management Obs Status (Signed)
MEDICARE OBSERVATION STATUS NOTIFICATION   Patient Details  Name: Alan [name] MRN: [id number] Date of Birth: April 12, 1940   Medicare Observation Status Notification Given:  Yes    Joanne Chars, LCSW 07/21/2020, 9:31 AM

## 2020-07-21 NOTE — Progress Notes (Signed)
   Providing Compassionate, Quality Care - Together  NEUROSURGERY PROGRESS NOTE   S: No issues overnight. Minimal pain, denies SOB, fatigue, lightheadedness  O: EXAM:  BP 135/66 (BP Location: Right Arm)   Pulse 61   Temp 97.8 F (36.6 C) (Oral)   Resp 18   Ht 5' 9.5" (1.765 m)   Wt 103 kg   SpO2 100%   BMI 33.06 kg/m   Awake, alert, oriented  PERRL Incisions c/d/i Speech fluent, appropriate  CNs grossly intact  5/5 BUE/BLE, except fused R ankle and foot  ASSESSMENT:  80 y.o. male with   Right Sacroiliitis    -postop R SI fusion  PLAN: - pt/ot -pain control Bowel regimen    Thank you for allowing me to participate in this patient's care.  Please do not hesitate to call with questions or concerns.   Elwin Sleight, [facility] Cell: [phone]

## 2020-07-21 NOTE — Evaluation (Signed)
Physical Therapy Evaluation Patient Details Name: Alan [name] MRN: [id number] DOB: [birth year] Today's Date: 07/21/2020   History of Present Illness  80 year old male with a history of lumbar fusion.  Underwent right SI joint fusion.  PMH includes, CAD, CKD, and recent GI bleed.   Clinical Impression  Pt admitted with above diagnosis. At the time of PT eval, pt was able to demonstrate transfers and ambulation with gross min guard assist and RW for support. Pt was educated on precautions, brace application/wearing schedule, appropriate activity progression, and car transfer. Pt currently with functional limitations due to the deficits listed below (see PT Problem List). Pt will benefit from skilled PT to increase their independence and safety with mobility to allow discharge to the venue listed below.      Follow Up Recommendations No PT follow up;Supervision for mobility/OOB    Equipment Recommendations  None recommended by PT    Recommendations for Other Services       Precautions / Restrictions Precautions Precautions: Back Precaution Booklet Issued: Yes (comment) Restrictions Weight Bearing Restrictions: No      Mobility  Bed Mobility Overal bed mobility: Needs Assistance Bed Mobility: Rolling;Sidelying to Sit Rolling: Supervision Sidelying to sit: Supervision     Sit to sidelying: Min assist General bed mobility comments: Close supervision for safety as pt transitioned to EOB. HOB flat with rails lowered to simulate home environment.    Transfers Overall transfer level: Needs assistance Equipment used: Rolling walker (2 wheeled) Transfers: Sit to/from Stand Sit to Stand: Min guard Stand pivot transfers: Min guard       General transfer comment: Close guard for safety as pt powered up to full stand. VC's for hand placement on seated surface for safety.  Ambulation/Gait Ambulation/Gait assistance: Min guard Gait Distance (Feet): 150 Feet Assistive device:  Rolling walker (2 wheeled) Gait Pattern/deviations: Step-through pattern;Decreased step length - right;Decreased dorsiflexion - right;Decreased weight shift to right;Trunk flexed;Antalgic Gait velocity: Decreased Gait velocity interpretation: <1.31 ft/sec, indicative of household ambulator General Gait Details: Grossly antalgic due to baseline RLE deficits from polio. Pt motivated for distance and was able to ambulate a greater distance in hall than he's done prior to PT session.  Stairs            Wheelchair Mobility    Modified Rankin (Stroke Patients Only)       Balance Overall balance assessment: Needs assistance Sitting-balance support: No upper extremity supported;Feet supported Sitting balance-Leahy Scale: Fair     Standing balance support: Bilateral upper extremity supported Standing balance-Leahy Scale: Poor Standing balance comment: needs RW                             Pertinent Vitals/Pain Pain Assessment: Faces Faces Pain Scale: Hurts a little bit Pain Descriptors / Indicators: Grimacing Pain Intervention(s): Limited activity within patient's tolerance;Monitored during session;Repositioned    Home Living Family/patient expects to be discharged to:: Private residence Living Arrangements: Alone Available Help at Discharge: Family;Available PRN/intermittently;Friend(s) Type of Home: House Home Access: Stairs to enter Entrance Stairs-Rails: Can reach both Entrance Stairs-Number of Steps: 3 Home Layout: One level Home Equipment: Walker - 4 wheels;Walker - standard;Adaptive equipment;Grab bars - tub/shower;Shower seat;Bedside commode;Cane - single point      Prior Function Level of Independence: Independent with assistive device(s)         Comments: cane as needed     Hand Dominance   Dominant Hand: Right    Extremity/Trunk Assessment  Upper Extremity Assessment Upper Extremity Assessment: Defer to OT evaluation    Lower Extremity  Assessment Lower Extremity Assessment: Generalized weakness;RLE deficits/detail RLE Deficits / Details: Baseline deficits 2 polio    Cervical / Trunk Assessment Cervical / Trunk Assessment: Other exceptions Cervical / Trunk Exceptions: Forward head posture with rounded shoulders  Communication   Communication: No difficulties  Cognition Arousal/Alertness: Awake/alert Behavior During Therapy: WFL for tasks assessed/performed Overall Cognitive Status: Within Functional Limits for tasks assessed                                        General Comments      Exercises     Assessment/Plan    PT Assessment Patient needs continued PT services  PT Problem List Decreased strength;Decreased activity tolerance;Decreased balance;Decreased mobility;Decreased knowledge of use of DME;Decreased safety awareness;Decreased knowledge of precautions;Pain       PT Treatment Interventions DME instruction;Gait training;Stair training;Functional mobility training;Therapeutic activities;Therapeutic exercise;Neuromuscular re-education;Patient/family education    PT Goals (Current goals can be found in the Care Plan section)  Acute Rehab PT Goals Patient Stated Goal: return home and mow my grass PT Goal Formulation: With patient Time For Goal Achievement: 07/28/20 Potential to Achieve Goals: Good    Frequency Min 5X/week   Barriers to discharge        Co-evaluation               AM-PAC PT "6 Clicks" Mobility  Outcome Measure Help needed turning from your back to your side while in a flat bed without using bedrails?: None Help needed moving from lying on your back to sitting on the side of a flat bed without using bedrails?: A Little Help needed moving to and from a bed to a chair (including a wheelchair)?: A Little Help needed standing up from a chair using your arms (e.g., wheelchair or bedside chair)?: A Little Help needed to walk in hospital room?: A Little Help  needed climbing 3-5 steps with a railing? : A Little 6 Click Score: 19    End of Session Equipment Utilized During Treatment: Gait belt;Back brace Activity Tolerance: Patient tolerated treatment well Patient left: in bed;with call bell/phone within reach Nurse Communication: Mobility status PT Visit Diagnosis: Unsteadiness on feet (R26.81);Pain Pain - part of body:  (back)    Time: 5701-7793 PT Time Calculation (min) (ACUTE ONLY): 22 min   Charges:   PT Evaluation $PT Eval Low Complexity: 1 Low          Rolinda Roan, PT, DPT Acute Rehabilitation Services Pager: [phone] Office: [phone]   Thelma Comp 07/21/2020, 2:36 PM

## 2020-07-21 NOTE — Care Management CC44 (Signed)
Condition Code 44 Documentation Completed  Patient Details  Name: Alan [name] MRN: [id number] Date of Birth: 22-Aug-1940   Condition Code 44 given:  Yes Patient signature on Condition Code 44 notice:  Yes Documentation of 2 MD's agreement:  Yes Code 44 added to claim:  Yes    Joanne Chars, LCSW 07/21/2020, 9:32 AM

## 2020-07-22 DIAGNOSIS — M461 Sacroiliitis, not elsewhere classified: Secondary | ICD-10-CM | POA: Diagnosis not present

## 2020-07-22 LAB — GLUCOSE, CAPILLARY
Glucose-Capillary: 52 mg/dL — ABNORMAL LOW (ref 70–99)
Glucose-Capillary: 58 mg/dL — ABNORMAL LOW (ref 70–99)
Glucose-Capillary: 95 mg/dL (ref 70–99)
Glucose-Capillary: 83 mg/dL (ref 70–99)

## 2020-07-22 MED ORDER — HYDROCODONE-ACETAMINOPHEN 10-325 MG PO TABS: 1.0000 | ORAL_TABLET | ORAL | 0 refills | Status: DC | PRN

## 2020-07-22 MED ORDER — METHOCARBAMOL 500 MG PO TABS: 500.0000 mg | ORAL_TABLET | Freq: Four times a day (QID) | ORAL | 1 refills | Status: DC | PRN

## 2020-07-22 NOTE — Progress Notes (Signed)
Occupational Therapy Treatment Patient Details Name: Alan [name] MRN: [id number] DOB: 1940-08-05 Today's Date: 07/22/2020    History of present illness 80 year old male with a history of lumbar fusion.  Underwent right SI joint fusion.  PMH includes, CAD, CKD, and recent GI bleed.   OT comments  Patient with good recall of all precautions, able to complete sit/stand ADL with the hip kit, and up and moving in room/toileting at RW level and no assist.  Patient primary complaint is fatigue and mild surgical; site discomfort.  He will have adequate assist at home, all questions answered, and he is ready to return home.  No further acute OT needs identified.    Follow Up Recommendations  No OT follow up    Equipment Recommendations  None recommended by OT    Recommendations for Other Services      Precautions / Restrictions Precautions Precautions: Back Precaution Booklet Issued: Yes (comment) Restrictions Weight Bearing Restrictions: No       Mobility Bed Mobility               General bed mobility comments: Pt was received sitting up EOB. Patient Response: Cooperative  Transfers Overall transfer level: Needs assistance Equipment used: Rolling walker (2 wheeled) Transfers: Sit to/from Stand Sit to Stand: Supervision         General transfer comment: Light guard for safety as pt powered up to full stand. VC's for hand placement on seated surface for safety.    Balance Overall balance assessment: Needs assistance Sitting-balance support: Feet supported Sitting balance-Leahy Scale: Good     Standing balance support: Bilateral upper extremity supported Standing balance-Leahy Scale: Fair Standing balance comment: needs RW                           ADL either performed or assessed with clinical judgement   ADL       Grooming: Wash/dry hands;Wash/dry face;Set up;Standing           Upper Body Dressing : Standing;Set up   Lower Body Dressing:  Sit to/from stand;Supervision/safety   Toilet Transfer: Supervision/safety;Ambulation;Comfort height toilet   Toileting- Clothing Manipulation and Hygiene: Independent;Sitting/lateral lean       Functional mobility during ADLs: Supervision/safety       Vision       Perception     Praxis      Cognition Arousal/Alertness: Awake/alert Behavior During Therapy: WFL for tasks assessed/performed Overall Cognitive Status: Within Functional Limits for tasks assessed                                                            Pertinent Vitals/ Pain       Pain Assessment: Faces Faces Pain Scale: Hurts a little bit Pain Descriptors / Indicators: Grimacing Pain Intervention(s): Monitored during session                                                          Frequency     D/c      Progress Toward Goals  OT Goals(current goals can now be found in  the care plan section)  Progress towards OT goals: Progressing toward goals  Acute Rehab OT Goals Patient Stated Goal: return home and mow my grass OT Goal Formulation: With patient Time For Goal Achievement: 08/04/20 Potential to Achieve Goals: Good  Plan All goals met and education completed, patient discharged from OT services    Co-evaluation                 AM-PAC OT "6 Clicks" Daily Activity     Outcome Measure   Help from another person eating meals?: None Help from another person taking care of personal grooming?: None Help from another person toileting, which includes using toliet, bedpan, or urinal?: None Help from another person bathing (including washing, rinsing, drying)?: None Help from another person to put on and taking off regular upper body clothing?: None Help from another person to put on and taking off regular lower body clothing?: None 6 Click Score: 24    End of Session Equipment Utilized During Treatment: Rolling walker  OT Visit  Diagnosis: Unsteadiness on feet (R26.81)   Activity Tolerance Patient tolerated treatment well   Patient Left in bed;with call bell/phone within reach;with family/visitor present   Nurse Communication          Time: 2330-0762 OT Time Calculation (min): 27 min  Charges: OT General Charges $OT Visit: 1 Visit OT Treatments $Self Care/Home Management : 23-37 mins  07/22/2020  Rich, OTR/L  Acute Rehabilitation Services  Office:  [phone]    Metta Clines 07/22/2020, 9:54 AM

## 2020-07-22 NOTE — Plan of Care (Signed)
Adequate Ready for Discharge

## 2020-07-22 NOTE — Discharge Instructions (Signed)
Wound Care Keep incision covered and dry for two days.   Do not put any creams, lotions, or ointments on incision. Leave steri-strips on back.  They will fall off by themselves. Activity Walk each and every day, increasing distance each day. No lifting greater than 5 lbs.  Avoid excessive neck motion. No driving for 2 weeks; may ride as a passenger locally.  Diet Resume your normal diet.   Call Your Doctor If Any of These Occur Redness, drainage, or swelling at the wound.  Temperature greater than 101 degrees. Severe pain not relieved by pain medication. Incision starts to come apart. Follow Up Appt Call  [phone]) for problems.  If you have any hardware placed in your spine, you will need an x-ray before your appointment.

## 2020-07-22 NOTE — Discharge Summary (Signed)
Physician Discharge Summary  Patient ID: Alan [name] MRN: [id number] DOB/AGE: 1940-05-29 80 y.o.  Admit date: 07/20/2020 Discharge date: 07/22/2020  Admission Diagnoses:  Right sacroiliitis  Discharge Diagnoses:  Same Active Problems:   Sacroiliitis [facility])   Discharged Condition: Stable  Hospital Course:  Alan [name] is a 80 y.o. 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  He underwent a right SI fusion on 07/20/2020.  He tolerated the surgery well without complication.  Postoperatively he was evaluated by PT/OT and recommended to go home with family assistance.  His pain was controlled on oral medication, and he was having normal bowel bladder function upon discharge.  He was ambulating well and his preoperative symptoms were completely resolved.  His incisions were clean dry and intact.  Treatments: Surgery -right sacroiliac fusion  Discharge Exam: Blood pressure (!) 124/59, pulse 66, temperature 98.4 F (36.9 C), temperature source Oral, resp. rate 18, height 5' 9.5" (1.765 m), weight 103 kg, SpO2 100 %. Awake, alert, oriented PERRLA EOMI Speech fluent, appropriate CN grossly intact 5/5 BUE/BLE, except right ankle and foot autofused Wound c/d/i  Disposition: Discharge disposition: 01-Home or Self Care       Discharge Instructions     Incentive spirometry RT   Complete by: As directed       Allergies as of 07/22/2020   No Known Allergies      Medication List     STOP taking these medications    aspirin EC 81 MG tablet       TAKE these medications    amiodarone 200 MG tablet Commonly known as: PACERONE Take 200 mg by mouth daily.   amLODipine 10 MG tablet Commonly known as: NORVASC Take 10 mg by mouth daily.   carvedilol 25 MG tablet Commonly known as: COREG TAKE 2 TABLETS BY MOUTH TWICE DAILY WITH  A  MEAL. APPOINTMNT REQUIRED FOR FUTURE REFILLS. What changed: See the new instructions.    fluticasone 50 MCG/ACT nasal spray Commonly known as: FLONASE Place 2 sprays into both nostrils daily as needed for allergies.   furosemide 40 MG tablet Commonly known as: LASIX Take 20 mg by mouth every other day.   glipiZIDE 10 MG tablet Commonly known as: GLUCOTROL Take 10 mg by mouth 2 (two) times daily.   HYDROcodone-acetaminophen 10-325 MG tablet Commonly known as: NORCO Take 1 tablet by mouth every 4 (four) hours as needed for moderate pain ((score 4 to 6)).   insulin lispro 100 UNIT/ML injection Commonly known as: HUMALOG Inject 2-8 Units into the skin as needed for high blood sugar.   losartan 100 MG tablet Commonly known as: COZAAR Take 100 mg by mouth daily.   metFORMIN 500 MG tablet Commonly known as: GLUCOPHAGE Take 500 mg by mouth 2 (two) times daily with a meal. Takes with 500mg  XR   metFORMIN 500 MG 24 hr tablet Commonly known as: GLUCOPHAGE-XR Take 500 mg by mouth 2 (two) times daily.   methocarbamol 500 MG tablet Commonly known as: ROBAXIN Take 1 tablet (500 mg total) by mouth every 6 (six) hours as needed for muscle spasms.   multivitamin with minerals Tabs tablet Take 1 tablet by mouth daily.   pantoprazole 40 MG tablet Commonly known as: PROTONIX Take 40 mg by mouth 2 (two) times daily.   Potassium Chloride ER 20 MEQ Tbcr Take 1 tablet by mouth once daily What changed: how much to take        Follow-up  Information     Amaiyah Nordhoff C, DO Follow up in 1 month(s).   Contact information: [address] [place] [address] [phone]                 Signed: Theodoro Doing Shay Jhaveri 07/22/2020, 12:29 PM

## 2020-07-22 NOTE — Progress Notes (Signed)
Physical Therapy Treatment Patient Details Name: Alan [name] MRN: [id number] DOB: 01-Dec-1940 Today's Date: 07/22/2020    History of Present Illness 80 year old male with a history of lumbar fusion.  Underwent right SI joint fusion.  PMH includes, CAD, CKD, and recent GI bleed.    PT Comments    Pt progressing well with post-op mobility. He was able to demonstrate transfers and ambulation with gross min guard assist to supervision for safety. Pt was educated on precautions, positioning recommendations, appropriate activity progression, and car transfer. Will continue to follow.      Follow Up Recommendations  No PT follow up;Supervision for mobility/OOB     Equipment Recommendations  None recommended by PT    Recommendations for Other Services       Precautions / Restrictions Precautions Precautions: Back Precaution Booklet Issued: Yes (comment) Restrictions Weight Bearing Restrictions: No    Mobility  Bed Mobility               General bed mobility comments: Pt was received sitting up EOB.    Transfers Overall transfer level: Needs assistance Equipment used: Rolling walker (2 wheeled) Transfers: Sit to/from Stand Sit to Stand: Supervision         General transfer comment: Light guard for safety as pt powered up to full stand. VC's for hand placement on seated surface for safety.  Ambulation/Gait Ambulation/Gait assistance: Min guard;Supervision Gait Distance (Feet): 200 Feet Assistive device: Rolling walker (2 wheeled) Gait Pattern/deviations: Step-through pattern;Decreased step length - right;Decreased dorsiflexion - right;Decreased weight shift to right;Trunk flexed;Antalgic Gait velocity: Decreased Gait velocity interpretation: <1.8 ft/sec, indicate of risk for recurrent falls General Gait Details: Pt with improved step-through pattern today.   Stairs Stairs: Yes Stairs assistance: Min guard Stair Management: Two rails;Forwards;One rail  Left;Sideways Number of Stairs: 12 (10+1+1) General stair comments: Flight with 2 railings, and then practiced 1 step utilizing L rail only to simulate home environment.   Wheelchair Mobility    Modified Rankin (Stroke Patients Only)       Balance Overall balance assessment: Needs assistance Sitting-balance support: No upper extremity supported;Feet supported Sitting balance-Leahy Scale: Fair     Standing balance support: Bilateral upper extremity supported Standing balance-Leahy Scale: Poor Standing balance comment: needs RW                            Cognition Arousal/Alertness: Awake/alert Behavior During Therapy: WFL for tasks assessed/performed Overall Cognitive Status: Within Functional Limits for tasks assessed                                        Exercises      General Comments        Pertinent Vitals/Pain Pain Assessment: Faces Faces Pain Scale: Hurts a little bit Pain Descriptors / Indicators: Grimacing Pain Intervention(s): Limited activity within patient's tolerance;Monitored during session;Repositioned    Home Living                      Prior Function            PT Goals (current goals can now be found in the care plan section) Acute Rehab PT Goals Patient Stated Goal: return home and mow my grass PT Goal Formulation: With patient Time For Goal Achievement: 07/28/20 Potential to Achieve Goals: Good Progress towards PT goals: Progressing toward goals  Frequency    Min 5X/week      PT Plan Current plan remains appropriate    Co-evaluation              AM-PAC PT "6 Clicks" Mobility   Outcome Measure  Help needed turning from your back to your side while in a flat bed without using bedrails?: None Help needed moving from lying on your back to sitting on the side of a flat bed without using bedrails?: None Help needed moving to and from a bed to a chair (including a wheelchair)?: A  Little Help needed standing up from a chair using your arms (e.g., wheelchair or bedside chair)?: A Little Help needed to walk in hospital room?: A Little Help needed climbing 3-5 steps with a railing? : A Little 6 Click Score: 20    End of Session Equipment Utilized During Treatment: Gait belt Activity Tolerance: Patient tolerated treatment well Patient left: in bed;with call bell/phone within reach Nurse Communication: Mobility status PT Visit Diagnosis: Unsteadiness on feet (R26.81);Pain Pain - part of body:  (back)     Time: 1643-5391 PT Time Calculation (min) (ACUTE ONLY): 20 min  Charges:  $Gait Training: 8-22 mins                     Rolinda Roan, PT, DPT Acute Rehabilitation Services Pager: [phone] Office: [phone]    Thelma Comp 07/22/2020, 8:56 AM

## 2020-07-22 NOTE — Progress Notes (Signed)
Date and time results received: 07/22/20 0643 (use smartphrase ".now" to insert current time)  Test: CBG Critical Value: 51  Name of Provider Notified: none  Orders Received? Or Actions Taken?: Actions Taken: 4 0z OJ given  Will recheck CBG @ 0800.  (Pt refused recheck 0720)

## 2020-07-27 NOTE — Progress Notes (Signed)
Remote pacemaker transmission.   

## 2020-08-03 ENCOUNTER — Ambulatory Visit: Payer: Medicare HMO | Admitting: Physician Assistant

## 2020-08-03 ENCOUNTER — Other Ambulatory Visit: Payer: Self-pay

## 2020-08-03 ENCOUNTER — Encounter: Payer: Self-pay | Admitting: Physician Assistant

## 2020-08-03 DIAGNOSIS — Z86018 Personal history of other benign neoplasm: Secondary | ICD-10-CM | POA: Diagnosis not present

## 2020-08-03 DIAGNOSIS — Z85828 Personal history of other malignant neoplasm of skin: Secondary | ICD-10-CM | POA: Diagnosis not present

## 2020-08-03 DIAGNOSIS — L570 Actinic keratosis: Secondary | ICD-10-CM | POA: Diagnosis not present

## 2020-08-03 NOTE — Progress Notes (Signed)
   Follow-Up Visit   Subjective  Alan [name] is a 80 y.o. male who presents for the following: Follow-up (Patient here today for 4 month follow up. No new concerns. 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 No personal history of melanoma. No family history of atypical moles, melanoma and non mole skin cancer. ).   The following portions of the chart were reviewed this encounter and updated as appropriate:  Tobacco  Allergies  Meds  Problems  Med Hx  Surg Hx  Fam Hx       Objective  Well appearing patient in no apparent distress; mood and affect are within normal limits.  All skin waist up examined.  Chest - Medial [facility]), Left Ear, Left Forearm - Anterior (10), Left Temple, Mid Upper [place] Lip (2), Right Ear, Right Eyebrow, Right Forearm - Anterior (10), Right Temple Erythematous patches with gritty scale.   Assessment & Plan  AK (actinic keratosis) (28) Left Ear; Right Ear; Left Forearm - Anterior (10); Right Forearm - Anterior (10); Chest - Medial (Center); Right Eyebrow; Left Temple; Right Temple; Mid Upper [place] Lip (2)  Destruction of lesion - Chest - Medial (Center), Left Ear, Left Forearm - Anterior, Left Temple, Mid Upper [place] Lip, Right Ear, Right Eyebrow, Right Forearm - Anterior, Right Temple Complexity: simple   Destruction method: cryotherapy   Informed consent: discussed and consent obtained   Timeout:  patient name, date of birth, surgical site, and procedure verified Lesion destroyed using liquid nitrogen: Yes   Cryotherapy cycles:  1 Outcome: patient tolerated procedure well with no complications   Post-procedure details: wound care instructions given      I, Zuleica Seith, PA-C, have reviewed all documentation's for this visit.  The documentation on 08/03/20 for the exam, diagnosis, procedures and orders are all accurate and complete.

## 2020-08-23 ENCOUNTER — Inpatient Hospital Stay (HOSPITAL_BASED_OUTPATIENT_CLINIC_OR_DEPARTMENT_OTHER)
Admission: EM | Admit: 2020-08-23 | Discharge: 2020-08-27 | DRG: 291 | Disposition: A | Discharge status: Home / Self Care | Payer: Medicare HMO | Attending: Internal Medicine | Admitting: Internal Medicine

## 2020-08-23 ENCOUNTER — Telehealth: Payer: Self-pay | Admitting: Internal Medicine

## 2020-08-23 ENCOUNTER — Emergency Department (HOSPITAL_BASED_OUTPATIENT_CLINIC_OR_DEPARTMENT_OTHER): Payer: Medicare HMO | Admitting: Radiology

## 2020-08-23 ENCOUNTER — Encounter (HOSPITAL_BASED_OUTPATIENT_CLINIC_OR_DEPARTMENT_OTHER): Payer: Self-pay | Admitting: Emergency Medicine

## 2020-08-23 ENCOUNTER — Other Ambulatory Visit: Payer: Self-pay

## 2020-08-23 DIAGNOSIS — Z20822 Contact with and (suspected) exposure to covid-19: Secondary | ICD-10-CM | POA: Diagnosis present

## 2020-08-23 DIAGNOSIS — N184 Chronic kidney disease, stage 4 (severe): Secondary | ICD-10-CM | POA: Diagnosis present

## 2020-08-23 DIAGNOSIS — I25718 Atherosclerosis of autologous vein coronary artery bypass graft(s) with other forms of angina pectoris: Secondary | ICD-10-CM | POA: Diagnosis present

## 2020-08-23 DIAGNOSIS — D62 Acute posthemorrhagic anemia: Secondary | ICD-10-CM | POA: Diagnosis present

## 2020-08-23 DIAGNOSIS — I509 Heart failure, unspecified: Secondary | ICD-10-CM | POA: Diagnosis not present

## 2020-08-23 DIAGNOSIS — I4819 Other persistent atrial fibrillation: Secondary | ICD-10-CM | POA: Diagnosis present

## 2020-08-23 DIAGNOSIS — Z981 Arthrodesis status: Secondary | ICD-10-CM

## 2020-08-23 DIAGNOSIS — G4733 Obstructive sleep apnea (adult) (pediatric): Secondary | ICD-10-CM | POA: Diagnosis present

## 2020-08-23 DIAGNOSIS — E1122 Type 2 diabetes mellitus with diabetic chronic kidney disease: Secondary | ICD-10-CM | POA: Diagnosis present

## 2020-08-23 DIAGNOSIS — I130 Hypertensive heart and chronic kidney disease with heart failure and stage 1 through stage 4 chronic kidney disease, or unspecified chronic kidney disease: Secondary | ICD-10-CM | POA: Diagnosis not present

## 2020-08-23 DIAGNOSIS — D509 Iron deficiency anemia, unspecified: Secondary | ICD-10-CM | POA: Diagnosis present

## 2020-08-23 DIAGNOSIS — I442 Atrioventricular block, complete: Secondary | ICD-10-CM | POA: Diagnosis present

## 2020-08-23 DIAGNOSIS — G14 Postpolio syndrome: Secondary | ICD-10-CM | POA: Diagnosis present

## 2020-08-23 DIAGNOSIS — Z96652 Presence of left artificial knee joint: Secondary | ICD-10-CM | POA: Diagnosis present

## 2020-08-23 DIAGNOSIS — Z79899 Other long term (current) drug therapy: Secondary | ICD-10-CM

## 2020-08-23 DIAGNOSIS — Z951 Presence of aortocoronary bypass graft: Secondary | ICD-10-CM

## 2020-08-23 DIAGNOSIS — Z7984 Long term (current) use of oral hypoglycemic drugs: Secondary | ICD-10-CM

## 2020-08-23 DIAGNOSIS — Z6831 Body mass index (BMI) 31.0-31.9, adult: Secondary | ICD-10-CM

## 2020-08-23 DIAGNOSIS — Z794 Long term (current) use of insulin: Secondary | ICD-10-CM

## 2020-08-23 DIAGNOSIS — D631 Anemia in chronic kidney disease: Secondary | ICD-10-CM | POA: Diagnosis present

## 2020-08-23 DIAGNOSIS — N1832 Chronic kidney disease, stage 3b: Secondary | ICD-10-CM | POA: Diagnosis present

## 2020-08-23 DIAGNOSIS — D649 Anemia, unspecified: Secondary | ICD-10-CM

## 2020-08-23 DIAGNOSIS — I25118 Atherosclerotic heart disease of native coronary artery with other forms of angina pectoris: Secondary | ICD-10-CM | POA: Diagnosis present

## 2020-08-23 DIAGNOSIS — Z950 Presence of cardiac pacemaker: Secondary | ICD-10-CM

## 2020-08-23 DIAGNOSIS — I5033 Acute on chronic diastolic (congestive) heart failure: Secondary | ICD-10-CM | POA: Diagnosis present

## 2020-08-23 DIAGNOSIS — K219 Gastro-esophageal reflux disease without esophagitis: Secondary | ICD-10-CM | POA: Diagnosis present

## 2020-08-23 DIAGNOSIS — I495 Sick sinus syndrome: Secondary | ICD-10-CM | POA: Diagnosis present

## 2020-08-23 DIAGNOSIS — E669 Obesity, unspecified: Secondary | ICD-10-CM | POA: Diagnosis present

## 2020-08-23 DIAGNOSIS — E1169 Type 2 diabetes mellitus with other specified complication: Secondary | ICD-10-CM | POA: Diagnosis present

## 2020-08-23 LAB — COMPREHENSIVE METABOLIC PANEL
ALT: 19 U/L (ref 0–44)
AST: 19 U/L (ref 15–41)
Albumin: 3.7 g/dL (ref 3.5–5.0)
Alkaline Phosphatase: 77 U/L (ref 38–126)
Anion gap: 10 (ref 5–15)
BUN: 29 mg/dL — ABNORMAL HIGH (ref 8–23)
CO2: 20 mmol/L — ABNORMAL LOW (ref 22–32)
Calcium: 8.3 mg/dL — ABNORMAL LOW (ref 8.9–10.3)
Chloride: 106 mmol/L (ref 98–111)
Creatinine, Ser: 1.85 mg/dL — ABNORMAL HIGH (ref 0.61–1.24)
GFR, Estimated: 37 mL/min — ABNORMAL LOW (ref >60)
Glucose, Bld: 111 mg/dL — ABNORMAL HIGH (ref 70–99)
Potassium: 4.2 mmol/L (ref 3.5–5.1)
Sodium: 136 mmol/L (ref 135–145)
Total Bilirubin: 0.4 mg/dL (ref 0.3–1.2)
Total Protein: 7 g/dL (ref 6.5–8.1)

## 2020-08-23 LAB — CBC
HCT: 24.6 % — ABNORMAL LOW (ref 39.0–52.0)
Hemoglobin: 7.5 g/dL — ABNORMAL LOW (ref 13.0–17.0)
MCH: 27.1 pg (ref 26.0–34.0)
MCHC: 30.5 g/dL (ref 30.0–36.0)
MCV: 88.8 fL (ref 80.0–100.0)
Platelets: 133 10*3/uL — ABNORMAL LOW (ref 150–400)
RBC: 2.77 MIL/uL — ABNORMAL LOW (ref 4.22–5.81)
RDW: 15.1 % (ref 11.5–15.5)
WBC: 4.8 10*3/uL (ref 4.0–10.5)
nRBC: 0 % (ref 0.0–0.2)

## 2020-08-23 LAB — BRAIN NATRIURETIC PEPTIDE: B Natriuretic Peptide: 246.8 pg/mL — ABNORMAL HIGH (ref 0.0–100.0)

## 2020-08-23 LAB — TROPONIN I (HIGH SENSITIVITY)
Troponin I (High Sensitivity): 8 ng/L (ref <18)
Troponin I (High Sensitivity): 8 ng/L (ref <18)

## 2020-08-23 LAB — RESP PANEL BY RT-PCR (FLU A&B, COVID) ARPGX2
Influenza A by PCR: NEGATIVE
Influenza B by PCR: NEGATIVE
SARS Coronavirus 2 by RT PCR: NEGATIVE

## 2020-08-23 MED ORDER — FUROSEMIDE 10 MG/ML IJ SOLN
40.0000 mg | Freq: Once | INTRAMUSCULAR | Status: AC
  Administered 2020-08-23: 40 mg via INTRAVENOUS
  Filled 2020-08-23: qty 4

## 2020-08-23 NOTE — Telephone Encounter (Signed)
Spoke with pt and reviewed Sarah's recommendations. Pt states he will be going to [facility] shortly to have a CXR or CTA. Nothing further needed at this time.

## 2020-08-23 NOTE — Telephone Encounter (Signed)
Spoke with pt who states increased SOB x 1 week. Pt states getting very "winded" after walking a very short distance. Pt denies N/V/D/F/C. Pt does not currently have any inhalers. Pt states he had back surgery 4 weeks ago. Pt instructed that if symptoms got any worse he should go to ED or an Urgent care for evaluation. Judson Roch could you please advise as to Dr. Annamaria Boots is unavailable

## 2020-08-23 NOTE — ED Triage Notes (Addendum)
Pt arrives to ED with c/o of shortness of breath x3 weeks. The SOB has worsened this week and pt is now experiencing dyspnea on exertion and orthopnea. Pt reports mild increased swelling in legs and abdomen. Pts lasix was decreased just about a month ago. Pt denies CP. No fevers, chills, N/V, cough.

## 2020-08-23 NOTE — ED Provider Notes (Signed)
[facility] EMERGENCY DEPT Provider Note   CSN: [id number] Arrival date & time: 08/23/20  1750     History Chief Complaint  Patient presents with   Shortness of Breath    Alan [name] is a 80 y.o. male.  Patient is a 80 year old male with a history of CKD, CAD, CHF, GI bleeding most recently a few months ago requiring blood transfusion from bleeding stomach ulcers, hypertension, paroxysmal atrial fibrillation and pacemaker who is presenting today with worsening shortness of breath.  Patient states over the last 2 to 3 weeks he has had gradually worsening dyspnea on exertion that now he cannot walk between 1 room to another without getting short of breath.  He can lay down and sleep only if he has a CPAP on.  He is also noticed significant swelling of his legs.  His doctor is also been adjusting his Lasix now for a while and he is down to taking only 20 mg daily because he was having kidney issues.  He denies any cough, fever, abdominal pain, nausea or vomiting.  He does not check his weight regularly.   Shortness of Breath     Past Medical History:  Diagnosis Date   Anticoagulant long-term use    Arthritis    Arthropathy of left shoulder    Atypical mole    Biceps tendonitis, left    Bursitis of left shoulder    Cardiac pacemaker in situ    first insertion 2008;  genertor and new lead change 03-08-2012;   Medtronic   CHB (complete heart block) ([place])    s/p  PPM 2008   Chronic back pain    CKD (chronic kidney disease), stage III [facility])    Coronary artery disease cardiologist--- dr croitoru   hx  CABG x2  1990 and re-do 2004;  multiple PCI to [place] to 2004;    cardiac cath 2012  occluded LAD and RCA with patent grafts   Difficult intubation    " Dr. Sallyanne Kuster said that it was difficulty to get the tube in."   History of coronary angioplasty    multiple PCI to RCA ,  1990 to 2004   History of GI bleed followed by GI-- Olevia Perches [facility]  [place]   01/ 2020  upper and lower GI bleed ;  s/p  EGD with cautery of jejunal bleed and blood transfusion's   History of kidney stones    Hx of echocardiogram 06/29/2008   EF 45-50%    Hypertension    Ischemic cardiomyopathy    previously reported , ef 40-45%;  2010--- ef 45-50%;   last echo 2017 55-60%   Mixed hyperlipidemia    Nocturia    OSA on CPAP    cpap, 12, last sleep study Jan2013, sees Dr. Keturah Barre   PAF (paroxysmal atrial fibrillation) ([place])    long hx PAF--- followed by dr croitoru   Post-polio syndrome    dx polio age 23;   09-30-2018  per pt occasional gait issues and occasion fall   Presence of permanent cardiac pacemaker    Pseudoarthrosis of lumbar spine    Rotator cuff tear, left    S/P CABG x 2    1990 x2  and 2004  x2   Sinus node dysfunction ([place])    2015--- sinus node arrest  with intact AV conduction   Squamous cell carcinoma of skin    Type 2 diabetes mellitus ([place])    followed  by pcp    Patient Active Problem List   Diagnosis Date Noted   Sacroiliitis ([place]) 07/20/2020   Preoperative clearance 03/12/2019   Long term (current) use of anticoagulants 12/27/2018   Angina pectoris ([place])    Atypical atrial flutter ([facility])    S/P lumbar fusion 03/11/2018   Preop pulmonary/respiratory exam 02/26/2018   Coronary artery disease of bypass graft of native heart with stable angina pectoris ([place]) 06/29/2017   Leg length difference, acquired 06/26/2016   S/P lumbar spinal fusion 05/18/2016   Idiopathic chronic gout of multiple sites with tophus 05/01/2016   Idiopathic chronic gout of right foot without tophus 04/05/2016   Tophaceous gout 04/05/2016   Mild obesity 03/16/2016   Recurrent left inguinal hernia 03/16/2016   Allergic rhinitis 09/30/2015   Acute confusional state 09/27/2015   Memory loss 09/27/2015   Right inguinal hernia 07/17/2014   Ventral hernia without obstruction or gangrene 07/17/2014   Acute blood loss anemia 07/04/2013   CKD  (chronic kidney disease), stage III ([place]) 07/04/2013   GI bleed 03/19/2013   Melena 03/17/2013   CHB (complete heart block) ([place]) 10/13/2012   Dyslipidemia 07/11/2012   Preop cardiovascular exam 07/11/2012   Ischemic cardiomyopathy 07/11/2012   Myocardial ischemia 07/11/2012   Pacemaker - Medtronic Adapta dual chamber 03/08/2012   Non-sustained ventricular tachycardia ([place]) 12/21/2010   Essential hypertension 12/21/2010   Paroxysmal ventricular tachycardia ([place]) 12/21/2010   SSS (sick sinus syndrome) ([place])    Hyperlipidemia 11/03/2010   Restrictive lung disease 12/23/2009   POST-POLIO SYNDROME 12/12/2007   Diabetes mellitus type 2 in obese ([place]) 12/12/2007   Obstructive sleep apnea 12/12/2007   Coronary atherosclerosis 12/12/2007   Persistent atrial fibrillation ([place]) 12/12/2007   CAD s/p redo CABG 2004 with stable angina pectoris ([place]) 12/12/2007   Status post aorto-coronary artery bypass graft 12/12/2007    Past Surgical History:  Procedure Laterality Date   APPENDECTOMY  child   APPLICATION OF INTRAOPERATIVE CT SCAN N/A 07/20/2020   Procedure: APPLICATION OF INTRAOPERATIVE CT SCAN;  Surgeon: Karsten Ro, DO;  Location: [facility];  Service: Neurosurgery;  Laterality: N/A;   BLEPHAROPLASTY Bilateral 12/2014   upper eyelid   CARDIAC PACEMAKER PLACEMENT  2008   CARDIOVERSION N/A 01/15/2019   Procedure: CARDIOVERSION;  Surgeon: Sanda Klein, MD;  Location: [facility]COPY;  Service: Cardiovascular;  Laterality: N/A;   CARPAL TUNNEL RELEASE Bilateral 2015;  2016   CATARACT EXTRACTION W/ INTRAOCULAR LENS  IMPLANT, BILATERAL  2017   CORONARY ANGIOPLASTY  1990  to 2004   multiple to [place]    @ [place] (dr chitwood)   seq. LIMA to LAD and Diagonal   CORONARY ARTERY BYPASS GRAFT  2004     dr Amador Cunas   SVG to  RCA and PDA;  ALSO MAZE  PROCEDURE   ESOPHAGOGASTRODUODENOSCOPY Left 03/18/2013   Procedure: ESOPHAGOGASTRODUODENOSCOPY (EGD);  Surgeon:  Cleotis Nipper, MD;  Location: [facility] ENDOSCOPY;  Service: Endoscopy;  Laterality: Left;   HARDWARE REMOVAL N/A 05/18/2016   Procedure: Removal of broken hardware Lumbar three-four;  Surgeon: [name], MD;  Location: [facility];  Service: Neurosurgery;  Laterality: N/A;   HERNIA REPAIR     LEFT HEART CATH AND CORS/GRAFTS ANGIOGRAPHY N/A 12/18/2018   Procedure: LEFT HEART CATH AND CORS/GRAFTS ANGIOGRAPHY;  Surgeon: Wellington Hampshire, MD;  Location: [facility] CV LAB;  Service: Cardiovascular;  Laterality: N/A;   LEFT HEART CATHETERIZATION WITH CORONARY/GRAFT ANGIOGRAM N/A 12/21/2010  Procedure: LEFT HEART CATHETERIZATION WITH Beatrix Fetters;  Surgeon: Leonie Man, MD;  Location: [facility] CATH LAB;  Service: Cardiovascular;  Laterality: N/A;   LEG SURGERY Right 2002  approx.   lengthened his leg   LUMBAR LAMINECTOMY/DECOMPRESSION MICRODISCECTOMY  01/10/2012   Procedure: LUMBAR LAMINECTOMY/DECOMPRESSION MICRODISCECTOMY 1 LEVEL;  Surgeon: [name], MD;  Location: [facility]O [facility];  Service: Neurosurgery;  Laterality: Bilateral;  Lumbar three-four decompression, Posterior lateral fusion lumbar three-four, posterior spinus plate lumbar three-four   LUMBAR LAMINECTOMY/DECOMPRESSION MICRODISCECTOMY Left 05/18/2016   Procedure: Laminectomy and Foraminotomy - Lumbar two-lumbar three- Lumbar four-lumbar five- left with removal hardware lumbar three-four;  Surgeon: [name], MD;  Location: [facility];  Service: Neurosurgery;  Laterality: Left;   NASAL SEPTUM SURGERY  yrs ago   PACEMAKER REVISION N/A 03/07/2012   Procedure: PACEMAKER REVISION;  Surgeon: Sanda Klein, MD;  Location: [facility] CATH LAB;  Service: Cardiovascular;  Laterality: N/A;   POSTERIOR LUMBAR FUSION  02-22-2017   dr Ronnald Ramp  @ [facility]   re-do laminectomy L2-3 and fusion   POSTERIOR LUMBAR FUSION  03-11-2018   dr Ronnald Ramp @ [facility]   laminectomy L4-5;  fixation L2-S1 and fusion L3-S1   SACROILIAC JOINT FUSION N/A 07/20/2020   Procedure: Sacroiliac Joint  Fusion;  Surgeon: Karsten Ro, DO;  Location: [facility];  Service: Neurosurgery;  Laterality: N/A;   SHOULDER ARTHROSCOPY Right after 2016, pt unsure year   SHOULDER ARTHROSCOPY WITH OPEN ROTATOR CUFF REPAIR Right 03/30/2014   Procedure: RIGHT SHOULDER ARTHROSCOPY  OPEN ROTATOR CUFF REPAIR;  Surgeon: Kerin Salen, MD;  Location: [facility];  Service: Orthopedics;  Laterality: Right;   SHOULDER ARTHROSCOPY WITH SUBACROMIAL DECOMPRESSION, ROTATOR CUFF REPAIR AND BICEP TENDON REPAIR Left 10/01/2018   Procedure: LEFT SHOULDER ARTHROSCOPY, DISTAL CLAVICLE EXCISION,SUBACROMIAL, DECOMPRESSION, RORATOR CUFF REPAIR, BICEPS TENODESIS;  Surgeon: Renette Butters, MD;  Location: [facility];  Service: Orthopedics;  Laterality: Left;   SHOULDER OPEN ROTATOR CUFF REPAIR Right 03/30/2014   Procedure: ROTATOR CUFF REPAIR SHOULDER OPEN;  Surgeon: Kerin Salen, MD;  Location: [facility];  Service: Orthopedics;  Laterality: Right;   TEE WITHOUT CARDIOVERSION N/A 01/15/2019   Procedure: TRANSESOPHAGEAL ECHOCARDIOGRAM (TEE);  Surgeon: Sanda Klein, MD;  Location: [facility] ENDOSCOPY;  Service: Cardiovascular;  Laterality: N/A;   TONSILLECTOMY  child   TOTAL KNEE ARTHROPLASTY Left 2006       Family History  Problem Relation Age of Onset   Other Father        MVA   Cancer Mother     Social History   Tobacco Use   Smoking status: Never   Smokeless tobacco: Never  Vaping Use   Vaping Use: Never used  Substance Use Topics   Alcohol use: Not Currently   Drug use: Never    Home Medications Prior to Admission medications   Medication Sig Start Date End Date Taking? Authorizing Provider  amiodarone (PACERONE) 200 MG tablet Take 200 mg by mouth daily. 07/05/20   [provider]  amLODipine (NORVASC) 10 MG tablet Take 10 mg by mouth daily.  04/23/17   [provider]  carvedilol (COREG) 25 MG tablet TAKE 2 TABLETS BY MOUTH TWICE DAILY WITH  A  MEAL. APPOINTMNT REQUIRED FOR FUTURE REFILLS.  07/01/20   [name], Dani Gobble, MD  fluticasone (FLONASE) 50 MCG/ACT nasal spray Place 2 sprays into both nostrils daily as needed for allergies.  02/11/18 04/07/19  [provider]  furosemide (LASIX) 40 MG tablet Take 20 mg by  mouth every other day. 07/10/20   [provider]  glipiZIDE (GLUCOTROL) 10 MG tablet Take 10 mg by mouth 2 (two) times daily.    [provider]  HYDROcodone-acetaminophen (NORCO) 10-325 MG tablet Take 1 tablet by mouth every 4 (four) hours as needed for moderate pain ((score 4 to 6)). 07/22/20   [name], Troy C, DO  insulin lispro (HUMALOG) 100 UNIT/ML injection Inject 2-8 Units into the skin as needed for high blood sugar.     [provider]  losartan (COZAAR) 100 MG tablet Take 100 mg by mouth daily.     [provider]  metFORMIN (GLUCOPHAGE) 500 MG tablet Take 500 mg by mouth 2 (two) times daily with a meal. Takes with 500mg  XR    [provider]  metFORMIN (GLUCOPHAGE-XR) 500 MG 24 hr tablet Take 500 mg by mouth 2 (two) times daily. 07/01/20   [provider]  methocarbamol (ROBAXIN) 500 MG tablet Take 1 tablet (500 mg total) by mouth every 6 (six) hours as needed for muscle spasms. 07/22/20   [name], Theodoro Doing, DO  Multiple Vitamin (MULTIVITAMIN WITH MINERALS) TABS tablet Take 1 tablet by mouth daily.     [provider]  pantoprazole (PROTONIX) 40 MG tablet Take 40 mg by mouth 2 (two) times daily. 07/08/20   [provider]  Potassium Chloride ER 20 MEQ TBCR Take 1 tablet by mouth once daily 07/01/20   [name], Dani Gobble, MD    Allergies    Patient has no known allergies.  Review of Systems   Review of Systems  Respiratory:  Positive for shortness of breath.   All other systems reviewed and are negative.  Physical Exam Updated Vital Signs BP (!) 159/74 (BP Location: Right Arm)   Pulse 64   Temp 98.3 F (36.8 C) (Oral)   Resp (!) 22   Ht 5\' 9"  (1.753 m)   Wt 102.1 kg   SpO2 96%   BMI 33.23  kg/m   Physical Exam Vitals and nursing note reviewed.  Constitutional:      General: He is not in acute distress.    Appearance: He is well-developed.  HENT:     Head: Normocephalic and atraumatic.     Mouth/Throat:     Mouth: Mucous membranes are moist.  Eyes:     Conjunctiva/sclera: Conjunctivae normal.     Pupils: Pupils are equal, round, and reactive to light.  Cardiovascular:     Rate and Rhythm: Normal rate and regular rhythm.     Pulses: Normal pulses.     Heart sounds: No murmur heard. Pulmonary:     Effort: Pulmonary effort is normal. No respiratory distress.     Breath sounds: Examination of the right-lower field reveals decreased breath sounds. Examination of the left-lower field reveals decreased breath sounds. Decreased breath sounds present. No wheezing or rales.  Abdominal:     General: There is no distension.     Palpations: Abdomen is soft.     Tenderness: There is no abdominal tenderness. There is no guarding or rebound.  Musculoskeletal:        General: Normal range of motion.     Cervical back: Normal range of motion and neck supple.     Right lower leg: Tenderness present. Edema present.     Left lower leg: Tenderness present. Edema present.     Comments: 3+ pitting edema to the midshin  Skin:    General: Skin is warm and dry.  Findings: No erythema or rash.  Neurological:     Mental Status: He is alert and oriented to person, place, and time.  Psychiatric:        Behavior: Behavior normal.    ED Results / Procedures / Treatments   Labs (all labs ordered are listed, but only abnormal results are displayed) Labs Reviewed  CBC - Abnormal; Notable for the following components:      Result Value   RBC 2.77 (*)    Hemoglobin 7.5 (*)    HCT 24.6 (*)    Platelets 133 (*)    All other components within normal limits  COMPREHENSIVE METABOLIC PANEL - Abnormal; Notable for the following components:   CO2 20 (*)    Glucose, Bld 111 (*)    BUN 29 (*)     Creatinine, Ser 1.85 (*)    Calcium 8.3 (*)    GFR, Estimated 37 (*)    All other components within normal limits  RESP PANEL BY RT-PCR (FLU A&B, COVID) ARPGX2  BRAIN NATRIURETIC PEPTIDE  TROPONIN I (HIGH SENSITIVITY)  TROPONIN I (HIGH SENSITIVITY)    EKG EKG Interpretation  Date/Time:  Monday August 23 2020 18:22:00 EDT Ventricular Rate:  67 PR Interval:  304 QRS Duration: 160 QT Interval:  458 QTC Calculation: 483 R Axis:   -48 Text Interpretation: Atrial-paced rhythm with prolonged AV conduction No significant change since last tracing Confirmed by Blanchie Dessert [phone]) on 08/23/2020 8:27:58 PM  Radiology DG Chest 2 View  Result Date: 08/23/2020 CLINICAL DATA:  Shortness of breath. EXAM: CHEST - 2 VIEW COMPARISON:  Chest x-ray 03/10/2020, CT chest 10/15/2009 FINDINGS: Left chest wall cardiac pacemaker with 3 leads in similar position. The heart size and mediastinal contours are unchanged. Aortic calcification. Cardiac surgical changes overlie the mediastinum. Coronary artery stent. No focal consolidation. No pulmonary edema. Interval development of a trace right pleural effusion. Possible trace left pleural effusion. No pneumothorax. No acute osseous abnormality. IMPRESSION: Interval development of a trace right pleural effusion. Possible trace left pleural effusion. Electronically Signed   By: Iven Finn M.D.   On: 08/23/2020 18:53    Procedures Procedures   Medications Ordered in ED Medications  furosemide (LASIX) injection 40 mg (40 mg Intravenous Given 08/23/20 2221)    ED Course  I have reviewed the triage vital signs and the nursing notes.  Pertinent labs & imaging results that were available during my care of the patient were reviewed by me and considered in my medical decision making (see chart for details).    MDM Rules/Calculators/A&P                          Elderly male with multiple medical problems presenting today with worsening dyspnea on  exertion that is worsened over the last 2 to 3 weeks.  Patient has obvious signs of fluid overload today on exam.  At rest he is in no acute distress but is still slightly tachypneic.  He has been having his Lasix dose decreased because of worsening renal function.  Patient's physicians are with [facility] and he also sees Dr. Loletha Grayer with cardiology at [facility].  Pacemaker appears to be capturing today without any signs of atrial fibrillation.  Patient CBC with hemoglobin of 7.5.  Patient's hemoglobin was last checked at the beginning of June and at that time was 8 and that was after having blood transfusion for upper GI bleed in the jejunum requiring cauterization.  Since then  patient has had back surgery 4 weeks ago but has not noticed any dark stool since that time.  CMP with creatinine today of 1.85 from 1.9 over a month ago initial troponin within normal limits and chest x-ray with right pleural effusion and left pleural effusion that are small in nature.  Suspect that patient is fluid overloaded due to the decrease of his diuretic related to his worsening renal function.  Hemoglobin has changed 1 g but concerned that that might be delusional as patient has not noticed any new black stools lower suspicion for acute bleed at this time.  BNP is pending but patient given IV Lasix.  Given patient's anemia, persistent elevated renal function will admit for diuresis, following hemoglobin and renal function  CRITICAL CARE Performed by: Abdulah Iqbal Total critical care time: 30 minutes Critical care time was exclusive of separately billable procedures and treating other patients. Critical care was necessary to treat or prevent imminent or life-threatening deterioration. Critical care was time spent personally by me on the following activities: development of treatment plan with patient and/or surrogate as well as nursing, discussions with consultants, evaluation of patient's response to treatment, examination of patient,  obtaining history from patient or surrogate, ordering and performing treatments and interventions, ordering and review of laboratory studies, ordering and review of radiographic studies, pulse oximetry and re-evaluation of patient's condition.  MDM   Amount and/or Complexity of Data Reviewed Clinical lab tests: ordered and reviewed Tests in the radiology section of CPT: ordered and reviewed Tests in the medicine section of CPT: ordered and reviewed Decide to obtain previous medical records or to obtain history from someone other than the patient: yes Obtain history from someone other than the patient: yes Review and summarize past medical records: yes Discuss the patient with other providers: yes Independent visualization of images, tracings, or specimens: yes  Risk of Complications, Morbidity, and/or Mortality Presenting problems: moderate Diagnostic procedures: moderate Management options: moderate  Patient Progress Patient progress: stable   Final Clinical Impression(s) / ED Diagnoses Final diagnoses:  Acute on chronic congestive heart failure, unspecified heart failure type ([place])  Anemia, unspecified type    Rx / DC Orders ED Discharge Orders     None        Blanchie Dessert, MD 08/23/20 2247

## 2020-08-24 ENCOUNTER — Other Ambulatory Visit: Payer: Self-pay

## 2020-08-24 ENCOUNTER — Encounter (HOSPITAL_COMMUNITY): Payer: Self-pay | Admitting: Family Medicine

## 2020-08-24 ENCOUNTER — Inpatient Hospital Stay (HOSPITAL_COMMUNITY): Payer: Medicare HMO

## 2020-08-24 DIAGNOSIS — N183 Chronic kidney disease, stage 3 unspecified: Secondary | ICD-10-CM

## 2020-08-24 DIAGNOSIS — I5033 Acute on chronic diastolic (congestive) heart failure: Secondary | ICD-10-CM | POA: Diagnosis present

## 2020-08-24 DIAGNOSIS — I509 Heart failure, unspecified: Secondary | ICD-10-CM | POA: Diagnosis present

## 2020-08-24 DIAGNOSIS — Z6831 Body mass index (BMI) 31.0-31.9, adult: Secondary | ICD-10-CM | POA: Diagnosis not present

## 2020-08-24 DIAGNOSIS — I25810 Atherosclerosis of coronary artery bypass graft(s) without angina pectoris: Secondary | ICD-10-CM

## 2020-08-24 DIAGNOSIS — D509 Iron deficiency anemia, unspecified: Secondary | ICD-10-CM | POA: Diagnosis present

## 2020-08-24 DIAGNOSIS — Z79899 Other long term (current) drug therapy: Secondary | ICD-10-CM

## 2020-08-24 DIAGNOSIS — K219 Gastro-esophageal reflux disease without esophagitis: Secondary | ICD-10-CM | POA: Diagnosis present

## 2020-08-24 DIAGNOSIS — Z951 Presence of aortocoronary bypass graft: Secondary | ICD-10-CM | POA: Diagnosis not present

## 2020-08-24 DIAGNOSIS — I480 Paroxysmal atrial fibrillation: Secondary | ICD-10-CM

## 2020-08-24 DIAGNOSIS — I5031 Acute diastolic (congestive) heart failure: Secondary | ICD-10-CM

## 2020-08-24 DIAGNOSIS — D649 Anemia, unspecified: Secondary | ICD-10-CM

## 2020-08-24 DIAGNOSIS — I25118 Atherosclerotic heart disease of native coronary artery with other forms of angina pectoris: Secondary | ICD-10-CM | POA: Diagnosis present

## 2020-08-24 DIAGNOSIS — G14 Postpolio syndrome: Secondary | ICD-10-CM | POA: Diagnosis present

## 2020-08-24 DIAGNOSIS — Z950 Presence of cardiac pacemaker: Secondary | ICD-10-CM | POA: Diagnosis not present

## 2020-08-24 DIAGNOSIS — Z96652 Presence of left artificial knee joint: Secondary | ICD-10-CM | POA: Diagnosis present

## 2020-08-24 DIAGNOSIS — I442 Atrioventricular block, complete: Secondary | ICD-10-CM | POA: Diagnosis present

## 2020-08-24 DIAGNOSIS — E1122 Type 2 diabetes mellitus with diabetic chronic kidney disease: Secondary | ICD-10-CM | POA: Diagnosis present

## 2020-08-24 DIAGNOSIS — D62 Acute posthemorrhagic anemia: Secondary | ICD-10-CM | POA: Diagnosis present

## 2020-08-24 DIAGNOSIS — I495 Sick sinus syndrome: Secondary | ICD-10-CM

## 2020-08-24 DIAGNOSIS — Z20822 Contact with and (suspected) exposure to covid-19: Secondary | ICD-10-CM | POA: Diagnosis present

## 2020-08-24 DIAGNOSIS — D500 Iron deficiency anemia secondary to blood loss (chronic): Secondary | ICD-10-CM

## 2020-08-24 DIAGNOSIS — E669 Obesity, unspecified: Secondary | ICD-10-CM | POA: Diagnosis present

## 2020-08-24 DIAGNOSIS — Z981 Arthrodesis status: Secondary | ICD-10-CM | POA: Diagnosis not present

## 2020-08-24 DIAGNOSIS — Z794 Long term (current) use of insulin: Secondary | ICD-10-CM | POA: Diagnosis not present

## 2020-08-24 DIAGNOSIS — D631 Anemia in chronic kidney disease: Secondary | ICD-10-CM | POA: Diagnosis present

## 2020-08-24 DIAGNOSIS — Z7984 Long term (current) use of oral hypoglycemic drugs: Secondary | ICD-10-CM | POA: Diagnosis not present

## 2020-08-24 DIAGNOSIS — I130 Hypertensive heart and chronic kidney disease with heart failure and stage 1 through stage 4 chronic kidney disease, or unspecified chronic kidney disease: Secondary | ICD-10-CM | POA: Diagnosis present

## 2020-08-24 DIAGNOSIS — I4819 Other persistent atrial fibrillation: Secondary | ICD-10-CM | POA: Diagnosis present

## 2020-08-24 DIAGNOSIS — N1832 Chronic kidney disease, stage 3b: Secondary | ICD-10-CM | POA: Diagnosis present

## 2020-08-24 DIAGNOSIS — E1169 Type 2 diabetes mellitus with other specified complication: Secondary | ICD-10-CM | POA: Diagnosis not present

## 2020-08-24 DIAGNOSIS — G4733 Obstructive sleep apnea (adult) (pediatric): Secondary | ICD-10-CM

## 2020-08-24 DIAGNOSIS — I25718 Atherosclerosis of autologous vein coronary artery bypass graft(s) with other forms of angina pectoris: Secondary | ICD-10-CM | POA: Diagnosis not present

## 2020-08-24 LAB — PHOSPHORUS: Phosphorus: 3.4 mg/dL (ref 2.5–4.6)

## 2020-08-24 LAB — GLUCOSE, CAPILLARY
Glucose-Capillary: 153 mg/dL — ABNORMAL HIGH (ref 70–99)
Glucose-Capillary: 153 mg/dL — ABNORMAL HIGH (ref 70–99)

## 2020-08-24 LAB — HEMOGLOBIN A1C
Hgb A1c MFr Bld: 6.4 % — ABNORMAL HIGH (ref 4.8–5.6)
Mean Plasma Glucose: 136.98 mg/dL

## 2020-08-24 LAB — ECHOCARDIOGRAM COMPLETE
AR max vel: 3.19 cm2
AV Area VTI: 3.25 cm2
AV Area mean vel: 3.15 cm2
AV Mean grad: 3 mmHg
AV Peak grad: 5.7 mmHg
Ao pk vel: 1.19 m/s
Area-P 1/2: 2.24 cm2
Height: 69 in
MV VTI: 2.79 cm2
S' Lateral: 3.9 cm
Weight: 3600 oz

## 2020-08-24 LAB — CBC
HCT: 24.9 % — ABNORMAL LOW (ref 39.0–52.0)
Hemoglobin: 7.8 g/dL — ABNORMAL LOW (ref 13.0–17.0)
MCH: 27.6 pg (ref 26.0–34.0)
MCHC: 31.3 g/dL (ref 30.0–36.0)
MCV: 88 fL (ref 80.0–100.0)
Platelets: 147 10*3/uL — ABNORMAL LOW (ref 150–400)
RBC: 2.83 MIL/uL — ABNORMAL LOW (ref 4.22–5.81)
RDW: 14.8 % (ref 11.5–15.5)
WBC: 5.6 10*3/uL (ref 4.0–10.5)
nRBC: 0 % (ref 0.0–0.2)

## 2020-08-24 LAB — MAGNESIUM: Magnesium: 2.2 mg/dL (ref 1.7–2.4)

## 2020-08-24 LAB — CREATININE, SERUM
Creatinine, Ser: 1.94 mg/dL — ABNORMAL HIGH (ref 0.61–1.24)
GFR, Estimated: 35 mL/min — ABNORMAL LOW (ref >60)

## 2020-08-24 MED ORDER — AMLODIPINE BESYLATE 10 MG PO TABS
10.0000 mg | ORAL_TABLET | Freq: Every day | ORAL | Status: DC
  Administered 2020-08-24 – 2020-08-27 (×4): 10 mg via ORAL
  Filled 2020-08-24 (×4): qty 1

## 2020-08-24 MED ORDER — INSULIN ASPART 100 UNIT/ML IJ SOLN: 0.0000 [IU] | Freq: Every day | INTRAMUSCULAR | Status: DC

## 2020-08-24 MED ORDER — IPRATROPIUM-ALBUTEROL 0.5-2.5 (3) MG/3ML IN SOLN
3.0000 mL | Freq: Four times a day (QID) | RESPIRATORY_TRACT | Status: DC
  Administered 2020-08-24: 3 mL via RESPIRATORY_TRACT
  Filled 2020-08-24: qty 3

## 2020-08-24 MED ORDER — LOSARTAN POTASSIUM 50 MG PO TABS
100.0000 mg | ORAL_TABLET | Freq: Every day | ORAL | Status: DC
  Administered 2020-08-24 – 2020-08-27 (×4): 100 mg via ORAL
  Filled 2020-08-24 (×4): qty 2

## 2020-08-24 MED ORDER — AMIODARONE HCL 200 MG PO TABS
200.0000 mg | ORAL_TABLET | Freq: Every day | ORAL | Status: DC
  Administered 2020-08-24 – 2020-08-27 (×4): 200 mg via ORAL
  Filled 2020-08-24 (×4): qty 1

## 2020-08-24 MED ORDER — INSULIN ASPART 100 UNIT/ML IJ SOLN
0.0000 [IU] | Freq: Three times a day (TID) | INTRAMUSCULAR | Status: DC
  Administered 2020-08-24: 2 [IU] via SUBCUTANEOUS
  Administered 2020-08-25: 1 [IU] via SUBCUTANEOUS
  Administered 2020-08-25: 2 [IU] via SUBCUTANEOUS
  Administered 2020-08-25: 1 [IU] via SUBCUTANEOUS
  Administered 2020-08-26: 2 [IU] via SUBCUTANEOUS
  Administered 2020-08-26: 1 [IU] via SUBCUTANEOUS
  Administered 2020-08-26: 2 [IU] via SUBCUTANEOUS
  Administered 2020-08-27: 1 [IU] via SUBCUTANEOUS
  Administered 2020-08-27: 2 [IU] via SUBCUTANEOUS

## 2020-08-24 MED ORDER — FUROSEMIDE 10 MG/ML IJ SOLN
20.0000 mg | Freq: Two times a day (BID) | INTRAMUSCULAR | Status: DC
  Administered 2020-08-24 (×2): 20 mg via INTRAVENOUS
  Filled 2020-08-24 (×2): qty 2

## 2020-08-24 MED ORDER — PANTOPRAZOLE SODIUM 40 MG PO TBEC
40.0000 mg | DELAYED_RELEASE_TABLET | Freq: Two times a day (BID) | ORAL | Status: DC
  Administered 2020-08-24 – 2020-08-27 (×7): 40 mg via ORAL
  Filled 2020-08-24 (×7): qty 1

## 2020-08-24 MED ORDER — ADULT MULTIVITAMIN W/MINERALS CH
1.0000 | ORAL_TABLET | Freq: Every day | ORAL | Status: DC
  Administered 2020-08-24 – 2020-08-27 (×4): 1 via ORAL
  Filled 2020-08-24 (×4): qty 1

## 2020-08-24 MED ORDER — CARVEDILOL 25 MG PO TABS
25.0000 mg | ORAL_TABLET | Freq: Two times a day (BID) | ORAL | Status: DC
  Administered 2020-08-24 – 2020-08-27 (×6): 25 mg via ORAL
  Filled 2020-08-24 (×6): qty 1

## 2020-08-24 MED ORDER — IPRATROPIUM-ALBUTEROL 0.5-2.5 (3) MG/3ML IN SOLN: 3.0000 mL | Freq: Four times a day (QID) | RESPIRATORY_TRACT | Status: DC | PRN

## 2020-08-24 MED ORDER — ENOXAPARIN SODIUM 40 MG/0.4ML IJ SOSY
40.0000 mg | PREFILLED_SYRINGE | INTRAMUSCULAR | Status: DC
  Administered 2020-08-24 – 2020-08-27 (×3): 40 mg via SUBCUTANEOUS
  Filled 2020-08-24 (×4): qty 0.4

## 2020-08-24 MED ORDER — HYDROCODONE-ACETAMINOPHEN 10-325 MG PO TABS: 1.0000 | ORAL_TABLET | ORAL | Status: DC | PRN

## 2020-08-24 NOTE — Progress Notes (Signed)
  Echocardiogram 2D Echocardiogram has been performed.  Alan [name] 08/24/2020, 10:26 AM

## 2020-08-24 NOTE — H&P (Signed)
History and Physical  Alan [name] KKX:[id number] DOB: 04-04-1940 DOA: 08/23/2020  Referring physician: Direct admission from Dr. Tonie Griffith, [facility] PCP: [name], MD  Outpatient Specialists: Cardiology, pulmonology, neurosurgery, GI Patient coming from: Home.  Chief Complaint: Shortness of breath  HPI: Alan [name] is a 80 y.o. male with medical history significant for childhood polio, right SI fusion (07/20/20), chronic anemia, paroxysmal A. fib on Eliquis, hypertension, type 2 diabetes, GERD, who presented initially to [place] ED at the recommendation of his pulmonologist due to progressively worsening nonexertional dyspnea.  Associated with lower extremity edema, right JVD, elevated BNP.  From [facility], patient was transferred to [facility] 3 E. unit as a direct admission for further evaluation and management, accepted by Dr. Tonie Griffith, [facility].  ED Course:  Temperature 97.7.  BP 122/58, pulse 63, respiration rate 18, O2 saturation 97% on room air.  Lab studies remarkable for serum sodium 137, potassium 5.2, serum bicarb 19, serum glucose 161, BUN 36, creatinine 1.94, GFR 35.  BNP 246.  Troponin 8, 8.  Review of Systems: Review of systems as noted in the HPI. All other systems reviewed and are negative.   Past Medical History:  Diagnosis Date   Anticoagulant long-term use    Arthritis    Arthropathy of left shoulder    Atypical mole    Biceps tendonitis, left    Bursitis of left shoulder    Cardiac pacemaker in situ    first insertion 2008;  genertor and new lead change 03-08-2012;   [employer]   CHB (complete heart block) ([place])    s/p  PPM 2008   Chronic back pain    CKD (chronic kidney disease), stage III [facility])    Coronary artery disease cardiologist--- dr croitoru   hx  CABG x2  1990 and re-do 2004;  multiple PCI to [place] to 2004;    cardiac cath 2012  occluded LAD and RCA with patent grafts   Difficult intubation    " Dr. Sallyanne Kuster said that it was  difficulty to get the tube in."   History of coronary angioplasty    multiple PCI to RCA ,  1990 to 2004   History of GI bleed followed by GI-- Olevia Perches PA @ [facility] in [place]   01/ 2020  upper and lower GI bleed ;  s/p  EGD with cautery of jejunal bleed and blood transfusion's   History of kidney stones    Hx of echocardiogram 06/29/2008   EF 45-50%    Hypertension    Ischemic cardiomyopathy    previously reported , ef 40-45%;  2010--- ef 45-50%;   last echo 2017 55-60%   Mixed hyperlipidemia    Nocturia    OSA on CPAP    cpap, 12, last sleep study Jan2013, sees Dr. Keturah Barre   PAF (paroxysmal atrial fibrillation) ([place])    long hx PAF--- followed by dr croitoru   Post-polio syndrome    dx polio age 46;   09-30-2018  per pt occasional gait issues and occasion fall   Presence of permanent cardiac pacemaker    Pseudoarthrosis of lumbar spine    Rotator cuff tear, left    S/P CABG x 2    1990 x2  and 2004  x2   Sinus node dysfunction ([place])    2015--- sinus node arrest  with intact AV conduction   Squamous cell carcinoma of skin    Type 2 diabetes mellitus ([place])    followed  by pcp   Past Surgical History:  Procedure Laterality Date   APPENDECTOMY  child   APPLICATION OF INTRAOPERATIVE CT SCAN N/A 07/20/2020   Procedure: APPLICATION OF INTRAOPERATIVE CT SCAN;  Surgeon: [name], Theodoro Doing, DO;  Location: [place];  Service: Neurosurgery;  Laterality: N/A;   BLEPHAROPLASTY Bilateral 12/2014   upper eyelid   CARDIAC PACEMAKER PLACEMENT  2008   CARDIOVERSION N/A 01/15/2019   Procedure: CARDIOVERSION;  Surgeon: Sanda Klein, MD;  Location: [place] ENDOSCOPY;  Service: Cardiovascular;  Laterality: N/A;   CARPAL TUNNEL RELEASE Bilateral 2015;  2016   CATARACT EXTRACTION W/ INTRAOCULAR LENS  IMPLANT, BILATERAL  2017   CORONARY ANGIOPLASTY  1990  to 2004   multiple to [place]    @ [place] (dr chitwood)   seq. LIMA to LAD and Diagonal    CORONARY ARTERY BYPASS GRAFT  2004     dr Amador Cunas   SVG to  RCA and PDA;  ALSO MAZE  PROCEDURE   ESOPHAGOGASTRODUODENOSCOPY Left 03/18/2013   Procedure: ESOPHAGOGASTRODUODENOSCOPY (EGD);  Surgeon: Cleotis Nipper, MD;  Location: [facility] ENDOSCOPY;  Service: Endoscopy;  Laterality: Left;   HARDWARE REMOVAL N/A 05/18/2016   Procedure: Removal of broken hardware Lumbar three-four;  Surgeon: [name], MD;  Location: [place];  Service: Neurosurgery;  Laterality: N/A;   HERNIA REPAIR     LEFT HEART CATH AND CORS/GRAFTS ANGIOGRAPHY N/A 12/18/2018   Procedure: LEFT HEART CATH AND CORS/GRAFTS ANGIOGRAPHY;  Surgeon: Wellington Hampshire, MD;  Location: [place] CV LAB;  Service: Cardiovascular;  Laterality: N/A;   LEFT HEART CATHETERIZATION WITH CORONARY/GRAFT ANGIOGRAM N/A 12/21/2010   Procedure: LEFT HEART CATHETERIZATION WITH Beatrix Fetters;  Surgeon: Leonie Man, MD;  Location: [facility] CATH LAB;  Service: Cardiovascular;  Laterality: N/A;   LEG SURGERY Right 2002  approx.   lengthened his leg   LUMBAR LAMINECTOMY/DECOMPRESSION MICRODISCECTOMY  01/10/2012   Procedure: LUMBAR LAMINECTOMY/DECOMPRESSION MICRODISCECTOMY 1 LEVEL;  Surgeon: [name], MD;  Location: [place] NEURO ORS;  Service: Neurosurgery;  Laterality: Bilateral;  Lumbar three-four decompression, Posterior lateral fusion lumbar three-four, posterior spinus plate lumbar three-four   LUMBAR LAMINECTOMY/DECOMPRESSION MICRODISCECTOMY Left 05/18/2016   Procedure: Laminectomy and Foraminotomy - Lumbar two-lumbar three- Lumbar four-lumbar five- left with removal hardware lumbar three-four;  Surgeon: [name], MD;  Location: [place];  Service: Neurosurgery;  Laterality: Left;   NASAL SEPTUM SURGERY  yrs ago   PACEMAKER REVISION N/A 03/07/2012   Procedure: PACEMAKER REVISION;  Surgeon: Sanda Klein, MD;  Location: [place] CATH LAB;  Service: Cardiovascular;  Laterality: N/A;   POSTERIOR LUMBAR FUSION  02-22-2017   dr Ronnald Ramp  @ [facility]   re-do  laminectomy L2-3 and fusion   POSTERIOR LUMBAR FUSION  03-11-2018   dr Ronnald Ramp @ [facility]   laminectomy L4-5;  fixation L2-S1 and fusion L3-S1   SACROILIAC JOINT FUSION N/A 07/20/2020   Procedure: Sacroiliac Joint Fusion;  Surgeon: Karsten Ro, DO;  Location: [place];  Service: Neurosurgery;  Laterality: N/A;   SHOULDER ARTHROSCOPY Right after 2016, pt unsure year   SHOULDER ARTHROSCOPY WITH OPEN ROTATOR CUFF REPAIR Right 03/30/2014   Procedure: RIGHT SHOULDER ARTHROSCOPY  OPEN ROTATOR CUFF REPAIR;  Surgeon: Kerin Salen, MD;  Location: [place];  Service: Orthopedics;  Laterality: Right;   SHOULDER ARTHROSCOPY WITH SUBACROMIAL DECOMPRESSION, ROTATOR CUFF REPAIR AND BICEP TENDON REPAIR Left 10/01/2018   Procedure: LEFT SHOULDER ARTHROSCOPY, DISTAL CLAVICLE EXCISION,SUBACROMIAL, DECOMPRESSION, RORATOR CUFF REPAIR, BICEPS  TENODESIS;  Surgeon: Renette Butters, MD;  Location: [facility];  Service: Orthopedics;  Laterality: Left;   SHOULDER OPEN ROTATOR CUFF REPAIR Right 03/30/2014   Procedure: ROTATOR CUFF REPAIR SHOULDER OPEN;  Surgeon: Kerin Salen, MD;  Location: [place];  Service: Orthopedics;  Laterality: Right;   TEE WITHOUT CARDIOVERSION N/A 01/15/2019   Procedure: TRANSESOPHAGEAL ECHOCARDIOGRAM (TEE);  Surgeon: Sanda Klein, MD;  Location: [facility] ENDOSCOPY;  Service: Cardiovascular;  Laterality: N/A;   TONSILLECTOMY  child   TOTAL KNEE ARTHROPLASTY Left 2006    Social History:  reports that he has never smoked. He has never used smokeless tobacco. He reports previous alcohol use. He reports that he does not use drugs.   No Known Allergies  Family History  Problem Relation Age of Onset   Other Father        MVA   Cancer Mother       Prior to Admission medications   Medication Sig Start Date End Date Taking? Authorizing Provider  amiodarone (PACERONE) 200 MG tablet Take 200 mg by mouth daily. 07/05/20   [provider]  amLODipine (NORVASC) 10 MG tablet Take 10 mg by  mouth daily.  04/23/17   [provider]  carvedilol (COREG) 25 MG tablet TAKE 2 TABLETS BY MOUTH TWICE DAILY WITH  A  MEAL. APPOINTMNT REQUIRED FOR FUTURE REFILLS. 07/01/20   [name], Dani Gobble, MD  fluticasone (FLONASE) 50 MCG/ACT nasal spray Place 2 sprays into both nostrils daily as needed for allergies.  02/11/18 04/07/19  [provider]  furosemide (LASIX) 40 MG tablet Take 20 mg by mouth every other day. 07/10/20   [provider]  glipiZIDE (GLUCOTROL) 10 MG tablet Take 10 mg by mouth 2 (two) times daily.    [provider]  HYDROcodone-acetaminophen (NORCO) 10-325 MG tablet Take 1 tablet by mouth every 4 (four) hours as needed for moderate pain ((score 4 to 6)). 07/22/20   [name], Troy C, DO  insulin lispro (HUMALOG) 100 UNIT/ML injection Inject 2-8 Units into the skin as needed for high blood sugar.     [provider]  losartan (COZAAR) 100 MG tablet Take 100 mg by mouth daily.     [provider]  metFORMIN (GLUCOPHAGE) 500 MG tablet Take 500 mg by mouth 2 (two) times daily with a meal. Takes with 500mg  XR    [provider]  metFORMIN (GLUCOPHAGE-XR) 500 MG 24 hr tablet Take 500 mg by mouth 2 (two) times daily. 07/01/20   [provider]  methocarbamol (ROBAXIN) 500 MG tablet Take 1 tablet (500 mg total) by mouth every 6 (six) hours as needed for muscle spasms. 07/22/20   [name], Theodoro Doing, DO  Multiple Vitamin (MULTIVITAMIN WITH MINERALS) TABS tablet Take 1 tablet by mouth daily.     [provider]  pantoprazole (PROTONIX) 40 MG tablet Take 40 mg by mouth 2 (two) times daily. 07/08/20   [provider]  Potassium Chloride ER 20 MEQ TBCR Take 1 tablet by mouth once daily 07/01/20   [name], Mihai, MD    Physical Exam: BP (!) 122/58 (BP Location: Left Arm)   Pulse 63   Temp 97.7 F (36.5 C) (Oral)   Resp 18   Ht 5\' 9"  (1.753 m)   Wt 102.1 kg   SpO2 97%   BMI 33.23 kg/m   General: 80 y.o. year-old male  well developed well nourished in no acute distress.  Alert and oriented x3. Cardiovascular: Regular rate and  rhythm with no rubs or gallops.  No thyromegaly.  Right JVD noted.  2+ pitting edema in lower extremities bilaterally.   Respiratory: Mild rales at bases with no wheezing noted. Good inspiratory effort. Abdomen: Soft nontender nondistended with normal bowel sounds x4 quadrants. Muskuloskeletal: No cyanosis or clubbing.  2+ pitting edema in lower extremities bilaterally. Neuro: CN II-XII intact, strength, sensation, reflexes Skin: No ulcerative lesions noted or rashes Psychiatry: Judgement and insight appear normal. Mood is appropriate for condition and setting          Labs on Admission:  Basic Metabolic Panel: Recent Labs  Lab 08/23/20 1827  NA 136  K 4.2  CL 106  CO2 20*  GLUCOSE 111*  BUN 29*  CREATININE 1.85*  CALCIUM 8.3*   Liver Function Tests: Recent Labs  Lab 08/23/20 1827  AST 19  ALT 19  ALKPHOS 77  BILITOT 0.4  PROT 7.0  ALBUMIN 3.7   No results for input(s): LIPASE, AMYLASE in the last 168 hours. No results for input(s): AMMONIA in the last 168 hours. CBC: Recent Labs  Lab 08/23/20 1827  WBC 4.8  HGB 7.5*  HCT 24.6*  MCV 88.8  PLT 133*   Cardiac Enzymes: No results for input(s): CKTOTAL, CKMB, CKMBINDEX, TROPONINI in the last 168 hours.  BNP (last 3 results) Recent Labs    08/23/20 2223  BNP 246.8*    ProBNP (last 3 results) No results for input(s): PROBNP in the last 8760 hours.  CBG: No results for input(s): GLUCAP in the last 168 hours.  Radiological Exams on Admission: DG Chest 2 View  Result Date: 08/23/2020 CLINICAL DATA:  Shortness of breath. EXAM: CHEST - 2 VIEW COMPARISON:  Chest x-ray 03/10/2020, CT chest 10/15/2009 FINDINGS: Left chest wall cardiac pacemaker with 3 leads in similar position. The heart size and mediastinal contours are unchanged. Aortic calcification. Cardiac surgical changes overlie the mediastinum.  Coronary artery stent. No focal consolidation. No pulmonary edema. Interval development of a trace right pleural effusion. Possible trace left pleural effusion. No pneumothorax. No acute osseous abnormality. IMPRESSION: Interval development of a trace right pleural effusion. Possible trace left pleural effusion. Electronically Signed   By: Iven Finn M.D.   On: 08/23/2020 18:53    EKG: I independently viewed the EKG done and my findings are as followed: Atrial paced rate of 67.  QTc 483.  Assessment/Plan Present on Admission: **None**  Active Problems:   CHF exacerbation (HCC)  Acute on chronic diastolic CHF Presented with progressively worsening dyspnea worse in the last 2 days.  Associated with worsening bilateral lower extremity edema Right DVT present on exam, elevated BNP. Last 2D echo done on 01/15/2019 revealed LVEF 55 to 60%.  The left ventricle had no regional wall motion abnormalities.  The left atrial size was moderately dilated. Start IV Lasix 20 mg twice daily Strict I's and O's and daily weight. Follow 2D echo, ordered.  Paroxysmal A. Fib Resume home amiodarone Not on oral anticoagulation prior to admission. 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 Baseline creatinine 1.8 with GFR of 37. Monitor H&H No overt bleeding at the time of this visit. If no improvement of dyspnea with diuresing consider transfusing 1 unit of PRBCs  Dyspnea with no hypoxia Treat underlying condition, acute CHF and anemia Bronchodilators Mobilize as tolerated  GERD Resume home PPI twice daily.  CKD 3B Appears  to be at his baseline creatinine Monitor urine output Avoid nephrotoxic agents and hypotension.   DVT prophylaxis: Subcu Lovenox daily  Code Status: Full code  Family Communication: None habits  Disposition Plan: Admitted to  telemetry cardiac  Consults called: None, consult cardiology in the morning  Admission status: Inpatient status.  Patient will require at least 2 midnights for further evaluation and treatment of present condition.   Status is: Inpatient    Dispo: The patient is from: Home f              Anticipated d/c is to: Home possibly on 08/25/2020 or when cardiology signs off.               Patient currently not stable for discharge due to ongoing diuresing.     Difficult to place patient, not applicable.        Alan Memos MD [facility] Pager [phone]  If 7PM-7AM, please contact night-coverage [email or web address] Password [email or web address]  08/24/2020, 4:59 AM

## 2020-08-24 NOTE — Plan of Care (Signed)

## 2020-08-24 NOTE — Consult Note (Signed)
Cardiology Consultation:   Patient ID: Alan [name] MRN: [id number]; DOB: 1940-04-26  Admit date: 08/23/2020 Date of Consult: 08/24/2020  PCP:  [name], MD   [facility] Providers Cardiologist:  Sanda Klein, MD        Patient Profile:   Alan [name] is a 80 y.o. male with a hx of CAD s/p CABG (1990, redo 7741), diastolic HF, paroxysmal atrial fibrillation, sick sinus syndrome with atrial standstill and dual-chamber Medtronic permanent pacemaker (tachycardia-bradycardia syndrome, atrially "pacemaker dependent"), recurrent GI bleeding even off anticoagulation, diabetes mellitus type 2, HTN, CKD stage IIIb, OSA on CPAP, and a history of childhood polio who is being seen 08/24/2020 for the evaluation of acute on chronic diastolic heart failure at the request of Dr. Wyline Copas.  History of Present Illness:   Alan [name] was admitted with shortness of breath with minimal activity, marked bilateral lower extremity edema and significant weight gain, NYHA functional class III, at least 10 pounds above "dry weight".  The edema and worsening shortness of breath occurred gradually over a period of at least a week before his hospitalization.  He did not have frank orthopnea or PND.  He has not had angina at rest or with activity and denies dizziness, syncope, new focal neurological complaints, recent falls, active GI bleeding (no hematemesis/melena in the last few weeks), excessive bruising, claudication.  He has received intravenous diuretics and feels much better.  He still has substantial lower extremity edema.  His breathing has improved.  He is not requiring oxygen right now.  Despite a history of atrial fibrillation and high embolic [id number] risk score, Alan [name] is not on chronic anticoagulation due to recurrent anemia and GI bleeding requiring transfusions.  Bleeding has occurred both off anticoagulation and on anticoagulation.  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   We have been monitoring the burden of atrial fibrillation using his pacemaker this has been extremely low until just recently.  Therefore, have felt that the risk of bleeding exceeds the risk of embolic stroke.  Alan [name] has a longstanding history of GI bleeding including small bowel AVMs identified in 2019, duodenal ulcer identified in 2020, hemorrhoids and small adenomas on colonoscopy in 2020.  He has previously had a video capsule endoscopy that did not offer any diagnostic information.  Bleeding has occurred while on oral anticoagulants, but also while not on any anticoagulation whatsoever.    He was hospitalized on 07/05/2020 at [facility] due to roughly 4 days of melena and worsening anemia.  He underwent a CT angiogram of the abdomen and pelvis did not show acute bleeding source.  He underwent a small bowel enteroscopy without active bleeding.  The plan was for an outpatient capsule endoscopy.  He had lower extremity edema at the time and underwent venous Dopplers which did not show DVT, but which documented subcutaneous edema.  Discharge hemoglobin was 8.8 and his baseline is 10-11).  Creatinine was 1.90 on admission and down to 1.44 by the time of discharge.  He underwent an echocardiogram that showed LVEF 50-55% and a plethoric inferior vena cava consistent with elevated right atrial pressure.  His dose of diuretic was not adjusted and kept at 40 mg of furosemide every other day.  On 07/05/2020 his weight was documented at 96.6 kg (213 pounds).  During that hospitalization he was diagnosed as having "paced  rhythm, wide QRS rhythm" 07/05/2020.  The subsequent ECG on 07/06/2020 shows atrial paced rhythm with prolonged AV conduction and right bundle branch block.  The ECGs are not available for review.  He tells me that over the last several weeks he has had numerous labs performed  through his primary care provider's office and that his dose of furosemide was decreased.  This was probably in response to a change in his creatinine which was 1.44 on 07/06/2020 and increased to 2.01 on 07/12/2020.  I do not have the records from his PCPs office, but but she tells me that his dose of furosemide was cut in half (now to be 20 mg every other day).  He has a longstanding history of lumbar spine and sacroiliac joint disease and multiple previous surgical procedures.  He most recently underwent right sacroiliac joint fus.ion for intractable sacroiliitis on 07/20/2020.  Discharge weight was documented at 103 kg (227 pounds).  Most recent hemoglobin prior to that surgery was 8.8 on 07/16/2020, subsequently 7.9 at the time of discharge on 07/21/2020.  Interrogation of his pacemaker continues to show a very low burden of atrial fibrillation with the exception of a 14-hour episode that occurred on 07/21/2020 (that is the day after his back surgery).   Past Medical History:  Diagnosis Date   Anticoagulant long-term use    Arthritis    Arthropathy of left shoulder    Atypical mole    Biceps tendonitis, left    Bursitis of left shoulder    Cardiac pacemaker in situ    first insertion 2008;  genertor and new lead change 03-08-2012;   Medtronic   CHB (complete heart block) ([place])    s/p  PPM 2008   Chronic back pain    CKD (chronic kidney disease), stage III [facility])    Coronary artery disease cardiologist--- dr Andjela Wickes   hx  CABG x2  1990 and re-do 2004;  multiple PCI to [place] to 2004;    cardiac cath 2012  occluded LAD and RCA with patent grafts   Difficult intubation    " Dr. Sallyanne Kuster said that it was difficulty to get the tube in."   History of coronary angioplasty    multiple PCI to RCA ,  1990 to 2004   History of GI bleed followed by GI-- Olevia Perches PA @ [facility] in [place]   01/ 2020  upper and lower GI bleed ;  s/p  EGD with cautery of jejunal bleed and blood  transfusion's   History of kidney stones    Hx of echocardiogram 06/29/2008   EF 45-50%    Hypertension    Ischemic cardiomyopathy    previously reported , ef 40-45%;  2010--- ef 45-50%;   last echo 2017 55-60%   Mixed hyperlipidemia    Nocturia    OSA on CPAP    cpap, 12, last sleep study Jan2013, sees Dr. Keturah Barre   PAF (paroxysmal atrial fibrillation) ([place])    long hx PAF--- followed by dr Mykalah Saari   Post-polio syndrome    dx polio age 68;   09-30-2018  per pt occasional gait issues and occasion fall   Presence of permanent cardiac pacemaker    Pseudoarthrosis of lumbar spine    Rotator cuff tear, left    S/P CABG x 2    1990 x2  and 2004  x2   Sinus node dysfunction ([place])    2015--- sinus node arrest  with intact AV conduction  Squamous cell carcinoma of skin    Type 2 diabetes mellitus ([place])    followed by pcp    Past Surgical History:  Procedure Laterality Date   APPENDECTOMY  child   APPLICATION OF INTRAOPERATIVE CT SCAN N/A 07/20/2020   Procedure: APPLICATION OF INTRAOPERATIVE CT SCAN;  Surgeon: [name], Theodoro Doing, DO;  Location: [place];  Service: Neurosurgery;  Laterality: N/A;   BLEPHAROPLASTY Bilateral 12/2014   upper eyelid   CARDIAC PACEMAKER PLACEMENT  2008   CARDIOVERSION N/A 01/15/2019   Procedure: CARDIOVERSION;  Surgeon: Sanda Klein, MD;  Location: [place] ENDOSCOPY;  Service: Cardiovascular;  Laterality: N/A;   CARPAL TUNNEL RELEASE Bilateral 2015;  2016   CATARACT EXTRACTION W/ INTRAOCULAR LENS  IMPLANT, BILATERAL  2017   CORONARY ANGIOPLASTY  1990  to 2004   multiple to [place]    @ [place] (dr chitwood)   seq. LIMA to LAD and Diagonal   CORONARY ARTERY BYPASS GRAFT  2004     dr Amador Cunas   SVG to  RCA and PDA;  ALSO MAZE  PROCEDURE   ESOPHAGOGASTRODUODENOSCOPY Left 03/18/2013   Procedure: ESOPHAGOGASTRODUODENOSCOPY (EGD);  Surgeon: Cleotis Nipper, MD;  Location: [facility] ENDOSCOPY;  Service: Endoscopy;  Laterality: Left;    HARDWARE REMOVAL N/A 05/18/2016   Procedure: Removal of broken hardware Lumbar three-four;  Surgeon: [name], MD;  Location: [place];  Service: Neurosurgery;  Laterality: N/A;   HERNIA REPAIR     LEFT HEART CATH AND CORS/GRAFTS ANGIOGRAPHY N/A 12/18/2018   Procedure: LEFT HEART CATH AND CORS/GRAFTS ANGIOGRAPHY;  Surgeon: Wellington Hampshire, MD;  Location: [place] CV LAB;  Service: Cardiovascular;  Laterality: N/A;   LEFT HEART CATHETERIZATION WITH CORONARY/GRAFT ANGIOGRAM N/A 12/21/2010   Procedure: LEFT HEART CATHETERIZATION WITH Beatrix Fetters;  Surgeon: Leonie Man, MD;  Location: [facility] CATH LAB;  Service: Cardiovascular;  Laterality: N/A;   LEG SURGERY Right 2002  approx.   lengthened his leg   LUMBAR LAMINECTOMY/DECOMPRESSION MICRODISCECTOMY  01/10/2012   Procedure: LUMBAR LAMINECTOMY/DECOMPRESSION MICRODISCECTOMY 1 LEVEL;  Surgeon: [name], MD;  Location: [place] NEURO ORS;  Service: Neurosurgery;  Laterality: Bilateral;  Lumbar three-four decompression, Posterior lateral fusion lumbar three-four, posterior spinus plate lumbar three-four   LUMBAR LAMINECTOMY/DECOMPRESSION MICRODISCECTOMY Left 05/18/2016   Procedure: Laminectomy and Foraminotomy - Lumbar two-lumbar three- Lumbar four-lumbar five- left with removal hardware lumbar three-four;  Surgeon: [name], MD;  Location: [place];  Service: Neurosurgery;  Laterality: Left;   NASAL SEPTUM SURGERY  yrs ago   PACEMAKER REVISION N/A 03/07/2012   Procedure: PACEMAKER REVISION;  Surgeon: Sanda Klein, MD;  Location: [place] CATH LAB;  Service: Cardiovascular;  Laterality: N/A;   POSTERIOR LUMBAR FUSION  02-22-2017   dr Ronnald Ramp  @ [facility]   re-do laminectomy L2-3 and fusion   POSTERIOR LUMBAR FUSION  03-11-2018   dr Ronnald Ramp @ [facility]   laminectomy L4-5;  fixation L2-S1 and fusion L3-S1   SACROILIAC JOINT FUSION N/A 07/20/2020   Procedure: Sacroiliac Joint Fusion;  Surgeon: Karsten Ro, DO;  Location: [place];  Service: Neurosurgery;   Laterality: N/A;   SHOULDER ARTHROSCOPY Right after 2016, pt unsure year   SHOULDER ARTHROSCOPY WITH OPEN ROTATOR CUFF REPAIR Right 03/30/2014   Procedure: RIGHT SHOULDER ARTHROSCOPY  OPEN ROTATOR CUFF REPAIR;  Surgeon: Kerin Salen, MD;  Location: [place];  Service: Orthopedics;  Laterality: Right;   SHOULDER ARTHROSCOPY WITH SUBACROMIAL DECOMPRESSION, ROTATOR CUFF REPAIR AND BICEP  TENDON REPAIR Left 10/01/2018   Procedure: LEFT SHOULDER ARTHROSCOPY, DISTAL CLAVICLE EXCISION,SUBACROMIAL, DECOMPRESSION, RORATOR CUFF REPAIR, BICEPS TENODESIS;  Surgeon: Renette Butters, MD;  Location: [place];  Service: Orthopedics;  Laterality: Left;   SHOULDER OPEN ROTATOR CUFF REPAIR Right 03/30/2014   Procedure: ROTATOR CUFF REPAIR SHOULDER OPEN;  Surgeon: Kerin Salen, MD;  Location: [place];  Service: Orthopedics;  Laterality: Right;   TEE WITHOUT CARDIOVERSION N/A 01/15/2019   Procedure: TRANSESOPHAGEAL ECHOCARDIOGRAM (TEE);  Surgeon: Sanda Klein, MD;  Location: [place] ENDOSCOPY;  Service: Cardiovascular;  Laterality: N/A;   TONSILLECTOMY  child   TOTAL KNEE ARTHROPLASTY Left 2006       Inpatient Medications: Scheduled Meds:  amiodarone  200 mg Oral Daily   enoxaparin (LOVENOX) injection  40 mg Subcutaneous Q24H   furosemide  20 mg Intravenous BID   ipratropium-albuterol  3 mL Nebulization Q6H   multivitamin with minerals  1 tablet Oral Daily   pantoprazole  40 mg Oral BID   Continuous Infusions:  PRN Meds:   Allergies:   No Known Allergies  Social History:   Social History   Socioeconomic History   Marital status: Single    Spouse name: Not on file   Number of children: 1   Years of education: 16   Highest education level: Not on file  Occupational History   Occupation: pilot-retired  Tobacco Use   Smoking status: Never   Smokeless tobacco: Never  Vaping Use   Vaping Use: Never used  Substance and Sexual Activity   Alcohol use: Not Currently   Drug use: Never    Sexual activity: Yes  Other Topics Concern   Not on file  Social History Narrative   Lives on [address] [employer], fishes.    Right-handed.   1 cup caffeine per day.   Social Determinants of Health   Financial Resource Strain: Not on file  Food Insecurity: Not on file  Transportation Needs: Not on file  Physical Activity: Not on file  Stress: Not on file  Social Connections: Not on file  Intimate Partner Violence: Not on file    Family History:    Family History  Problem Relation Age of Onset   Other Father        MVA   Cancer Mother      ROS:  Please see the history of present illness.  All other ROS reviewed and negative.     Physical Exam/Data:   Vitals:   08/24/20 0409 08/24/20 0718 08/24/20 0812 08/24/20 1128  BP: (!) 122/58 (!) 149/65  138/65  Pulse: 63 63  61  Resp: 18 20  18   Temp: 97.7 F (36.5 C) 98.4 F (36.9 C)  98.4 F (36.9 C)  TempSrc: Oral Oral  Oral  SpO2: 97% 99% 99% 98%  Weight:      Height:        Intake/Output Summary (Last 24 hours) at 08/24/2020 1405 Last data filed at 08/24/2020 1257 Gross per 24 hour  Intake 960 ml  Output 3600 ml  Net -2640 ml   Last 3 Weights 08/23/2020 07/20/2020 07/16/2020  Weight (lbs) 225 lb 227 lb 1.6 oz 227 lb 1.6 oz  Weight (kg) 102.059 kg 103.012 kg 103.012 kg     Body mass index is 33.23 kg/m.  General:  Well nourished, well developed, in no acute distress HEENT: normal Lymph: no adenopathy Neck: 8-10 cm JVD Endocrine:  No thryomegaly Vascular: No carotid bruits; FA pulses  2+ bilaterally without bruits  Cardiac:  normal S1, widely split S2; RRR; no murmur.  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  QTc 483 ms.  Unchanged from previous tracings. Telemetry:  Telemetry was personally reviewed and demonstrates: Atrial paced, ventricular sensed rhythm  Relevant CV Studies: Comprehensive pacemaker interrogation by me today Normal dual-chamber permanent pacemaker function. All lead parameters in normal range. 97% of the time the rhythm is atrial paced, ventricular sensed.  He does not have any intrinsic atrial activity ("atrially pacemaker dependent"). Burden of atrial fibrillation is 2.2% over the last couple of months, almost entirely made up by 1 episode of 14 hours of paroxysmal atrial fibrillation that occurred on 07/21/2020 (the day after his back surgery).  Ventricular rate control was good. There have been no episodes of high ventricular rate/ventricular tachycardia. Note significant increase in the burden of ventricular pacing during atrial fibrillation. Has a few episodes of far field R wave oversensing. The atrial sensitivity was adjusted from 0.18 mV to 0.5 mV and PVAB was increased to 300 ms to avoid FFRW over sensing.   Ech[facility]ogram 08/24/2020 at [facility]    1. Left ventricular ejection fraction, by estimation, is 50 to 55%. The  left ventricle has low normal function. The left ventricle has no regional  wall motion abnormalities. The left ventricular internal cavity size was  mildly dilated. There is mild  concentric left ventricular hypertrophy. Indeterminate diastolic filling  due to E-A fusion. There is the interventricular septum is flattened in  diastole ('D' shaped left ventricle), consistent with right ventricular  volume overload.   2. Right ventricular systolic function is moderately reduced.  The right  ventricular size is severely enlarged. There is mildly elevated pulmonary  artery systolic pressure.   3. Left atrial size was mild to moderately dilated.   4. Right atrial size was severely dilated.   5. The mitral valve is normal in structure. Mild to moderate mitral valve  regurgitation.   6. Tricuspid valve regurgitation is moderate.   7. The aortic valve is tricuspid. Aortic valve regurgitation is not  visualized.   8. The inferior vena cava is dilated in size with >50% respiratory  variability, suggesting right atrial pressure of 8 mmHg.   Comparison(s): Prior images reviewed side by side. The left ventricular  function is unchanged. The right ventricular hypertrophy is unchanged.  (the right ventricle was dilated and had moderately reduced function on  the previous study as well. The right  ventricular systolic function is worse. Tricuspid insufficency is worse  and there is evidence of increased right atrial pressure.   Echocardiogram 07/06/2020 [facility] Left Ventricle: There is mild apical hypokinesis of the left ventricle.    Left Ventricle: EF: 50-55%.    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 and  collapses <50%; therefore, the right atrial pressure is estimated at 15  mmHg.    Laboratory Data:  High Sensitivity Troponin:  Recent Labs  Lab 08/23/20 1827 08/23/20 2205  TROPONINIHS 8 8     Chemistry Recent Labs  Lab 08/23/20 1827 08/24/20 0619  NA 136  --   K 4.2  --   CL 106  --   CO2 20*  --   GLUCOSE 111*  --   BUN 29*  --   CREATININE 1.85* 1.94*  CALCIUM 8.3*  --   GFRNONAA 37* 35*  ANIONGAP 10  --     Recent Labs  Lab 08/23/20 1827  PROT 7.0  ALBUMIN 3.7  AST 19  ALT 19  ALKPHOS 77  BILITOT 0.4   Hematology Recent Labs  Lab 08/23/20 1827 08/24/20 0619  WBC 4.8 5.6  RBC 2.77* 2.83*  HGB 7.5* 7.8*  HCT 24.6* 24.9*  MCV 88.8 88.0  MCH 27.1 27.6  MCHC 30.5 31.3  RDW 15.1 14.8  PLT  133* 147*   BNP Recent Labs  Lab 08/23/20 2223  BNP 246.8*    DDimer No results for input(s): DDIMER in the last 168 hours.   Radiology/Studies:  DG Chest 2 View  Result Date: 08/23/2020 CLINICAL DATA:  Shortness of breath. EXAM: CHEST - 2 VIEW COMPARISON:  Chest x-ray 03/10/2020, CT chest 10/15/2009 FINDINGS: Left chest wall cardiac pacemaker with 3 leads in similar position. The heart size and mediastinal contours are unchanged. Aortic calcification. Cardiac surgical changes overlie the mediastinum. Coronary artery stent. No focal consolidation. No pulmonary edema. Interval development of a trace right pleural effusion. Possible trace left pleural effusion. No pneumothorax. No acute osseous abnormality. IMPRESSION: Interval development of a trace right pleural effusion. Possible trace left pleural effusion. Electronically Signed   By: Iven Finn M.D.   On: 08/23/2020 18:53   ECHOCARDIOGRAM COMPLETE  Result Date: 08/24/2020    ECHOCARDIOGRAM REPORT   Patient Name:   TEVIS DUNAVAN Date of Exam:    08/24/2020 Medical Rec #:  [id number]     [place]099Height: 69.0 in Accession #:    [id number]    Weight:          225.0 lb Date of Birth:  1940/07/03    BSA:             2.172 m Patient Age:    44 years      BP:              149/65 mmHg Patient Gender: M             HR:              63 bpm. Exam Location:  Inpatient Procedure: 2D Echo, Cardiac Doppler and Color Doppler Indications:    CHF-Acute Diastolic  History:        Patient has prior history of Echocardiogram examinations, most                 recent 01/15/2019. Prior CABG and Pacemaker, Arrythmias:Atrial                 Fibrillation, Signs/Symptoms:Shortness of Breath; Risk                 Factors:Hypertension, Diabetes, Dyslipidemia and Sleep Apnea.                 CKD.  Sonographer:    Clayton Lefort RDCS (AE) Referring Phys: [id number] [place]  1. Left ventricular ejection fraction, by estimation, is 50 to 55%. The left ventricle  has low normal function. The left ventricle has no  regional wall motion abnormalities. The left ventricular internal cavity size was mildly dilated. There is mild concentric left ventricular hypertrophy. Indeterminate diastolic filling due to E-A fusion. There is the interventricular septum is flattened in diastole ('D' shaped left ventricle), consistent with right ventricular volume overload.  2. Right ventricular systolic function is moderately reduced. The right ventricular size is severely enlarged. There is mildly elevated pulmonary artery systolic pressure.  3. Left atrial size was mild to moderately dilated.  4. Right atrial size was severely dilated.  5. The mitral valve is normal in structure. Mild to moderate mitral valve regurgitation.  6. Tricuspid valve regurgitation is moderate.  7. The aortic valve is tricuspid. Aortic valve regurgitation is not visualized.  8. The inferior vena cava is dilated in size with >50% respiratory variability, suggesting right atrial pressure of 8 mmHg. Comparison(s): Prior images reviewed side by side. The left ventricular function is unchanged. The right ventricular hypertrophy is unchanged. (the right ventricle was dilated and had moderately reduced function on the previous study as well. The right ventricular systolic function is worse. Tricuspid insufficency is worse and there is evidence of increased right atrial pressure. FINDINGS  Left Ventricle: Left ventricular ejection fraction, by estimation, is 50 to 55%. The left ventricle has low normal function. The left ventricle has no regional wall motion abnormalities. The left ventricular internal cavity size was mildly dilated. There is mild concentric left ventricular hypertrophy. Abnormal (paradoxical) septal motion, consistent with RV pacemaker and the interventricular septum is flattened in diastole ('D' shaped left ventricle), consistent with right ventricular volume overload. Left ventricular diastolic function  could not be evaluated due to atrial fibrillation. Indeterminate diastolic filling due to E-A fusion. Right Ventricle: The right ventricular size is severely enlarged. No increase in right ventricular wall thickness. Right ventricular systolic function is moderately reduced. There is mildly elevated pulmonary artery systolic pressure. The tricuspid regurgitant velocity is 2.50 m/s, and with an assumed right atrial pressure of 8 mmHg, the estimated right ventricular systolic pressure is 31.5 mmHg. Left Atrium: Left atrial size was mild to moderately dilated. Right Atrium: Right atrial size was severely dilated. Pericardium: There is no evidence of pericardial effusion. Mitral Valve: The mitral valve is normal in structure. Mild mitral annular calcification. Mild to moderate mitral valve regurgitation, with centrally-directed jet. MV peak gradient, 5.2 mmHg. The mean mitral valve gradient is 1.0 mmHg. Tricuspid Valve: The tricuspid valve is normal in structure. Tricuspid valve regurgitation is moderate. Aortic Valve: The aortic valve is tricuspid. Aortic valve regurgitation is not visualized. Aortic valve mean gradient measures 3.0 mmHg. Aortic valve peak gradient measures 5.7 mmHg. Aortic valve area, by VTI measures 3.25 cm. Pulmonic Valve: The pulmonic valve was normal in structure. Pulmonic valve regurgitation is trivial. Aorta: The aortic root and ascending aorta are structurally normal, with no evidence of dilitation. Venous: The inferior vena cava is dilated in size with greater than 50% respiratory variability, suggesting right atrial pressure of 8 mmHg. IAS/Shunts: No atrial level shunt detected by color flow Doppler. Additional Comments: A device lead is visualized in the right ventricle.  LEFT VENTRICLE PLAX 2D LVIDd:         5.90 cm LVIDs:         3.90 cm LV PW:         1.50 cm LV IVS:        1.40 cm LVOT diam:     2.20 cm LV SV:         79 LV  SV Index:   36 LVOT Area:     3.80 cm  RIGHT VENTRICLE             IVC RV Basal diam:  4.30 cm    IVC diam: 2.70 cm RV Mid diam:    4.00 cm RV S prime:     6.73 cm/s TAPSE (M-mode): 0.8 cm LEFT ATRIUM             Index       RIGHT ATRIUM           Index LA diam:        4.10 cm 1.89 cm/m  RA Area:     34.30 cm LA Vol (A2C):   85.9 ml 39.55 ml/m RA Volume:   136.00 ml 62.61 ml/m LA Vol (A4C):   63.8 ml 29.37 ml/m LA Biplane Vol: 75.6 ml 34.81 ml/m  AORTIC VALVE AV Area (Vmax):    3.19 cm AV Area (Vmean):   3.15 cm AV Area (VTI):     3.25 cm AV Vmax:           119.00 cm/s AV Vmean:          78.700 cm/s AV VTI:            0.243 m AV Peak Grad:      5.7 mmHg AV Mean Grad:      3.0 mmHg LVOT Vmax:         100.00 cm/s LVOT Vmean:        65.200 cm/s LVOT VTI:          0.208 m LVOT/AV VTI ratio: 0.86  AORTA Ao Root diam: 3.70 cm Ao Asc diam:  3.50 cm MITRAL VALVE             TRICUSPID VALVE MV Area (PHT): 2.24 cm  TR Peak grad:   25.0 mmHg MV Area VTI:   2.79 cm  TR Vmax:        250.00 cm/s MV Peak grad:  5.2 mmHg MV Mean grad:  1.0 mmHg  SHUNTS MV Vmax:       1.14 m/s  Systemic VTI:  0.21 m MV Vmean:      54.9 cm/s Systemic Diam: 2.20 cm Terasa Orsini MD Electronically signed by Sanda Klein MD Signature Date/Time: 08/24/2020/11:00:05 AM    Final           Assessment and Plan:   CHF: Decompensation appears to be due to a "perfect storm" of anemia, decreased dose of diuretics in response to perceived worsening renal function and fluid overload at the time of surgery.  He has borderline decreased LVEF, unchanged from previous evaluations.  Clearly volume overloaded.  At his last office appointment he weighed 216 pounds.  Hypervolemia is clearly documented on the echocardiogram and the lower extremity duplex ultrasound performed on 07/06/2020, when reportedly he only weighed 213 pounds.  He is well above that weight now.  Would recommend diuresing down to a weight of 213-216 pounds.  BNP is elevated at 247, but we do not have a previous baseline. 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  Monitor closely while diuresing. Parox AFib: Over the last few years the bleeding risk has exceeded the embolic risk.  He has had recurrent need for blood transfusions even when he was off anticoagulation.  He just had another episode of GI bleed of uncertain cause about  6 weeks ago, while off oral anticoagulants.  I continue to believe that the risk of anticoagulation exceeds the benefit, but will talk to [facility] about may be undergoing a Watchman device implantation.  Would still have to wait for a few months of "no GI bleeding time", since that procedure does require a period of anticoagulation afterwards. Amiodarone: No evidence of toxicity to date.  Normal liver function test on this admission.  Needs an updated TSH and will check on this admission. Iron deficiency anemia/recurrent GI bleeding.  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  It appears doubtful that we will be able to stop future episodes of GI bleeding, which makes him a very poor candidate for long-term anticoagulation. CAD: Asymptomatic despite significant anemia.  There is been some concern about possible apical hypokinesis on his echocardiogram, but it is possible that was entirely due to RV apical pacing related dyssynchrony.  He has not had angina or required revascularization in a while.  He did have some angina during periods of prolonged atrial fibrillation with an increased ventricular pacing.  Once the arrhythmia resolved, angina also resolved.  Coronary angiography showed stable anatomy of native and bypass  vessels.  All the major myocardial territories have good arterial supply via native vessels or grafts, with the exception of a possible small area of ischemic myocardium in the posterior wall.He is best suited for medical therapy due to his problems with recurrent GI bleeding. OSA: He reports 100% compliance with CPAP and denies daytime hypersomnolence.  This is important to reduce the burden of atrial fibrillation. DM: Excellent control, most recent hemoglobin A1c 6.7% HTN: Fair control on current medications HLP: Most recent LDL cholesterol was not too bad at 81 on 05/26/2020, but he has a chronically low HDL around 40.  Target LDL less than 70.  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  Very resistant to suggestions to take expensive, brand-name medications.  It is possible we might be able to get him Leqvio at low out-of-pocket cost.  We will research this. Pacemaker: Normal device function.  Some problems with atrial far field R wave oversensing for which adjustments were made to the device today.  Continue remote downloads every 3 months and office visits at least yearly.   Risk Assessment/Risk Scores:        New York Heart Association (NYHA) Functional Class NYHA Class III  CHA2DS2-VASc Score = 6  This indicates a 9.7% annual risk of stroke. The patient's score is based upon: CHF History: Yes HTN History: Yes Diabetes History: Yes Stroke History: No Vascular Disease History: Yes Age Score: 2 Gender Score: 0        For questions or updates, please contact [place] Please consult [email or web address] for contact info under    Signed, Sanda Klein, MD  08/24/2020 2:05 PM

## 2020-08-24 NOTE — Progress Notes (Signed)
PROGRESS NOTE    Alan [name]  QIO:[id number] DOB: 1940-04-22 DOA: 08/23/2020 PCP: [name], MD    Brief Narrative:  80 y.o. male with medical history significant for childhood polio, right SI fusion (07/20/20), chronic anemia, paroxysmal A. fib on Eliquis, hypertension, type 2 diabetes, GERD, who presented initially to [place] ED at the recommendation of his pulmonologist due to progressively worsening nonexertional dyspnea.  Associated with lower extremity edema, elevated JVD, elevated BNP with bnp of 246.  Patient was subsequently transferred to [facility] for further evaluation and management for presumed exacerbation of CHF  Assessment & Plan:   Active Problems:   CHF exacerbation (HCC)  Acute on chronic diastolic CHF Presented with progressively worsening dyspnea worse in the last 2 days prior to admit.  Associated with worsening bilateral lower extremity edema Elevated DVT present on exam at presentation with elevated BNP. Last 2D echo done on 01/15/2019 revealed LVEF 55 to 60%.  The left ventricle had no regional wall motion abnormalities.  The left atrial size was moderately dilated. Pt was started on IV Lasix 20 mg twice daily Very good urine output with 2150cc out overnight and 1450cc out thus far today Repeat 2d echo reviewed with findings suggestive of RV volume overload, normal LVEF Cont lasix, repeat bmet in AM Cont home coreg, losartan   Paroxysmal A. Fib Continued home amiodarone Chart reviewed Pt has hx of GI bleeding in the past, previously on coumadin, unable to afford DOAC Currently not on oral anticoagulation Currently continues with pacemaker.  Monitor on telemetry Appreciate input by Dr. 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 Baseline creatinine 1.8 with GFR of 37. -No evidence of acute blood loss at this  time   Dyspnea with no hypoxia -Likely secondary to presenting decompensated diastolic failure Cont diuretics as tolerated  DM -Hold oral hyperglycemic meds while in hospital -cont on SSI coverage   GERD Resume home PPI twice daily.   CKD 3B Appears to be at his baseline creatinine Monitor urine output Avoid nephrotoxic agents and hypotension.  HTN -BP stable at present -Currently on IV lasix -Have reordered home norvasc, coreg, losartan    DVT prophylaxis: Lovenox subq Code Status: Full Family Communication: Pt in room, family not at bedside  Status is: Inpatient  Remains inpatient appropriate because:Inpatient level of care appropriate due to severity of illness  Dispo: The patient is from: Home              Anticipated d/c is to: Home              Patient currently is not medically stable to d/c.   Difficult to place patient No       Consultants:  Cardiology  Procedures:    Antimicrobials: Anti-infectives (From admission, onward)    None       Subjective: Reports feeling better today. Legs are still swollen, albeit improved  Objective: Vitals:   08/24/20 0409 08/24/20 0718 08/24/20 0812 08/24/20 1128  BP: (!) 122/58 (!) 149/65  138/65  Pulse: 63 63  61  Resp: 18 20  18   Temp: 97.7 F (36.5 C) 98.4 F (36.9 C)  98.4 F (36.9 C)  TempSrc: Oral Oral  Oral  SpO2: 97% 99% 99% 98%  Weight:      Height:        Intake/Output Summary (Last 24 hours) at  08/24/2020 1356 Last data filed at 08/24/2020 1257 Gross per 24 hour  Intake 960 ml  Output 3600 ml  Net -2640 ml   Filed Weights   08/23/20 1823  Weight: 102.1 kg    Examination: General exam: Awake, laying in bed, in nad Respiratory system: Normal respiratory effort, no wheezing Cardiovascular system: regular rate, s1, s2 Gastrointestinal system: Soft, nondistended, positive BS Central nervous system: CN2-12 grossly intact, strength intact Extremities: Perfused, no clubbing, BLE with  pitting edema Skin: Normal skin turgor, no notable skin lesions seen Psychiatry: Mood normal // no visual hallucinations   Data Reviewed: I have personally reviewed following labs and imaging studies  CBC: Recent Labs  Lab 08/23/20 1827 08/24/20 0619  WBC 4.8 5.6  HGB 7.5* 7.8*  HCT 24.6* 24.9*  MCV 88.8 88.0  PLT 133* 474*   Basic Metabolic Panel: Recent Labs  Lab 08/23/20 1827 08/24/20 0619  NA 136  --   K 4.2  --   CL 106  --   CO2 20*  --   GLUCOSE 111*  --   BUN 29*  --   CREATININE 1.85* 1.94*  CALCIUM 8.3*  --   MG  --  2.2  PHOS  --  3.4   GFR: Estimated Creatinine Clearance: 37 mL/min (A) (by C-G formula based on SCr of 1.94 mg/dL (H)). Liver Function Tests: Recent Labs  Lab 08/23/20 1827  AST 19  ALT 19  ALKPHOS 77  BILITOT 0.4  PROT 7.0  ALBUMIN 3.7   No results for input(s): LIPASE, AMYLASE in the last 168 hours. No results for input(s): AMMONIA in the last 168 hours. Coagulation Profile: No results for input(s): INR, PROTIME in the last 168 hours. Cardiac Enzymes: No results for input(s): CKTOTAL, CKMB, CKMBINDEX, TROPONINI in the last 168 hours. BNP (last 3 results) No results for input(s): PROBNP in the last 8760 hours. HbA1C: No results for input(s): HGBA1C in the last 72 hours. CBG: No results for input(s): GLUCAP in the last 168 hours. Lipid Profile: No results for input(s): CHOL, HDL, LDLCALC, TRIG, CHOLHDL, LDLDIRECT in the last 72 hours. Thyroid Function Tests: No results for input(s): TSH, T4TOTAL, FREET4, T3FREE, THYROIDAB in the last 72 hours. Anemia Panel: No results for input(s): VITAMINB12, FOLATE, FERRITIN, TIBC, IRON, RETICCTPCT in the last 72 hours. Sepsis Labs: No results for input(s): PROCALCITON, LATICACIDVEN in the last 168 hours.  Recent Results (from the past 240 hour(s))  Resp Panel by RT-PCR (Flu A&B, Covid) Nasopharyngeal Swab     Status: None   Collection Time: 08/23/20 10:40 PM   Specimen: Nasopharyngeal  Swab; Nasopharyngeal(NP) swabs in vial transport medium  Result Value Ref Range Status   SARS Coronavirus 2 by RT PCR NEGATIVE NEGATIVE Final    Comment: (NOTE) SARS-CoV-2 target nucleic acids are NOT DETECTED.  The SARS-CoV-2 RNA is generally detectable in upper respiratory specimens during the acute phase of infection. The lowest concentration of SARS-CoV-2 viral copies this assay can detect is 138 copies/mL. A negative result does not preclude SARS-Cov-2 infection and should not be used as the sole basis for treatment or other patient management decisions. A negative result may occur with  improper specimen collection/handling, submission of specimen other than nasopharyngeal swab, presence of viral mutation(s) within the areas targeted by this assay, and inadequate number of viral copies(<138 copies/mL). A negative result must be combined with clinical observations, patient history, and epidemiological information. The expected result is Negative.  Fact Sheet for Patients:  EntrepreneurPulse.com.au  Fact Sheet for Healthcare Providers:  IncredibleEmployment.be  This test is no t yet approved or cleared by the [place] FDA and  has been authorized for detection and/or diagnosis of SARS-CoV-2 by FDA under an Emergency Use Authorization (EUA). This EUA will remain  in effect (meaning this test can be used) for the duration of the COVID-19 declaration under Section 564(b)(1) of the Act, 21 U.S.C.section 360bbb-3(b)(1), unless the authorization is terminated  or revoked sooner.       Influenza A by PCR NEGATIVE NEGATIVE Final   Influenza B by PCR NEGATIVE NEGATIVE Final    Comment: (NOTE) The Xpert Xpress SARS-CoV-2/FLU/RSV plus assay is intended as an aid in the diagnosis of influenza from Nasopharyngeal swab specimens and should not be used as a sole basis for treatment. Nasal washings and aspirates are unacceptable for Xpert Xpress  SARS-CoV-2/FLU/RSV testing.  Fact Sheet for Patients: EntrepreneurPulse.com.au  Fact Sheet for Healthcare Providers: IncredibleEmployment.be  This test is not yet approved or cleared by the [place] FDA and has been authorized for detection and/or diagnosis of SARS-CoV-2 by FDA under an Emergency Use Authorization (EUA). This EUA will remain in effect (meaning this test can be used) for the duration of the COVID-19 declaration under Section 564(b)(1) of the Act, 21 U.S.C. section 360bbb-3(b)(1), unless the authorization is terminated or revoked.  Performed at [facility], [address] [place], [place] [address]      Radiology Studies: DG Chest 2 View  Result Date: 08/23/2020 CLINICAL DATA:  Shortness of breath. EXAM: CHEST - 2 VIEW COMPARISON:  Chest x-ray 03/10/2020, CT chest 10/15/2009 FINDINGS: Left chest wall cardiac pacemaker with 3 leads in similar position. The heart size and mediastinal contours are unchanged. Aortic calcification. Cardiac surgical changes overlie the mediastinum. Coronary artery stent. No focal consolidation. No pulmonary edema. Interval development of a trace right pleural effusion. Possible trace left pleural effusion. No pneumothorax. No acute osseous abnormality. IMPRESSION: Interval development of a trace right pleural effusion. Possible trace left pleural effusion. Electronically Signed   By: Iven Finn M.D.   On: 08/23/2020 18:53   ECHOCARDIOGRAM COMPLETE  Result Date: 08/24/2020    ECHOCARDIOGRAM REPORT   Patient Name:   Alan [name] Date of Exam:    08/24/2020 Medical Rec #:  [id number]     [place]099Height: 69.0 in Accession #:    [id number]    Weight:          225.0 lb Date of Birth:  02-24-1940    BSA:             2.172 m Patient Age:    89 years      BP:              149/65 mmHg Patient Gender: M             HR:              63 bpm. Exam Location:  Inpatient Procedure: 2D Echo,  Cardiac Doppler and Color Doppler Indications:    CHF-Acute Diastolic  History:        Patient has prior history of Echocardiogram examinations, most                 recent 01/15/2019. Prior CABG and Pacemaker, Arrythmias:Atrial                 Fibrillation, Signs/Symptoms:Shortness of Breath; Risk  Factors:Hypertension, Diabetes, Dyslipidemia and Sleep Apnea.                 CKD.  Sonographer:    Clayton Lefort RDCS (AE) Referring Phys: [id number] [place]  1. Left ventricular ejection fraction, by estimation, is 50 to 55%. The left ventricle has low normal function. The left ventricle has no regional wall motion abnormalities. The left ventricular internal cavity size was mildly dilated. There is mild concentric left ventricular hypertrophy. Indeterminate diastolic filling due to E-A fusion. There is the interventricular septum is flattened in diastole ('D' shaped left ventricle), consistent with right ventricular volume overload.  2. Right ventricular systolic function is moderately reduced. The right ventricular size is severely enlarged. There is mildly elevated pulmonary artery systolic pressure.  3. Left atrial size was mild to moderately dilated.  4. Right atrial size was severely dilated.  5. The mitral valve is normal in structure. Mild to moderate mitral valve regurgitation.  6. Tricuspid valve regurgitation is moderate.  7. The aortic valve is tricuspid. Aortic valve regurgitation is not visualized.  8. The inferior vena cava is dilated in size with >50% respiratory variability, suggesting right atrial pressure of 8 mmHg. Comparison(s): Prior images reviewed side by side. The left ventricular function is unchanged. The right ventricular hypertrophy is unchanged. (the right ventricle was dilated and had moderately reduced function on the previous study as well. The right ventricular systolic function is worse. Tricuspid insufficency is worse and there is evidence of increased right  atrial pressure. FINDINGS  Left Ventricle: Left ventricular ejection fraction, by estimation, is 50 to 55%. The left ventricle has low normal function. The left ventricle has no regional wall motion abnormalities. The left ventricular internal cavity size was mildly dilated. There is mild concentric left ventricular hypertrophy. Abnormal (paradoxical) septal motion, consistent with RV pacemaker and the interventricular septum is flattened in diastole ('D' shaped left ventricle), consistent with right ventricular volume overload. Left ventricular diastolic function could not be evaluated due to atrial fibrillation. Indeterminate diastolic filling due to E-A fusion. Right Ventricle: The right ventricular size is severely enlarged. No increase in right ventricular wall thickness. Right ventricular systolic function is moderately reduced. There is mildly elevated pulmonary artery systolic pressure. The tricuspid regurgitant velocity is 2.50 m/s, and with an assumed right atrial pressure of 8 mmHg, the estimated right ventricular systolic pressure is 03.5 mmHg. Left Atrium: Left atrial size was mild to moderately dilated. Right Atrium: Right atrial size was severely dilated. Pericardium: There is no evidence of pericardial effusion. Mitral Valve: The mitral valve is normal in structure. Mild mitral annular calcification. Mild to moderate mitral valve regurgitation, with centrally-directed jet. MV peak gradient, 5.2 mmHg. The mean mitral valve gradient is 1.0 mmHg. Tricuspid Valve: The tricuspid valve is normal in structure. Tricuspid valve regurgitation is moderate. Aortic Valve: The aortic valve is tricuspid. Aortic valve regurgitation is not visualized. Aortic valve mean gradient measures 3.0 mmHg. Aortic valve peak gradient measures 5.7 mmHg. Aortic valve area, by VTI measures 3.25 cm. Pulmonic Valve: The pulmonic valve was normal in structure. Pulmonic valve regurgitation is trivial. Aorta: The aortic root and  ascending aorta are structurally normal, with no evidence of dilitation. Venous: The inferior vena cava is dilated in size with greater than 50% respiratory variability, suggesting right atrial pressure of 8 mmHg. IAS/Shunts: No atrial level shunt detected by color flow Doppler. Additional Comments: A device lead is visualized in the right ventricle.  LEFT VENTRICLE PLAX 2D LVIDd:  5.90 cm LVIDs:         3.90 cm LV PW:         1.50 cm LV IVS:        1.40 cm LVOT diam:     2.20 cm LV SV:         79 LV SV Index:   36 LVOT Area:     3.80 cm  RIGHT VENTRICLE            IVC RV Basal diam:  4.30 cm    IVC diam: 2.70 cm RV Mid diam:    4.00 cm RV S prime:     6.73 cm/s TAPSE (M-mode): 0.8 cm LEFT ATRIUM             Index       RIGHT ATRIUM           Index LA diam:        4.10 cm 1.89 cm/m  RA Area:     34.30 cm LA Vol (A2C):   85.9 ml 39.55 ml/m RA Volume:   136.00 ml 62.61 ml/m LA Vol (A4C):   63.8 ml 29.37 ml/m LA Biplane Vol: 75.6 ml 34.81 ml/m  AORTIC VALVE AV Area (Vmax):    3.19 cm AV Area (Vmean):   3.15 cm AV Area (VTI):     3.25 cm AV Vmax:           119.00 cm/s AV Vmean:          78.700 cm/s AV VTI:            0.243 m AV Peak Grad:      5.7 mmHg AV Mean Grad:      3.0 mmHg LVOT Vmax:         100.00 cm/s LVOT Vmean:        65.200 cm/s LVOT VTI:          0.208 m LVOT/AV VTI ratio: 0.86  AORTA Ao Root diam: 3.70 cm Ao Asc diam:  3.50 cm MITRAL VALVE             TRICUSPID VALVE MV Area (PHT): 2.24 cm  TR Peak grad:   25.0 mmHg MV Area VTI:   2.79 cm  TR Vmax:        250.00 cm/s MV Peak grad:  5.2 mmHg MV Mean grad:  1.0 mmHg  SHUNTS MV Vmax:       1.14 m/s  Systemic VTI:  0.21 m MV Vmean:      54.9 cm/s Systemic Diam: 2.20 cm Mihai [name] MD Electronically signed by Sanda Klein MD Signature Date/Time: 08/24/2020/11:00:05 AM    Final     Scheduled Meds:  amiodarone  200 mg Oral Daily   enoxaparin (LOVENOX) injection  40 mg Subcutaneous Q24H   furosemide  20 mg Intravenous BID    ipratropium-albuterol  3 mL Nebulization Q6H   multivitamin with minerals  1 tablet Oral Daily   pantoprazole  40 mg Oral BID   Continuous Infusions:   LOS: 0 days   Marylu Lund, MD [facility] Pager On Amion  If 7PM-7AM, please contact night-coverage 08/24/2020, 1:56 PM

## 2020-08-25 DIAGNOSIS — I5033 Acute on chronic diastolic (congestive) heart failure: Secondary | ICD-10-CM | POA: Diagnosis not present

## 2020-08-25 DIAGNOSIS — I4819 Other persistent atrial fibrillation: Secondary | ICD-10-CM

## 2020-08-25 DIAGNOSIS — I25718 Atherosclerosis of autologous vein coronary artery bypass graft(s) with other forms of angina pectoris: Secondary | ICD-10-CM | POA: Diagnosis not present

## 2020-08-25 LAB — BASIC METABOLIC PANEL
Anion gap: 8 (ref 5–15)
BUN: 28 mg/dL — ABNORMAL HIGH (ref 8–23)
CO2: 23 mmol/L (ref 22–32)
Calcium: 8.7 mg/dL — ABNORMAL LOW (ref 8.9–10.3)
Chloride: 102 mmol/L (ref 98–111)
Creatinine, Ser: 2.06 mg/dL — ABNORMAL HIGH (ref 0.61–1.24)
GFR, Estimated: 32 mL/min — ABNORMAL LOW (ref >60)
Glucose, Bld: 147 mg/dL — ABNORMAL HIGH (ref 70–99)
Potassium: 4.1 mmol/L (ref 3.5–5.1)
Sodium: 133 mmol/L — ABNORMAL LOW (ref 135–145)

## 2020-08-25 LAB — GLUCOSE, CAPILLARY
Glucose-Capillary: 134 mg/dL — ABNORMAL HIGH (ref 70–99)
Glucose-Capillary: 141 mg/dL — ABNORMAL HIGH (ref 70–99)
Glucose-Capillary: 154 mg/dL — ABNORMAL HIGH (ref 70–99)
Glucose-Capillary: 174 mg/dL — ABNORMAL HIGH (ref 70–99)

## 2020-08-25 LAB — TSH: TSH: 0.879 u[IU]/mL (ref 0.350–4.500)

## 2020-08-25 MED ORDER — FUROSEMIDE 10 MG/ML IJ SOLN: 60.0000 mg | Freq: Two times a day (BID) | INTRAMUSCULAR | Status: DC

## 2020-08-25 MED ORDER — FUROSEMIDE 10 MG/ML IJ SOLN
40.0000 mg | Freq: Two times a day (BID) | INTRAMUSCULAR | Status: DC
  Administered 2020-08-25 (×2): 40 mg via INTRAVENOUS
  Filled 2020-08-25 (×2): qty 4

## 2020-08-25 NOTE — Progress Notes (Signed)
Cardiology Progress Note  Patient ID: Alan [name] MRN: [id number] DOB: 07-02-1940 Date of Encounter: 08/25/2020  Primary Cardiologist: Sanda Klein, MD  Subjective   Chief Complaint: SOB/LE edema   HPI: Net -2.1 L overnight.  Vital stable.  Telemetry shows a paced rhythm.  Remains grossly volume overloaded.  ROS:  All other ROS reviewed and negative. Pertinent positives noted in the HPI.     Inpatient Medications  Scheduled Meds:  amiodarone  200 mg Oral Daily   amLODipine  10 mg Oral Daily   carvedilol  25 mg Oral BID WC   enoxaparin (LOVENOX) injection  40 mg Subcutaneous Q24H   furosemide  60 mg Intravenous BID   insulin aspart  0-5 Units Subcutaneous QHS   insulin aspart  0-9 Units Subcutaneous TID WC   losartan  100 mg Oral Daily   multivitamin with minerals  1 tablet Oral Daily   pantoprazole  40 mg Oral BID   Continuous Infusions:  PRN Meds: HYDROcodone-acetaminophen, ipratropium-albuterol   Vital Signs   Vitals:   08/25/20 0041 08/25/20 0044 08/25/20 0300 08/25/20 0430  BP:  132/64  129/73  Pulse:  63  66  Resp: (!) 22 18  17   Temp:    98.1 F (36.7 C)  TempSrc:    Oral  SpO2: 100% 96%  99%  Weight:   103.1 kg   Height:        Intake/Output Summary (Last 24 hours) at 08/25/2020 0757 Last data filed at 08/25/2020 5102 Gross per 24 hour  Intake 1380 ml  Output 3225 ml  Net -1845 ml   Last 3 Weights 08/25/2020 08/23/2020 07/20/2020  Weight (lbs) 227 lb 3.2 oz 225 lb 227 lb 1.6 oz  Weight (kg) 103.057 kg 102.059 kg 103.012 kg      Telemetry  Overnight telemetry shows a paced rhythm, which I personally reviewed.   Physical Exam   Vitals:   08/25/20 0041 08/25/20 0044 08/25/20 0300 08/25/20 0430  BP:  132/64  129/73  Pulse:  63  66  Resp: (!) 22 18  17   Temp:    98.1 F (36.7 C)  TempSrc:    Oral  SpO2: 100% 96%  99%  Weight:   103.1 kg   Height:        Intake/Output Summary (Last 24 hours) at 08/25/2020 0757 Last data filed at 08/25/2020  5852 Gross per 24 hour  Intake 1380 ml  Output 3225 ml  Net -1845 ml    Last 3 Weights 08/25/2020 08/23/2020 07/20/2020  Weight (lbs) 227 lb 3.2 oz 225 lb 227 lb 1.6 oz  Weight (kg) 103.057 kg 102.059 kg 103.012 kg    Body mass index is 33.55 kg/m.  General: Well nourished, well developed, in no acute distress Head: Atraumatic, normal size  Eyes: PEERLA, EOMI  Neck: Supple, JVD 7 to 10 cm of water Endocrine: No thryomegaly Cardiac: Normal S1, S2; RRR; no murmurs, rubs, or gallops Lungs: Crackles at the lung bases Abd: Soft, nontender, no hepatomegaly  Ext: 2+ pitting edema Musculoskeletal: No deformities, BUE and BLE strength normal and equal Skin: Warm and dry, no rashes   Neuro: Alert and oriented to person, place, time, and situation, CNII-XII grossly intact, no focal deficits  Psych: Normal mood and affect   Labs  High Sensitivity Troponin:   Recent Labs  Lab 08/23/20 1827 08/23/20 2205  TROPONINIHS 8 8     Cardiac EnzymesNo results for input(s): TROPONINI in the last 168 hours. No  results for input(s): TROPIPOC in the last 168 hours.  Chemistry Recent Labs  Lab 08/23/20 1827 08/24/20 0619 08/25/20 0256  NA 136  --  133*  K 4.2  --  4.1  CL 106  --  102  CO2 20*  --  23  GLUCOSE 111*  --  147*  BUN 29*  --  28*  CREATININE 1.85* 1.94* 2.06*  CALCIUM 8.3*  --  8.7*  PROT 7.0  --   --   ALBUMIN 3.7  --   --   AST 19  --   --   ALT 19  --   --   ALKPHOS 77  --   --   BILITOT 0.4  --   --   GFRNONAA 37* 35* 32*  ANIONGAP 10  --  8    Hematology Recent Labs  Lab 08/23/20 1827 08/24/20 0619  WBC 4.8 5.6  RBC 2.77* 2.83*  HGB 7.5* 7.8*  HCT 24.6* 24.9*  MCV 88.8 88.0  MCH 27.1 27.6  MCHC 30.5 31.3  RDW 15.1 14.8  PLT 133* 147*   BNP Recent Labs  Lab 08/23/20 2223  BNP 246.8*    DDimer No results for input(s): DDIMER in the last 168 hours.   Radiology  DG Chest 2 View  Result Date: 08/23/2020 CLINICAL DATA:  Shortness of breath. EXAM: CHEST  - 2 VIEW COMPARISON:  Chest x-ray 03/10/2020, CT chest 10/15/2009 FINDINGS: Left chest wall cardiac pacemaker with 3 leads in similar position. The heart size and mediastinal contours are unchanged. Aortic calcification. Cardiac surgical changes overlie the mediastinum. Coronary artery stent. No focal consolidation. No pulmonary edema. Interval development of a trace right pleural effusion. Possible trace left pleural effusion. No pneumothorax. No acute osseous abnormality. IMPRESSION: Interval development of a trace right pleural effusion. Possible trace left pleural effusion. Electronically Signed   By: Iven Finn M.D.   On: 08/23/2020 18:53   ECHOCARDIOGRAM COMPLETE  Result Date: 08/24/2020    ECHOCARDIOGRAM REPORT   Patient Name:   Alan [name] Date of Exam:    08/24/2020 Medical Rec #:  [id number]     [place]099Height: 69.0 in Accession #:    [id number]    Weight:          225.0 lb Date of Birth:  20-Sep-1940    BSA:             2.172 m Patient Age:    80 years      BP:              149/65 mmHg Patient Gender: M             HR:              63 bpm. Exam Location:  Inpatient Procedure: 2D Echo, Cardiac Doppler and Color Doppler Indications:    CHF-Acute Diastolic  History:        Patient has prior history of Echocardiogram examinations, most                 recent 01/15/2019. Prior CABG and Pacemaker, Arrythmias:Atrial                 Fibrillation, Signs/Symptoms:Shortness of Breath; Risk                 Factors:Hypertension, Diabetes, Dyslipidemia and Sleep Apnea.                 CKD.  Sonographer:    Clayton Lefort  RDCS (AE) Referring Phys: [id number] [place]  1. Left ventricular ejection fraction, by estimation, is 50 to 55%. The left ventricle has low normal function. The left ventricle has no regional wall motion abnormalities. The left ventricular internal cavity size was mildly dilated. There is mild concentric left ventricular hypertrophy. Indeterminate diastolic filling due to E-A  fusion. There is the interventricular septum is flattened in diastole ('D' shaped left ventricle), consistent with right ventricular volume overload.  2. Right ventricular systolic function is moderately reduced. The right ventricular size is severely enlarged. There is mildly elevated pulmonary artery systolic pressure.  3. Left atrial size was mild to moderately dilated.  4. Right atrial size was severely dilated.  5. The mitral valve is normal in structure. Mild to moderate mitral valve regurgitation.  6. Tricuspid valve regurgitation is moderate.  7. The aortic valve is tricuspid. Aortic valve regurgitation is not visualized.  8. The inferior vena cava is dilated in size with >50% respiratory variability, suggesting right atrial pressure of 8 mmHg. Comparison(s): Prior images reviewed side by side. The left ventricular function is unchanged. The right ventricular hypertrophy is unchanged. (the right ventricle was dilated and had moderately reduced function on the previous study as well. The right ventricular systolic function is worse. Tricuspid insufficency is worse and there is evidence of increased right atrial pressure. FINDINGS  Left Ventricle: Left ventricular ejection fraction, by estimation, is 50 to 55%. The left ventricle has low normal function. The left ventricle has no regional wall motion abnormalities. The left ventricular internal cavity size was mildly dilated. There is mild concentric left ventricular hypertrophy. Abnormal (paradoxical) septal motion, consistent with RV pacemaker and the interventricular septum is flattened in diastole ('D' shaped left ventricle), consistent with right ventricular volume overload. Left ventricular diastolic function could not be evaluated due to atrial fibrillation. Indeterminate diastolic filling due to E-A fusion. Right Ventricle: The right ventricular size is severely enlarged. No increase in right ventricular wall thickness. Right ventricular systolic  function is moderately reduced. There is mildly elevated pulmonary artery systolic pressure. The tricuspid regurgitant velocity is 2.50 m/s, and with an assumed right atrial pressure of 8 mmHg, the estimated right ventricular systolic pressure is 98.3 mmHg. Left Atrium: Left atrial size was mild to moderately dilated. Right Atrium: Right atrial size was severely dilated. Pericardium: There is no evidence of pericardial effusion. Mitral Valve: The mitral valve is normal in structure. Mild mitral annular calcification. Mild to moderate mitral valve regurgitation, with centrally-directed jet. MV peak gradient, 5.2 mmHg. The mean mitral valve gradient is 1.0 mmHg. Tricuspid Valve: The tricuspid valve is normal in structure. Tricuspid valve regurgitation is moderate. Aortic Valve: The aortic valve is tricuspid. Aortic valve regurgitation is not visualized. Aortic valve mean gradient measures 3.0 mmHg. Aortic valve peak gradient measures 5.7 mmHg. Aortic valve area, by VTI measures 3.25 cm. Pulmonic Valve: The pulmonic valve was normal in structure. Pulmonic valve regurgitation is trivial. Aorta: The aortic root and ascending aorta are structurally normal, with no evidence of dilitation. Venous: The inferior vena cava is dilated in size with greater than 50% respiratory variability, suggesting right atrial pressure of 8 mmHg. IAS/Shunts: No atrial level shunt detected by color flow Doppler. Additional Comments: A device lead is visualized in the right ventricle.  LEFT VENTRICLE PLAX 2D LVIDd:         5.90 cm LVIDs:         3.90 cm LV PW:  1.50 cm LV IVS:        1.40 cm LVOT diam:     2.20 cm LV SV:         79 LV SV Index:   36 LVOT Area:     3.80 cm  RIGHT VENTRICLE            IVC RV Basal diam:  4.30 cm    IVC diam: 2.70 cm RV Mid diam:    4.00 cm RV S prime:     6.73 cm/s TAPSE (M-mode): 0.8 cm LEFT ATRIUM             Index       RIGHT ATRIUM           Index LA diam:        4.10 cm 1.89 cm/m  RA Area:      34.30 cm LA Vol (A2C):   85.9 ml 39.55 ml/m RA Volume:   136.00 ml 62.61 ml/m LA Vol (A4C):   63.8 ml 29.37 ml/m LA Biplane Vol: 75.6 ml 34.81 ml/m  AORTIC VALVE AV Area (Vmax):    3.19 cm AV Area (Vmean):   3.15 cm AV Area (VTI):     3.25 cm AV Vmax:           119.00 cm/s AV Vmean:          78.700 cm/s AV VTI:            0.243 m AV Peak Grad:      5.7 mmHg AV Mean Grad:      3.0 mmHg LVOT Vmax:         100.00 cm/s LVOT Vmean:        65.200 cm/s LVOT VTI:          0.208 m LVOT/AV VTI ratio: 0.86  AORTA Ao Root diam: 3.70 cm Ao Asc diam:  3.50 cm MITRAL VALVE             TRICUSPID VALVE MV Area (PHT): 2.24 cm  TR Peak grad:   25.0 mmHg MV Area VTI:   2.79 cm  TR Vmax:        250.00 cm/s MV Peak grad:  5.2 mmHg MV Mean grad:  1.0 mmHg  SHUNTS MV Vmax:       1.14 m/s  Systemic VTI:  0.21 m MV Vmean:      54.9 cm/s Systemic Diam: 2.20 cm Sanda Klein MD Electronically signed by Sanda Klein MD Signature Date/Time: 08/24/2020/11:00:05 AM    Final     Cardiac Studies  TTE 08/24/2020  1. Left ventricular ejection fraction, by estimation, is 50 to 55%. The  left ventricle has low normal function. The left ventricle has no regional  wall motion abnormalities. The left ventricular internal cavity size was  mildly dilated. There is mild  concentric left ventricular hypertrophy. Indeterminate diastolic filling  due to E-A fusion. There is the interventricular septum is flattened in  diastole ('D' shaped left ventricle), consistent with right ventricular  volume overload.   2. Right ventricular systolic function is moderately reduced. The right  ventricular size is severely enlarged. There is mildly elevated pulmonary  artery systolic pressure.   3. Left atrial size was mild to moderately dilated.   4. Right atrial size was severely dilated.   5. The mitral valve is normal in structure. Mild to moderate mitral valve  regurgitation.   6. Tricuspid valve regurgitation is moderate.   7. The aortic  valve  is tricuspid. Aortic valve regurgitation is not  visualized.   8. The inferior vena cava is dilated in size with >50% respiratory  variability, suggesting right atrial pressure of 8 mmHg.   Patient Profile  ADALBERTO METZGAR is a 80 y.o. male with CAD status post CABG, diastolic heart failure, paroxysmal atrial fibrillation (not on anticoagulation due to recurrent GI bleeding), recurrent GI bleed, CKD stage III, hypertension, obesity, diabetes who was admitted on 08/23/2020 for acute on chronic diastolic heart failure.  Assessment & Plan   Acute on chronic diastolic heart failure -Remains grossly volume overloaded.  Net -2.1 L overnight.  Net -4.2 L since admission. -Increase Lasix IV to 40 mg twice daily.  Goal 3 to 5 L of urine output daily.  2.  Persistent atrial fibrillation/SSS s/p ppm -Telemetry shows a paced rhythm. -Continue amiodarone 200 mg daily for rhythm control. -Not on anticoagulation due to recurrent GI bleed. -ppm stable   3.  Hypertension -Continue amlodipine 10 mg daily, Coreg 25 twice daily, losartan 100 mg daily  4. CAD s/p CABG -no sx angina. 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  > 50% of time was spent in direct patient care.    Signed, Addison Naegeli. Audie Box, MD, [place]  08/25/2020 7:57 AM

## 2020-08-25 NOTE — Progress Notes (Signed)
Visit made to patients room.  Set patient up on CPAP with nasal mask which is his home regimen.

## 2020-08-25 NOTE — Progress Notes (Signed)
Pt. Refused cpap. 

## 2020-08-25 NOTE — Progress Notes (Signed)
PROGRESS NOTE    Alan [name]  RJJ:[id number] DOB: 1940-10-31 DOA: 08/23/2020 PCP: [name], MD    Brief Narrative:  80 y.o. male with medical history significant for childhood polio, right SI fusion (07/20/20), chronic anemia, paroxysmal A. fib on Eliquis, hypertension, type 2 diabetes, GERD, who presented initially to [place] ED at the recommendation of his pulmonologist due to progressively worsening nonexertional dyspnea.  Associated with lower extremity edema, elevated JVD, elevated BNP with bnp of 246.  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   CKD 3B Appears to be at his baseline creatinine Monitor urine output Avoid nephrotoxic agents and hypotension.  HTN BP stable at present Continue norvasc, coreg, losartan    DVT prophylaxis: Lovenox subq Code Status: Full Family Communication: Pt in room, family not at bedside  Status is:  Inpatient  Remains inpatient appropriate because:Inpatient level of care appropriate due to severity of illness  Dispo: The patient is from: Home              Anticipated d/c is to: Home              Patient currently is not medically stable to d/c.   Difficult to place patient No       Consultants:  Cardiology  Procedures:    Antimicrobials: Anti-infectives (From admission, onward)    None       Subjective: Patient reports some improvement in dyspnea, still appears to be overloaded overall, as any chest pain, abdominal pain, nausea/vomiting, fever/chills.  Objective: Vitals:   08/25/20 0430 08/25/20 0928 08/25/20 1104 08/25/20 1541  BP: 129/73 108/62 121/61 118/64  Pulse: 66 62 62 64  Resp: 17  19 14   Temp: 98.1 F (36.7 C) 97.7 F (36.5 C) 97.8 F (36.6 C) 97.8 F (36.6 C)  TempSrc: Oral Oral Oral Oral  SpO2: 99% 100% 99% 100%  Weight:      Height:        Intake/Output Summary (Last 24 hours) at 08/25/2020 1749 Last data filed at 08/25/2020 1312 Gross per 24 hour  Intake 954 ml  Output 3583 ml  Net -2629 ml   Filed Weights   08/23/20 1823 08/25/20 0300  Weight: 102.1 kg 103.1 kg    Examination: General: NAD  Cardiovascular: S1, S2 present Respiratory: CTAB Abdomen: Soft, nontender, nondistended, bowel sounds present Musculoskeletal: ++ bilateral pedal edema noted Skin: Normal Psychiatry: Normal mood    Data Reviewed: I  have personally reviewed following labs and imaging studies  CBC: Recent Labs  Lab 08/23/20 1827 08/24/20 0619  WBC 4.8 5.6  HGB 7.5* 7.8*  HCT 24.6* 24.9*  MCV 88.8 88.0  PLT 133* 149*   Basic Metabolic Panel: Recent Labs  Lab 08/23/20 1827 08/24/20 0619 08/25/20 0256  NA 136  --  133*  K 4.2  --  4.1  CL 106  --  102  CO2 20*  --  23  GLUCOSE 111*  --  147*  BUN 29*  --  28*  CREATININE 1.85* 1.94* 2.06*  CALCIUM 8.3*  --  8.7*  MG  --  2.2  --   PHOS  --  3.4  --    GFR: Estimated Creatinine  Clearance: 35 mL/min (A) (by C-G formula based on SCr of 2.06 mg/dL (H)). Liver Function Tests: Recent Labs  Lab 08/23/20 1827  AST 19  ALT 19  ALKPHOS 77  BILITOT 0.4  PROT 7.0  ALBUMIN 3.7   No results for input(s): LIPASE, AMYLASE in the last 168 hours. No results for input(s): AMMONIA in the last 168 hours. Coagulation Profile: No results for input(s): INR, PROTIME in the last 168 hours. Cardiac Enzymes: No results for input(s): CKTOTAL, CKMB, CKMBINDEX, TROPONINI in the last 168 hours. BNP (last 3 results) No results for input(s): PROBNP in the last 8760 hours. HbA1C: Recent Labs    08/24/20 1420  HGBA1C 6.4*   CBG: Recent Labs  Lab 08/24/20 1549 08/24/20 2101 08/25/20 0603 08/25/20 1106 08/25/20 1643  GLUCAP 153* 153* 134* 141* 154*   Lipid Profile: No results for input(s): CHOL, HDL, LDLCALC, TRIG, CHOLHDL, LDLDIRECT in the last 72 hours. Thyroid Function Tests: Recent Labs    08/25/20 0256  TSH 0.879   Anemia Panel: No results for input(s): VITAMINB12, FOLATE, FERRITIN, TIBC, IRON, RETICCTPCT in the last 72 hours. Sepsis Labs: No results for input(s): PROCALCITON, LATICACIDVEN in the last 168 hours.  Recent Results (from the past 240 hour(s))  Resp Panel by RT-PCR (Flu A&B, Covid) Nasopharyngeal Swab     Status: None   Collection Time: 08/23/20 10:40 PM   Specimen: Nasopharyngeal Swab; Nasopharyngeal(NP) swabs in vial transport medium  Result Value Ref Range Status   SARS Coronavirus 2 by RT PCR NEGATIVE NEGATIVE Final    Comment: (NOTE) SARS-CoV-2 target nucleic acids are NOT DETECTED.  The SARS-CoV-2 RNA is generally detectable in upper respiratory specimens during the acute phase of infection. The lowest concentration of SARS-CoV-2 viral copies this assay can detect is 138 copies/mL. A negative result does not preclude SARS-Cov-2 infection and should not be used as the sole basis for treatment or other patient management decisions. A negative  result may occur with  improper specimen collection/handling, submission of specimen other than nasopharyngeal swab, presence of viral mutation(s) within the areas targeted by this assay, and inadequate number of viral copies(<138 copies/mL). A negative result must be combined with clinical observations, patient history, and epidemiological information. The expected result is Negative.  Fact Sheet for Patients:  EntrepreneurPulse.com.au  Fact Sheet for Healthcare Providers:  IncredibleEmployment.be  This test is no t yet approved or cleared by the [place] FDA and  has been authorized for detection and/or diagnosis of SARS-CoV-2 by FDA under an Emergency Use Authorization (EUA). This EUA will remain  in effect (meaning this test can be used) for the duration of the COVID-19 declaration under Section 564(b)(1) of the Act, 21 U.S.C.section 360bbb-3(b)(1), unless the authorization is terminated  or revoked sooner.       Influenza A by PCR NEGATIVE NEGATIVE Final   Influenza B by PCR NEGATIVE NEGATIVE Final    Comment: (NOTE) The Xpert Xpress SARS-CoV-2/FLU/RSV plus assay is intended as an aid in the diagnosis of influenza from Nasopharyngeal swab specimens and should not be used as a sole basis for treatment. Nasal washings and aspirates are unacceptable for Xpert Xpress SARS-CoV-2/FLU/RSV testing.  Fact Sheet for Patients: EntrepreneurPulse.com.au  Fact Sheet for Healthcare Providers: IncredibleEmployment.be  This test is not yet approved or cleared by the [place] FDA and has been authorized for detection and/or diagnosis of SARS-CoV-2 by FDA under an Emergency Use Authorization (EUA). This EUA will remain in effect (meaning this test can be used) for the duration of the COVID-19 declaration under Section 564(b)(1) of the Act, 21 U.S.C. section 360bbb-3(b)(1), unless the authorization is  terminated or revoked.  Performed at [facility], [address], [place],  [address]      Radiology Studies: DG Chest 2 View  Result Date: 08/23/2020 CLINICAL DATA:  Shortness of breath. EXAM: CHEST - 2 VIEW COMPARISON:  Chest x-ray 03/10/2020, CT chest 10/15/2009 FINDINGS: Left chest wall cardiac pacemaker with 3 leads in similar position. The heart size and mediastinal contours are unchanged. Aortic calcification. Cardiac surgical changes overlie the mediastinum. Coronary artery stent. No focal consolidation. No pulmonary edema. Interval development of a trace right pleural effusion. Possible trace left pleural effusion. No pneumothorax. No acute osseous abnormality. IMPRESSION: Interval development of a trace right pleural effusion. Possible trace left pleural effusion. Electronically Signed   By: Iven Finn M.D.   On: 08/23/2020 18:53   ECHOCARDIOGRAM COMPLETE  Result Date: 08/24/2020    ECHOCARDIOGRAM REPORT   Patient Name:   BHARAT ANTILLON Date of Exam:    08/24/2020 Medical Rec #:  [id number]     [place]099Height: 69.0 in Accession #:    [id number]    Weight:          225.0 lb Date of Birth:  08/02/1940    BSA:             2.172 m Patient Age:    49 years      BP:              149/65 mmHg Patient Gender: M             HR:              63 bpm. Exam Location:  Inpatient Procedure: 2D Echo, Cardiac Doppler and Color Doppler Indications:    CHF-Acute Diastolic  History:        Patient has prior history of Echocardiogram examinations, most                 recent 01/15/2019. Prior CABG and Pacemaker, Arrythmias:Atrial                 Fibrillation, Signs/Symptoms:Shortness of Breath; Risk                 Factors:Hypertension, Diabetes, Dyslipidemia and Sleep Apnea.                 CKD.  Sonographer:    Clayton Lefort RDCS (AE) Referring Phys: [id number] [place]  1. Left ventricular ejection fraction, by estimation, is 50 to 55%. The left ventricle has low  normal function. The left ventricle has no regional wall motion abnormalities. The left ventricular internal cavity size was mildly  dilated. There is mild concentric left ventricular hypertrophy. Indeterminate diastolic filling due to E-A fusion. There is the interventricular septum is flattened in diastole ('D' shaped left ventricle), consistent with right ventricular volume overload.  2. Right ventricular systolic function is moderately reduced. The right ventricular size is severely enlarged. There is mildly elevated pulmonary artery systolic pressure.  3. Left atrial size was mild to moderately dilated.  4. Right atrial size was severely dilated.  5. The mitral valve is normal in structure. Mild to moderate mitral valve regurgitation.  6. Tricuspid valve regurgitation is moderate.  7. The aortic valve is tricuspid. Aortic valve regurgitation is not visualized.  8. The inferior vena cava is dilated in size with >50% respiratory variability, suggesting right atrial pressure of 8 mmHg. Comparison(s): Prior images reviewed side by side. The left ventricular function is unchanged. The right ventricular hypertrophy is unchanged. (the right ventricle was dilated and had moderately reduced function on the previous study as well. The right ventricular systolic function is worse. Tricuspid insufficency is worse and there is evidence of increased right atrial pressure. FINDINGS  Left Ventricle: Left ventricular ejection fraction, by estimation, is 50 to 55%. The left ventricle has low normal function. The left ventricle has no regional wall motion abnormalities. The left ventricular internal cavity size was mildly dilated. There is mild concentric left ventricular hypertrophy. Abnormal (paradoxical) septal motion, consistent with RV pacemaker and the interventricular septum is flattened in diastole ('D' shaped left ventricle), consistent with right ventricular volume overload. Left ventricular diastolic function could not  be evaluated due to atrial fibrillation. Indeterminate diastolic filling due to E-A fusion. Right Ventricle: The right ventricular size is severely enlarged. No increase in right ventricular wall thickness. Right ventricular systolic function is moderately reduced. There is mildly elevated pulmonary artery systolic pressure. The tricuspid regurgitant velocity is 2.50 m/s, and with an assumed right atrial pressure of 8 mmHg, the estimated right ventricular systolic pressure is 95.6 mmHg. Left Atrium: Left atrial size was mild to moderately dilated. Right Atrium: Right atrial size was severely dilated. Pericardium: There is no evidence of pericardial effusion. Mitral Valve: The mitral valve is normal in structure. Mild mitral annular calcification. Mild to moderate mitral valve regurgitation, with centrally-directed jet. MV peak gradient, 5.2 mmHg. The mean mitral valve gradient is 1.0 mmHg. Tricuspid Valve: The tricuspid valve is normal in structure. Tricuspid valve regurgitation is moderate. Aortic Valve: The aortic valve is tricuspid. Aortic valve regurgitation is not visualized. Aortic valve mean gradient measures 3.0 mmHg. Aortic valve peak gradient measures 5.7 mmHg. Aortic valve area, by VTI measures 3.25 cm. Pulmonic Valve: The pulmonic valve was normal in structure. Pulmonic valve regurgitation is trivial. Aorta: The aortic root and ascending aorta are structurally normal, with no evidence of dilitation. Venous: The inferior vena cava is dilated in size with greater than 50% respiratory variability, suggesting right atrial pressure of 8 mmHg. IAS/Shunts: No atrial level shunt detected by color flow Doppler. Additional Comments: A device lead is visualized in the right ventricle.  LEFT VENTRICLE PLAX 2D LVIDd:         5.90 cm LVIDs:         3.90 cm LV PW:         1.50 cm LV IVS:        1.40 cm LVOT diam:     2.20 cm LV SV:         79 LV SV Index:   36 LVOT Area:     3.80  cm  RIGHT VENTRICLE            IVC RV  Basal diam:  4.30 cm    IVC diam: 2.70 cm RV Mid diam:    4.00 cm RV S prime:     6.73 cm/s TAPSE (M-mode): 0.8 cm LEFT ATRIUM             Index       RIGHT ATRIUM           Index LA diam:        4.10 cm 1.89 cm/m  RA Area:     34.30 cm LA Vol (A2C):   85.9 ml 39.55 ml/m RA Volume:   136.00 ml 62.61 ml/m LA Vol (A4C):   63.8 ml 29.37 ml/m LA Biplane Vol: 75.6 ml 34.81 ml/m  AORTIC VALVE AV Area (Vmax):    3.19 cm AV Area (Vmean):   3.15 cm AV Area (VTI):     3.25 cm AV Vmax:           119.00 cm/s AV Vmean:          78.700 cm/s AV VTI:            0.243 m AV Peak Grad:      5.7 mmHg AV Mean Grad:      3.0 mmHg LVOT Vmax:         100.00 cm/s LVOT Vmean:        65.200 cm/s LVOT VTI:          0.208 m LVOT/AV VTI ratio: 0.86  AORTA Ao Root diam: 3.70 cm Ao Asc diam:  3.50 cm MITRAL VALVE             TRICUSPID VALVE MV Area (PHT): 2.24 cm  TR Peak grad:   25.0 mmHg MV Area VTI:   2.79 cm  TR Vmax:        250.00 cm/s MV Peak grad:  5.2 mmHg MV Mean grad:  1.0 mmHg  SHUNTS MV Vmax:       1.14 m/s  Systemic VTI:  0.21 m MV Vmean:      54.9 cm/s Systemic Diam: 2.20 cm Mihai [name] MD Electronically signed by Sanda Klein MD Signature Date/Time: 08/24/2020/11:00:05 AM    Final     Scheduled Meds:  amiodarone  200 mg Oral Daily   amLODipine  10 mg Oral Daily   carvedilol  25 mg Oral BID WC   enoxaparin (LOVENOX) injection  40 mg Subcutaneous Q24H   furosemide  40 mg Intravenous BID   insulin aspart  0-5 Units Subcutaneous QHS   insulin aspart  0-9 Units Subcutaneous TID WC   losartan  100 mg Oral Daily   multivitamin with minerals  1 tablet Oral Daily   pantoprazole  40 mg Oral BID   Continuous Infusions:   LOS: 1 day   Alma Friendly, MD [facility] Pager On Amion  If 7PM-7AM, please contact night-coverage 08/25/2020, 5:49 PM

## 2020-08-25 NOTE — Plan of Care (Signed)

## 2020-08-26 DIAGNOSIS — I25718 Atherosclerosis of autologous vein coronary artery bypass graft(s) with other forms of angina pectoris: Secondary | ICD-10-CM | POA: Diagnosis not present

## 2020-08-26 DIAGNOSIS — I5033 Acute on chronic diastolic (congestive) heart failure: Secondary | ICD-10-CM | POA: Diagnosis not present

## 2020-08-26 DIAGNOSIS — I4819 Other persistent atrial fibrillation: Secondary | ICD-10-CM | POA: Diagnosis not present

## 2020-08-26 DIAGNOSIS — D500 Iron deficiency anemia secondary to blood loss (chronic): Secondary | ICD-10-CM | POA: Diagnosis not present

## 2020-08-26 LAB — CBC WITH DIFFERENTIAL/PLATELET
Abs Immature Granulocytes: 0.02 10*3/uL (ref 0.00–0.07)
Basophils Absolute: 0 10*3/uL (ref 0.0–0.1)
Basophils Relative: 1 %
Eosinophils Absolute: 0.2 10*3/uL (ref 0.0–0.5)
Eosinophils Relative: 3 %
HCT: 23 % — ABNORMAL LOW (ref 39.0–52.0)
Hemoglobin: 7.1 g/dL — ABNORMAL LOW (ref 13.0–17.0)
Immature Granulocytes: 0 %
Lymphocytes Relative: 20 %
Lymphs Abs: 0.9 10*3/uL (ref 0.7–4.0)
MCH: 27 pg (ref 26.0–34.0)
MCHC: 30.9 g/dL (ref 30.0–36.0)
MCV: 87.5 fL (ref 80.0–100.0)
Monocytes Absolute: 0.7 10*3/uL (ref 0.1–1.0)
Monocytes Relative: 16 %
Neutro Abs: 2.8 10*3/uL (ref 1.7–7.7)
Neutrophils Relative %: 60 %
Platelets: 134 10*3/uL — ABNORMAL LOW (ref 150–400)
RBC: 2.63 MIL/uL — ABNORMAL LOW (ref 4.22–5.81)
RDW: 14.7 % (ref 11.5–15.5)
WBC: 4.6 10*3/uL (ref 4.0–10.5)
nRBC: 0 % (ref 0.0–0.2)

## 2020-08-26 LAB — VITAMIN B12: Vitamin B-12: 591 pg/mL (ref 180–914)

## 2020-08-26 LAB — BASIC METABOLIC PANEL
Anion gap: 11 (ref 5–15)
BUN: 31 mg/dL — ABNORMAL HIGH (ref 8–23)
CO2: 24 mmol/L (ref 22–32)
Calcium: 8.8 mg/dL — ABNORMAL LOW (ref 8.9–10.3)
Chloride: 99 mmol/L (ref 98–111)
Creatinine, Ser: 2.3 mg/dL — ABNORMAL HIGH (ref 0.61–1.24)
GFR, Estimated: 28 mL/min — ABNORMAL LOW (ref >60)
Glucose, Bld: 130 mg/dL — ABNORMAL HIGH (ref 70–99)
Potassium: 3.8 mmol/L (ref 3.5–5.1)
Sodium: 134 mmol/L — ABNORMAL LOW (ref 135–145)

## 2020-08-26 LAB — IRON AND TIBC
Iron: 24 ug/dL — ABNORMAL LOW (ref 45–182)
Saturation Ratios: 5 % — ABNORMAL LOW (ref 17.9–39.5)
TIBC: 449 ug/dL (ref 250–450)
UIBC: 425 ug/dL

## 2020-08-26 LAB — GLUCOSE, CAPILLARY
Glucose-Capillary: 124 mg/dL — ABNORMAL HIGH (ref 70–99)
Glucose-Capillary: 159 mg/dL — ABNORMAL HIGH (ref 70–99)
Glucose-Capillary: 191 mg/dL — ABNORMAL HIGH (ref 70–99)
Glucose-Capillary: 185 mg/dL — ABNORMAL HIGH (ref 70–99)

## 2020-08-26 LAB — PREPARE RBC (CROSSMATCH)

## 2020-08-26 LAB — FOLATE: Folate: 27.8 ng/mL (ref >5.9)

## 2020-08-26 LAB — HEMOGLOBIN AND HEMATOCRIT, BLOOD
HCT: 27.4 % — ABNORMAL LOW (ref 39.0–52.0)
Hemoglobin: 8.6 g/dL — ABNORMAL LOW (ref 13.0–17.0)

## 2020-08-26 LAB — FERRITIN: Ferritin: 14 ng/mL — ABNORMAL LOW (ref 24–336)

## 2020-08-26 MED ORDER — FERROUS SULFATE 325 (65 FE) MG PO TABS
325.0000 mg | ORAL_TABLET | Freq: Every day | ORAL | Status: DC
  Administered 2020-08-27: 325 mg via ORAL
  Filled 2020-08-26: qty 1

## 2020-08-26 MED ORDER — SODIUM CHLORIDE 0.9% IV SOLUTION: Freq: Once | INTRAVENOUS | Status: AC

## 2020-08-26 MED ORDER — FUROSEMIDE 10 MG/ML IJ SOLN
40.0000 mg | Freq: Two times a day (BID) | INTRAMUSCULAR | Status: AC
  Administered 2020-08-26 (×2): 40 mg via INTRAVENOUS
  Filled 2020-08-26 (×2): qty 4

## 2020-08-26 NOTE — Progress Notes (Signed)
Cardiology Progress Note  Patient ID: Alan [name] MRN: [id number] DOB: 02/01/1941 Date of Encounter: 08/26/2020  Primary Cardiologist: Sanda Klein, MD  Subjective   Chief Complaint: Shortness of breath  HPI: Shortness of breath not back to baseline.  Still volume up.  Creatinine stable.  Hemoglobin continues to decline.  Net -2.2 L overnight.  ROS:  All other ROS reviewed and negative. Pertinent positives noted in the HPI.     Inpatient Medications  Scheduled Meds:  amiodarone  200 mg Oral Daily   amLODipine  10 mg Oral Daily   carvedilol  25 mg Oral BID WC   enoxaparin (LOVENOX) injection  40 mg Subcutaneous Q24H   furosemide  40 mg Intravenous BID   insulin aspart  0-5 Units Subcutaneous QHS   insulin aspart  0-9 Units Subcutaneous TID WC   losartan  100 mg Oral Daily   multivitamin with minerals  1 tablet Oral Daily   pantoprazole  40 mg Oral BID   Continuous Infusions:  PRN Meds: HYDROcodone-acetaminophen, ipratropium-albuterol   Vital Signs   Vitals:   08/25/20 1816 08/25/20 1949 08/26/20 0532 08/26/20 0556  BP: (!) 119/58 139/60 134/60   Pulse: 63 68 65   Resp:  16 18   Temp: 98.1 F (36.7 C) 98 F (36.7 C) 98.1 F (36.7 C)   TempSrc: Oral Oral Oral   SpO2: 100% 100% 98%   Weight:    99 kg  Height:        Intake/Output Summary (Last 24 hours) at 08/26/2020 1014 Last data filed at 08/26/2020 0856 Gross per 24 hour  Intake 1549 ml  Output 4450 ml  Net -2901 ml   Last 3 Weights 08/26/2020 08/25/2020 08/23/2020  Weight (lbs) 218 lb 3.2 oz 227 lb 3.2 oz 225 lb  Weight (kg) 98.975 kg 103.057 kg 102.059 kg      Telemetry  Overnight telemetry shows a paced rhythm heart rate 60s, which I personally reviewed.  Physical Exam   Vitals:   08/25/20 1816 08/25/20 1949 08/26/20 0532 08/26/20 0556  BP: (!) 119/58 139/60 134/60   Pulse: 63 68 65   Resp:  16 18   Temp: 98.1 F (36.7 C) 98 F (36.7 C) 98.1 F (36.7 C)   TempSrc: Oral Oral Oral   SpO2:  100% 100% 98%   Weight:    99 kg  Height:        Intake/Output Summary (Last 24 hours) at 08/26/2020 1014 Last data filed at 08/26/2020 0856 Gross per 24 hour  Intake 1549 ml  Output 4450 ml  Net -2901 ml    Last 3 Weights 08/26/2020 08/25/2020 08/23/2020  Weight (lbs) 218 lb 3.2 oz 227 lb 3.2 oz 225 lb  Weight (kg) 98.975 kg 103.057 kg 102.059 kg    Body mass index is 32.22 kg/m.  General: Well nourished, well developed, in no acute distress Head: Atraumatic, normal size  Eyes: PEERLA, EOMI  Neck: Supple, JVD 7 to 10 cm of water Endocrine: No thryomegaly Cardiac: Normal S1, S2; RRR; no murmurs, rubs, or gallops Lungs: Crackles at lung bases Abd: Soft, nontender, no hepatomegaly  Ext: 1+ pitting edema Musculoskeletal: No deformities, BUE and BLE strength normal and equal Skin: Warm and dry, no rashes   Neuro: Alert and oriented to person, place, time, and situation, CNII-XII grossly intact, no focal deficits  Psych: Normal mood and affect   Labs  High Sensitivity Troponin:   Recent Labs  Lab 08/23/20 1827 08/23/20 2205  TROPONINIHS 8 8     Cardiac EnzymesNo results for input(s): TROPONINI in the last 168 hours. No results for input(s): TROPIPOC in the last 168 hours.  Chemistry Recent Labs  Lab 08/23/20 1827 08/24/20 0619 08/25/20 0256 08/26/20 0413  NA 136  --  133* 134*  K 4.2  --  4.1 3.8  CL 106  --  102 99  CO2 20*  --  23 24  GLUCOSE 111*  --  147* 130*  BUN 29*  --  28* 31*  CREATININE 1.85* 1.94* 2.06* 2.30*  CALCIUM 8.3*  --  8.7* 8.8*  PROT 7.0  --   --   --   ALBUMIN 3.7  --   --   --   AST 19  --   --   --   ALT 19  --   --   --   ALKPHOS 77  --   --   --   BILITOT 0.4  --   --   --   GFRNONAA 37* 35* 32* 28*  ANIONGAP 10  --  8 11    Hematology Recent Labs  Lab 08/23/20 1827 08/24/20 0619 08/26/20 0413  WBC 4.8 5.6 4.6  RBC 2.77* 2.83* 2.63*  HGB 7.5* 7.8* 7.1*  HCT 24.6* 24.9* 23.0*  MCV 88.8 88.0 87.5  MCH 27.1 27.6 27.0  MCHC 30.5  31.3 30.9  RDW 15.1 14.8 14.7  PLT 133* 147* 134*   BNP Recent Labs  Lab 08/23/20 2223  BNP 246.8*    DDimer No results for input(s): DDIMER in the last 168 hours.   Radiology  ECHOCARDIOGRAM COMPLETE  Result Date: 08/24/2020    ECHOCARDIOGRAM REPORT   Patient Name:   Alan [name] Date of Exam:    08/24/2020 Medical Rec #:  [id number]     [place]099Height: 69.0 in Accession #:    [id number]    Weight:          225.0 lb Date of Birth:  1940-05-16    BSA:             2.172 m Patient Age:    80 years      BP:              149/65 mmHg Patient Gender: M             HR:              63 bpm. Exam Location:  Inpatient Procedure: 2D Echo, Cardiac Doppler and Color Doppler Indications:    CHF-Acute Diastolic  History:        Patient has prior history of Echocardiogram examinations, most                 recent 01/15/2019. Prior CABG and Pacemaker, Arrythmias:Atrial                 Fibrillation, Signs/Symptoms:Shortness of Breath; Risk                 Factors:Hypertension, Diabetes, Dyslipidemia and Sleep Apnea.                 CKD.  Sonographer:    Clayton Lefort RDCS (AE) Referring Phys: [id number] [place]  1. Left ventricular ejection fraction, by estimation, is 50 to 55%. The left ventricle has low normal function. The left ventricle has no regional wall motion abnormalities. The left ventricular internal cavity size was mildly dilated. There is mild concentric left ventricular hypertrophy.  Indeterminate diastolic filling due to E-A fusion. There is the interventricular septum is flattened in diastole ('D' shaped left ventricle), consistent with right ventricular volume overload.  2. Right ventricular systolic function is moderately reduced. The right ventricular size is severely enlarged. There is mildly elevated pulmonary artery systolic pressure.  3. Left atrial size was mild to moderately dilated.  4. Right atrial size was severely dilated.  5. The mitral valve is normal in structure. Mild to  moderate mitral valve regurgitation.  6. Tricuspid valve regurgitation is moderate.  7. The aortic valve is tricuspid. Aortic valve regurgitation is not visualized.  8. The inferior vena cava is dilated in size with >50% respiratory variability, suggesting right atrial pressure of 8 mmHg. Comparison(s): Prior images reviewed side by side. The left ventricular function is unchanged. The right ventricular hypertrophy is unchanged. (the right ventricle was dilated and had moderately reduced function on the previous study as well. The right ventricular systolic function is worse. Tricuspid insufficency is worse and there is evidence of increased right atrial pressure. FINDINGS  Left Ventricle: Left ventricular ejection fraction, by estimation, is 50 to 55%. The left ventricle has low normal function. The left ventricle has no regional wall motion abnormalities. The left ventricular internal cavity size was mildly dilated. There is mild concentric left ventricular hypertrophy. Abnormal (paradoxical) septal motion, consistent with RV pacemaker and the interventricular septum is flattened in diastole ('D' shaped left ventricle), consistent with right ventricular volume overload. Left ventricular diastolic function could not be evaluated due to atrial fibrillation. Indeterminate diastolic filling due to E-A fusion. Right Ventricle: The right ventricular size is severely enlarged. No increase in right ventricular wall thickness. Right ventricular systolic function is moderately reduced. There is mildly elevated pulmonary artery systolic pressure. The tricuspid regurgitant velocity is 2.50 m/s, and with an assumed right atrial pressure of 8 mmHg, the estimated right ventricular systolic pressure is 02.5 mmHg. Left Atrium: Left atrial size was mild to moderately dilated. Right Atrium: Right atrial size was severely dilated. Pericardium: There is no evidence of pericardial effusion. Mitral Valve: The mitral valve is normal in  structure. Mild mitral annular calcification. Mild to moderate mitral valve regurgitation, with centrally-directed jet. MV peak gradient, 5.2 mmHg. The mean mitral valve gradient is 1.0 mmHg. Tricuspid Valve: The tricuspid valve is normal in structure. Tricuspid valve regurgitation is moderate. Aortic Valve: The aortic valve is tricuspid. Aortic valve regurgitation is not visualized. Aortic valve mean gradient measures 3.0 mmHg. Aortic valve peak gradient measures 5.7 mmHg. Aortic valve area, by VTI measures 3.25 cm. Pulmonic Valve: The pulmonic valve was normal in structure. Pulmonic valve regurgitation is trivial. Aorta: The aortic root and ascending aorta are structurally normal, with no evidence of dilitation. Venous: The inferior vena cava is dilated in size with greater than 50% respiratory variability, suggesting right atrial pressure of 8 mmHg. IAS/Shunts: No atrial level shunt detected by color flow Doppler. Additional Comments: A device lead is visualized in the right ventricle.  LEFT VENTRICLE PLAX 2D LVIDd:         5.90 cm LVIDs:         3.90 cm LV PW:         1.50 cm LV IVS:        1.40 cm LVOT diam:     2.20 cm LV SV:         79 LV SV Index:   36 LVOT Area:     3.80 cm  RIGHT VENTRICLE  IVC RV Basal diam:  4.30 cm    IVC diam: 2.70 cm RV Mid diam:    4.00 cm RV S prime:     6.73 cm/s TAPSE (M-mode): 0.8 cm LEFT ATRIUM             Index       RIGHT ATRIUM           Index LA diam:        4.10 cm 1.89 cm/m  RA Area:     34.30 cm LA Vol (A2C):   85.9 ml 39.55 ml/m RA Volume:   136.00 ml 62.61 ml/m LA Vol (A4C):   63.8 ml 29.37 ml/m LA Biplane Vol: 75.6 ml 34.81 ml/m  AORTIC VALVE AV Area (Vmax):    3.19 cm AV Area (Vmean):   3.15 cm AV Area (VTI):     3.25 cm AV Vmax:           119.00 cm/s AV Vmean:          78.700 cm/s AV VTI:            0.243 m AV Peak Grad:      5.7 mmHg AV Mean Grad:      3.0 mmHg LVOT Vmax:         100.00 cm/s LVOT Vmean:        65.200 cm/s LVOT VTI:           0.208 m LVOT/AV VTI ratio: 0.86  AORTA Ao Root diam: 3.70 cm Ao Asc diam:  3.50 cm MITRAL VALVE             TRICUSPID VALVE MV Area (PHT): 2.24 cm  TR Peak grad:   25.0 mmHg MV Area VTI:   2.79 cm  TR Vmax:        250.00 cm/s MV Peak grad:  5.2 mmHg MV Mean grad:  1.0 mmHg  SHUNTS MV Vmax:       1.14 m/s  Systemic VTI:  0.21 m MV Vmean:      54.9 cm/s Systemic Diam: 2.20 cm Sanda Klein MD Electronically signed by Sanda Klein MD Signature Date/Time: 08/24/2020/11:00:05 AM    Final     Cardiac Studies  TTE 08/24/2020  1. Left ventricular ejection fraction, by estimation, is 50 to 55%. The  left ventricle has low normal function. The left ventricle has no regional  wall motion abnormalities. The left ventricular internal cavity size was  mildly dilated. There is mild  concentric left ventricular hypertrophy. Indeterminate diastolic filling  due to E-A fusion. There is the interventricular septum is flattened in  diastole ('D' shaped left ventricle), consistent with right ventricular  volume overload.   2. Right ventricular systolic function is moderately reduced. The right  ventricular size is severely enlarged. There is mildly elevated pulmonary  artery systolic pressure.   3. Left atrial size was mild to moderately dilated.   4. Right atrial size was severely dilated.   5. The mitral valve is normal in structure. Mild to moderate mitral valve  regurgitation.   6. Tricuspid valve regurgitation is moderate.   7. The aortic valve is tricuspid. Aortic valve regurgitation is not  visualized.   8. The inferior vena cava is dilated in size with >50% respiratory  variability, suggesting right atrial pressure of 8 mmHg.   Patient Profile  Alan [name] is a 80 y.o. male with CAD status post CABG, diastolic heart failure, paroxysmal atrial fibrillation (not on anticoagulation due to  recurrent GI bleeding), recurrent GI bleed, CKD stage III, hypertension, obesity, diabetes who was admitted on  08/23/2020 for acute on chronic diastolic heart failure.  Assessment & Plan   Acute on chronic diastolic heart failure -Good diuresis.  Creatinine 2.3.  Baseline appears to be between 1.8-2.0.  Not far off.  He is net -2.2 L overnight.  Net -6.8 L since admission. -Would recommend 1 more day of IV diuretic therapy.  40 mg IV twice daily today and stop.  Transition to oral diuretics tomorrow. -BP stable.  2.  Persistent atrial fibrillation status post pacemaker implant -Telemetry shows a paced rhythm.  Maintaining sinus rhythm.  Continue amnio 200 mg daily. -No anticoagulation due to GI bleeding in past.  Hemoglobin continues to decline.  3.  Hypertension -Continue home antihypertensive agents  4.  CAD status post CABG -No aspirin due to recurrent GI bleed.  Hemoglobin dropping.  On beta-blocker.  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  5.  Anemia -Hemoglobin continues to decline.  No bleeding reported.  May need transfusion.  We will defer this to hospital medicine.  For questions or updates, please contact [place] Please consult [email or web address] for contact info under   Time Spent with Patient: I have spent a total of 35 minutes with patient reviewing hospital notes, telemetry, EKGs, labs and examining the patient as well as establishing an assessment and plan that was discussed with the patient.  > 50% of time was spent in direct patient care.    Signed, Addison Naegeli. Audie Box, MD, [place]  08/26/2020 10:14 AM

## 2020-08-26 NOTE — Evaluation (Signed)
Physical Therapy Evaluation Patient Details Name: Alan [name] MRN: [id number] DOB: [birth year] Today's Date: 08/26/2020   History of Present Illness  Pt is a 80 y.o. male admitted 08/23/20 with worsening dyspnea, BLE edema; workup for acute on chronic CHF exacerbation. 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   Clinical Impression  Pt presents with an overall decrease in functional mobility secondary to above. PTA, pt independent, active, drives, lives alone. Today, pt moving well without DME, supervision for safety due to fall risk; pt notes significant improvement in DOE and activity tolerance. Pt would benefit from continued acute PT services to maximize functional mobility and independence prior to d/c home.   SpO2 97% on RA with ambulation HR 90s    Follow Up Recommendations No PT follow up    Equipment Recommendations  None recommended by PT    Recommendations for Other Services       Precautions / Restrictions Precautions Precautions: Fall Restrictions Weight Bearing Restrictions: No      Mobility  Bed Mobility               General bed mobility comments: Received sitting in recliner    Transfers Overall transfer level: Independent Equipment used: None                Ambulation/Gait Ambulation/Gait assistance: Supervision Gait Distance (Feet): 440 Feet Assistive device: None Gait Pattern/deviations: Step-through pattern;Decreased stride length;Decreased dorsiflexion - right Gait velocity: Decreased   General Gait Details: Slow, mostly steady gait without DME, supervision for safety; 2x standing rest breaks secondary to fatigue; no SOB noted, pt notes significant improvement in DOE; SpO2 97% on RA, HR 90s  Stairs            Wheelchair Mobility    Modified Rankin (Stroke Patients Only)       Balance Overall balance assessment: Needs assistance   Sitting balance-Leahy Scale: Good        Standing balance-Leahy Scale: Good                               Pertinent Vitals/Pain Pain Assessment: No/denies pain    Home Living Family/patient expects to be discharged to:: Private residence Living Arrangements: Alone Available Help at Discharge: Family;Friend(s);Available PRN/intermittently Type of Home: House Home Access: Stairs to enter Entrance Stairs-Rails: Can reach both Entrance Stairs-Number of Steps: 3 Home Layout: One level Home Equipment: Walker - 4 wheels;Walker - standard;Adaptive equipment;Grab bars - tub/shower;Shower seat;Bedside commode;Cane - single point      Prior Function Level of Independence: Independent         Comments: Independent without DME; intermittent use of SPC as needed; endorses falls; drives. 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 with baseline foot drop (pt wears special shoe)       Communication   Communication: HOH  Cognition Arousal/Alertness: Awake/alert Behavior During Therapy: WFL for tasks assessed/performed Overall Cognitive Status: Within Functional Limits for tasks assessed  General Comments      Exercises     Assessment/Plan    PT Assessment Patient needs continued PT services  PT Problem List Decreased activity tolerance;Decreased balance;Decreased mobility;Cardiopulmonary status limiting activity       PT Treatment Interventions Gait training;Stair training;Functional mobility training;Therapeutic activities;Therapeutic exercise;Balance training;Patient/family education     PT Goals (Current goals can be found in the Care Plan section)  Acute Rehab PT Goals Patient Stated Goal: Return home PT Goal Formulation: With patient Time For Goal Achievement: 09/09/20 Potential to Achieve Goals: Good    Frequency Min 3X/week   Barriers to discharge        Co-evaluation               AM-PAC PT "6 Clicks" Mobility  Outcome Measure Help needed turning from your back to your side while in a flat bed without using bedrails?: None Help needed moving from lying on your back to sitting on the side of a flat bed without using bedrails?: None Help needed moving to and from a bed to a chair (including a wheelchair)?: None Help needed standing up from a chair using your arms (e.g., wheelchair or bedside chair)?: None Help needed to walk in hospital room?: A Little Help needed climbing 3-5 steps with a railing? : A Little 6 Click Score: 22    End of Session   Activity Tolerance: Patient tolerated treatment well Patient left:  (standing with OT) Nurse Communication: Mobility status PT Visit Diagnosis: Other abnormalities of gait and mobility (R26.89)    Time: 5035-4656 PT Time Calculation (min) (ACUTE ONLY): 16 min   Charges:   PT Evaluation $PT Eval Low Complexity: [place], PT, DPT Acute Rehabilitation Services  Pager [phone] Office [phone]  Derry Lory 08/26/2020, 12:13 PM

## 2020-08-26 NOTE — Progress Notes (Signed)
PROGRESS NOTE    Alan [name]  ASN:[id number] DOB: Jun 18, 1940 DOA: 08/23/2020 PCP: [name], MD    Brief Narrative:  80 y.o. male with medical history significant for childhood polio, right SI fusion (07/20/20), chronic anemia, paroxysmal A. fib on Eliquis, hypertension, type 2 diabetes, GERD, who presented initially to [place] ED at the recommendation of his pulmonologist due to progressively worsening nonexertional dyspnea.  Associated with lower extremity edema, elevated JVD, elevated BNP with bnp of 246.  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   CKD 3B Appears to be at his baseline creatinine Monitor urine output Avoid nephrotoxic agents and hypotension.  HTN BP stable at present Continue norvasc,  coreg, losartan    DVT prophylaxis: Lovenox subq Code Status: Full Family Communication: Pt in room, family not at bedside  Status is: Inpatient  Remains inpatient appropriate because:Inpatient level of care appropriate due to severity of illness  Dispo: The patient is from: Home              Anticipated d/c is to: Home              Patient currently is not medically stable to d/c.   Difficult to place patient No       Consultants:  Cardiology  Procedures:    Antimicrobials: Anti-infectives (From admission, onward)    None       Subjective: Today, patient denies any new complaints, denies any chest pain, worsening shortness of breath, nausea/vomiting, fever/chills.  Objective: Vitals:   08/26/20 1131 08/26/20 1345 08/26/20 1415 08/26/20 1500  BP: 124/62 136/65 (!) 124/55 (!) 123/56  Pulse: 66 75 63 60  Resp: 19  14   Temp: 97.6 F (36.4 C) 97.8 F (36.6 C) 98.5 F (36.9 C)   TempSrc: Oral Oral Oral   SpO2: 100% 97% 99%   Weight:      Height:        Intake/Output Summary (Last 24 hours) at 08/26/2020 1709 Last data filed at 08/26/2020 1614 Gross per 24 hour  Intake 1807.33 ml  Output 4000 ml  Net -2192.67 ml   Filed Weights   08/23/20 1823 08/25/20 0300 08/26/20 0556  Weight: 102.1 kg 103.1 kg 99 kg    Examination: General: NAD  Cardiovascular: S1, S2 present Respiratory: CTAB Abdomen: Soft, nontender,  nondistended, bowel sounds present Musculoskeletal: + bilateral pedal edema noted Skin: Normal Psychiatry: Normal mood    Data Reviewed: I have personally reviewed following labs and imaging studies  CBC: Recent Labs  Lab 08/23/20 1827 08/24/20 0619 08/26/20 0413  WBC 4.8 5.6 4.6  NEUTROABS  --   --  2.8  HGB 7.5* 7.8* 7.1*  HCT 24.6* 24.9* 23.0*  MCV 88.8 88.0 87.5  PLT 133* 147* 902*   Basic Metabolic Panel: Recent Labs  Lab 08/23/20 1827 08/24/20 0619 08/25/20 0256 08/26/20 0413  NA 136  --  133* 134*  K 4.2  --  4.1 3.8   CL 106  --  102 99  CO2 20*  --  23 24  GLUCOSE 111*  --  147* 130*  BUN 29*  --  28* 31*  CREATININE 1.85* 1.94* 2.06* 2.30*  CALCIUM 8.3*  --  8.7* 8.8*  MG  --  2.2  --   --   PHOS  --  3.4  --   --    GFR: Estimated Creatinine Clearance: 30.7 mL/min (A) (by C-G formula based on SCr of 2.3 mg/dL (H)). Liver Function Tests: Recent Labs  Lab 08/23/20 1827  AST 19  ALT 19  ALKPHOS 77  BILITOT 0.4  PROT 7.0  ALBUMIN 3.7   No results for input(s): LIPASE, AMYLASE in the last 168 hours. No results for input(s): AMMONIA in the last 168 hours. Coagulation Profile: No results for input(s): INR, PROTIME in the last 168 hours. Cardiac Enzymes: No results for input(s): CKTOTAL, CKMB, CKMBINDEX, TROPONINI in the last 168 hours. BNP (last 3 results) No results for input(s): PROBNP in the last 8760 hours. HbA1C: Recent Labs    08/24/20 1420  HGBA1C 6.4*   CBG: Recent Labs  Lab 08/25/20 1643 08/25/20 2045 08/26/20 0536 08/26/20 1132 08/26/20 1613  GLUCAP 154* 174* 124* 159* 191*   Lipid Profile: No results for input(s): CHOL, HDL, LDLCALC, TRIG, CHOLHDL, LDLDIRECT in the last 72 hours. Thyroid Function Tests: Recent Labs    08/25/20 0256  TSH 0.879   Anemia Panel: Recent Labs    08/26/20 0825  VITAMINB12 591  FOLATE 27.8  FERRITIN 14*  TIBC 449  IRON 24*   Sepsis Labs: No results for input(s): PROCALCITON, LATICACIDVEN in the last 168 hours.  Recent Results (from the past 240 hour(s))  Resp Panel by RT-PCR (Flu A&B, Covid) Nasopharyngeal Swab     Status: None   Collection Time: 08/23/20 10:40 PM   Specimen: Nasopharyngeal Swab; Nasopharyngeal(NP) swabs in vial transport medium  Result Value Ref Range Status   SARS Coronavirus 2 by RT PCR NEGATIVE NEGATIVE Final    Comment: (NOTE) SARS-CoV-2 target nucleic acids are NOT DETECTED.  The SARS-CoV-2 RNA is generally detectable in upper respiratory specimens during the acute phase of infection. The  lowest concentration of SARS-CoV-2 viral copies this assay can detect is 138 copies/mL. A negative result does not preclude SARS-Cov-2 infection and should not be used as the sole basis for treatment or other patient management decisions. A negative result may occur with  improper specimen collection/handling, submission of specimen other than nasopharyngeal swab, presence of viral mutation(s) within the areas targeted by this assay, and inadequate number of viral copies(<138 copies/mL). A negative result must be combined with clinical observations, patient history, and epidemiological information. The expected result is Negative.  Fact Sheet for Patients:  EntrepreneurPulse.com.au  Fact Sheet for Healthcare Providers:  IncredibleEmployment.be  This test is no t  yet approved or cleared by the [place] and  has been authorized for detection and/or diagnosis of SARS-CoV-2 by FDA under an Emergency Use Authorization (EUA). This EUA will remain  in effect (meaning this test can be used) for the duration of the COVID-19 declaration under Section 564(b)(1) of the Act, 21 U.S.C.section 360bbb-3(b)(1), unless the authorization is terminated  or revoked sooner.       Influenza A by PCR NEGATIVE NEGATIVE Final   Influenza B by PCR NEGATIVE NEGATIVE Final    Comment: (NOTE) The Xpert Xpress SARS-CoV-2/FLU/RSV plus assay is intended as an aid in the diagnosis of influenza from Nasopharyngeal swab specimens and should not be used as a sole basis for treatment. Nasal washings and aspirates are unacceptable for Xpert Xpress SARS-CoV-2/FLU/RSV testing.  Fact Sheet for Patients: EntrepreneurPulse.com.au  Fact Sheet for Healthcare Providers: IncredibleEmployment.be  This test is not yet approved or cleared by the [place] FDA and has been authorized for detection and/or diagnosis of SARS-CoV-2 by FDA under  an Emergency Use Authorization (EUA). This EUA will remain in effect (meaning this test can be used) for the duration of the COVID-19 declaration under Section 564(b)(1) of the Act, 21 U.S.C. section 360bbb-3(b)(1), unless the authorization is terminated or revoked.  Performed at [facility], [address] [place], [place] [address]      Radiology Studies: No results found.  Scheduled Meds:  amiodarone  200 mg Oral Daily   amLODipine  10 mg Oral Daily   carvedilol  25 mg Oral BID WC   enoxaparin (LOVENOX) injection  40 mg Subcutaneous Q24H   furosemide  40 mg Intravenous BID   insulin aspart  0-5 Units Subcutaneous QHS   insulin aspart  0-9 Units Subcutaneous TID WC   losartan  100 mg Oral Daily   multivitamin with minerals  1 tablet Oral Daily   pantoprazole  40 mg Oral BID   Continuous Infusions:   LOS: 2 days   Alma Friendly, MD [facility] Pager On Amion  If 7PM-7AM, please contact night-coverage 08/26/2020, 5:09 PM

## 2020-08-26 NOTE — Therapy (Signed)
Occupational Therapy Evaluation Patient Details Name: Alan [name] MRN: [id number] DOB: Feb 26, 1940 Today's Date: 08/26/2020    History of Present Illness Pt is a 80 y.o. male admitted 08/23/20 with worsening dyspnea, BLE edema; workup for acute on chronic CHF exacerbation. 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   Clinical Impression   Pt admitted as above. Pt is currently at baseline level of function for OT related to ADL/self care, including functional mobility, transfers within room, shower transfer, toilet transfer & grooming simulated standing at sink. Pt was also noted to be Mod I don/doffing socks while seated in chair. Pt has DME, grab bars and some LH adaptive equipment at home. He denies any further needs from acute OT at this time. Will sign off.    Follow Up Recommendations     None  Equipment Recommendations     None recommended  Recommendations for Other Services   None recommended    Precautions / Restrictions Precautions Precautions: Fall Restrictions Weight Bearing Restrictions: No      Mobility Bed Mobility               General bed mobility comments: Pt received standing in hallway with PT. Agreed to amb back into room w/ OT for assessment    Transfers Overall transfer level: Independent Equipment used: None                  Balance Overall balance assessment: Needs assistance   Sitting balance-Leahy Scale: Good       Standing balance-Leahy Scale: Good                             ADL either performed or assessed with clinical judgement   ADL Overall ADL's : Modified independent;At baseline (Pt was obeserved to perform LB dressing, functional mobiilty and transfers in room, bed, chair, toilet, stand at sink for grooming, shower transfer etc. Pt has necessary DME, and LH A/E at home. He states that he is currently at/close to baseline level.)                         General ADL Comments: Pt is currently at/close to baseline level, has DME, denies further acute OT needs at this time.     Vision Baseline Vision/History: Wears glasses Wears Glasses: Reading only Patient Visual Report: No change from baseline              Pertinent Vitals/Pain Pain Assessment: No/denies pain     Hand Dominance Right   Extremity/Trunk Assessment Upper Extremity Assessment Upper Extremity Assessment: Overall WFL for tasks assessed   Lower Extremity Assessment Lower Extremity Assessment: Generalized weakness;RLE deficits/detail (Per PT eval and observation during assessemtn) RLE Deficits / Details: h/o R ankle injury from childhood polio with baseline foot drop (pt wears special shoe)   Cervical / Trunk Assessment Cervical / Trunk Assessment: Normal   Communication Communication Communication: HOH   Cognition Arousal/Alertness: Awake/alert Behavior During Therapy: WFL for tasks assessed/performed Overall Cognitive Status: Within Functional Limits for tasks assessed                    General Comments    Pt has necessary DME and a/e at home. Denies needs at this time.           Home Living Family/patient expects to be discharged to:: Private residence Living  Arrangements: Alone Available Help at Discharge: Family;Friend(s);Available PRN/intermittently Type of Home: House Home Access: Stairs to enter [employer] of Steps: 3 Entrance Stairs-Rails: Can reach both Home Layout: One level     Bathroom Shower/Tub: Occupational psychologist: Standard Bathroom Accessibility: Yes   Home Equipment: Environmental consultant - 4 wheels;Walker - standard;Adaptive equipment;Grab bars - tub/shower;Shower seat;Bedside commode;Cane - single point Adaptive Equipment: Reacher        Prior Functioning/Environment Level of Independence: Independent        Comments: Independent without DME; intermittent use of SPC as needed; endorses falls;  drives. 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 [place]:        OT Treatment/Interventions:      OT Goals(Current goals can be found in the care plan section) Acute Rehab OT Goals Patient Stated Goal: Go home tomorrow OT Goal Formulation: All assessment and education complete, DC therapy Time For Goal Achievement: 08/26/20 Potential to Achieve Goals: Good   OT Frequency:   D/c OT at this time        AM-PAC OT "6 Clicks" Daily Activity     Outcome Measure Help from another person eating meals?: None Help from another person taking care of personal grooming?: None Help from another person toileting, which includes using toliet, bedpan, or urinal?: None Help from another person bathing (including washing, rinsing, drying)?: A Little (Pt is Mod I using shower chair & grab bars during assessment) Help from another person to put on and taking off regular upper body clothing?: None Help from another person to put on and taking off regular lower body clothing?: None 6 Click Score: 23   End of Session Equipment Utilized During Treatment:  (None) Nurse Communication: Mobility status;Other (comment) (No further acute OT needs, will sign off.)  Activity Tolerance: Patient tolerated treatment well Patient left: in chair;with call bell/phone within reach;Other (comment) (w/ lunch tray, eating independently.)                   Time: 1150-1208 OT Time Calculation (min): 18 min Charges:  OT General Charges $OT Visit: 1 Visit OT Evaluation $OT Eval Low Complexity: 1 Low  Ellionna Buckbee Beth Dixon, OTR/L 08/26/2020, 1:17 PM

## 2020-08-27 DIAGNOSIS — I25718 Atherosclerosis of autologous vein coronary artery bypass graft(s) with other forms of angina pectoris: Secondary | ICD-10-CM | POA: Diagnosis not present

## 2020-08-27 DIAGNOSIS — I4819 Other persistent atrial fibrillation: Secondary | ICD-10-CM | POA: Diagnosis not present

## 2020-08-27 DIAGNOSIS — E669 Obesity, unspecified: Secondary | ICD-10-CM

## 2020-08-27 DIAGNOSIS — E1169 Type 2 diabetes mellitus with other specified complication: Secondary | ICD-10-CM | POA: Diagnosis not present

## 2020-08-27 DIAGNOSIS — I5033 Acute on chronic diastolic (congestive) heart failure: Secondary | ICD-10-CM | POA: Diagnosis not present

## 2020-08-27 LAB — TYPE AND SCREEN
ABO/RH(D): A POS
Antibody Screen: NEGATIVE
Unit division: 0

## 2020-08-27 LAB — CBC WITH DIFFERENTIAL/PLATELET
Abs Immature Granulocytes: 0.01 10*3/uL (ref 0.00–0.07)
Basophils Absolute: 0.1 10*3/uL (ref 0.0–0.1)
Basophils Relative: 1 %
Eosinophils Absolute: 0.2 10*3/uL (ref 0.0–0.5)
Eosinophils Relative: 3 %
HCT: 25.5 % — ABNORMAL LOW (ref 39.0–52.0)
Hemoglobin: 8.1 g/dL — ABNORMAL LOW (ref 13.0–17.0)
Immature Granulocytes: 0 %
Lymphocytes Relative: 16 %
Lymphs Abs: 0.9 10*3/uL (ref 0.7–4.0)
MCH: 27.4 pg (ref 26.0–34.0)
MCHC: 31.8 g/dL (ref 30.0–36.0)
MCV: 86.1 fL (ref 80.0–100.0)
Monocytes Absolute: 0.9 10*3/uL (ref 0.1–1.0)
Monocytes Relative: 16 %
Neutro Abs: 3.3 10*3/uL (ref 1.7–7.7)
Neutrophils Relative %: 64 %
Platelets: 140 10*3/uL — ABNORMAL LOW (ref 150–400)
RBC: 2.96 MIL/uL — ABNORMAL LOW (ref 4.22–5.81)
RDW: 15 % (ref 11.5–15.5)
WBC: 5.3 10*3/uL (ref 4.0–10.5)
nRBC: 0 % (ref 0.0–0.2)

## 2020-08-27 LAB — BPAM RBC
Blood Product Expiration Date: 202208132359
ISSUE DATE / TIME: 202207211403
Unit Type and Rh: 6200

## 2020-08-27 LAB — BASIC METABOLIC PANEL
Anion gap: 7 (ref 5–15)
BUN: 34 mg/dL — ABNORMAL HIGH (ref 8–23)
CO2: 26 mmol/L (ref 22–32)
Calcium: 8.8 mg/dL — ABNORMAL LOW (ref 8.9–10.3)
Chloride: 101 mmol/L (ref 98–111)
Creatinine, Ser: 2.37 mg/dL — ABNORMAL HIGH (ref 0.61–1.24)
GFR, Estimated: 27 mL/min — ABNORMAL LOW (ref >60)
Glucose, Bld: 147 mg/dL — ABNORMAL HIGH (ref 70–99)
Potassium: 3.8 mmol/L (ref 3.5–5.1)
Sodium: 134 mmol/L — ABNORMAL LOW (ref 135–145)

## 2020-08-27 LAB — GLUCOSE, CAPILLARY
Glucose-Capillary: 134 mg/dL — ABNORMAL HIGH (ref 70–99)
Glucose-Capillary: 172 mg/dL — ABNORMAL HIGH (ref 70–99)

## 2020-08-27 MED ORDER — ENOXAPARIN SODIUM 30 MG/0.3ML IJ SOSY: 30.0000 mg | PREFILLED_SYRINGE | INTRAMUSCULAR | Status: DC

## 2020-08-27 MED ORDER — FERROUS SULFATE 325 (65 FE) MG PO TABS: 325.0000 mg | ORAL_TABLET | Freq: Every day | ORAL | 0 refills | Status: DC

## 2020-08-27 MED ORDER — CARVEDILOL 25 MG PO TABS: 25.0000 mg | ORAL_TABLET | Freq: Two times a day (BID) | ORAL | 3 refills | Status: DC

## 2020-08-27 MED ORDER — TORSEMIDE 20 MG PO TABS: 20.0000 mg | ORAL_TABLET | Freq: Every day | ORAL | 0 refills | Status: DC

## 2020-08-27 MED ORDER — TORSEMIDE 20 MG PO TABS
20.0000 mg | ORAL_TABLET | Freq: Every day | ORAL | Status: DC
  Administered 2020-08-27: 20 mg via ORAL
  Filled 2020-08-27: qty 1

## 2020-08-27 MED ORDER — PANTOPRAZOLE SODIUM 40 MG PO TBEC: 40.0000 mg | DELAYED_RELEASE_TABLET | Freq: Two times a day (BID) | ORAL | 0 refills | Status: DC

## 2020-08-27 NOTE — Discharge Summary (Signed)
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    Discharge Condition: Stable  Diet recommendation: Heart healthy, moderate carb  Vitals:   08/27/20 0831 08/27/20 1115  BP: 136/67 (!) 130/57  Pulse: 66 64  Resp:  19  Temp: 98.1 F (36.7 C) 98.2 F (36.8 C)  SpO2: 98% 99%    History of present illness:  80 y.o. male with medical history significant for childhood polio, right SI fusion (07/20/20), chronic anemia, paroxysmal A. fib on Eliquis, hypertension, type 2 diabetes, GERD, who presented initially to [facility] ED at the recommendation of his pulmonologist due to progressively worsening nonexertional dyspnea.  Associated with lower extremity edema, elevated JVD, elevated BNP with bnp of 246.  Patient was subsequently transferred to [facility] for further evaluation and management for presumed exacerbation of CHF.   Today, patient reported feeling much better, denies any shortness of breath, chest pain, abdominal pain, nausea/vomiting, fever/chills.  Patient stable to discharge with follow-up with PCP, cardiology and gastroenterologist.  Hospital Course:  Principal Problem:   CHF exacerbation ([place]) Active Problems:   Diabetes mellitus type 2 in obese (Alan [name])   Persistent atrial fibrillation ([place])   CAD s/p redo CABG 2004 with stable angina  pectoris (Alan [name])   Acute on chronic diastolic CHF 2d echo reviewed with findings suggestive of RV volume overload, normal LVEF Cardiology consulted, appreciate recs S/p diuresis with -9L Cont home coreg, losartan Outpatient cardiology follow-up   Persistent A. 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   CKD 3B Baseline creatinine 1.9, slight bump Outpatient follow-up closely  HTN BP stable at present Continue norvasc, coreg dose adjusted to 25 mg twice daily, losartan  Obesity Lifestyle modification advised      Estimated body mass index is 31.81 kg/m as calculated from the following:   Height as of this encounter: 5\' 9"  (9.833 m).   Weight as of this encounter: 97.7 kg.    Procedures: None  Consultations: Cardiology  Discharge Exam: BP (!) 130/57 (BP Location: Left Arm)   Pulse 64   Temp 98.2 F (36.8 C) (Oral)   Resp 19   Ht 5\' 9"  (1.753 m)   Wt 97.7 kg   SpO2 99%   BMI 31.81 kg/m   General: NAD  Cardiovascular: S1, S2 present Respiratory: CTAB Abdomen: Soft, nontender, nondistended, bowel sounds present Musculoskeletal: No bilateral pedal edema noted  Skin: Normal Psychiatry: Normal mood    Discharge Instructions You were cared for by a hospitalist during your hospital stay. If you have any questions about your discharge medications or the care you received while you were in the hospital after you are discharged, you can call the unit  and asked to speak with the hospitalist on call if the hospitalist that took care of you is not available. Once you are discharged, your primary care physician will handle any further medical issues. Please note that NO REFILLS for any discharge medications will be authorized once you are discharged, as it is imperative that you return to your primary care physician (or establish a relationship with a primary care physician if you do not have one) for your aftercare needs so that they can reassess your need for medications and monitor your lab values.  Discharge Instructions     Amb Referral to HF Clinic   Complete by: As directed    Diet - low sodium heart healthy   Complete by: As directed    Increase activity slowly   Complete by: As directed       Allergies as of 08/27/2020   No Known Allergies      Medication List     STOP taking these medications    fluticasone 50 MCG/ACT nasal spray Commonly known as: FLONASE   furosemide 40 MG tablet Commonly known as: LASIX   methocarbamol 500 MG tablet Commonly known as: ROBAXIN       TAKE these medications    amiodarone 200 MG tablet Commonly known as: PACERONE Take 200 mg by mouth daily.   amLODipine 10 MG tablet Commonly known as: NORVASC Take 10 mg by mouth daily.   carvedilol 25 MG tablet Commonly known as: COREG Take 1 tablet (25 mg total) by mouth 2 (two) times daily with a meal. What changed: See the new instructions.   ferrous sulfate 325 (65 FE) MG tablet Take 1 tablet (325 mg total) by mouth daily with breakfast. Start taking on: August 28, 2020   glipiZIDE 10 MG tablet Commonly known as: GLUCOTROL Take 10 mg by mouth 2 (two) times daily.   HYDROcodone-acetaminophen 10-325 MG tablet Commonly known as: NORCO Take 1 tablet by mouth every 4 (four) hours as needed for moderate pain ((score 4 to 6)).   insulin lispro 100 UNIT/ML injection Commonly known as: HUMALOG Inject 2-8 Units into the skin as needed  for high blood sugar.   losartan 100 MG tablet Commonly known as: COZAAR Take 100 mg by mouth daily.   metFORMIN 500 MG tablet Commonly known as: GLUCOPHAGE Take 500 mg by mouth 2 (two) times daily with a meal. Takes with 500mg  XR   metFORMIN 500 MG 24 hr tablet Commonly known as: GLUCOPHAGE-XR Take 500 mg by mouth 2 (two) times daily.   multivitamin with minerals Tabs tablet Take 1 tablet by mouth daily.   pantoprazole 40 MG tablet Commonly known as: PROTONIX Take 1 tablet (40 mg total) by mouth 2 (two) times daily.   Potassium Chloride ER 20 MEQ Tbcr Take 1 tablet by mouth once daily What changed: how much to take   torsemide 20 MG tablet Commonly known as: DEMADEX Take 1 tablet (20 mg total) by mouth daily. Start taking on: August 28, 2020       No Known Allergies  Follow-up Information     Alan Noon, MD. Go on 09/02/2020.   Specialty: Family Medicine Why: @12 :00 [name] Contact information:  [place] [address] [phone]         Sanda Klein, MD .   Specialty: Cardiology Contact information: [address] [place] [address] [phone]                  The results of significant diagnostics from this hospitalization (including imaging, microbiology, ancillary and laboratory) are listed below for reference.    Significant Diagnostic Studies: DG Chest 2 View  Result Date: 08/23/2020 CLINICAL DATA:  Shortness of breath. EXAM: CHEST - 2 VIEW COMPARISON:  Chest x-ray 03/10/2020, CT chest 10/15/2009 FINDINGS: Left chest wall cardiac pacemaker with 3 leads in similar position. The heart size and mediastinal contours are unchanged. Aortic calcification. Cardiac surgical changes overlie the mediastinum. Coronary artery stent. No focal consolidation. No pulmonary edema. Interval development of a trace right pleural effusion. Possible trace left pleural effusion. No pneumothorax. No acute osseous abnormality.  IMPRESSION: Interval development of a trace right pleural effusion. Possible trace left pleural effusion. Electronically Signed   By: Iven Finn M.D.   On: 08/23/2020 18:53   ECHOCARDIOGRAM COMPLETE  Result Date: 08/24/2020    ECHOCARDIOGRAM REPORT   Patient Name:   Alan [name] Date of Exam:    08/24/2020 Medical Rec #:  [id number]     [place]099Height: 69.0 in Accession #:    [id number]    Weight:          225.0 lb Date of Birth:  [birth year]    BSA:             2.172 m Patient Age:    80 years      BP:              149/65 mmHg Patient Gender: M             HR:              63 bpm. Exam Location:  Inpatient Procedure: 2D Echo, Cardiac Doppler and Color Doppler Indications:    CHF-Acute Diastolic  History:        Patient has prior history of Echocardiogram examinations, most                 recent 01/15/2019. Prior CABG and Pacemaker, Arrythmias:Atrial                 Fibrillation, Signs/Symptoms:Shortness of Breath; Risk                 Factors:Hypertension, Diabetes, Dyslipidemia and Sleep Apnea.                 CKD.  Sonographer:    Clayton Lefort RDCS (AE) Referring Phys: [id number] [place]  1. Left ventricular ejection fraction, by estimation, is 50 to 55%. The left ventricle has low normal function. The left ventricle has no regional wall motion abnormalities. The left ventricular internal cavity size was mildly dilated. There is mild concentric left ventricular hypertrophy. Indeterminate diastolic filling due to E-A fusion. There is the interventricular septum is flattened in diastole ('D' shaped left ventricle), consistent with right ventricular volume overload.  2. Right ventricular systolic function is moderately reduced. The right ventricular size is severely enlarged. There is mildly elevated pulmonary artery systolic pressure.  3. Left atrial size was mild to moderately dilated.  4. Right atrial size was severely dilated.  5. The mitral valve is normal in structure. Mild to moderate  mitral valve regurgitation.  6. Tricuspid valve regurgitation  is moderate.  7. The aortic valve is tricuspid. Aortic valve regurgitation is not visualized.  8. The inferior vena cava is dilated in size with >50% respiratory variability, suggesting right atrial pressure of 8 mmHg. Comparison(s): Prior images reviewed side by side. The left ventricular function is unchanged. The right ventricular hypertrophy is unchanged. (the right ventricle was dilated and had moderately reduced function on the previous study as well. The right ventricular systolic function is worse. Tricuspid insufficency is worse and there is evidence of increased right atrial pressure. FINDINGS  Left Ventricle: Left ventricular ejection fraction, by estimation, is 50 to 55%. The left ventricle has low normal function. The left ventricle has no regional wall motion abnormalities. The left ventricular internal cavity size was mildly dilated. There is mild concentric left ventricular hypertrophy. Abnormal (paradoxical) septal motion, consistent with RV pacemaker and the interventricular septum is flattened in diastole ('D' shaped left ventricle), consistent with right ventricular volume overload. Left ventricular diastolic function could not be evaluated due to atrial fibrillation. Indeterminate diastolic filling due to E-A fusion. Right Ventricle: The right ventricular size is severely enlarged. No increase in right ventricular wall thickness. Right ventricular systolic function is moderately reduced. There is mildly elevated pulmonary artery systolic pressure. The tricuspid regurgitant velocity is 2.50 m/s, and with an assumed right atrial pressure of 8 mmHg, the estimated right ventricular systolic pressure is 23.5 mmHg. Left Atrium: Left atrial size was mild to moderately dilated. Right Atrium: Right atrial size was severely dilated. Pericardium: There is no evidence of pericardial effusion. Mitral Valve: The mitral valve is normal in structure.  Mild mitral annular calcification. Mild to moderate mitral valve regurgitation, with centrally-directed jet. MV peak gradient, 5.2 mmHg. The mean mitral valve gradient is 1.0 mmHg. Tricuspid Valve: The tricuspid valve is normal in structure. Tricuspid valve regurgitation is moderate. Aortic Valve: The aortic valve is tricuspid. Aortic valve regurgitation is not visualized. Aortic valve mean gradient measures 3.0 mmHg. Aortic valve peak gradient measures 5.7 mmHg. Aortic valve area, by VTI measures 3.25 cm. Pulmonic Valve: The pulmonic valve was normal in structure. Pulmonic valve regurgitation is trivial. Aorta: The aortic root and ascending aorta are structurally normal, with no evidence of dilitation. Venous: The inferior vena cava is dilated in size with greater than 50% respiratory variability, suggesting right atrial pressure of 8 mmHg. IAS/Shunts: No atrial level shunt detected by color flow Doppler. Additional Comments: A device lead is visualized in the right ventricle.  LEFT VENTRICLE PLAX 2D LVIDd:         5.90 cm LVIDs:         3.90 cm LV PW:         1.50 cm LV IVS:        1.40 cm LVOT diam:     2.20 cm LV SV:         79 LV SV Index:   36 LVOT Area:     3.80 cm  RIGHT VENTRICLE            IVC RV Basal diam:  4.30 cm    IVC diam: 2.70 cm RV Mid diam:    4.00 cm RV S prime:     6.73 cm/s TAPSE (M-mode): 0.8 cm LEFT ATRIUM             Index       RIGHT ATRIUM           Index LA diam:        4.10 cm 1.89  cm/m  RA Area:     34.30 cm LA Vol (A2C):   85.9 ml 39.55 ml/m RA Volume:   136.00 ml 62.61 ml/m LA Vol (A4C):   63.8 ml 29.37 ml/m LA Biplane Vol: 75.6 ml 34.81 ml/m  AORTIC VALVE AV Area (Vmax):    3.19 cm AV Area (Vmean):   3.15 cm AV Area (VTI):     3.25 cm AV Vmax:           119.00 cm/s AV Vmean:          78.700 cm/s AV VTI:            0.243 m AV Peak Grad:      5.7 mmHg AV Mean Grad:      3.0 mmHg LVOT Vmax:         100.00 cm/s LVOT Vmean:        65.200 cm/s LVOT VTI:          0.208 m LVOT/AV  VTI ratio: 0.86  AORTA Ao Root diam: 3.70 cm Ao Asc diam:  3.50 cm MITRAL VALVE             TRICUSPID VALVE MV Area (PHT): 2.24 cm  TR Peak grad:   25.0 mmHg MV Area VTI:   2.79 cm  TR Vmax:        250.00 cm/s MV Peak grad:  5.2 mmHg MV Mean grad:  1.0 mmHg  SHUNTS MV Vmax:       1.14 m/s  Systemic VTI:  0.21 m MV Vmean:      54.9 cm/s Systemic Diam: 2.20 cm Dani Gobble [name] MD Electronically signed by Sanda Klein MD Signature Date/Time: 08/24/2020/11:00:05 AM    Final     Microbiology: Recent Results (from the past 240 hour(s))  Resp Panel by RT-PCR (Flu A&B, Covid) Nasopharyngeal Swab     Status: None   Collection Time: 08/23/20 10:40 PM   Specimen: Nasopharyngeal Swab; Nasopharyngeal(NP) swabs in vial transport medium  Result Value Ref Range Status   SARS Coronavirus 2 by RT PCR NEGATIVE NEGATIVE Final    Comment: (NOTE) SARS-CoV-2 target nucleic acids are NOT DETECTED.  The SARS-CoV-2 RNA is generally detectable in upper respiratory specimens during the acute phase of infection. The lowest concentration of SARS-CoV-2 viral copies this assay can detect is 138 copies/mL. A negative result does not preclude SARS-Cov-2 infection and should not be used as the sole basis for treatment or other patient management decisions. A negative result may occur with  improper specimen collection/handling, submission of specimen other than nasopharyngeal swab, presence of viral mutation(s) within the areas targeted by this assay, and inadequate number of viral copies(<138 copies/mL). A negative result must be combined with clinical observations, patient history, and epidemiological information. The expected result is Negative.  Fact Sheet for Patients:  EntrepreneurPulse.com.au  Fact Sheet for Healthcare Providers:  IncredibleEmployment.be  This test is no t yet approved or cleared by the [place] FDA and  has been authorized for detection and/or diagnosis  of SARS-CoV-2 by FDA under an Emergency Use Authorization (EUA). This EUA will remain  in effect (meaning this test can be used) for the duration of the COVID-19 declaration under Section 564(b)(1) of the Act, 21 U.S.C.section 360bbb-3(b)(1), unless the authorization is terminated  or revoked sooner.       Influenza A by PCR NEGATIVE NEGATIVE Final   Influenza B by PCR NEGATIVE NEGATIVE Final    Comment: (NOTE) The Xpert Xpress SARS-CoV-2/FLU/RSV plus assay is  intended as an aid in the diagnosis of influenza from Nasopharyngeal swab specimens and should not be used as a sole basis for treatment. Nasal washings and aspirates are unacceptable for Xpert Xpress SARS-CoV-2/FLU/RSV testing.  Fact Sheet for Patients: EntrepreneurPulse.com.au  Fact Sheet for Healthcare Providers: IncredibleEmployment.be  This test is not yet approved or cleared by the [place] FDA and has been authorized for detection and/or diagnosis of SARS-CoV-2 by FDA under an Emergency Use Authorization (EUA). This EUA will remain in effect (meaning this test can be used) for the duration of the COVID-19 declaration under Section 564(b)(1) of the Act, 21 U.S.C. section 360bbb-3(b)(1), unless the authorization is terminated or revoked.  Performed at [facility], [address], [place], [place] [address]      Labs: Basic Metabolic Panel: Recent Labs  Lab 08/23/20 1827 08/24/20 0619 08/25/20 0256 08/26/20 0413 08/27/20 0338  NA 136  --  133* 134* 134*  K 4.2  --  4.1 3.8 3.8  CL 106  --  102 99 101  CO2 20*  --  23 24 26   GLUCOSE 111*  --  147* 130* 147*  BUN 29*  --  28* 31* 34*  CREATININE 1.85* 1.94* 2.06* 2.30* 2.37*  CALCIUM 8.3*  --  8.7* 8.8* 8.8*  MG  --  2.2  --   --   --   PHOS  --  3.4  --   --   --    Liver Function Tests: Recent Labs  Lab 08/23/20 1827  AST 19  ALT 19  ALKPHOS 77  BILITOT 0.4  PROT 7.0  ALBUMIN 3.7    No results for input(s): LIPASE, AMYLASE in the last 168 hours. No results for input(s): AMMONIA in the last 168 hours. CBC: Recent Labs  Lab 08/23/20 1827 08/24/20 0619 08/26/20 0413 08/26/20 1941 08/27/20 0338  WBC 4.8 5.6 4.6  --  5.3  NEUTROABS  --   --  2.8  --  3.3  HGB 7.5* 7.8* 7.1* 8.6* 8.1*  HCT 24.6* 24.9* 23.0* 27.4* 25.5*  MCV 88.8 88.0 87.5  --  86.1  PLT 133* 147* 134*  --  140*   Cardiac Enzymes: No results for input(s): CKTOTAL, CKMB, CKMBINDEX, TROPONINI in the last 168 hours. BNP: BNP (last 3 results) Recent Labs    08/23/20 2223  BNP 246.8*    ProBNP (last 3 results) No results for input(s): PROBNP in the last 8760 hours.  CBG: Recent Labs  Lab 08/26/20 1132 08/26/20 1613 08/26/20 2122 08/27/20 0555 08/27/20 1116  GLUCAP 159* 191* 185* 134* 172*       Signed:  Alma Friendly, MD [facility] 08/27/2020, 11:50 AM

## 2020-08-27 NOTE — Progress Notes (Signed)
Patient refuses CPAP 

## 2020-08-27 NOTE — TOC Transition Note (Signed)
Transition of Care [facility]) - CM/SW Discharge Note   Patient Details  Name: Alan [name] MRN: [id number] Date of Birth: 06-Nov-1940  [facility]) CM/SW Contact:  [name] Mayo, RN Phone Number: 08/27/2020, 11:57 AM   Clinical Narrative:    Patient is for dc home today, he has scale and bp cuff at home. He drives him self to the MD apts, he eats a low sodium diet. He has no needs.    Final next level of care: Home/Self Care Barriers to Discharge: No Barriers Identified   Patient Goals and CMS Choice Patient states their goals for this hospitalization and ongoing recovery are:: return home   Choice offered to / list presented to : NA  Discharge Placement                       Discharge Plan and Services                  DME Agency: NA                  Social Determinants of Health (SDOH) Interventions     Readmission Risk Interventions No flowsheet data found.

## 2020-08-27 NOTE — Progress Notes (Addendum)
Physical Therapy Treatment & Discharge Patient Details Name: Alan [name] MRN: [id number] DOB: April 20, 1940 Today's Date: 08/27/2020    History of Present Illness Pt is a 80 y.o. male admitted 08/23/20 with worsening dyspnea, BLE edema; workup for acute on chronic CHF exacerbation. 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   PT Comments    Pt has progressed well with mobility. Pt independent with ADL and mobility tasks, including stair training this session. Pt has met short-term acute PT goals; pt reports no further questions or concerns. Reviewed educ re: activity recommendations. Will d/c acute PT.  SpO2 98% on RA, HR 76    Follow Up Recommendations  No PT follow up     Equipment Recommendations  None recommended by PT    Recommendations for Other Services       Precautions / Restrictions Precautions Precautions: Fall Restrictions Weight Bearing Restrictions: No    Mobility  Bed Mobility               General bed mobility comments: Received sitting in recliner    Transfers Overall transfer level: Independent Equipment used: None                Ambulation/Gait Ambulation/Gait assistance: Independent Gait Distance (Feet): 500 Feet Assistive device: None Gait Pattern/deviations: Step-through pattern;Decreased stride length;Decreased dorsiflexion - right Gait velocity: Decreased   General Gait Details: Slow, mostly steady gait, independent without DME; 2x standing rest breaks secondary to fatigue; no SOB noted   Stairs Stairs: Yes Stairs assistance: Modified independent (Device/Increase time) Stair Management: One rail Left;Alternating pattern;Step to pattern;Forwards Number of Stairs: 10 General stair comments: Ascend/descend 10 steps mod indep with rail support; pt does well to compensate with R foot limited ankle mobility   Wheelchair Mobility    Modified Rankin (Stroke Patients Only)        Balance Overall balance assessment: Needs assistance   Sitting balance-Leahy Scale: Good       Standing balance-Leahy Scale: Good               High level balance activites: Side stepping;Backward walking;Direction changes;Turns;Sudden stops;Head turns High Level Balance Comments: No LOB noted with higher level balance tasks            Cognition Arousal/Alertness: Awake/alert Behavior During Therapy: WFL for tasks assessed/performed Overall Cognitive Status: Within Functional Limits for tasks assessed                                        Exercises      General Comments General comments (skin integrity, edema, etc.): SpO2 98% on RA, HR 76. Educ re: activity recommendations while admitted and with return home      Pertinent Vitals/Pain Pain Assessment: No/denies pain    Home Living                      Prior Function            PT Goals (current goals can now be found in the care plan section) Progress towards PT goals: Goals met/education completed, patient discharged from PT    Frequency    Min 3X/week      PT Plan Current plan remains appropriate    Co-evaluation              AM-PAC PT "6 Clicks" Mobility  Outcome Measure  Help needed turning from your back to your side while in a flat bed without using bedrails?: None Help needed moving from lying on your back to sitting on the side of a flat bed without using bedrails?: None Help needed moving to and from a bed to a chair (including a wheelchair)?: None Help needed standing up from a chair using your arms (e.g., wheelchair or bedside chair)?: None Help needed to walk in hospital room?: None Help needed climbing 3-5 steps with a railing? : None 6 Click Score: 24    End of Session   Activity Tolerance: Patient tolerated treatment well Patient left: in chair;with call bell/phone within reach Nurse Communication: Mobility status PT Visit Diagnosis:  Other abnormalities of gait and mobility (R26.89)     Time: 9147-8295 PT Time Calculation (min) (ACUTE ONLY): 14 min  Charges:  $Gait Training: 8-22 mins                     Mabeline Caras, PT, DPT Acute Rehabilitation Services  Pager [phone] Office [place] 08/27/2020, 11:16 AM

## 2020-08-27 NOTE — Progress Notes (Signed)
Cardiology Progress Note  Patient ID: [name] MRN: [id number] DOB: 09-26-1940 Date of Encounter: 08/27/2020  Primary Cardiologist: Sanda Klein, MD  Subjective   Chief Complaint: None.  HPI: Good diuresis.  Appears euvolemic.  Transition to oral diuretics today.  Transfuse 1 unit of packed red blood cells.  ROS:  All other ROS reviewed and negative. Pertinent positives noted in the HPI.     Inpatient Medications  Scheduled Meds:  amiodarone  200 mg Oral Daily   amLODipine  10 mg Oral Daily   carvedilol  25 mg Oral BID WC   enoxaparin (LOVENOX) injection  40 mg Subcutaneous Q24H   ferrous sulfate  325 mg Oral Q breakfast   insulin aspart  0-5 Units Subcutaneous QHS   insulin aspart  0-9 Units Subcutaneous TID WC   losartan  100 mg Oral Daily   multivitamin with minerals  1 tablet Oral Daily   pantoprazole  40 mg Oral BID   torsemide  20 mg Oral Daily   Continuous Infusions:  PRN Meds: HYDROcodone-acetaminophen, ipratropium-albuterol   Vital Signs   Vitals:   08/26/20 1730 08/26/20 2003 08/27/20 0504 08/27/20 0831  BP: 130/61 136/65 (!) 128/57 136/67  Pulse: 65 64 63 66  Resp: 12 18 18    Temp: 98.6 F (37 C) (!) 97.5 F (36.4 C) 98 F (36.7 C) 98.1 F (36.7 C)  TempSrc: Oral Oral Oral Oral  SpO2: 95% 99% 99% 98%  Weight:   97.7 kg   Height:        Intake/Output Summary (Last 24 hours) at 08/27/2020 0914 Last data filed at 08/27/2020 0558 Gross per 24 hour  Intake 915.33 ml  Output 3075 ml  Net -2159.67 ml   Last 3 Weights 08/27/2020 08/26/2020 08/25/2020  Weight (lbs) 215 lb 6.4 oz 218 lb 3.2 oz 227 lb 3.2 oz  Weight (kg) 97.705 kg 98.975 kg 103.057 kg      Telemetry  Overnight telemetry shows a paced rhythm in the 60s, which I personally reviewed.   Physical Exam   Vitals:   08/26/20 1730 08/26/20 2003 08/27/20 0504 08/27/20 0831  BP: 130/61 136/65 (!) 128/57 136/67  Pulse: 65 64 63 66  Resp: 12 18 18    Temp: 98.6 F (37 C) (!) 97.5 F  (36.4 C) 98 F (36.7 C) 98.1 F (36.7 C)  TempSrc: Oral Oral Oral Oral  SpO2: 95% 99% 99% 98%  Weight:   97.7 kg   Height:        Intake/Output Summary (Last 24 hours) at 08/27/2020 0914 Last data filed at 08/27/2020 0558 Gross per 24 hour  Intake 915.33 ml  Output 3075 ml  Net -2159.67 ml    Last 3 Weights 08/27/2020 08/26/2020 08/25/2020  Weight (lbs) 215 lb 6.4 oz 218 lb 3.2 oz 227 lb 3.2 oz  Weight (kg) 97.705 kg 98.975 kg 103.057 kg    Body mass index is 31.81 kg/m.  General: Well nourished, well developed, in no acute distress Head: Atraumatic, normal size  Eyes: PEERLA, EOMI  Neck: Supple, no JVD Endocrine: No thryomegaly Cardiac: Normal S1, S2; RRR; no murmurs, rubs, or gallops Lungs: Clear to auscultation bilaterally, no wheezing, rhonchi or rales  Abd: Soft, nontender, no hepatomegaly  Ext: 1+ edema in the ankles Musculoskeletal: No deformities, BUE and BLE strength normal and equal Skin: Warm and dry, no rashes   Neuro: Alert and oriented to person, place, time, and situation, CNII-XII grossly intact, no focal deficits  Psych: Normal mood  and affect   Labs  High Sensitivity Troponin:   Recent Labs  Lab 08/23/20 1827 08/23/20 2205  TROPONINIHS 8 8     Cardiac EnzymesNo results for input(s): TROPONINI in the last 168 hours. No results for input(s): TROPIPOC in the last 168 hours.  Chemistry Recent Labs  Lab 08/23/20 1827 08/24/20 0619 08/25/20 0256 08/26/20 0413 08/27/20 0338  NA 136  --  133* 134* 134*  K 4.2  --  4.1 3.8 3.8  CL 106  --  102 99 101  CO2 20*  --  23 24 26   GLUCOSE 111*  --  147* 130* 147*  BUN 29*  --  28* 31* 34*  CREATININE 1.85*   < > 2.06* 2.30* 2.37*  CALCIUM 8.3*  --  8.7* 8.8* 8.8*  PROT 7.0  --   --   --   --   ALBUMIN 3.7  --   --   --   --   AST 19  --   --   --   --   ALT 19  --   --   --   --   ALKPHOS 77  --   --   --   --   BILITOT 0.4  --   --   --   --   GFRNONAA 37*   < > 32* 28* 27*  ANIONGAP 10  --  8 11 7     < > = values in this interval not displayed.    Hematology Recent Labs  Lab 08/24/20 0619 08/26/20 0413 08/26/20 1941 08/27/20 0338  WBC 5.6 4.6  --  5.3  RBC 2.83* 2.63*  --  2.96*  HGB 7.8* 7.1* 8.6* 8.1*  HCT 24.9* 23.0* 27.4* 25.5*  MCV 88.0 87.5  --  86.1  MCH 27.6 27.0  --  27.4  MCHC 31.3 30.9  --  31.8  RDW 14.8 14.7  --  15.0  PLT 147* 134*  --  140*   BNP Recent Labs  Lab 08/23/20 2223  BNP 246.8*    DDimer No results for input(s): DDIMER in the last 168 hours.   Radiology  No results found.  Cardiac Studies  TTE 08/24/2020 1. Left ventricular ejection fraction, by estimation, is 50 to 55%. The  left ventricle has low normal function. The left ventricle has no regional  wall motion abnormalities. The left ventricular internal cavity size was  mildly dilated. There is mild  concentric left ventricular hypertrophy. Indeterminate diastolic filling  due to E-A fusion. There is the interventricular septum is flattened in  diastole ('D' shaped left ventricle), consistent with right ventricular  volume overload.   2. Right ventricular systolic function is moderately reduced. The right  ventricular size is severely enlarged. There is mildly elevated pulmonary  artery systolic pressure.   3. Left atrial size was mild to moderately dilated.   4. Right atrial size was severely dilated.   5. The mitral valve is normal in structure. Mild to moderate mitral valve  regurgitation.   6. Tricuspid valve regurgitation is moderate.   7. The aortic valve is tricuspid. Aortic valve regurgitation is not  visualized.   8. The inferior vena cava is dilated in size with >50% respiratory  variability, suggesting right atrial pressure of 8 mmHg.   Patient Profile  Alan [name] is a 80 y.o. male with CAD status post CABG, diastolic heart failure, paroxysmal atrial fibrillation (not on anticoagulation due to recurrent GI bleeding), recurrent  GI bleed, CKD stage III, hypertension,  obesity, diabetes who was admitted on 08/23/2020 for acute on chronic diastolic heart failure.  Assessment & Plan   Acute on chronic diastolic heart failure -Net -2.5 L overnight.  Net -8.9 L since admission.  Weight is down from 102 kg to 97.7 kg. -Transition to oral torsemide 20 mg daily.   2.  Persistent atrial fibrillation status post pacemaker -Denies symptoms.  No anticoagulation due to GI bleeding. -Continue amiodarone 200 mg daily.  Maintaining sinus rhythm.  3.  Hypertension -Continue home agents  4.  Anemia -Status post 1 unit of packed red blood cells.  Hemoglobin much improved.  Will need GI follow-up upon discharge.  5.  CAD status post CABG -On a beta-blocker.  Not on a statin due to myopathy.  No aspirin due to anemia.   [facility] will sign off.   Medication Recommendations: Transition to oral torsemide as above.  All other medications of the same. Other recommendations (labs, testing, etc): Outpatient GI follow-up Follow up as an outpatient: We will arrange hospital follow-up appointment in 1 to 2 weeks.  For questions or updates, please contact [place] Please consult [email or web address] for contact info under   Time Spent with Patient: I have spent a total of 25 minutes with patient reviewing hospital notes, telemetry, EKGs, labs and examining the patient as well as establishing an assessment and plan that was discussed with the patient.  > 50% of time was spent in direct patient care.    Signed, Addison Naegeli. Audie Box, MD, [place]  08/27/2020 9:14 AM

## 2020-08-31 ENCOUNTER — Other Ambulatory Visit: Payer: Self-pay

## 2020-08-31 ENCOUNTER — Ambulatory Visit (INDEPENDENT_AMBULATORY_CARE_PROVIDER_SITE_OTHER): Payer: Medicare HMO | Admitting: Cardiovascular Disease

## 2020-08-31 ENCOUNTER — Encounter: Payer: Self-pay | Admitting: Cardiovascular Disease

## 2020-08-31 VITALS — BP 130/62 | HR 72 | Ht 69.5 in | Wt 216.4 lb

## 2020-08-31 DIAGNOSIS — Z7901 Long term (current) use of anticoagulants: Secondary | ICD-10-CM | POA: Diagnosis not present

## 2020-08-31 DIAGNOSIS — K921 Melena: Secondary | ICD-10-CM

## 2020-08-31 DIAGNOSIS — I480 Paroxysmal atrial fibrillation: Secondary | ICD-10-CM

## 2020-08-31 DIAGNOSIS — D649 Anemia, unspecified: Secondary | ICD-10-CM | POA: Diagnosis not present

## 2020-08-31 NOTE — Patient Instructions (Signed)
Medication Instructions:  Your physician recommends that you continue on your current medications as directed. Please refer to the Current Medication list given to you today.  *If you need a refill on your cardiac medications before your next appointment, please call your pharmacy*   Lab Work: None Ordered If you have labs (blood work) drawn today and your tests are completely normal, you will receive your results only by: [place] (if you have MyChart) OR A paper copy in the mail If you have any lab test that is abnormal or we need to change your treatment, we will call you to review the results.   Testing/Procedures: None Ordered   Follow-Up: Your physician recommends that you schedule a follow-up appointment in: As Directed by Dr. Jeronimo Greaves. [name]

## 2020-08-31 NOTE — Progress Notes (Signed)
Watchman Consult Note   Date:  08/31/2020   ID:  Alan [name], DOB 1941/01/19, MRN [id number]  PCP:  Alan Noon, MD  Cardiologist:  Alan [name] Primary Electrophysiologist: none Referring Physician: Dr Alan [name]   CC: to discuss Watchman implant    History of Present Illness: Alan [name] is a 80 y.o. male referred by Alan [name] for evaluation of atrial fibrillation and stroke prevention. He has paroxysmal atrial fibrillation as well as chronic GI bleeding.  The patient has been evaluated by their referring physician and is felt to be a poor candidate for long term [place] due to anemia and GI bleeding.  He therefore presents today for Watchman evaluation.   The patient has a complicated cardiac and medical history.  He underwent initial CABG in 1990 and redo CABG in 2004.  He has had sick sinus syndrome and is undergone dual-chamber permanent pacemaker placement.  Other pertinent comorbidities include congestive heart failure, stage IIIb chronic kidney disease, and type 2 diabetes.  The patient has had well-controlled atrial fibrillation with a very low A. fib burden until recently when he was found to have a 24-hour episode of atrial fibrillation.  He has had a lot of problems over time with gastrointestinal bleeding.  The patient has undergone extensive evaluation in the past with upper endoscopy, colonoscopy, and capsule endoscopies.  He has had angio ectasia treated with vascular clipping of the jejunum in the past.  He is also had a duodenal ulcer, hemorrhoids, and small adenomas on colonoscopy.  He was hospitalized in the [facility] system with melena in May of this year.  A CT angiogram showed no acute bleeding source.  Small bowel enteroscopy demonstrated no active bleeding sites.  He was then hospitalized last week with decompensated heart failure and volume overload.  He has improved with diuretic treatment.  He presents today for discussion of watchman implantation.  The patient is  here with his wife today.  He denies any recurrent bleeding since hospital discharge.  Shortness of breath and leg swelling are stable.  No chest pain.  No interval complaints.   Past Medical History:  Diagnosis Date   Anticoagulant long-term use    Arthritis    Arthropathy of left shoulder    Atypical mole    Biceps tendonitis, left    Bursitis of left shoulder    Cardiac pacemaker in situ    first insertion 2008;  genertor and new lead change 03-08-2012;   [employer]   CHB (complete heart block) ([place])    s/p  PPM 2008   Chronic back pain    CKD (chronic kidney disease), stage III [facility])    Coronary artery disease cardiologist--- Alan [name]   hx  CABG x2  1990 and re-do 2004;  multiple PCI to [place] to 2004;    cardiac cath 2012  occluded LAD and RCA with patent grafts   Difficult intubation    " Alan. Sallyanne [name] said that it was difficulty to get the tube in."   History of coronary angioplasty    multiple PCI to RCA ,  1990 to 2004   History of GI bleed followed by GI-- Alan Perches [facility] [place]   01/ 2020  upper and lower GI bleed ;  s/p  EGD with cautery of jejunal bleed and blood transfusion's   History of kidney stones    Hx of echocardiogram 06/29/2008   EF 45-50%    Hypertension  Ischemic cardiomyopathy    previously reported , ef 40-45%;  2010--- ef 45-50%;   last echo 2017 55-60%   Mixed hyperlipidemia    Nocturia    OSA on CPAP    cpap, 12, last sleep study Jan2013, sees Alan. Keturah [name]   PAF (paroxysmal atrial fibrillation) ([place])    long hx PAF--- followed by Alan [name]   Post-polio syndrome    dx polio age 80;   09-30-2018  per pt occasional gait issues and occasion fall   Presence of permanent cardiac pacemaker    Pseudoarthrosis of lumbar spine    Rotator cuff tear, left    S/P CABG x 2    1990 x2  and 2004  x2   Sinus node dysfunction ([place])    2015--- sinus node arrest  with intact AV conduction   Squamous cell carcinoma of skin     Type 2 diabetes mellitus ([place])    followed by pcp   Past Surgical History:  Procedure Laterality Date   APPENDECTOMY  child   APPLICATION OF INTRAOPERATIVE CT SCAN N/A 07/20/2020   Procedure: APPLICATION OF INTRAOPERATIVE CT SCAN;  Surgeon: Alan [name], Theodoro Doing, DO;  Location: [place];  Service: Neurosurgery;  Laterality: N/A;   BLEPHAROPLASTY Bilateral 12/2014   upper eyelid   CARDIAC PACEMAKER PLACEMENT  2008   CARDIOVERSION N/A 01/15/2019   Procedure: CARDIOVERSION;  Surgeon: Alan Klein, MD;  Location: [place] ENDOSCOPY;  Service: Cardiovascular;  Laterality: N/A;   CARPAL TUNNEL RELEASE Bilateral 2015;  2016   CATARACT EXTRACTION W/ INTRAOCULAR LENS  IMPLANT, BILATERAL  2017   CORONARY ANGIOPLASTY  1990  to 2004   multiple to [place]    @ [place] (Alan Alan [name])   seq. LIMA to LAD and Diagonal   CORONARY ARTERY BYPASS GRAFT  2004     Alan [name]   SVG to  RCA and PDA;  ALSO MAZE  PROCEDURE   ESOPHAGOGASTRODUODENOSCOPY Left 03/18/2013   Procedure: ESOPHAGOGASTRODUODENOSCOPY (EGD);  Surgeon: Alan Nipper, MD;  Location: [facility] ENDOSCOPY;  Service: Endoscopy;  Laterality: Left;   HARDWARE REMOVAL N/A 05/18/2016   Procedure: Removal of broken hardware Lumbar three-four;  Surgeon: Alan Moore, MD;  Location: [place];  Service: Neurosurgery;  Laterality: N/A;   HERNIA REPAIR     LEFT HEART CATH AND CORS/GRAFTS ANGIOGRAPHY N/A 12/18/2018   Procedure: LEFT HEART CATH AND CORS/GRAFTS ANGIOGRAPHY;  Surgeon: Alan Hampshire, MD;  Location: [place] CV LAB;  Service: Cardiovascular;  Laterality: N/A;   LEFT HEART CATHETERIZATION WITH CORONARY/GRAFT ANGIOGRAM N/A 12/21/2010   Procedure: LEFT HEART CATHETERIZATION WITH Beatrix Fetters;  Surgeon: Leonie Man, MD;  Location: [facility] CATH LAB;  Service: Cardiovascular;  Laterality: N/A;   LEG SURGERY Right 2002  approx.   lengthened his leg   LUMBAR LAMINECTOMY/DECOMPRESSION MICRODISCECTOMY  01/10/2012    Procedure: LUMBAR LAMINECTOMY/DECOMPRESSION MICRODISCECTOMY 1 LEVEL;  Surgeon: Alan Moore, MD;  Location: [place] NEURO ORS;  Service: Neurosurgery;  Laterality: Bilateral;  Lumbar three-four decompression, Posterior lateral fusion lumbar three-four, posterior spinus plate lumbar three-four   LUMBAR LAMINECTOMY/DECOMPRESSION MICRODISCECTOMY Left 05/18/2016   Procedure: Laminectomy and Foraminotomy - Lumbar two-lumbar three- Lumbar four-lumbar five- left with removal hardware lumbar three-four;  Surgeon: Alan Moore, MD;  Location: [place];  Service: Neurosurgery;  Laterality: Left;   NASAL SEPTUM SURGERY  yrs ago   PACEMAKER REVISION N/A 03/07/2012   Procedure: PACEMAKER REVISION;  Surgeon: Alan Klein, MD;  Location: [place] CATH LAB;  Service: Cardiovascular;  Laterality: N/A;   POSTERIOR LUMBAR FUSION  02-22-2017   Alan Ronnald Ramp  @ [facility]   re-do laminectomy L2-3 and fusion   POSTERIOR LUMBAR FUSION  03-11-2018   Alan Ronnald Ramp @ [facility]   laminectomy L4-5;  fixation L2-S1 and fusion L3-S1   SACROILIAC JOINT FUSION N/A 07/20/2020   Procedure: Sacroiliac Joint Fusion;  Surgeon: Karsten Ro, DO;  Location: [place];  Service: Neurosurgery;  Laterality: N/A;   SHOULDER ARTHROSCOPY Right after 2016, pt unsure year   SHOULDER ARTHROSCOPY WITH OPEN ROTATOR CUFF REPAIR Right 03/30/2014   Procedure: RIGHT SHOULDER ARTHROSCOPY  OPEN ROTATOR CUFF REPAIR;  Surgeon: Kerin Salen, MD;  Location: [place];  Service: Orthopedics;  Laterality: Right;   SHOULDER ARTHROSCOPY WITH SUBACROMIAL DECOMPRESSION, ROTATOR CUFF REPAIR AND BICEP TENDON REPAIR Left 10/01/2018   Procedure: LEFT SHOULDER ARTHROSCOPY, DISTAL CLAVICLE EXCISION,SUBACROMIAL, DECOMPRESSION, RORATOR CUFF REPAIR, BICEPS TENODESIS;  Surgeon: Renette Butters, MD;  Location: [place];  Service: Orthopedics;  Laterality: Left;   SHOULDER OPEN ROTATOR CUFF REPAIR Right 03/30/2014   Procedure: ROTATOR CUFF REPAIR SHOULDER OPEN;  Surgeon: Kerin Salen, MD;  Location:  [place];  Service: Orthopedics;  Laterality: Right;   TEE WITHOUT CARDIOVERSION N/A 01/15/2019   Procedure: TRANSESOPHAGEAL ECHOCARDIOGRAM (TEE);  Surgeon: Alan Klein, MD;  Location: [place];  Service: Cardiovascular;  Laterality: N/A;   TONSILLECTOMY  child   TOTAL KNEE ARTHROPLASTY Left 2006     Current Outpatient Medications  Medication Sig Dispense Refill   amiodarone (PACERONE) 200 MG tablet Take 200 mg by mouth daily.     amLODipine (NORVASC) 10 MG tablet Take 10 mg by mouth daily.      carvedilol (COREG) 25 MG tablet Take 1 tablet (25 mg total) by mouth 2 (two) times daily with a meal. 120 tablet 3   ferrous sulfate 325 (65 FE) MG tablet Take 1 tablet (325 mg total) by mouth daily with breakfast. 30 tablet 0   glipiZIDE (GLUCOTROL) 10 MG tablet Take 10 mg by mouth 2 (two) times daily.     HYDROcodone-acetaminophen (NORCO) 10-325 MG tablet Take 1 tablet by mouth every 4 (four) hours as needed for moderate pain ((score 4 to 6)). 30 tablet 0   insulin lispro (HUMALOG) 100 UNIT/ML injection Inject 2-8 Units into the skin as needed for high blood sugar.      losartan (COZAAR) 100 MG tablet Take 100 mg by mouth daily.      metFORMIN (GLUCOPHAGE) 500 MG tablet Take 500 mg by mouth 2 (two) times daily with a meal. Takes with 500mg  XR     metFORMIN (GLUCOPHAGE-XR) 500 MG 24 hr tablet Take 500 mg by mouth 2 (two) times daily.     Multiple Vitamin (MULTIVITAMIN WITH MINERALS) TABS tablet Take 1 tablet by mouth daily.      pantoprazole (PROTONIX) 40 MG tablet Take 1 tablet (40 mg total) by mouth 2 (two) times daily. 60 tablet 0   Potassium Chloride ER 20 MEQ TBCR Take 1 tablet by mouth once daily 90 tablet 3   torsemide (DEMADEX) 20 MG tablet Take 1 tablet (20 mg total) by mouth daily. 30 tablet 0   No current facility-administered medications for this visit.    Allergies:   Patient has no known allergies.   Social History:  The patient  reports that he has never smoked. He has never  used smokeless tobacco. He reports previous alcohol use. He reports  that he does not use drugs.   Family History:  The patient's  family history includes Cancer in his mother; Other in his father.    ROS:  Please see the history of present illness.   All other systems are reviewed and negative.    PHYSICAL EXAM: VS:  BP 130/62   Pulse 72   Ht 5' 9.5" (1.765 m)   Wt 216 lb 6.4 oz (98.2 kg)   SpO2 98%   BMI 31.50 kg/m  , BMI Body mass index is 31.5 kg/m. GEN: Well nourished, well developed, elderly, obese male in no acute distress  HEENT: normal  Neck: no JVD, carotid bruits, or masses Cardiac: RRR; no murmurs, rubs, or gallops, 1+ bilateral pretibial edema  Respiratory:  clear to auscultation bilaterally, normal work of breathing GI: soft, nontender, nondistended, + BS MS: no deformity or atrophy  Skin: warm and dry  Neuro:  Strength and sensation are intact Psych: euthymic mood, full affect  EKG:  EKG is not ordered today.   Recent Labs: 08/23/2020: ALT 19; B Natriuretic Peptide 246.8 08/24/2020: Magnesium 2.2 08/25/2020: TSH 0.879 08/27/2020: BUN 34; Creatinine, Ser 2.37; Hemoglobin 8.1; Platelets 140; Potassium 3.8; Sodium 134    Lipid Panel     Component Value Date/Time   CHOL 134 12/20/2010 0903   TRIG 104 12/20/2010 0903   HDL 38 (L) 12/20/2010 0903   CHOLHDL 3.5 12/20/2010 0903   VLDL 21 12/20/2010 0903   LDLCALC 75 12/20/2010 0903     Wt Readings from Last 3 Encounters:  08/31/20 216 lb 6.4 oz (98.2 kg)  08/27/20 215 lb 6.4 oz (97.7 kg)  07/20/20 227 lb 1.6 oz (103 kg)      Other studies Reviewed: Additional studies/ records that were reviewed today include:  TEE 01-15-2019: IMPRESSIONS     1. Left ventricular ejection fraction, by visual estimation, is 55 to  60%. The left ventricle has normal function. Normal left ventricular size.  There is no left ventricular hypertrophy.   2. Abnormal septal motion consistent with RV pacemaker.   3. The left  ventricle has no regional wall motion abnormalities.   4. Global right ventricle has normal systolic function.The right  ventricular size is normal. No increase in right ventricular wall  thickness.   5. Left atrial size was moderately dilated.   6. The left atrial appendage is surgically clipped, but there appears to  be a permeable lobe, in the immediate vicinity of the left upper pulmonary  vein. No thrombus is seen in the left atrium.   7. Right atrial size was mildly dilated.   8. The mitral valve is normal in structure. Mild mitral valve  regurgitation.   9. The tricuspid valve is normal in structure. Tricuspid valve  regurgitation is trivial.  10. The aortic valve is normal in structure. Aortic valve regurgitation is  not visualized.  11. The pulmonic valve was normal in structure. Pulmonic valve  regurgitation is not visualized.  12. A pacer wire is visualized.  13. Pacemaker leads present in the right atrium. There is a tiny  filamentous mobile mass (2x7 mm) on one of the leads, possibly a thrombus,  chronicity unknown.   ASSESSMENT AND PLAN:  1.  Paroxysmal atrial fibrillation I have seen Alan [name] is a 80 y.o. male in the office today who has been referred by Alan [name] for a Watchman left atrial appendage closure device.  He has a history of paroxysmal atrial fibrillation.  This patients CHA2DS2-VASc  Score and unadjusted Ischemic Stroke Rate (% per year) is equal to 7.2 % stroke rate/year from a score of 5 which necessitates long term oral anticoagulation to prevent stroke.  Unfortunately, He is not felt to be a long term anticoagulation candidate secondary to GI bleeding.    The patient and his wife are counseled extensively today.  He has had GI bleeding off of all anticoagulation.  He has been evaluated extensively by GI in the past, but he does not have a local gastroenterologist and it has been several years since he has undergone colonoscopy or capsule endoscopy.  He  would like to establish with a gastroenterologist and he will be referred to [facility] to see if any further testing is indicated at this time.  I think this is a logical first step since he would require some anticoagulation and/or antiplatelet therapy over a period of 6 months with watchman implantation.  I went back and reviewed the patient's cardiac surgical history which is somewhat complex.  He had an initial bypass surgery with a sequential LIMA graft.  He later underwent grafting of the RCA as well as a surgical Maze procedure by Alan. Amador [name] at [facility].  There is a TEE report from 2020 that describes a surgical clip on the left atrial appendage with the possibility of a patent lobe.  I will review his TEE images, but with previous surgical clipping he may not be a candidate for any of the currently available left atrial appendage occlusion devices.  I will review all of this with Alan. Sallyanne [name] so that we can determine next steps in his evaluation and treatment.  If left atrial appendage therapies are not an option for him, he should probably stay off of anticoagulation unless there is a treatable gastrointestinal lesion that is found to be the culprit for his bleeding.  Current medicines are reviewed at length with the patient today.   The patient does not have concerns regarding his medicines.  The following changes were made today:  none  Labs/ tests ordered today include:  Orders Placed This Encounter  Procedures   Ambulatory referral to Gastroenterology     Signed, Sherren Mocha, MD 08/31/2020  5:47 PM     [place] [address] [place] [id number] [phone] (office) [phone] (fax)

## 2020-09-07 ENCOUNTER — Ambulatory Visit: Payer: Medicare HMO | Admitting: Student

## 2020-09-29 ENCOUNTER — Telehealth: Payer: Self-pay | Admitting: *Deleted

## 2020-09-29 NOTE — Telephone Encounter (Signed)
Referral to GI Received: Today Price, [address] Triage FYI,   Called [facility] to get the status on the referral.  They had not called the patient to schedule appt.  I tried to schedule appt and was told that patient had been seen at [facility] by Dr Lilyan Gilford and they will need those notes and test results before they could schedule appt.  Called patient to ask if he was still a patient at [facility] and he said he did not know and did not remember going there unless it had been a while.  So, just letting Dr Burt Knack know it may be a while before [place] schedules this appt.  Do you want to refer patient back to [facility] or wait on [facility]?  I think patient needs to be seen before we can do a procedure (I think).         Referral to GI Received: Today Sherren Mocha, MD LPN; Sherren Mocha, MD Thanks.  There is no rush on the GI referral as it appears the patient will not be a candidate for a watchman device because of previous left atrial appendage clip placement.  He should stay off of anticoagulation until he is evaluated by GI.  I called him and left a message on his voicemail.  I will try him back to follow-up with him.

## 2020-10-05 ENCOUNTER — Ambulatory Visit (INDEPENDENT_AMBULATORY_CARE_PROVIDER_SITE_OTHER): Payer: Medicare HMO

## 2020-10-05 DIAGNOSIS — I480 Paroxysmal atrial fibrillation: Secondary | ICD-10-CM | POA: Diagnosis not present

## 2020-10-06 LAB — CUP PACEART REMOTE DEVICE CHECK
Battery Impedance: 731 Ohm
Battery Remaining Longevity: 70 mo
Battery Voltage: 2.77 V
Brady Statistic AP VP Percent: 17 %
Brady Statistic AP VS Percent: 82 %
Brady Statistic AS VP Percent: 0 %
Brady Statistic AS VS Percent: 1 %
Date Time Interrogation Session: 20220831101854
Implantable Lead Implant Date: 20080208
Implantable Lead Implant Date: 20140130
Implantable Lead Location: 753859
Implantable Lead Location: 753860
Implantable Lead Model: 5076
Implantable Lead Model: 5076
Implantable Pulse Generator Implant Date: 20140130
Lead Channel Impedance Value: 436 Ohm
Lead Channel Impedance Value: 467 Ohm
Lead Channel Pacing Threshold Amplitude: 0.75 V
Lead Channel Pacing Threshold Amplitude: 0.875 V
Lead Channel Pacing Threshold Pulse Width: 0.4 ms
Lead Channel Pacing Threshold Pulse Width: 0.4 ms
Lead Channel Setting Pacing Amplitude: 2 V
Lead Channel Setting Pacing Amplitude: 2.5 V
Lead Channel Setting Pacing Pulse Width: 0.4 ms
Lead Channel Setting Sensing Sensitivity: 2.8 mV

## 2020-10-18 NOTE — Progress Notes (Signed)
Carelink Summary Report / Loop Recorder 

## 2020-10-20 ENCOUNTER — Other Ambulatory Visit: Payer: Self-pay | Admitting: Cardiovascular Disease

## 2020-11-09 ENCOUNTER — Other Ambulatory Visit: Payer: Self-pay

## 2020-11-09 ENCOUNTER — Ambulatory Visit: Payer: Medicare HMO | Admitting: Physician Assistant

## 2020-11-09 DIAGNOSIS — D489 Neoplasm of uncertain behavior, unspecified: Secondary | ICD-10-CM

## 2020-11-09 DIAGNOSIS — D485 Neoplasm of uncertain behavior of skin: Secondary | ICD-10-CM

## 2020-11-09 DIAGNOSIS — D044 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: Secondary | ICD-10-CM | POA: Diagnosis not present

## 2020-11-09 DIAGNOSIS — L570 Actinic keratosis: Secondary | ICD-10-CM

## 2020-11-09 MED ORDER — TOLAK 4 % EX CREA: TOPICAL_CREAM | CUTANEOUS | 0 refills | Status: DC

## 2020-11-09 NOTE — Patient Instructions (Addendum)
Biopsy, Surgery (Curettage) & Surgery (Excision) Aftercare Instructions  1. Okay to remove bandage in 24 hours  2. Wash area with soap and water  3. Apply Vaseline to area twice daily until healed (Not Neosporin)  4. Okay to cover with a Band-Aid to decrease the chance of infection or prevent irritation from clothing; also it's okay to uncover lesion at home.  5. Suture instructions: return to our office in 7-10 or 10-14 days for a nurse visit for suture removal. Variable healing with sutures, if pain or itching occurs call our office. It's okay to shower or bathe 24 hours after sutures are given.  6. The following risks may occur after a biopsy, curettage or excision: bleeding, scarring, discoloration, recurrence, infection (redness, yellow drainage, pain or swelling).  7. For questions, concerns and results call our office at Monday-Thursday before 4pm & Friday before 3pm. Biopsy results will be available in 1 week.   Contact [facility] tomorrow if you do not hear from them today @ [phone] 

## 2020-11-10 ENCOUNTER — Encounter: Payer: Self-pay | Admitting: Physician Assistant

## 2020-11-19 NOTE — Progress Notes (Signed)
Follow-Up Visit   Subjective  Alan [name] is a 80 y.o. male who presents for the following: Skin Problem (Patient here today for lesions on both arms and scalp per patient there was one lesion that was bleeding on his scalp but it's stopped. ).   The following portions of the chart were reviewed this encounter and updated as appropriate:  Tobacco  Allergies  Meds  Problems  Med Hx  Surg Hx  Fam Hx      Objective  Well appearing patient in no apparent distress; mood and affect are within normal limits.  All skin waist up examined.  Left Forearm - Posterior (10), Right Forearm - Posterior (10) Erythematous patches with gritty scale.     Right Mid Frontal Scalp Hyperkeratotic scale with pink base        Left Mid Parietal Scalp Hyperkeratotic scale with pink base        Left Mid Frontal Scalp Hyperkeratotic scale with pink base        Right Mid Parietal Scalp Hyperkeratotic scale with pink base        Mid Upper Vermilion Lip Hyperkeratotic scale with pink base         Assessment & Plan  AK (actinic keratosis) (20) Left Forearm - Posterior (10); Right Forearm - Posterior (10)  Destruction of lesion - Left Forearm - Posterior, Right Forearm - Posterior Complexity: simple   Destruction method: cryotherapy   Informed consent: discussed and consent obtained   Timeout:  patient name, date of birth, surgical site, and procedure verified Lesion destroyed using liquid nitrogen: Yes   Cryotherapy cycles:  4 Outcome: patient tolerated procedure well with no complications   Post-procedure details: wound care instructions given    Related Medications Fluorouracil (TOLAK) 4 % CREA Apply to affected area nightly x 2 weeks  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  Skin / nail biopsy Type of biopsy: tangential   Informed consent: discussed and consent obtained   Timeout: patient name, date of birth, surgical site,  and procedure verified   Procedure prep:  Patient was prepped and draped in usual sterile fashion (Non sterile) Prep type:  Chlorhexidine Anesthesia: the lesion was anesthetized in a standard fashion   Anesthetic:  1% lidocaine w/ epinephrine 1-100,000 local infiltration Instrument used: flexible razor blade   Hemostasis achieved with: aluminum chloride   Outcome: patient tolerated procedure well   Post-procedure details: sterile dressing applied and wound care instructions given   Dressing type: petrolatum    Destruction of lesion Complexity: simple   Destruction method: electrodesiccation and curettage   Informed consent: discussed and consent obtained   Timeout:  patient name, date of birth, surgical site, and procedure verified Anesthesia: the lesion was anesthetized in a standard fashion   Anesthetic:  1% lidocaine w/ epinephrine 1-100,000 local infiltration Curettage performed in three different directions: Yes   Curettage cycles:  1 Margin per side (cm):  0.1 Final wound size (cm):  1.1 Hemostasis achieved with:  aluminum chloride Outcome: patient tolerated procedure well with no complications   Post-procedure details: wound care instructions given    Specimen 2 - Surgical pathology Differential Diagnosis: R/O BCC vs SCC - treated after biopsy  Check Margins: No  Left Mid Parietal Scalp  Skin / nail biopsy Type of biopsy: tangential   Informed consent: discussed and consent obtained   Timeout: patient name, date of birth, surgical site, and procedure verified   Procedure prep:  Patient was prepped and draped in usual sterile fashion (Non sterile) Prep type:  Chlorhexidine Anesthesia: the lesion was anesthetized in a standard fashion   Anesthetic:  1% lidocaine w/ epinephrine 1-100,000 local infiltration Instrument used: flexible razor blade   Hemostasis achieved with: aluminum chloride   Outcome: patient tolerated procedure well   Post-procedure details: sterile  dressing applied and wound care instructions given   Dressing type: petrolatum    Destruction of lesion Complexity: simple   Destruction method: electrodesiccation and curettage   Informed consent: discussed and consent obtained   Timeout:  patient name, date of birth, surgical site, and procedure verified Anesthesia: the lesion was anesthetized in a standard fashion   Anesthetic:  1% lidocaine w/ epinephrine 1-100,000 local infiltration Curettage performed in three different directions: Yes   Curettage cycles:  1 Margin per side (cm):  0.1 Final wound size (cm):  1.5 Hemostasis achieved with:  aluminum chloride Outcome: patient tolerated procedure well with no complications   Post-procedure details: sterile dressing applied and wound care instructions given   Dressing type: petrolatum    Specimen 3 - Surgical pathology Differential Diagnosis: R/O BCC vs SCC - treated after biopsy  Check Margins: No  Left Mid Frontal Scalp  Skin / nail biopsy Type of biopsy: tangential   Informed consent: discussed and consent obtained   Timeout: patient name, date of birth, surgical site, and procedure verified   Procedure prep:  Patient was prepped and draped in usual sterile fashion (Non sterile) Prep type:  Chlorhexidine Anesthesia: the lesion was anesthetized in a standard fashion   Anesthetic:  1% lidocaine w/ epinephrine 1-100,000 local infiltration Instrument used: flexible razor blade   Hemostasis achieved with: aluminum chloride   Outcome: patient tolerated procedure well   Post-procedure details: sterile dressing applied and wound care instructions given   Dressing type: petrolatum    Destruction of lesion Complexity: simple   Destruction method: electrodesiccation and curettage   Informed consent: discussed and consent obtained   Timeout:  patient name, date of birth, surgical site, and procedure verified Anesthesia: the lesion was anesthetized in a standard fashion    Anesthetic:  1% lidocaine w/ epinephrine 1-100,000 local infiltration Curettage performed in three different directions: Yes   Curettage cycles:  1 Margin per side (cm):  0.1 Final wound size (cm):  1.1 Hemostasis achieved with:  aluminum chloride Outcome: patient tolerated procedure well with no complications   Post-procedure details: wound care instructions given    Specimen 4 - Surgical pathology Differential Diagnosis: R/O BCC vs SCC - treated after biopsy  Check Margins: No  Right Mid Parietal Scalp  Skin / nail biopsy Type of biopsy: tangential   Informed consent: discussed and consent obtained   Timeout: patient name, date of birth, surgical site, and procedure verified   Procedure prep:  Patient was prepped and draped in usual sterile fashion (Non sterile) Prep type:  Chlorhexidine Anesthesia: the lesion was anesthetized in a standard fashion   Anesthetic:  1% lidocaine w/ epinephrine 1-100,000 local infiltration Instrument used: flexible razor blade   Outcome: patient tolerated procedure well   Post-procedure details: sterile dressing applied and wound care instructions given   Dressing type: petrolatum    Destruction of lesion Complexity: simple   Destruction method: electrodesiccation and curettage   Informed consent: discussed and consent obtained   Timeout:  patient name, date of birth, surgical site, and procedure verified Anesthesia: the lesion was anesthetized in a standard fashion   Anesthetic:  1%  lidocaine w/ epinephrine 1-100,000 local infiltration Curettage performed in three different directions: Yes   Curettage cycles:  1 Margin per side (cm):  0.1 Final wound size (cm):  1.1 Hemostasis achieved with:  aluminum chloride Outcome: patient tolerated procedure well with no complications   Post-procedure details: sterile dressing applied and wound care instructions given   Dressing type: petrolatum    Specimen 5 - Surgical pathology Differential  Diagnosis: R/O BCC vs SCC - treated after biopsy  Check Margins: No  Neoplasm of uncertain behavior Mid Upper Vermilion Lip  Skin / nail biopsy Type of biopsy: tangential   Informed consent: discussed and consent obtained   Timeout: patient name, date of birth, surgical site, and procedure verified   Procedure prep:  Patient was prepped and draped in usual sterile fashion (Non sterile) Prep type:  Chlorhexidine Anesthesia: the lesion was anesthetized in a standard fashion   Anesthetic:  1% lidocaine w/ epinephrine 1-100,000 local infiltration Instrument used: flexible razor blade   Hemostasis achieved with: aluminum chloride and electrodesiccation   Outcome: patient tolerated procedure well   Post-procedure details: sterile dressing applied and wound care instructions given   Dressing type: petrolatum    Destruction of lesion Complexity: simple   Destruction method: electrodesiccation and curettage   Informed consent: discussed and consent obtained   Timeout:  patient name, date of birth, surgical site, and procedure verified Anesthesia: the lesion was anesthetized in a standard fashion   Anesthetic:  1% lidocaine w/ epinephrine 1-100,000 local infiltration Curettage performed in three different directions: Yes   Electrodesiccation performed over the curetted area: Yes   Curettage cycles:  1 Margin per side (cm):  0.1 Final wound size (cm):  1 Hemostasis achieved with:  aluminum chloride Outcome: patient tolerated procedure well with no complications   Post-procedure details: wound care instructions given    Specimen 1 - Surgical pathology Differential Diagnosis: R/O BCC vs  SCC - treated after biopsy  Check Margins: No  No atypical nevi noted at the time of the visit.   I, Jizel Cheeks, PA-C, have reviewed all documentation's for this visit.  The documentation on 11/19/20 for the exam, diagnosis, procedures and orders are all accurate and complete.

## 2021-01-04 ENCOUNTER — Ambulatory Visit (INDEPENDENT_AMBULATORY_CARE_PROVIDER_SITE_OTHER): Payer: Medicare HMO

## 2021-01-04 DIAGNOSIS — I495 Sick sinus syndrome: Secondary | ICD-10-CM | POA: Diagnosis not present

## 2021-01-04 LAB — CUP PACEART REMOTE DEVICE CHECK
Battery Impedance: 780 Ohm
Battery Remaining Longevity: 67 mo
Battery Voltage: 2.78 V
Brady Statistic AP VP Percent: 16 %
Brady Statistic AP VS Percent: 83 %
Brady Statistic AS VP Percent: 0 %
Brady Statistic AS VS Percent: 0 %
Date Time Interrogation Session: 20221128063538
Implantable Lead Implant Date: 20080208
Implantable Lead Implant Date: 20140130
Implantable Lead Location: 753859
Implantable Lead Location: 753860
Implantable Lead Model: 5076
Implantable Lead Model: 5076
Implantable Pulse Generator Implant Date: 20140130
Lead Channel Impedance Value: 408 Ohm
Lead Channel Impedance Value: 438 Ohm
Lead Channel Pacing Threshold Amplitude: 0.75 V
Lead Channel Pacing Threshold Amplitude: 0.75 V
Lead Channel Pacing Threshold Pulse Width: 0.4 ms
Lead Channel Pacing Threshold Pulse Width: 0.4 ms
Lead Channel Setting Pacing Amplitude: 2 V
Lead Channel Setting Pacing Amplitude: 2.5 V
Lead Channel Setting Pacing Pulse Width: 0.4 ms
Lead Channel Setting Sensing Sensitivity: 2.8 mV

## 2021-01-12 NOTE — Progress Notes (Signed)
Remote pacemaker transmission.   

## 2021-01-25 ENCOUNTER — Telehealth: Payer: Self-pay | Admitting: Cardiovascular Disease

## 2021-01-25 NOTE — Telephone Encounter (Signed)
Pt c/o swelling: STAT is pt has developed SOB within 24 hours  How much weight have you gained and in what time span? PT SIGNIFICANT OTHER SAID " HE HAS NOT GAINED OR LOSS WEIGHT BUT SHE'S NOT SURE BECAUSE SHE DOESN'T LIVE WITH HIM"  If swelling, where is the swelling located? BILATERAL LEGS/FOOT   Are you currently taking a fluid pill? YES  Are you currently SOB? NO  Do you have a log of your daily weights (if so, list)?  NO  Have you gained 3 pounds in a day or 5 pounds in a week?   Have you traveled recently? PT'S SIGNIFICANT OTHER IS ONLY AT HIS HOME 3X'S PER WEEK SO SHE'S NOT SURE IF HE TRAVELS OR NOT

## 2021-01-25 NOTE — Telephone Encounter (Signed)
Called pt and explained that Dr. Burt Knack is not currently taking new patients and that he doesn't follow PPMs.  Explained to pt the need to stay with Dr. Sallyanne Kuster.  Pt was upset about the wait time for the phone lines and said that he has trouble getting in to see Dr. Sallyanne Kuster when he needs to.  States he called the other day and a gentleman told him that it would be May and he just hung up.  Advised pt again, the importance of sticking with Dr. Sallyanne Kuster.  Pt in agreement with plan.

## 2021-01-25 NOTE — Telephone Encounter (Signed)
Left message for PATIENT to call back as Lelon Frohlich (significant other) is not on DPR and could not address all the questions asked by operator, as noted.

## 2021-01-25 NOTE — Telephone Encounter (Signed)
Pt was advised during his hospitalization in July 2022 to make an appt with Burt Knack, pt is being followed by [name] but he no longer wants to see [name]. Pt wants a return call  for an appointment and to see Burt Knack Only

## 2021-01-26 NOTE — Telephone Encounter (Signed)
LM2CB 

## 2021-01-28 NOTE — Telephone Encounter (Signed)
Left message for pt to call.

## 2021-02-02 ENCOUNTER — Inpatient Hospital Stay (HOSPITAL_BASED_OUTPATIENT_CLINIC_OR_DEPARTMENT_OTHER)
Admission: EM | Admit: 2021-02-02 | Discharge: 2021-02-04 | DRG: 291 | Disposition: A | Discharge status: Home / Self Care | Payer: Medicare HMO | Attending: Internal Medicine | Admitting: Internal Medicine

## 2021-02-02 ENCOUNTER — Other Ambulatory Visit: Payer: Self-pay

## 2021-02-02 ENCOUNTER — Telehealth: Payer: Self-pay | Admitting: Cardiovascular Disease

## 2021-02-02 ENCOUNTER — Emergency Department (HOSPITAL_BASED_OUTPATIENT_CLINIC_OR_DEPARTMENT_OTHER): Payer: Medicare HMO | Admitting: Radiology

## 2021-02-02 ENCOUNTER — Encounter (HOSPITAL_BASED_OUTPATIENT_CLINIC_OR_DEPARTMENT_OTHER): Payer: Self-pay | Admitting: *Deleted

## 2021-02-02 DIAGNOSIS — Z85828 Personal history of other malignant neoplasm of skin: Secondary | ICD-10-CM | POA: Diagnosis not present

## 2021-02-02 DIAGNOSIS — Z981 Arthrodesis status: Secondary | ICD-10-CM | POA: Diagnosis not present

## 2021-02-02 DIAGNOSIS — I25718 Atherosclerosis of autologous vein coronary artery bypass graft(s) with other forms of angina pectoris: Secondary | ICD-10-CM | POA: Diagnosis not present

## 2021-02-02 DIAGNOSIS — Z20822 Contact with and (suspected) exposure to covid-19: Secondary | ICD-10-CM | POA: Diagnosis present

## 2021-02-02 DIAGNOSIS — N183 Chronic kidney disease, stage 3 unspecified: Secondary | ICD-10-CM | POA: Diagnosis present

## 2021-02-02 DIAGNOSIS — R0602 Shortness of breath: Secondary | ICD-10-CM | POA: Diagnosis not present

## 2021-02-02 DIAGNOSIS — I130 Hypertensive heart and chronic kidney disease with heart failure and stage 1 through stage 4 chronic kidney disease, or unspecified chronic kidney disease: Secondary | ICD-10-CM | POA: Diagnosis present

## 2021-02-02 DIAGNOSIS — N1832 Chronic kidney disease, stage 3b: Secondary | ICD-10-CM | POA: Diagnosis present

## 2021-02-02 DIAGNOSIS — I2510 Atherosclerotic heart disease of native coronary artery without angina pectoris: Secondary | ICD-10-CM | POA: Diagnosis present

## 2021-02-02 DIAGNOSIS — I25708 Atherosclerosis of coronary artery bypass graft(s), unspecified, with other forms of angina pectoris: Secondary | ICD-10-CM | POA: Diagnosis present

## 2021-02-02 DIAGNOSIS — E785 Hyperlipidemia, unspecified: Secondary | ICD-10-CM | POA: Diagnosis present

## 2021-02-02 DIAGNOSIS — M961 Postlaminectomy syndrome, not elsewhere classified: Secondary | ICD-10-CM | POA: Diagnosis present

## 2021-02-02 DIAGNOSIS — I5033 Acute on chronic diastolic (congestive) heart failure: Secondary | ICD-10-CM | POA: Diagnosis present

## 2021-02-02 DIAGNOSIS — E1169 Type 2 diabetes mellitus with other specified complication: Secondary | ICD-10-CM | POA: Diagnosis not present

## 2021-02-02 DIAGNOSIS — I10 Essential (primary) hypertension: Secondary | ICD-10-CM | POA: Diagnosis present

## 2021-02-02 DIAGNOSIS — E669 Obesity, unspecified: Secondary | ICD-10-CM | POA: Diagnosis present

## 2021-02-02 DIAGNOSIS — Z950 Presence of cardiac pacemaker: Secondary | ICD-10-CM | POA: Diagnosis not present

## 2021-02-02 DIAGNOSIS — Z7901 Long term (current) use of anticoagulants: Secondary | ICD-10-CM | POA: Diagnosis not present

## 2021-02-02 DIAGNOSIS — Z79899 Other long term (current) drug therapy: Secondary | ICD-10-CM

## 2021-02-02 DIAGNOSIS — I484 Atypical atrial flutter: Secondary | ICD-10-CM | POA: Diagnosis not present

## 2021-02-02 DIAGNOSIS — Z96652 Presence of left artificial knee joint: Secondary | ICD-10-CM | POA: Diagnosis present

## 2021-02-02 DIAGNOSIS — I495 Sick sinus syndrome: Secondary | ICD-10-CM | POA: Diagnosis present

## 2021-02-02 DIAGNOSIS — Z9861 Coronary angioplasty status: Secondary | ICD-10-CM

## 2021-02-02 DIAGNOSIS — Z794 Long term (current) use of insulin: Secondary | ICD-10-CM | POA: Diagnosis not present

## 2021-02-02 DIAGNOSIS — E1122 Type 2 diabetes mellitus with diabetic chronic kidney disease: Secondary | ICD-10-CM | POA: Diagnosis present

## 2021-02-02 DIAGNOSIS — I509 Heart failure, unspecified: Secondary | ICD-10-CM

## 2021-02-02 DIAGNOSIS — G4733 Obstructive sleep apnea (adult) (pediatric): Secondary | ICD-10-CM | POA: Diagnosis present

## 2021-02-02 DIAGNOSIS — Z6833 Body mass index (BMI) 33.0-33.9, adult: Secondary | ICD-10-CM | POA: Diagnosis not present

## 2021-02-02 DIAGNOSIS — Z7984 Long term (current) use of oral hypoglycemic drugs: Secondary | ICD-10-CM

## 2021-02-02 DIAGNOSIS — I4819 Other persistent atrial fibrillation: Secondary | ICD-10-CM | POA: Diagnosis present

## 2021-02-02 DIAGNOSIS — N1831 Chronic kidney disease, stage 3a: Secondary | ICD-10-CM | POA: Diagnosis not present

## 2021-02-02 DIAGNOSIS — I442 Atrioventricular block, complete: Secondary | ICD-10-CM | POA: Diagnosis not present

## 2021-02-02 DIAGNOSIS — E782 Mixed hyperlipidemia: Secondary | ICD-10-CM | POA: Diagnosis present

## 2021-02-02 DIAGNOSIS — N184 Chronic kidney disease, stage 4 (severe): Secondary | ICD-10-CM | POA: Diagnosis present

## 2021-02-02 LAB — BASIC METABOLIC PANEL
Anion gap: 7 (ref 5–15)
BUN: 40 mg/dL — ABNORMAL HIGH (ref 8–23)
CO2: 25 mmol/L (ref 22–32)
Calcium: 9.4 mg/dL (ref 8.9–10.3)
Chloride: 102 mmol/L (ref 98–111)
Creatinine, Ser: 2.04 mg/dL — ABNORMAL HIGH (ref 0.61–1.24)
GFR, Estimated: 33 mL/min — ABNORMAL LOW (ref >60)
Glucose, Bld: 124 mg/dL — ABNORMAL HIGH (ref 70–99)
Potassium: 4.7 mmol/L (ref 3.5–5.1)
Sodium: 134 mmol/L — ABNORMAL LOW (ref 135–145)

## 2021-02-02 LAB — CBC
HCT: 34.8 % — ABNORMAL LOW (ref 39.0–52.0)
Hemoglobin: 11.2 g/dL — ABNORMAL LOW (ref 13.0–17.0)
MCH: 31.2 pg (ref 26.0–34.0)
MCHC: 32.2 g/dL (ref 30.0–36.0)
MCV: 96.9 fL (ref 80.0–100.0)
Platelets: 88 10*3/uL — ABNORMAL LOW (ref 150–400)
RBC: 3.59 MIL/uL — ABNORMAL LOW (ref 4.22–5.81)
RDW: 14.3 % (ref 11.5–15.5)
WBC: 5.8 10*3/uL (ref 4.0–10.5)
nRBC: 0 % (ref 0.0–0.2)

## 2021-02-02 LAB — RESP PANEL BY RT-PCR (FLU A&B, COVID) ARPGX2
Influenza A by PCR: NEGATIVE
Influenza B by PCR: NEGATIVE
SARS Coronavirus 2 by RT PCR: NEGATIVE

## 2021-02-02 LAB — BRAIN NATRIURETIC PEPTIDE: B Natriuretic Peptide: 185.1 pg/mL — ABNORMAL HIGH (ref 0.0–100.0)

## 2021-02-02 MED ORDER — ACETAMINOPHEN 650 MG RE SUPP: 650.0000 mg | Freq: Four times a day (QID) | RECTAL | Status: DC | PRN

## 2021-02-02 MED ORDER — FUROSEMIDE 10 MG/ML IJ SOLN: 40.0000 mg | Freq: Two times a day (BID) | INTRAMUSCULAR | Status: DC

## 2021-02-02 MED ORDER — AMIODARONE HCL 200 MG PO TABS
200.0000 mg | ORAL_TABLET | Freq: Every day | ORAL | Status: DC
  Administered 2021-02-03 – 2021-02-04 (×2): 200 mg via ORAL
  Filled 2021-02-02 (×2): qty 1

## 2021-02-02 MED ORDER — POLYETHYLENE GLYCOL 3350 17 G PO PACK: 17.0000 g | PACK | Freq: Every day | ORAL | Status: DC | PRN

## 2021-02-02 MED ORDER — LOSARTAN POTASSIUM 50 MG PO TABS
100.0000 mg | ORAL_TABLET | Freq: Every day | ORAL | Status: DC
  Administered 2021-02-03 – 2021-02-04 (×2): 100 mg via ORAL
  Filled 2021-02-02 (×2): qty 2

## 2021-02-02 MED ORDER — FUROSEMIDE 10 MG/ML IJ SOLN
40.0000 mg | Freq: Two times a day (BID) | INTRAMUSCULAR | Status: DC
  Administered 2021-02-03 (×2): 40 mg via INTRAVENOUS
  Filled 2021-02-02 (×2): qty 4

## 2021-02-02 MED ORDER — ENOXAPARIN SODIUM 40 MG/0.4ML IJ SOSY
40.0000 mg | PREFILLED_SYRINGE | Freq: Every day | INTRAMUSCULAR | Status: DC
  Administered 2021-02-03 (×2): 40 mg via SUBCUTANEOUS
  Filled 2021-02-02 (×2): qty 0.4

## 2021-02-02 MED ORDER — ACETAMINOPHEN 325 MG PO TABS: 650.0000 mg | ORAL_TABLET | Freq: Four times a day (QID) | ORAL | Status: DC | PRN

## 2021-02-02 MED ORDER — AMLODIPINE BESYLATE 10 MG PO TABS
10.0000 mg | ORAL_TABLET | Freq: Every day | ORAL | Status: DC
  Administered 2021-02-03: 09:00:00 10 mg via ORAL
  Filled 2021-02-02: qty 1

## 2021-02-02 MED ORDER — INSULIN ASPART 100 UNIT/ML IJ SOLN
0.0000 [IU] | Freq: Three times a day (TID) | INTRAMUSCULAR | Status: DC
  Administered 2021-02-03: 05:00:00 2 [IU] via SUBCUTANEOUS
  Administered 2021-02-03 – 2021-02-04 (×4): 3 [IU] via SUBCUTANEOUS
  Administered 2021-02-04: 16:00:00 2 [IU] via SUBCUTANEOUS

## 2021-02-02 MED ORDER — CARVEDILOL 25 MG PO TABS
25.0000 mg | ORAL_TABLET | Freq: Two times a day (BID) | ORAL | Status: DC
  Administered 2021-02-03 – 2021-02-04 (×4): 25 mg via ORAL
  Filled 2021-02-02 (×4): qty 1

## 2021-02-02 NOTE — H&P (Signed)
History and Physical   Alan [name] WUX:[id number] DOB: 01/10/1941 DOA: 02/02/2021  PCP: [name], MD   Patient coming from: PCP office  Chief Complaint: Volume overload  HPI: Alan [name] is a 80 y.o. male with medical history significant of CHF, anemia, CAD, complete heart block status post pacemaker, CKD, diabetes, hypertension, hyperlipidemia, OSA, A. fib, SSS, post polio syndrome presenting with shortness of breath from PCP office.  Patient has known history of CHF and for the past few weeks due to an increase in creatinine he is dose of Lasix had been down titrated.  Initially dose was reduced from 40 mg daily to 20 mg daily and then 10 mg daily, then every other day, and then held for this past week.  Since that time patient has had some increasing edema and a 20 pound weight gain.  Also endorses orthopnea.  He states that he has tried a couple doses of Lasix, 1 yesterday morning and yesterday evening due to shortness of breath.  Minimal improvement.  Denies fevers, chills, chest pain, Donnell pain, constipation, diarrhea, nausea, vomiting.   ED Course: Vital signs in the ED were stable.  Lab work-up showed BMP with sodium 134, BUN 40, creatinine stable at 2 from 1.76, glucose 124.  Lab work-up showed hemoglobin 11.2 up from baseline around 8 and platelets of 88 down from baseline around 140.  BNP elevated to 185.  Respiratory panel for flu and COVID-negative.  Chest x-ray showed no acute abnormality.  Did not receive any interventions in the ED.  Review of Systems: As per HPI otherwise all other systems reviewed and are negative.  Past Medical History:  Diagnosis Date   Anticoagulant long-term use    Arthritis    Arthropathy of left shoulder    Atypical mole    Biceps tendonitis, left    Bursitis of left shoulder    Cardiac pacemaker in situ    first insertion 2008;  genertor and new lead change 03-08-2012;   [employer]   CHB (complete heart block) ([place])    s/p   PPM 2008   Chronic back pain    CKD (chronic kidney disease), stage III [facility])    Coronary artery disease cardiologist--- dr croitoru   hx  CABG x2  1990 and re-do 2004;  multiple PCI to [place] to 2004;    cardiac cath 2012  occluded LAD and RCA with patent grafts   Difficult intubation    " Dr. Sallyanne Kuster said that it was difficulty to get the tube in."   History of coronary angioplasty    multiple PCI to RCA ,  1990 to 2004   History of GI bleed followed by GI-- Olevia Perches PA @ [facility] in [place]   01/ 2020  upper and lower GI bleed ;  s/p  EGD with cautery of jejunal bleed and blood transfusion's   History of kidney stones    Hx of echocardiogram 06/29/2008   EF 45-50%    Hypertension    Ischemic cardiomyopathy    previously reported , ef 40-45%;  2010--- ef 45-50%;   last echo 2017 55-60%   Mixed hyperlipidemia    Nocturia    OSA on CPAP    cpap, 12, last sleep study Jan2013, sees Dr. Keturah Barre   PAF (paroxysmal atrial fibrillation) ([place])    long hx PAF--- followed by dr croitoru   Post-polio syndrome    dx polio age 69;   09-30-2018  per pt  occasional gait issues and occasion fall   Presence of permanent cardiac pacemaker    Pseudoarthrosis of lumbar spine    Rotator cuff tear, left    S/P CABG x 2    1990 x2  and 2004  x2   Sinus node dysfunction ([place])    2015--- sinus node arrest  with intact AV conduction   Squamous cell carcinoma of skin    Type 2 diabetes mellitus ([place])    followed by pcp    Past Surgical History:  Procedure Laterality Date   APPENDECTOMY  child   APPLICATION OF INTRAOPERATIVE CT SCAN N/A 07/20/2020   Procedure: APPLICATION OF INTRAOPERATIVE CT SCAN;  Surgeon: [name], Theodoro Doing, DO;  Location: [place];  Service: Neurosurgery;  Laterality: N/A;   BLEPHAROPLASTY Bilateral 12/2014   upper eyelid   CARDIAC PACEMAKER PLACEMENT  2008   CARDIOVERSION N/A 01/15/2019   Procedure: CARDIOVERSION;  Surgeon: Sanda Klein, MD;  Location: [place]  ENDOSCOPY;  Service: Cardiovascular;  Laterality: N/A;   CARPAL TUNNEL RELEASE Bilateral 2015;  2016   CATARACT EXTRACTION W/ INTRAOCULAR LENS  IMPLANT, BILATERAL  2017   CORONARY ANGIOPLASTY  1990  to 2004   multiple to [place]    @ [place] (dr chitwood)   seq. LIMA to LAD and Diagonal   CORONARY ARTERY BYPASS GRAFT  2004     dr Amador Cunas   SVG to  RCA and PDA;  ALSO MAZE  PROCEDURE   ESOPHAGOGASTRODUODENOSCOPY Left 03/18/2013   Procedure: ESOPHAGOGASTRODUODENOSCOPY (EGD);  Surgeon: Cleotis Nipper, MD;  Location: [place] ENDOSCOPY;  Service: Endoscopy;  Laterality: Left;   HARDWARE REMOVAL N/A 05/18/2016   Procedure: Removal of broken hardware Lumbar three-four;  Surgeon: [name], MD;  Location: [facility];  Service: Neurosurgery;  Laterality: N/A;   HERNIA REPAIR     LEFT HEART CATH AND CORS/GRAFTS ANGIOGRAPHY N/A 12/18/2018   Procedure: LEFT HEART CATH AND CORS/GRAFTS ANGIOGRAPHY;  Surgeon: Wellington Hampshire, MD;  Location: [place] CV LAB;  Service: Cardiovascular;  Laterality: N/A;   LEFT HEART CATHETERIZATION WITH CORONARY/GRAFT ANGIOGRAM N/A 12/21/2010   Procedure: LEFT HEART CATHETERIZATION WITH Beatrix Fetters;  Surgeon: Leonie Man, MD;  Location: [facility] CATH LAB;  Service: Cardiovascular;  Laterality: N/A;   LEG SURGERY Right 2002  approx.   lengthened his leg   LUMBAR LAMINECTOMY/DECOMPRESSION MICRODISCECTOMY  01/10/2012   Procedure: LUMBAR LAMINECTOMY/DECOMPRESSION MICRODISCECTOMY 1 LEVEL;  Surgeon: [name], MD;  Location: [place] NEURO ORS;  Service: Neurosurgery;  Laterality: Bilateral;  Lumbar three-four decompression, Posterior lateral fusion lumbar three-four, posterior spinus plate lumbar three-four   LUMBAR LAMINECTOMY/DECOMPRESSION MICRODISCECTOMY Left 05/18/2016   Procedure: Laminectomy and Foraminotomy - Lumbar two-lumbar three- Lumbar four-lumbar five- left with removal hardware lumbar three-four;  Surgeon: [name], MD;   Location: [place];  Service: Neurosurgery;  Laterality: Left;   NASAL SEPTUM SURGERY  yrs ago   PACEMAKER REVISION N/A 03/07/2012   Procedure: PACEMAKER REVISION;  Surgeon: Sanda Klein, MD;  Location: [place] CATH LAB;  Service: Cardiovascular;  Laterality: N/A;   POSTERIOR LUMBAR FUSION  02-22-2017   dr Ronnald Ramp  @ [facility]   re-do laminectomy L2-3 and fusion   POSTERIOR LUMBAR FUSION  03-11-2018   dr Ronnald Ramp @ [facility]   laminectomy L4-5;  fixation L2-S1 and fusion L3-S1   SACROILIAC JOINT FUSION N/A 07/20/2020   Procedure: Sacroiliac Joint Fusion;  Surgeon: Karsten Ro, DO;  Location: [place];  Service:  Neurosurgery;  Laterality: N/A;   SHOULDER ARTHROSCOPY Right after 2016, pt unsure year   SHOULDER ARTHROSCOPY WITH OPEN ROTATOR CUFF REPAIR Right 03/30/2014   Procedure: RIGHT SHOULDER ARTHROSCOPY  OPEN ROTATOR CUFF REPAIR;  Surgeon: Kerin Salen, MD;  Location: [place];  Service: Orthopedics;  Laterality: Right;   SHOULDER ARTHROSCOPY WITH SUBACROMIAL DECOMPRESSION, ROTATOR CUFF REPAIR AND BICEP TENDON REPAIR Left 10/01/2018   Procedure: LEFT SHOULDER ARTHROSCOPY, DISTAL CLAVICLE EXCISION,SUBACROMIAL, DECOMPRESSION, RORATOR CUFF REPAIR, BICEPS TENODESIS;  Surgeon: Renette Butters, MD;  Location: [place];  Service: Orthopedics;  Laterality: Left;   SHOULDER OPEN ROTATOR CUFF REPAIR Right 03/30/2014   Procedure: ROTATOR CUFF REPAIR SHOULDER OPEN;  Surgeon: Kerin Salen, MD;  Location: [place];  Service: Orthopedics;  Laterality: Right;   TEE WITHOUT CARDIOVERSION N/A 01/15/2019   Procedure: TRANSESOPHAGEAL ECHOCARDIOGRAM (TEE);  Surgeon: Sanda Klein, MD;  Location: [facility] ENDOSCOPY;  Service: Cardiovascular;  Laterality: N/A;   TONSILLECTOMY  child   TOTAL KNEE ARTHROPLASTY Left 2006    Social History  reports that he has never smoked. He has never used smokeless tobacco. He reports that he does not currently use alcohol. He reports that he does not use drugs.  No Known Allergies  Family  History  Problem Relation Age of Onset   Other Father        MVA   Cancer Mother   Reviewed on admission  Prior to Admission medications   Medication Sig Start Date End Date Taking? Authorizing Provider  furosemide (LASIX) 20 MG tablet Take 20 mg by mouth as needed.   Yes [provider]  amiodarone (PACERONE) 200 MG tablet TAKE 2 TABLETS BY MOUTH ONCE DAILY FOR 14 DAYS, THEN TAKE 1 TABLET ONCE DAILY 10/20/20   [name], Mihai, MD  amLODipine (NORVASC) 10 MG tablet Take 10 mg by mouth daily.  04/23/17   [provider]  carvedilol (COREG) 25 MG tablet Take 1 tablet (25 mg total) by mouth 2 (two) times daily with a meal. 08/27/20   Alma Friendly, MD  ferrous sulfate 325 (65 FE) MG tablet Take 1 tablet (325 mg total) by mouth daily with breakfast. 08/28/20 09/27/20  Alma Friendly, MD  Fluorouracil (TOLAK) 4 % CREA Apply to affected area nightly x 2 weeks 11/09/20   Robyne Askew R, PA-C  glipiZIDE (GLUCOTROL) 10 MG tablet Take 10 mg by mouth 2 (two) times daily.    [provider]  HYDROcodone-acetaminophen (NORCO) 10-325 MG tablet Take 1 tablet by mouth every 4 (four) hours as needed for moderate pain ((score 4 to 6)). 07/22/20   [name], Troy C, DO  insulin lispro (HUMALOG) 100 UNIT/ML injection Inject 2-8 Units into the skin as needed for high blood sugar.     [provider]  losartan (COZAAR) 100 MG tablet Take 100 mg by mouth daily.     [provider]  metFORMIN (GLUCOPHAGE) 500 MG tablet Take 500 mg by mouth 2 (two) times daily with a meal. Takes with 500mg  XR    [provider]  metFORMIN (GLUCOPHAGE-XR) 500 MG 24 hr tablet Take 500 mg by mouth 2 (two) times daily. 07/01/20   [provider]  Multiple Vitamin (MULTIVITAMIN WITH MINERALS) TABS tablet Take 1 tablet by mouth daily.     [provider]  pantoprazole (PROTONIX) 40 MG tablet Take 1 tablet (40 mg total) by mouth 2 (two) times daily. 08/27/20 09/26/20   Alma Friendly, MD  Potassium Chloride ER  20 MEQ TBCR Take 1 tablet by mouth once daily 07/01/20   [name], Mihai, MD  torsemide (DEMADEX) 20 MG tablet Take 1 tablet (20 mg total) by mouth daily. 08/28/20 09/27/20  Alma Friendly, MD    Physical Exam: Vitals:   02/02/21 1930 02/02/21 1945 02/02/21 2005 02/02/21 2100  BP: (!) 146/58  (!) 146/65 139/69  Pulse: 61 (!) 59 61 60  Resp: 14 20 16 15   Temp:   98.2 F (36.8 C)   TempSrc:   Oral   SpO2: 99% 98% 97% 98%  Weight:      Height:       Physical Exam Constitutional:      General: He is not in acute distress.    Appearance: Normal appearance.  HENT:     Head: Normocephalic and atraumatic.     Mouth/Throat:     Mouth: Mucous membranes are moist.     Pharynx: Oropharynx is clear.  Eyes:     Extraocular Movements: Extraocular movements intact.     Pupils: Pupils are equal, round, and reactive to light.  Cardiovascular:     Rate and Rhythm: Normal rate and regular rhythm.     Pulses: Normal pulses.     Heart sounds: Normal heart sounds.  Pulmonary:     Effort: Pulmonary effort is normal. No respiratory distress.     Breath sounds: Normal breath sounds.  Abdominal:     General: Bowel sounds are normal. There is no distension.     Palpations: Abdomen is soft.     Tenderness: There is no abdominal tenderness.  Musculoskeletal:        General: No swelling or deformity.     Right lower leg: Edema present.     Left lower leg: Edema present.  Skin:    General: Skin is warm and dry.  Neurological:     General: No focal deficit present.     Mental Status: Mental status is at baseline.   Labs on Admission: I have personally reviewed following labs and imaging studies  CBC: Recent Labs  Lab 02/02/21 1436  WBC 5.8  HGB 11.2*  HCT 34.8*  MCV 96.9  PLT 88*    Basic Metabolic Panel: Recent Labs  Lab 02/02/21 1436  NA 134*  K 4.7  CL 102  CO2 25  GLUCOSE 124*  BUN 40*  CREATININE 2.04*  CALCIUM 9.4     GFR: Estimated Creatinine Clearance: 35.6 mL/min (A) (by C-G formula based on SCr of 2.04 mg/dL (H)).  Liver Function Tests: No results for input(s): AST, ALT, ALKPHOS, BILITOT, PROT, ALBUMIN in the last 168 hours.  Urine analysis:    Component Value Date/Time   COLORURINE YELLOW 03/17/2013 1438   APPEARANCEUR CLEAR 03/17/2013 1438   LABSPEC 1.016 03/17/2013 1438   PHURINE 5.0 03/17/2013 1438   GLUCOSEU NEGATIVE 03/17/2013 1438   HGBUR NEGATIVE 03/17/2013 1438   BILIRUBINUR NEGATIVE 03/17/2013 1438   KETONESUR NEGATIVE 03/17/2013 1438   PROTEINUR NEGATIVE 03/17/2013 1438   UROBILINOGEN 0.2 03/17/2013 1438   NITRITE NEGATIVE 03/17/2013 1438   LEUKOCYTESUR NEGATIVE 03/17/2013 1438    Radiological Exams on Admission: DG Chest 2 View  Result Date: 02/02/2021 CLINICAL DATA:  Congestive heart failure. EXAM: CHEST - 2 VIEW COMPARISON:  August 23, 2020. FINDINGS: Stable cardiomegaly. Left-sided pacemaker is unchanged in position. Sternotomy wires are noted. Both lungs are clear. The visualized skeletal structures are unremarkable. IMPRESSION: No active cardiopulmonary disease. Electronically Signed   By: Marijo Conception  M.D.   On: 02/02/2021 14:46    EKG: Independently reviewed.  Atrial paced rhythm at 74 bpm.  Assessment/Plan Principal Problem:   Acute exacerbation of CHF (congestive heart failure) (HCC) Active Problems:   Diabetes mellitus type 2 in obese (HCC)   Persistent atrial fibrillation (HCC)   CAD s/p redo CABG 2004 with stable angina pectoris (HCC)   SSS (sick sinus syndrome) ([place])   Essential hypertension   Dyslipidemia   CHB (complete heart block) (HCC)   CKD (chronic kidney disease), stage III (HCC)   Coronary artery disease of bypass graft of native heart with stable angina pectoris (HCC)   Atypical atrial flutter (HCC)  CHF exacerbation > Last echocardiogram was in July of this year with EF 50-55% with moderate reduced RV function. > Patient presenting with  signs and symptoms of CHF exacerbation with edema and weight gain and orthopnea in the setting of down titration of outpatient Lasix due to issues with renal function. > He had taken some Lasix despite this but continues to have lack of response to the p.o. Lasix. > We will start on twice daily Lasix now that patient is admitted. - Monitor on telemetry - 40 mg IV Lasix twice daily - Trend renal function electrolytes - Check magnesium - Strict ins and outs, daily weights  Atrial fibrillation SSS Complete heart block > Status post pacemaker - Continue home carvedilol and amiodarone  CAD Hyperlipidemia > Status post CABG and redo CABG - Continue home losartan, carvedilol  Hypertension -Continue home amlodipine, carvedilol, losartan  CKD > Creatinine near baseline which is in the 1.7's at 2 in the ED. - Trend renal function and electrolytes - Avoid nephrotoxic agents  Diabetes - SSI   DVT prophylaxis: Lovenox  Code Status:   Full  Family Communication:  None on admission.  Patient states his family is up-to-date and he has notified them that he mated to [facility]. Disposition Plan:   Patient is from:  Home  Anticipated DC to:  Home  Anticipated DC date:  1 to 3 days  Anticipated DC barriers: None  Consults called:  None  Admission status:  Inpatient, telemetry   Severity of Illness: The appropriate patient status for this patient is INPATIENT. Inpatient status is judged to be reasonable and necessary in order to provide the required intensity of service to ensure the patient's safety. The patient's presenting symptoms, physical exam findings, and initial radiographic and laboratory data in the context of their chronic comorbidities is felt to place them at high risk for further clinical deterioration. Furthermore, it is not anticipated that the patient will be medically stable for discharge from the hospital within 2 midnights of admission.   * I certify that at the point of  admission it is my clinical judgment that the patient will require inpatient hospital care spanning beyond 2 midnights from the point of admission due to high intensity of service, high risk for further deterioration and high frequency of surveillance required.Marcelyn Bruins MD [facility]  How to contact the [facility] Attending or Consulting provider [place] or covering provider during after hours [place], for this patient?   Check the care team in [facility] and look for a) attending/consulting [facility] provider listed and b) the [facility] team listed Log into [email or web address] and use [place]'s universal password to access. If you do not have the password, please contact the hospital operator. Locate the [facility] provider you are looking for under [facility]  and page to a number that you can be directly reached. If you still have difficulty reaching the provider, please page the [facility] (Director on Call) for the Hospitalists listed on amion for assistance.  02/02/2021, 10:48 PM

## 2021-02-02 NOTE — ED Notes (Signed)
Lab will add on BNP

## 2021-02-02 NOTE — ED Provider Notes (Addendum)
[facility] EMERGENCY DEPT Provider Note   CSN: [id number] Arrival date & time: 02/02/21  1410     History Chief Complaint  Patient presents with   Congestive Heart Failure    Alan [name] is a 80 y.o. male.   Congestive Heart Failure Associated symptoms include shortness of breath. Pertinent negatives include no abdominal pain. Patient sent in from [facility] for admission.  History of CHF.  Over the last few weeks patient has had elevating creatinine.  Has had decreasing of his Lasix because of it.  Had gone from 40 mg to 20 and then down to none.  Has had worsening swelling.  Weight has gone from 210 up to 230 pounds.  Patient states he can only sleep an hour and a half last night because he could not lay back.  No chest pain.  Creatinine was 1.7 about a month ago.  Now up to 2.  Has previously gone higher with attempted diuresis.  No fevers.     Past Medical History:  Diagnosis Date   Anticoagulant long-term use    Arthritis    Arthropathy of left shoulder    Atypical mole    Biceps tendonitis, left    Bursitis of left shoulder    Cardiac pacemaker in situ    first insertion 2008;  genertor and new lead change 03-08-2012;   Medtronic   CHB (complete heart block) ([place])    s/p  PPM 2008   Chronic back pain    CKD (chronic kidney disease), stage III [facility])    Coronary artery disease cardiologist--- dr croitoru   hx  CABG x2  1990 and re-do 2004;  multiple PCI to [place] to 2004;    cardiac cath 2012  occluded LAD and RCA with patent grafts   Difficult intubation    " Dr. Sallyanne Kuster said that it was difficulty to get the tube in."   History of coronary angioplasty    multiple PCI to RCA ,  1990 to 2004   History of GI bleed followed by GI-- Olevia Perches PA @ [facility] in [place]   01/ 2020  upper and lower GI bleed ;  s/p  EGD with cautery of jejunal bleed and blood transfusion's   History of kidney stones    Hx of echocardiogram 06/29/2008   EF  45-50%    Hypertension    Ischemic cardiomyopathy    previously reported , ef 40-45%;  2010--- ef 45-50%;   last echo 2017 55-60%   Mixed hyperlipidemia    Nocturia    OSA on CPAP    cpap, 12, last sleep study Jan2013, sees Dr. Keturah Barre   PAF (paroxysmal atrial fibrillation) ([place])    long hx PAF--- followed by dr croitoru   Post-polio syndrome    dx polio age 24;   09-30-2018  per pt occasional gait issues and occasion fall   Presence of permanent cardiac pacemaker    Pseudoarthrosis of lumbar spine    Rotator cuff tear, left    S/P CABG x 2    1990 x2  and 2004  x2   Sinus node dysfunction ([place])    2015--- sinus node arrest  with intact AV conduction   Squamous cell carcinoma of skin    Type 2 diabetes mellitus ([place])    followed by pcp    Patient Active Problem List   Diagnosis Date Noted   CHF exacerbation ([place]) 08/23/2020   Sacroiliitis ([place]) 07/20/2020   Preoperative  clearance 03/12/2019   Long term (current) use of anticoagulants 12/27/2018   Angina pectoris (HCC)    Atypical atrial flutter (HCC)    S/P lumbar fusion 03/11/2018   Preop pulmonary/respiratory exam 02/26/2018   Coronary artery disease of bypass graft of native heart with stable angina pectoris ([place]) 06/29/2017   Leg length difference, acquired 06/26/2016   S/P lumbar spinal fusion 05/18/2016   Idiopathic chronic gout of multiple sites with tophus 05/01/2016   Idiopathic chronic gout of right foot without tophus 04/05/2016   Tophaceous gout 04/05/2016   Mild obesity 03/16/2016   Recurrent left inguinal hernia 03/16/2016   Allergic rhinitis 09/30/2015   Acute confusional state 09/27/2015   Memory loss 09/27/2015   Right inguinal hernia 07/17/2014   Ventral hernia without obstruction or gangrene 07/17/2014   Acute blood loss anemia 07/04/2013   CKD (chronic kidney disease), stage III ([place]) 07/04/2013   GI bleed 03/19/2013   Melena 03/17/2013   CHB (complete heart block) ([place]) 10/13/2012    Dyslipidemia 07/11/2012   Preop cardiovascular exam 07/11/2012   Ischemic cardiomyopathy 07/11/2012   Myocardial ischemia 07/11/2012   Pacemaker - Medtronic Adapta dual chamber 03/08/2012   Non-sustained ventricular tachycardia 12/21/2010   Essential hypertension 12/21/2010   Paroxysmal ventricular tachycardia 12/21/2010   SSS (sick sinus syndrome) ([place])    Hyperlipidemia 11/03/2010   Restrictive lung disease 12/23/2009   POST-POLIO SYNDROME 12/12/2007   Diabetes mellitus type 2 in obese ([place]) 12/12/2007   Obstructive sleep apnea 12/12/2007   Coronary atherosclerosis 12/12/2007   Persistent atrial fibrillation ([place]) 12/12/2007   CAD s/p redo CABG 2004 with stable angina pectoris ([place]) 12/12/2007   Status post aorto-coronary artery bypass graft 12/12/2007    Past Surgical History:  Procedure Laterality Date   APPENDECTOMY  child   APPLICATION OF INTRAOPERATIVE CT SCAN N/A 07/20/2020   Procedure: APPLICATION OF INTRAOPERATIVE CT SCAN;  Surgeon: Karsten Ro, DO;  Location: [facility];  Service: Neurosurgery;  Laterality: N/A;   BLEPHAROPLASTY Bilateral 12/2014   upper eyelid   CARDIAC PACEMAKER PLACEMENT  2008   CARDIOVERSION N/A 01/15/2019   Procedure: CARDIOVERSION;  Surgeon: Sanda Klein, MD;  Location: [facility] ENDOSCOPY;  Service: Cardiovascular;  Laterality: N/A;   CARPAL TUNNEL RELEASE Bilateral 2015;  2016   CATARACT EXTRACTION W/ INTRAOCULAR LENS  IMPLANT, BILATERAL  2017   CORONARY ANGIOPLASTY  1990  to 2004   multiple to [place]    @ [place] (dr chitwood)   seq. LIMA to LAD and Diagonal   CORONARY ARTERY BYPASS GRAFT  2004     dr Amador Cunas   SVG to  RCA and PDA;  ALSO MAZE  PROCEDURE   ESOPHAGOGASTRODUODENOSCOPY Left 03/18/2013   Procedure: ESOPHAGOGASTRODUODENOSCOPY (EGD);  Surgeon: Cleotis Nipper, MD;  Location: [facility] ENDOSCOPY;  Service: Endoscopy;  Laterality: Left;   HARDWARE REMOVAL N/A 05/18/2016   Procedure: Removal of broken  hardware Lumbar three-four;  Surgeon: [name], MD;  Location: [place];  Service: Neurosurgery;  Laterality: N/A;   HERNIA REPAIR     LEFT HEART CATH AND CORS/GRAFTS ANGIOGRAPHY N/A 12/18/2018   Procedure: LEFT HEART CATH AND CORS/GRAFTS ANGIOGRAPHY;  Surgeon: Wellington Hampshire, MD;  Location: [facility] CV LAB;  Service: Cardiovascular;  Laterality: N/A;   LEFT HEART CATHETERIZATION WITH CORONARY/GRAFT ANGIOGRAM N/A 12/21/2010   Procedure: LEFT HEART CATHETERIZATION WITH Beatrix Fetters;  Surgeon: Leonie Man, MD;  Location: [facility] CATH LAB;  Service: Cardiovascular;  Laterality: N/A;   LEG SURGERY Right 2002  approx.   lengthened his leg   LUMBAR LAMINECTOMY/DECOMPRESSION MICRODISCECTOMY  01/10/2012   Procedure: LUMBAR LAMINECTOMY/DECOMPRESSION MICRODISCECTOMY 1 LEVEL;  Surgeon: [name], MD;  Location: [facility] NEURO ORS;  Service: Neurosurgery;  Laterality: Bilateral;  Lumbar three-four decompression, Posterior lateral fusion lumbar three-four, posterior spinus plate lumbar three-four   LUMBAR LAMINECTOMY/DECOMPRESSION MICRODISCECTOMY Left 05/18/2016   Procedure: Laminectomy and Foraminotomy - Lumbar two-lumbar three- Lumbar four-lumbar five- left with removal hardware lumbar three-four;  Surgeon: [name], MD;  Location: [facility];  Service: Neurosurgery;  Laterality: Left;   NASAL SEPTUM SURGERY  yrs ago   PACEMAKER REVISION N/A 03/07/2012   Procedure: PACEMAKER REVISION;  Surgeon: Sanda Klein, MD;  Location: [facility] CATH LAB;  Service: Cardiovascular;  Laterality: N/A;   POSTERIOR LUMBAR FUSION  02-22-2017   dr Ronnald Ramp  @ [facility]   re-do laminectomy L2-3 and fusion   POSTERIOR LUMBAR FUSION  03-11-2018   dr Ronnald Ramp @ [facility]   laminectomy L4-5;  fixation L2-S1 and fusion L3-S1   SACROILIAC JOINT FUSION N/A 07/20/2020   Procedure: Sacroiliac Joint Fusion;  Surgeon: Karsten Ro, DO;  Location: [facility];  Service: Neurosurgery;  Laterality: N/A;   SHOULDER ARTHROSCOPY Right after 2016, pt unsure  year   SHOULDER ARTHROSCOPY WITH OPEN ROTATOR CUFF REPAIR Right 03/30/2014   Procedure: RIGHT SHOULDER ARTHROSCOPY  OPEN ROTATOR CUFF REPAIR;  Surgeon: Kerin Salen, MD;  Location: [facility];  Service: Orthopedics;  Laterality: Right;   SHOULDER ARTHROSCOPY WITH SUBACROMIAL DECOMPRESSION, ROTATOR CUFF REPAIR AND BICEP TENDON REPAIR Left 10/01/2018   Procedure: LEFT SHOULDER ARTHROSCOPY, DISTAL CLAVICLE EXCISION,SUBACROMIAL, DECOMPRESSION, RORATOR CUFF REPAIR, BICEPS TENODESIS;  Surgeon: Renette Butters, MD;  Location: [facility];  Service: Orthopedics;  Laterality: Left;   SHOULDER OPEN ROTATOR CUFF REPAIR Right 03/30/2014   Procedure: ROTATOR CUFF REPAIR SHOULDER OPEN;  Surgeon: Kerin Salen, MD;  Location: [facility];  Service: Orthopedics;  Laterality: Right;   TEE WITHOUT CARDIOVERSION N/A 01/15/2019   Procedure: TRANSESOPHAGEAL ECHOCARDIOGRAM (TEE);  Surgeon: Sanda Klein, MD;  Location[facility] ENDOSCOPY;  Service: Cardiovascular;  Laterality: N/A;   TONSILLECTOMY  child   TOTAL KNEE ARTHROPLASTY Left 2006       Family History  Problem Relation Age of Onset   Other Father        MVA   Cancer Mother     Social History   Tobacco Use   Smoking status: Never   Smokeless tobacco: Never  Vaping Use   Vaping Use: Never used  Substance Use Topics   Alcohol use: Not Currently   Drug use: Never    Home Medications Prior to Admission medications   Medication Sig Start Date End Date Taking? Authorizing Provider  furosemide (LASIX) 20 MG tablet Take 20 mg by mouth as needed.   Yes [provider]  amiodarone (PACERONE) 200 MG tablet TAKE 2 TABLETS BY MOUTH ONCE DAILY FOR 14 DAYS, THEN TAKE 1 TABLET ONCE DAILY 10/20/20   [name], Mihai, MD  amLODipine (NORVASC) 10 MG tablet Take 10 mg by mouth daily.  04/23/17   [provider]  carvedilol (COREG) 25 MG tablet Take 1 tablet (25 mg total) by mouth 2 (two) times daily with a meal. 08/27/20   Alma Friendly, MD   ferrous sulfate 325 (65 FE) MG tablet Take 1 tablet (325 mg total) by mouth daily with breakfast. 08/28/20 09/27/20  Alma Friendly, MD  Fluorouracil Tobe Sos)  4 % CREA Apply to affected area nightly x 2 weeks 11/09/20   Robyne Askew R, PA-C  glipiZIDE (GLUCOTROL) 10 MG tablet Take 10 mg by mouth 2 (two) times daily.    [provider]  HYDROcodone-acetaminophen (NORCO) 10-325 MG tablet Take 1 tablet by mouth every 4 (four) hours as needed for moderate pain ((score 4 to 6)). 07/22/20   [name], Troy C, DO  insulin lispro (HUMALOG) 100 UNIT/ML injection Inject 2-8 Units into the skin as needed for high blood sugar.     [provider]  losartan (COZAAR) 100 MG tablet Take 100 mg by mouth daily.     [provider]  metFORMIN (GLUCOPHAGE) 500 MG tablet Take 500 mg by mouth 2 (two) times daily with a meal. Takes with 500mg  XR    [provider]  metFORMIN (GLUCOPHAGE-XR) 500 MG 24 hr tablet Take 500 mg by mouth 2 (two) times daily. 07/01/20   [provider]  Multiple Vitamin (MULTIVITAMIN WITH MINERALS) TABS tablet Take 1 tablet by mouth daily.     [provider]  pantoprazole (PROTONIX) 40 MG tablet Take 1 tablet (40 mg total) by mouth 2 (two) times daily. 08/27/20 09/26/20  Alma Friendly, MD  Potassium Chloride ER 20 MEQ TBCR Take 1 tablet by mouth once daily 07/01/20   [name], Mihai, MD  torsemide (DEMADEX) 20 MG tablet Take 1 tablet (20 mg total) by mouth daily. 08/28/20 09/27/20  Alma Friendly, MD    Allergies    Patient has no known allergies.  Review of Systems   Review of Systems  Constitutional:  Negative for appetite change.  HENT:  Negative for congestion.   Respiratory:  Positive for shortness of breath.   Cardiovascular:  Positive for leg swelling.  Gastrointestinal:  Negative for abdominal pain.  Genitourinary:  Negative for flank pain.  Musculoskeletal:  Negative for back pain.  Neurological:  Negative for  weakness.   Physical Exam Updated Vital Signs BP 133/67    Pulse (!) 59    Temp 97.8 F (36.6 C)    Resp 14    Ht 5\' 10"  (1.778 m)    Wt 104.8 kg    SpO2 97%    BMI 33.15 kg/m   Physical Exam Vitals and nursing note reviewed.  HENT:     Head: Atraumatic.  Eyes:     Pupils: Pupils are equal, round, and reactive to light.  Cardiovascular:     Rate and Rhythm: Normal rate.  Pulmonary:     Effort: Pulmonary effort is normal.     Breath sounds: No rhonchi or rales.  Abdominal:     Tenderness: There is no abdominal tenderness.  Musculoskeletal:     Cervical back: Neck supple.     Right lower leg: Edema present.     Left lower leg: Edema present.  Skin:    General: Skin is warm.     Capillary Refill: Capillary refill takes less than 2 seconds.  Neurological:     Mental Status: He is alert and oriented to person, place, and time.    ED Results / Procedures / Treatments   Labs (all labs ordered are listed, but only abnormal results are displayed) Labs Reviewed  BASIC METABOLIC PANEL - Abnormal; Notable for the following components:      Result Value   Sodium 134 (*)    Glucose, Bld 124 (*)    BUN 40 (*)    Creatinine, Ser 2.04 (*)  GFR, Estimated 33 (*)    All other components within normal limits  CBC - Abnormal; Notable for the following components:   RBC 3.59 (*)    Hemoglobin 11.2 (*)    HCT 34.8 (*)    Platelets 88 (*)    All other components within normal limits  BRAIN NATRIURETIC PEPTIDE - Abnormal; Notable for the following components:   B Natriuretic Peptide 185.1 (*)    All other components within normal limits  RESP PANEL BY RT-PCR (FLU A&B, COVID) [id number]    EKG EKG Interpretation  Date/Time:  Wednesday February 02 2021 14:17:33 EST Ventricular Rate:  74 PR Interval:    QRS Duration: 168 QT Interval:  456 QTC Calculation: 506 R Axis:   -68 Text Interpretation: ATRIAL PACED RHYTHM Minimal voltage criteria for LVH, may be normal variant ( R in aVL )  Abnormal ECG Confirmed by Davonna Belling [phone]) on 02/02/2021 3:11:51 PM  Radiology DG Chest 2 View  Result Date: 02/02/2021 CLINICAL DATA:  Congestive heart failure. EXAM: CHEST - 2 VIEW COMPARISON:  August 23, 2020. FINDINGS: Stable cardiomegaly. Left-sided pacemaker is unchanged in position. Sternotomy wires are noted. Both lungs are clear. The visualized skeletal structures are unremarkable. IMPRESSION: No active cardiopulmonary disease. Electronically Signed   By: Marijo Conception M.D.   On: 02/02/2021 14:46    Procedures Procedures   Medications Ordered in ED Medications - No data to display  ED Course  I have reviewed the triage vital signs and the nursing notes.  Pertinent labs & imaging results that were available during my care of the patient were reviewed by me and considered in my medical decision making (see chart for details).    MDM Rules/Calculators/A&P                         Patient sent in for admission.  Has a history of CHF and they have been going down on his Lasix due to increasing kidney function.  It appears as if creatinine was 1.7 then went up to 2.  BUN is also increasing.  However has gone up about 20 pounds over the last 2 weeks.  Has had issues with his kidney function with diuresis.  States he was unable to sleep last night.  Chest x-ray reassuring but BNP is elevated.  Is lower than has been in the past hour.  However with increasing creatinine increasing BUN and increasing peripheral edema I feel that if he would benefit from mission the hospital for more diuresis.  Will discuss with hospitalist  Patient had still been taking some the Lasix despite having been told to be off it.  He was taking the Lasix because he continued to swell.  This appears to be a failure of the outpatient Lasix with his fluid    Final Clinical Impression(s) / ED Diagnoses Final diagnoses:  Congestive heart failure, unspecified HF chronicity, unspecified heart failure type  [facility])    Rx / DC Orders ED Discharge Orders     None        Davonna Belling, MD 02/02/21 1637    Davonna Belling, MD 02/02/21 [phone]

## 2021-02-02 NOTE — ED Notes (Signed)
Patient reports he has updated family on plan of care.  All personal belongings to be transported with patient to [facility]

## 2021-02-02 NOTE — ED Notes (Signed)
Patient transported to [facility] at this time via [facility]

## 2021-02-02 NOTE — Telephone Encounter (Signed)
Joni Reining, PA from [facility] called to make Dr. Loletha Grayer aware that she is sending Mr. Redder to [facility] ED  with Acute on Chronic Heart Failure

## 2021-02-02 NOTE — ED Notes (Signed)
Called [facility] to transport patient to [facility] room 13

## 2021-02-02 NOTE — ED Triage Notes (Addendum)
Pt states he went to his PCP for blood draw, was told to come here due to fluid around his heart. Weight from 210 approx 2 weeks ago to 231lb.

## 2021-02-03 ENCOUNTER — Inpatient Hospital Stay (HOSPITAL_COMMUNITY): Payer: Medicare HMO

## 2021-02-03 DIAGNOSIS — R0602 Shortness of breath: Secondary | ICD-10-CM

## 2021-02-03 DIAGNOSIS — E1169 Type 2 diabetes mellitus with other specified complication: Secondary | ICD-10-CM | POA: Diagnosis not present

## 2021-02-03 DIAGNOSIS — N1832 Chronic kidney disease, stage 3b: Secondary | ICD-10-CM

## 2021-02-03 DIAGNOSIS — I509 Heart failure, unspecified: Secondary | ICD-10-CM | POA: Diagnosis not present

## 2021-02-03 DIAGNOSIS — E669 Obesity, unspecified: Secondary | ICD-10-CM | POA: Diagnosis not present

## 2021-02-03 LAB — ECHOCARDIOGRAM COMPLETE
Area-P 1/2: 3.91 cm2
Calc EF: 59.4 %
Height: 70 in
S' Lateral: 3.8 cm
Single Plane A2C EF: 57.9 %
Single Plane A4C EF: 59.3 %
Weight: 3265.6 oz

## 2021-02-03 LAB — COMPREHENSIVE METABOLIC PANEL
ALT: 27 U/L (ref 0–44)
AST: 20 U/L (ref 15–41)
Albumin: 3.3 g/dL — ABNORMAL LOW (ref 3.5–5.0)
Alkaline Phosphatase: 90 U/L (ref 38–126)
Anion gap: 8 (ref 5–15)
BUN: 40 mg/dL — ABNORMAL HIGH (ref 8–23)
CO2: 22 mmol/L (ref 22–32)
Calcium: 8.9 mg/dL (ref 8.9–10.3)
Chloride: 102 mmol/L (ref 98–111)
Creatinine, Ser: 2.22 mg/dL — ABNORMAL HIGH (ref 0.61–1.24)
GFR, Estimated: 29 mL/min — ABNORMAL LOW (ref >60)
Glucose, Bld: 179 mg/dL — ABNORMAL HIGH (ref 70–99)
Potassium: 3.9 mmol/L (ref 3.5–5.1)
Sodium: 132 mmol/L — ABNORMAL LOW (ref 135–145)
Total Bilirubin: 0.4 mg/dL (ref 0.3–1.2)
Total Protein: 6.9 g/dL (ref 6.5–8.1)

## 2021-02-03 LAB — CBC
HCT: 31.7 % — ABNORMAL LOW (ref 39.0–52.0)
Hemoglobin: 10.1 g/dL — ABNORMAL LOW (ref 13.0–17.0)
MCH: 30.9 pg (ref 26.0–34.0)
MCHC: 31.9 g/dL (ref 30.0–36.0)
MCV: 96.9 fL (ref 80.0–100.0)
Platelets: 80 10*3/uL — ABNORMAL LOW (ref 150–400)
RBC: 3.27 MIL/uL — ABNORMAL LOW (ref 4.22–5.81)
RDW: 14.5 % (ref 11.5–15.5)
WBC: 5.4 10*3/uL (ref 4.0–10.5)
nRBC: 0 % (ref 0.0–0.2)

## 2021-02-03 LAB — GLUCOSE, CAPILLARY
Glucose-Capillary: 139 mg/dL — ABNORMAL HIGH (ref 70–99)
Glucose-Capillary: 174 mg/dL — ABNORMAL HIGH (ref 70–99)
Glucose-Capillary: 156 mg/dL — ABNORMAL HIGH (ref 70–99)
Glucose-Capillary: 144 mg/dL — ABNORMAL HIGH (ref 70–99)

## 2021-02-03 LAB — MAGNESIUM: Magnesium: 2 mg/dL (ref 1.7–2.4)

## 2021-02-03 MED ORDER — FUROSEMIDE 10 MG/ML IJ SOLN
40.0000 mg | Freq: Every day | INTRAMUSCULAR | Status: DC
  Administered 2021-02-04: 09:00:00 40 mg via INTRAVENOUS
  Filled 2021-02-03: qty 4

## 2021-02-03 NOTE — Progress Notes (Signed)
°  Echocardiogram 2D Echocardiogram has been performed.  Alan [name] 02/03/2021, 1:35 PM

## 2021-02-03 NOTE — Progress Notes (Signed)
Orthopedic Tech Progress Note Patient Details:  Alan [name] [birth year] [id number]  Ortho Devices Type of Ortho Device: Haematologist Ortho Device/Splint Location: BLE Ortho Device/Splint Interventions: Application, Ordered   Post Interventions Patient Tolerated: Well  [name] A [name] 02/03/2021, 3:03 PM

## 2021-02-03 NOTE — Plan of Care (Signed)
°  Problem: Education: Goal: Ability to verbalize understanding of medication therapies will improve Outcome: Progressing Goal: Individualized Educational Video(s) Outcome: Progressing   Problem: Cardiac: Goal: Ability to achieve and maintain adequate cardiopulmonary perfusion will improve Outcome: Progressing   Problem: Education: Goal: Knowledge of General Education information will improve Description: Including pain rating scale, medication(s)/side effects and non-pharmacologic comfort measures Outcome: Progressing

## 2021-02-03 NOTE — Progress Notes (Signed)
Progress Note    Alan [name]  DJS:[id number] DOB: 08/01/1940  DOA: 02/02/2021 PCP: [name], MD    Brief Narrative:     Medical records reviewed and are as summarized below:  Alan [name] is an 80 y.o. male with medical history significant of CHF, anemia, CAD, complete heart block status post pacemaker, CKD, diabetes, hypertension, hyperlipidemia, OSA, A. fib, SSS, post polio syndrome presenting with shortness of breath from PCP office. Patient has known history of CHF and for the past few weeks due to an increase in creatinine his dose of Lasix had been down titrated.  Initially dose was reduced from 40 mg daily to 20 mg daily and then 10 mg daily, then every other day, and then held for this past week.  Since that time patient has had some increasing edema and a 20 pound weight gain.  Also endorses orthopnea.  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 > Patient presenting with signs and symptoms of CHF exacerbation with edema and weight gain and orthopnea in the setting of down titration of outpatient Lasix due to issues with renal function. > He had taken some Lasix despite this but continues to have lack of response to the p.o. Lasix. -IV Lasix twice daily - Trend renal function/electrolytes - Strict ins and outs, daily weights -UNNA boots for LE edema    Atrial fibrillation SSS Complete heart block > Status post pacemaker - Continue home carvedilol and amiodarone  CAD Hyperlipidemia > Status post CABG and redo CABG - Continue home losartan, carvedilol   Hypertension -Continue home  carvedilol, losartan -held norvasc for LE edema -may need to hold ARB for worsening Cr  CKD 3B > Creatinine near baseline which is 1.7 - Trend renal function and electrolytes - Avoid nephrotoxic agents  Diabetes - SSI      Family Communication/Anticipated D/C date and plan/Code Status   DVT prophylaxis: Lovenox ordered. Code Status: Full Code.  Disposition Plan: Status is: Inpatient  Remains inpatient appropriate because: needs further diuresis         Medical Consultants:   None.    Subjective:   Continues with LE edema  Objective:    Vitals:   02/03/21 0601 02/03/21 0716 02/03/21 0908 02/03/21 1042  BP: 133/62 122/64 129/79 139/69  Pulse: 63 61 64 63  Resp: 18 15 16 14   Temp: 98 F (36.7 C) 98.1 F (36.7 C)  98 F (36.7 C)  TempSrc: Oral Oral  Oral  SpO2: 99% 99% 100% 100%  Weight:      Height:        Intake/Output Summary (Last 24 hours) at 02/03/2021 1305 Last data filed at 02/03/2021 1218 Gross per 24 hour  Intake 120 ml  Output 2735 ml  Net -2615 ml   Filed Weights   02/02/21 1422 02/03/21 0320  Weight: 104.8 kg 92.6 kg    Exam:  General: Appearance:     Overweight male in no acute distress     Lungs:     respirations unlabored  Heart:    Normal heart rate.   MS:   All extremities are intact. + LE edema. 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 No apparent focal neurological           defect.      Data Reviewed:   I have personally reviewed following labs and imaging studies:  Labs: Labs show the following:   Basic Metabolic Panel: Recent Labs  Lab 02/02/21 1436 02/03/21 0408  NA 134* 132*  K 4.7 3.9  CL 102 102  CO2 25 22  GLUCOSE  124* 179*  BUN 40* 40*  CREATININE 2.04* 2.22*  CALCIUM 9.4 8.9  MG  --  2.0   GFR Estimated Creatinine Clearance: 30.8 mL/min (A) (by C-G formula based on SCr of 2.22 mg/dL (H)). Liver Function Tests: Recent Labs  Lab 02/03/21 0408  AST 20  ALT 27  ALKPHOS 90  BILITOT 0.4  PROT 6.9  ALBUMIN 3.3*   No results for input(s): LIPASE, AMYLASE in the last 168 hours. No results for input(s): AMMONIA in the last 168 hours. Coagulation profile No results for input(s): INR, PROTIME in the last 168 hours.  CBC: Recent Labs  Lab 02/02/21 1436 02/03/21 0408  WBC 5.8 5.4  HGB 11.2* 10.1*  HCT 34.8* 31.7*  MCV 96.9 96.9  PLT 88* 80*   Cardiac Enzymes: No results for input(s): CKTOTAL, CKMB, CKMBINDEX, TROPONINI in the last 168 hours. BNP (last 3 results) No results for input(s): PROBNP in the last 8760 hours. CBG: Recent Labs  Lab 02/03/21 0529 02/03/21 1118  GLUCAP 139* 174*   D-Dimer: No results for input(s): DDIMER in the last 72 hours. Hgb A1c: No results for input(s): HGBA1C in the last 72 hours. Lipid Profile: No results for input(s): CHOL, HDL, LDLCALC, TRIG, CHOLHDL, LDLDIRECT in the last 72 hours. Thyroid function studies: No results for input(s): TSH, T4TOTAL, T3FREE, THYROIDAB in the last 72 hours.  Invalid input(s): FREET3 Anemia work up: No results for input(s): VITAMINB12, FOLATE, FERRITIN, TIBC, IRON, RETICCTPCT in the last 72 hours. Sepsis Labs: Recent Labs  Lab 02/02/21 1436 02/03/21 0408  WBC 5.8 5.4    Microbiology Recent Results (from the past 240 hour(s))  Resp Panel by RT-PCR (Flu A&B, Covid) Nasopharyngeal Swab     Status: None   Collection Time: 02/02/21  3:55 PM   Specimen: Nasopharyngeal Swab; Nasopharyngeal(NP) swabs in vial transport medium  Result Value Ref Range Status   SARS Coronavirus 2 by RT PCR NEGATIVE NEGATIVE Final    Comment: (NOTE) SARS-CoV-2 target nucleic acids are NOT DETECTED.  The SARS-CoV-2 RNA is generally  detectable in upper respiratory specimens during the acute phase of infection. The lowest concentration of SARS-CoV-2 viral copies this assay can detect is 138 copies/mL. A negative result does not preclude SARS-Cov-2 infection and should not be used as the sole basis for treatment or other patient management decisions. A negative result may occur with  improper specimen collection/handling, submission of specimen other than nasopharyngeal swab, presence of viral mutation(s) within the areas targeted by this assay, and inadequate number of viral copies(<138 copies/mL). A negative result must be combined with clinical observations, patient history, and epidemiological information. The expected result is Negative.  Fact Sheet for Patients:  EntrepreneurPulse.com.au  Fact Sheet for Healthcare Providers:  IncredibleEmployment.be  This test is no t yet approved or cleared by the [place] FDA and  has been authorized for detection and/or diagnosis of SARS-CoV-2 by FDA under an Emergency Use Authorization (EUA). This EUA will remain  in effect (meaning this test can be used) for the duration of the COVID-19 declaration under Section 564(b)(1) of the Act, 21 U.S.C.section 360bbb-3(b)(1), unless the authorization is terminated  or revoked sooner.       Influenza A by PCR NEGATIVE NEGATIVE Final   Influenza B by PCR NEGATIVE NEGATIVE Final    Comment: (NOTE) The Xpert Xpress SARS-CoV-2/FLU/RSV plus assay is intended as an aid in the diagnosis of influenza from Nasopharyngeal swab specimens and should not be used as a sole basis for treatment. Nasal washings and aspirates are unacceptable for Xpert Xpress SARS-CoV-2/FLU/RSV testing.  Fact Sheet for Patients: EntrepreneurPulse.com.au  Fact Sheet for Healthcare Providers: IncredibleEmployment.be  This test is not yet approved or cleared by the [place] FDA  and has been authorized for detection and/or diagnosis of SARS-CoV-2 by FDA under an Emergency Use Authorization (EUA). This EUA will remain in effect (meaning this test can be used) for the duration of the COVID-19 declaration under Section 564(b)(1) of the Act, 21 U.S.C. section 360bbb-3(b)(1), unless the authorization is terminated or revoked.  Performed at [facility], [address] [place], [place] [address]     Procedures and diagnostic studies:  DG Chest 2 View  Result Date: 02/02/2021 CLINICAL DATA:  Congestive heart failure. EXAM: CHEST - 2 VIEW COMPARISON:  August 23, 2020. FINDINGS: Stable cardiomegaly. Left-sided pacemaker is unchanged in position. Sternotomy wires are noted. Both lungs are clear. The visualized skeletal structures are unremarkable. IMPRESSION: No active cardiopulmonary disease. Electronically Signed   By: Marijo Conception M.D.   On: 02/02/2021 14:46    Medications:    amiodarone  200 mg Oral Daily   carvedilol  25 mg Oral BID WC   enoxaparin (LOVENOX) injection  40 mg Subcutaneous QHS   [START ON 02/04/2021] furosemide  40 mg Intravenous Daily   insulin aspart  0-15 Units Subcutaneous TID WC   losartan  100 mg Oral Daily   Continuous Infusions:   LOS: 1 day   Geradine Girt  [facility]   How to contact the [facility] Attending or Consulting provider [place] or covering provider during after hours [place], for this patient?  Check the care team in [facility] and look for a) attending/consulting TRH provider listed and b) the [facility] team listed Log into [email or web address] and use [place]'s universal password to access. If you do not have the password, please contact the hospital operator. Locate the [facility] provider you are looking for under [facility] and page to a number that you can be directly reached. If you still have difficulty reaching the provider, please page the [facility] (Director on Call) for the Hospitalists listed on amion for  assistance.  02/03/2021, 1:05 PM

## 2021-02-04 ENCOUNTER — Other Ambulatory Visit (HOSPITAL_COMMUNITY): Payer: Self-pay

## 2021-02-04 ENCOUNTER — Encounter (HOSPITAL_COMMUNITY): Payer: Self-pay | Admitting: Internal Medicine

## 2021-02-04 DIAGNOSIS — E1169 Type 2 diabetes mellitus with other specified complication: Secondary | ICD-10-CM | POA: Diagnosis not present

## 2021-02-04 DIAGNOSIS — N1832 Chronic kidney disease, stage 3b: Secondary | ICD-10-CM | POA: Diagnosis not present

## 2021-02-04 DIAGNOSIS — I509 Heart failure, unspecified: Secondary | ICD-10-CM | POA: Diagnosis not present

## 2021-02-04 DIAGNOSIS — E669 Obesity, unspecified: Secondary | ICD-10-CM | POA: Diagnosis not present

## 2021-02-04 LAB — BASIC METABOLIC PANEL
Anion gap: 9 (ref 5–15)
BUN: 42 mg/dL — ABNORMAL HIGH (ref 8–23)
CO2: 22 mmol/L (ref 22–32)
Calcium: 9.1 mg/dL (ref 8.9–10.3)
Chloride: 103 mmol/L (ref 98–111)
Creatinine, Ser: 2.22 mg/dL — ABNORMAL HIGH (ref 0.61–1.24)
GFR, Estimated: 29 mL/min — ABNORMAL LOW (ref >60)
Glucose, Bld: 182 mg/dL — ABNORMAL HIGH (ref 70–99)
Potassium: 4.1 mmol/L (ref 3.5–5.1)
Sodium: 134 mmol/L — ABNORMAL LOW (ref 135–145)

## 2021-02-04 LAB — CBC
HCT: 32.8 % — ABNORMAL LOW (ref 39.0–52.0)
Hemoglobin: 11 g/dL — ABNORMAL LOW (ref 13.0–17.0)
MCH: 32 pg (ref 26.0–34.0)
MCHC: 33.5 g/dL (ref 30.0–36.0)
MCV: 95.3 fL (ref 80.0–100.0)
Platelets: 85 10*3/uL — ABNORMAL LOW (ref 150–400)
RBC: 3.44 MIL/uL — ABNORMAL LOW (ref 4.22–5.81)
RDW: 14.3 % (ref 11.5–15.5)
WBC: 5.4 10*3/uL (ref 4.0–10.5)
nRBC: 0 % (ref 0.0–0.2)

## 2021-02-04 LAB — GLUCOSE, CAPILLARY
Glucose-Capillary: 155 mg/dL — ABNORMAL HIGH (ref 70–99)
Glucose-Capillary: 190 mg/dL — ABNORMAL HIGH (ref 70–99)
Glucose-Capillary: 141 mg/dL — ABNORMAL HIGH (ref 70–99)

## 2021-02-04 MED ORDER — TORSEMIDE 20 MG PO TABS
20.0000 mg | ORAL_TABLET | Freq: Every day | ORAL | 1 refills | Status: DC
  Filled 2021-02-04: qty 30, 30d supply, fill #0

## 2021-02-04 MED ORDER — PANTOPRAZOLE SODIUM 40 MG PO TBEC: 40.0000 mg | DELAYED_RELEASE_TABLET | Freq: Every day | ORAL | 0 refills | Status: DC

## 2021-02-04 NOTE — Progress Notes (Signed)
MD, pt did not want his unaboots on anymore, could not stand them any longer.  Pt was not in a good mood and was restless with them on, was not able to sleep.  RN explained the importance of the unaboots, but he just wanted them off.  RN took off his unaboots and pt was able to rest better after this.  Will continue to monitor, Thanks Arvella Nigh RN.

## 2021-02-04 NOTE — Progress Notes (Addendum)
Heart Failure Navigation Team Progress Note  PCP: [name], MD Primary Cardiologist: Sanda Klein, MD Admitted from: PCP office  Past Medical History:  Diagnosis Date   Anticoagulant long-term use    Arthritis    Arthropathy of left shoulder    Atypical mole    Biceps tendonitis, left    Bursitis of left shoulder    Cardiac pacemaker in situ    first insertion 2008;  genertor and new lead change 03-08-2012;   Medtronic   CHB (complete heart block) ([place])    s/p  PPM 2008   Chronic back pain    CKD (chronic kidney disease), stage III [facility])    Coronary artery disease cardiologist--- dr croitoru   hx  CABG x2  1990 and re-do 2004;  multiple PCI to [place] to 2004;    cardiac cath 2012  occluded LAD and RCA with patent grafts   Difficult intubation    " Dr. Sallyanne Kuster said that it was difficulty to get the tube in."   History of coronary angioplasty    multiple PCI to RCA ,  1990 to 2004   History of GI bleed followed by GI-- [facility]hes [facility] [place]   01/ 2020  upper and lower GI bleed ;  s/p  EGD with cautery of jejunal bleed and blood transfusion's   History of kidney stones    Hx of echocardiogram 06/29/2008   EF 45-50%    Hypertension    Ischemic cardiomyopathy    previously reported , ef 40-45%;  2010--- ef 45-50%;   last echo 2017 55-60%   Mixed hyperlipidemia    Nocturia    OSA on CPAP    cpap, 12, last sleep study Jan2013, sees Dr. Keturah Barre   PAF (paroxysmal atrial fibrillation) ([place])    long hx PAF--- followed by dr croitoru   Post-polio syndrome    dx polio age 30;   09-30-2018  per pt occasional gait issues and occasion fall   Presence of permanent cardiac pacemaker    Pseudoarthrosis of lumbar spine    Rotator cuff tear, left    S/P CABG x 2    1990 x2  and 2004  x2   Sinus node dysfunction ([place])    2015--- sinus node arrest  with intact AV conduction   Squamous cell carcinoma of skin    Type 2 diabetes mellitus ([place])     followed by pcp    Social History   Socioeconomic History   Marital status: Single    Spouse name: Not on file   Number of children: 1   Years of education: 16   Highest education level: Associate degree: academic program  Occupational History   Occupation: pilot-retired  Tobacco Use   Smoking status: Never   Smokeless tobacco: Never  Vaping Use   Vaping Use: Never used  Substance and Sexual Activity   Alcohol use: Not Currently   Drug use: Never   Sexual activity: Yes  Other Topics Concern   Not on file  Social History Narrative   Lives on [address] @ [employer], fishes.    Right-handed.   1 cup caffeine per day.   Social Determinants of Health   Financial Resource Strain: Low Risk    Difficulty of Paying Living Expenses: Not hard at all  Food Insecurity: No Food Insecurity   Worried About Charity fundraiser in the Last Year: Never true   Ran Out  of Food in the Last Year: Never true  Transportation Needs: No Transportation Needs   Lack of Transportation (Medical): No   Lack of Transportation (Non-Medical): No  Physical Activity: Not on file  Stress: Not on file  Social Connections: Not on file     Heart & Vascular Transition of Care Clinic follow-up: Scheduled for 02/11/21 at 3pm.  Confirmed transportation.  Immediate social needs: N/A   Jaydalyn Demattia, MSW, Latanya Presser [phone] Heart Failure Social Worker

## 2021-02-04 NOTE — Progress Notes (Signed)
Heart Failure Nurse Navigator Progress Note  PCP: [name], MD PCP-Cardiologist: Bertrum Sol., MD Admission Diagnosis: A CHF exacerbation Admitted from: home alone  Presentation:   Alan [name] presented 12/28 with increased BLE. Pt lives home and has girlfriend. Patient interactive with interview process. States frustration with his changes in medication recently from PCP office. Explained titration and balance of medication (lasix) with kidney function and fluid balance. Pt states he drives a reliable vehicle. 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 States he does not drink alcohol, smoke or partake in illicit drugs. Explained fluid restriction plan.  Explained benefits of Heart & Vascular Transitions of Care Clinic appointment, patient agreeable.    ECHO/ LVEF: 50-55%  Clinical Course:  Past Medical History:  Diagnosis Date   Anticoagulant long-term use    Arthritis    Arthropathy of left shoulder    Atypical mole    Biceps tendonitis, left    Bursitis of left shoulder    Cardiac pacemaker in situ    first insertion 2008;  genertor and new lead change 03-08-2012;   Medtronic   CHB (complete heart block) ([place])    s/p  PPM 2008   Chronic back pain    CKD (chronic kidney disease), stage III [facility]    Coronary artery disease cardiologist--- dr croitoru   hx  CABG x2  1990 and re-do 2004;  multiple PCI to [place] to 2004;    cardiac cath 2012  occluded LAD and RCA with patent grafts   Difficult intubation    " Dr. Sallyanne Kuster said that it was difficulty to get the tube in."   History of coronary angioplasty    multiple PCI to RCA ,  1990 to 2004   History of GI bleed followed by GI-- Ole[facility]erches PA @ [facility] in [place]   01/ 2020  upper and lower GI bleed ;  s/p  EGD with cautery of jejunal bleed and blood transfusion's   History of kidney stones    Hx of echocardiogram 06/29/2008   EF 45-50%    Hypertension     Ischemic cardiomyopathy    previously reported , ef 40-45%;  2010--- ef 45-50%;   last echo 2017 55-60%   Mixed hyperlipidemia    Nocturia    OSA on CPAP    cpap, 12, last sleep study Jan2013, sees Dr. Keturah Barre   PAF (paroxysmal atrial fibrillation) ([place])    long hx PAF--- followed by dr croitoru   Post-polio syndrome    dx polio age 27;   09-30-2018  per pt occasional gait issues and occasion fall   Presence of permanent cardiac pacemaker    Pseudoarthrosis of lumbar spine    Rotator cuff tear, left    S/P CABG x 2    1990 x2  and 2004  x2   Sinus node dysfunction ([place])    2015--- sinus node arrest  with intact AV conduction   Squamous cell carcinoma of skin    Type 2 diabetes mellitus ([place])    followed by pcp     Social History   Socioeconomic History   Marital status: Single    Spouse name: Not on file   Number of children: 1   Years of education: 16   Highest education level: Associate degree: academic program  Occupational History   Occupation: pilot-retired  Tobacco Use   Smoking status: Never   Smokeless tobacco: Never  Vaping Use   Vaping Use: Never used  Substance and Sexual Activity   Alcohol use: Not Currently   Drug use: Never   Sexual activity: Yes  Other Topics Concern   Not on file  Social History Narrative   Lives on [address] [facility], fishes.    Right-handed.   1 cup caffeine per day.   Social Determinants of Health   Financial Resource Strain: Low Risk    Difficulty of Paying Living Expenses: Not hard at all  Food Insecurity: No Food Insecurity   Worried About Charity fundraiser in the Last Year: Never true   [place] in the Last Year: Never true  Transportation Needs: No Transportation Needs   Lack of Transportation (Medical): No   Lack of Transportation (Non-Medical): No  Physical Activity: Not on file  Stress: Not on file  Social Connections: Not on file    High Risk Criteria for Readmission and/or Poor Patient  Outcomes: Heart failure hospital admissions (last 6 months): 2  No Show rate: 9% Difficult social situation: no Demonstrates medication adherence: yes Primary Language: English Literacy level: able to read/write and comprehend.  Barriers of Care:   -none  Considerations/Referrals:   Referral made to Heart Failure Pharmacist Stewardship: no Referral made to Heart Failure CSW/NCM TOC: no Referral made to Heart & Vascular TOC clinic: yes, 1/6 @ 3pm  Items for Follow-up on DC/TOC: -optimize    Pricilla Holm, MSN, RN Heart Failure Nurse Navigator [phone]

## 2021-02-04 NOTE — Discharge Summary (Signed)
Physician Discharge Summary  Alan [name]:[id number] DOB: [birth year] DOA: 02/02/2021  PCP: [name], MD  Admit date: 02/02/2021 Discharge date: 02/04/2021  Admitted From: home Discharge disposition: home   Recommendations for Outpatient Follow-Up:   Needs close cardiology follow up Lasix changed to demadex as per old cards recommendations Placed referral to nephrology Needs to elevate extremities  Metformin held due to renal failure BMP/CBC 1 week    Discharge Diagnosis:   Principal Problem:   Acute exacerbation of CHF (congestive heart failure) ([place]) Active Problems:   Diabetes mellitus type 2 in obese ([place])   Persistent atrial fibrillation ([place])   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  Diet recommendation: Low sodium, heart healthy.  Carbohydrate-modified.  Wound care: None.  Code status: Full.   History of Present Illness:   Alan [name] is a 80 y.o. male with medical history significant of CHF, anemia, CAD, complete heart block status post pacemaker, CKD, diabetes, hypertension, hyperlipidemia, OSA, A. fib, SSS, post polio syndrome presenting with shortness of breath from PCP office.  Patient has known history of CHF and for the past few weeks due to an increase in creatinine he is dose of Lasix had been down titrated.  Initially dose was reduced from 40 mg daily to 20 mg daily and then 10 mg daily, then every other day, and then held for this past week.  Since that time patient has had some increasing edema and a 20 pound weight gain.  Also endorses orthopnea.  He states that he has tried a couple doses  of Lasix, 1 yesterday morning and yesterday evening due to shortness of breath.  Minimal improvement.   Denies fevers, chills, chest pain, Donnell pain, constipation, diarrhea, nausea, vomiting.   Hospital Course by Problem:   CHF exacerbation > Last echocardiogram was in July of this year with EF 50-55% with moderate reduced RV function. > Patient presenting with signs and symptoms of CHF exacerbation with edema and weight gain and orthopnea in the setting of down titration of outpatient Lasix due to issues with renal function. -IV Lasix twice daily- changed to PO torsemide  - down > 10lbs -did not tolerate unna boots but LE swelling improved   Atrial fibrillation SSS Complete heart block > Status post pacemaker - Continue home carvedilol and amiodarone  CAD Hyperlipidemia > Status post CABG and redo CABG - Continue home losartan, carvedilol   Hypertension -Continue home  carvedilol, losartan -held norvasc for LE edema -well controlled  CKD 3B > Creatinine near baseline which is 1.7 - Trend renal function and electrolytes- closely outpateint - Avoid nephrotoxic agents  Diabetes - SSI           Medical Consultants:      Discharge Exam:   Vitals:   02/04/21 0857 02/04/21 1131  BP: 127/85 129/81  Pulse: 63 62  Resp: 13 19  Temp:  97.9 F (36.6 C)  SpO2: 100% 100%   Vitals:   02/04/21 0344 02/04/21 0753 02/04/21 0857 02/04/21 1131  BP: 137/63 131/64 127/85 129/81  Pulse: 62 61 63 62  Resp: 14 14  13 19  Temp: 97.6 F (36.4 C) (!) 97.4 F (36.3 C)  97.9 F (36.6 C)  TempSrc: Oral Oral  Oral  SpO2: 100%  100% 100%  Weight:      Height:        General exam: Appears calm and comfortable.    The results of significant diagnostics from this hospitalization (including imaging, microbiology, ancillary and laboratory) are listed below for reference.     Procedures and Diagnostic Studies:   DG Chest 2 View  Result Date: 02/02/2021 CLINICAL DATA:   Congestive heart failure. EXAM: CHEST - 2 VIEW COMPARISON:  August 23, 2020. FINDINGS: Stable cardiomegaly. Left-sided pacemaker is unchanged in position. Sternotomy wires are noted. Both lungs are clear. The visualized skeletal structures are unremarkable. IMPRESSION: No active cardiopulmonary disease. Electronically Signed   By: Marijo Conception M.D.   On: 02/02/2021 14:46   ECHOCARDIOGRAM COMPLETE  Result Date: 02/03/2021    ECHOCARDIOGRAM REPORT   Patient Name:   LYNNE RIGHI Date of Exam: 02/03/2021 Medical Rec #:  [id number]     Height:       70.0 in Accession #:    [id number]    Weight:       204.1 lb Date of Birth:  1940/11/30    BSA:          2.105 m Patient Age:    39 years      BP:           139/69 mmHg Patient Gender: M             HR:           62 bpm. Exam Location:  Inpatient Procedure: 2D Echo, 3D Echo, Cardiac Doppler and Color Doppler Indications:    R06.02 SOB  History:        Patient has prior history of Echocardiogram examinations, most                 recent 08/24/2020. CHF and Cardiomyopathy, CAD, Prior CABG,                 Abnormal ECG and Pacemaker, Arrythmias:Atrial Fibrillation and                 Atrial Flutter, Signs/Symptoms:Shortness of Breath and Dyspnea;                 Risk Factors:Diabetes, Sleep Apnea, Hypertension and                 Dyslipidemia.  Sonographer:    Roseanna Rainbow RDCS Referring Phys: [id number] [place]  Sonographer Comments: Technically difficult study due to poor echo windows. Image acquisition challenging due to patient body habitus. IMPRESSIONS  1. Left ventricular ejection fraction, by estimation, is 55 to 60%. Left ventricular ejection fraction by 3D volume is 56 %. Left ventricular ejection fraction by 2D MOD biplane is 59.4 %. Left ventricular ejection fraction by PLAX is 60 %. The left ventricle has normal function. The left ventricle has no regional wall motion abnormalities. There is mild concentric left ventricular hypertrophy. Indeterminate  diastolic filling due to E-A fusion.  2. Right ventricular systolic function is normal. The right ventricular size is normal. There is normal pulmonary artery systolic pressure.  3. Left atrial size was moderately dilated.  4. The mitral valve is normal in structure. Trivial mitral valve regurgitation. No evidence of mitral stenosis.  5. Tricuspid valve regurgitation is mild to moderate.  6. The aortic valve is  tricuspid. Aortic valve regurgitation is not visualized. No aortic stenosis is present.  7. The inferior vena cava is dilated in size with >50% respiratory variability, suggesting right atrial pressure of 8 mmHg. FINDINGS  Left Ventricle: Left ventricular ejection fraction, by estimation, is 55 to 60%. Left ventricular ejection fraction by PLAX is 60 %. Left ventricular ejection fraction by 2D MOD biplane is 59.4 %. Left ventricular ejection fraction by 3D volume is 56 %.  The left ventricle has normal function. The left ventricle has no regional wall motion abnormalities. The left ventricular internal cavity size was normal in size. There is mild concentric left ventricular hypertrophy. Indeterminate diastolic filling due to E-A fusion. Indeterminate filling pressures. Right Ventricle: The right ventricular size is normal. No increase in right ventricular wall thickness. Right ventricular systolic function is normal. There is normal pulmonary artery systolic pressure. The tricuspid regurgitant velocity is 2.61 m/s, and  with an assumed right atrial pressure of 8 mmHg, the estimated right ventricular systolic pressure is 16.1 mmHg. Left Atrium: Left atrial size was moderately dilated. Right Atrium: Right atrial size was normal in size. Pericardium: There is no evidence of pericardial effusion. Mitral Valve: The mitral valve is normal in structure. Mild mitral annular calcification. Trivial mitral valve regurgitation. No evidence of mitral valve stenosis. Tricuspid Valve: The tricuspid valve is normal in  structure. Tricuspid valve regurgitation is mild to moderate. No evidence of tricuspid stenosis. Aortic Valve: The aortic valve is tricuspid. Aortic valve regurgitation is not visualized. No aortic stenosis is present. Pulmonic Valve: The pulmonic valve was normal in structure. Pulmonic valve regurgitation is not visualized. No evidence of pulmonic stenosis. Aorta: The aortic root is normal in size and structure. Venous: The inferior vena cava is dilated in size with greater than 50% respiratory variability, suggesting right atrial pressure of 8 mmHg. IAS/Shunts: No atrial level shunt detected by color flow Doppler. Additional Comments: A device lead is visualized.  LEFT VENTRICLE PLAX 2D                        Biplane EF (MOD) LV EF:         Left            LV Biplane EF:   Left                ventricular                      ventricular                ejection                         ejection                fraction by                      fraction by                PLAX is 60                       2D MOD                %.                               biplane is  LVIDd:         5.60 cm                          59.4 %. LVIDs:         3.80 cm LV PW:         1.30 cm         Diastology LV IVS:        1.20 cm         LV e' medial:    6.53 cm/s LVOT diam:     2.30 cm         LV E/e' medial:  14.0 LV SV:         87              LV e' lateral:   13.10 cm/s LV SV Index:   41              LV E/e' lateral: 7.0 LVOT Area:     4.15 cm                                 3D Volume EF LV Volumes (MOD)               LV 3D EF:    Left LV vol d, MOD    89.0 ml                    ventricul A2C:                                        ar LV vol d, MOD    129.0 ml                   ejection A4C:                                        fraction LV vol s, MOD    37.5 ml                    by 3D A2C:                                        volume is LV vol s, MOD    52.5 ml                    56 %. A4C: LV SV MOD A2C:   51.5 ml LV SV MOD A4C:    129.0 ml      3D Volume EF: LV SV MOD BP:    64.4 ml       3D EF:        56 %                                LV EDV:       154 ml  LV ESV:       67 ml                                LV SV:        87 ml RIGHT VENTRICLE            IVC RV S prime:     6.65 cm/s  IVC diam: 2.40 cm TAPSE (M-mode): 1.3 cm LEFT ATRIUM             Index        RIGHT ATRIUM           Index LA diam:        4.90 cm 2.33 cm/m   RA Area:     22.00 cm LA Vol (A2C):   61.1 ml 29.02 ml/m  RA Volume:   58.20 ml  27.64 ml/m LA Vol (A4C):   77.6 ml 36.86 ml/m LA Biplane Vol: 69.2 ml 32.87 ml/m  AORTIC VALVE LVOT Vmax:   103.00 cm/s LVOT Vmean:  63.200 cm/s LVOT VTI:    0.209 m  AORTA Ao Root diam: 3.50 cm Ao Asc diam:  3.30 cm MITRAL VALVE               TRICUSPID VALVE MV Area (PHT): 3.91 cm    TR Peak grad:   27.2 mmHg MV Decel Time: 194 msec    TR Vmax:        261.00 cm/s MV E velocity: 91.35 cm/s                            SHUNTS                            Systemic VTI:  0.21 m                            Systemic Diam: 2.30 cm Skeet Latch MD Electronically signed by Skeet Latch MD Signature Date/Time: 02/03/2021/6:28:58 PM    Final      Labs:   Basic Metabolic Panel: Recent Labs  Lab 02/02/21 1436 02/03/21 0408 02/04/21 0338  NA 134* 132* 134*  K 4.7 3.9 4.1  CL 102 102 103  CO2 25 22 22   GLUCOSE 124* 179* 182*  BUN 40* 40* 42*  CREATININE 2.04* 2.22* 2.22*  CALCIUM 9.4 8.9 9.1  MG  --  2.0  --    GFR Estimated Creatinine Clearance: 31.9 mL/min (A) (by C-G formula based on SCr of 2.22 mg/dL (H)). Liver Function Tests: Recent Labs  Lab 02/03/21 0408  AST 20  ALT 27  ALKPHOS 90  BILITOT 0.4  PROT 6.9  ALBUMIN 3.3*   No results for input(s): LIPASE, AMYLASE in the last 168 hours. No results for input(s): AMMONIA in the last 168 hours. Coagulation profile No results for input(s): INR, PROTIME in the last 168 hours.  CBC: Recent Labs  Lab 02/02/21 1436  02/03/21 0408 02/04/21 0338  WBC 5.8 5.4 5.4  HGB 11.2* 10.1* 11.0*  HCT 34.8* 31.7* 32.8*  MCV 96.9 96.9 95.3  PLT 88* 80* 85*   Cardiac Enzymes: No results for input(s): CKTOTAL, CKMB, CKMBINDEX, TROPONINI in the last 168 hours. BNP: Invalid input(s): POCBNP CBG: Recent Labs  Lab 02/03/21 1118 02/03/21 1606 02/03/21 2055 02/04/21 0617  02/04/21 1136  GLUCAP 174* 156* 144* 155* 190*   D-Dimer No results for input(s): DDIMER in the last 72 hours. Hgb A1c No results for input(s): HGBA1C in the last 72 hours. Lipid Profile No results for input(s): CHOL, HDL, LDLCALC, TRIG, CHOLHDL, LDLDIRECT in the last 72 hours. Thyroid function studies No results for input(s): TSH, T4TOTAL, T3FREE, THYROIDAB in the last 72 hours.  Invalid input(s): FREET3 Anemia work up No results for input(s): VITAMINB12, FOLATE, FERRITIN, TIBC, IRON, RETICCTPCT in the last 72 hours. Microbiology Recent Results (from the past 240 hour(s))  Resp Panel by RT-PCR (Flu A&B, Covid) Nasopharyngeal Swab     Status: None   Collection Time: 02/02/21  3:55 PM   Specimen: Nasopharyngeal Swab; Nasopharyngeal(NP) swabs in vial transport medium  Result Value Ref Range Status   SARS Coronavirus 2 by RT PCR NEGATIVE NEGATIVE Final    Comment: (NOTE) SARS-CoV-2 target nucleic acids are NOT DETECTED.  The SARS-CoV-2 RNA is generally detectable in upper respiratory specimens during the acute phase of infection. The lowest concentration of SARS-CoV-2 viral copies this assay can detect is 138 copies/mL. A negative result does not preclude SARS-Cov-2 infection and should not be used as the sole basis for treatment or other patient management decisions. A negative result may occur with  improper specimen collection/handling, submission of specimen other than nasopharyngeal swab, presence of viral mutation(s) within the areas targeted by this assay, and inadequate number of viral copies(<138 copies/mL). A negative  result must be combined with clinical observations, patient history, and epidemiological information. The expected result is Negative.  Fact Sheet for Patients:  EntrepreneurPulse.com.au  Fact Sheet for Healthcare Providers:  IncredibleEmployment.be  This test is no t yet approved or cleared by the [place] FDA and  has been authorized for detection and/or diagnosis of SARS-CoV-2 by FDA under an Emergency Use Authorization (EUA). This EUA will remain  in effect (meaning this test can be used) for the duration of the COVID-19 declaration under Section 564(b)(1) of the Act, 21 U.S.C.section 360bbb-3(b)(1), unless the authorization is terminated  or revoked sooner.       Influenza A by PCR NEGATIVE NEGATIVE Final   Influenza B by PCR NEGATIVE NEGATIVE Final    Comment: (NOTE) The Xpert Xpress SARS-CoV-2/FLU/RSV plus assay is intended as an aid in the diagnosis of influenza from Nasopharyngeal swab specimens and should not be used as a sole basis for treatment. Nasal washings and aspirates are unacceptable for Xpert Xpress SARS-CoV-2/FLU/RSV testing.  Fact Sheet for Patients: EntrepreneurPulse.com.au  Fact Sheet for Healthcare Providers: IncredibleEmployment.be  This test is not yet approved or cleared by the [place] FDA and has been authorized for detection and/or diagnosis of SARS-CoV-2 by FDA under an Emergency Use Authorization (EUA). This EUA will remain in effect (meaning this test can be used) for the duration of the COVID-19 declaration under Section 564(b)(1) of the Act, 21 U.S.C. section 360bbb-3(b)(1), unless the authorization is terminated or revoked.  Performed at [facility], [address], [place], [place] [address]      Discharge Instructions:   Discharge Instructions     ([place]) Call MD:  Anytime you have any of the following  symptoms: 1) 3 pound weight gain in 24 hours or 5 pounds in 1 week 2) shortness of breath, with or without a dry hacking cough 3) swelling in the hands, feet or stomach 4) if you have to sleep on extra pillows at night in order to breathe.  Complete by: As directed    Ambulatory referral to Nephrology   Complete by: As directed    Diet - low sodium heart healthy   Complete by: As directed    Diet Carb Modified   Complete by: As directed    Discharge instructions   Complete by: As directed    Have placed referral to nephrology Holding metformin for now due to your kidney function BMP 1 week Keep legs elevated to help with swelling   Heart Failure patients record your daily weight using the same scale at the same time of day   Complete by: As directed    Increase activity slowly   Complete by: As directed       Allergies as of 02/04/2021   No Known Allergies      Medication List     STOP taking these medications    amLODipine 10 MG tablet Commonly known as: NORVASC   furosemide 20 MG tablet Commonly known as: LASIX   HYDROcodone-acetaminophen 10-325 MG tablet Commonly known as: NORCO   metFORMIN 500 MG 24 hr tablet Commonly known as: GLUCOPHAGE-XR   metFORMIN 500 MG tablet Commonly known as: GLUCOPHAGE   Potassium Chloride ER 20 MEQ Tbcr       TAKE these medications    acetaminophen 500 MG tablet Commonly known as: TYLENOL Take 1,000 mg by mouth every 6 (six) hours as needed for mild pain or headache.   amiodarone 200 MG tablet Commonly known as: PACERONE TAKE 2 TABLETS BY MOUTH ONCE DAILY FOR 14 DAYS, THEN TAKE 1 TABLET ONCE DAILY What changed: See the new instructions.   carvedilol 25 MG tablet Commonly known as: COREG Take 1 tablet (25 mg total) by mouth 2 (two) times daily with a meal.   ferrous sulfate 325 (65 FE) MG tablet Take 1 tablet (325 mg total) by mouth daily with breakfast. What changed: when to take this   glipiZIDE 10 MG  tablet Commonly known as: GLUCOTROL Take 10 mg by mouth 2 (two) times daily.   insulin lispro 100 UNIT/ML injection Commonly known as: HUMALOG Inject 2-8 Units into the skin as needed for high blood sugar.   losartan 100 MG tablet Commonly known as: COZAAR Take 100 mg by mouth daily.   multivitamin with minerals Tabs tablet Take 1 tablet by mouth daily.   pantoprazole 40 MG tablet Commonly known as: PROTONIX Take 1 tablet (40 mg total) by mouth daily.   [place] 4 % Crea Generic drug: Fluorouracil Apply to affected area nightly x 2 weeks   torsemide 20 MG tablet Commonly known as: DEMADEX Take 1 tablet (20 mg total) by mouth daily.          Time coordinating discharge: 35 min  Signed:  Geradine Girt DO  [facility] 02/04/2021, 12:50 PM

## 2021-02-04 NOTE — Plan of Care (Signed)
Problem: Skin Integrity: Goal: Risk for impaired skin integrity will decrease Outcome: Completed/Met   Problem: Pain Managment: Goal: General experience of comfort will improve Outcome: Completed/Met   Problem: Coping: Goal: Level of anxiety will decrease Outcome: Completed/Met   Problem: Nutrition: Goal: Adequate nutrition will be maintained Outcome: Completed/Met

## 2021-02-04 NOTE — Care Management Important Message (Signed)
Important Message  Patient Details  Name: Alan [name] MRN: [id number] Date of Birth: 1940/07/20   Medicare Important Message Given:  Yes     Luvena Wentling 02/04/2021, 2:00 PM

## 2021-02-08 ENCOUNTER — Ambulatory Visit: Payer: Medicare HMO | Admitting: Physician Assistant

## 2021-02-08 ENCOUNTER — Other Ambulatory Visit: Payer: Self-pay

## 2021-02-08 DIAGNOSIS — C44629 Squamous cell carcinoma of skin of left upper limb, including shoulder: Secondary | ICD-10-CM | POA: Diagnosis not present

## 2021-02-08 DIAGNOSIS — L570 Actinic keratosis: Secondary | ICD-10-CM

## 2021-02-08 DIAGNOSIS — C4442 Squamous cell carcinoma of skin of scalp and neck: Secondary | ICD-10-CM

## 2021-02-08 DIAGNOSIS — D485 Neoplasm of uncertain behavior of skin: Secondary | ICD-10-CM

## 2021-02-08 DIAGNOSIS — D0422 Carcinoma in situ of skin of left ear and external auricular canal: Secondary | ICD-10-CM

## 2021-02-08 DIAGNOSIS — D0430 Carcinoma in situ of skin of unspecified part of face: Secondary | ICD-10-CM

## 2021-02-08 DIAGNOSIS — Z85828 Personal history of other malignant neoplasm of skin: Secondary | ICD-10-CM

## 2021-02-08 NOTE — Patient Instructions (Signed)

## 2021-02-09 ENCOUNTER — Encounter: Payer: Self-pay | Admitting: Physician Assistant

## 2021-02-09 ENCOUNTER — Other Ambulatory Visit: Payer: Self-pay | Admitting: Cardiovascular Disease

## 2021-02-09 NOTE — Progress Notes (Signed)
Follow-Up Visit   Subjective  Alan [name] is a 81 y.o. male who presents for the following: Follow-up (Patient here today for 6 month skin check. Per patient he has lesions on his left arm, nose, right side of face and scalp x few months. Per patient no bleeding, and no pain. Personal history of non mole skin cancer. No personal history or family history of atypical moles or non mole skin cancer. No family history of non mole skin cancer. ).   The following portions of the chart were reviewed this encounter and updated as appropriate:  Tobacco   Allergies   Meds   Problems   Med Hx   Surg Hx   Fam Hx       Objective  Well appearing patient in no apparent distress; mood and affect are within normal limits.  All skin waist up examined.  Right Sideburn Hyperkeratotic scale with pink base          Dorsum of Nose, Left Eyebrow, Left Temple (3), Mid Frontal Scalp Erythematous patches with gritty scale.  Left Forearm - Posterior Hyperkeratotic scale with pink base        Left Postauricular Area Hyperkeratotic scale with pink base        Right Mid Parietal Scalp Hyperkeratotic scale with pink base         Assessment & Plan  Neoplasm of uncertain behavior of skin Right Sideburn  Skin / nail biopsy Type of biopsy: tangential   Informed consent: discussed and consent obtained   Timeout: patient name, date of birth, surgical site, and procedure verified   Anesthesia: the lesion was anesthetized in a standard fashion   Anesthetic:  1% lidocaine w/ epinephrine 1-100,000 local infiltration Instrument used: flexible razor blade   Hemostasis achieved with: ferric subsulfate   Outcome: patient tolerated procedure well   Post-procedure details: wound care instructions given    Destruction of lesion Complexity: simple   Destruction method: electrodesiccation and curettage   Informed consent: discussed and consent obtained   Timeout:  patient name, date of  birth, surgical site, and procedure verified Anesthesia: the lesion was anesthetized in a standard fashion   Anesthetic:  1% lidocaine w/ epinephrine 1-100,000 local infiltration Curettage performed in three different directions: Yes   Electrodesiccation performed over the curetted area: Yes   Curettage cycles:  1 Margin per side (cm):  0.1 Final wound size (cm):  1.5 Hemostasis achieved with:  aluminum chloride Outcome: patient tolerated procedure well with no complications   Post-procedure details: sterile dressing applied and wound care instructions given   Dressing type: bandage and petrolatum    Specimen 3 - Surgical pathology Differential Diagnosis: R/O BCC vs SCC - treated after biopsy  Check Margins: No  Actinic keratosis (6) Left Eyebrow; Mid Frontal Scalp; Left Temple (3); Dorsum of Nose  Destruction of lesion - Dorsum of Nose, Left Eyebrow, Left Temple, Mid Frontal Scalp Complexity: simple   Destruction method: cryotherapy   Informed consent: discussed and consent obtained   Timeout:  patient name, date of birth, surgical site, and procedure verified Lesion destroyed using liquid nitrogen: Yes   Cryotherapy cycles:  1 Outcome: patient tolerated procedure well with no complications   Post-procedure details: wound care instructions given    SCC (squamous cell carcinoma), arm, left Left Forearm - Posterior  Skin / nail biopsy Type of biopsy: tangential   Informed consent: discussed and consent obtained   Timeout: patient name, date of birth, surgical site, and  procedure verified   Anesthesia: the lesion was anesthetized in a standard fashion   Anesthetic:  1% lidocaine w/ epinephrine 1-100,000 local infiltration Instrument used: flexible razor blade   Hemostasis achieved with: aluminum chloride and electrodesiccation   Outcome: patient tolerated procedure well   Post-procedure details: sterile dressing applied and wound care instructions given   Dressing type:  bandage and petrolatum    Destruction of lesion Complexity: simple   Destruction method: electrodesiccation and curettage   Informed consent: discussed and consent obtained   Timeout:  patient name, date of birth, surgical site, and procedure verified Anesthesia: the lesion was anesthetized in a standard fashion   Anesthetic:  1% lidocaine w/ epinephrine 1-100,000 local infiltration Curettage performed in three different directions: Yes   Electrodesiccation performed over the curetted area: Yes   Curettage cycles:  1 Margin per side (cm):  0.1 Final wound size (cm):  1 Hemostasis achieved with:  aluminum chloride and electrodesiccation Outcome: patient tolerated procedure well with no complications   Post-procedure details: sterile dressing applied and wound care instructions given   Dressing type: bandage and petrolatum    Specimen 1 - Surgical pathology Differential Diagnosis: R/O BCC vs sCC - treated after biopsy  Check Margins: No  Carcinoma in situ of skin of face, unspecified location Left Postauricular Area  Skin / nail biopsy Type of biopsy: tangential   Informed consent: discussed and consent obtained   Timeout: patient name, date of birth, surgical site, and procedure verified   Anesthesia: the lesion was anesthetized in a standard fashion   Anesthetic:  1% lidocaine w/ epinephrine 1-100,000 local infiltration Instrument used: flexible razor blade   Hemostasis achieved with: aluminum chloride and electrodesiccation   Outcome: patient tolerated procedure well   Post-procedure details: sterile dressing applied and wound care instructions given   Dressing type: bandage and petrolatum    Destruction of lesion Complexity: simple   Destruction method: electrodesiccation and curettage   Informed consent: discussed and consent obtained   Timeout:  patient name, date of birth, surgical site, and procedure verified Anesthesia: the lesion was anesthetized in a standard  fashion   Anesthetic:  1% lidocaine w/ epinephrine 1-100,000 local infiltration Curettage performed in three different directions: Yes   Electrodesiccation performed over the curetted area: Yes   Curettage cycles:  1 Margin per side (cm):  0.1 Final wound size (cm):  1 Hemostasis achieved with:  aluminum chloride and electrodesiccation Outcome: patient tolerated procedure well with no complications   Post-procedure details: sterile dressing applied and wound care instructions given   Dressing type: bandage and petrolatum    Specimen 2 - Surgical pathology Differential Diagnosis: R/O BCC vs SCC - treated after biopsy  Check Margins: No  SCC (squamous cell carcinoma), scalp/neck Right Mid Parietal Scalp  Skin / nail biopsy Type of biopsy: tangential   Informed consent: discussed and consent obtained   Timeout: patient name, date of birth, surgical site, and procedure verified   Anesthesia: the lesion was anesthetized in a standard fashion   Anesthetic:  1% lidocaine w/ epinephrine 1-100,000 local infiltration Instrument used: flexible razor blade   Hemostasis achieved with: aluminum chloride   Outcome: patient tolerated procedure well   Post-procedure details: wound care instructions given    Destruction of lesion Complexity: simple   Destruction method: electrodesiccation and curettage   Informed consent: discussed and consent obtained   Timeout:  patient name, date of birth, surgical site, and procedure verified Anesthesia: the lesion was anesthetized in a  standard fashion   Anesthetic:  1% lidocaine w/ epinephrine 1-100,000 local infiltration Curettage performed in three different directions: Yes   Electrodesiccation performed over the curetted area: Yes   Curettage cycles:  1 Margin per side (cm):  0.1 Final wound size (cm):  1.7 Hemostasis achieved with:  aluminum chloride and electrodesiccation Outcome: patient tolerated procedure well with no complications    Post-procedure details: sterile dressing applied and wound care instructions given   Dressing type: bandage and petrolatum    Specimen 4 - Surgical pathology Differential Diagnosis: R/O BCC vs SCC 0- treated after biopsy  Check Margins: No    I, Tanga Gloor, PA-C, have reviewed all documentation's for this visit.  The documentation on 02/14/21 for the exam, diagnosis, procedures and orders are all accurate and complete.

## 2021-02-10 ENCOUNTER — Telehealth (HOSPITAL_COMMUNITY): Payer: Self-pay

## 2021-02-10 NOTE — Progress Notes (Signed)
HEART & VASCULAR TRANSITION OF CARE CONSULT NOTE     Referring Physician: Dr. Lucianne Lei, Internal Medicine  Primary Care: [name], MD  Primary Cardiologist: Sanda Klein, MD  HPI: Referred to clinic by Dr. Lucianne Lei for heart failure consultation.   81 y/o Alan [name] w/ h/o chronic diastolic heart failure, CHB necessitating PPM implant in his 28s, atrial fibrillation/flutter, bilateral carpal tunnel s/p b/l carpal tunnel release, biceps tendon rupture s/p biceps tenodesis, lumbar spinal stenosis, CAD s/p initial CABG in 1990 and redo CABG in 2004, Type 2DM and CKD Stage IIIb. Also h/o GIBs and no longer on a/c for Afib. Was referred for Watchman evaluation but anatomy not suitable (see note by Dr. Burt Knack).   Recently admitted 78/29 w/ a/c diastolic HF and volume overload after home diuretics had been dose reduced due to rise in SCr. SCr had recently risen to 2.3 (prior baseline ~1.7). He was diuresed w/ IV Lasix and transitioned back to PO torsemide 20 mg daily. No UA nor renal US conducted. Plan was to f/u w/ outpatient nephrology.  Echo was obtained and showed normal LVEF, 55-60%, mild concentric LVH, RV normal. No significant valvular disease. Referred to TOC. Losartan was apparently continued at discharge  Presents today for evaluation. Reports feeling well. No significant limitation w/ ADLs. NYHA Class II. No resting dyspnea. No wt gain, LEE, orthopnea/ PND. Denies palpitations.     Cardiac Testing   Echocardiogram 02/03/21  Left ventricular ejection fraction, by estimation, is 55 to 60%. Left ventricular ejection fraction by 3D volume is 56 %. Left ventricular ejection fraction by 2D MOD biplane is 59.4 %. Left ventricular ejection fraction by PLAX is 60 %. The left ventricle has normal function. The left ventricle has no regional wall motion abnormalities. There is mild concentric left ventricular hypertrophy. Indeterminate diastolic filling due to E-A fusion. 1. Right  ventricular systolic function is normal. The right ventricular size is normal. There is normal pulmonary artery systolic pressure. 2. 3. Left atrial size was moderately dilated. The mitral valve is normal in structure. Trivial mitral valve regurgitation. No evidence of mitral stenosis. 4. 5. Tricuspid valve regurgitation is mild to moderate. The aortic valve is tricuspid. Aortic valve regurgitation is not visualized. No aortic stenosis is present. 6. The inferior vena cava is dilated in size with >50% respiratory variability, suggesting right atrial pressure of 8 mmHg.   Review of Systems: [y] = yes, [ ] = no   General: Weight gain [ ]; Weight loss [ ]; Anorexia [ ]; Fatigue [ ]; Fever [ ]; Chills [ ]; Weakness [ ]  Cardiac: Chest pain/pressure [ ]; Resting SOB [ ]; Exertional SOB [ Y]; Orthopnea [ ]; Pedal Edema [ ]; Palpitations [ ]; Syncope [ ]; Presyncope [ ]; Paroxysmal nocturnal dyspnea[ ]  Pulmonary: Cough [ ]; Wheezing[ ]; Hemoptysis[ ]; Sputum [ ]; Snoring [ ]  GI: Vomiting[ ]; Dysphagia[ ]; Melena[ ]; Hematochezia [ ]; Heartburn[ ]; Abdominal pain [ ]; Constipation [ ]; Diarrhea [ ]; BRBPR [ ]  GU: Hematuria[ ]; Dysuria [ ]; Nocturia[ ]  Vascular: Pain in legs with walking [ ]; Pain in feet with lying flat [ ]; Non-healing sores [ ]; Stroke [ ]; TIA [ ]; Slurred speech [ ];  Neuro: Headaches[ ]; Vertigo[ ]; Seizures[ ]; Paresthesias[ U];Blurred vision [ ]; Diplopia [ ]; Vision changes [ ]  Ortho/Skin: Arthritis [ ]; Joint pain [ ]; Muscle pain [ ]; Joint swelling [ ]; Back  Pain [ ]; Rash [ ]  Psych: Depression[ ]; Anxiety[ ]  Heme: Bleeding problems [ ]; Clotting disorders [ ]; Anemia [ ]  Endocrine: Diabetes [ Y]; Thyroid dysfunction[ ]   Past Medical History:  Diagnosis Date   Anticoagulant long-term use    Arthritis    Arthropathy of left shoulder    Atypical mole    Biceps tendonitis, left    Bursitis of left shoulder    Cardiac pacemaker in situ    first  insertion 2008;  genertor and new lead change 03-08-2012;   [employer]   CHB (complete heart block) ([place])    s/p  PPM 2008   Chronic back pain    CKD (chronic kidney disease), stage III ([place])    Coronary artery disease cardiologist--- dr croitoru   hx  CABG x2  1990 and re-do 2004;  multiple PCI to [place] to 2004;    cardiac cath 2012  occluded LAD and RCA with patent grafts   Difficult intubation    " Dr. Sallyanne Kuster said that it was difficulty to get the tube in."   History of coronary angioplasty    multiple PCI to RCA ,  1990 to 2004   History of GI bleed followed by GI-- Olevia Perches PA @ [facility] [place]   01/ 2020  upper and lower GI bleed ;  s/p  EGD with cautery of jejunal bleed and blood transfusion's   History of kidney stones    Hx of echocardiogram 06/29/2008   EF 45-50%    Hypertension    Ischemic cardiomyopathy    previously reported , ef 40-45%;  2010--- ef 45-50%;   last echo 2017 55-60%   Mixed hyperlipidemia    Nocturia    OSA on CPAP    cpap, 13, last sleep study Jan2013, sees Dr. Keturah Barre   PAF (paroxysmal atrial fibrillation) ([place])    long hx PAF--- followed by dr croitoru   Post-polio syndrome    dx polio age 3;   09-30-2018  per pt occasional gait issues and occasion fall   Presence of permanent cardiac pacemaker    Pseudoarthrosis of lumbar spine    Rotator cuff tear, left    S/P CABG x 2    1990 x2  and 2004  x2   Sinus node dysfunction ([place])    2015--- sinus node arrest  with intact AV conduction   Squamous cell carcinoma of skin    Type 2 diabetes mellitus ([place])    followed by pcp    Current Outpatient Medications  Medication Sig Dispense Refill   acetaminophen (TYLENOL) 500 MG tablet Take 1,000 mg by mouth every 6 (six) hours as needed for mild pain or headache.     amiodarone (PACERONE) 200 MG tablet Take 1 tablet (200 mg total) by mouth daily. Keep upcoming appointment 30 tablet 0   carvedilol (COREG) 25 MG tablet Take 1  tablet (25 mg total) by mouth 2 (two) times daily with a meal. 120 tablet 3   ferrous sulfate 325 (65 FE) MG tablet Take 1 tablet (325 mg total) by mouth daily with breakfast. (Patient taking differently: Take 325 mg by mouth 2 (two) times daily with a meal.) 30 tablet 0   Fluorouracil (TOLAK) 4 % CREA Apply to affected area nightly x 2 weeks 40 g 0   glipiZIDE (GLUCOTROL) 10 MG tablet Take 10 mg by mouth 2 (two) times daily.     insulin lispro (HUMALOG) 100  2-8 Units into the skin as needed for high blood sugar.     ° losartan (COZAAR) 100 MG tablet Take 100 mg by mouth daily.     ° Multiple Vitamin (MULTIVITAMIN WITH MINERALS) TABS tablet Take 1 tablet by mouth daily.     ° pantoprazole (PROTONIX) 40 MG tablet Take 1 tablet (40 mg total) by mouth daily. 30 tablet 0  ° torsemide (DEMADEX) 20 MG tablet Take 1 tablet (20 mg total) by mouth daily. 30 tablet 1  ° °No current facility-administered medications for this encounter.  ° ° °No Known Allergies ° °  °Social History  ° °Socioeconomic History  ° Marital status: Single  °  Spouse name: Not on file  ° Number of children: 1  ° Years of education: 16  ° Highest education level: Associate degree: academic program  °Occupational History  ° Occupation: pilot-retired  °Tobacco Use  ° Smoking status: Never  ° Smokeless tobacco: Never  °Vaping Use  ° Vaping Use: Never used  °Substance and Sexual Activity  ° Alcohol use: Not Currently  ° Drug use: Never  ° Sexual activity: Yes  °Other Topics Concern  ° Not on file  °Social History Narrative  ° [place] [address] [place], [place].   ° Right-handed.  ° 1 cup caffeine per day.  ° °Social Determinants of Health  ° °Financial Resource Strain: Low Risk   ° Difficulty of Paying Living Expenses: Not hard at all  °Food Insecurity: No Food Insecurity  ° Worried About Running Out of Food in the Last Year: Never true  ° Ran Out of Food in the Last Year: Never true  °Transportation Needs: No  Transportation Needs  ° Lack of Transportation (Medical): No  ° Lack of Transportation (Non-Medical): No  °Physical Activity: Not on file  °Stress: Not on file  °Social Connections: Not on file  °Intimate Partner Violence: Not on file  ° ° °  °Family History  °Problem Relation Age of Onset  ° Other Father   °     MVA  ° Cancer Mother   ° ° °Vitals:  ° 02/11/21 1524  °BP: (!) 145/80  °Pulse: 88  °SpO2: 98%  °Weight: 99.8 kg (220 lb)  ° ° °PHYSICAL EXAM: °General:  Well appearing. No respiratory difficulty °HEENT: normal °Neck: supple. no JVD. Carotids 2+ bilat; no bruits. No lymphadenopathy or thryomegaly appreciated. °Cor: PMI nondisplaced. Regular rate & rhythm. No rubs, gallops or murmurs. °Lungs: clear °Abdomen: soft, nontender, nondistended. No hepatosplenomegaly. No bruits or masses. Good bowel sounds. °Extremities: no cyanosis, clubbing, rash, edema °Neuro: alert & oriented x 3, cranial nerves grossly intact. moves all 4 extremities w/o difficulty. Affect pleasant. ° °ECG: Not performed  ° ° °ASSESSMENT & PLAN: ° °Chronic Diastolic Heart Failure °- Echo 12/22 EF 55-60%, mild concentric LVH, RV normal °- concern for possible amyloid given h/o of conduction disease including Afib/Flutter and CHB necessitating PPM as well as bilateral carpal tunnel requiring b/l carpal tunnel release, h/o biceps tendon rupture s/p biceps tenodesis and lumbar spinal stenosis, all of which could be musculoskeletal manifestations of amyloid  °- Check PYP (unable to complete cMRI w/ PPM and CKD) °- check Multiple Myeloma Panel, UPEP/SPEP °- NYHA Class II. Evuolemic on exam.   °- Continue torsemide 20 mg daily  °- Jardiance 10 mg daily was added by PCP earlier today (note in care everywhere) °- Coreg 25 mg bid  ° °2. CAD: °- initial CABG in 1990 w/   redo in 2004 °- stable w/o CP  ° °3. CKD, Stage IV  °- recent progression from IIIB-IV °- prior baseline SCr ~1.7 °- new baseline ~2.2, GFR 29  °- refer to outpatient nephrology  °- amyloid  w/u per above  °- Jardiance 10 mg daily  ° °4. AFib/ Flutter  °- most recent EKG showed atrial paced rhtyhm °- off a/c due to h/o GIBs °- ? surgical clip on the left atrial appendage at time of CABG. Not candidate for WatchMan (see note from Dr. Cooper)  ° °5. Type 2DM  °- on Jardiance °- per PCP  ° ° °NYHA II °GDMT  °Diuretic- torsemide 20 mg daily  °BB- Coreg 25 mg bid  °Ace/ARB/ARNI - losartan 100 mg daily  °MRA - no CKD °SGLT2i Jardiance 10  ° ° ° °Referred to HFSW (PCP, Medications, Transportation, ETOH Abuse, Drug Abuse, Insurance, Financial ): No  °Refer to Pharmacy: No °Refer to Home Health:  No °Refer to Advanced Heart Failure Clinic: Yes °Refer to General Cardiology: Yes (shared care)  ° °Follow up in 4 weeks in [facility] w/ APP after PYP. If + PYP scan, assign to Dr. McLean  ° °

## 2021-02-10 NOTE — Telephone Encounter (Signed)
Called to confirm Heart & Vascular Transitions of Care appointment at 3pm tomorrow 1/6. Patient reminded to bring all medications and pill box organizer with them. Confirmed patient has transportation. Gave directions, instructed to utilize [facility] parking.  Confirmed appointment prior to ending call.   Pricilla Holm, MSN, RN Heart Failure Nurse Navigator [phone]

## 2021-02-11 ENCOUNTER — Encounter (HOSPITAL_COMMUNITY): Payer: Self-pay

## 2021-02-11 ENCOUNTER — Ambulatory Visit (HOSPITAL_COMMUNITY)
Admit: 2021-02-11 | Discharge: 2021-02-11 | Disposition: A | Discharge status: Home / Self Care | Payer: Medicare HMO | Attending: Cardiology | Admitting: Cardiology

## 2021-02-11 ENCOUNTER — Other Ambulatory Visit: Payer: Self-pay

## 2021-02-11 VITALS — BP 145/80 | HR 88 | Wt 220.0 lb

## 2021-02-11 DIAGNOSIS — I2510 Atherosclerotic heart disease of native coronary artery without angina pectoris: Secondary | ICD-10-CM | POA: Insufficient documentation

## 2021-02-11 DIAGNOSIS — I130 Hypertensive heart and chronic kidney disease with heart failure and stage 1 through stage 4 chronic kidney disease, or unspecified chronic kidney disease: Secondary | ICD-10-CM | POA: Diagnosis present

## 2021-02-11 DIAGNOSIS — I442 Atrioventricular block, complete: Secondary | ICD-10-CM | POA: Diagnosis not present

## 2021-02-11 DIAGNOSIS — I5032 Chronic diastolic (congestive) heart failure: Secondary | ICD-10-CM | POA: Diagnosis not present

## 2021-02-11 DIAGNOSIS — N184 Chronic kidney disease, stage 4 (severe): Secondary | ICD-10-CM | POA: Diagnosis not present

## 2021-02-11 DIAGNOSIS — Z951 Presence of aortocoronary bypass graft: Secondary | ICD-10-CM | POA: Insufficient documentation

## 2021-02-11 DIAGNOSIS — E1122 Type 2 diabetes mellitus with diabetic chronic kidney disease: Secondary | ICD-10-CM | POA: Diagnosis not present

## 2021-02-11 DIAGNOSIS — Z955 Presence of coronary angioplasty implant and graft: Secondary | ICD-10-CM | POA: Diagnosis not present

## 2021-02-11 DIAGNOSIS — Z950 Presence of cardiac pacemaker: Secondary | ICD-10-CM | POA: Insufficient documentation

## 2021-02-11 DIAGNOSIS — I4892 Unspecified atrial flutter: Secondary | ICD-10-CM | POA: Diagnosis not present

## 2021-02-11 DIAGNOSIS — Z7984 Long term (current) use of oral hypoglycemic drugs: Secondary | ICD-10-CM | POA: Diagnosis not present

## 2021-02-11 DIAGNOSIS — I480 Paroxysmal atrial fibrillation: Secondary | ICD-10-CM | POA: Insufficient documentation

## 2021-02-11 DIAGNOSIS — Z79899 Other long term (current) drug therapy: Secondary | ICD-10-CM | POA: Insufficient documentation

## 2021-02-11 LAB — BASIC METABOLIC PANEL
Anion gap: 8 (ref 5–15)
BUN: 47 mg/dL — ABNORMAL HIGH (ref 8–23)
CO2: 23 mmol/L (ref 22–32)
Calcium: 9.2 mg/dL (ref 8.9–10.3)
Chloride: 104 mmol/L (ref 98–111)
Creatinine, Ser: 2.37 mg/dL — ABNORMAL HIGH (ref 0.61–1.24)
GFR, Estimated: 27 mL/min — ABNORMAL LOW (ref >60)
Glucose, Bld: 170 mg/dL — ABNORMAL HIGH (ref 70–99)
Potassium: 4 mmol/L (ref 3.5–5.1)
Sodium: 135 mmol/L (ref 135–145)

## 2021-02-11 NOTE — Patient Instructions (Addendum)
Labs done today. We will contact you only if your labs are abnormal.  No medication changes were made. Please continue all current medications as prescribed.  Your provider has requested that you have a PYP scan done. This has to be appointment has to be approved through your insurance company prior to scheduling, once approved we will contact you to schedule an appointment.  Your physician recommends that you schedule a follow-up appointment in: 4 weeks with Dr. Aundra Dubin here in our office.  If you have any questions or concerns before your next appointment please send [place] a message through [place] or call our office at [phone].    TO LEAVE A MESSAGE FOR THE NURSE SELECT OPTION 2, PLEASE LEAVE A MESSAGE INCLUDING: YOUR NAME DATE OF BIRTH CALL BACK NUMBER REASON FOR CALL**this is important as we prioritize the call backs  YOU WILL RECEIVE A CALL BACK THE SAME DAY AS LONG AS YOU CALL BEFORE 4:00 PM   Do the following things EVERYDAY: Weigh yourself in the morning before breakfast. Write it down and keep it in a log. Take your medicines as prescribed Eat low salt foods--Limit salt (sodium) to 2000 mg per day.  Stay as active as you can everyday Limit all fluids for the day to less than 2 liters   At the [facility], you and your health needs are our priority. As part of our continuing mission to provide you with exceptional heart care, we have created designated Provider Care Teams. These Care Teams include your primary Cardiologist (physician) and Advanced Practice Providers (APPs- Physician Assistants and Nurse Practitioners) who all work together to provide you with the care you need, when you need it.   You may see any of the following providers on your designated Care Team at your next follow up: Dr Glori Bickers Dr Haynes Kerns, NP Lyda Jester, [place] Audry Riles, PharmD   Please be sure to bring in all your medications bottles to every  appointment.

## 2021-02-14 LAB — IMMUNOFIXATION, URINE

## 2021-02-14 LAB — KAPPA/LAMBDA LIGHT CHAINS
Kappa free light chain: 18.9 mg/L (ref 3.3–19.4)
Kappa, lambda light chain ratio: 4.85 — ABNORMAL HIGH (ref 0.26–1.65)
Lambda free light chains: 3.9 mg/L — ABNORMAL LOW (ref 5.7–26.3)

## 2021-02-18 LAB — MULTIPLE MYELOMA PANEL, SERUM
Albumin SerPl Elph-Mcnc: 3.7 g/dL (ref 2.9–4.4)
Albumin/Glob SerPl: 1 (ref 0.7–1.7)
Alpha 1: 0.2 g/dL (ref 0.0–0.4)
Alpha2 Glob SerPl Elph-Mcnc: 0.7 g/dL (ref 0.4–1.0)
B-Globulin SerPl Elph-Mcnc: 0.8 g/dL (ref 0.7–1.3)
Gamma Glob SerPl Elph-Mcnc: 2.2 g/dL — ABNORMAL HIGH (ref 0.4–1.8)
Globulin, Total: 4 g/dL — ABNORMAL HIGH (ref 2.2–3.9)
IgA: 152 mg/dL (ref 61–437)
IgG (Immunoglobin G), Serum: 2121 mg/dL — ABNORMAL HIGH (ref 603–1613)
IgM (Immunoglobulin M), Srm: 21 mg/dL (ref 15–143)
M Protein SerPl Elph-Mcnc: 1.7 g/dL — ABNORMAL HIGH
Total Protein ELP: 7.7 g/dL (ref 6.0–8.5)

## 2021-02-20 NOTE — Telephone Encounter (Signed)
Patient has an appointment 2/6 with Dr. Sallyanne Kuster.

## 2021-03-04 ENCOUNTER — Encounter (HOSPITAL_COMMUNITY)
Admission: RE | Admit: 2021-03-04 | Discharge: 2021-03-04 | Disposition: A | Discharge status: Home / Self Care | Payer: Medicare HMO | Source: Ambulatory Visit | Attending: Cardiology | Admitting: Cardiology

## 2021-03-04 ENCOUNTER — Other Ambulatory Visit: Payer: Self-pay

## 2021-03-04 DIAGNOSIS — I5032 Chronic diastolic (congestive) heart failure: Secondary | ICD-10-CM | POA: Diagnosis present

## 2021-03-04 MED ORDER — TECHNETIUM TC 99M PYROPHOSPHATE
20.4000 | Freq: Once | INTRAVENOUS | Status: AC | PRN
  Administered 2021-03-04: 20.4 via INTRAVENOUS
  Filled 2021-03-04: qty 21

## 2021-03-11 ENCOUNTER — Encounter: Payer: Self-pay | Admitting: Cardiology

## 2021-03-11 ENCOUNTER — Ambulatory Visit (HOSPITAL_COMMUNITY)
Admission: RE | Admit: 2021-03-11 | Discharge: 2021-03-11 | Disposition: A | Discharge status: Home / Self Care | Payer: Medicare HMO | Source: Ambulatory Visit | Attending: Cardiology | Admitting: Cardiology

## 2021-03-11 ENCOUNTER — Other Ambulatory Visit (HOSPITAL_COMMUNITY): Payer: Self-pay

## 2021-03-11 ENCOUNTER — Other Ambulatory Visit: Payer: Self-pay

## 2021-03-11 VITALS — BP 125/82 | HR 70 | Wt 217.0 lb

## 2021-03-11 DIAGNOSIS — I444 Left anterior fascicular block: Secondary | ICD-10-CM | POA: Diagnosis not present

## 2021-03-11 DIAGNOSIS — I5022 Chronic systolic (congestive) heart failure: Secondary | ICD-10-CM | POA: Diagnosis not present

## 2021-03-11 DIAGNOSIS — I440 Atrioventricular block, first degree: Secondary | ICD-10-CM | POA: Insufficient documentation

## 2021-03-11 DIAGNOSIS — S46219A Strain of muscle, fascia and tendon of other parts of biceps, unspecified arm, initial encounter: Secondary | ICD-10-CM | POA: Insufficient documentation

## 2021-03-11 DIAGNOSIS — N184 Chronic kidney disease, stage 4 (severe): Secondary | ICD-10-CM | POA: Diagnosis not present

## 2021-03-11 DIAGNOSIS — I452 Bifascicular block: Secondary | ICD-10-CM | POA: Insufficient documentation

## 2021-03-11 DIAGNOSIS — I255 Ischemic cardiomyopathy: Secondary | ICD-10-CM | POA: Diagnosis not present

## 2021-03-11 DIAGNOSIS — I130 Hypertensive heart and chronic kidney disease with heart failure and stage 1 through stage 4 chronic kidney disease, or unspecified chronic kidney disease: Secondary | ICD-10-CM | POA: Insufficient documentation

## 2021-03-11 DIAGNOSIS — Z79899 Other long term (current) drug therapy: Secondary | ICD-10-CM | POA: Insufficient documentation

## 2021-03-11 DIAGNOSIS — I5032 Chronic diastolic (congestive) heart failure: Secondary | ICD-10-CM | POA: Insufficient documentation

## 2021-03-11 DIAGNOSIS — X58XXXA Exposure to other specified factors, initial encounter: Secondary | ICD-10-CM | POA: Insufficient documentation

## 2021-03-11 DIAGNOSIS — I25708 Atherosclerosis of coronary artery bypass graft(s), unspecified, with other forms of angina pectoris: Secondary | ICD-10-CM

## 2021-03-11 DIAGNOSIS — Z7984 Long term (current) use of oral hypoglycemic drugs: Secondary | ICD-10-CM | POA: Insufficient documentation

## 2021-03-11 DIAGNOSIS — G5603 Carpal tunnel syndrome, bilateral upper limbs: Secondary | ICD-10-CM | POA: Insufficient documentation

## 2021-03-11 DIAGNOSIS — D472 Monoclonal gammopathy: Secondary | ICD-10-CM | POA: Insufficient documentation

## 2021-03-11 DIAGNOSIS — I480 Paroxysmal atrial fibrillation: Secondary | ICD-10-CM | POA: Diagnosis not present

## 2021-03-11 DIAGNOSIS — Z951 Presence of aortocoronary bypass graft: Secondary | ICD-10-CM | POA: Insufficient documentation

## 2021-03-11 DIAGNOSIS — I2510 Atherosclerotic heart disease of native coronary artery without angina pectoris: Secondary | ICD-10-CM | POA: Diagnosis not present

## 2021-03-11 DIAGNOSIS — Z950 Presence of cardiac pacemaker: Secondary | ICD-10-CM | POA: Diagnosis not present

## 2021-03-11 DIAGNOSIS — I4510 Unspecified right bundle-branch block: Secondary | ICD-10-CM | POA: Insufficient documentation

## 2021-03-11 LAB — COMPREHENSIVE METABOLIC PANEL
ALT: 34 U/L (ref 0–44)
AST: 28 U/L (ref 15–41)
Albumin: 3.8 g/dL (ref 3.5–5.0)
Alkaline Phosphatase: 87 U/L (ref 38–126)
Anion gap: 9 (ref 5–15)
BUN: 42 mg/dL — ABNORMAL HIGH (ref 8–23)
CO2: 19 mmol/L — ABNORMAL LOW (ref 22–32)
Calcium: 9.6 mg/dL (ref 8.9–10.3)
Chloride: 106 mmol/L (ref 98–111)
Creatinine, Ser: 2.21 mg/dL — ABNORMAL HIGH (ref 0.61–1.24)
GFR, Estimated: 30 mL/min — ABNORMAL LOW (ref >60)
Glucose, Bld: 101 mg/dL — ABNORMAL HIGH (ref 70–99)
Potassium: 4.7 mmol/L (ref 3.5–5.1)
Sodium: 134 mmol/L — ABNORMAL LOW (ref 135–145)
Total Bilirubin: 0.4 mg/dL (ref 0.3–1.2)
Total Protein: 8.3 g/dL — ABNORMAL HIGH (ref 6.5–8.1)

## 2021-03-11 LAB — BRAIN NATRIURETIC PEPTIDE: B Natriuretic Peptide: 102 pg/mL — ABNORMAL HIGH (ref 0.0–100.0)

## 2021-03-11 LAB — TSH: TSH: 1.019 u[IU]/mL (ref 0.350–4.500)

## 2021-03-11 MED ORDER — TORSEMIDE 20 MG PO TABS: ORAL_TABLET | ORAL | 1 refills | Status: DC

## 2021-03-11 NOTE — Progress Notes (Signed)
ReDS Vest / Clip - 03/11/21 1500       ReDS Vest / Clip   Station Marker C    Ruler Value 33    ReDS Value Range Low volume    ReDS Actual Value 31

## 2021-03-11 NOTE — Patient Instructions (Addendum)
Medication Changes:  Increase Torsemide to 40 mg (2 tabs) daily every other day ALTERNATING with 20 mg (1 tab) daily every other day  Lab Work:  Labs done today, your results will be available in MyChart, we will contact you for abnormal readings.  Your physician recommends that you return for lab work in: 10 days (around 03/21/21), we have placed an order for you to have this done at [facility]  Testing/Procedures:  None  Referrals:  You have been referred to Hematology, they are located in the [place] at [facility], they will call you for an appointment  Special Instructions // Education:  Do the following things EVERYDAY: Weigh yourself in the morning before breakfast. Write it down and keep it in a log. Take your medicines as prescribed Eat low salt foods--Limit salt (sodium) to 2000 mg per day.  Stay as active as you can everyday Limit all fluids for the day to less than 2 liters  Follow-Up in: 1 month  At the [facility], you and your health needs are our priority. We have a designated team specialized in the treatment of Heart Failure. This Care Team includes your primary Heart Failure Specialized Cardiologist (physician), Advanced Practice Providers (APPs- Physician Assistants and Nurse Practitioners), and Pharmacist who all work together to provide you with the care you need, when you need it.   You may see any of the following providers on your designated Care Team at your next follow up:  Dr Glori Bickers Dr Haynes Kerns, NP Lyda Jester, [place] [facility] [place], [place] Audry Riles, PharmD   Please be sure to bring in all your medications bottles to every appointment.   Need to Contact us:  If you have any questions or concerns before your next appointment please send [place] a message through [place] or call our office at [phone].    TO LEAVE A MESSAGE FOR THE NURSE SELECT OPTION 2, PLEASE LEAVE A  MESSAGE INCLUDING: YOUR NAME DATE OF BIRTH CALL BACK NUMBER REASON FOR CALL**this is important as we prioritize the call backs  YOU WILL RECEIVE A CALL BACK THE SAME DAY AS LONG AS YOU CALL BEFORE 4:00 PM

## 2021-03-13 NOTE — Progress Notes (Addendum)
PCP: Alan Noon, MD Cardiology: Dr. Sallyanne Kuster HF Cardiology: Dr. Aundra Dubin  81 y.o. with history of complete heart block s/p pacemaker, CAD s/p CABG and redo, CKD stage 3, GI bleeding, and chronic diastolic CHF was referred to CHF clinic from the [facility] clinic. Patient has a long history of CAD. He had his initial CABG in 1990 then redo in 2004. Last cath in 11/20 showed patent sequential LIMA-LAD/D and SVG-PDA, medical management.  He has a [employer] PPM due to complete heart block, has had this for years.  He has paroxysmal atrial fibrillation, in NSR today. He is not on anticoagulation due to GI bleeding.  He has not had suitable anatomy for Watchman due to surgical clipping with partial closure of the LA appendage.   Patient has struggled recently with diastolic CHF.  He was admitted in 12/22 with CHF exacerbation.  Echo in 12/22 showed EF 55-60%, mild LVH, normal RV, moderate LAE. With diastolic CHF, PAF, biceps tendon rupture, and bilateral carpal tunnel syndrome, cardiac amyloidosis was considered.  PYP scan was not suggestive of TTR cardiac amyloidosis. 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   Patient has been doing relatively well over the last few weeks on torsemide and Jardiance.  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 He is short of breath walking up a flight of stairs.  No chest pain.  No orthopnea/PND.  No lightheadedness. Weight is down 3 lbs from last visit.   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  MDT device interrogation: 16.5% RV-pacing  PMH: 1. Atrial fibrillation: Paroxysmal 2. Complete heart block: MDT PPM placed in his 50s.  3. Biceps tendon rupture 4. 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 HTN 7. Hyperlipidemia: h/o statin myopathy, muscle pain with Zetia, PCSK9 has been too expensive.  8. CKD stage 4 9. H/o GI bleeding: Not suitable Watchman candidate due to partial closure of the appendage by surgical clip.  He is not anticoagulated.  10. Chronic diastolic CHF: Echo (03/47) with EF 55-60%, mild LVH, normal RV, moderate LAE.  - PYP scan (1/23): grade 1, H/CL 0.8 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  12. CAD: CABG x 2 in 1990, redo CABG x 2 in 2004.  - LHC (11/20): occluded proximal LAD, 99% dLCx, occluded proximal RCA, LIMA-LAD/diagonal patent, SVG-PDA patent. Distal LCx was small, managed medically.  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   SH: Retired Insurance underwriter, also was a Technical brewer.  Lives in [place].  Nonsmoker.  Rare ETOH.   Family History  Problem Relation Age of Onset   Other Father        MVA   Cancer Mother    ROS: All systems reviewed and negative except as per HPI.   Current Outpatient Medications  Medication Sig Dispense Refill   acetaminophen (TYLENOL) 500 MG tablet Take 1,000 mg by mouth every 6 (six) hours as needed for mild pain or headache.     amiodarone (PACERONE) 200 MG tablet Take 1 tablet (200 mg total) by mouth daily. Keep upcoming appointment 30 tablet 0   carvedilol (COREG) 25 MG tablet Take 1 tablet (25 mg total) by mouth 2 (two) times daily with a meal. 120 tablet 3   empagliflozin (JARDIANCE) 10 MG TABS tablet Take  10 mg by mouth daily.     ferrous sulfate 325 (65 FE) MG tablet Take 1 tablet (325 mg total) by mouth daily with breakfast. 30 tablet 0   Fluorouracil (TOLAK) 4 % CREA Apply to affected area nightly x 2 weeks 40 g 0   glipiZIDE (GLUCOTROL) 10 MG tablet Take 10 mg by mouth 2 (two) times daily.     insulin lispro (HUMALOG) 100 UNIT/ML injection Inject 2-8 Units into the skin as needed for high blood sugar.      losartan (COZAAR) 100 MG tablet Take 100 mg by mouth daily.       Multiple Vitamin (MULTIVITAMIN WITH MINERALS) TABS tablet Take 1 tablet by mouth daily.      pantoprazole (PROTONIX) 40 MG tablet Take 1 tablet (40 mg total) by mouth daily. 30 tablet 0   torsemide (DEMADEX) 20 MG tablet Take 2 tabs Daily every other day ALTERNATING with 1 tab daily every other day 60 tablet 1   No current facility-administered medications for this encounter.   BP 125/82    Pulse 70    Wt 98.4 kg (217 lb)    SpO2 98%    BMI 31.14 kg/m  General: NAD Neck: JVP 8-9 cm with HJR, no thyromegaly or thyroid nodule.  Lungs: Clear to auscultation bilaterally with normal respiratory effort. CV: Nondisplaced PMI.  Heart regular S1/S2, no S3/S4, no murmur.  1+ edema to knees.  No carotid bruit.  Normal pedal pulses.  Abdomen: Soft, nontender, no hepatosplenomegaly, no distention.  Skin: Intact without lesions or rashes.  Neurologic: Alert and oriented x 3.  Psych: Normal affect. Extremities: No clubbing or cyanosis.  HEENT: Normal.   Assessment/Plan: 1. Chronic diastolic CHF: Echo 22/97 showed EF 55-60%, mild LVH, normal RV, moderate LAE.  There was concern for cardiac amyloidosis with bilateral carpal tunnel syndrome and biceps tendon rupture.  PYP scan was not suggestive of TTR cardiac amyloidosis, but myeloma panel and urine immunofixation were suggestive of monoclonal gammopathy.  NYHA class II symptoms.  He is mildly volume overloaded on exam.  - Increase torsemide to 40 daily alternating with 20 daily.  BMET/BNP today, BMET 10 days.  - Continue Jardiance 10 mg daily.  - At next appt, will discuss cardiac MRI for AL amyloidosis though this generally tends to be more rapidly progressive than his disease appears to be.  2. Monoclonal gammopathy: I will make referral to hematology for him.  3. CAD: S/p CABG and redo.  Cath in 11/20 showed patent grafts.  He denies chest pain.  - He is not on ASA due to GI bleeding => ?consider Plavix versus ASA.  - He is not on statin due to history  of statin myopathy and inability to tolerate Zetia.  PCSK9 inhibitors were too expensive in the past and he is not interested in looking into them again.  4. HTN: BP controlled on Coreg 25 mg bid and losartan 100 mg daily.  5. Atrial fibrillation: Paroxysmal.  He is in NSR today.  He is not on anticoagulation due to history of GI bleeding.  - Continue amiodarone 200 daily.  Check LFTs and TSH today, he will need a regular eye exam.  - He is not a Watchman candidate, he has a partially closed LA appendage from surgical clip.  6. H/o complete heart block: He has a MDT PPM, 16.7% RV pacing on interrogation today.  7. CKD: Stage 4.  BMET today.   Alan [name] 03/13/2021

## 2021-03-14 ENCOUNTER — Other Ambulatory Visit: Payer: Self-pay

## 2021-03-14 ENCOUNTER — Ambulatory Visit (INDEPENDENT_AMBULATORY_CARE_PROVIDER_SITE_OTHER): Payer: Medicare HMO | Admitting: Cardiovascular Disease

## 2021-03-14 ENCOUNTER — Encounter: Payer: Self-pay | Admitting: Cardiovascular Disease

## 2021-03-14 VITALS — BP 130/62 | HR 85 | Ht 70.0 in | Wt 216.2 lb

## 2021-03-14 DIAGNOSIS — I5032 Chronic diastolic (congestive) heart failure: Secondary | ICD-10-CM | POA: Diagnosis not present

## 2021-03-14 DIAGNOSIS — I25708 Atherosclerosis of coronary artery bypass graft(s), unspecified, with other forms of angina pectoris: Secondary | ICD-10-CM

## 2021-03-14 DIAGNOSIS — I495 Sick sinus syndrome: Secondary | ICD-10-CM | POA: Diagnosis not present

## 2021-03-14 DIAGNOSIS — I4729 Other ventricular tachycardia: Secondary | ICD-10-CM

## 2021-03-14 DIAGNOSIS — G720 Drug-induced myopathy: Secondary | ICD-10-CM

## 2021-03-14 DIAGNOSIS — I4819 Other persistent atrial fibrillation: Secondary | ICD-10-CM

## 2021-03-14 DIAGNOSIS — I442 Atrioventricular block, complete: Secondary | ICD-10-CM

## 2021-03-14 DIAGNOSIS — T466X5D Adverse effect of antihyperlipidemic and antiarteriosclerotic drugs, subsequent encounter: Secondary | ICD-10-CM

## 2021-03-14 DIAGNOSIS — E1122 Type 2 diabetes mellitus with diabetic chronic kidney disease: Secondary | ICD-10-CM

## 2021-03-14 DIAGNOSIS — E7800 Pure hypercholesterolemia, unspecified: Secondary | ICD-10-CM

## 2021-03-14 DIAGNOSIS — Z950 Presence of cardiac pacemaker: Secondary | ICD-10-CM

## 2021-03-14 DIAGNOSIS — I10 Essential (primary) hypertension: Secondary | ICD-10-CM

## 2021-03-14 DIAGNOSIS — Z794 Long term (current) use of insulin: Secondary | ICD-10-CM

## 2021-03-14 DIAGNOSIS — N184 Chronic kidney disease, stage 4 (severe): Secondary | ICD-10-CM

## 2021-03-14 LAB — PACEMAKER DEVICE OBSERVATION

## 2021-03-14 NOTE — Progress Notes (Signed)
Cardiology Office Note    Date:  03/16/2021   ID:  Alan [name], DOB [birth year], MRN [id number]  PCP:  [name], MD  Cardiologist:  Sanda Klein, MD   Chief Complaint  Patient presents with   Pacemaker Check   Congestive Heart Failure         History of Present Illness:  Alan [name] is a 81 y.o. male with coronary artery disease, previous CABG, sinus node arrest, status post dual-chamber permanent pacemaker ("atrially dependent"), history of atrial fibrillation status post remote ablation in [facility], left atrial appendage clipping (with a residual permeable lobe of the left atrial appendage), type 2 diabetes mellitus and dyslipidemia, obstructive sleep apnea on CPAP, here for follow up.  Alan [name] was hospitalized with an episode of heart failure exacerbation just before New Year's.  This happened after his dose of diuretics was decreased and eventually diuretics were stopped for worsening creatinine.  Creatinine was as high as 2.3.  He was admitted with a greater than 10 pound weight gain and required intravenous diuretics, discharged home on torsemide 20 mg daily.  Losartan had been discontinued, but was subsequently restarted.Alan [name] has been started.  Switched to torsemide for his loop diuretic.  Echocardiogram showed that he maintains normal left ventricular systolic function with an ejection fraction of 55-60%. There were no major valvular abnormalities.  He did not have atrial fibrillation.    He underwent a pyrophosphate scan that showed no evidence of amyloidosis.  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  He has an appointment scheduled in the hematology clinic with Dr. Irene Limbo on 04/04/2021.  He has been referred to [facility], but has not had an appointment yet.  He had a follow-up visit.  BNP during his hospitalization was moderately elevated at 185 (6  months ago it was 250) and last week on 03/11/2021 was down to 102.  Creatinine was 2.21 which probably represents his new baseline (GFR 25-30).  He was seen in follow-up in the advanced heart failure clinic by Dr. Aundra Dubin on 03/11/2021.  He had NYHA functional class II symptoms and appears mildly volume overloaded by exam.  His torsemide was increased to 40 mg daily alternating with 20 mg daily.  The BNP was only 102.  Pacemaker interrogation shows that he has not had any recent atrial fibrillation (no sustained glycemic control is good with a recent hemoglobin A1c of 6.4%.  Lipid parameters are in target range.  A-fib since his cardioversion in 2020).  He is "atrially dependent" with 99.8% atrial pacing and only 16.4% ventricular pacing.  Although he has intact AV conduction, he does not appear to have an effective junctional or ventricular escape rhythm.  Pacemaker function is normal.  Lead parameters are good and stable.  Estimated generator longevity is 5.5 years.  His pacemaker is not MRI conditional (Medtronic Adapta implanted 2014).  He felt poorly while in atypical atrial flutter/atrial fibrillation in September-December 2020 (the atrial arrhythmia was accompanied by a marked increase in ventricular pacing, which he does not tolerate well when we test his device in the office).  On 01/15/2019, he underwent TEE that showed that although his left atrial appendage was surgically clipped, there did appear to be a permeable lobe (without thrombus).  Atrial overdrive pacing via the dual-chamber pacemaker was unsuccessful.  He subsequently underwent cardioversion successfully.  This led to improvement in symptoms.  He took  warfarin for a while after his cardioversion, but has stopped it since then.  He does not know why.  He could not afford direct oral anticoagulant such as Eliquis.  Pacemaker interrogation (Medtronic Adapta 2014, estimated generator longevity another 6 years) shows that he has not had any  meaningful episodes of true atrial fibrillation since his cardioversion in December 2020, although he has had some organized atrial tachycardia.  The overall burden of atrial tachycardia has been less than 0.1%, with the longest episode lasting only 11 minutes.  There have been no episodes of high ventricular rate.  He is "atrial lead pacemaker dependent" and has 99.6% atrial pacing and only 16% ventricular pacing.  Lead parameters look fine.  Most recent hemoglobin A1c was 6.1%.  LDL in November was 23, but in December was up to 84.  Unsure why the variation.  In September, iron saturation was borderline at 15% but the ferritin was normal at 33.  Liver function test and TSH were normal on 03/11/2021.  She had bilateral carpal tunnel surgery with Dr. Amedeo Plenty bilaterally.  He complains of a weaker grasp in his right hand and the fact that his fourth finger seems to lag behind the others.  I recommended that he discuss this again with Dr. Amedeo Plenty.  Cardiac catheterization in November 2020 showed unchanged anatomy (the native LAD and RCA arteries are chronically occluded, but have good arterial supply via the LIMA and SVG bypasses, respectively, both appearing free of disease.  There was a 99% stenosis in the distal left circumflex coronary artery, too small for PCI.  The LVEF was 50-55%, but the LVEDP was elevated at 18 mmHg.   He has a longstanding history of cardiac problems. He had CABG in 1990 (sequential LIMA to LAD and diagonal), multiple RCA PCI 1990-2004 and a redo CABG in 2004 (SVG-RCA-PDA, with surgical MAZE procedure, Dr. Amador Cunas). Cath 2007 and 2012 show occluded LAD and RCA with patent grafts, 60% ostial OM and diffuse stenoses distal LCX. He has normal left ventricular systolic function with an EF of 55-60% by the echo performed on 06/25/2013   He received a pacemaker in 2008 for CHB, but now has sinus node arrest with intact AV conduction. He had a generator changeout and ventricular lead  revision in January 2014. He does not tolerate VVI pacing. He has had occasional nonsustained VT recorded by the device, always asymptomatic. He has had paroxysmal atrial fibrillation in the remote past. No history of stroke or TIA. Warfarin was stopped for recurrent GI bleeding requiring transfusions. Despite stopping warfarin anticoagulation, he had another episode of GI bleeding requiring transfusion in April 2015.  Although he had left atrial appendage clipping at the time of his bypass surgery, he has a permeable lobe of the left atrial appendage.  He cannot undergo a Watchman procedure.   He wears CPAP for OSA, has insulin requiring type II DM, mild dyslipidemia intolerant to all statins, post-polio syndrome, lumbar spine surgery 01/2012.   He had poorly tolerated recurrent persistent atrial fibrillation in late 2020 and underwent cardioversion followed by treatment with amiodarone.  He has not had any meaningful recurrent atrial fibrillation since. He does not trust nuclear stress tests - reports they were repeatedly wrong in the past. [name] told him he should always have a cath rather than a stress test.      Past Medical History:  Diagnosis Date   Anticoagulant long-term use    Arthritis    Arthropathy of left shoulder  Atypical mole    Biceps tendonitis, left    Bursitis of left shoulder    Cardiac pacemaker in situ    first insertion 2008;  genertor and new lead change 03-08-2012;   Medtronic   CHB (complete heart block) ([place])    s/p  PPM 2008   Chronic back pain    CKD (chronic kidney disease), stage III [facility])    Coronary artery disease cardiologist--- dr Mete Purdum   hx  CABG x2  1990 and re-do 2004;  multiple PCI to [place] to 2004;    cardiac cath 2012  occluded LAD and RCA with patent grafts   Difficult intubation    " Dr. Sallyanne Kuster said that it was difficulty to get the tube in."   History of coronary angioplasty    multiple PCI to RCA ,  1990 to 2004   History of GI  bleed followed by GI-- Olevia Perches PA @ [facility] in [name]   01/ 2020  upper and lower GI bleed ;  s/p  EGD with cautery of jejunal bleed and blood transfusion's   History of kidney stones    Hx of echocardiogram 06/29/2008   EF 45-50%    Hypertension    Ischemic cardiomyopathy    previously reported , ef 40-45%;  2010--- ef 45-50%;   last echo 2017 55-60%   Mixed hyperlipidemia    Nocturia    OSA on CPAP    cpap, 12, last sleep study Jan2013, sees Dr. Keturah Barre   PAF (paroxysmal atrial fibrillation) ([place])    long hx PAF--- followed by dr Mishika Flippen   Post-polio syndrome    dx polio age 54;   09-30-2018  per pt occasional gait issues and occasion fall   Presence of permanent cardiac pacemaker    Pseudoarthrosis of lumbar spine    Rotator cuff tear, left    S/P CABG x 2    1990 x2  and 2004  x2   Sinus node dysfunction ([place])    2015--- sinus node arrest  with intact AV conduction   Squamous cell carcinoma of skin    Type 2 diabetes mellitus ([place])    followed by pcp    Past Surgical History:  Procedure Laterality Date   APPENDECTOMY  child   APPLICATION OF INTRAOPERATIVE CT SCAN N/A 07/20/2020   Procedure: APPLICATION OF INTRAOPERATIVE CT SCAN;  Surgeon: [name], Theodoro Doing, DO;  Location: [place];  Service: Neurosurgery;  Laterality: N/A;   BLEPHAROPLASTY Bilateral 12/2014   upper eyelid   CARDIAC PACEMAKER PLACEMENT  2008   CARDIOVERSION N/A 01/15/2019   Procedure: CARDIOVERSION;  Surgeon: Sanda Klein, MD;  Location: [place] ENDOSCOPY;  Service: Cardiovascular;  Laterality: N/A;   CARPAL TUNNEL RELEASE Bilateral 2015;  2016   CATARACT EXTRACTION W/ INTRAOCULAR LENS  IMPLANT, BILATERAL  2017   CORONARY ANGIOPLASTY  1990  to 2004   multiple to [place]    @ [place] (dr chitwood)   seq. LIMA to LAD and Diagonal   CORONARY ARTERY BYPASS GRAFT  2004     dr Amador Cunas   SVG to  RCA and PDA;  ALSO MAZE  PROCEDURE    ESOPHAGOGASTRODUODENOSCOPY Left 03/18/2013   Procedure: ESOPHAGOGASTRODUODENOSCOPY (EGD);  Surgeon: Cleotis Nipper, MD;  Location: [facility] ENDOSCOPY;  Service: Endoscopy;  Laterality: Left;   HARDWARE REMOVAL N/A 05/18/2016   Procedure: Removal of broken hardware Lumbar three-four;  Surgeon: [name], MD;  Location:  [place] OR;  Service: Neurosurgery;  Laterality: N/A;   HERNIA REPAIR     LEFT HEART CATH AND CORS/GRAFTS ANGIOGRAPHY N/A 12/18/2018   Procedure: LEFT HEART CATH AND CORS/GRAFTS ANGIOGRAPHY;  Surgeon: Wellington Hampshire, MD;  Location: [place] CV LAB;  Service: Cardiovascular;  Laterality: N/A;   LEFT HEART CATHETERIZATION WITH CORONARY/GRAFT ANGIOGRAM N/A 12/21/2010   Procedure: LEFT HEART CATHETERIZATION WITH Beatrix Fetters;  Surgeon: Leonie Man, MD;  Location: [facility] C[facility] LAB;  Service: Cardiovascular;  Laterality: N/A;   LEG SURGERY Right 2002  approx.   lengthened his leg   LUMBAR LAMINECTOMY/DECOMPRESSION MICRODISCECTOMY  01/10/2012   Procedure: LUMBAR LAMINECTOMY/DECOMPRESSION MICRODISCECTOMY 1 LEVEL;  Surgeon: [name], MD;  Location: [place] NEURO ORS;  Service: Neurosurgery;  Laterality: Bilateral;  Lumbar three-four decompression, Posterior lateral fusion lumbar three-four, posterior spinus plate lumbar three-four   LUMBAR LAMINECTOMY/DECOMPRESSION MICRODISCECTOMY Left 05/18/2016   Procedure: Laminectomy and Foraminotomy - Lumbar two-lumbar three- Lumbar four-lumbar five- left with removal hardware lumbar three-four;  Surgeon: [name], MD;  Location: [place];  Service: Neurosurgery;  Laterality: Left;   NASAL SEPTUM SURGERY  yrs ago   PACEMAKER REVISION N/A 03/07/2012   Procedure: PACEMAKER REVISION;  Surgeon: Sanda Klein, MD;  Location: [place] CATH LAB;  Service: Cardiovascular;  Laterality: N/A;   POSTERIOR LUMBAR FUSION  02-22-2017   dr Ronnald Ramp  @ [facility]   re-do laminectomy L2-3 and fusion   POSTERIOR LUMBAR FUSION  03-11-2018   dr Ronnald Ramp @ [facility]   laminectomy L4-5;   fixation L2-S1 and fusion L3-S1   SACROILIAC JOINT FUSION N/A 07/20/2020   Procedure: Sacroiliac Joint Fusion;  Surgeon: Karsten Ro, DO;  Location: [place];  Service: Neurosurgery;  Laterality: N/A;   SHOULDER ARTHROSCOPY Right after 2016, pt unsure year   SHOULDER ARTHROSCOPY WITH OPEN ROTATOR CUFF REPAIR Right 03/30/2014   Procedure: RIGHT SHOULDER ARTHROSCOPY  OPEN ROTATOR CUFF REPAIR;  Surgeon: Kerin Salen, MD;  Location: [place];  Service: Orthopedics;  Laterality: Right;   SHOULDER ARTHROSCOPY WITH SUBACROMIAL DECOMPRESSION, ROTATOR CUFF REPAIR AND BICEP TENDON REPAIR Left 10/01/2018   Procedure: LEFT SHOULDER ARTHROSCOPY, DISTAL CLAVICLE EXCISION,SUBACROMIAL, DECOMPRESSION, RORATOR CUFF REPAIR, BICEPS TENODESIS;  Surgeon: Renette Butters, MD;  Location: [place];  Service: Orthopedics;  Laterality: Left;   SHOULDER OPEN ROTATOR CUFF REPAIR Right 03/30/2014   Procedure: ROTATOR CUFF REPAIR SHOULDER OPEN;  Surgeon: Kerin Salen, MD;  Location: [place];  Service: Orthopedics;  Laterality: Right;   TEE WITHOUT CARDIOVERSION N/A 01/15/2019   Procedure: TRANSESOPHAGEAL ECHOCARDIOGRAM (TEE);  Surgeon: Sanda Klein, MD;  Location: [place];  Service: Cardiovascular;  Laterality: N/A;   TONSILLECTOMY  child   TOTAL KNEE ARTHROPLASTY Left 2006    Current Medications: Outpatient Medications Prior to Visit  Medication Sig Dispense Refill   carvedilol (COREG) 25 MG tablet Take 1 tablet (25 mg total) by mouth 2 (two) times daily with a meal. 120 tablet 3   empagliflozin (JARDIANCE) 10 MG TABS tablet Take 10 mg by mouth daily.     ferrous sulfate 325 (65 FE) MG tablet Take 1 tablet (325 mg total) by mouth daily with breakfast. 30 tablet 0   glipiZIDE (GLUCOTROL) 10 MG tablet Take 10 mg by mouth 2 (two) times daily.     losartan (COZAAR) 100 MG tablet Take 100 mg by mouth daily.      Multiple Vitamin (MULTIVITAMIN WITH MINERALS) TABS tablet Take 1 tablet by mouth daily.  ONETOUCH ULTRA test strip USE 1 STRIP TO CHECK GLUCOSE 4 TIMES DAILY     pantoprazole (PROTONIX) 40 MG tablet Take 1 tablet (40 mg total) by mouth daily. 30 tablet 0   torsemide (DEMADEX) 20 MG tablet Take 2 tabs Daily every other day ALTERNATING with 1 tab daily every other day 60 tablet 1   amiodarone (PACERONE) 200 MG tablet Take 1 tablet (200 mg total) by mouth daily. Keep upcoming appointment 30 tablet 0   acetaminophen (TYLENOL) 500 MG tablet Take 1,000 mg by mouth every 6 (six) hours as needed for mild pain or headache. (Patient not taking: Reported on 03/14/2021)     Fluorouracil (TOLAK) 4 % CREA Apply to affected area nightly x 2 weeks (Patient not taking: Reported on 03/14/2021) 40 g 0   insulin lispro (HUMALOG) 100 UNIT/ML injection Inject 2-8 Units into the skin as needed for high blood sugar.  (Patient not taking: Reported on 03/14/2021)     No facility-administered medications prior to visit.     Allergies:   Patient has no known allergies.   Social History   Socioeconomic History   Marital status: Single    Spouse name: Not on file   Number of children: 1   Years of education: 16   Highest education level: Associate degree: academic program  Occupational History   Occupation: pilot-retired  Tobacco Use   Smoking status: Never   Smokeless tobacco: Never  Vaping Use   Vaping Use: Never used  Substance and Sexual Activity   Alcohol use: Not Currently   Drug use: Never   Sexual activity: Yes  Other Topics Concern   Not on file  Social History Narrative   Lives on [address] @ [employer], fishes.    Right-handed.   1 cup caffeine per day.   Social Determinants of Health   Financial Resource Strain: Low Risk    Difficulty of Paying Living Expenses: Not hard at all  Food Insecurity: No Food Insecurity   Worried About Charity fundraiser in the Last Year: Never true   [place] in the Last Year: Never true  Transportation Needs: No Transportation Needs   Lack  of Transportation (Medical): No   Lack of Transportation (Non-Medical): No  Physical Activity: Not on file  Stress: Not on file  Social Connections: Not on file     Family History:  The patient's family history includes Cancer in his mother; Other in his father.   ROS:   Please see the history of present illness.    ROS All other systems are reviewed and are negative.  PHYSICAL EXAM:   VS:  BP 130/62 (BP Location: Left Arm, Patient Position: Sitting, Cuff Size: Large)    Pulse 85    Ht 5\' 10"  (1.778 m)    Wt 216 lb 3.2 oz (98.1 kg)    SpO2 98%    BMI 31.02 kg/m     He weighed 208 pounds on his home scale today (8 pounds less than office scale).  He weighed 217 pounds when he saw Dr. Aundra Dubin in the HF clinic last week.   General: Alert, oriented x3, no distress, health in the subclavian pacemaker site Head: no evidence of trauma, PERRL, EOMI, no exophtalmos or lid lag, no myxedema, no xanthelasma; normal ears, nose and oropharynx Neck: normal jugular venous pulsations and no hepatojugular reflux; brisk carotid pulses without delay and no carotid bruits Chest: clear to auscultation, no signs of consolidation  by percussion or palpation, normal fremitus, symmetrical and full respiratory excursions Cardiovascular: normal position and quality of the apical impulse, regular rhythm, normal first and widely split second heart sounds, no murmurs, rubs or gallops Abdomen: no tenderness or distention, no masses by palpation, no abnormal pulsatility or arterial bruits, normal bowel sounds, no hepatosplenomegaly Extremities: no clubbing, cyanosis or edema; 2+ radial, ulnar and brachial pulses bilaterally; 2+ right femoral, posterior tibial and dorsalis pedis pulses; 2+ left femoral, posterior tibial and dorsalis pedis pulses; no subclavian or femoral bruits Neurological: grossly nonfocal Psych: Normal mood and affect     Wt Readings from Last 3 Encounters:  03/14/21 216 lb 3.2 oz (98.1 kg)   03/11/21 217 lb (98.4 kg)  02/11/21 220 lb (99.8 kg)     Studies/Labs Reviewed:   Echocardiogram 01/24/2021    1. Left ventricular ejection fraction, by estimation, is 55 to 60%. Left  ventricular ejection fraction by 3D volume is 56 %. Left ventricular  ejection fraction by 2D MOD biplane is 59.4 %. Left ventricular ejection  fraction by PLAX is 60 %. The left  ventricle has normal function. The left ventricle has no regional wall  motion abnormalities. There is mild concentric left ventricular  hypertrophy. Indeterminate diastolic filling due to E-A fusion.   2. Right ventricular systolic function is normal. The right ventricular  size is normal. There is normal pulmonary artery systolic pressure.   3. Left atrial size was moderately dilated.   4. The mitral valve is normal in structure. Trivial mitral valve  regurgitation. No evidence of mitral stenosis.   5. Tricuspid valve regurgitation is mild to moderate.   6. The aortic valve is tricuspid. Aortic valve regurgitation is not  visualized. No aortic stenosis is present.   7. The inferior vena cava is dilated in size with >50% respiratory  variability, suggesting right atrial pressure of 8 mmHg.   Tc60m-PYP amyloid scan 03/04/2021 IMPRESSION: Visual and quantitative assessment (grade 1, H/CL equal 0.8) are NOT suggestive of transthyretin amyloidosis.   EKG:  EKG is not ordered today.  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 and long AV delay  Recent Labs: 02/03/2021: Magnesium 2.0 02/04/2021: Hemoglobin 11.0; Platelets 85 03/11/2021: ALT 34; B Natriuretic Peptide 102.0; BUN 42; Creatinine, Ser 2.21; Potassium 4.7; Sodium 134; TSH 1.019   Lipid Panel     Component Value Date/Time   CHOL 134 12/20/2010 0903   TRIG 104 12/20/2010 0903   HDL 38 (L) 12/20/2010 0903   CHOLHDL 3.5 12/20/2010  0903   VLDL 21 12/20/2010 0903   LDLCALC 75 12/20/2010 0903   02/11/2021 hemoglobin A1c 6.1%, creatinine 2.33 (GFR 28), potassium 4.3, normal liver function tests 01/26/2021 total cholesterol 150, triglycerides 102, HDL 47, LDL 84 12/15/2020 total cholesterol 121, triglycerides 81, HDL 44, LDL 61  ASSESSMENT:    1. Chronic diastolic heart failure, NYHA class 2 ([place])   2. Coronary artery disease of bypass graft of native heart with stable angina pectoris (HCC)   3. Persistent atrial fibrillation ([place])   4. SSS (sick sinus syndrome) ([place])   5. CHB (complete heart block) (HCC)   6. NSVT (nonsustained ventricular tachycardia)   7. Pacemaker - Medtronic Adapta dual chamber   8. Essential hypertension   9. Controlled type 2 diabetes mellitus with stage 4 chronic kidney disease, with long-term current use of insulin ([place])   10. Hypercholesterolemia  11. Statin myopathy   12. CKD (chronic kidney disease) stage 4, GFR 15-29 ml/min (HCC)       PLAN:  In order of problems listed above:  CHF: Preserved left ventricular systolic function, echo evidence of diastolic dysfunction.  Decompensation occurred when diuretics were stopped due to worsening renal function.  He now appears to be euvolemic.  He weighs 208 pounds on his home scale than 216 in our office.  BNP performed just a few days ago was 102.  Renal function is stable at a new, worsened level.  Continue current diuretic dose.  He has a follow-up visit with Dr. Benjamine Mola in the heart failure clinic on March 2, we will see him after that.   CAD s/p redo CABG with exertional angina: No recent problems with angina pectoris, even during heart failure exacerbation.  He had angina in late 2020 while in atrial fibrillation when he also had a very frequent ventricular pacing.  Coronary angiography showed stable anatomy of native and bypass vessels in November 2020.  All the major myocardial territories have good arterial supply via native vessels or  grafts, with the exception of a possible small area of ischemic myocardium in the posterior wall.  Once the atrial fibrillation was corrected, angina has not bothered him. AFib: He has a remote history of endocardial ablation.  Started amiodarone after an episode of symptomatic persistent atrial fibrillation in December 2020 and has not had any meaningful recurrence since then.  CHADSVasc score 5 (age, vascular disease, HTN, DM).  He has had problems with GI bleeding while on anticoagulation.  Partially occluded left atrial appendage via clipping at the time of his bypass surgery in the past.  Cannot receive a Watchman device.  Currently not on anticoagulants.  We will use his pacemaker to monitor for arrhythmia recurrence. Sinus node arrest/CHB: He is "atrially pacemaker dependent" with 100% atrial pacing and no escape rhythm from the AV node or ventricle.  During atrial pacing he has equivalent of a "trifascicular block" with long AV delay, RBBB, LAFB.  Thankfully he has a low burden of ventricular pacing, even after amiodarone was started. NSVT: None seen on recent pacemaker downloads..  Episodes are infrequent, a few times a year, consistently asymptomatic. Pacemaker: Normal device function.  Atrially pacemaker dependent.  Heart rate histogram distribution is appropriate for his level of activity on the current sensor settings, no evidence of chronotropic incompetence.  Remote downloads every 3 months.  Using the device to monitor for atrial fibrillation recurrence. HTN: Controlled DM: Excellent diabetes control.  On Jardiance for heart failure and diabetes.  Also takes glipizide.  If he begins to have problems with hypoglycemia, will stop the glipizide first. HLP: He has statin myopathy and has been intolerant to every single available statin as well as Zetia.  He has declined treatment with PCSK9 inhibitors and Leqvio due to cost.  Last November his LDL was excellent at 60, later same year was a little  high at 102. CKD 4: Creatinine has worsened and new baseline seems to be around 2.2 GFR around 25.  He is on SGLT2 inhibitor and losartan.  Needs an appointment at [facility]. Obesity: BMI 31.  Mildly obese.  Sacroiliac joint problems limited activity.    Medication Adjustments/Labs and Tests Ordered: Current medicines are reviewed at length with the patient today.  Concerns regarding medicines are outlined above.  Medication changes, Labs and Tests ordered today are listed in the Patient Instructions below. Patient Instructions  Medication Instructions:  No changes *If you need a refill on your cardiac medications before your next appointment, please call your pharmacy*   Lab Work: None ordered If you have labs (blood work) drawn today and your tests are completely normal, you will receive your results only by: [place] (if you have MyChart) OR A paper copy in the mail If you have any lab test that is abnormal or we need to change your treatment, we will call you to review the results.   Testing/Procedures: None ordered   Follow-Up: At [facility], you and your health needs are our priority.  As part of our continuing mission to provide you with exceptional heart care, we have created designated Provider Care Teams.  These Care Teams include your primary Cardiologist (physician) and Advanced Practice Providers (APPs -  Physician Assistants and Nurse Practitioners) who all work together to provide you with the care you need, when you need it.  We recommend signing up for the patient portal called "MyChart".  Sign up information is provided on this After Visit Summary.  MyChart is used to connect with patients for Virtual Visits (Telemedicine).  Patients are able to view lab/test results, encounter notes, upcoming appointments, etc.  Non-urgent messages can be sent to your provider as well.   To learn more about what you can do with MyChart, go to [email or web address].     Your next appointment:   Follow up with Dr. Sallyanne Kuster in April on a device day    Signed, Sanda Klein, MD  03/16/2021 12:58 PM    [address], [place], [place]  [address] Phone: [phone]; Fax: [phone]

## 2021-03-14 NOTE — Patient Instructions (Signed)
Medication Instructions:  No changes *If you need a refill on your cardiac medications before your next appointment, please call your pharmacy*   Lab Work: None ordered If you have labs (blood work) drawn today and your tests are completely normal, you will receive your results only by: [place] (if you have MyChart) OR A paper copy in the mail If you have any lab test that is abnormal or we need to change your treatment, we will call you to review the results.   Testing/Procedures: None ordered   Follow-Up: At [facility], you and your health needs are our priority.  As part of our continuing mission to provide you with exceptional heart care, we have created designated Provider Care Teams.  These Care Teams include your primary Cardiologist (physician) and Advanced Practice Providers (APPs -  Physician Assistants and Nurse Practitioners) who all work together to provide you with the care you need, when you need it.  We recommend signing up for the patient portal called "MyChart".  Sign up information is provided on this After Visit Summary.  MyChart is used to connect with patients for Virtual Visits (Telemedicine).  Patients are able to view lab/test results, encounter notes, upcoming appointments, etc.  Non-urgent messages can be sent to your provider as well.   To learn more about what you can do with MyChart, go to [email or web address].    Your next appointment:   Follow up with Dr. Sallyanne Kuster in April on a device day

## 2021-03-15 ENCOUNTER — Telehealth: Payer: Self-pay | Admitting: Hematology

## 2021-03-15 ENCOUNTER — Other Ambulatory Visit: Payer: Self-pay | Admitting: Cardiovascular Disease

## 2021-03-15 NOTE — Telephone Encounter (Signed)
Scheduled appt per 2/3 referral. Spoke to pt who is aware of appt date and time. Pt is aware to arrive 15 mins prior to appt time.

## 2021-03-16 ENCOUNTER — Encounter: Payer: Self-pay | Admitting: Cardiovascular Disease

## 2021-03-16 DIAGNOSIS — I5032 Chronic diastolic (congestive) heart failure: Secondary | ICD-10-CM | POA: Insufficient documentation

## 2021-04-04 ENCOUNTER — Other Ambulatory Visit: Payer: Self-pay

## 2021-04-04 ENCOUNTER — Inpatient Hospital Stay: Payer: Medicare HMO | Attending: Hematology | Admitting: Hematology

## 2021-04-04 ENCOUNTER — Inpatient Hospital Stay: Payer: Medicare HMO

## 2021-04-04 VITALS — BP 131/62 | HR 76 | Temp 98.2°F | Resp 18 | Ht 70.0 in | Wt 215.7 lb

## 2021-04-04 DIAGNOSIS — D472 Monoclonal gammopathy: Secondary | ICD-10-CM | POA: Diagnosis present

## 2021-04-04 DIAGNOSIS — E1122 Type 2 diabetes mellitus with diabetic chronic kidney disease: Secondary | ICD-10-CM | POA: Insufficient documentation

## 2021-04-04 DIAGNOSIS — D631 Anemia in chronic kidney disease: Secondary | ICD-10-CM | POA: Insufficient documentation

## 2021-04-04 DIAGNOSIS — I130 Hypertensive heart and chronic kidney disease with heart failure and stage 1 through stage 4 chronic kidney disease, or unspecified chronic kidney disease: Secondary | ICD-10-CM | POA: Insufficient documentation

## 2021-04-04 DIAGNOSIS — I509 Heart failure, unspecified: Secondary | ICD-10-CM | POA: Diagnosis not present

## 2021-04-04 DIAGNOSIS — I255 Ischemic cardiomyopathy: Secondary | ICD-10-CM | POA: Diagnosis not present

## 2021-04-04 LAB — CMP (CANCER CENTER ONLY)
ALT: 24 U/L (ref 0–44)
AST: 19 U/L (ref 15–41)
Albumin: 3.9 g/dL (ref 3.5–5.0)
Alkaline Phosphatase: 82 U/L (ref 38–126)
Anion gap: 7 (ref 5–15)
BUN: 55 mg/dL — ABNORMAL HIGH (ref 8–23)
CO2: 26 mmol/L (ref 22–32)
Calcium: 9.6 mg/dL (ref 8.9–10.3)
Chloride: 104 mmol/L (ref 98–111)
Creatinine: 2.55 mg/dL — ABNORMAL HIGH (ref 0.61–1.24)
GFR, Estimated: 25 mL/min — ABNORMAL LOW (ref >60)
Glucose, Bld: 139 mg/dL — ABNORMAL HIGH (ref 70–99)
Potassium: 4.8 mmol/L (ref 3.5–5.1)
Sodium: 137 mmol/L (ref 135–145)
Total Bilirubin: 0.5 mg/dL (ref 0.3–1.2)
Total Protein: 8.1 g/dL (ref 6.5–8.1)

## 2021-04-04 LAB — CBC WITH DIFFERENTIAL/PLATELET
Abs Immature Granulocytes: 0.02 10*3/uL (ref 0.00–0.07)
Basophils Absolute: 0 10*3/uL (ref 0.0–0.1)
Basophils Relative: 1 %
Eosinophils Absolute: 0.1 10*3/uL (ref 0.0–0.5)
Eosinophils Relative: 2 %
HCT: 35.3 % — ABNORMAL LOW (ref 39.0–52.0)
Hemoglobin: 11.7 g/dL — ABNORMAL LOW (ref 13.0–17.0)
Immature Granulocytes: 0 %
Lymphocytes Relative: 17 %
Lymphs Abs: 0.9 10*3/uL (ref 0.7–4.0)
MCH: 32.1 pg (ref 26.0–34.0)
MCHC: 33.1 g/dL (ref 30.0–36.0)
MCV: 96.7 fL (ref 80.0–100.0)
Monocytes Absolute: 0.6 10*3/uL (ref 0.1–1.0)
Monocytes Relative: 11 %
Neutro Abs: 3.9 10*3/uL (ref 1.7–7.7)
Neutrophils Relative %: 69 %
Platelets: 102 10*3/uL — ABNORMAL LOW (ref 150–400)
RBC: 3.65 MIL/uL — ABNORMAL LOW (ref 4.22–5.81)
RDW: 12.4 % (ref 11.5–15.5)
WBC: 5.6 10*3/uL (ref 4.0–10.5)
nRBC: 0 % (ref 0.0–0.2)

## 2021-04-04 LAB — SEDIMENTATION RATE: Sed Rate: 37 mm/hr — ABNORMAL HIGH (ref 0–16)

## 2021-04-04 LAB — LACTATE DEHYDROGENASE: LDH: 121 U/L (ref 98–192)

## 2021-04-04 NOTE — Addendum Note (Signed)
Encounter addended by: Doreene Eland, RT on: 04/04/2021 3:07 PM  Actions taken: Imaging Exam ended, Charge Capture section accepted

## 2021-04-04 NOTE — Patient Instructions (Signed)
Thank you for choosing [facility] to provide your care.   Should you have questions after your visit to the [facility] ([facility]), please contact this office at [phone] between 8:30 AM and 4:30 PM.  Voice mails left after 4:00 PM may not be returned until the following business day.  Calls received after 4:30 PM will be answered by an off-site Nurse Triage Line.    Prescription Refills:  Please have your pharmacy contact us directly for most prescription requests.  Contact the office directly for refills of narcotics (pain medications). Allow 48-72 hours for refills.  Appointments: Please contact the [facility] scheduling department [phone] for questions regarding [facility] appointment scheduling.  Contact the schedulers with any scheduling changes so that your appointment can be rescheduled in a timely manner.   Central Scheduling for [place] [phone] - Call to schedule procedures such as PET scans, CT scans, MRI, Ultrasound, etc.  To afford each patient quality time with our providers, please arrive 30 minutes before your scheduled appointment time.  If you arrive late for your appointment, you may be asked to reschedule.  We strive to give you quality time with our providers, and arriving late affects you and other patients whose appointments are after yours. If you are a no show for multiple scheduled visits, you may be dismissed from the clinic at the providers discretion.     Resources: [facility] Social Workers 336-832-0950 for additional information on assistance programs or assistance connecting with community support programs   [place] DSS  336-641-3447: Information regarding food stamps, Medicaid, and utility assistance [place] [employer] [place] 336-333-6589   [place] [employer]'s shared-ride transportation service for eligible riders who have a disability that prevents them from riding the fixed route bus.   Medicare Rights Center 800-333-4114  Helps people with Medicare understand their rights and benefits, navigate the Medicare system, and secure the quality healthcare they deserve [employer] 800-227-2345 Assists patients locate various types of support and financial assistance Cancer Care: 1-800-813-HOPE (4673) Provides financial assistance, online support groups, medication/co-pay assistance.   Transportation Assistance for appointments at [facility]: Transportation Coordinator [phone]  Again, thank you for choosing [facility] for your care.       

## 2021-04-04 NOTE — Progress Notes (Addendum)
[name]   HEMATOLOGY/ONCOLOGY CONSULTATION NOTE  Date of Service: 04/04/2021  Patient Care Team: [name], MD as PCP - General (Family Medicine) [name], Dani Gobble, MD as PCP - Cardiology (Cardiology) [name], Dani Gobble, MD as Consulting Physician (Cardiology) Lavonna Monarch, MD as Consulting Physician (Dermatology) Warren Danes, PA-C as Physician Assistant (Dermatology)  CHIEF COMPLAINTS/PURPOSE OF CONSULTATION:  Cardiomyopathy r/o plasma cell dyscrasia causing amyloidosis  HISTORY OF PRESENTING ILLNESS:   Alan [name] is a wonderful 81 y.o. male who has been referred to [place] by Dr  Loralie Champagne for evaluation and management of monoclonal paraproteinemia.  Patient has a history of multiple medical comorbidities including coronary artery disease status post CABG, ischemic cardiomyopathy, obstructive sleep apnea on CPAP, postpolio lower extremity weakness, chronic kidney disease, hypertension, diabetes, atrial fibrillation on carvedilol and amiodarone and cutaneous squamous cell carcinomas.  She was admitted to the hospital on 02/02/2021 for congestive heart failure that was thought to be likely related to decrease in the dose of his outpatient diuretics in the context of worsening renal function.  Patient had had increasing LE edema and a 20 pound weight gain on admission.  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 Kappa lambda free light chains were not significantly elevated.  With kappa free light chain of 18.9 and lambda free light chain of 3.9 mg/L Immunofixation of the urine shows a faint band of monoclonal protein.  Patient notes that he is also being referred to nephrology for worsening renal function.  Patient notes no focal new bone pains.  MEDICAL HISTORY:  Past Medical History:  Diagnosis Date   Anticoagulant long-term use    Arthritis    Arthropathy of left shoulder    Atypical  mole    Biceps tendonitis, left    Bursitis of left shoulder    Cardiac pacemaker in situ    first insertion 2008;  genertor and new lead change 03-08-2012;   [employer]   CHB (complete heart block) ([place])    s/p  PPM 2008   Chronic back pain    CKD (chronic kidney disease), stage III [facility])    Coronary artery disease cardiologist--- dr croitoru   hx  CABG x2  1990 and re-do 2004;  multiple PCI to [place] to 2004;    cardiac cath 2012  occluded LAD and RCA with patent grafts   Difficult intubation    " Dr. Sallyanne Kuster said that it was difficulty to get the tube in."   History of coronary angioplasty    multiple PCI to RCA ,  1990 to 2004   History of GI bleed followed by GI-- Olevia Perches [facility] [place]   01/ 2020  upper and lower GI bleed ;  s/p  EGD with cautery of jejunal bleed and blood transfusion's   History of kidney stones    Hx of echocardiogram 06/29/2008   EF 45-50%    Hypertension    Ischemic cardiomyopathy    previously reported , ef 40-45%;  2010--- ef 45-50%;   last echo 2017 55-60%   Mixed hyperlipidemia    Nocturia    OSA on CPAP    cpap, 12, last sleep study Jan2013, sees Dr. Keturah Barre   PAF (paroxysmal atrial fibrillation) ([place])    long hx PAF--- followed by dr croitoru   Post-polio syndrome    dx polio age 71;   09-30-2018  per pt  occasional gait issues and occasion fall   Presence of permanent cardiac pacemaker    Pseudoarthrosis of lumbar spine    Rotator cuff tear, left    S/P CABG x 2    1990 x2  and 2004  x2   Sinus node dysfunction ([place])    2015--- sinus node arrest  with intact AV conduction   Squamous cell carcinoma of skin    Type 2 diabetes mellitus ([place])    followed by pcp    SURGICAL HISTORY: Past Surgical History:  Procedure Laterality Date   APPENDECTOMY  child   APPLICATION OF INTRAOPERATIVE CT SCAN N/A 07/20/2020   Procedure: APPLICATION OF INTRAOPERATIVE CT SCAN;  Surgeon: [name], Theodoro Doing, DO;  Location: [place];   Service: Neurosurgery;  Laterality: N/A;   BLEPHAROPLASTY Bilateral 12/2014   upper eyelid   CARDIAC PACEMAKER PLACEMENT  2008   CARDIOVERSION N/A 01/15/2019   Procedure: CARDIOVERSION;  Surgeon: Sanda Klein, MD;  Location: [facility] ENDOSCOPY;  Service: Cardiovascular;  Laterality: N/A;   CARPAL TUNNEL RELEASE Bilateral 2015;  2016   CATARACT EXTRACTION W/ INTRAOCULAR LENS  IMPLANT, BILATERAL  2017   CORONARY ANGIOPLASTY  1990  to 2004   multiple to [place]    @ [place] (dr chitwood)   seq. LIMA to LAD and Diagonal   CORONARY ARTERY BYPASS GRAFT  2004     dr Amador Cunas   SVG to  RCA and PDA;  ALSO MAZE  PROCEDURE   ESOPHAGOGASTRODUODENOSCOPY Left 03/18/2013   Procedure: ESOPHAGOGASTRODUODENOSCOPY (EGD);  Surgeon: Cleotis Nipper, MD;  Location: [facility] ENDOSCOPY;  Service: Endoscopy;  Laterality: Left;   HARDWARE REMOVAL N/A 05/18/2016   Procedure: Removal of broken hardware Lumbar three-four;  Surgeon: [name], MD;  Location: [place];  Service: Neurosurgery;  Laterality: N/A;   HERNIA REPAIR     LEFT HEART CATH AND CORS/GRAFTS ANGIOGRAPHY N/A 12/18/2018   Procedure: LEFT HEART CATH AND CORS/GRAFTS ANGIOGRAPHY;  Surgeon: Wellington Hampshire, MD;  Location: [place] CV LAB;  Service: Cardiovascular;  Laterality: N/A;   LEFT HEART CATHETERIZATION WITH CORONARY/GRAFT ANGIOGRAM N/A 12/21/2010   Procedure: LEFT HEART CATHETERIZATION WITH Beatrix Fetters;  Surgeon: Leonie Man, MD;  Location: [facility] CATH LAB;  Service: Cardiovascular;  Laterality: N/A;   LEG SURGERY Right 2002  approx.   lengthened his leg   LUMBAR LAMINECTOMY/DECOMPRESSION MICRODISCECTOMY  01/10/2012   Procedure: LUMBAR LAMINECTOMY/DECOMPRESSION MICRODISCECTOMY 1 LEVEL;  Surgeon: [name], MD;  Location: [facility] NEURO ORS;  Service: Neurosurgery;  Laterality: Bilateral;  Lumbar three-four decompression, Posterior lateral fusion lumbar three-four, posterior spinus plate lumbar three-four    LUMBAR LAMINECTOMY/DECOMPRESSION MICRODISCECTOMY Left 05/18/2016   Procedure: Laminectomy and Foraminotomy - Lumbar two-lumbar three- Lumbar four-lumbar five- left with removal hardware lumbar three-four;  Surgeon: [name], MD;  Location: [place];  Service: Neurosurgery;  Laterality: Left;   NASAL SEPTUM SURGERY  yrs ago   PACEMAKER REVISION N/A 03/07/2012   Procedure: PACEMAKER REVISION;  Surgeon: Sanda Klein, MD;  Location: [place] CATH LAB;  Service: Cardiovascular;  Laterality: N/A;   POSTERIOR LUMBAR FUSION  02-22-2017   dr Ronnald Ramp  @ [facility]   re-do laminectomy L2-3 and fusion   POSTERIOR LUMBAR FUSION  03-11-2018   dr Ronnald Ramp @ [facility]   laminectomy L4-5;  fixation L2-S1 and fusion L3-S1   SACROILIAC JOINT FUSION N/A 07/20/2020   Procedure: Sacroiliac Joint Fusion;  Surgeon: Karsten Ro, DO;  Location: [facility];  Service: Neurosurgery;  Laterality: N/A;   SHOULDER ARTHROSCOPY Right after 2016, pt unsure year   SHOULDER ARTHROSCOPY WITH OPEN ROTATOR CUFF REPAIR Right 03/30/2014   Procedure: RIGHT SHOULDER ARTHROSCOPY  OPEN ROTATOR CUFF REPAIR;  Surgeon: Kerin Salen, MD;  Location: [place];  Service: Orthopedics;  Laterality: Right;   SHOULDER ARTHROSCOPY WITH SUBACROMIAL DECOMPRESSION, ROTATOR CUFF REPAIR AND BICEP TENDON REPAIR Left 10/01/2018   Procedure: LEFT SHOULDER ARTHROSCOPY, DISTAL CLAVICLE EXCISION,SUBACROMIAL, DECOMPRESSION, RORATOR CUFF REPAIR, BICEPS TENODESIS;  Surgeon: Renette Butters, MD;  Location: [place];  Service: Orthopedics;  Laterality: Left;   SHOULDER OPEN ROTATOR CUFF REPAIR Right 03/30/2014   Procedure: ROTATOR CUFF REPAIR SHOULDER OPEN;  Surgeon: Kerin Salen, MD;  Location: [place];  Service: Orthopedics;  Laterality: Right;   TEE WITHOUT CARDIOVERSION N/A 01/15/2019   Procedure: TRANSESOPHAGEAL ECHOCARDIOGRAM (TEE);  Surgeon: Sanda Klein, MD;  Location: [facility] ENDOSCOPY;  Service: Cardiovascular;  Laterality: N/A;   TONSILLECTOMY  child   TOTAL KNEE  ARTHROPLASTY Left 2006    SOCIAL HISTORY: Social History   Socioeconomic History   Marital status: Single    Spouse name: Not on file   Number of children: 1   Years of education: 16   Highest education level: Associate degree: academic program  Occupational History   Occupation: pilot-retired  Tobacco Use   Smoking status: Never   Smokeless tobacco: Never  Vaping Use   Vaping Use: Never used  Substance and Sexual Activity   Alcohol use: Not Currently   Drug use: Never   Sexual activity: Yes  Other Topics Concern   Not on file  Social History Narrative   Lives on [address] [employer], [place].    Right-handed.   1 cup caffeine per day.   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  Lives in [place]. Non-smoker.  Rare alcohol use.   FAMILY HISTORY: Family History  Problem Relation Age of Onset   Other Father        MVA   Cancer Mother     ALLERGIES:  has No Known Allergies.  MEDICATIONS:  Current Outpatient Medications  Medication Sig Dispense Refill   acetaminophen (TYLENOL) 500 MG tablet Take 1,000 mg by mouth every 6 (six) hours as needed for mild pain or headache.     carvedilol (COREG) 25 MG tablet Take 1 tablet (25 mg total) by mouth 2 (two) times daily with a meal. (Patient taking differently: Take 25 mg by mouth 2 (two) times daily with a meal. 2 tabs twice daily) 120 tablet 3   empagliflozin (JARDIANCE) 10 MG TABS tablet  Take 10 mg by mouth daily.     ferrous sulfate 325 (65 FE) MG tablet Take 1 tablet (325 mg total) by mouth daily with breakfast. (Patient taking differently: Take 325 mg by mouth 2 (two) times daily with a meal.) 30 tablet 0  glipiZIDE (GLUCOTROL) 10 MG tablet Take 10 mg by mouth 2 (two) times daily.     insulin lispro (HUMALOG) 100 UNIT/ML injection Inject 2-8 Units into the skin as needed for high blood sugar. Take as needed based on CBG value     losartan (COZAAR) 100 MG tablet Take 100 mg by mouth daily.      metFORMIN (GLUCOPHAGE) 500 MG tablet Take 500 mg by mouth 2 (two) times daily with a meal.     metFORMIN (GLUCOPHAGE-XR) 500 MG 24 hr tablet Take 500 mg by mouth 2 (two) times daily.     Multiple Vitamin (MULTIVITAMIN WITH MINERALS) TABS tablet Take 1 tablet by mouth daily.      pantoprazole (PROTONIX) 40 MG tablet Take 1 tablet (40 mg total) by mouth daily. 30 tablet 0   torsemide (DEMADEX) 20 MG tablet Take 2 tabs Daily every other day ALTERNATING with 1 tab daily every other day 60 tablet 1   amiodarone (PACERONE) 200 MG tablet TAKE 2 TABLETS BY MOUTH DAILY FOR 14 DAYS, THEN TAKE ONE TABLET ONCE DAILY (Patient not taking: Reported on 04/04/2021) 45 tablet 0   Fluorouracil (TOLAK) 4 % CREA Apply to affected area nightly x 2 weeks (Patient not taking: Reported on 03/14/2021) 40 g 0   ONETOUCH ULTRA test strip USE 1 STRIP TO CHECK GLUCOSE 4 TIMES DAILY     No current facility-administered medications for this visit.    REVIEW OF SYSTEMS:   10 Point review of Systems was done is negative except as noted above.  PHYSICAL EXAMINATION: ECOG PERFORMANCE STATUS: 2 - Symptomatic, <50% confined to bed  . Vitals:   04/04/21 1113  BP: 131/62  Pulse: 76  Resp: 18  Temp: 98.2 F (36.8 C)  SpO2: 100%   Filed Weights   04/04/21 1113  Weight: 215 lb 11.2 oz (97.8 kg)   .Body mass index is 30.95 kg/m. NAD GENERAL:alert, in no acute distress and comfortable SKIN: no acute rashes, no  significant lesions EYES: conjunctiva are pink and non-injected, sclera anicteric OROPHARYNX: MMM, no exudates, no oropharyngeal erythema or ulceration NECK: supple, no JVD LYMPH:  no palpable lymphadenopathy in the cervical, axillary or inguinal regions LUNGS: clear to auscultation b/l with normal respiratory effort HEART: regular rate & rhythm ABDOMEN:  normoactive bowel sounds , non tender, not distended. Extremity: no pedal edema PSYCH: alert & oriented x 3 with fluent speech NEURO: no focal motor/sensory deficits  LABORATORY DATA:  I have reviewed the data as listed  . CBC Latest Ref Rng & Units 04/04/2021 02/04/2021 02/03/2021  WBC 4.0 - 10.5 K/uL 5.6 5.4 5.4  Hemoglobin 13.0 - 17.0 g/dL 11.7(L) 11.0(L) 10.1(L)  Hematocrit 39.0 - 52.0 % 35.3(L) 32.8(L) 31.7(L)  Platelets 150 - 400 K/uL 102(L) 85(L) 80(L)    . CMP Latest Ref Rng & Units 04/04/2021 03/11/2021 02/11/2021  Glucose 70 - 99 mg/dL 139(H) 101(H) 170(H)  BUN 8 - 23 mg/dL 55(H) 42(H) 47(H)  Creatinine 0.61 - 1.24 mg/dL 2.55(H) 2.21(H) 2.37(H)  Sodium 135 - 145 mmol/L 137 134(L) 135  Potassium 3.5 - 5.1 mmol/L 4.8 4.7 4.0  Chloride 98 - 111 mmol/L 104 106 104  CO2 22 - 32 mmol/L 26 19(L) 23  Calcium 8.9 - 10.3 mg/dL 9.6 9.6 9.2  Total Protein 6.5 - 8.1 g/dL 8.1 8.3(H) -  Total Bilirubin 0.3 - 1.2 mg/dL 0.5 0.4 -  Alkaline Phos 38 - 126 U/L 82 87 -  AST 15 - 41 U/L 19 28 -  ALT 0 - 44 U/L 24 34 -     RADIOGRAPHIC STUDIES: I have personally reviewed the radiological images as listed and agreed with the findings in the report. No results found.  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 and less likely to the monoclonal paraproteinemia.  Also patient has ischemic cardiomyopathy which is likely explanation for his congestive heart failure along with decreasing diuretics and seems to be less likely related to AL amyloidosis clinically.  Plan -I discussed in detail with the patient and his accompanying family member available lab results. -We discussed the different times of plasma cell dyscrasias and the possible clinical relevance of this in the context of his heart and kidney issues as well as anemia. -He has a follow-up with nephrology to evaluate his chronic kidney disease.  We will defer to nephrology regarding need for possible kidney biopsy to differentiate the different etiologies of his chronic kidney disease. -Continue to optimize hypertension diabetes and heart issues with primary care physician and cardiology. -Cardiology is already following the patient for his ongoing heart issues which are primarily stemming from ischemic cardiomyopathy and paroxysmal A-fib. -We will get myeloma labs to evaluate this further -Outpatient PET CT scan -CT bone marrow aspiration and biopsy to evaluate for AL amyloidosis and myeloma   follow-up Labs today Pet/ct in 1 week CT bone marrow aspiration/biopsy in 1 week RTC with Dr Irene Limbo in 3 weeks   All of the patients questions were answered with apparent satisfaction. The patient knows to call the clinic with any problems, questions or concerns.  I spent 40 minutes counseling the patient face to face. The total time spent in the appointment was 60 minutes and more than 50% was on counseling and direct patient cares.    Sullivan Lone MD [place] AAHIVMS [facility] Hematology/Oncology Physician [facility]  (Office):       [phone] (Work cell):  [phone] (Fax):           [phone]  04/04/2021 11:46 AM

## 2021-04-05 ENCOUNTER — Ambulatory Visit (INDEPENDENT_AMBULATORY_CARE_PROVIDER_SITE_OTHER): Payer: Medicare HMO

## 2021-04-05 DIAGNOSIS — I495 Sick sinus syndrome: Secondary | ICD-10-CM

## 2021-04-05 LAB — KAPPA/LAMBDA LIGHT CHAINS
Kappa free light chain: 50.7 mg/L — ABNORMAL HIGH (ref 3.3–19.4)
Kappa, lambda light chain ratio: 2.03 — ABNORMAL HIGH (ref 0.26–1.65)
Lambda free light chains: 25 mg/L (ref 5.7–26.3)

## 2021-04-06 ENCOUNTER — Inpatient Hospital Stay: Payer: Medicare HMO | Attending: Hematology

## 2021-04-06 ENCOUNTER — Telehealth: Payer: Self-pay | Admitting: Hematology

## 2021-04-06 ENCOUNTER — Other Ambulatory Visit: Payer: Self-pay

## 2021-04-06 DIAGNOSIS — D472 Monoclonal gammopathy: Secondary | ICD-10-CM

## 2021-04-06 LAB — CUP PACEART REMOTE DEVICE CHECK
Battery Impedance: 938 Ohm
Battery Remaining Longevity: 62 mo
Battery Voltage: 2.77 V
Brady Statistic AP VP Percent: 22 %
Brady Statistic AP VS Percent: 78 %
Brady Statistic AS VP Percent: 0 %
Brady Statistic AS VS Percent: 0 %
Date Time Interrogation Session: 20230228123204
Implantable Lead Implant Date: 20080208
Implantable Lead Implant Date: 20140130
Implantable Lead Location: 753859
Implantable Lead Location: 753860
Implantable Lead Model: 5076
Implantable Lead Model: 5076
Implantable Pulse Generator Implant Date: 20140130
Lead Channel Impedance Value: 467 Ohm
Lead Channel Impedance Value: 561 Ohm
Lead Channel Pacing Threshold Amplitude: 0.75 V
Lead Channel Pacing Threshold Amplitude: 0.75 V
Lead Channel Pacing Threshold Pulse Width: 0.4 ms
Lead Channel Pacing Threshold Pulse Width: 0.4 ms
Lead Channel Setting Pacing Amplitude: 2 V
Lead Channel Setting Pacing Amplitude: 2.5 V
Lead Channel Setting Pacing Pulse Width: 0.4 ms
Lead Channel Setting Sensing Sensitivity: 2.8 mV

## 2021-04-06 NOTE — Telephone Encounter (Signed)
Left message with follow-up appointment per 2/27 los. ?

## 2021-04-07 ENCOUNTER — Encounter (HOSPITAL_COMMUNITY): Payer: Self-pay | Admitting: Cardiology

## 2021-04-07 ENCOUNTER — Ambulatory Visit (HOSPITAL_COMMUNITY)
Admission: RE | Admit: 2021-04-07 | Discharge: 2021-04-07 | Disposition: A | Discharge status: Home / Self Care | Payer: Medicare HMO | Source: Ambulatory Visit | Attending: Cardiology | Admitting: Cardiology

## 2021-04-07 ENCOUNTER — Other Ambulatory Visit: Payer: Self-pay

## 2021-04-07 VITALS — BP 120/70 | HR 62 | Wt 215.4 lb

## 2021-04-07 DIAGNOSIS — I480 Paroxysmal atrial fibrillation: Secondary | ICD-10-CM

## 2021-04-07 DIAGNOSIS — Z950 Presence of cardiac pacemaker: Secondary | ICD-10-CM | POA: Diagnosis not present

## 2021-04-07 DIAGNOSIS — N184 Chronic kidney disease, stage 4 (severe): Secondary | ICD-10-CM | POA: Insufficient documentation

## 2021-04-07 DIAGNOSIS — K922 Gastrointestinal hemorrhage, unspecified: Secondary | ICD-10-CM | POA: Diagnosis not present

## 2021-04-07 DIAGNOSIS — Z79899 Other long term (current) drug therapy: Secondary | ICD-10-CM | POA: Diagnosis not present

## 2021-04-07 DIAGNOSIS — I130 Hypertensive heart and chronic kidney disease with heart failure and stage 1 through stage 4 chronic kidney disease, or unspecified chronic kidney disease: Secondary | ICD-10-CM | POA: Insufficient documentation

## 2021-04-07 DIAGNOSIS — Z7984 Long term (current) use of oral hypoglycemic drugs: Secondary | ICD-10-CM | POA: Insufficient documentation

## 2021-04-07 DIAGNOSIS — Z951 Presence of aortocoronary bypass graft: Secondary | ICD-10-CM | POA: Insufficient documentation

## 2021-04-07 DIAGNOSIS — D472 Monoclonal gammopathy: Secondary | ICD-10-CM | POA: Insufficient documentation

## 2021-04-07 DIAGNOSIS — I5032 Chronic diastolic (congestive) heart failure: Secondary | ICD-10-CM | POA: Insufficient documentation

## 2021-04-07 DIAGNOSIS — I2510 Atherosclerotic heart disease of native coronary artery without angina pectoris: Secondary | ICD-10-CM | POA: Insufficient documentation

## 2021-04-07 DIAGNOSIS — I25708 Atherosclerosis of coronary artery bypass graft(s), unspecified, with other forms of angina pectoris: Secondary | ICD-10-CM

## 2021-04-07 LAB — MULTIPLE MYELOMA PANEL, SERUM
Albumin SerPl Elph-Mcnc: 3.6 g/dL (ref 2.9–4.4)
Albumin/Glob SerPl: 1 (ref 0.7–1.7)
Alpha 1: 0.2 g/dL (ref 0.0–0.4)
Alpha2 Glob SerPl Elph-Mcnc: 0.8 g/dL (ref 0.4–1.0)
B-Globulin SerPl Elph-Mcnc: 1 g/dL (ref 0.7–1.3)
Gamma Glob SerPl Elph-Mcnc: 2 g/dL — ABNORMAL HIGH (ref 0.4–1.8)
Globulin, Total: 3.9 g/dL (ref 2.2–3.9)
IgA: 134 mg/dL (ref 61–437)
IgG (Immunoglobin G), Serum: 2172 mg/dL — ABNORMAL HIGH (ref 603–1613)
IgM (Immunoglobulin M), Srm: 20 mg/dL (ref 15–143)
M Protein SerPl Elph-Mcnc: 1.5 g/dL — ABNORMAL HIGH
Total Protein ELP: 7.5 g/dL (ref 6.0–8.5)

## 2021-04-07 MED ORDER — LOSARTAN POTASSIUM 50 MG PO TABS: 50.0000 mg | ORAL_TABLET | Freq: Every day | ORAL | 6 refills | Status: DC

## 2021-04-07 MED ORDER — TORSEMIDE 20 MG PO TABS: ORAL_TABLET | ORAL | 1 refills | Status: DC

## 2021-04-07 MED ORDER — CLOPIDOGREL BISULFATE 75 MG PO TABS: 75.0000 mg | ORAL_TABLET | Freq: Every day | ORAL | 3 refills | Status: DC

## 2021-04-07 MED ORDER — CARVEDILOL 25 MG PO TABS: 50.0000 mg | ORAL_TABLET | Freq: Two times a day (BID) | ORAL | 3 refills | Status: DC

## 2021-04-07 NOTE — Progress Notes (Signed)
ReDS Vest / Clip - 04/07/21 0900   ? ?  ? ReDS Vest / Clip  ? Station Marker C   ? Ruler Value 31.5   ? ReDS Value Range Moderate volume overload   ? ReDS Actual Value 37   ? ?  ?  ? ?  ? ? ?

## 2021-04-07 NOTE — Progress Notes (Signed)
PCP: [name], MD Cardiology: Dr. Sallyanne Kuster HF Cardiology: Dr. Aundra Dubin  81 y.o. with history of complete heart block s/p pacemaker, CAD s/p CABG and redo, CKD stage 3, GI bleeding, and chronic diastolic CHF was referred to CHF clinic from the [facility] clinic. Patient has a long history of CAD. He had his initial CABG in 1990 then redo in 2004. Last cath in 11/20 showed patent sequential LIMA-LAD/D and SVG-PDA, medical management.  He has a Medtronic PPM due to complete heart block, has had this for years.  He has paroxysmal atrial fibrillation, in NSR today. He is not on anticoagulation due to GI bleeding.  He has not had suitable anatomy for Watchman due to surgical clipping with partial closure of the LA appendage.   Patient has struggled recently with diastolic CHF.  He was admitted in 12/22 with CHF exacerbation.  Echo in 12/22 showed EF 55-60%, mild LVH, normal RV, moderate LAE. With diastolic CHF, PAF, biceps tendon rupture, and bilateral carpal tunnel syndrome, cardiac amyloidosis was considered.  PYP scan was not suggestive of TTR cardiac amyloidosis. 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   At last appointment, he was volume overloaded and I increased his torsemide to 40 daily alternating with 20 mg daily. He is a little bit unclear about what dose of torsemide he is taking currently.  He thinks that his PCP changed it but do not see it reported in PCP's last note.  Creatinine increased to 2.9, now back down to 2.55 most recently.   Patient returns for followup of CHF.  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  Still gets short of breath walking up a flight of stairs.  No chest pain.  Weight is down 2 lbs. No BRBPR/melena.  No palpitations.  No lightheadedness. SBP generally  runs 120s-130 at home when he checks.   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 Labs (2/23): K 4.8, creatinine 2.9 => 2.55, LFTs normal, TSH normal  MDT device interrogation: 22.5% RV-pacing, no AF  REDS clip 37%  PMH: 1. Atrial fibrillation: Paroxysmal 2. Complete heart block: MDT PPM placed in his 50s.  3. Biceps tendon rupture 4. 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 HTN 7. Hyperlipidemia: h/o statin myopathy, muscle pain with Zetia, PCSK9 has been too expensive.  8. CKD stage 4 9. H/o GI bleeding: Not suitable Watchman candidate due to partial closure of the appendage by surgical clip.  He is not anticoagulated.  10. Chronic diastolic CHF: Echo (16/10) with EF 55-60%, mild LVH, normal RV, moderate LAE.  - PYP scan (1/23): grade 1, H/CL 0.8 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  12. CAD: CABG x 2 in 1990, redo CABG x 2 in 2004.  - LHC (11/20): occluded proximal LAD, 99% dLCx, occluded proximal RCA, LIMA-LAD/diagonal patent, SVG-PDA patent. Distal LCx was small, managed medically.  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   SH: Retired Insurance underwriter, also was a Technical brewer.  Lives in [place].  Nonsmoker.  Rare ETOH.   Family History  Problem Relation Age of Onset   Other Father  MVA   Cancer Mother    ROS: All systems reviewed and negative except as per HPI.   Current Outpatient Medications  Medication Sig Dispense Refill   acetaminophen (TYLENOL) 500 MG tablet Take 1,000 mg by mouth every 6 (six) hours as needed for mild pain or headache.     amiodarone (PACERONE) 200 MG tablet Take 200 mg by mouth daily.     clopidogrel (PLAVIX) 75 MG tablet Take 1 tablet (75 mg total) by mouth daily. 90 tablet 3   empagliflozin (JARDIANCE) 10  MG TABS tablet Take 10 mg by mouth daily.     ferrous sulfate 325 (65 FE) MG EC tablet Take 325 mg by mouth 2 (two) times daily.     glipiZIDE (GLUCOTROL) 10 MG tablet Take 10 mg by mouth 2 (two) times daily.     insulin lispro (HUMALOG) 100 UNIT/ML injection Inject 2-8 Units into the skin as needed for high blood sugar. Take as needed based on CBG value     metFORMIN (GLUCOPHAGE) 500 MG tablet Take 500 mg by mouth 2 (two) times daily with a meal.     metFORMIN (GLUCOPHAGE-XR) 500 MG 24 hr tablet Take 500 mg by mouth daily in the afternoon.     Multiple Vitamin (MULTIVITAMIN WITH MINERALS) TABS tablet Take 1 tablet by mouth daily.      ONETOUCH ULTRA test strip USE 1 STRIP TO CHECK GLUCOSE 4 TIMES DAILY     pantoprazole (PROTONIX) 40 MG tablet Take 1 tablet (40 mg total) by mouth daily. 30 tablet 0   carvedilol (COREG) 25 MG tablet Take 2 tablets (50 mg total) by mouth 2 (two) times daily with a meal. 120 tablet 3   losartan (COZAAR) 50 MG tablet Take 1 tablet (50 mg total) by mouth daily. 30 tablet 6   torsemide (DEMADEX) 20 MG tablet Take 1 tabs Daily every other day ALTERNATING with 1/2 tab daily every other day 60 tablet 1   No current facility-administered medications for this encounter.   BP 120/70    Pulse 62    Wt 97.7 kg (215 lb 6.4 oz)    SpO2 98%    BMI 30.91 kg/m  General: NAD Neck: No JVD, no thyromegaly or thyroid nodule.  Lungs: Clear to auscultation bilaterally with normal respiratory effort. CV: Nondisplaced PMI.  Heart regular S1/S2, no S3/S4, no murmur.  No peripheral edema.  No carotid bruit.  Normal pedal pulses.  Abdomen: Soft, nontender, no hepatosplenomegaly, no distention.  Skin: Intact without lesions or rashes.  Neurologic: Alert and oriented x 3.  Psych: Normal affect. Extremities: No clubbing or cyanosis.  HEENT: Normal.   Assessment/Plan: 1. Chronic diastolic CHF: Echo 74/16 showed EF 55-60%, mild LVH, normal RV, moderate LAE.  There was concern for cardiac  amyloidosis with bilateral carpal tunnel syndrome and biceps tendon rupture.  PYP scan was not suggestive of TTR cardiac amyloidosis, but myeloma panel and urine immunofixation were suggestive of monoclonal gammopathy. 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  Given lack of rapid progression, suspect he does not have AL amyloidosis.  He is unable to get cardiac MRI, PPM is not compatible.  NYHA class II symptoms.  REDS clip at 37% suggests mild volume overload though weight is down 2 lbs and he looks euvolemic on exam.  He and I are a bit unsure about his diuretic dosing,  but sounds like he is taking torsemide 20 mg daily alternating with 10 mg daily (1/2 tab).   - With recent rise in creatinine, will keep torsemide at 20 qam/10 qpm. - Continue Jardiance 10 mg daily.  2. 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  3. CAD: S/p CABG and redo.  Cath in 11/20 showed patent grafts.  He denies chest pain.  - He is not on ASA due to GI bleeding => With no recent GI bleeding, think he could take Plavix => will start at 75 mg daily.  - He is not on statin due to history of statin myopathy and inability to tolerate Zetia.  PCSK9 inhibitors were too expensive in the past and he is not interested in looking into them again.  4. HTN: BP controlled on Coreg 50 mg bid and losartan 100 mg daily.  - With recent creatinine rise, will decrease losartan to 50 mg daily.  He will check BP daily and call with readings in 2 wks.  If BP too high with drop in losartan, will add amlodipine.  5. Atrial fibrillation: Paroxysmal.  He is in NSR today.  He is not on anticoagulation due to history of GI bleeding.  - Continue amiodarone 200 daily.  Recent TSH and LFTs normal, he will need a regular eye exam.  - He is not a Watchman candidate, he has a partially closed LA appendage from surgical clip.  6. H/o complete  heart block: He has a MDT PPM, 22.5% RV pacing on interrogation today but EF was normal on last echo.  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  7. CKD: Stage 4.  Recent creatinine was 2.55.   Followup in 1 month.   Loralie Champagne 04/07/2021

## 2021-04-07 NOTE — Patient Instructions (Signed)
Than you for your visit today. ? ?PLEASE BRING YOUR MEDICATION OR MEDICATION LIST WITH YOU TO YOUR NEXT APPOINTMENT. ? ?CHECK YOUR BLOOD PRESSURE EVERY MORNING AFTER YOU HAVE TAKEN YOUR MEDICATION. KEEP A LOG AND CALL IN READING AFTER 2 WEEKS ? ?Your physician recommends that you schedule a follow-up appointment in: 1 MONTH ? ? If you have any questions or concerns before your next appointment please send [place] a message through [place] or call our office at [phone].   ? ?TO LEAVE A MESSAGE FOR THE NURSE SELECT OPTION 2, PLEASE LEAVE A MESSAGE INCLUDING: ?YOUR NAME ?DATE OF BIRTH ?CALL BACK NUMBER ?REASON FOR CALL**this is important as we prioritize the call backs ? ?YOU WILL RECEIVE A CALL BACK THE SAME DAY AS LONG AS YOU CALL BEFORE 4:00 PM ? ?At the [facility], you and your health needs are our priority. As part of our continuing mission to provide you with exceptional heart care, we have created designated Provider Care Teams. These Care Teams include your primary Cardiologist (physician) and Advanced Practice Providers (APPs- Physician Assistants and Nurse Practitioners) who all work together to provide you with the care you need, when you need it.  ? ?You may see any of the following providers on your designated Care Team at your next follow up: ?Dr Glori Bickers ?Dr Loralie Champagne ?Darrick Grinder, NP ?Lyda Jester, PA ?Jessica Milford,NP ?Marlyce Huge, PA ?Audry Riles, PharmD ? ? ?Please be sure to bring in all your medications bottles to every appointment.  ? ? ?

## 2021-04-08 LAB — UPEP/UIFE/LIGHT CHAINS/TP, 24-HR UR
% BETA, Urine: 0 %
ALPHA 1 URINE: 0 %
Albumin, U: 100 %
Alpha 2, Urine: 0 %
Free Kappa Lt Chains,Ur: 15.71 mg/L (ref 1.17–86.46)
Free Kappa/Lambda Ratio: 5.44 (ref 1.83–14.26)
Free Lambda Lt Chains,Ur: 2.89 mg/L (ref 0.27–15.21)
GAMMA GLOBULIN URINE: 0 %
Total Protein, Urine-Ur/day: <88 mg/24 hr (ref 30–150)
Total Protein, Urine: <4 mg/dL
Total Volume: 2200

## 2021-04-08 NOTE — Progress Notes (Signed)
Thanks , Cabin crew. RV pacing was up to 22% this month, but over last 6 months has been around 15-16%. It is slowly creeping up since on amiodarone, still relatively infrequent. ?

## 2021-04-11 NOTE — Addendum Note (Signed)
Addended by: Sullivan Lone on: 04/11/2021 01:18 AM   Modules accepted: Orders

## 2021-04-12 NOTE — Progress Notes (Signed)
Remote pacemaker transmission.   

## 2021-04-22 ENCOUNTER — Other Ambulatory Visit: Payer: Self-pay | Admitting: Cardiovascular Disease

## 2021-04-25 ENCOUNTER — Inpatient Hospital Stay: Payer: Medicare HMO | Admitting: Hematology

## 2021-04-25 ENCOUNTER — Ambulatory Visit (HOSPITAL_COMMUNITY): Payer: Medicare HMO

## 2021-04-29 NOTE — Progress Notes (Signed)
HPI ?M never smoker followed for OSA, history UPPP. Complicated by Post polio, AFib/ pacemaker, CAD/CABG,  DM 2, HBP, gout,  DDD,      PCP Dr Melford Aase ?NPSG 05/30/97- AHI 20/ hr, desaturation to 78%, body weight 230 lbs ?Office Spirometry-02/26/2018- Mild Restriction of exhaled volume.  FVC 2.9/69%, FEV1 2.4/81%, ratio 1.84, FEF 25-75% 3.1/145%. ?------------------------------------------------------------------------------ ? ? ?03/10/20- 79 YOM never smoker followed for OSA, history UPPP. Complicated by Post polio, AFib/ pacemaker/ Coumadin, CAD/CABG/ Pacemaker,  DM 2, HBP, gout, PCP Dr Melford Aase, Covid infection 02/11/20,  ?CPAP 12/ [facility]- ?Uses older machine mostly- ?Download- Newer machine- brought with him today-no card      AutoSet AirSense 10 ?Body weight today-228 lbs ?Covid vax-3 Phizer ?Flu vax-had ?He asks to change DME from [facility]. Was very compliant with his older machine. No download available yet with new machine.  ?Runs out of breath with exertion. No abrupt change. Little cough or wheeze.  ?CXR 03/13/19-  ?FINDINGS: ?Frontal and lateral views of the chest demonstrates stable ?enlargement of the cardiac silhouette. Multi lead pacemaker again ?noted and unchanged. No airspace disease, effusion, or pneumothorax. ?Lumbar fusion hardware is identified. Multilevel thoracic ?spondylosis. ?IMPRESSION: ?1. Stable chest, no acute process. ? ?05/02/21- 80 YOM never smoker followed for OSA, history UPPP. Complicated by Post polio, AFib/ pacemaker/ Coumadin, CAD/CABG/ CHF,  DM 2, CKD4, HBP, gout, PCP Dr Melford Aase, Covid infection 02/11/20, ?MGUS, ?CPAP 12/ [facility]-AutoSet AirSense 10   also has an AirSense 11 ?Uses older machine mostly- ?Download-      ?Body weight today ?Covid vax-3 Phizer ?Flu vax-had ?-----Patient states that he needs the settings to be 12 on the new CPAP machine. Patient states that he is currently using old machine. ?He hadn't btyhered to get new machine out of box while old machine was working, but ready to  change now. Wants to continue CPAP 12. ?Incidentally fell yesterday- R wrist swollen. He has an orthopedist and will get it seen to.  ? ? ?ROS-HPI  + = positive ?Constitutional:   No-   weight loss, night sweats, fevers, chills, fatigue, lassitude. ?HEENT:   No-  headaches, difficulty swallowing, tooth/dental problems, sore throat,  ?     No-  sneezing, itching, ear ache, nasal congestion, post nasal drip,  ?CV:  No-   chest pain, orthopnea, PND, swelling in lower extremities, anasarca, dizziness, palpitations ?Resp: +shortness of breath with exertion or at rest,  cough ?           No-   No- coughing up of blood.   ?           No-   change in color of mucus.  No- wheezing.   ?Skin: No-   rash or lesions. ?GI:  No-   heartburn, indigestion, abdominal pain, nausea, vomiting,  ?GU:  ?MS: + joint pain or swelling.  + back pain ?Neuro-     nothing unusual ?Psych:  No- change in mood or affect. No depression or anxiety.  No memory loss. ? ?OBJ ?General- Alert, Oriented, Affect-appropriate, Distress- none acute, + obese, +wheelchair ?Skin- +bruise on arm ?Lymphadenopathy- none ?Head- atraumatic ?           Eyes- Gross vision intact, PERRLA, conjunctivae clear secretions ?           Ears- Hearing, canals-normal ?           Nose- Clear, no-Septal dev, mucus, polyps, erosion, perforation  ?           Throat-  Mallampati III/ +sp UPPP , mucosa clear , drainage- none, tonsils- atrophic ?Neck- flexible , trachea midline, no stridor , thyroid nl, carotid no bruit ?Chest - symmetrical excursion , unlabored ?          Heart/CV- RRR , no murmur , no gallop  , no rub, nl s1 s2 ?                          - JVD- none , edema- none, stasis changes- none, varices- none ?          Lung- +clear/ unlabored, wheeze- none, cough- none , dullness-none, rub- none ?          Chest wall-  + pacemaker L, + back brace ?Abd- ?Br/ Gen/ Rectal- Not done, not indicated ?Extrem- R wrist swollen, tender ?Neuro- grossly intact to observation ? ? ? ?

## 2021-05-02 ENCOUNTER — Other Ambulatory Visit: Payer: Self-pay | Admitting: Student

## 2021-05-02 ENCOUNTER — Encounter: Payer: Self-pay | Admitting: Internal Medicine

## 2021-05-02 ENCOUNTER — Other Ambulatory Visit: Payer: Self-pay

## 2021-05-02 ENCOUNTER — Ambulatory Visit (INDEPENDENT_AMBULATORY_CARE_PROVIDER_SITE_OTHER): Payer: Medicare HMO | Admitting: Internal Medicine

## 2021-05-02 VITALS — BP 118/50 | HR 68 | Temp 99.0°F | Ht 69.5 in | Wt 208.0 lb

## 2021-05-02 DIAGNOSIS — I4819 Other persistent atrial fibrillation: Secondary | ICD-10-CM | POA: Diagnosis not present

## 2021-05-02 DIAGNOSIS — G4733 Obstructive sleep apnea (adult) (pediatric): Secondary | ICD-10-CM

## 2021-05-02 NOTE — Patient Instructions (Signed)
Order- DME [facility]- please change CPAP to 12 fixed ? ?

## 2021-05-03 ENCOUNTER — Encounter (HOSPITAL_COMMUNITY): Payer: Self-pay

## 2021-05-03 ENCOUNTER — Other Ambulatory Visit: Payer: Self-pay

## 2021-05-03 ENCOUNTER — Ambulatory Visit (HOSPITAL_COMMUNITY)
Admission: RE | Admit: 2021-05-03 | Discharge: 2021-05-03 | Disposition: A | Discharge status: Home / Self Care | Payer: Medicare HMO | Source: Ambulatory Visit | Attending: Diagnostic Radiology | Admitting: Diagnostic Radiology

## 2021-05-03 ENCOUNTER — Ambulatory Visit (HOSPITAL_COMMUNITY)
Admission: RE | Admit: 2021-05-03 | Discharge: 2021-05-03 | Disposition: A | Discharge status: Home / Self Care | Payer: Medicare HMO | Source: Ambulatory Visit | Attending: Hematology | Admitting: Hematology

## 2021-05-03 DIAGNOSIS — I255 Ischemic cardiomyopathy: Secondary | ICD-10-CM | POA: Diagnosis not present

## 2021-05-03 DIAGNOSIS — I129 Hypertensive chronic kidney disease with stage 1 through stage 4 chronic kidney disease, or unspecified chronic kidney disease: Secondary | ICD-10-CM | POA: Insufficient documentation

## 2021-05-03 DIAGNOSIS — E1122 Type 2 diabetes mellitus with diabetic chronic kidney disease: Secondary | ICD-10-CM | POA: Insufficient documentation

## 2021-05-03 DIAGNOSIS — D649 Anemia, unspecified: Secondary | ICD-10-CM | POA: Insufficient documentation

## 2021-05-03 DIAGNOSIS — N183 Chronic kidney disease, stage 3 unspecified: Secondary | ICD-10-CM | POA: Diagnosis not present

## 2021-05-03 DIAGNOSIS — Z87442 Personal history of urinary calculi: Secondary | ICD-10-CM | POA: Diagnosis not present

## 2021-05-03 DIAGNOSIS — M19041 Primary osteoarthritis, right hand: Secondary | ICD-10-CM | POA: Insufficient documentation

## 2021-05-03 DIAGNOSIS — E785 Hyperlipidemia, unspecified: Secondary | ICD-10-CM | POA: Insufficient documentation

## 2021-05-03 DIAGNOSIS — I480 Paroxysmal atrial fibrillation: Secondary | ICD-10-CM | POA: Diagnosis not present

## 2021-05-03 DIAGNOSIS — Z85828 Personal history of other malignant neoplasm of skin: Secondary | ICD-10-CM | POA: Insufficient documentation

## 2021-05-03 DIAGNOSIS — R768 Other specified abnormal immunological findings in serum: Secondary | ICD-10-CM | POA: Insufficient documentation

## 2021-05-03 DIAGNOSIS — Z951 Presence of aortocoronary bypass graft: Secondary | ICD-10-CM | POA: Diagnosis not present

## 2021-05-03 DIAGNOSIS — W19XXXA Unspecified fall, initial encounter: Secondary | ICD-10-CM | POA: Diagnosis not present

## 2021-05-03 DIAGNOSIS — Y92009 Unspecified place in unspecified non-institutional (private) residence as the place of occurrence of the external cause: Secondary | ICD-10-CM | POA: Diagnosis not present

## 2021-05-03 DIAGNOSIS — Z9889 Other specified postprocedural states: Secondary | ICD-10-CM | POA: Insufficient documentation

## 2021-05-03 DIAGNOSIS — E782 Mixed hyperlipidemia: Secondary | ICD-10-CM | POA: Diagnosis not present

## 2021-05-03 DIAGNOSIS — I442 Atrioventricular block, complete: Secondary | ICD-10-CM | POA: Insufficient documentation

## 2021-05-03 DIAGNOSIS — D696 Thrombocytopenia, unspecified: Secondary | ICD-10-CM | POA: Diagnosis not present

## 2021-05-03 DIAGNOSIS — Z955 Presence of coronary angioplasty implant and graft: Secondary | ICD-10-CM | POA: Diagnosis not present

## 2021-05-03 DIAGNOSIS — G14 Postpolio syndrome: Secondary | ICD-10-CM | POA: Insufficient documentation

## 2021-05-03 DIAGNOSIS — I2510 Atherosclerotic heart disease of native coronary artery without angina pectoris: Secondary | ICD-10-CM | POA: Insufficient documentation

## 2021-05-03 DIAGNOSIS — M79641 Pain in right hand: Secondary | ICD-10-CM | POA: Diagnosis not present

## 2021-05-03 DIAGNOSIS — G4733 Obstructive sleep apnea (adult) (pediatric): Secondary | ICD-10-CM | POA: Insufficient documentation

## 2021-05-03 DIAGNOSIS — I10 Essential (primary) hypertension: Secondary | ICD-10-CM | POA: Insufficient documentation

## 2021-05-03 DIAGNOSIS — M7989 Other specified soft tissue disorders: Secondary | ICD-10-CM | POA: Insufficient documentation

## 2021-05-03 DIAGNOSIS — M1990 Unspecified osteoarthritis, unspecified site: Secondary | ICD-10-CM | POA: Insufficient documentation

## 2021-05-03 DIAGNOSIS — E119 Type 2 diabetes mellitus without complications: Secondary | ICD-10-CM | POA: Insufficient documentation

## 2021-05-03 DIAGNOSIS — Q939 Deletion from autosomes, unspecified: Secondary | ICD-10-CM | POA: Insufficient documentation

## 2021-05-03 DIAGNOSIS — Z8719 Personal history of other diseases of the digestive system: Secondary | ICD-10-CM | POA: Insufficient documentation

## 2021-05-03 DIAGNOSIS — D472 Monoclonal gammopathy: Secondary | ICD-10-CM | POA: Insufficient documentation

## 2021-05-03 DIAGNOSIS — Q998 Other specified chromosome abnormalities: Secondary | ICD-10-CM | POA: Insufficient documentation

## 2021-05-03 DIAGNOSIS — N2889 Other specified disorders of kidney and ureter: Secondary | ICD-10-CM | POA: Diagnosis not present

## 2021-05-03 DIAGNOSIS — Z950 Presence of cardiac pacemaker: Secondary | ICD-10-CM | POA: Insufficient documentation

## 2021-05-03 LAB — CBC WITH DIFFERENTIAL/PLATELET
Abs Immature Granulocytes: 0.03 10*3/uL (ref 0.00–0.07)
Basophils Absolute: 0 10*3/uL (ref 0.0–0.1)
Basophils Relative: 0 %
Eosinophils Absolute: 0.1 10*3/uL (ref 0.0–0.5)
Eosinophils Relative: 1 %
HCT: 33.8 % — ABNORMAL LOW (ref 39.0–52.0)
Hemoglobin: 11.1 g/dL — ABNORMAL LOW (ref 13.0–17.0)
Immature Granulocytes: 0 %
Lymphocytes Relative: 12 %
Lymphs Abs: 0.9 10*3/uL (ref 0.7–4.0)
MCH: 32.2 pg (ref 26.0–34.0)
MCHC: 32.8 g/dL (ref 30.0–36.0)
MCV: 98 fL (ref 80.0–100.0)
Monocytes Absolute: 0.9 10*3/uL (ref 0.1–1.0)
Monocytes Relative: 12 %
Neutro Abs: 5.6 10*3/uL (ref 1.7–7.7)
Neutrophils Relative %: 75 %
Platelets: 91 10*3/uL — ABNORMAL LOW (ref 150–400)
RBC: 3.45 MIL/uL — ABNORMAL LOW (ref 4.22–5.81)
RDW: 13.2 % (ref 11.5–15.5)
WBC: 7.4 10*3/uL (ref 4.0–10.5)
nRBC: 0 % (ref 0.0–0.2)

## 2021-05-03 LAB — GLUCOSE, CAPILLARY: Glucose-Capillary: 108 mg/dL — ABNORMAL HIGH (ref 70–99)

## 2021-05-03 MED ORDER — FENTANYL CITRATE (PF) 100 MCG/2ML IJ SOLN
INTRAMUSCULAR | Status: AC | PRN
  Administered 2021-05-03 (×2): 50 ug via INTRAVENOUS

## 2021-05-03 MED ORDER — FENTANYL CITRATE (PF) 100 MCG/2ML IJ SOLN
INTRAMUSCULAR | Status: AC
  Filled 2021-05-03: qty 2

## 2021-05-03 MED ORDER — MIDAZOLAM HCL 2 MG/2ML IJ SOLN
INTRAMUSCULAR | Status: AC
  Filled 2021-05-03: qty 4

## 2021-05-03 MED ORDER — MIDAZOLAM HCL 2 MG/2ML IJ SOLN
INTRAMUSCULAR | Status: AC | PRN
  Administered 2021-05-03 (×2): 1 mg via INTRAVENOUS

## 2021-05-03 MED ORDER — SODIUM CHLORIDE 0.9 % IV SOLN: INTRAVENOUS | Status: DC

## 2021-05-03 NOTE — Discharge Instructions (Addendum)
Please call Interventional Radiology clinic [phone] with any questions or concerns. ? ?You may remove your dressing and shower tomorrow. ? ?Please follow up with your primary care physician for right hand swelling after fall.  There is no evidence of fracture or dislocation from xray taken today, per Dr. Marijo Conception. ? ? ?Bone Marrow Aspiration and Bone Marrow Biopsy, Adult, Care After ?This sheet gives you information about how to care for yourself after your procedure. Your health care provider may also give you more specific instructions. If you have problems or questions, contact your health care provider. ?What can I expect after the procedure? ?After the procedure, it is common to have: ?Mild pain and tenderness. ?Swelling. ?Bruising. ?Follow these instructions at home: ?Puncture site care ?Follow instructions from your health care provider about how to take care of the puncture site. Make sure you: ?Wash your hands with soap and water before and after you change your bandage (dressing). If soap and water are not available, use hand sanitizer. ?Change your dressing as told by your health care provider. ?Check your puncture site every day for signs of infection. Check for: ?More redness, swelling, or pain. ?Fluid or blood. ?Warmth. ?Pus or a bad smell.   ?Activity ?Return to your normal activities as told by your health care provider. Ask your health care provider what activities are safe for you. ?Do not lift anything that is heavier than 10 lb (4.5 kg), or the limit that you are told, until your health care provider says that it is safe. ?Do not drive for 24 hours if you were given a sedative during your procedure. ?General instructions ?Take over-the-counter and prescription medicines only as told by your health care provider. ?Do not take baths, swim, or use a hot tub until your health care provider approves. Ask your health care provider if you may take showers. You may only be allowed to take  sponge baths. ?If directed, put ice on the affected area. To do this: ?Put ice in a plastic bag. ?Place a towel between your skin and the bag. ?Leave the ice on for 20 minutes, 2-3 times a day. ?Keep all follow-up visits as told by your health care provider. This is important.   ?Contact a health care provider if: ?Your pain is not controlled with medicine. ?You have a fever. ?You have more redness, swelling, or pain around the puncture site. ?You have fluid or blood coming from the puncture site. ?Your puncture site feels warm to the touch. ?You have pus or a bad smell coming from the puncture site. ?Summary ?After the procedure, it is common to have mild pain, tenderness, swelling, and bruising. ?Follow instructions from your health care provider about how to take care of the puncture site and what activities are safe for you. ?Take over-the-counter and prescription medicines only as told by your health care provider. ?Contact a health care provider if you have any signs of infection, such as fluid or blood coming from the puncture site. ?This information is not intended to replace advice given to you by your health care provider. Make sure you discuss any questions you have with your health care provider. ?Document Revised: 06/11/2018 Document Reviewed: 06/11/2018 ?Elsevier Patient Education ? [place]. ? ? ?Moderate Conscious Sedation, Adult, Care After ?This sheet gives you information about how to care for yourself after your procedure. Your health care provider may also give you more specific instructions. If you have problems or questions, contact your health  care provider. ?What can I expect after the procedure? ?After the procedure, it is common to have: ?Sleepiness for several hours. ?Impaired judgment for several hours. ?Difficulty with balance. ?Vomiting if you eat too soon. ?Follow these instructions at home: ?For the time period you were told by your health care provider: ?Rest. ?Do not  participate in activities where you could fall or become injured. ?Do not drive or use machinery. ?Do not drink alcohol. ?Do not take sleeping pills or medicines that cause drowsiness. ?Do not make important decisions or sign legal documents. ?Do not take care of children on your own.  ?  ?  ?Eating and drinking ?Follow the diet recommended by your health care provider. ?Drink enough fluid to keep your urine pale yellow. ?If you vomit: ?Drink water, juice, or soup when you can drink without vomiting. ?Make sure you have little or no nausea before eating solid foods.   ?General instructions ?Take over-the-counter and prescription medicines only as told by your health care provider. ?Have a responsible adult stay with you for the time you are told. It is important to have someone help care for you until you are awake and alert. ?Do not smoke. ?Keep all follow-up visits as told by your health care provider. This is important. ?Contact a health care provider if: ?You are still sleepy or having trouble with balance after 24 hours. ?You feel light-headed. ?You keep feeling nauseous or you keep vomiting. ?You develop a rash. ?You have a fever. ?You have redness or swelling around the IV site. ?Get help right away if: ?You have trouble breathing. ?You have new-onset confusion at home. ?Summary ?After the procedure, it is common to feel sleepy, have impaired judgment, or feel nauseous if you eat too soon. ?Rest after you get home. Know the things you should not do after the procedure. ?Follow the diet recommended by your health care provider and drink enough fluid to keep your urine pale yellow. ?Get help right away if you have trouble breathing or new-onset confusion at home. ?This information is not intended to replace advice given to you by your health care provider. Make sure you discuss any questions you have with your health care provider. ?Document Revised: 05/23/2019 Document Reviewed: 12/19/2018 ?Elsevier Patient  Education ? [place].  ?

## 2021-05-03 NOTE — Consult Note (Signed)
? ?Chief Complaint: ?Patient was seen in consultation today for CT-guided bone marrow biopsy ? ? ?Referring Physician(s): ?Kale,Gautam Kishore ? ?Supervising Physician: Markus Daft ? ?Patient Status: [place] ? ?History of Present Illness: ?Alan [name] is a 81 y.o. 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  He presents today for CT-guided bone marrow biopsy for further evaluation, rule out myeloma/AL amyloidosis.  Patient also has had recent falls and now with swelling/pain of right hand and wrist region. ? ?Past Medical History:  ?Diagnosis Date  ? Anticoagulant long-term use   ? Arthritis   ? Arthropathy of left shoulder   ? Atypical mole   ? Biceps tendonitis, left   ? Bursitis of left shoulder   ? Cardiac pacemaker in situ   ? first insertion 2008;  genertor and new lead change 03-08-2012;   Medtronic  ? CHB (complete heart block) ([place])   ? s/p  PPM 2008  ? Chronic back pain   ? CKD (chronic kidney disease), stage III ([place])   ? Coronary artery disease cardiologist--- dr croitoru  ? hx  CABG x2  1990 and re-do 2004;  multiple PCI to [place] to 2004;    cardiac cath 2012  occluded LAD and RCA with patent grafts  ? Difficult intubation   ? " Dr. Sallyanne Kuster said that it was difficulty to get the tube in."  ? History of coronary angioplasty   ? multiple PCI to RCA ,  1990 to 2004  ? History of GI bleed followed by GI-- Olevia Perches PA @ [facility] in [place]  ? 01/ 2020  upper and lower GI bleed ;  s/p  EGD with cautery of jejunal bleed and blood transfusion's  ? History of kidney stones   ?  Hx of echocardiogram 06/29/2008  ? EF 45-50%   ? Hypertension   ? Ischemic cardiomyopathy   ? previously reported , ef 40-45%;  2010--- ef 45-50%;   last echo 2017 55-60%  ? Mixed hyperlipidemia   ? Nocturia   ? OSA on CPAP   ? cpap, 12, last sleep study Jan2013, sees Dr. Keturah Barre  ? PAF (paroxysmal atrial fibrillation) ([place])   ? long hx PAF--- followed by dr croitoru  ? Post-polio syndrome   ? dx polio age 40;   09-30-2018  per pt occasional gait issues and occasion fall  ? Presence of permanent cardiac pacemaker   ? Pseudoarthrosis of lumbar spine   ? Rotator cuff tear, left   ? S/P CABG x 2   ? 1990 x2  and 2004  x2  ? Sinus node dysfunction (HCC)   ? 2015--- sinus node arrest  with intact AV conduction  ? Squamous cell carcinoma of skin   ? Type 2 diabetes mellitus ([place])   ? followed by pcp  ? ? ?Past Surgical History:  ?Procedure Laterality Date  ? APPENDECTOMY  child  ? APPLICATION OF INTRAOPERATIVE CT SCAN N/A 07/20/2020  ? Procedure: APPLICATION OF INTRAOPERATIVE CT SCAN;  Surgeon: [name], Theodoro Doing, DO;  Location: [place];  Service: Neurosurgery;  Laterality: N/A;  ? BLEPHAROPLASTY Bilateral 12/2014  ? upper eyelid  ? CARDIAC PACEMAKER PLACEMENT  2008  ? CARDIOVERSION N/A 01/15/2019  ?  Procedure: CARDIOVERSION;  Surgeon: Sanda Klein, MD;  Location: [facility];  Service: Cardiovascular;  Laterality: N/A;  ? CARPAL TUNNEL RELEASE Bilateral 2015;  2016  ? CATARACT EXTRACTION W/ INTRAOCULAR LENS  IMPLANT, BILATERAL  2017  ? CORONARY ANGIOPLASTY  1990  to 2004  ? multiple to RCA  ? CORONARY ARTERY BYPASS GRAFT  1990    @ [place] (dr chitwood)  ? seq. LIMA to LAD and Diagonal  ? CORONARY ARTERY BYPASS GRAFT  2004     dr Amador Cunas  ? SVG to  RCA and PDA;  ALSO MAZE  PROCEDURE  ? ESOPHAGOGASTRODUODENOSCOPY Left 03/18/2013  ? Procedure: ESOPHAGOGASTRODUODENOSCOPY (EGD);  Surgeon: Cleotis Nipper, MD;  Location: [facility] ENDOSCOPY;  Service: Endoscopy;  Laterality: Left;  ? HARDWARE REMOVAL N/A 05/18/2016  ?  Procedure: Removal of broken hardware Lumbar three-four;  Surgeon: [name], MD;  Location: [place];  Service: Neurosurgery;  Laterality: N/A;  ? HERNIA REPAIR    ? LEFT HEART CATH AND CORS/GRAFTS ANGIOGRAPHY N/A 12/18/2018  ? Procedure: LEFT HEART CATH AND CORS/GRAFTS ANGIOGRAPHY;  Surgeon: Wellington Hampshire, MD;  Location: [place] CV LAB;  Service: Cardiovascular;  Laterality: N/A;  ? LEFT HEART CATHETERIZATION WITH CORONARY/GRAFT ANGIOGRAM N/A 12/21/2010  ? Procedure: LEFT HEART CATHETERIZATION WITH Beatrix Fetters;  Surgeon: Leonie Man, MD;  Location: [facility] CATH LAB;  Service: Cardiovascular;  Laterality: N/A;  ? LEG SURGERY Right 2002  approx.  ? lengthened his leg  ? LUMBAR LAMINECTOMY/DECOMPRESSION MICRODISCECTOMY  01/10/2012  ? Procedure: LUMBAR LAMINECTOMY/DECOMPRESSION MICRODISCECTOMY 1 LEVEL;  Surgeon: [name], MD;  Location: [place] NEURO ORS;  Service: Neurosurgery;  Laterality: Bilateral;  Lumbar three-four decompression, Posterior lateral fusion lumbar three-four, posterior spinus plate lumbar three-four  ? LUMBAR LAMINECTOMY/DECOMPRESSION MICRODISCECTOMY Left 05/18/2016  ? Procedure: Laminectomy and Foraminotomy - Lumbar two-lumbar three- Lumbar four-lumbar five- left with removal hardware lumbar three-four;  Surgeon: [name], MD;  Location: [place];  Service: Neurosurgery;  Laterality: Left;  ? NASAL SEPTUM SURGERY  yrs ago  ? PACEMAKER REVISION N/A 03/07/2012  ? Procedure: PACEMAKER REVISION;  Surgeon: Sanda Klein, MD;  Location: [place] CATH LAB;  Service: Cardiovascular;  Laterality: N/A;  ? POSTERIOR LUMBAR FUSION  02-22-2017   dr Ronnald Ramp  @ [facility]  ? re-do laminectomy L2-3 and fusion  ? POSTERIOR LUMBAR FUSION  03-11-2018   dr Ronnald Ramp @ [facility]  ? laminectomy L4-5;  fixation L2-S1 and fusion L3-S1  ? SACROILIAC JOINT FUSION N/A 07/20/2020  ? Procedure: Sacroiliac Joint Fusion;  Surgeon: Karsten Ro, DO;  Location: [place];  Service: Neurosurgery;  Laterality: N/A;  ? SHOULDER ARTHROSCOPY  Right after 2016, pt unsure year  ? SHOULDER ARTHROSCOPY WITH OPEN ROTATOR CUFF REPAIR Right 03/30/2014  ? Procedure: RIGHT SHOULDER ARTHROSCOPY  OPEN ROTATOR CUFF REPAIR;  Surgeon: Kerin Salen, MD;  Location: [place];  Service: Orthopedics;  Laterality: Right;  ? SHOULDER ARTHROSCOPY WITH SUBACROMIAL DECOMPRESSION, ROTATOR CUFF REPAIR AND BICEP TENDON REPAIR Left 10/01/2018  ? Procedure: LEFT SHOULDER ARTHROSCOPY, DISTAL CLAVICLE EXCISION,SUBACROMIAL, DECOMPRESSION, RORATOR CUFF REPAIR, BICEPS TENODESIS;  Surgeon: Renette Butters, MD;  Location: [place];  Service: Orthopedics;  Laterality: Left;  ? SHOULDER OPEN ROTATOR CUFF REPAIR Right 03/30/2014  ? Procedure: ROTATOR CUFF REPAIR SHOULDER OPEN;  Surgeon: Kerin Salen, MD;  Location: [place];  Service: Orthopedics;  Laterality: Right;  ? TEE WITHOUT CARDIOVERSION N/A 01/15/2019  ? Procedure: TRANSESOPHAGEAL ECHOCARDIOGRAM (TEE);  Surgeon: Sanda Klein, MD;  Location:  [facility] ENDOSCOPY;  Service: Cardiovascular;  Laterality: N/A;  ? TONSILLECTOMY  child  ? TOTAL KNEE ARTHROPLASTY Left 2006  ? ? ?Allergies: ?Patient has no known allergies. ? ?Medications: ?Prior to Admission medications   ?Medication Sig Start Date End Date Taking? Authorizing Provider  ?acetaminophen (TYLENOL) 500 MG tablet Take 1,000 mg by mouth every 6 (six) hours as needed for mild pain or headache.   Yes [provider]  ?amiodarone (PACERONE) 200 MG tablet Take 1 tablet (200 mg total) by mouth daily. 04/22/21  Yes [name], Mihai, MD  ?carvedilol (COREG) 25 MG tablet Take 2 tablets (50 mg total) by mouth 2 (two) times daily with a meal. 04/07/21  Yes Larey Dresser, MD  ?empagliflozin (JARDIANCE) 10 MG TABS tablet Take 10 mg by mouth daily. 02/11/21  Yes [provider]  ?ferrous sulfate 325 (65 FE) MG EC tablet Take 325 mg by mouth 2 (two) times daily.   Yes [provider]  ?glipiZIDE (GLUCOTROL) 10 MG tablet Take 10 mg by mouth 2 (two) times daily.    Yes [provider]  ?insulin lispro (HUMALOG) 100 UNIT/ML injection Inject 2-8 Units into the skin as needed for high blood sugar. Take as needed based on CBG value   Yes [provider]  ?losar

## 2021-05-03 NOTE — Procedures (Addendum)
Interventional Radiology Procedure: ? ? ?Indications: Monoclonal paraproteinemia ? ?Procedure: CT guided bone marrow biopsy ? ?Findings: 2 aspirates and 1 core from left ilium ? ?Complications: None ?    ?EBL: Minimal, less than 10 ml ? ?Plan: Discharge to home in one hour. ? ? ?Fareeda Downard R. Anselm Pancoast, MD  ?Pager: [phone] ? ?  ?

## 2021-05-04 ENCOUNTER — Inpatient Hospital Stay: Payer: Medicare HMO | Admitting: Hematology

## 2021-05-06 ENCOUNTER — Encounter (HOSPITAL_COMMUNITY): Payer: Medicare HMO

## 2021-05-06 ENCOUNTER — Telehealth: Payer: Self-pay | Admitting: Internal Medicine

## 2021-05-06 NOTE — Telephone Encounter (Signed)
Called and spoke with patient. Patient stated that someone called him from [employer] and told him that his settings for his cpap has been changed but patient says that they are still the same.  Advised patient to call [employer] to f/u on his settings for his cpap.  ?

## 2021-05-10 ENCOUNTER — Encounter (HOSPITAL_COMMUNITY): Payer: Self-pay | Admitting: Cardiology

## 2021-05-10 ENCOUNTER — Inpatient Hospital Stay: Payer: Medicare HMO | Attending: Hematology | Admitting: Hematology

## 2021-05-10 ENCOUNTER — Other Ambulatory Visit: Payer: Self-pay

## 2021-05-10 ENCOUNTER — Encounter (HOSPITAL_COMMUNITY): Payer: Self-pay

## 2021-05-10 ENCOUNTER — Telehealth (HOSPITAL_COMMUNITY): Payer: Self-pay

## 2021-05-10 ENCOUNTER — Telehealth (HOSPITAL_COMMUNITY): Payer: Self-pay | Admitting: Pharmacy Technician

## 2021-05-10 ENCOUNTER — Ambulatory Visit: Payer: Medicare HMO | Admitting: Physician Assistant

## 2021-05-10 ENCOUNTER — Other Ambulatory Visit (HOSPITAL_COMMUNITY): Payer: Self-pay

## 2021-05-10 ENCOUNTER — Ambulatory Visit (HOSPITAL_COMMUNITY)
Admission: RE | Admit: 2021-05-10 | Discharge: 2021-05-10 | Disposition: A | Discharge status: Home / Self Care | Payer: Medicare HMO | Source: Ambulatory Visit | Attending: Cardiology | Admitting: Cardiology

## 2021-05-10 VITALS — BP 140/80 | HR 64

## 2021-05-10 VITALS — BP 136/90 | HR 84 | Temp 97.5°F | Resp 20 | Wt 213.7 lb

## 2021-05-10 DIAGNOSIS — C9000 Multiple myeloma not having achieved remission: Secondary | ICD-10-CM | POA: Insufficient documentation

## 2021-05-10 DIAGNOSIS — Z950 Presence of cardiac pacemaker: Secondary | ICD-10-CM | POA: Diagnosis not present

## 2021-05-10 DIAGNOSIS — Z7901 Long term (current) use of anticoagulants: Secondary | ICD-10-CM | POA: Insufficient documentation

## 2021-05-10 DIAGNOSIS — I129 Hypertensive chronic kidney disease with stage 1 through stage 4 chronic kidney disease, or unspecified chronic kidney disease: Secondary | ICD-10-CM | POA: Insufficient documentation

## 2021-05-10 DIAGNOSIS — G5603 Carpal tunnel syndrome, bilateral upper limbs: Secondary | ICD-10-CM | POA: Insufficient documentation

## 2021-05-10 DIAGNOSIS — I130 Hypertensive heart and chronic kidney disease with heart failure and stage 1 through stage 4 chronic kidney disease, or unspecified chronic kidney disease: Secondary | ICD-10-CM | POA: Diagnosis not present

## 2021-05-10 DIAGNOSIS — Z79899 Other long term (current) drug therapy: Secondary | ICD-10-CM | POA: Insufficient documentation

## 2021-05-10 DIAGNOSIS — N184 Chronic kidney disease, stage 4 (severe): Secondary | ICD-10-CM | POA: Diagnosis not present

## 2021-05-10 DIAGNOSIS — I480 Paroxysmal atrial fibrillation: Secondary | ICD-10-CM | POA: Diagnosis not present

## 2021-05-10 DIAGNOSIS — Z7984 Long term (current) use of oral hypoglycemic drugs: Secondary | ICD-10-CM | POA: Diagnosis not present

## 2021-05-10 DIAGNOSIS — D472 Monoclonal gammopathy: Secondary | ICD-10-CM | POA: Diagnosis not present

## 2021-05-10 DIAGNOSIS — I2510 Atherosclerotic heart disease of native coronary artery without angina pectoris: Secondary | ICD-10-CM | POA: Diagnosis not present

## 2021-05-10 DIAGNOSIS — N183 Chronic kidney disease, stage 3 unspecified: Secondary | ICD-10-CM | POA: Insufficient documentation

## 2021-05-10 DIAGNOSIS — Z45018 Encounter for adjustment and management of other part of cardiac pacemaker: Secondary | ICD-10-CM | POA: Diagnosis not present

## 2021-05-10 DIAGNOSIS — I255 Ischemic cardiomyopathy: Secondary | ICD-10-CM | POA: Insufficient documentation

## 2021-05-10 DIAGNOSIS — I442 Atrioventricular block, complete: Secondary | ICD-10-CM | POA: Insufficient documentation

## 2021-05-10 DIAGNOSIS — Z8719 Personal history of other diseases of the digestive system: Secondary | ICD-10-CM | POA: Diagnosis not present

## 2021-05-10 DIAGNOSIS — I5032 Chronic diastolic (congestive) heart failure: Secondary | ICD-10-CM | POA: Insufficient documentation

## 2021-05-10 DIAGNOSIS — Z951 Presence of aortocoronary bypass graft: Secondary | ICD-10-CM | POA: Insufficient documentation

## 2021-05-10 DIAGNOSIS — D649 Anemia, unspecified: Secondary | ICD-10-CM | POA: Insufficient documentation

## 2021-05-10 LAB — COMPREHENSIVE METABOLIC PANEL
ALT: 27 U/L (ref 0–44)
AST: 25 U/L (ref 15–41)
Albumin: 3.3 g/dL — ABNORMAL LOW (ref 3.5–5.0)
Alkaline Phosphatase: 85 U/L (ref 38–126)
Anion gap: 8 (ref 5–15)
BUN: 43 mg/dL — ABNORMAL HIGH (ref 8–23)
CO2: 21 mmol/L — ABNORMAL LOW (ref 22–32)
Calcium: 9.3 mg/dL (ref 8.9–10.3)
Chloride: 110 mmol/L (ref 98–111)
Creatinine, Ser: 2.22 mg/dL — ABNORMAL HIGH (ref 0.61–1.24)
GFR, Estimated: 29 mL/min — ABNORMAL LOW (ref >60)
Glucose, Bld: 99 mg/dL (ref 70–99)
Potassium: 4.5 mmol/L (ref 3.5–5.1)
Sodium: 139 mmol/L (ref 135–145)
Total Bilirubin: 0.4 mg/dL (ref 0.3–1.2)
Total Protein: 7.9 g/dL (ref 6.5–8.1)

## 2021-05-10 MED ORDER — CLOPIDOGREL BISULFATE 75 MG PO TABS: 75.0000 mg | ORAL_TABLET | Freq: Every day | ORAL | 3 refills | Status: DC

## 2021-05-10 NOTE — Progress Notes (Signed)
PCP: [name], MD ?Cardiology: Dr. Sallyanne Kuster ?HF Cardiology: Dr. Aundra Dubin ? ?81 y.o. with history of complete heart block s/p pacemaker, CAD s/p CABG and redo, CKD stage 3, GI bleeding, and chronic diastolic CHF was referred to CHF clinic from the [facility] clinic. Patient has a long history of CAD. He had his initial CABG in 1990 then redo in 2004. Last cath in 11/20 showed patent sequential LIMA-LAD/D and SVG-PDA, medical management.  He has a Medtronic PPM due to complete heart block, has had this for years.  He has paroxysmal atrial fibrillation, in NSR today. He is not on anticoagulation due to GI bleeding.  He has not had suitable anatomy for Watchman due to surgical clipping with partial closure of the LA appendage.  ? ?Patient has struggled recently with diastolic CHF.  He was admitted in 12/22 with CHF exacerbation.  Echo in 12/22 showed EF 55-60%, mild LVH, normal RV, moderate LAE. With diastolic CHF, PAF, biceps tendon rupture, and bilateral carpal tunnel syndrome, cardiac amyloidosis was considered.  PYP scan was not suggestive of TTR cardiac amyloidosis. 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 He has been seen by hematology, bone marrow biopsy in 3/23 showed plasma cell neoplasm with >10% clonal plasma cells.   ? ?Patient returns for followup of CHF.  He denies dyspnea walking on flat ground.  Weight down 1.5 lbs.  No palpitations.  He is a-paced today.  No chest pain.  No orthopnea/PND.  Edema has resolved.  Currently taking Jardiance + torsemide 20 mg daily alternating with 10 mg daily.  ? ?ECG (personally reviewed): a-paced, long 1st degree AVB, RBBB/LAFB (no change from prior) ? ?Labs (1/23): K 4, creatinine 2.37, myeloma panel with IgG monoclonal protein with kappa light chain specificity, urine immunofixation with IgG monoclonal protein. ?Labs (2/23): K 4.8, creatinine 2.9 => 2.55, LFTs normal, TSH normal, BNP 102 ?Labs (3/23): hgb 11.1, plts 91 ? ?MDT  device interrogation: 21% RV-pacing, no AF ? ?PMH: ?1. Atrial fibrillation: Paroxysmal ?2. Complete heart block: MDT PPM placed in his 50s.  ?3. Biceps tendon rupture ?4. Bilateral carpal tunnel syndrome ?5. L spine stenosis ?6. HTN ?7. Hyperlipidemia: h/o statin myopathy, muscle pain with Zetia, PCSK9 has been too expensive.  ?8. CKD stage 4 ?9. H/o GI bleeding: Not suitable Watchman candidate due to partial closure of the appendage by surgical clip.  He is not anticoagulated.  ?10. Chronic diastolic CHF: Echo (03/55) with EF 55-60%, mild LVH, normal RV, moderate LAE.  ?- PYP scan (1/23): grade 1, H/CL 0.8 ?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 Bone marrow biopsy 3/23 with plasma cell neoplasm with >10% clonal plasma cells.   ?12. CAD: CABG x 2 in 1990, redo CABG x 2 in 2004.  ?- LHC (11/20): occluded proximal LAD, 99% dLCx, occluded proximal RCA, LIMA-LAD/diagonal patent, SVG-PDA patent. Distal LCx was small, managed medically.  ?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  ? ?SH: Retired Insurance underwriter, also was a Technical brewer.  Lives in [place].  Nonsmoker.  Rare ETOH.  ? ?Family History  ?Problem Relation Age of Onset  ? Other Father   ?     MVA  ? Cancer Mother   ? ?ROS: All systems reviewed and negative except as per HPI.  ? ?Current Outpatient Medications  ?Medication Sig Dispense Refill  ? acetaminophen (TYLENOL) 500 MG tablet Take 1,000 mg by mouth every 6 (six) hours as needed for mild pain or headache.    ? amiodarone (PACERONE) 200  MG tablet Take 1 tablet (200 mg total) by mouth daily. 90 tablet 3  ? carvedilol (COREG) 25 MG tablet Take 2 tablets (50 mg total) by mouth 2 (two) times daily with a meal. 120 tablet 3  ? empagliflozin (JARDIANCE) 10 MG TABS tablet Take 10 mg by mouth daily.    ? ferrous sulfate 325 (65 FE) MG EC tablet Take 325 mg by mouth 2 (two) times daily.    ? glipiZIDE (GLUCOTROL) 10 MG tablet Take 10 mg by mouth 2 (two) times daily.    ? insulin lispro (HUMALOG) 100 UNIT/ML  injection Inject 2-8 Units into the skin as needed for high blood sugar. Take as needed based on CBG value    ? losartan (COZAAR) 50 MG tablet Take 1 tablet (50 mg total) by mouth daily. 30 tablet 6  ? metFORMIN (GLUCOPHAGE) 500 MG tablet Take 500 mg by mouth 2 (two) times daily with a meal.    ? metFORMIN (GLUCOPHAGE-XR) 500 MG 24 hr tablet Take 500 mg by mouth in the morning and at bedtime.    ? Multiple Vitamin (MULTIVITAMIN WITH MINERALS) TABS tablet Take 1 tablet by mouth daily.     ? ONETOUCH ULTRA test strip USE 1 STRIP TO CHECK GLUCOSE 4 TIMES DAILY    ? pantoprazole (PROTONIX) 40 MG tablet Take 1 tablet (40 mg total) by mouth daily. 30 tablet 0  ? torsemide (DEMADEX) 20 MG tablet Take 1 tabs Daily every other day ALTERNATING with 1/2 tab daily every other day 60 tablet 1  ? ?No current facility-administered medications for this encounter.  ? ?BP 140/80   Pulse 64   SpO2 98%  ?General: NAD ?Neck: No JVD, no thyromegaly or thyroid nodule.  ?Lungs: Clear to auscultation bilaterally with normal respiratory effort. ?CV: Nondisplaced PMI.  Heart regular S1/S2, no S3/S4, no murmur.  No peripheral edema.  No carotid bruit.  Normal pedal pulses.  ?Abdomen: Soft, nontender, no hepatosplenomegaly, no distention.  ?Skin: Intact without lesions or rashes.  ?Neurologic: Alert and oriented x 3.  ?Psych: Normal affect. ?Extremities: No clubbing or cyanosis.  ?HEENT: Normal.  ? ?Assessment/Plan: ?1. Chronic diastolic CHF: Echo 23/53 showed EF 55-60%, mild LVH, normal RV, moderate LAE.  There was concern for cardiac amyloidosis with bilateral carpal tunnel syndrome and biceps tendon rupture.  PYP scan was not suggestive of TTR cardiac amyloidosis, but myeloma panel and urine immunofixation were suggestive of monoclonal gammopathy. Bone marrow biopsy showed plasma cell neoplasm with >10% clonal plasma cells, workup is ongoing.  Given lack of rapid progression of cardiac disease, suspect he does not have cardiac AL  amyloidosis.  He is unable to get cardiac MRI, PPM is not compatible.  NYHA class II symptoms.  Weight is down and he looks euvolemic on exam.   ?- Continue torsemide 20 mg daily alternating with 10 mg daily. BMET today. ?- Continue Jardiance 10 mg daily.   ?2. Monoclonal gammopathy: Bone marrow biopsy in 3/23 with plasma cell neoplasm with >10% clonal plasma cells.  He will be undergoing PET scan and treatment will be determined by hematology.  ?3. CAD: S/p CABG and redo.  Cath in 11/20 showed patent grafts.  He denies chest pain.  ?- He is not on ASA due to GI bleeding => With no recent GI bleeding, would like him on clopidogrel 75 daily.  ?- He is not on statin due to history of statin myopathy and inability to tolerate Zetia.  PCSK9 inhibitors were too expensive  in the past and he is not interested in looking into them again.  ?4. HTN: BP controlled on Coreg 50 mg bid and losartan 50 mg daily. ?5. Atrial fibrillation: Paroxysmal.  He is in NSR today.  He is not on anticoagulation due to history of GI bleeding.  ?- Continue amiodarone 200 daily.  Recent TSH normal, he will need a regular eye exam and will need LFTs today.  ?- He is not a Watchman candidate, he has a partially closed LA appendage from surgical clip.  ?6. H/o complete heart block: He has a MDT PPM, 21% RV pacing on interrogation today but EF was normal on last echo.  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  ?7. CKD: Stage 4.  Recent creatinine was 2.55. ?If plasma cell neoplasm plays a role in CKD.  ?- BMET today.   ?- Needs referral to nephrology, should consider renal biopsy.  ? ?Followup in 3 months.  ? ?Loralie Champagne ?05/10/2021 ? ?

## 2021-05-10 NOTE — Telephone Encounter (Signed)
Advanced Heart Failure Patient Advocate Encounter ? ?The patient was approved for a [employer] grant that will help cover the cost of Jardiance. Total amount awarded, $10,000. Eligibility, 04/10/21 - 04/10/22. ? ?ID [id number] ? ?BIN [id number] ? ?PCN [id number] ? ?Group [id number] ? ?The patient was given a copy of the grant information to take to the pharmacy. ? ?Charlann Boxer, CPhT ? ? ?

## 2021-05-10 NOTE — Telephone Encounter (Signed)
Faxed referral to [facility] at 13.30pm on  05/10/2021 ?

## 2021-05-10 NOTE — Patient Instructions (Addendum)
Labs done today, your results will be available in MyChart, we will contact you for abnormal readings. ? ?You have been referred to [facility]. They will call you to arrange your appointment. ? ?You have been referred to Dr. Garret Reddish at [facility] on [facility]. They will call you to arrange an appointment. ? ?Your physician recommends that you schedule a follow-up appointment in: 3 months  ?(July 2023)  ** please call the office in May to arrange your follow up appointment ** ? ?If you have any questions or concerns before your next appointment please send [place] a message through [place] or call our office at [phone].   ? ?TO LEAVE A MESSAGE FOR THE NURSE SELECT OPTION 2, PLEASE LEAVE A MESSAGE INCLUDING: ?YOUR NAME ?DATE OF BIRTH ?CALL BACK NUMBER ?REASON FOR CALL**this is important as we prioritize the call backs ? ?YOU WILL RECEIVE A CALL BACK THE SAME DAY AS LONG AS YOU CALL BEFORE 4:00 PM ? ?At the [facility], you and your health needs are our priority. As part of our continuing mission to provide you with exceptional heart care, we have created designated Provider Care Teams. These Care Teams include your primary Cardiologist (physician) and Advanced Practice Providers (APPs- Physician Assistants and Nurse Practitioners) who all work together to provide you with the care you need, when you need it.  ? ?You may see any of the following providers on your designated Care Team at your next follow up: ?Dr Glori Bickers ?Dr Loralie Champagne ?Darrick Grinder, NP ?Lyda Jester, PA ?Jessica Milford,NP ?Marlyce Huge, PA ?Audry Riles, PharmD ? ? ?Please be sure to bring in all your medications bottles to every appointment.  ? ?

## 2021-05-10 NOTE — Progress Notes (Signed)
. ? ? ?HEMATOLOGY/ONCOLOGY CLINIC NOTE ? ?Date of Service: 05/10/2021 ? ?Patient Care Team: ?[name], MD as PCP - General (Family Medicine) ?[name], Dani Gobble, MD as PCP - Cardiology (Cardiology) ?[name], Dani Gobble, MD as Consulting Physician (Cardiology) ?Lavonna Monarch, MD as Consulting Physician (Dermatology) ?Starlyn Skeans as Librarian, academic (Dermatology) ? 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 ?HISTORY OF PRESENTING ILLNESS:  ?See previous note for details of initial presentation. ? ?INTERVAL HISTORY: ?Alan [name] is a 81 y.o. 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 He presents today accompanied by his wife. He reports He is doing well with no new symptoms or concerns. ? ?He reports some ongoing concerns with his vertigo. His wife reports that Alan [name] recently hurt his wrist after a fall. ? ?His wife notes that Alan [name] has a history of internal bleeding and repeated hospital visits. ? ?We discussed a potential referral to nephrology which he was agreeable to. ? ?He reports leg swelling has resolved. ? ?No new back, shoulder, or hips pains. ?No black stools.  ? ?24-Hr Ur UPEP from 04/06/2021 discussed with him. ?We discussed recent his recent CT scans in detail. ?Labs done 04/04/2021 were reviewed and discussed in detail with him. ?CBC stable ?CMP stable with elevated creatinine of 2.55, BUN of 55, and bld glucose 139. ? ?MEDICAL HISTORY:  ?Past Medical History:  ?Diagnosis Date  ? Anticoagulant long-term use   ? Arthritis   ? Arthropathy of left shoulder   ? Atypical mole   ? Biceps tendonitis, left   ? Bursitis of left shoulder   ? Cardiac pacemaker in situ   ? first insertion 2008;  genertor and new lead change 03-08-2012;   [employer]tronic  ? CHB (complete heart block) ([place])   ? s/p  PPM 2008  ? Chronic back pain   ? CKD (chronic  kidney disease), stage III ([place])   ? Coronary artery disease cardiologist--- dr croitoru  ? hx  CABG x2  1990 and re-do 2004;  multiple PCI to [place] to 2004;    cardiac cath 2012  occluded LAD and RCA with patent grafts  ? Difficult intubation   ? " Dr. Sallyanne Kuster said that it was difficulty to get the tube in."  ? History of coronary angioplasty   ? multiple PCI to RCA ,  1990 to 2004  ? History of GI bleed followed by GI-- [facility]erches [facility] [place]  ? 01/ 2020  upper and lower GI bleed ;  s/p  EGD with cautery of jejunal bleed and blood transfusion's  ? History of kidney stones   ? Hx of echocardiogram 06/29/2008  ? EF 45-50%   ? Hypertension   ? Ischemic cardiomyopathy   ? previously reported , ef 40-45%;  2010--- ef 45-50%;   last echo 2017 55-60%  ? Mixed hyperlipidemia   ? Nocturia   ? OSA on CPAP   ? cpap, 12, last sleep study Jan2013, sees Dr. Keturah Barre  ? PAF (paroxysmal atrial fibrillation) ([place])   ? long hx PAF--- followed by dr croitoru  ? Post-polio syndrome   ? dx polio age 63;   09-30-2018  per pt occasional gait issues and occasion fall  ? Presence of permanent cardiac pacemaker   ? Pseudoarthrosis of lumbar spine   ? Rotator cuff tear, left   ? S/P CABG x  2   ? 1990 x2  and 2004  x2  ? Sinus node dysfunction (HCC)   ? 2015--- sinus node arrest  with intact AV conduction  ? Squamous cell carcinoma of skin   ? Type 2 diabetes mellitus ([place])   ? followed by pcp  ? ? ?SURGICAL HISTORY: ?Past Surgical History:  ?Procedure Laterality Date  ? APPENDECTOMY  child  ? APPLICATION OF INTRAOPERATIVE CT SCAN N/A 07/20/2020  ? Procedure: APPLICATION OF INTRAOPERATIVE CT SCAN;  Surgeon: [name], Theodoro Doing, DO;  Location: [place];  Service: Neurosurgery;  Laterality: N/A;  ? BLEPHAROPLASTY Bilateral 12/2014  ? upper eyelid  ? CARDIAC PACEMAKER PLACEMENT  2008  ? CARDIOVERSION N/A 01/15/2019  ? Procedure: CARDIOVERSION;  Surgeon: Sanda Klein, MD;  Location: [place];  Service:  Cardiovascular;  Laterality: N/A;  ? CARPAL TUNNEL RELEASE Bilateral 2015;  2016  ? CATARACT EXTRACTION W/ INTRAOCULAR LENS  IMPLANT, BILATERAL  2017  ? CORONARY ANGIOPLASTY  1990  to 2004  ? multiple to RCA  ? CORONARY ARTERY BYPASS GRAFT  1990    @ [place] (dr chitwood)  ? seq. LIMA to LAD and Diagonal  ? CORONARY ARTERY BYPASS GRAFT  2004     dr Amador Cunas  ? SVG to  RCA and PDA;  ALSO MAZE  PROCEDURE  ? ESOPHAGOGASTRODUODENOSCOPY Left 03/18/2013  ? Procedure: ESOPHAGOGASTRODUODENOSCOPY (EGD);  Surgeon: Cleotis Nipper, MD;  Location: [facility] ENDOSCOPY;  Service: Endoscopy;  Laterality: Left;  ? HARDWARE REMOVAL N/A 05/18/2016  ? Procedure: Removal of broken hardware Lumbar three-four;  Surgeon: [name], MD;  Location: [place];  Service: Neurosurgery;  Laterality: N/A;  ? HERNIA REPAIR    ? LEFT HEART CATH AND CORS/GRAFTS ANGIOGRAPHY N/A 12/18/2018  ? Procedure: LEFT HEART CATH AND CORS/GRAFTS ANGIOGRAPHY;  Surgeon: Wellington Hampshire, MD;  Location: [place] CV LAB;  Service: Cardiovascular;  Laterality: N/A;  ? LEFT HEART CATHETERIZATION WITH CORONARY/GRAFT ANGIOGRAM N/A 12/21/2010  ? Procedure: LEFT HEART CATHETERIZATION WITH Beatrix Fetters;  Surgeon: Leonie Man, MD;  Location: [facility] CATH LAB;  Service: Cardiovascular;  Laterality: N/A;  ? LEG SURGERY Right 2002  approx.  ? lengthened his leg  ? LUMBAR LAMINECTOMY/DECOMPRESSION MICRODISCECTOMY  01/10/2012  ? Procedure: LUMBAR LAMINECTOMY/DECOMPRESSION MICRODISCECTOMY 1 LEVEL;  Surgeon: [name], MD;  Location: [place] NEURO ORS;  Service: Neurosurgery;  Laterality: Bilateral;  Lumbar three-four decompression, Posterior lateral fusion lumbar three-four, posterior spinus plate lumbar three-four  ? LUMBAR LAMINECTOMY/DECOMPRESSION MICRODISCECTOMY Left 05/18/2016  ? Procedure: Laminectomy and Foraminotomy - Lumbar two-lumbar three- Lumbar four-lumbar five- left with removal hardware lumbar three-four;  Surgeon: [name], MD;  Location: [place];   Service: Neurosurgery;  Laterality: Left;  ? NASAL SEPTUM SURGERY  yrs ago  ? PACEMAKER REVISION N/A 03/07/2012  ? Procedure: PACEMAKER REVISION;  Surgeon: Sanda Klein, MD;  Location: [place] CATH LAB;  Service: Cardiovascular;  Laterality: N/A;  ? POSTERIOR LUMBAR FUSION  02-22-2017   dr Ronnald Ramp  @ [facility]  ? re-do laminectomy L2-3 and fusion  ? POSTERIOR LUMBAR FUSION  03-11-2018   dr Ronnald Ramp @ [facility]  ? laminectomy L4-5;  fixation L2-S1 and fusion L3-S1  ? SACROILIAC JOINT FUSION N/A 07/20/2020  ? Procedure: Sacroiliac Joint Fusion;  Surgeon: Karsten Ro, DO;  Location: [place];  Service: Neurosurgery;  Laterality: N/A;  ? SHOULDER ARTHROSCOPY Right after 2016, pt unsure year  ? SHOULDER ARTHROSCOPY WITH OPEN ROTATOR CUFF REPAIR Right 03/30/2014  ? Procedure: RIGHT SHOULDER ARTHROSCOPY  OPEN ROTATOR CUFF REPAIR;  Surgeon: Kerin Salen, MD;  Location: [place];  Service: Orthopedics;  Laterality: Right;  ? SHOULDER ARTHROSCOPY WITH SUBACROMIAL DECOMPRESSION, ROTATOR CUFF REPAIR AND BICEP TENDON REPAIR Left 10/01/2018  ? Procedure: LEFT SHOULDER ARTHROSCOPY, DISTAL CLAVICLE EXCISION,SUBACROMIAL, DECOMPRESSION, RORATOR CUFF REPAIR, BICEPS TENODESIS;  Surgeon: Renette Butters, MD;  Location: [place];  Service: Orthopedics;  Laterality: Left;  ? SHOULDER OPEN ROTATOR CUFF REPAIR Right 03/30/2014  ? Procedure: ROTATOR CUFF REPAIR SHOULDER OPEN;  Surgeon: Kerin Salen, MD;  Location: [place];  Service: Orthopedics;  Laterality: Right;  ? TEE WITHOUT CARDIOVERSION N/A 01/15/2019  ? Procedure: TRANSESOPHAGEAL ECHOCARDIOGRAM (TEE);  Surgeon: Sanda Klein, MD;  Location: [place];  Service: Cardiovascular;  Laterality: N/A;  ? TONSILLECTOMY  child  ? TOTAL KNEE ARTHROPLASTY Left 2006  ? ? ?SOCIAL HISTORY: ?Social History  ? ?Socioeconomic History  ? Marital status: Single  ?  Spouse name: Not on file  ? Number of children: 1  ? Years of education: 39  ? Highest education level: Associate degree: academic program   ?Occupational History  ? Occupation: pilot-retired  ?Tobacco Use  ? Smoking status: Never  ? Smokeless tobacco: Never  ?Vaping Use  ? Vaping Use: Never used  ?Substance and Sexual Activity  ? Alcohol use: Not Currently  ?

## 2021-05-10 NOTE — Telephone Encounter (Addendum)
Left message for him as per Dr. Claris Gladden instructions and asked him to call the office.  Also have sent a my chart message as well. ? ?----- Message from Larey Dresser, MD sent at 05/10/2021  1:45 PM EDT ----- ?Please check with him to make sure he is taking Plavix 75 mg daily.  May have stopped for his bone marrow biopsy? ?

## 2021-05-10 NOTE — Addendum Note (Signed)
Encounter addended by: Jerl Mina, RN on: 05/10/2021 1:58 PM ? Actions taken: Pharmacy for encounter modified, Order list changed

## 2021-05-11 ENCOUNTER — Encounter (HOSPITAL_COMMUNITY): Payer: Self-pay | Admitting: Hematology

## 2021-05-12 ENCOUNTER — Telehealth: Payer: Self-pay | Admitting: Hematology

## 2021-05-12 NOTE — Telephone Encounter (Signed)
Left message with follow-up appointments per 4/4 los. ?

## 2021-05-16 ENCOUNTER — Encounter: Payer: Self-pay | Admitting: Cardiovascular Disease

## 2021-05-16 ENCOUNTER — Encounter (HOSPITAL_COMMUNITY): Payer: Self-pay | Admitting: Hematology

## 2021-05-16 ENCOUNTER — Ambulatory Visit (INDEPENDENT_AMBULATORY_CARE_PROVIDER_SITE_OTHER): Payer: Medicare HMO | Admitting: Cardiovascular Disease

## 2021-05-16 VITALS — BP 120/60 | HR 64 | Ht 69.5 in | Wt 207.2 lb

## 2021-05-16 DIAGNOSIS — E7800 Pure hypercholesterolemia, unspecified: Secondary | ICD-10-CM

## 2021-05-16 DIAGNOSIS — T466X5D Adverse effect of antihyperlipidemic and antiarteriosclerotic drugs, subsequent encounter: Secondary | ICD-10-CM

## 2021-05-16 DIAGNOSIS — G720 Drug-induced myopathy: Secondary | ICD-10-CM

## 2021-05-16 DIAGNOSIS — E669 Obesity, unspecified: Secondary | ICD-10-CM

## 2021-05-16 DIAGNOSIS — I495 Sick sinus syndrome: Secondary | ICD-10-CM

## 2021-05-16 DIAGNOSIS — I25708 Atherosclerosis of coronary artery bypass graft(s), unspecified, with other forms of angina pectoris: Secondary | ICD-10-CM | POA: Diagnosis not present

## 2021-05-16 DIAGNOSIS — I442 Atrioventricular block, complete: Secondary | ICD-10-CM

## 2021-05-16 DIAGNOSIS — I5032 Chronic diastolic (congestive) heart failure: Secondary | ICD-10-CM | POA: Diagnosis not present

## 2021-05-16 DIAGNOSIS — I480 Paroxysmal atrial fibrillation: Secondary | ICD-10-CM | POA: Diagnosis not present

## 2021-05-16 DIAGNOSIS — Z950 Presence of cardiac pacemaker: Secondary | ICD-10-CM

## 2021-05-16 DIAGNOSIS — Z794 Long term (current) use of insulin: Secondary | ICD-10-CM

## 2021-05-16 DIAGNOSIS — I4729 Other ventricular tachycardia: Secondary | ICD-10-CM

## 2021-05-16 DIAGNOSIS — I10 Essential (primary) hypertension: Secondary | ICD-10-CM

## 2021-05-16 DIAGNOSIS — N184 Chronic kidney disease, stage 4 (severe): Secondary | ICD-10-CM

## 2021-05-16 DIAGNOSIS — E1122 Type 2 diabetes mellitus with diabetic chronic kidney disease: Secondary | ICD-10-CM

## 2021-05-16 DIAGNOSIS — I453 Trifascicular block: Secondary | ICD-10-CM

## 2021-05-16 MED ORDER — TORSEMIDE 10 MG PO TABS: 10.0000 mg | ORAL_TABLET | Freq: Every day | ORAL | 1 refills | Status: DC

## 2021-05-16 NOTE — Progress Notes (Signed)
? ?Cardiology Office Note   ? ?Date:  05/16/2021  ? ?ID:  KIETH [name], DOB 27-Apr-1940, MRN [id number] ? ?PCP:  [name], MD  ?Cardiologist:  Sanda Klein, MD  ? ?No chief complaint on file. ? ? ?History of Present Illness:  ?Alan [name] is a 81 y.o. male with coronary artery disease, previous CABG, sinus node arrest, status post dual-chamber permanent pacemaker ("atrially dependent"), history of atrial fibrillation status post remote ablation in [facility], left atrial appendage clipping (with a residual permeable lobe of the left atrial appendage), type 2 diabetes mellitus and dyslipidemia, obstructive sleep apnea on CPAP, here for follow up. ? ?He feels extremely weak.  He is dizzy when he stands up.  His voice is weak.  He feels thirsty. ? ?Last week on April 5 he had labs checked by Dr. Melford Aase and his creatinine was 2.23 and potassium was 5.5.  His losartan was discontinued, but his torsemide dose was left unchanged.  He was asked to have repeat labs in another couple of weeks. ? ?Today on his home scale he weighed only 202 pounds.  On our office scale he weighs 207 pounds.  This is a 7 pound decrease in just the last week, since his appointment with Dr. Aundra Dubin.  It is about 9 pounds less than he weighed when I thought he was probably euvolemic (216 pounds on our office scale, BNP 102, symptoms well controlled). ? ?He has continued taking the same dose of torsemide 10 mg alternating with 20 mg.  He has been taking Jardiance.  Neither 1 of these has changed recently.  He is enrolled in a weight loss program through [email or web address], which includes proprietary nutraceuticals, administered in the form of drops.  The content of these is uncertain.  He has not been exercising. ? ?He was hospitalized with an episode of diastolic heart failure exacerbation in December 2022 when his weight was as high as 231 pounds.  Since then, his weight has been in the 208-215 range on office  scales (his home scale usually shows about 4 pounds or less).  This is the lowest he has weighed since we have been reporting his vital signs in the electronic medical record. ? ?His current creatinine of around 2.2 has been fairly stable and probably represents his new baseline with a GFR around 25.  Glycemic control is good with a hemoglobin A1c that was 6.1% about 3 months ago.  Lipid profile performed in February showed an LDL of 88 and HDL of 41.  Triglycerides were marginally increased at 194.  LDL has been hovering in this range for the last couple of years.  He does not want to take PCSK9 inhibitors due to cost and is intolerant of statins due to myopathy. ? ?Pacemaker interrogation does not show any recent atrial fibrillation (none since his last visit in February).  He has 100% atrial pacing and 20% ventricular pacing. par He has not had any high ventricular rates.  His pacemaker is not MRI conditional. ? ?He underwent a pyrophosphate scan that showed no evidence of amyloidosis.  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  He has an appointment scheduled in the hematology clinic with Dr. Irene Limbo on 04/04/2021.  He has been referred to [facility]. ? ? ?He felt poorly while in atypical atrial flutter/atrial fibrillation in September-December 2020 (the atrial arrhythmia was accompanied by a marked increase in  ventricular pacing, which he does not tolerate well when we test his device in the office).  On 01/15/2019, he underwent TEE that showed that although his left atrial appendage was surgically clipped, there did appear to be a permeable lobe (without thrombus).  Atrial overdrive pacing via the dual-chamber pacemaker was unsuccessful.  He subsequently underwent cardioversion successfully.  This led to improvement in symptoms.  He took warfarin for a while after his cardioversion, but has stopped it since then.  He does  not know why.  He could not afford direct oral anticoagulant such as Eliquis. ? ?She had bilateral carpal tunnel surgery with Dr. Amedeo Plenty bilaterally.  He complains of a weaker grasp in his right hand and the fact that his fourth finger seems to lag behind the others.  I recommended that he discuss this again with Dr. Amedeo Plenty. ? ?Cardiac catheterization in November 2020 showed unchanged anatomy (the native LAD and RCA arteries are chronically occluded, but have good arterial supply via the LIMA and SVG bypasses, respectively, both appearing free of disease.  There was a 99% stenosis in the distal left circumflex coronary artery, too small for PCI.  The LVEF was 50-55%, but the LVEDP was elevated at 18 mmHg. ?  ?He has a longstanding history of cardiac problems. He had CABG in 1990 (sequential LIMA to LAD and diagonal), multiple RCA PCI 1990-2004 and a redo CABG in 2004 (SVG-RCA-PDA, with surgical MAZE procedure, Dr. Amador Cunas). Cath 2007 and 2012 show occluded LAD and RCA with patent grafts, 60% ostial OM and diffuse stenoses distal LCX. He has normal left ventricular systolic function with an EF of 55-60% by the echo performed on 06/25/2013   ?He received a pacemaker in 2008 for CHB, but now has sinus node arrest with intact AV conduction. He had a generator changeout and ventricular lead revision in January 2014. He does not tolerate VVI pacing. He has had occasional nonsustained VT recorded by the device, always asymptomatic. He has had paroxysmal atrial fibrillation in the remote past. No history of stroke or TIA. Warfarin was stopped for recurrent GI bleeding requiring transfusions. Despite stopping warfarin anticoagulation, he had another episode of GI bleeding requiring transfusion in April 2015.  Although he had left atrial appendage clipping at the time of his bypass surgery, he has a permeable lobe of the left atrial appendage.  He cannot undergo a Watchman procedure.   ?He wears CPAP for OSA, has insulin  requiring type II DM, mild dyslipidemia intolerant to all statins, post-polio syndrome, lumbar spine surgery 01/2012.   ?He had poorly tolerated recurrent persistent atrial fibrillation in late 2020 and underwent cardioversion followed by treatment with amiodarone.  He has not had any meaningful recurrent atrial fibrillation since. ?He does not trust nuclear stress tests - reports they were repeatedly wrong in the past. [name] told him he should always have a cath rather than a stress test.   ?  ? ?Past Medical History:  ?Diagnosis Date  ? Anticoagulant long-term use   ? Arthritis   ? Arthropathy of left shoulder   ? Atypical mole   ? Biceps tendonitis, left   ? Bursitis of left shoulder   ? Cardiac pacemaker in situ   ? first insertion 2008;  genertor and new lead change 03-08-2012;   [employer]tronic  ? CHB (complete heart block) ([place])   ? s/p  PPM 2008  ? Chronic back pain   ? CKD (chronic kidney disease), stage III ([place])   ? Coronary  artery disease cardiologist--- dr Perian Tedder  ? hx  CABG x2  1990 and re-do 2004;  multiple PCI to [place] to 2004;    cardiac cath 2012  occluded LAD and RCA with patent grafts  ? Difficult intubation   ? " Dr. Sallyanne Kuster said that it was difficulty to get the tube in."  ? History of coronary angioplasty   ? multiple PCI to RCA ,  1990 to 2004  ? History of GI bleed followed by GI-- Ole[facility]erches [facility] [place]  ? 01/ 2020  upper and lower GI bleed ;  s/p  EGD with cautery of jejunal bleed and blood transfusion's  ? History of kidney stones   ? Hx of echocardiogram 06/29/2008  ? EF 45-50%   ? Hypertension   ? Ischemic cardiomyopathy   ? previously reported , ef 40-45%;  2010--- ef 45-50%;   last echo 2017 55-60%  ? Mixed hyperlipidemia   ? Nocturia   ? OSA on CPAP   ? cpap, 12, last sleep study Jan2013, sees Dr. Keturah Barre  ? PAF (paroxysmal atrial fibrillation) ([place])   ? long hx PAF--- followed by dr Vika Buske  ? Post-polio syndrome   ? dx polio age 21;   09-30-2018   per pt occasional gait issues and occasion fall  ? Presence of permanent cardiac pacemaker   ? Pseudoarthrosis of lumbar spine   ? Rotator cuff tear, left   ? S/P CABG x 2   ? 1990 x2  and 2004  x2  ? S

## 2021-05-16 NOTE — Patient Instructions (Signed)
Medication Instructions:  ?STOP the Torsemide until your weight has reached 210 lbs on your home scale, then resume at 10 mg once daily ? ?*If you need a refill on your cardiac medications before your next appointment, please call your pharmacy* ? ? ?Lab Work: ?None ordered ?If you have labs (blood work) drawn today and your tests are completely normal, you will receive your results only by: ?MyChart Message (if you have MyChart) OR ?A paper copy in the mail ?If you have any lab test that is abnormal or we need to change your treatment, we will call you to review the results. ? ? ?Testing/Procedures: ?None ordered ? ? ?Follow-Up: ?At [facility], you and your health needs are our priority.  As part of our continuing mission to provide you with exceptional heart care, we have created designated Provider Care Teams.  These Care Teams include your primary Cardiologist (physician) and Advanced Practice Providers (APPs -  Physician Assistants and Nurse Practitioners) who all work together to provide you with the care you need, when you need it. ? ?We recommend signing up for the patient portal called "MyChart".  Sign up information is provided on this After Visit Summary.  MyChart is used to connect with patients for Virtual Visits (Telemedicine).  Patients are able to view lab/test results, encounter notes, upcoming appointments, etc.  Non-urgent messages can be sent to your provider as well.   ?To learn more about what you can do with MyChart, go to [email or web address].   ? ?Your next appointment:   ?Follow up in May on a device day ? ?

## 2021-05-23 LAB — SURGICAL PATHOLOGY

## 2021-05-24 ENCOUNTER — Other Ambulatory Visit: Payer: Self-pay | Admitting: Cardiology

## 2021-05-26 NOTE — Progress Notes (Signed)
Patient with monoclonal paraproteinemia to rule out myeloma needs ?Bone marrow biopsy with [employer] to evaluate for multiple myeloma. ?                                                                   ?Sullivan Lone MD MS ?Hematology/Oncology Physician ?[facility]                               ?

## 2021-05-30 ENCOUNTER — Encounter: Payer: Self-pay | Admitting: Internal Medicine

## 2021-05-30 NOTE — Assessment & Plan Note (Signed)
Followed by cardiology. Pacemaker.  ?

## 2021-05-30 NOTE — Assessment & Plan Note (Signed)
Benefits from CPAP.  ?Plan- activate new machine, set on 12 ?

## 2021-06-06 ENCOUNTER — Other Ambulatory Visit: Payer: Self-pay | Admitting: Cardiovascular Disease

## 2021-06-13 ENCOUNTER — Ambulatory Visit: Payer: Medicare HMO | Admitting: Cardiovascular Disease

## 2021-06-14 ENCOUNTER — Encounter: Payer: Self-pay | Admitting: Physician Assistant

## 2021-06-14 ENCOUNTER — Ambulatory Visit: Payer: Medicare HMO | Admitting: Physician Assistant

## 2021-06-14 DIAGNOSIS — L570 Actinic keratosis: Secondary | ICD-10-CM

## 2021-06-14 DIAGNOSIS — Z86018 Personal history of other benign neoplasm: Secondary | ICD-10-CM

## 2021-06-14 DIAGNOSIS — D485 Neoplasm of uncertain behavior of skin: Secondary | ICD-10-CM

## 2021-06-14 DIAGNOSIS — D044 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: Secondary | ICD-10-CM | POA: Diagnosis not present

## 2021-06-14 DIAGNOSIS — Z85828 Personal history of other malignant neoplasm of skin: Secondary | ICD-10-CM

## 2021-06-14 DIAGNOSIS — C4492 Squamous cell carcinoma of skin, unspecified: Secondary | ICD-10-CM

## 2021-06-14 DIAGNOSIS — Z1283 Encounter for screening for malignant neoplasm of skin: Secondary | ICD-10-CM

## 2021-06-14 HISTORY — DX: Squamous cell carcinoma of skin, unspecified: C44.92

## 2021-06-14 NOTE — Patient Instructions (Signed)

## 2021-06-15 ENCOUNTER — Telehealth: Payer: Self-pay

## 2021-06-15 ENCOUNTER — Encounter: Payer: Self-pay | Admitting: Physician Assistant

## 2021-06-15 NOTE — Telephone Encounter (Signed)
Phone call to patient with his pathology results. Patient aware of results.  

## 2021-06-15 NOTE — Progress Notes (Addendum)
? ?  Follow-Up Visit ?  ?Subjective  ?Alan [name] is a 81 y.o. male who presents for the following: Annual Exam (No new concerns- Personal history of non mole skin cancer & atypical nevi, but no melanoma. No family history of melanoma. ). ? ? ?The following portions of the chart were reviewed this encounter and updated as appropriate:  Tobacco  Allergies  Meds  Problems  Med Hx  Surg Hx  Fam Hx   ?  ? ?Objective  ?Well appearing patient in no apparent distress; mood and affect are within normal limits. ? ?All skin waist up examined. ? ?Left Cymba, Left Postauricular Sulcus, Mid Frontal Scalp (2), Right Mid Helix (3) ?Erythematous patches with gritty scale. ? ?Mid Parietal Scalp ?Hyperkeratotic scale with pink base  ? ? ? ? ? ? ? ?Assessment & Plan  ?AK (actinic keratosis) (7) ?Right Mid Helix (3); Left Cymba; Left Postauricular Sulcus; Mid Frontal Scalp (2) ? ?[place] this winter ? ?Destruction of lesion - Left Cymba, Left Postauricular Sulcus, Mid Frontal Scalp, Right Mid Helix ?Complexity: simple   ?Destruction method: cryotherapy   ?Informed consent: discussed and consent obtained   ?Timeout:  patient name, date of birth, surgical site, and procedure verified ?Lesion destroyed using liquid nitrogen: Yes   ?Cryotherapy cycles:  3 ?Outcome: patient tolerated procedure well with no complications   ? 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 ?Skin / nail biopsy ?Type of biopsy: tangential   ?Informed consent: discussed and consent obtained   ?Timeout: patient name, date of birth, surgical site, and procedure verified   ?Procedure prep:  Patient was prepped and draped in usual sterile fashion (Non sterile) ?Prep type:  Chlorhexidine ?Anesthesia: the lesion was anesthetized in a standard fashion   ?Anesthetic:  1% lidocaine w/ epinephrine 1-100,000 local infiltration ?Instrument used: flexible razor blade   ?Outcome: patient tolerated procedure well   ?Post-procedure details: wound care  instructions given   ? ?Destruction of lesion ?Complexity: simple   ?Destruction method: electrodesiccation and curettage   ?Informed consent: discussed and consent obtained   ?Timeout:  patient name, date of birth, surgical site, and procedure verified ?Anesthesia: the lesion was anesthetized in a standard fashion   ?Anesthetic:  1% lidocaine w/ epinephrine 1-100,000 local infiltration ?Curettage performed in three different directions: Yes   ?Electrodesiccation performed over the curetted area: Yes   ?Curettage cycles:  3 ?Margin per side (cm):  0.1 ?Final wound size (cm):  1.5 ?Hemostasis achieved with:  aluminum chloride ?Outcome: patient tolerated procedure well with no complications   ?Post-procedure details: wound care instructions given   ? ?Specimen 1 - Surgical pathology ?Differential Diagnosis: bcc vs [id number] ? ?Check Margins: yes ? ? ? ?I, Alan Farrugia, PA-C, have reviewed all documentation's for this visit.  The documentation on 06/15/21 for the exam, diagnosis, procedures and orders are all accurate and complete. ?

## 2021-06-15 NOTE — Telephone Encounter (Signed)
-----   Message from Warren Danes, [place] sent at 06/15/2021  3:43 PM EDT ----- ?Tx with biopsy. Rtc if recurs ?

## 2021-06-17 ENCOUNTER — Encounter: Payer: Self-pay | Admitting: Cardiovascular Disease

## 2021-06-20 NOTE — Telephone Encounter (Signed)
Received fax from [facility], pt is sch to see Dr Royce Macadamia on 5/22 at 11 am ?

## 2021-06-28 ENCOUNTER — Other Ambulatory Visit: Payer: Self-pay | Admitting: Nephrology

## 2021-06-28 ENCOUNTER — Other Ambulatory Visit (HOSPITAL_COMMUNITY): Payer: Self-pay | Admitting: Nephrology

## 2021-06-28 DIAGNOSIS — E1122 Type 2 diabetes mellitus with diabetic chronic kidney disease: Secondary | ICD-10-CM

## 2021-06-28 DIAGNOSIS — N184 Chronic kidney disease, stage 4 (severe): Secondary | ICD-10-CM

## 2021-07-05 ENCOUNTER — Ambulatory Visit (INDEPENDENT_AMBULATORY_CARE_PROVIDER_SITE_OTHER): Payer: Medicare HMO

## 2021-07-05 DIAGNOSIS — I495 Sick sinus syndrome: Secondary | ICD-10-CM | POA: Diagnosis not present

## 2021-07-05 LAB — CUP PACEART REMOTE DEVICE CHECK
Battery Impedance: 1043 Ohm
Battery Remaining Longevity: 59 mo
Battery Voltage: 2.77 V
Brady Statistic AP VP Percent: 17 %
Brady Statistic AP VS Percent: 83 %
Brady Statistic AS VP Percent: 0 %
Brady Statistic AS VS Percent: 0 %
Date Time Interrogation Session: 20230530084836
Implantable Lead Implant Date: 20080208
Implantable Lead Implant Date: 20140130
Implantable Lead Location: 753859
Implantable Lead Location: 753860
Implantable Lead Model: 5076
Implantable Lead Model: 5076
Implantable Pulse Generator Implant Date: 20140130
Lead Channel Impedance Value: 518 Ohm
Lead Channel Impedance Value: 598 Ohm
Lead Channel Pacing Threshold Amplitude: 0.75 V
Lead Channel Pacing Threshold Amplitude: 0.75 V
Lead Channel Pacing Threshold Pulse Width: 0.4 ms
Lead Channel Pacing Threshold Pulse Width: 0.4 ms
Lead Channel Setting Pacing Amplitude: 2 V
Lead Channel Setting Pacing Amplitude: 2.5 V
Lead Channel Setting Pacing Pulse Width: 0.4 ms
Lead Channel Setting Sensing Sensitivity: 2.8 mV

## 2021-07-08 ENCOUNTER — Ambulatory Visit (HOSPITAL_BASED_OUTPATIENT_CLINIC_OR_DEPARTMENT_OTHER)
Admission: RE | Admit: 2021-07-08 | Discharge: 2021-07-08 | Disposition: A | Discharge status: Home / Self Care | Payer: Medicare HMO | Source: Ambulatory Visit | Attending: Nephrology | Admitting: Nephrology

## 2021-07-08 ENCOUNTER — Other Ambulatory Visit: Payer: Self-pay | Admitting: *Deleted

## 2021-07-08 DIAGNOSIS — N184 Chronic kidney disease, stage 4 (severe): Secondary | ICD-10-CM | POA: Diagnosis not present

## 2021-07-08 DIAGNOSIS — E1122 Type 2 diabetes mellitus with diabetic chronic kidney disease: Secondary | ICD-10-CM | POA: Diagnosis present

## 2021-07-08 DIAGNOSIS — D472 Monoclonal gammopathy: Secondary | ICD-10-CM

## 2021-07-11 ENCOUNTER — Inpatient Hospital Stay: Payer: Medicare HMO | Attending: Hematology

## 2021-07-11 ENCOUNTER — Other Ambulatory Visit: Payer: Self-pay

## 2021-07-11 ENCOUNTER — Encounter (HOSPITAL_COMMUNITY)
Admission: RE | Admit: 2021-07-11 | Discharge: 2021-07-11 | Disposition: A | Discharge status: Home / Self Care | Payer: Medicare HMO | Source: Ambulatory Visit | Attending: Hematology | Admitting: Hematology

## 2021-07-11 DIAGNOSIS — D472 Monoclonal gammopathy: Secondary | ICD-10-CM | POA: Insufficient documentation

## 2021-07-11 LAB — CBC WITH DIFFERENTIAL (CANCER CENTER ONLY)
Abs Immature Granulocytes: 0.04 10*3/uL (ref 0.00–0.07)
Basophils Absolute: 0.1 10*3/uL (ref 0.0–0.1)
Basophils Relative: 1 %
Eosinophils Absolute: 0.2 10*3/uL (ref 0.0–0.5)
Eosinophils Relative: 3 %
HCT: 38.2 % — ABNORMAL LOW (ref 39.0–52.0)
Hemoglobin: 12.7 g/dL — ABNORMAL LOW (ref 13.0–17.0)
Immature Granulocytes: 1 %
Lymphocytes Relative: 14 %
Lymphs Abs: 0.8 10*3/uL (ref 0.7–4.0)
MCH: 31.9 pg (ref 26.0–34.0)
MCHC: 33.2 g/dL (ref 30.0–36.0)
MCV: 96 fL (ref 80.0–100.0)
Monocytes Absolute: 0.5 10*3/uL (ref 0.1–1.0)
Monocytes Relative: 9 %
Neutro Abs: 4 10*3/uL (ref 1.7–7.7)
Neutrophils Relative %: 72 %
Platelet Count: 100 10*3/uL — ABNORMAL LOW (ref 150–400)
RBC: 3.98 MIL/uL — ABNORMAL LOW (ref 4.22–5.81)
RDW: 12.9 % (ref 11.5–15.5)
WBC Count: 5.6 10*3/uL (ref 4.0–10.5)
nRBC: 0 % (ref 0.0–0.2)

## 2021-07-11 LAB — CMP (CANCER CENTER ONLY)
ALT: 22 U/L (ref 0–44)
AST: 20 U/L (ref 15–41)
Albumin: 3.9 g/dL (ref 3.5–5.0)
Alkaline Phosphatase: 96 U/L (ref 38–126)
Anion gap: 6 (ref 5–15)
BUN: 31 mg/dL — ABNORMAL HIGH (ref 8–23)
CO2: 27 mmol/L (ref 22–32)
Calcium: 9.7 mg/dL (ref 8.9–10.3)
Chloride: 105 mmol/L (ref 98–111)
Creatinine: 1.76 mg/dL — ABNORMAL HIGH (ref 0.61–1.24)
GFR, Estimated: 39 mL/min — ABNORMAL LOW (ref >60)
Glucose, Bld: 129 mg/dL — ABNORMAL HIGH (ref 70–99)
Potassium: 4.4 mmol/L (ref 3.5–5.1)
Sodium: 138 mmol/L (ref 135–145)
Total Bilirubin: 0.6 mg/dL (ref 0.3–1.2)
Total Protein: 8 g/dL (ref 6.5–8.1)

## 2021-07-11 LAB — GLUCOSE, CAPILLARY: Glucose-Capillary: 128 mg/dL — ABNORMAL HIGH (ref 70–99)

## 2021-07-11 LAB — LACTATE DEHYDROGENASE: LDH: 134 U/L (ref 98–192)

## 2021-07-11 LAB — SEDIMENTATION RATE: Sed Rate: 48 mm/hr — ABNORMAL HIGH (ref 0–16)

## 2021-07-11 MED ORDER — FLUDEOXYGLUCOSE F - 18 (FDG) INJECTION
10.3000 | Freq: Once | INTRAVENOUS | Status: AC
  Administered 2021-07-11: 9.84 via INTRAVENOUS

## 2021-07-12 ENCOUNTER — Other Ambulatory Visit: Payer: Medicare HMO

## 2021-07-12 LAB — KAPPA/LAMBDA LIGHT CHAINS
Kappa free light chain: 45.3 mg/L — ABNORMAL HIGH (ref 3.3–19.4)
Kappa, lambda light chain ratio: 2 — ABNORMAL HIGH (ref 0.26–1.65)
Lambda free light chains: 22.6 mg/L (ref 5.7–26.3)

## 2021-07-14 ENCOUNTER — Other Ambulatory Visit: Payer: Self-pay | Admitting: Cardiovascular Disease

## 2021-07-14 ENCOUNTER — Other Ambulatory Visit (HOSPITAL_COMMUNITY): Payer: Self-pay | Admitting: Cardiology

## 2021-07-15 LAB — MULTIPLE MYELOMA PANEL, SERUM
Albumin SerPl Elph-Mcnc: 3.4 g/dL (ref 2.9–4.4)
Albumin/Glob SerPl: 0.9 (ref 0.7–1.7)
Alpha 1: 0.2 g/dL (ref 0.0–0.4)
Alpha2 Glob SerPl Elph-Mcnc: 0.9 g/dL (ref 0.4–1.0)
B-Globulin SerPl Elph-Mcnc: 2.7 g/dL — ABNORMAL HIGH (ref 0.7–1.3)
Gamma Glob SerPl Elph-Mcnc: 0.3 g/dL — ABNORMAL LOW (ref 0.4–1.8)
Globulin, Total: 4.2 g/dL — ABNORMAL HIGH (ref 2.2–3.9)
IgA: 140 mg/dL (ref 61–437)
IgG (Immunoglobin G), Serum: 2143 mg/dL — ABNORMAL HIGH (ref 603–1613)
IgM (Immunoglobulin M), Srm: 19 mg/dL (ref 15–143)
M Protein SerPl Elph-Mcnc: 1.7 g/dL — ABNORMAL HIGH
Total Protein ELP: 7.6 g/dL (ref 6.0–8.5)

## 2021-07-19 ENCOUNTER — Other Ambulatory Visit: Payer: Self-pay

## 2021-07-19 ENCOUNTER — Inpatient Hospital Stay: Payer: Medicare HMO | Admitting: Hematology

## 2021-07-19 VITALS — BP 147/84 | HR 97 | Temp 98.1°F | Resp 18 | Wt 207.4 lb

## 2021-07-19 DIAGNOSIS — D472 Monoclonal gammopathy: Secondary | ICD-10-CM

## 2021-07-20 ENCOUNTER — Telehealth: Payer: Self-pay | Admitting: Hematology

## 2021-07-20 NOTE — Progress Notes (Signed)
Remote pacemaker transmission.   

## 2021-07-20 NOTE — Telephone Encounter (Signed)
Left message with follow-up appointments per 6/13 los.

## 2021-07-26 ENCOUNTER — Other Ambulatory Visit (HOSPITAL_COMMUNITY): Payer: Self-pay | Admitting: Cardiology

## 2021-07-26 NOTE — Progress Notes (Signed)
Alan [name] Kitchen   HEMATOLOGY/ONCOLOGY CLINIC NOTE  Date of Service: 07/26/2021  Patient Care Team: [name], MD as PCP - General (Family Medicine) [name], Dani Gobble, MD as PCP - Cardiology (Cardiology) [name], Dani Gobble, MD as Consulting Physician (Cardiology) Lavonna Monarch, MD as Consulting Physician (Dermatology) Warren Danes, PA-C as Physician Assistant (Dermatology)  CHIEF COMPLAINTS/PURPOSE OF CONSULTATION:  Follow-up for evaluation and management of plasma cell dyscrasia and monoclonal paraproteinemia  HISTORY OF PRESENTING ILLNESS:  See previous note for details of initial presentation.  INTERVAL HISTORY: Patient is here for continued valuation and management of her smoldering myeloma. He notes no acute new symptoms since his last clinic visit.  He is here with his caregiver for this visit. He notes no fevers no chills no night sweats no unexpected weight loss.  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 Labs done on 07/11/2021 were discussed in detail with the patient.  MEDICAL HISTORY:  Past Medical History:  Diagnosis Date   Anticoagulant long-term use    Arthritis    Arthropathy of left shoulder    Atypical mole    Biceps tendonitis, left    Bursitis of left shoulder    Cardiac pacemaker in situ    first insertion 2008;  genertor and new lead change 03-08-2012;   [employer]   CHB (complete heart block) ([place])    s/p  PPM 2008   Chronic back pain    CKD (chronic kidney disease), stage III ([place])    Coronary artery disease cardiologist--- dr croitoru   hx  CABG x2  1990 and re-do 2004;  multiple PCI to [place] to 2004;    cardiac cath 2012  occluded LAD and RCA with patent grafts   Difficult intubation    " Dr. Sallyanne Kuster said that it was difficulty to get the tube in."   History of coronary angioplasty    multiple PCI to RCA ,  1990 to 2004   History of GI bleed followed by GI-- Olevia Perches PA @ [facility] in  [place]   01/ 2020  upper and lower GI bleed ;  s/p  EGD with cautery of jejunal bleed and blood transfusion's   History of kidney stones    Hx of echocardiogram 06/29/2008   EF 45-50%    Hypertension    Ischemic cardiomyopathy    previously reported , ef 40-45%;  2010--- ef 45-50%;   last echo 2017 55-60%   Mixed hyperlipidemia    Nocturia    OSA on CPAP    cpap, 12, last sleep study Jan2013, sees Dr. Keturah Barre   PAF (paroxysmal atrial fibrillation) ([place])    long hx PAF--- followed by dr croitoru   Post-polio syndrome    dx polio age 62;   09-30-2018  per pt occasional gait issues and occasion fall   Presence of permanent cardiac pacemaker    Pseudoarthrosis of lumbar spine    Rotator cuff tear, left    S/P CABG x 2    1990 x2  and 2004  x2   Sinus node dysfunction ([place])    2015--- sinus node arrest  with intact AV conduction   Squamous cell carcinoma of skin 06/14/2021   Mid Parietal Scalp (in situ) (tx p bx)   Type 2 diabetes mellitus ([place])    followed by pcp    SURGICAL HISTORY: Past Surgical History:  Procedure Laterality Date   APPENDECTOMY  child   APPLICATION OF  INTRAOPERATIVE CT SCAN N/A 07/20/2020   Procedure: APPLICATION OF INTRAOPERATIVE CT SCAN;  Surgeon: [name], Theodoro Doing, DO;  Location: [facility];  Service: Neurosurgery;  Laterality: N/A;   BLEPHAROPLASTY Bilateral 12/2014   upper eyelid   CARDIAC PACEMAKER PLACEMENT  2008   CARDIOVERSION N/A 01/15/2019   Procedure: CARDIOVERSION;  Surgeon: Sanda Klein, MD;  Location: [facility]OSCOPY;  Service: Cardiovascular;  Laterality: N/A;   CARPAL TUNNEL RELEASE Bilateral 2015;  2016   CATARACT EXTRACTION W/ INTRAOCULAR LENS  IMPLANT, BILATERAL  2017   CORONARY ANGIOPLASTY  1990  to 2004   multiple to [place]    @ [place] (dr chitwood)   seq. LIMA to LAD and Diagonal   CORONARY ARTERY BYPASS GRAFT  2004     dr Amador Cunas   SVG to  RCA and PDA;  ALSO MAZE  PROCEDURE    ESOPHAGOGASTRODUODENOSCOPY Left 03/18/2013   Procedure: ESOPHAGOGASTRODUODENOSCOPY (EGD);  Surgeon: Cleotis Nipper, MD;  Location: [facility] ENDOSCOPY;  Service: Endoscopy;  Laterality: Left;   HARDWARE REMOVAL N/A 05/18/2016   Procedure: Removal of broken hardware Lumbar three-four;  Surgeon: [name], MD;  Location: [facility];  Service: Neurosurgery;  Laterality: N/A;   HERNIA REPAIR     LEFT HEART CATH AND CORS/GRAFTS ANGIOGRAPHY N/A 12/18/2018   Procedure: LEFT HEART CATH AND CORS/GRAFTS ANGIOGRAPHY;  Surgeon: Wellington Hampshire, MD;  Location: [facility] LAB;  Service: Cardiovascular;  Laterality: N/A;   LEFT HEART CATHETERIZATION WITH CORONARY/GRAFT ANGIOGRAM N/A 12/21/2010   Procedure: LEFT HEART CATHETERIZATION WITH Beatrix Fetters;  Surgeon: Leonie Man, MD;  Location: [facility] CATH LAB;  Service: Cardiovascular;  Laterality: N/A;   LEG SURGERY Right 2002  approx.   lengthened his leg   LUMBAR LAMINECTOMY/DECOMPRESSION MICRODISCECTOMY  01/10/2012   Procedure: LUMBAR LAMINECTOMY/DECOMPRESSION MICRODISCECTOMY 1 LEVEL;  Surgeon: [name], MD;  Location: [place] NEURO ORS;  Service: Neurosurgery;  Laterality: Bilateral;  Lumbar three-four decompression, Posterior lateral fusion lumbar three-four, posterior spinus plate lumbar three-four   LUMBAR LAMINECTOMY/DECOMPRESSION MICRODISCECTOMY Left 05/18/2016   Procedure: Laminectomy and Foraminotomy - Lumbar two-lumbar three- Lumbar four-lumbar five- left with removal hardware lumbar three-four;  Surgeon: [name], MD;  Location: [place];  Service: Neurosurgery;  Laterality: Left;   NASAL SEPTUM SURGERY  yrs ago   PACEMAKER REVISION N/A 03/07/2012   Procedure: PACEMAKER REVISION;  Surgeon: Sanda Klein, MD;  Location: [place] [facility] CATH LAB;  Service: Cardiovascular;  Laterality: N/A;   POSTERIOR LUMBAR FUSION  02-22-2017   dr Ronnald Ramp  @ [facility]   re-do laminectomy L2-3 and fusion   POSTERIOR LUMBAR FUSION  03-11-2018   dr Ronnald Ramp @ [facility]   laminectomy L4-5;   fixation L2-S1 and fusion L3-S1   SACROILIAC JOINT FUSION N/A 07/20/2020   Procedure: Sacroiliac Joint Fusion;  Surgeon: Karsten Ro, DO;  Location: [facility];  Service: Neurosurgery;  Laterality: N/A;   SHOULDER ARTHROSCOPY Right after 2016, pt unsure year   SHOULDER ARTHROSCOPY WITH OPEN ROTATOR CUFF REPAIR Right 03/30/2014   Procedure: RIGHT SHOULDER ARTHROSCOPY  OPEN ROTATOR CUFF REPAIR;  Surgeon: Kerin Salen, MD;  Location: [facility];  Service: Orthopedics;  Laterality: Right;   SHOULDER ARTHROSCOPY WITH SUBACROMIAL DECOMPRESSION, ROTATOR CUFF REPAIR AND BICEP TENDON REPAIR Left 10/01/2018   Procedure: LEFT SHOULDER ARTHROSCOPY, DISTAL CLAVICLE EXCISION,SUBACROMIAL, DECOMPRESSION, RORATOR CUFF REPAIR, BICEPS TENODESIS;  Surgeon: Renette Butters, MD;  Location: [facility];  Service: Orthopedics;  Laterality: Left;  SHOULDER OPEN ROTATOR CUFF REPAIR Right 03/30/2014   Procedure: ROTATOR CUFF REPAIR SHOULDER OPEN;  Surgeon: Kerin Salen, MD;  Location: [facility] [place];  Service: Orthopedics;  Laterality: Right;   TEE WITHOUT CARDIOVERSION N/A 01/15/2019   Procedure: TRANSESOPHAGEAL ECHOCARDIOGRAM (TEE);  Surgeon: Sanda Klein, MD;  Location: [facility] ENDOSCOPY;  Service: Cardiovascular;  Laterality: N/A;   TONSILLECTOMY  child   TOTAL KNEE ARTHROPLASTY Left 2006    SOCIAL HISTORY: Social History   Socioeconomic History   Marital status: Single    Spouse name: Not on file   Number of children: 1   Years of education: 16   Highest education level: Associate degree: academic program  Occupational History   Occupation: pilot-retired  Tobacco Use   Smoking status: Never   Smokeless tobacco: Never  Vaping Use   Vaping Use: Never used  Substance and Sexual Activity   Alcohol use: Not Currently   Drug use: Never   Sexual activity: Yes  Other Topics Concern   Not on file  Social History Narrative   Lives on [address] [employer], fishes.    Right-handed.   1 cup caffeine per  day.   Social Determinants of Health   Financial Resource Strain: Low Risk  (02/04/2021)   Overall Financial Resource Strain (CARDIA)    Difficulty of Paying Living Expenses: Not hard at all  Food Insecurity: No Food Insecurity (02/04/2021)   Hunger Vital Sign    Worried About Running Out of Food in the Last Year: Never true    Ran Out of Food in the Last Year: Never true  Transportation Needs: No Transportation Needs (02/04/2021)   PRAPARE - Hydrologist (Medical): No    Lack of Transportation (Non-Medical): No  Physical Activity: Not on file  Stress: Not on file  Social Connections: Not on file  Intimate Partner Violence: Not on file  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  Lives in [place]. Non-smoker.  Rare alcohol use.   FAMILY HISTORY: Family History  Problem Relation Age of Onset   Other Father        MVA   Cancer Mother     ALLERGIES:  has No Known Allergies.  MEDICATIONS:  Current Outpatient Medications  Medication Sig Dispense Refill   acetaminophen (TYLENOL) 500 MG tablet Take 1,000 mg by mouth every 6 (six) hours as needed for mild pain or headache.     amiodarone (PACERONE) 200 MG tablet TAKE 2 TABLETS BY MOUTH DAILY FOR 14 DAYS, THEN TAKE ONE TABLET ONCE DAILY. 45 tablet 2   carvedilol (COREG) 25 MG tablet Take 2 tablets (50 mg total) by mouth 2 (two) times daily with a meal. 120 tablet 3   clopidogrel (PLAVIX) 75 MG tablet Take 1 tablet (75 mg total) by mouth daily. 90 tablet 3   empagliflozin (JARDIANCE) 10 MG TABS tablet Take 10 mg by mouth daily.     ferrous sulfate 325 (65 FE) MG EC tablet Take 325 mg by mouth 2 (two) times daily.     glipiZIDE (GLUCOTROL) 10 MG tablet Take 10 mg by mouth 2 (two) times daily.     insulin lispro (HUMALOG) 100 UNIT/ML injection Inject 2-8 Units into the skin as needed for high blood sugar. Take as needed based on CBG value     meclizine (ANTIVERT) 25 MG tablet Take 25 mg by mouth 3 (three)  times daily as needed.     Multiple Vitamin (MULTIVITAMIN WITH  MINERALS) TABS tablet Take 1 tablet by mouth daily.      ONETOUCH ULTRA test strip USE 1 STRIP TO CHECK GLUCOSE 4 TIMES DAILY     pantoprazole (PROTONIX) 40 MG tablet Take 1 tablet (40 mg total) by mouth daily. 30 tablet 0   torsemide (DEMADEX) 10 MG tablet Take 1 tablet by mouth once daily 30 tablet 0   losartan (COZAAR) 50 MG tablet Take 1 tablet (50 mg total) by mouth daily. (Patient not taking: Reported on 07/19/2021) 30 tablet 6   metFORMIN (GLUCOPHAGE-XR) 500 MG 24 hr tablet Take 500 mg by mouth in the morning and at bedtime. (Patient not taking: Reported on 07/19/2021)     No current facility-administered medications for this visit.    REVIEW OF SYSTEMS:   10 Point review of Systems was done is negative except as noted above.  PHYSICAL EXAMINATION: ECOG PERFORMANCE STATUS: 2 - Symptomatic, <50% confined to bed  . Vitals:   07/19/21 1210  BP: (!) 147/84  Pulse: 97  Resp: 18  Temp: 98.1 F (36.7 C)  SpO2: 99%    Filed Weights   07/19/21 1210  Weight: 207 lb 6.4 oz (94.1 kg)    .Body mass index is 30.19 kg/m. NAD GENERAL:alert, in no acute distress and comfortable SKIN: no acute rashes, no significant lesions EYES: conjunctiva are pink and non-injected, sclera anicteric OROPHARYNX: MMM, no exudates, no oropharyngeal erythema or ulceration NECK: supple, no JVD LYMPH:  no palpable lymphadenopathy in the cervical, axillary or inguinal regions LUNGS: clear to auscultation b/l with normal respiratory effort HEART: regular rate & rhythm ABDOMEN:  normoactive bowel sounds , non tender, not distended. Extremity: no pedal edema PSYCH: alert & oriented x 3 with fluent speech NEURO: no focal motor/sensory deficits  LABORATORY DATA:  I have reviewed the data as listed  .    Latest Ref Rng & Units 07/11/2021    9:35 AM 05/03/2021    7:59 AM 04/04/2021   12:30 PM  CBC  WBC 4.0 - 10.5 K/uL 5.6  7.4  5.6    Hemoglobin 13.0 - 17.0 g/dL 12.7  11.1  11.7   Hematocrit 39.0 - 52.0 % 38.2  33.8  35.3   Platelets 150 - 400 K/uL 100  91  102     .    Latest Ref Rng & Units 07/11/2021    9:35 AM 05/10/2021    1:31 PM 04/04/2021   12:30 PM  CMP  Glucose 70 - 99 mg/dL 129  99  139   BUN 8 - 23 mg/dL 31  43  55   Creatinine 0.61 - 1.24 mg/dL 1.76  2.22  2.55   Sodium 135 - 145 mmol/L 138  139  137   Potassium 3.5 - 5.1 mmol/L 4.4  4.5  4.8   Chloride 98 - 111 mmol/L 105  110  104   CO2 22 - 32 mmol/L '27  21  26   ' Calcium 8.9 - 10.3 mg/dL 9.7  9.3  9.6   Total Protein 6.5 - 8.1 g/dL 8.0  7.9  8.1   Total Bilirubin 0.3 - 1.2 mg/dL 0.6  0.4  0.5   Alkaline Phos 38 - 126 U/L 96  85  82   AST 15 - 41 U/L '20  25  19   ' ALT 0 - 44 U/L '22  27  24      ' RADIOGRAPHIC STUDIES: I have personally reviewed the radiological images as listed and agreed with  the findings in the report. NM PET Image Initial (PI) Whole Body  Result Date: 07/11/2021 CLINICAL DATA:  Initial treatment strategy for multiple myeloma. Small myeloma. EXAM: NUCLEAR MEDICINE PET WHOLE BODY TECHNIQUE: 9.8 mCi F-18 FDG was injected intravenously. Full-ring PET imaging was performed from the head to foot after the radiotracer. CT data was obtained and used for attenuation correction and anatomic localization. Fasting blood glucose: 128 mg/dl COMPARISON:  None FINDINGS: Mediastinal blood pool activity: SUV max 2.6 HEAD/NECK: No hypermetabolic activity in the scalp. No hypermetabolic cervical lymph nodes. Incidental CT findings: none CHEST: No hypermetabolic mediastinal or hilar nodes. No suspicious pulmonary nodules on the CT scan. Incidental CT findings: Midline sternotomy.  Pacemaker noted ABDOMEN/PELVIS: No abnormal hypermetabolic activity within the liver, pancreas, adrenal glands, or spleen. No hypermetabolic lymph nodes in the abdomen or pelvis. Incidental CT findings: Prostatomegaly. SKELETON: No focal activity within the skeleton to suggest  active multiple myeloma. Postsurgical change in the lumbar spine and pelvis. Incidental CT findings: none EXTREMITIES: No abnormal hypermetabolic activity in the lower extremities. Incidental CT findings: Lytic lesion in the LEFT femur medial condyle without metabolic activity may relate to prior surgery. Extensive arthropathy in the RIGHT midfoot and forefoot associated metabolic activity is favored benign inflammatory/degenerative activity. IMPRESSION: 1. No evidence of active multiple myeloma on whole-body FDG PET scan. 2. No lesions suspicious for myeloma on CT portion exam. 3. No plasmacytoma. 4. Extensive degenerative arthropathy in the RIGHT midfoot and forefoot. 5. Lucent lesion in the LEFT femoral condyle may relate to prior knee replacement. 6. Postsurgical change in the lumbar spine and RIGHT sacrum. Electronically Signed   By: Suzy Bouchard M.D.   On: 07/11/2021 15:33   US RENAL  Result Date: 07/10/2021 CLINICAL DATA:  Chronic kidney disease, stage IV.  Type 2 diabetes. EXAM: RENAL / URINARY TRACT ULTRASOUND COMPLETE COMPARISON:  CT of the abdomen and pelvis 08/06/2014 FINDINGS: Right Kidney: Renal measurements: 11.8 x 6.0 x 6.1 cm = volume: 227 mL. Some thinning of the renal cortex noted. The cortex is hypoechoic to the index organ, the liver. 2 simple cysts are identified, measuring 1.211.1 cm respectively. No solid lesions are present. No stone or obstruction is present. Left Kidney: Renal measurements: 12.3 x 5.0 x 5.1 cm = volume: 166 mL. Renal parenchyma scratched at thinning of the renal cortex noted. The parenchyma is hypoechoic to the index organ, the liver. A simple cyst near the upper pole measures 2.1 x 1.5 x 1.8 cm. No solid lesions are present. No stone or obstruction is present. Bladder: Appears normal for degree of bladder distention. Other: Prostate is enlarged, measuring 5.7 cm maximally. Total volume is 89 mL. Prostate creates and indention on the base of the bladder. IMPRESSION:  1. Thinning of the renal parenchyma bilaterally without solid mass lesion or obstruction. 2. Benign simple cysts bilaterally.  Recommend follow-up. 3. Enlarged prostate. Electronically Signed   By: San Morelle M.D.   On: 07/10/2021 08:18   CUP PACEART REMOTE DEVICE CHECK  Result Date: 07/05/2021 Scheduled remote reviewed. Normal device function.  Next remote 91 days. 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 and less likely to the  monoclonal paraproteinemia.  Also patient has ischemic cardiomyopathy which is likely explanation for his congestive heart failure along with decreasing diuretics and seems to be less likely related to AL amyloidosis clinically.  Plan -Patient notes no acute new symptoms since his last clinic visit. -Labs done on 07/11/2021 were discussed in detail with the patient Patient CBC showed hemoglobin of 12.7 with a normal WBC count and a platelet count of 100k which is improved from 91k CMP shows stable to improved creatinine which is improved to 1.76 down from 2.5 Myeloma panel shows stable M spike of 1.7 g/dL Serum free light chains stable kappa free light chains slightly elevated at 45.3.  Kappa lambda ratio of 2. PET CT scan shows no evidence of active myeloma this was discussed in detail with the patient. Patient has been evaluated by nephrology Patient has no clinical or lab findings suggestive of active myeloma at this time requiring treatment  follow-up Labs in 16 weeks RTC with Dr Irene Limbo in 18 weeks  The total time spent in the appointment was 25 minutes*.  All  of the patient's questions were answered with apparent satisfaction. The patient knows to call the clinic with any problems, questions or concerns.   Sullivan Lone MD MS AAHIVMS [facility] Hematology/Oncology Physician [facility]  .*Total Encounter Time as defined by the Centers for Medicare and Medicaid Services includes, in addition to the face-to-face time of a patient visit (documented in the note above) non-face-to-face time: obtaining and reviewing outside history, ordering and reviewing medications, tests or procedures, care coordination (communications with other health care professionals or caregivers) and documentation in the medical record.  Alan [name] Kitchen

## 2021-08-18 ENCOUNTER — Encounter (HOSPITAL_COMMUNITY): Payer: Medicare HMO

## 2021-08-20 ENCOUNTER — Other Ambulatory Visit: Payer: Self-pay | Admitting: Cardiovascular Disease

## 2021-10-04 ENCOUNTER — Ambulatory Visit (INDEPENDENT_AMBULATORY_CARE_PROVIDER_SITE_OTHER): Payer: Medicare HMO

## 2021-10-04 DIAGNOSIS — I495 Sick sinus syndrome: Secondary | ICD-10-CM

## 2021-10-11 LAB — CUP PACEART REMOTE DEVICE CHECK
Battery Impedance: 1096 Ohm
Battery Remaining Longevity: 56 mo
Battery Voltage: 2.77 V
Brady Statistic AP VP Percent: 22 %
Brady Statistic AP VS Percent: 78 %
Brady Statistic AS VP Percent: 0 %
Brady Statistic AS VS Percent: 0 %
Date Time Interrogation Session: 20230830110718
Implantable Lead Implant Date: 20080208
Implantable Lead Implant Date: 20140130
Implantable Lead Location: 753859
Implantable Lead Location: 753860
Implantable Lead Model: 5076
Implantable Lead Model: 5076
Implantable Pulse Generator Implant Date: 20140130
Lead Channel Impedance Value: 488 Ohm
Lead Channel Impedance Value: 573 Ohm
Lead Channel Pacing Threshold Amplitude: 0.75 V
Lead Channel Pacing Threshold Amplitude: 0.75 V
Lead Channel Pacing Threshold Pulse Width: 0.4 ms
Lead Channel Pacing Threshold Pulse Width: 0.4 ms
Lead Channel Setting Pacing Amplitude: 2 V
Lead Channel Setting Pacing Amplitude: 2.5 V
Lead Channel Setting Pacing Pulse Width: 0.4 ms
Lead Channel Setting Sensing Sensitivity: 2.8 mV

## 2021-10-28 NOTE — Progress Notes (Signed)
Remote pacemaker transmission.   

## 2021-11-07 ENCOUNTER — Other Ambulatory Visit: Payer: Self-pay | Admitting: *Deleted

## 2021-11-07 DIAGNOSIS — D472 Monoclonal gammopathy: Secondary | ICD-10-CM

## 2021-11-08 ENCOUNTER — Other Ambulatory Visit: Payer: Self-pay

## 2021-11-08 ENCOUNTER — Inpatient Hospital Stay: Payer: Medicare HMO | Attending: Hematology

## 2021-11-08 DIAGNOSIS — D472 Monoclonal gammopathy: Secondary | ICD-10-CM | POA: Diagnosis present

## 2021-11-08 LAB — CMP (CANCER CENTER ONLY)
ALT: 22 U/L (ref 0–44)
AST: 20 U/L (ref 15–41)
Albumin: 4 g/dL (ref 3.5–5.0)
Alkaline Phosphatase: 72 U/L (ref 38–126)
Anion gap: 4 — ABNORMAL LOW (ref 5–15)
BUN: 30 mg/dL — ABNORMAL HIGH (ref 8–23)
CO2: 25 mmol/L (ref 22–32)
Calcium: 9.2 mg/dL (ref 8.9–10.3)
Chloride: 105 mmol/L (ref 98–111)
Creatinine: 1.81 mg/dL — ABNORMAL HIGH (ref 0.61–1.24)
GFR, Estimated: 38 mL/min — ABNORMAL LOW (ref >60)
Glucose, Bld: 140 mg/dL — ABNORMAL HIGH (ref 70–99)
Potassium: 4.1 mmol/L (ref 3.5–5.1)
Sodium: 134 mmol/L — ABNORMAL LOW (ref 135–145)
Total Bilirubin: 0.7 mg/dL (ref 0.3–1.2)
Total Protein: 7.8 g/dL (ref 6.5–8.1)

## 2021-11-08 LAB — CBC WITH DIFFERENTIAL (CANCER CENTER ONLY)
Abs Immature Granulocytes: 0.02 10*3/uL (ref 0.00–0.07)
Basophils Absolute: 0.1 10*3/uL (ref 0.0–0.1)
Basophils Relative: 1 %
Eosinophils Absolute: 0.1 10*3/uL (ref 0.0–0.5)
Eosinophils Relative: 2 %
HCT: 41 % (ref 39.0–52.0)
Hemoglobin: 13.7 g/dL (ref 13.0–17.0)
Immature Granulocytes: 0 %
Lymphocytes Relative: 18 %
Lymphs Abs: 0.9 10*3/uL (ref 0.7–4.0)
MCH: 31.6 pg (ref 26.0–34.0)
MCHC: 33.4 g/dL (ref 30.0–36.0)
MCV: 94.5 fL (ref 80.0–100.0)
Monocytes Absolute: 0.4 10*3/uL (ref 0.1–1.0)
Monocytes Relative: 8 %
Neutro Abs: 3.4 10*3/uL (ref 1.7–7.7)
Neutrophils Relative %: 71 %
Platelet Count: 100 10*3/uL — ABNORMAL LOW (ref 150–400)
RBC: 4.34 MIL/uL (ref 4.22–5.81)
RDW: 13.8 % (ref 11.5–15.5)
WBC Count: 4.8 10*3/uL (ref 4.0–10.5)
nRBC: 0 % (ref 0.0–0.2)

## 2021-11-08 LAB — SEDIMENTATION RATE: Sed Rate: 21 mm/hr — ABNORMAL HIGH (ref 0–16)

## 2021-11-08 LAB — LACTATE DEHYDROGENASE: LDH: 135 U/L (ref 98–192)

## 2021-11-09 LAB — KAPPA/LAMBDA LIGHT CHAINS
Kappa free light chain: 39.4 mg/L — ABNORMAL HIGH (ref 3.3–19.4)
Kappa, lambda light chain ratio: 2.11 — ABNORMAL HIGH (ref 0.26–1.65)
Lambda free light chains: 18.7 mg/L (ref 5.7–26.3)

## 2021-11-14 LAB — MULTIPLE MYELOMA PANEL, SERUM
Albumin SerPl Elph-Mcnc: 3.6 g/dL (ref 2.9–4.4)
Albumin/Glob SerPl: 0.9 (ref 0.7–1.7)
Alpha 1: 0.2 g/dL (ref 0.0–0.4)
Alpha2 Glob SerPl Elph-Mcnc: 0.7 g/dL (ref 0.4–1.0)
B-Globulin SerPl Elph-Mcnc: 0.9 g/dL (ref 0.7–1.3)
Gamma Glob SerPl Elph-Mcnc: 2.4 g/dL — ABNORMAL HIGH (ref 0.4–1.8)
Globulin, Total: 4.3 g/dL — ABNORMAL HIGH (ref 2.2–3.9)
IgA: 136 mg/dL (ref 61–437)
IgG (Immunoglobin G), Serum: 2292 mg/dL — ABNORMAL HIGH (ref 603–1613)
IgM (Immunoglobulin M), Srm: 27 mg/dL (ref 15–143)
M Protein SerPl Elph-Mcnc: 2 g/dL — ABNORMAL HIGH
Total Protein ELP: 7.9 g/dL (ref 6.0–8.5)

## 2021-11-22 ENCOUNTER — Inpatient Hospital Stay (HOSPITAL_BASED_OUTPATIENT_CLINIC_OR_DEPARTMENT_OTHER): Payer: Medicare HMO | Admitting: Hematology

## 2021-11-22 ENCOUNTER — Other Ambulatory Visit: Payer: Self-pay

## 2021-11-22 VITALS — BP 151/87 | HR 88 | Temp 98.2°F | Resp 15 | Wt 207.5 lb

## 2021-11-22 DIAGNOSIS — D472 Monoclonal gammopathy: Secondary | ICD-10-CM | POA: Diagnosis not present

## 2021-11-22 NOTE — Progress Notes (Signed)
[name] Kitchen   HEMATOLOGY/ONCOLOGY CLINIC NOTE  Date of Service: 11/22/2021  Patient Care Team: [name], MD as PCP - General (Family Medicine) [name], Dani Gobble, MD as PCP - Cardiology (Cardiology) [name], Dani Gobble, MD as Consulting Physician (Cardiology) Lavonna Monarch, MD (Inactive) as Consulting Physician (Dermatology) Warren Danes, PA-C as Physician Assistant (Dermatology)  CHIEF COMPLAINTS/PURPOSE OF CONSULTATION:  Follow-up for evaluation and management of plasma cell dyscrasia and monoclonal paraproteinemia  HISTORY OF PRESENTING ILLNESS:  See previous note for details of initial presentation.  INTERVAL HISTORY: Alan [name] is a 81 y.o. male is here for continued valuation and management of her smoldering myeloma. He reports He is doing well with no new symptoms or concerns.  We discussed that his counts have improved including his anemia which is normalized.  We discussed getting an ultrasound to evaluate for gynecomastia as he has noted some slight chest tenderness in right pectoral. No fhx of breast cancer. He was advised to reducing any rubbing of his pectoral and reducing irritation to the area.   No fever, chills, or night sweats. No new or unexpected weight loss. No new focal bone pains. No back pains. No new falls. No other new or acute focal symptoms.  Labs done on 11/08/2021 were discussed in detail with the patient.  MEDICAL HISTORY:  Past Medical History:  Diagnosis Date   Anticoagulant long-term use    Arthritis    Arthropathy of left shoulder    Atypical mole    Biceps tendonitis, left    Bursitis of left shoulder    Cardiac pacemaker in situ    first insertion 2008;  genertor and new lead change 03-08-2012;   [employer]   CHB (complete heart block) ([place])    s/p  PPM 2008   Chronic back pain    CKD (chronic kidney disease), stage III [facility])    Coronary artery disease cardiologist--- dr croitoru   hx  CABG x2  1990 and re-do 2004;  multiple PCI  to [place] to 2004;    cardiac cath 2012  occluded LAD and RCA with patent grafts   Difficult intubation    " Dr. Sallyanne Kuster said that it was difficulty to get the tube in."   History of coronary angioplasty    multiple PCI to RCA ,  1990 to 2004   History of GI bleed followed by GI-- Olevia Perches PA @ [facility] [place]   01/ 2020  upper and lower GI bleed ;  s/p  EGD with cautery of jejunal bleed and blood transfusion's   History of kidney stones    Hx of echocardiogram 06/29/2008   EF 45-50%    Hypertension    Ischemic cardiomyopathy    previously reported , ef 40-45%;  2010--- ef 45-50%;   last echo 2017 55-60%   Mixed hyperlipidemia    Nocturia    OSA on CPAP    cpap, 12, last sleep study Jan2013, sees Dr. Keturah Barre   PAF (paroxysmal atrial fibrillation) ([place])    long hx PAF--- followed by dr croitoru   Post-polio syndrome    dx polio age 54;   09-30-2018  per pt occasional gait issues and occasion fall   Presence of permanent cardiac pacemaker    Pseudoarthrosis of lumbar spine    Rotator cuff tear, left    S/P CABG x 2    1990 x2  and 2004  x2   Sinus node dysfunction ([place])    2015--- sinus node  arrest  with intact AV conduction   Squamous cell carcinoma of skin 06/14/2021   Mid Parietal Scalp (in situ) (tx p bx)   Type 2 diabetes mellitus ([place])    followed by pcp    SURGICAL HISTORY: Past Surgical History:  Procedure Laterality Date   APPENDECTOMY  child   APPLICATION OF INTRAOPERATIVE CT SCAN N/A 07/20/2020   Procedure: APPLICATION OF INTRAOPERATIVE CT SCAN;  Surgeon: [name], Theodoro Doing, DO;  Location: [place];  Service: Neurosurgery;  Laterality: N/A;   BLEPHAROPLASTY Bilateral 12/2014   upper eyelid   CARDIAC PACEMAKER PLACEMENT  2008   CARDIOVERSION N/A 01/15/2019   Procedure: CARDIOVERSION;  Surgeon: Sanda Klein, MD;  Location: [place] ENDOSCOPY;  Service: Cardiovascular;  Laterality: N/A;   CARPAL TUNNEL RELEASE Bilateral 2015;  2016   CATARACT  EXTRACTION W/ INTRAOCULAR LENS  IMPLANT, BILATERAL  2017   CORONARY ANGIOPLASTY  1990  to 2004   multiple to [place]    @ [place] (dr chitwood)   seq. LIMA to LAD and Diagonal   CORONARY ARTERY BYPASS GRAFT  2004     dr Amador Cunas   SVG to  RCA and PDA;  ALSO MAZE  PROCEDURE   ESOPHAGOGASTRODUODENOSCOPY Left 03/18/2013   Procedure: ESOPHAGOGASTRODUODENOSCOPY (EGD);  Surgeon: Cleotis Nipper, MD;  Location: [facility] ENDOSCOPY;  Service: Endoscopy;  Laterality: Left;   HARDWARE REMOVAL N/A 05/18/2016   Procedure: Removal of broken hardware Lumbar three-four;  Surgeon: [name], MD;  Location: [place];  Service: Neurosurgery;  Laterality: N/A;   HERNIA REPAIR     LEFT HEART CATH AND CORS/GRAFTS ANGIOGRAPHY N/A 12/18/2018   Procedure: LEFT HEART CATH AND CORS/GRAFTS ANGIOGRAPHY;  Surgeon: Wellington Hampshire, MD;  Location: [place] CV LAB;  Service: Cardiovascular;  Laterality: N/A;   LEFT HEART CATHETERIZATION WITH CORONARY/GRAFT ANGIOGRAM N/A 12/21/2010   Procedure: LEFT HEART CATHETERIZATION WITH Beatrix Fetters;  Surgeon: Leonie Man, MD;  Location: [facility] CATH LAB;  Service: Cardiovascular;  Laterality: N/A;   LEG SURGERY Right 2002  approx.   lengthened his leg   LUMBAR LAMINECTOMY/DECOMPRESSION MICRODISCECTOMY  01/10/2012   Procedure: LUMBAR LAMINECTOMY/DECOMPRESSION MICRODISCECTOMY 1 LEVEL;  Surgeon: [name], MD;  Location: [place] NEURO ORS;  Service: Neurosurgery;  Laterality: Bilateral;  Lumbar three-four decompression, Posterior lateral fusion lumbar three-four, posterior spinus plate lumbar three-four   LUMBAR LAMINECTOMY/DECOMPRESSION MICRODISCECTOMY Left 05/18/2016   Procedure: Laminectomy and Foraminotomy - Lumbar two-lumbar three- Lumbar four-lumbar five- left with removal hardware lumbar three-four;  Surgeon: [name], MD;  Location: [place];  Service: Neurosurgery;  Laterality: Left;   NASAL SEPTUM SURGERY  yrs ago   PACEMAKER  REVISION N/A 03/07/2012   Procedure: PACEMAKER REVISION;  Surgeon: Sanda Klein, MD;  Location: [place] CATH LAB;  Service: Cardiovascular;  Laterality: N/A;   POSTERIOR LUMBAR FUSION  02-22-2017   dr Ronnald Ramp  @ [facility]   re-do laminectomy L2-3 and fusion   POSTERIOR LUMBAR FUSION  03-11-2018   dr Ronnald Ramp @ [facility]   laminectomy L4-5;  fixation L2-S1 and fusion L3-S1   SACROILIAC JOINT FUSION N/A 07/20/2020   Procedure: Sacroiliac Joint Fusion;  Surgeon: Karsten Ro, DO;  Location: [place];  Service: Neurosurgery;  Laterality: N/A;   SHOULDER ARTHROSCOPY Right after 2016, pt unsure year   SHOULDER ARTHROSCOPY WITH OPEN ROTATOR CUFF REPAIR Right 03/30/2014   Procedure: RIGHT SHOULDER ARTHROSCOPY  OPEN ROTATOR CUFF REPAIR;  Surgeon: Kerin Salen, MD;  Location:  [place] OR;  Service: Orthopedics;  Laterality: Right;   SHOULDER ARTHROSCOPY WITH SUBACROMIAL DECOMPRESSION, ROTATOR CUFF REPAIR AND BICEP TENDON REPAIR Left 10/01/2018   Procedure: LEFT SHOULDER ARTHROSCOPY, DISTAL CLAVICLE EXCISION,SUBACROMIAL, DECOMPRESSION, RORATOR CUFF REPAIR, BICEPS TENODESIS;  Surgeon: Renette Butters, MD;  Location: [place];  Service: Orthopedics;  Laterality: Left;   SHOULDER OPEN ROTATOR CUFF REPAIR Right 03/30/2014   Procedure: ROTATOR CUFF REPAIR SHOULDER OPEN;  Surgeon: Kerin Salen, MD;  Location: [place];  Service: Orthopedics;  Laterality: Right;   TEE WITHOUT CARDIOVERSION N/A 01/15/2019   Procedure: TRANSESOPHAGEAL ECHOCARDIOGRAM (TEE);  Surgeon: Sanda Klein, MD;  Location: [facility] ENDOSCOPY;  Service: Cardiovascular;  Laterality: N/A;   TONSILLECTOMY  child   TOTAL KNEE ARTHROPLASTY Left 2006    SOCIAL HISTORY: Social History   Socioeconomic History   Marital status: Single    Spouse name: Not on file   Number of children: 1   Years of education: 16   Highest education level: Associate degree: academic program  Occupational History   Occupation: pilot-retired  Tobacco Use   Smoking status: Never    Smokeless tobacco: Never  Vaping Use   Vaping Use: Never used  Substance and Sexual Activity   Alcohol use: Not Currently   Drug use: Never   Sexual activity: Yes  Other Topics Concern   Not on file  Social History Narrative   Lives on [address] @ [employer], fishes.    Right-handed.   1 cup caffeine per day.   Social Determinants of Health   Financial Resource Strain: Low Risk  (02/04/2021)   Overall Financial Resource Strain (CARDIA)    Difficulty of Paying Living Expenses: Not hard at all  Food Insecurity: No Food Insecurity (02/04/2021)   Hunger Vital Sign    Worried About Running Out of Food in the Last Year: Never true    Ran Out of Food in the Last Year: Never true  Transportation Needs: No Transportation Needs (02/04/2021)   PRAPARE - Hydrologist (Medical): No    Lack of Transportation (Non-Medical): No  Physical Activity: Not on file  Stress: Not on file  Social Connections: Not on file  Intimate Partner Violence: Not on file  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  Lives in [place]. Non-smoker.  Rare alcohol use.   FAMILY HISTORY: Family History  Problem Relation Age of Onset   Other Father        MVA   Cancer Mother     ALLERGIES:  has No Known Allergies.  MEDICATIONS:  Current Outpatient Medications  Medication Sig Dispense Refill   acetaminophen (TYLENOL) 500 MG tablet Take 1,000 mg by mouth every 6 (six) hours as needed for mild pain or headache.     amiodarone (PACERONE) 200 MG tablet TAKE 2 TABLETS BY MOUTH DAILY FOR 14 DAYS, THEN TAKE ONE TABLET ONCE DAILY. 45 tablet 2   carvedilol (COREG) 25 MG tablet TAKE 2 TABLETS BY MOUTH TWICE DAILY WITH A MEAL 300 tablet 3   clopidogrel (PLAVIX) 75 MG tablet Take 1 tablet (75 mg total) by mouth daily. 90 tablet 3   empagliflozin (JARDIANCE) 10 MG TABS tablet Take 10 mg by mouth daily.     ferrous sulfate 325 (65 FE) MG EC tablet Take 325 mg by mouth 2 (two) times daily.      glipiZIDE (GLUCOTROL) 10 MG tablet Take 10 mg by mouth 2 (two) times daily.  insulin lispro (HUMALOG) 100 UNIT/ML injection Inject 2-8 Units into the skin as needed for high blood sugar. Take as needed based on CBG value     losartan (COZAAR) 50 MG tablet Take 1 tablet (50 mg total) by mouth daily. (Patient not taking: Reported on 07/19/2021) 30 tablet 6   meclizine (ANTIVERT) 25 MG tablet Take 25 mg by mouth 3 (three) times daily as needed.     metFORMIN (GLUCOPHAGE-XR) 500 MG 24 hr tablet Take 500 mg by mouth in the morning and at bedtime. (Patient not taking: Reported on 07/19/2021)     Multiple Vitamin (MULTIVITAMIN WITH MINERALS) TABS tablet Take 1 tablet by mouth daily.      ONETOUCH ULTRA test strip USE 1 STRIP TO CHECK GLUCOSE 4 TIMES DAILY     pantoprazole (PROTONIX) 40 MG tablet Take 1 tablet (40 mg total) by mouth daily. 30 tablet 0   torsemide (DEMADEX) 10 MG tablet Take 1 tablet by mouth once daily 30 tablet 10   No current facility-administered medications for this visit.    REVIEW OF SYSTEMS:   10 Point review of Systems was done is negative except as noted above.  PHYSICAL EXAMINATION: ECOG PERFORMANCE STATUS: 2 - Symptomatic, <50% confined to bed  . Vitals:   11/22/21 1227  BP: (!) 151/87  Pulse: 88  Resp: 15  Temp: 98.2 F (36.8 C)  SpO2: 99%    Filed Weights   11/22/21 1227  Weight: 207 lb 8 oz (94.1 kg)    .Body mass index is 30.2 kg/m. NAD GENERAL:alert, in no acute distress and comfortable SKIN: no acute rashes, no significant lesions EYES: conjunctiva are pink and non-injected, sclera anicteric NECK: supple, no JVD LYMPH:  no palpable lymphadenopathy in the cervical, axillary or inguinal regions LUNGS: clear to auscultation b/l with normal respiratory effort HEART: regular rate & rhythm ABDOMEN:  normoactive bowel sounds , not distended. Right pectoral tenderness. Extremity: no pedal edema PSYCH: alert & oriented x 3 with fluent  speech NEURO: no focal motor/sensory deficits  LABORATORY DATA:  I have reviewed the data as listed  .    Latest Ref Rng & Units 11/08/2021   12:48 PM 07/11/2021    9:35 AM 05/03/2021    7:59 AM  CBC  WBC 4.0 - 10.5 K/uL 4.8  5.6  7.4   Hemoglobin 13.0 - 17.0 g/dL 13.7  12.7  11.1   Hematocrit 39.0 - 52.0 % 41.0  38.2  33.8   Platelets 150 - 400 K/uL 100  100  91     .    Latest Ref Rng & Units 11/08/2021   12:48 PM 07/11/2021    9:35 AM 05/10/2021    1:31 PM  CMP  Glucose 70 - 99 mg/dL 140  129  99   BUN 8 - 23 mg/dL 30  31  43   Creatinine 0.61 - 1.24 mg/dL 1.81  1.76  2.22   Sodium 135 - 145 mmol/L 134  138  139   Potassium 3.5 - 5.1 mmol/L 4.1  4.4  4.5   Chloride 98 - 111 mmol/L 105  105  110   CO2 22 - 32 mmol/L '25  27  21   '$ Calcium 8.9 - 10.3 mg/dL 9.2  9.7  9.3   Total Protein 6.5 - 8.1 g/dL 7.8  8.0  7.9   Total Bilirubin 0.3 - 1.2 mg/dL 0.7  0.6  0.4   Alkaline Phos 38 - 126 U/L 72  96  85   AST 15 - 41 U/L '20  20  25   '$ ALT 0 - 44 U/L '22  22  27      '$ RADIOGRAPHIC STUDIES: I have personally reviewed the radiological images as listed and agreed with the findings in the report. No results found.  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 and less likely to the monoclonal paraproteinemia.  Also patient has ischemic cardiomyopathy which is likely explanation for his congestive heart failure along with decreasing diuretics and seems to be less likely related to AL amyloidosis clinically.  Plan -Patient notes no acute new symptoms since his last clinic visit. -Labs done on 11/08/2021  were discussed in detail with the patient. Counts normalized. Patient CBC showed hemoglobin normalized at 13.7 with a normal WBC count and a platelet count of 100k which is remaining stable. CMP stable. Myeloma panel shows M spike of 2.0 g/dL Serum free light chains stable kappa free light chains slightly elevated at 39.4.  Kappa lambda ratio of 2.11. LDH is 135. Sed rate is slightly elevated at 21 mm/hr. Patient has been evaluated by nephrology Patient has no clinical or lab findings suggestive of active myeloma at this time requiring treatment  follow-up Labs in 18 weeks RTC with Dr Irene Limbo in 20 weeks  The total time spent in the appointment was 20 minutes*.  All of the patient's questions were answered with apparent satisfaction. The patient knows to call the clinic with any problems, questions or concerns.   Sullivan Lone MD MS AAHIVMS [facility] Hematology/Oncology Physician [facility]  .*Total Encounter Time as defined by the Centers for Medicare and Medicaid Services includes, in addition to the face-to-face time of a patient visit (documented in the note above) non-face-to-face time: obtaining and reviewing outside history, ordering and reviewing medications, tests or procedures, care coordination (communications with other health care professionals or caregivers) and documentation in the medical record.  I, Melene Muller, am acting as scribe for Dr. Sullivan Lone, MD.  .I have reviewed the above documentation for accuracy and completeness, and I agree with the above. Brunetta Genera MD

## 2021-11-30 ENCOUNTER — Ambulatory Visit (INDEPENDENT_AMBULATORY_CARE_PROVIDER_SITE_OTHER): Payer: Medicare HMO

## 2021-11-30 DIAGNOSIS — Z23 Encounter for immunization: Secondary | ICD-10-CM

## 2021-12-05 ENCOUNTER — Telehealth (HOSPITAL_COMMUNITY): Payer: Self-pay | Admitting: *Deleted

## 2021-12-05 NOTE — Telephone Encounter (Signed)
Can work in to see me tomorrow if possible.

## 2021-12-05 NOTE — Telephone Encounter (Signed)
Pts pcp called stating pt is c/o shortness of breath and his bnp is [phone].   Routed to [place]

## 2021-12-06 NOTE — Telephone Encounter (Signed)
Emer Colleran,RN aware and will work with Adline Potter to schedule pt.

## 2021-12-09 ENCOUNTER — Encounter (HOSPITAL_COMMUNITY): Payer: Medicare HMO | Admitting: Cardiology

## 2021-12-16 ENCOUNTER — Ambulatory Visit (HOSPITAL_COMMUNITY)
Admission: RE | Admit: 2021-12-16 | Discharge: 2021-12-16 | Disposition: A | Discharge status: Home / Self Care | Payer: Medicare HMO | Source: Ambulatory Visit | Attending: Cardiology | Admitting: Cardiology

## 2021-12-16 ENCOUNTER — Encounter (HOSPITAL_COMMUNITY): Payer: Self-pay | Admitting: Cardiology

## 2021-12-16 VITALS — BP 160/70 | HR 64 | Wt 210.8 lb

## 2021-12-16 DIAGNOSIS — I480 Paroxysmal atrial fibrillation: Secondary | ICD-10-CM | POA: Diagnosis not present

## 2021-12-16 DIAGNOSIS — K922 Gastrointestinal hemorrhage, unspecified: Secondary | ICD-10-CM | POA: Diagnosis not present

## 2021-12-16 DIAGNOSIS — Z7984 Long term (current) use of oral hypoglycemic drugs: Secondary | ICD-10-CM | POA: Insufficient documentation

## 2021-12-16 DIAGNOSIS — Z951 Presence of aortocoronary bypass graft: Secondary | ICD-10-CM | POA: Insufficient documentation

## 2021-12-16 DIAGNOSIS — I43 Cardiomyopathy in diseases classified elsewhere: Secondary | ICD-10-CM | POA: Diagnosis not present

## 2021-12-16 DIAGNOSIS — I442 Atrioventricular block, complete: Secondary | ICD-10-CM | POA: Diagnosis not present

## 2021-12-16 DIAGNOSIS — Z79899 Other long term (current) drug therapy: Secondary | ICD-10-CM | POA: Insufficient documentation

## 2021-12-16 DIAGNOSIS — Z7902 Long term (current) use of antithrombotics/antiplatelets: Secondary | ICD-10-CM | POA: Diagnosis not present

## 2021-12-16 DIAGNOSIS — G5603 Carpal tunnel syndrome, bilateral upper limbs: Secondary | ICD-10-CM | POA: Diagnosis not present

## 2021-12-16 DIAGNOSIS — C9000 Multiple myeloma not having achieved remission: Secondary | ICD-10-CM | POA: Diagnosis not present

## 2021-12-16 DIAGNOSIS — I2510 Atherosclerotic heart disease of native coronary artery without angina pectoris: Secondary | ICD-10-CM | POA: Diagnosis not present

## 2021-12-16 DIAGNOSIS — E785 Hyperlipidemia, unspecified: Secondary | ICD-10-CM | POA: Diagnosis not present

## 2021-12-16 DIAGNOSIS — Z950 Presence of cardiac pacemaker: Secondary | ICD-10-CM | POA: Diagnosis not present

## 2021-12-16 DIAGNOSIS — N184 Chronic kidney disease, stage 4 (severe): Secondary | ICD-10-CM | POA: Diagnosis not present

## 2021-12-16 DIAGNOSIS — I130 Hypertensive heart and chronic kidney disease with heart failure and stage 1 through stage 4 chronic kidney disease, or unspecified chronic kidney disease: Secondary | ICD-10-CM | POA: Diagnosis not present

## 2021-12-16 DIAGNOSIS — E854 Organ-limited amyloidosis: Secondary | ICD-10-CM | POA: Insufficient documentation

## 2021-12-16 DIAGNOSIS — I5032 Chronic diastolic (congestive) heart failure: Secondary | ICD-10-CM

## 2021-12-16 NOTE — Patient Instructions (Signed)
No changes to medication.  Your physician has requested that you have an echocardiogram. Echocardiography is a painless test that uses sound waves to create images of your heart. It provides your doctor with information about the size and shape of your heart and how well your heart's chambers and valves are working. This procedure takes approximately one hour. There are no restrictions for this procedure. Please do NOT wear cologne, perfume, aftershave, or lotions (deodorant is allowed). Please arrive 15 minutes prior to your appointment time.  Your physician recommends that you schedule a follow-up appointment in: 3 months ( February 2024) ** please call the office in December to arrange your follow up appointment **  If you have any questions or concerns before your next appointment please send [place] a message through [place] or call our office at [phone].    TO LEAVE A MESSAGE FOR THE NURSE SELECT OPTION 2, PLEASE LEAVE A MESSAGE INCLUDING: YOUR NAME DATE OF BIRTH CALL BACK NUMBER REASON FOR CALL**this is important as we prioritize the call backs  YOU WILL RECEIVE A CALL BACK THE SAME DAY AS LONG AS YOU CALL BEFORE 4:00 PM  At the [facility], you and your health needs are our priority. As part of our continuing mission to provide you with exceptional heart care, we have created designated Provider Care Teams. These Care Teams include your primary Cardiologist (physician) and Advanced Practice Providers (APPs- Physician Assistants and Nurse Practitioners) who all work together to provide you with the care you need, when you need it.   You may see any of the following providers on your designated Care Team at your next follow up: Dr Glori Bickers Dr Loralie Champagne Dr. Roxana Hires, NP Lyda Jester, [place] [facility] [place], [place] Forestine Na, NP Audry Riles, PharmD   Please be sure to bring in all your medications bottles to every  appointment.

## 2021-12-18 NOTE — Progress Notes (Signed)
PCP: [name], MD Cardiology: Dr. Sallyanne Kuster HF Cardiology: Dr. Aundra Dubin  81 y.o. with history of complete heart block s/p pacemaker, CAD s/p CABG and redo, CKD stage 3, GI bleeding, and chronic diastolic CHF was referred to CHF clinic from the [facility] clinic. Patient has a long history of CAD. He had his initial CABG in 1990 then redo in 2004. Last cath in 11/20 showed patent sequential LIMA-LAD/D and SVG-PDA, medical management.  He has a Medtronic PPM due to complete heart block, has had this for years.  He has paroxysmal atrial fibrillation, in NSR today. He is not on anticoagulation due to GI bleeding.  He has not had suitable anatomy for Watchman due to surgical clipping with partial closure of the LA appendage.   Patient has struggled recently with diastolic CHF.  He was admitted in 12/22 with CHF exacerbation.  Echo in 12/22 showed EF 55-60%, mild LVH, normal RV, moderate LAE. With diastolic CHF, PAF, biceps tendon rupture, and bilateral carpal tunnel syndrome, cardiac amyloidosis was considered.  PYP scan was not suggestive of TTR cardiac amyloidosis. 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 He has been seen by hematology, bone marrow biopsy in 3/23 showed plasma cell neoplasm with >10% clonal plasma cells, biopsy stained negative for amyloidosis.    Patient returns for followup of CHF.  BP is elevated today but SBP generally around 130 when he checks at home.  Main complaint recently has been fullness in his ears.  He has had a round of antibiotics for this.  Breathing is ok, he does not feel like he has excess fluid.  No dyspnea walking on flat ground.  No orthopnea/PND. No chest pain.  No lightheadedness.  He is going to [employer] for the [employer] for Thanksgiving.  Labs (1/23): K 4, creatinine 2.37, myeloma panel with IgG monoclonal protein with kappa light chain specificity, urine immunofixation with IgG monoclonal protein. Labs (2/23): K  4.8, creatinine 2.9 => 2.55, LFTs normal, TSH normal, BNP 102 Labs (3/23): hgb 11.1, plts 91 Labs (10/23): hgb 13.7, K 4.1, creatinine 1.81  PMH: 1. Atrial fibrillation: Paroxysmal 2. Complete heart block: MDT PPM placed in his 50s.  3. Biceps tendon rupture 4. 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 HTN 7. Hyperlipidemia: h/o statin myopathy, muscle pain with Zetia, PCSK9 has been too expensive.  8. CKD stage 4 9. H/o GI bleeding: Not suitable Watchman candidate due to partial closure of the appendage by surgical clip.  He is not anticoagulated.  10. Chronic diastolic CHF: Echo (41/74) with EF 55-60%, mild LVH, normal RV, moderate LAE.  - PYP scan (1/23): grade 1, H/CL 0.8 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 Bone marrow biopsy 3/23 with plasma cell neoplasm with >10% clonal plasma cells, stain for amyloidosis was negative.  Smoldering myeloma.  12. CAD: CABG x 2 in 1990, redo CABG x 2 in 2004.  - LHC (11/20): occluded proximal LAD, 99% dLCx, occluded proximal RCA, LIMA-LAD/diagonal patent, SVG-PDA patent. Distal LCx was small, managed medically.  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   SH: Retired Insurance underwriter, also was a Technical brewer.  Lives in [place].  Nonsmoker.  Rare ETOH.   Family History  Problem Relation Age of Onset   Other Father        MVA   Cancer Mother    ROS: All systems reviewed and negative except as per HPI.   Current Outpatient Medications  Medication Sig Dispense Refill  acetaminophen (TYLENOL) 500 MG tablet Take 1,000 mg by mouth every 6 (six) hours as needed for mild pain or headache.     amiodarone (PACERONE) 200 MG tablet Take 200 mg by mouth daily.     carvedilol (COREG) 25 MG tablet TAKE 2 TABLETS BY MOUTH TWICE DAILY WITH A MEAL 300 tablet 3   clopidogrel (PLAVIX) 75 MG tablet Take 1 tablet (75 mg total) by mouth daily. 90 tablet 3   empagliflozin (JARDIANCE) 10 MG TABS tablet Take 10 mg by mouth daily.     ferrous  sulfate 325 (65 FE) MG EC tablet Take 325 mg by mouth 2 (two) times daily.     glipiZIDE (GLUCOTROL) 10 MG tablet Take 10 mg by mouth 2 (two) times daily.     insulin lispro (HUMALOG) 100 UNIT/ML injection Inject 2-8 Units into the skin as needed for high blood sugar. Take as needed based on CBG value     Multiple Vitamin (MULTIVITAMIN WITH MINERALS) TABS tablet Take 1 tablet by mouth daily.      ONETOUCH ULTRA test strip USE 1 STRIP TO CHECK GLUCOSE 4 TIMES DAILY     pantoprazole (PROTONIX) 40 MG tablet Take 1 tablet (40 mg total) by mouth daily. 30 tablet 0   torsemide (DEMADEX) 10 MG tablet Take 5 mg by mouth daily.     No current facility-administered medications for this encounter.   BP (!) 160/70   Pulse 64   Wt 95.6 kg (210 lb 12.8 oz)   SpO2 99%   BMI 30.68 kg/m  General: NAD Neck: No JVD, no thyromegaly or thyroid nodule.  Lungs: Clear to auscultation bilaterally with normal respiratory effort. CV: Nondisplaced PMI.  Heart regular S1/S2, no S3/S4, no murmur.  No peripheral edema.  No carotid bruit.  Normal pedal pulses.  Abdomen: Soft, nontender, no hepatosplenomegaly, no distention.  Skin: Intact without lesions or rashes.  Neurologic: Alert and oriented x 3.  Psych: Normal affect. Extremities: No clubbing or cyanosis.  HEENT: Normal.   Assessment/Plan: 1. Chronic diastolic CHF: Echo 33/35 showed EF 55-60%, mild LVH, normal RV, moderate LAE.  There was concern for cardiac amyloidosis with bilateral carpal tunnel syndrome and biceps tendon rupture.  PYP scan was not suggestive of TTR cardiac amyloidosis, but myeloma panel and urine immunofixation were suggestive of monoclonal gammopathy. Bone marrow biopsy showed plasma cell neoplasm with >10% clonal plasma cells, biopsy staining was negative for amyloidosis.  Given lack of rapid progression of cardiac disease, suspect he does not have cardiac AL amyloidosis.  He is unable to get cardiac MRI, PPM is not compatible.  NYHA class II  symptoms.  He looks euvolemic on exam.  - Continue torsemide 5 mg daily, BMET today.  - Continue Jardiance 10 mg daily.   - Repeat echo at followup in 3 months.  2. Monoclonal gammopathy: Bone marrow biopsy in 3/23 with plasma cell neoplasm with >10% clonal plasma cells, staining was negative for amyloidosis.  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  3. CAD: S/p CABG and redo.  Cath in 11/20 showed patent grafts.  He denies chest pain.  - Continue Plavix 75 mg daily.  - He is not on statin due to history of statin myopathy and inability to tolerate Zetia.  PCSK9 inhibitors and Leqvio were too expensive in the past and he is not interested in looking into them again.  4. HTN: BP controlled on Coreg 25 mg bid.  5. Atrial fibrillation: Paroxysmal.  He is in NSR today.  He  is not on anticoagulation due to history of GI bleeding.  - Continue amiodarone 200 daily.  He had labs done for his nephrologist yesterday, will try to get CMET and TSH results. He will need a regular eye exam.  - He is not a Watchman candidate, he has a partially closed LA appendage from surgical clip.  6. H/o complete heart block: He has a MDT PPM.  7. CKD: Stage 4.  Last creatinine was 1.81.   - Will get BMET results from nephrology.  - He sees nephrology.   Followup in 3 months with echo.   Loralie Champagne 12/18/2021

## 2021-12-23 ENCOUNTER — Other Ambulatory Visit (HOSPITAL_COMMUNITY): Payer: Self-pay | Admitting: Cardiology

## 2022-01-03 ENCOUNTER — Ambulatory Visit (INDEPENDENT_AMBULATORY_CARE_PROVIDER_SITE_OTHER): Payer: Medicare HMO

## 2022-01-03 DIAGNOSIS — I495 Sick sinus syndrome: Secondary | ICD-10-CM

## 2022-01-04 LAB — CUP PACEART REMOTE DEVICE CHECK
Battery Impedance: 1231 Ohm
Battery Remaining Longevity: 52 mo
Battery Voltage: 2.76 V
Brady Statistic AP VP Percent: 24 %
Brady Statistic AP VS Percent: 76 %
Brady Statistic AS VP Percent: 0 %
Brady Statistic AS VS Percent: 0 %
Date Time Interrogation Session: 20231128092037
Implantable Lead Connection Status: 753985
Implantable Lead Connection Status: 753985
Implantable Lead Implant Date: 20080208
Implantable Lead Implant Date: 20140130
Implantable Lead Location: 753859
Implantable Lead Location: 753860
Implantable Lead Model: 5076
Implantable Lead Model: 5076
Implantable Pulse Generator Implant Date: 20140130
Lead Channel Impedance Value: 482 Ohm
Lead Channel Impedance Value: 539 Ohm
Lead Channel Pacing Threshold Amplitude: 0.875 V
Lead Channel Pacing Threshold Amplitude: 0.875 V
Lead Channel Pacing Threshold Pulse Width: 0.4 ms
Lead Channel Pacing Threshold Pulse Width: 0.4 ms
Lead Channel Setting Pacing Amplitude: 2 V
Lead Channel Setting Pacing Amplitude: 2.5 V
Lead Channel Setting Pacing Pulse Width: 0.4 ms
Lead Channel Setting Sensing Sensitivity: 2.8 mV
Zone Setting Status: 755011
Zone Setting Status: 755011

## 2022-02-01 NOTE — Progress Notes (Signed)
Remote pacemaker transmission.   

## 2022-03-24 ENCOUNTER — Other Ambulatory Visit: Payer: Self-pay

## 2022-03-24 DIAGNOSIS — D472 Monoclonal gammopathy: Secondary | ICD-10-CM

## 2022-03-28 ENCOUNTER — Inpatient Hospital Stay: Payer: Medicare HMO | Attending: Hematology

## 2022-03-28 DIAGNOSIS — D472 Monoclonal gammopathy: Secondary | ICD-10-CM | POA: Diagnosis not present

## 2022-03-28 LAB — LACTATE DEHYDROGENASE: LDH: 120 U/L (ref 98–192)

## 2022-03-28 LAB — CBC WITH DIFFERENTIAL (CANCER CENTER ONLY)
Abs Immature Granulocytes: 0.02 10*3/uL (ref 0.00–0.07)
Basophils Absolute: 0.1 10*3/uL (ref 0.0–0.1)
Basophils Relative: 1 %
Eosinophils Absolute: 0.1 10*3/uL (ref 0.0–0.5)
Eosinophils Relative: 2 %
HCT: 41.6 % (ref 39.0–52.0)
Hemoglobin: 14 g/dL (ref 13.0–17.0)
Immature Granulocytes: 0 %
Lymphocytes Relative: 14 %
Lymphs Abs: 0.9 10*3/uL (ref 0.7–4.0)
MCH: 32.6 pg (ref 26.0–34.0)
MCHC: 33.7 g/dL (ref 30.0–36.0)
MCV: 97 fL (ref 80.0–100.0)
Monocytes Absolute: 0.7 10*3/uL (ref 0.1–1.0)
Monocytes Relative: 11 %
Neutro Abs: 4.8 10*3/uL (ref 1.7–7.7)
Neutrophils Relative %: 72 %
Platelet Count: 92 10*3/uL — ABNORMAL LOW (ref 150–400)
RBC: 4.29 MIL/uL (ref 4.22–5.81)
RDW: 13.2 % (ref 11.5–15.5)
WBC Count: 6.6 10*3/uL (ref 4.0–10.5)
nRBC: 0 % (ref 0.0–0.2)

## 2022-03-28 LAB — CMP (CANCER CENTER ONLY)
ALT: 17 U/L (ref 0–44)
AST: 16 U/L (ref 15–41)
Albumin: 3.9 g/dL (ref 3.5–5.0)
Alkaline Phosphatase: 73 U/L (ref 38–126)
Anion gap: 6 (ref 5–15)
BUN: 36 mg/dL — ABNORMAL HIGH (ref 8–23)
CO2: 26 mmol/L (ref 22–32)
Calcium: 9.2 mg/dL (ref 8.9–10.3)
Chloride: 103 mmol/L (ref 98–111)
Creatinine: 1.81 mg/dL — ABNORMAL HIGH (ref 0.61–1.24)
GFR, Estimated: 37 mL/min — ABNORMAL LOW (ref >60)
Glucose, Bld: 151 mg/dL — ABNORMAL HIGH (ref 70–99)
Potassium: 4.6 mmol/L (ref 3.5–5.1)
Sodium: 135 mmol/L (ref 135–145)
Total Bilirubin: 0.6 mg/dL (ref 0.3–1.2)
Total Protein: 8.1 g/dL (ref 6.5–8.1)

## 2022-03-28 LAB — SEDIMENTATION RATE: Sed Rate: 32 mm/hr — ABNORMAL HIGH (ref 0–16)

## 2022-03-29 LAB — KAPPA/LAMBDA LIGHT CHAINS
Kappa free light chain: 40.5 mg/L — ABNORMAL HIGH (ref 3.3–19.4)
Kappa, lambda light chain ratio: 2.07 — ABNORMAL HIGH (ref 0.26–1.65)
Lambda free light chains: 19.6 mg/L (ref 5.7–26.3)

## 2022-04-03 LAB — MULTIPLE MYELOMA PANEL, SERUM
Albumin SerPl Elph-Mcnc: 3.7 g/dL (ref 2.9–4.4)
Albumin/Glob SerPl: 1 (ref 0.7–1.7)
Alpha 1: 0.2 g/dL (ref 0.0–0.4)
Alpha2 Glob SerPl Elph-Mcnc: 0.8 g/dL (ref 0.4–1.0)
B-Globulin SerPl Elph-Mcnc: 0.8 g/dL (ref 0.7–1.3)
Gamma Glob SerPl Elph-Mcnc: 2.3 g/dL — ABNORMAL HIGH (ref 0.4–1.8)
Globulin, Total: 4.1 g/dL — ABNORMAL HIGH (ref 2.2–3.9)
IgA: 134 mg/dL (ref 61–437)
IgG (Immunoglobin G), Serum: 2201 mg/dL — ABNORMAL HIGH (ref 603–1613)
IgM (Immunoglobulin M), Srm: 21 mg/dL (ref 15–143)
M Protein SerPl Elph-Mcnc: 1.8 g/dL — ABNORMAL HIGH
Total Protein ELP: 7.8 g/dL (ref 6.0–8.5)

## 2022-04-04 ENCOUNTER — Ambulatory Visit: Payer: Medicare HMO

## 2022-04-04 DIAGNOSIS — I495 Sick sinus syndrome: Secondary | ICD-10-CM

## 2022-04-04 LAB — CUP PACEART REMOTE DEVICE CHECK
Battery Impedance: 1283 Ohm
Battery Remaining Longevity: 51 mo
Battery Voltage: 2.77 V
Brady Statistic AP VP Percent: 24 %
Brady Statistic AP VS Percent: 76 %
Brady Statistic AS VP Percent: 0 %
Brady Statistic AS VS Percent: 0 %
Date Time Interrogation Session: 20240227090459
Implantable Lead Connection Status: 753985
Implantable Lead Connection Status: 753985
Implantable Lead Implant Date: 20080208
Implantable Lead Implant Date: 20140130
Implantable Lead Location: 753859
Implantable Lead Location: 753860
Implantable Lead Model: 5076
Implantable Lead Model: 5076
Implantable Pulse Generator Implant Date: 20140130
Lead Channel Impedance Value: 534 Ohm
Lead Channel Impedance Value: 575 Ohm
Lead Channel Pacing Threshold Amplitude: 0.625 V
Lead Channel Pacing Threshold Amplitude: 0.875 V
Lead Channel Pacing Threshold Pulse Width: 0.4 ms
Lead Channel Pacing Threshold Pulse Width: 0.4 ms
Lead Channel Setting Pacing Amplitude: 2 V
Lead Channel Setting Pacing Amplitude: 2.5 V
Lead Channel Setting Pacing Pulse Width: 0.4 ms
Lead Channel Setting Sensing Sensitivity: 2.8 mV
Zone Setting Status: 755011
Zone Setting Status: 755011

## 2022-04-11 ENCOUNTER — Other Ambulatory Visit: Payer: Self-pay

## 2022-04-11 ENCOUNTER — Inpatient Hospital Stay: Payer: Medicare HMO | Attending: Hematology | Admitting: Hematology

## 2022-04-11 VITALS — BP 134/70 | HR 91 | Temp 97.9°F | Resp 18 | Ht 69.5 in | Wt 212.7 lb

## 2022-04-11 DIAGNOSIS — I255 Ischemic cardiomyopathy: Secondary | ICD-10-CM | POA: Diagnosis not present

## 2022-04-11 DIAGNOSIS — D472 Monoclonal gammopathy: Secondary | ICD-10-CM | POA: Diagnosis not present

## 2022-04-11 DIAGNOSIS — N189 Chronic kidney disease, unspecified: Secondary | ICD-10-CM | POA: Insufficient documentation

## 2022-04-11 DIAGNOSIS — D649 Anemia, unspecified: Secondary | ICD-10-CM | POA: Insufficient documentation

## 2022-04-11 NOTE — Progress Notes (Signed)
[name]   HEMATOLOGY/ONCOLOGY CLINIC NOTE  Date of Service: 04/11/2022  Patient Care Team: [name], MD as PCP - General (Family Medicine) [name], Dani Gobble, MD as PCP - Cardiology (Cardiology) [name], Dani Gobble, MD as Consulting Physician (Cardiology) Lavonna Monarch, MD (Inactive) as Consulting Physician (Dermatology) Warren Danes, PA-C as Physician Assistant (Dermatology)  CHIEF COMPLAINTS/PURPOSE OF CONSULTATION:  Follow-up for evaluation and management of plasma cell dyscrasia and monoclonal paraproteinemia  HISTORY OF PRESENTING ILLNESS:  See previous note for details of initial presentation.  INTERVAL HISTORY: Alan [name] is a 82 y.o. male is here for continued valuation and management of her smoldering myeloma.   Patient was last seen by me on 11/22/2021 and he complained of chest tenderness in right pectoral, but was doing well overall.   Patient reports he has been doing well overall without any new medical concerns since our last visit. Patient's wife notes that the patient has been falling due to balance issues since our last visit.   He is taking Jardiance 10 mg to manage his diabetes. He has been tolerating his Jardiance well without any toxicities.   He denies fever, chills, night sweats, unexpected weight loss, abdominal pain, back pain, chest pain, or leg swelling. He reports of mild occasional back pain, but not severe.   Patient still complain of chest tenderness in right pectoral area. He went to the physician where he had an mammogram on 03/27/2022. The mammogram did not show any malignancy.    MEDICAL HISTORY:  Past Medical History:  Diagnosis Date   Anticoagulant long-term use    Arthritis    Arthropathy of left shoulder    Atypical mole    Biceps tendonitis, left    Bursitis of left shoulder    Cardiac pacemaker in situ    first insertion 2008;  genertor and new lead change 03-08-2012;   Medtronic   CHB (complete heart block) ([place])    s/p  PPM  2008   Chronic back pain    CKD (chronic kidney disease), stage III [facility])    Coronary artery disease cardiologist--- dr croitoru   hx  CABG x2  1990 and re-do 2004;  multiple PCI to [place] to 2004;    cardiac cath 2012  occluded LAD and RCA with patent grafts   Difficult intubation    " Dr. Sallyanne Kuster said that it was difficulty to get the tube in."   History of coronary angioplasty    multiple PCI to RCA ,  1990 to 2004   History of GI bleed followed by GI-- Olevia Perches [facility] [place]   01/ 2020  upper and lower GI bleed ;  s/p  EGD with cautery of jejunal bleed and blood transfusion's   History of kidney stones    Hx of echocardiogram 06/29/2008   EF 45-50%    Hypertension    Ischemic cardiomyopathy    previously reported , ef 40-45%;  2010--- ef 45-50%;   last echo 2017 55-60%   Mixed hyperlipidemia    Nocturia    OSA on CPAP    cpap, 12, last sleep study Jan2013, sees Dr. Keturah Barre   PAF (paroxysmal atrial fibrillation) ([place])    long hx PAF--- followed by dr croitoru   Post-polio syndrome    dx polio age 34;   09-30-2018  per pt occasional gait issues and occasion fall   Presence of permanent cardiac pacemaker    Pseudoarthrosis of lumbar spine  Rotator cuff tear, left    S/P CABG x 2    1990 x2  and 2004  x2   Sinus node dysfunction ([place])    2015--- sinus node arrest  with intact AV conduction   Squamous cell carcinoma of skin 06/14/2021   Mid Parietal Scalp (in situ) (tx p bx)   Type 2 diabetes mellitus ([place])    followed by pcp    SURGICAL HISTORY: Past Surgical History:  Procedure Laterality Date   APPENDECTOMY  child   APPLICATION OF INTRAOPERATIVE CT SCAN N/A 07/20/2020   Procedure: APPLICATION OF INTRAOPERATIVE CT SCAN;  Surgeon: [name], Theodoro Doing, DO;  Location: [place];  Service: Neurosurgery;  Laterality: N/A;   BLEPHAROPLASTY Bilateral 12/2014   upper eyelid   CARDIAC PACEMAKER PLACEMENT  2008   CARDIOVERSION N/A 01/15/2019    Procedure: CARDIOVERSION;  Surgeon: Sanda Klein, MD;  Location: [facility] ENDOSCOPY;  Service: Cardiovascular;  Laterality: N/A;   CARPAL TUNNEL RELEASE Bilateral 2015;  2016   CATARACT EXTRACTION W/ INTRAOCULAR LENS  IMPLANT, BILATERAL  2017   CORONARY ANGIOPLASTY  1990  to 2004   multiple to [place]    @ [place] (dr chitwood)   seq. LIMA to LAD and Diagonal   CORONARY ARTERY BYPASS GRAFT  2004     dr Amador Cunas   SVG to  RCA and PDA;  ALSO MAZE  PROCEDURE   ESOPHAGOGASTRODUODENOSCOPY Left 03/18/2013   Procedure: ESOPHAGOGASTRODUODENOSCOPY (EGD);  Surgeon: Cleotis Nipper, MD;  Location: [facility] ENDOSCOPY;  Service: Endoscopy;  Laterality: Left;   HARDWARE REMOVAL N/A 05/18/2016   Procedure: Removal of broken hardware Lumbar three-four;  Surgeon: [name], MD;  Location: [facility];  Service: Neurosurgery;  Laterality: N/A;   HERNIA REPAIR     LEFT HEART CATH AND CORS/GRAFTS ANGIOGRAPHY N/A 12/18/2018   Procedure: LEFT HEART CATH AND CORS/GRAFTS ANGIOGRAPHY;  Surgeon: Wellington Hampshire, MD;  Location: [facility] CV LAB;  Service: Cardiovascular;  Laterality: N/A;   LEFT HEART CATHETERIZATION WITH CORONARY/GRAFT ANGIOGRAM N/A 12/21/2010   Procedure: LEFT HEART CATHETERIZATION WITH Beatrix Fetters;  Surgeon: Leonie Man, MD;  Location: [facility] CATH LAB;  Service: Cardiovascular;  Laterality: N/A;   LEG SURGERY Right 2002  approx.   lengthened his leg   LUMBAR LAMINECTOMY/DECOMPRESSION MICRODISCECTOMY  01/10/2012   Procedure: LUMBAR LAMINECTOMY/DECOMPRESSION MICRODISCECTOMY 1 LEVEL;  Surgeon: [name], MD;  Location: [facility] NEURO ORS;  Service: Neurosurgery;  Laterality: Bilateral;  Lumbar three-four decompression, Posterior lateral fusion lumbar three-four, posterior spinus plate lumbar three-four   LUMBAR LAMINECTOMY/DECOMPRESSION MICRODISCECTOMY Left 05/18/2016   Procedure: Laminectomy and Foraminotomy - Lumbar two-lumbar three- Lumbar four-lumbar five-  left with removal hardware lumbar three-four;  Surgeon: [name], MD;  Location: [facility];  Service: Neurosurgery;  Laterality: Left;   NASAL SEPTUM SURGERY  yrs ago   PACEMAKER REVISION N/A 03/07/2012   Procedure: PACEMAKER REVISION;  Surgeon: Sanda Klein, MD;  Location: [facility] CATH LAB;  Service: Cardiovascular;  Laterality: N/A;   POSTERIOR LUMBAR FUSION  02-22-2017   dr Ronnald Ramp  @ [facility]   re-do laminectomy L2-3 and fusion   POSTERIOR LUMBAR FUSION  03-11-2018   dr Ronnald Ramp @ [facility]   laminectomy L4-5;  fixation L2-S1 and fusion L3-S1   SACROILIAC JOINT FUSION N/A 07/20/2020   Procedure: Sacroiliac Joint Fusion;  Surgeon: Karsten Ro, DO;  Location: [facility];  Service: Neurosurgery;  Laterality: N/A;   SHOULDER ARTHROSCOPY Right after 2016,  pt unsure year   SHOULDER ARTHROSCOPY WITH OPEN ROTATOR CUFF REPAIR Right 03/30/2014   Procedure: RIGHT SHOULDER ARTHROSCOPY  OPEN ROTATOR CUFF REPAIR;  Surgeon: Kerin Salen, MD;  Location: [facility];  Service: Orthopedics;  Laterality: Right;   SHOULDER ARTHROSCOPY WITH SUBACROMIAL DECOMPRESSION, ROTATOR CUFF REPAIR AND BICEP TENDON REPAIR Left 10/01/2018   Procedure: LEFT SHOULDER ARTHROSCOPY, DISTAL CLAVICLE EXCISION,SUBACROMIAL, DECOMPRESSION, RORATOR CUFF REPAIR, BICEPS TENODESIS;  Surgeon: Renette Butters, MD;  Location: [facility];  Service: Orthopedics;  Laterality: Left;   SHOULDER OPEN ROTATOR CUFF REPAIR Right 03/30/2014   Procedure: ROTATOR CUFF REPAIR SHOULDER OPEN;  Surgeon: Kerin Salen, MD;  Location: [facility];  Service: Orthopedics;  Laterality: Right;   TEE WITHOUT CARDIOVERSION N/A 01/15/2019   Procedure: TRANSESOPHAGEAL ECHOCARDIOGRAM (TEE);  Surgeon: Sanda Klein, MD;  Location: [facility] ENDOSCOPY;  Service: Cardiovascular;  Laterality: N/A;   TONSILLECTOMY  child   TOTAL KNEE ARTHROPLASTY Left 2006    SOCIAL HISTORY: Social History   Socioeconomic History   Marital status: Single    Spouse name: Not on file   Number of children: 1    Years of education: 16   Highest education level: Associate degree: academic program  Occupational History   Occupation: pilot-retired  Tobacco Use   Smoking status: Never   Smokeless tobacco: Never  Vaping Use   Vaping Use: Never used  Substance and Sexual Activity   Alcohol use: Not Currently   Drug use: Never   Sexual activity: Yes  Other Topics Concern   Not on file  Social History Narrative   Lives [address] [employer], [place].    Right-handed.   1 cup caffeine per day.   Social Determinants of Health   Financial Resource Strain: Low Risk  (02/04/2021)   Overall Financial Resource Strain (CARDIA)    Difficulty of Paying Living Expenses: Not hard at all  Food Insecurity: No Food Insecurity (02/04/2021)   Hunger Vital Sign    Worried About Running Out of Food in the Last Year: Never true    Ran Out of Food in the Last Year: Never true  Transportation Needs: No Transportation Needs (02/04/2021)   PRAPARE - Hydrologist (Medical): No    Lack of Transportation (Non-Medical): No  Physical Activity: Not on file  Stress: Not on file  Social Connections: Not on file  Intimate Partner Violence: Not on file  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 Non-smoker.  Rare alcohol use.   FAMILY HISTORY: Family History  Problem Relation Age of Onset   Other Father        MVA   Cancer Mother     ALLERGIES:  has No Known Allergies.  MEDICATIONS:  Current Outpatient Medications  Medication Sig Dispense Refill   acetaminophen (TYLENOL) 500 MG tablet Take 1,000 mg by mouth every 6 (six) hours as needed for mild pain or headache.     amiodarone (PACERONE) 200 MG tablet Take 200 mg by mouth daily.     carvedilol (COREG) 25 MG tablet TAKE 2 TABLETS BY MOUTH TWICE DAILY WITH A MEAL 300 tablet 3   clopidogrel (PLAVIX) 75 MG tablet Take 1 tablet (75 mg total) by mouth daily. 90 tablet 3   empagliflozin (JARDIANCE) 10 MG  TABS tablet Take 10 mg by mouth daily.     ferrous sulfate 325 (65 FE) MG EC tablet Take 325 mg by mouth 2 (two)  times daily.     glipiZIDE (GLUCOTROL) 10 MG tablet Take 10 mg by mouth 2 (two) times daily.     insulin lispro (HUMALOG) 100 UNIT/ML injection Inject 2-8 Units into the skin as needed for high blood sugar. Take as needed based on CBG value     Multiple Vitamin (MULTIVITAMIN WITH MINERALS) TABS tablet Take 1 tablet by mouth daily.      ONETOUCH ULTRA test strip USE 1 STRIP TO CHECK GLUCOSE 4 TIMES DAILY     pantoprazole (PROTONIX) 40 MG tablet Take 1 tablet (40 mg total) by mouth daily. 30 tablet 0   torsemide (DEMADEX) 10 MG tablet Take 5 mg by mouth daily.     No current facility-administered medications for this visit.    REVIEW OF SYSTEMS:   10 Point review of Systems was done is negative except as noted above.  PHYSICAL EXAMINATION: ECOG PERFORMANCE STATUS: 2 - Symptomatic, <50% confined to bed  . Vitals:   04/11/22 1154  BP: 134/70  Pulse: 91  Resp: 18  Temp: 97.9 F (36.6 C)  SpO2: 97%     Filed Weights   04/11/22 1154  Weight: 212 lb 11.2 oz (96.5 kg)     .Body mass index is 30.96 kg/m. NAD GENERAL:alert, in no acute distress and comfortable SKIN: no acute rashes, no significant lesions EYES: conjunctiva are pink and non-injected, sclera anicteric NECK: supple, no JVD LYMPH:  no palpable lymphadenopathy in the cervical, axillary or inguinal regions LUNGS: clear to auscultation b/l with normal respiratory effort HEART: regular rate & rhythm ABDOMEN:  normoactive bowel sounds , not distended. Right pectoral tenderness. Extremity: no pedal edema PSYCH: alert & oriented x 3 with fluent speech NEURO: no focal motor/sensory deficits  LABORATORY DATA:  I have reviewed the data as listed  .    Latest Ref Rng & Units 03/28/2022   11:14 AM 11/08/2021   12:48 PM 07/11/2021    9:35 AM  CBC  WBC 4.0 - 10.5 K/uL 6.6  4.8  5.6   Hemoglobin 13.0 - 17.0  g/dL 14.0  13.7  12.7   Hematocrit 39.0 - 52.0 % 41.6  41.0  38.2   Platelets 150 - 400 K/uL 92  100  100     .    Latest Ref Rng & Units 03/28/2022   11:14 AM 11/08/2021   12:48 PM 07/11/2021    9:35 AM  CMP  Glucose 70 - 99 mg/dL 151  140  129   BUN 8 - 23 mg/dL 36  30  31   Creatinine 0.61 - 1.24 mg/dL 1.81  1.81  1.76   Sodium 135 - 145 mmol/L 135  134  138   Potassium 3.5 - 5.1 mmol/L 4.6  4.1  4.4   Chloride 98 - 111 mmol/L 103  105  105   CO2 22 - 32 mmol/L '26  25  27   '$ Calcium 8.9 - 10.3 mg/dL 9.2  9.2  9.7   Total Protein 6.5 - 8.1 g/dL 8.1  7.8  8.0   Total Bilirubin 0.3 - 1.2 mg/dL 0.6  0.7  0.6   Alkaline Phos 38 - 126 U/L 73  72  96   AST 15 - 41 U/L '16  20  20   '$ ALT 0 - 44 U/L '17  22  22      '$ RADIOGRAPHIC STUDIES: I have personally reviewed the radiological images as listed and agreed with the findings in the report. CUP PACEART  REMOTE DEVICE CHECK  Result Date: 04/04/2022 Scheduled remote reviewed. Normal device function.  Next remote 91 days. 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 and less likely to the monoclonal paraproteinemia.  Also patient has ischemic cardiomyopathy which is likely explanation for his congestive heart failure along with decreasing diuretics and seems to be less likely related to AL amyloidosis clinically.  PLAN: -discussed lab results from today, 04/11/2022, with the patient. CMP shows slightly decreased Platelets of 92. CMP shows elevated creatinine at 1.81. Sedimentation rate is elevated at 32. LDH level is in the normal range at 120.   -Discussed multiple myeloma panel results from 03/28/2022 with the patient. Kappa/lambda light chain ration elevated at 2.07 but stable. M-Protein has improved from 2.0 to 1.8.  -Recommended to follow-up with PCP if his right pectoral chest is tender and is bothersome.  -Recommended using walking stick or cane to avoid falls. -Patient notes no acute new symptoms since his last clinic visit. -Patient has no clinical or lab findings suggestive of active myeloma at this time requiring treatment  FOLLOW-UP: Labs in 22 weeks RTC with Dr Irene Limbo in 24 weeks  The total time spent in the appointment was *** minutes* .  All of the patient's questions were answered with apparent satisfaction. The patient knows to call the clinic with any problems, questions or concerns.   Sullivan Lone MD MS AAHIVMS [facility] Hematology/Oncology Physician [facility]  .*Total Encounter Time as defined by the Centers for Medicare and Medicaid Services includes, in addition to the face-to-face time of a patient visit (documented in the note above) non-face-to-face time: obtaining and reviewing outside history, ordering and reviewing medications, tests or procedures, care coordination (communications with other health care professionals or caregivers) and documentation in the medical record.   Zettie Cooley, am acting as a Education administrator for Sullivan Lone, MD.

## 2022-05-04 ENCOUNTER — Ambulatory Visit: Payer: Medicare HMO | Admitting: Internal Medicine

## 2022-05-08 ENCOUNTER — Ambulatory Visit: Payer: Medicare HMO | Admitting: Internal Medicine

## 2022-05-08 ENCOUNTER — Telehealth (HOSPITAL_COMMUNITY): Payer: Self-pay | Admitting: Pharmacy Technician

## 2022-05-08 NOTE — Telephone Encounter (Signed)
Advanced Heart Failure Patient Advocate Encounter  The patient was approved for a [employer] grant that will help cover the cost of Jardiance, Coreg. Total amount awarded, $10,000. Eligibility, 04/11/22 - 04/10/23.  ID [id number]  BIN [id number]  PCN [id number]  Group [id number]  Called and left the patient a message. Emailed copy of grant information.  Charlann Boxer, CPhT

## 2022-05-10 NOTE — Progress Notes (Signed)
Remote pacemaker transmission.   

## 2022-05-12 MED ORDER — EMPAGLIFLOZIN 10 MG PO TABS: 10.0000 mg | ORAL_TABLET | Freq: Every day | ORAL | 3 refills | Status: DC

## 2022-05-12 NOTE — Addendum Note (Signed)
Addended by: Wanna Gully, Milagros Reap on: 05/12/2022 02:44 PM   Modules accepted: Orders

## 2022-05-12 NOTE — Telephone Encounter (Signed)
SCRIPT SENT  

## 2022-05-28 NOTE — Progress Notes (Signed)
HPI M never smoker followed for OSA, history UPPP. Complicated by Post polio, AFib/ pacemaker, CAD/CABG,  DM 2, HBP, gout,  DDD,      PCP Dr Cyndia Bent NPSG 05/30/97- AHI 20/ hr, desaturation to 78%, body weight 230 lbs Office Spirometry-02/26/2018- Mild Restriction of exhaled volume.  FVC 2.9/69%, FEV1 2.4/81%, ratio 1.84, FEF 25-75% 3.1/145%. ------------------------------------------------------------------------------  05/02/21- 80 YOM never smoker followed for OSA, history UPPP. Complicated by Post polio, AFib/ pacemaker/ Coumadin, CAD/CABG/ CHF,  DM 2, CKD4, HBP, gout, PCP Dr Cyndia Bent, Covid infection 02/11/20, ?MGUS, CPAP 12/ Adapt-AutoSet AirSense 10   also has an AirSense 11 Uses older machine mostly- Download-      Body weight today Covid vax-3 Phizer Flu vax-had -----Patient states that he needs the settings to be 12 on the new CPAP machine. Patient states that he is currently using old machine. He hadn't btyhered to get new machine out of box while old machine was working, but ready to change now. Wants to continue CPAP 12. Incidentally fell yesterday- R wrist swollen. He has an orthopedist and will get it seen to.   05/30/22- 81 YOM never smoker followed for OSA, history UPPP. Complicated by Post polio, AFib/ Pacemaker/ Coumadin, CAD/CABG/ CHF,  DM 2, CKD4, HBP, gout, PCP Dr Cyndia Bent, Covid infection 02/11/20, ?MGUS, CPAP 12/ Adapt-AutoSet AirSense 10   also has an AirSense 11 Download compliance - 100%, AHI 0.8/ hr Body weight today-208 lbs Download reviewed.  He is using CPAP regularly and realizes it helps but he admits he is "tired of it".  We discussed alternatives again.  We agreed that we would update his sleep study and refer him to meet providers of oral appliances and Inspire.  ROS-HPI  + = positive Constitutional:   No-   weight loss, night sweats, fevers, chills, fatigue, lassitude. HEENT:   No-  headaches, difficulty swallowing, tooth/dental problems, sore throat,       No-   sneezing, itching, ear ache, nasal congestion, post nasal drip,  CV:  No-   chest pain, orthopnea, PND, swelling in lower extremities, anasarca, dizziness, palpitations Resp: +shortness of breath with exertion or at rest,  cough            No-   No- coughing up of blood.              No-   change in color of mucus.  No- wheezing.   Skin: No-   rash or lesions. GI:  No-   heartburn, indigestion, abdominal pain, nausea, vomiting,  GU:  MS: + joint pain or swelling.  + back pain Neuro-     nothing unusual Psych:  No- change in mood or affect. No depression or anxiety.  No memory loss.  OBJ General- Alert, Oriented, Affect-appropriate, Distress- none acute, + overweight Skin- no rash Lymphadenopathy- none Head- atraumatic            Eyes- Gross vision intact, PERRLA, conjunctivae clear secretions            Ears- Hearing, canals-normal            Nose- Clear, no-Septal dev, mucus, polyps, erosion, perforation             Throat- Mallampati III/ +sp UPPP , mucosa clear , drainage- none, tonsils- atrophic Neck- flexible , trachea midline, no stridor , thyroid nl, carotid no bruit Chest - symmetrical excursion , unlabored           Heart/CV- RRR , no murmur , no  gallop  , no rub, nl s1 s2                           - JVD- none , edema- none, stasis changes- none, varices- none           Lung- +clear/ unlabored, wheeze- none, cough- none , dullness-none, rub- none           Chest wall-  + pacemaker L,  Abd- Br/ Gen/ Rectal- Not done, not indicated Extrem-  Neuro- grossly intact to observation

## 2022-05-30 ENCOUNTER — Encounter: Payer: Self-pay | Admitting: Internal Medicine

## 2022-05-30 ENCOUNTER — Ambulatory Visit (INDEPENDENT_AMBULATORY_CARE_PROVIDER_SITE_OTHER): Payer: Medicare HMO | Admitting: Internal Medicine

## 2022-05-30 VITALS — BP 136/82 | HR 88 | Ht 70.0 in | Wt 208.8 lb

## 2022-05-30 DIAGNOSIS — G4733 Obstructive sleep apnea (adult) (pediatric): Secondary | ICD-10-CM

## 2022-05-30 DIAGNOSIS — I5032 Chronic diastolic (congestive) heart failure: Secondary | ICD-10-CM | POA: Diagnosis not present

## 2022-05-30 NOTE — Patient Instructions (Signed)
Order- schedule HST off your CPAP         dx OSA  You can call us about 2 weeks after your sleep study for results    Order- refer to Dr Althea Grimmer, Orthodontist    consider oral appliance for OSA  Order- referral to Dr Christia Reading, ENT   consider [facility] for OSA

## 2022-06-15 ENCOUNTER — Ambulatory Visit: Payer: Medicare HMO | Admitting: Physician Assistant

## 2022-07-01 NOTE — Assessment & Plan Note (Signed)
Teen use cardiology follow-up and denies any recent acute problems.

## 2022-07-01 NOTE — Assessment & Plan Note (Signed)
Benefits from CPAP with good compliance and control but after many years now really wants to explore alternatives. Plan-refer to providers of oral appliance and Inspire to learn about these alternatives.

## 2022-07-04 ENCOUNTER — Ambulatory Visit (INDEPENDENT_AMBULATORY_CARE_PROVIDER_SITE_OTHER): Payer: Medicare HMO

## 2022-07-04 DIAGNOSIS — I495 Sick sinus syndrome: Secondary | ICD-10-CM

## 2022-07-04 LAB — CUP PACEART REMOTE DEVICE CHECK
Battery Impedance: 1366 Ohm
Battery Remaining Longevity: 48 mo
Battery Voltage: 2.77 V
Brady Statistic AP VP Percent: 22 %
Brady Statistic AP VS Percent: 78 %
Brady Statistic AS VP Percent: 0 %
Brady Statistic AS VS Percent: 0 %
Date Time Interrogation Session: 20240528071935
Implantable Lead Connection Status: 753985
Implantable Lead Connection Status: 753985
Implantable Lead Implant Date: 20080208
Implantable Lead Implant Date: 20140130
Implantable Lead Location: 753859
Implantable Lead Location: 753860
Implantable Lead Model: 5076
Implantable Lead Model: 5076
Implantable Pulse Generator Implant Date: 20140130
Lead Channel Impedance Value: 469 Ohm
Lead Channel Impedance Value: 576 Ohm
Lead Channel Pacing Threshold Amplitude: 0.625 V
Lead Channel Pacing Threshold Amplitude: 0.875 V
Lead Channel Pacing Threshold Pulse Width: 0.4 ms
Lead Channel Pacing Threshold Pulse Width: 0.4 ms
Lead Channel Setting Pacing Amplitude: 2 V
Lead Channel Setting Pacing Amplitude: 2.5 V
Lead Channel Setting Pacing Pulse Width: 0.4 ms
Lead Channel Setting Sensing Sensitivity: 2.8 mV
Zone Setting Status: 755011
Zone Setting Status: 755011

## 2022-07-26 NOTE — Progress Notes (Signed)
Remote pacemaker transmission.   

## 2022-09-05 ENCOUNTER — Inpatient Hospital Stay (HOSPITAL_COMMUNITY)
Admission: EM | Admit: 2022-09-05 | Discharge: 2022-09-08 | DRG: 552 | Disposition: A | Discharge status: Home Health | Payer: Medicare HMO | Attending: Family Medicine | Admitting: Family Medicine

## 2022-09-05 ENCOUNTER — Other Ambulatory Visit: Payer: Self-pay

## 2022-09-05 ENCOUNTER — Emergency Department (HOSPITAL_COMMUNITY): Payer: Medicare HMO

## 2022-09-05 ENCOUNTER — Encounter (HOSPITAL_COMMUNITY): Payer: Self-pay | Admitting: Emergency Medicine

## 2022-09-05 DIAGNOSIS — N62 Hypertrophy of breast: Secondary | ICD-10-CM | POA: Diagnosis present

## 2022-09-05 DIAGNOSIS — D696 Thrombocytopenia, unspecified: Secondary | ICD-10-CM | POA: Diagnosis present

## 2022-09-05 DIAGNOSIS — N184 Chronic kidney disease, stage 4 (severe): Secondary | ICD-10-CM

## 2022-09-05 DIAGNOSIS — I255 Ischemic cardiomyopathy: Secondary | ICD-10-CM | POA: Diagnosis present

## 2022-09-05 DIAGNOSIS — Z794 Long term (current) use of insulin: Secondary | ICD-10-CM

## 2022-09-05 DIAGNOSIS — Z950 Presence of cardiac pacemaker: Secondary | ICD-10-CM

## 2022-09-05 DIAGNOSIS — I130 Hypertensive heart and chronic kidney disease with heart failure and stage 1 through stage 4 chronic kidney disease, or unspecified chronic kidney disease: Secondary | ICD-10-CM | POA: Diagnosis present

## 2022-09-05 DIAGNOSIS — M549 Dorsalgia, unspecified: Secondary | ICD-10-CM | POA: Diagnosis present

## 2022-09-05 DIAGNOSIS — E782 Mixed hyperlipidemia: Secondary | ICD-10-CM | POA: Diagnosis present

## 2022-09-05 DIAGNOSIS — Z981 Arthrodesis status: Secondary | ICD-10-CM

## 2022-09-05 DIAGNOSIS — Z7901 Long term (current) use of anticoagulants: Secondary | ICD-10-CM

## 2022-09-05 DIAGNOSIS — Z7984 Long term (current) use of oral hypoglycemic drugs: Secondary | ICD-10-CM

## 2022-09-05 DIAGNOSIS — S12500A Unspecified displaced fracture of sixth cervical vertebra, initial encounter for closed fracture: Principal | ICD-10-CM | POA: Diagnosis present

## 2022-09-05 DIAGNOSIS — Z9841 Cataract extraction status, right eye: Secondary | ICD-10-CM

## 2022-09-05 DIAGNOSIS — S0003XA Contusion of scalp, initial encounter: Secondary | ICD-10-CM | POA: Diagnosis present

## 2022-09-05 DIAGNOSIS — T796XXA Traumatic ischemia of muscle, initial encounter: Secondary | ICD-10-CM | POA: Diagnosis present

## 2022-09-05 DIAGNOSIS — I2510 Atherosclerotic heart disease of native coronary artery without angina pectoris: Secondary | ICD-10-CM | POA: Diagnosis present

## 2022-09-05 DIAGNOSIS — Z96652 Presence of left artificial knee joint: Secondary | ICD-10-CM | POA: Diagnosis present

## 2022-09-05 DIAGNOSIS — S41111A Laceration without foreign body of right upper arm, initial encounter: Secondary | ICD-10-CM | POA: Diagnosis present

## 2022-09-05 DIAGNOSIS — G4733 Obstructive sleep apnea (adult) (pediatric): Secondary | ICD-10-CM

## 2022-09-05 DIAGNOSIS — D472 Monoclonal gammopathy: Secondary | ICD-10-CM | POA: Diagnosis present

## 2022-09-05 DIAGNOSIS — S129XXA Fracture of neck, unspecified, initial encounter: Secondary | ICD-10-CM

## 2022-09-05 DIAGNOSIS — Z961 Presence of intraocular lens: Secondary | ICD-10-CM | POA: Diagnosis present

## 2022-09-05 DIAGNOSIS — S2239XA Fracture of one rib, unspecified side, initial encounter for closed fracture: Secondary | ICD-10-CM | POA: Diagnosis present

## 2022-09-05 DIAGNOSIS — G14 Postpolio syndrome: Secondary | ICD-10-CM | POA: Diagnosis present

## 2022-09-05 DIAGNOSIS — I25708 Atherosclerosis of coronary artery bypass graft(s), unspecified, with other forms of angina pectoris: Secondary | ICD-10-CM | POA: Diagnosis not present

## 2022-09-05 DIAGNOSIS — I5032 Chronic diastolic (congestive) heart failure: Secondary | ICD-10-CM | POA: Diagnosis not present

## 2022-09-05 DIAGNOSIS — G8929 Other chronic pain: Secondary | ICD-10-CM | POA: Diagnosis present

## 2022-09-05 DIAGNOSIS — Z79899 Other long term (current) drug therapy: Secondary | ICD-10-CM

## 2022-09-05 DIAGNOSIS — Z9049 Acquired absence of other specified parts of digestive tract: Secondary | ICD-10-CM

## 2022-09-05 DIAGNOSIS — S2231XA Fracture of one rib, right side, initial encounter for closed fracture: Secondary | ICD-10-CM | POA: Diagnosis present

## 2022-09-05 DIAGNOSIS — I480 Paroxysmal atrial fibrillation: Secondary | ICD-10-CM | POA: Diagnosis present

## 2022-09-05 DIAGNOSIS — Z85828 Personal history of other malignant neoplasm of skin: Secondary | ICD-10-CM

## 2022-09-05 DIAGNOSIS — Z9842 Cataract extraction status, left eye: Secondary | ICD-10-CM

## 2022-09-05 DIAGNOSIS — N1832 Chronic kidney disease, stage 3b: Secondary | ICD-10-CM | POA: Diagnosis present

## 2022-09-05 DIAGNOSIS — Z87442 Personal history of urinary calculi: Secondary | ICD-10-CM

## 2022-09-05 DIAGNOSIS — Z23 Encounter for immunization: Secondary | ICD-10-CM

## 2022-09-05 DIAGNOSIS — Z9861 Coronary angioplasty status: Secondary | ICD-10-CM

## 2022-09-05 DIAGNOSIS — E1122 Type 2 diabetes mellitus with diabetic chronic kidney disease: Secondary | ICD-10-CM | POA: Diagnosis present

## 2022-09-05 LAB — CBC WITH DIFFERENTIAL/PLATELET
Abs Immature Granulocytes: 0.06 10*3/uL (ref 0.00–0.07)
Basophils Absolute: 0.1 10*3/uL (ref 0.0–0.1)
Basophils Relative: 1 %
Eosinophils Absolute: 0.2 10*3/uL (ref 0.0–0.5)
Eosinophils Relative: 1 %
HCT: 39.3 % (ref 39.0–52.0)
Hemoglobin: 12.7 g/dL — ABNORMAL LOW (ref 13.0–17.0)
Immature Granulocytes: 1 %
Lymphocytes Relative: 6 %
Lymphs Abs: 0.7 10*3/uL (ref 0.7–4.0)
MCH: 32.2 pg (ref 26.0–34.0)
MCHC: 32.3 g/dL (ref 30.0–36.0)
MCV: 99.7 fL (ref 80.0–100.0)
Monocytes Absolute: 0.7 10*3/uL (ref 0.1–1.0)
Monocytes Relative: 7 %
Neutro Abs: 9.2 10*3/uL — ABNORMAL HIGH (ref 1.7–7.7)
Neutrophils Relative %: 84 %
Platelets: 112 10*3/uL — ABNORMAL LOW (ref 150–400)
RBC: 3.94 MIL/uL — ABNORMAL LOW (ref 4.22–5.81)
RDW: 13.3 % (ref 11.5–15.5)
WBC: 10.9 10*3/uL — ABNORMAL HIGH (ref 4.0–10.5)
nRBC: 0 % (ref 0.0–0.2)

## 2022-09-05 LAB — COMPREHENSIVE METABOLIC PANEL
ALT: 28 U/L (ref 0–44)
AST: 42 U/L — ABNORMAL HIGH (ref 15–41)
Albumin: 3.6 g/dL (ref 3.5–5.0)
Alkaline Phosphatase: 72 U/L (ref 38–126)
Anion gap: 12 (ref 5–15)
BUN: 28 mg/dL — ABNORMAL HIGH (ref 8–23)
CO2: 22 mmol/L (ref 22–32)
Calcium: 9.2 mg/dL (ref 8.9–10.3)
Chloride: 100 mmol/L (ref 98–111)
Creatinine, Ser: 1.76 mg/dL — ABNORMAL HIGH (ref 0.61–1.24)
GFR, Estimated: 39 mL/min — ABNORMAL LOW (ref >60)
Glucose, Bld: 177 mg/dL — ABNORMAL HIGH (ref 70–99)
Potassium: 4.3 mmol/L (ref 3.5–5.1)
Sodium: 134 mmol/L — ABNORMAL LOW (ref 135–145)
Total Bilirubin: 0.7 mg/dL (ref 0.3–1.2)
Total Protein: 8.4 g/dL — ABNORMAL HIGH (ref 6.5–8.1)

## 2022-09-05 LAB — LIPASE, BLOOD: Lipase: 51 U/L (ref 11–51)

## 2022-09-05 LAB — I-STAT CG4 LACTIC ACID, ED: Lactic Acid, Venous: 2.3 mmol/L (ref 0.5–1.9)

## 2022-09-05 MED ORDER — POLYETHYLENE GLYCOL 3350 17 G PO PACK
17.0000 g | PACK | Freq: Every day | ORAL | Status: DC | PRN
  Administered 2022-09-07: 17 g via ORAL
  Filled 2022-09-05: qty 1

## 2022-09-05 MED ORDER — SODIUM CHLORIDE 0.9% FLUSH
3.0000 mL | Freq: Two times a day (BID) | INTRAVENOUS | Status: DC
  Administered 2022-09-06 – 2022-09-08 (×6): 3 mL via INTRAVENOUS

## 2022-09-05 MED ORDER — INSULIN ASPART 100 UNIT/ML IJ SOLN
0.0000 [IU] | INTRAMUSCULAR | Status: DC
  Administered 2022-09-06 – 2022-09-08 (×6): 1 [IU] via SUBCUTANEOUS

## 2022-09-05 MED ORDER — TETANUS-DIPHTH-ACELL PERTUSSIS 5-2.5-18.5 LF-MCG/0.5 IM SUSY
0.5000 mL | PREFILLED_SYRINGE | Freq: Once | INTRAMUSCULAR | Status: AC
  Administered 2022-09-05: 0.5 mL via INTRAMUSCULAR
  Filled 2022-09-05: qty 0.5

## 2022-09-05 MED ORDER — IOHEXOL 350 MG/ML SOLN
75.0000 mL | Freq: Once | INTRAVENOUS | Status: AC | PRN
  Administered 2022-09-05: 75 mL via INTRAVENOUS

## 2022-09-05 MED ORDER — ACETAMINOPHEN 650 MG RE SUPP: 650.0000 mg | Freq: Four times a day (QID) | RECTAL | Status: DC | PRN

## 2022-09-05 MED ORDER — ACETAMINOPHEN 325 MG PO TABS: 650.0000 mg | ORAL_TABLET | Freq: Four times a day (QID) | ORAL | Status: DC | PRN

## 2022-09-05 MED ORDER — OXYCODONE HCL 5 MG PO TABS
5.0000 mg | ORAL_TABLET | ORAL | Status: DC | PRN
  Administered 2022-09-06 – 2022-09-08 (×2): 5 mg via ORAL
  Filled 2022-09-05 (×3): qty 1

## 2022-09-05 MED ORDER — FENTANYL CITRATE PF 50 MCG/ML IJ SOSY
12.5000 ug | PREFILLED_SYRINGE | INTRAMUSCULAR | Status: DC | PRN
  Administered 2022-09-05 – 2022-09-06 (×2): 50 ug via INTRAVENOUS
  Administered 2022-09-06: 25 ug via INTRAVENOUS
  Administered 2022-09-07: 50 ug via INTRAVENOUS
  Filled 2022-09-05 (×4): qty 1

## 2022-09-05 MED ORDER — AMIODARONE HCL 200 MG PO TABS
200.0000 mg | ORAL_TABLET | Freq: Every day | ORAL | Status: DC
  Administered 2022-09-06 – 2022-09-08 (×3): 200 mg via ORAL
  Filled 2022-09-05 (×3): qty 1

## 2022-09-05 MED ORDER — FENTANYL CITRATE PF 50 MCG/ML IJ SOSY
50.0000 ug | PREFILLED_SYRINGE | Freq: Once | INTRAMUSCULAR | Status: AC
  Administered 2022-09-05: 50 ug via INTRAVENOUS
  Filled 2022-09-05: qty 1

## 2022-09-05 MED ORDER — SODIUM CHLORIDE 0.9 % IV BOLUS
1000.0000 mL | Freq: Once | INTRAVENOUS | Status: AC
  Administered 2022-09-05: 1000 mL via INTRAVENOUS

## 2022-09-05 MED ORDER — CARVEDILOL 25 MG PO TABS
25.0000 mg | ORAL_TABLET | Freq: Two times a day (BID) | ORAL | Status: DC
  Administered 2022-09-06 – 2022-09-08 (×6): 25 mg via ORAL
  Filled 2022-09-05 (×4): qty 1
  Filled 2022-09-05: qty 2
  Filled 2022-09-05: qty 1

## 2022-09-05 MED ORDER — BISACODYL 5 MG PO TBEC: 5.0000 mg | DELAYED_RELEASE_TABLET | Freq: Every day | ORAL | Status: DC | PRN

## 2022-09-05 NOTE — Progress Notes (Signed)
   09/05/22 2125  Spiritual Encounters  Type of Visit Initial  Care provided to: Family  Referral source Trauma page  Reason for visit Trauma  OnCall Visit No   Chaplain responded to a level two trauma. The patient was attended to by the medical team. I met with the family members of the patient. They were appreciative of my visit but said they were ok at the moment. I wished the well as I departed and advised them if they wished to see a chaplain later to let the nurse know.   [facility]  [phone]

## 2022-09-05 NOTE — ED Notes (Signed)
Trauma Response Nurse Documentation   Alan [name] is a 82 y.o. male arriving to [facility] ED via [facility]  On clopidogrel 75 mg daily. Trauma was activated as a Level 2 by Dr. Lockie Mola based on the following trauma criteria Elderly patients > 65 with head trauma on anti-coagulation (excluding ASA).  Patient cleared for CT by Dr. Lockie Mola. Pt transported to CT with trauma response nurse present to monitor. RN remained with the patient throughout their absence from the department for clinical observation.   GCS 15.  History   Past Medical History:  Diagnosis Date   Anticoagulant long-term use    Arthritis    Arthropathy of left shoulder    Atypical mole    Biceps tendonitis, left    Bursitis of left shoulder    Cardiac pacemaker in situ    first insertion 2008;  genertor and new lead change 03-08-2012;   [employer]   CHB (complete heart block) (HCC)    s/p  PPM 2008   Chronic back pain    CKD (chronic kidney disease), stage III (HCC)    Coronary artery disease cardiologist--- dr croitoru   hx  CABG x2  1990 and re-do 2004;  multiple PCI to RCA 1990 to 2004;    cardiac cath 2012  occluded LAD and RCA with patent grafts   Difficult intubation    " Dr. Royann Shivers said that it was difficulty to get the tube in."   History of coronary angioplasty    multiple PCI to RCA ,  1990 to 2004   History of GI bleed followed by GI-- Everette Rank PA @ [facility] in [place]   01/ 2020  upper and lower GI bleed ;  s/p  EGD with cautery of jejunal bleed and blood transfusion's   History of kidney stones    Hx of echocardiogram 06/29/2008   EF 45-50%    Hypertension    Ischemic cardiomyopathy    previously reported , ef 40-45%;  2010--- ef 45-50%;   last echo 2017 55-60%   Mixed hyperlipidemia    Nocturia    OSA on CPAP    cpap, 12, last sleep study Jan2013, sees Dr. Fannie Knee   PAF (paroxysmal atrial fibrillation) (HCC)    long hx PAF--- followed by dr croitoru    Post-polio syndrome    dx polio age 22;   09-30-2018  per pt occasional gait issues and occasion fall   Presence of permanent cardiac pacemaker    Pseudoarthrosis of lumbar spine    Rotator cuff tear, left    S/P CABG x 2    1990 x2  and 2004  x2   Sinus node dysfunction (HCC)    2015--- sinus node arrest  with intact AV conduction   Squamous cell carcinoma of skin 06/14/2021   Mid Parietal Scalp (in situ) (tx p bx)   Type 2 diabetes mellitus (HCC)    followed by pcp     Past Surgical History:  Procedure Laterality Date   APPENDECTOMY  child   APPLICATION OF INTRAOPERATIVE CT SCAN N/A 07/20/2020   Procedure: APPLICATION OF INTRAOPERATIVE CT SCAN;  Surgeon: [name], Alan Mulder, DO;  Location: [facility] OR;  Service: Neurosurgery;  Laterality: N/A;   BLEPHAROPLASTY Bilateral 12/2014   upper eyelid   CARDIAC PACEMAKER PLACEMENT  2008   CARDIOVERSION N/A 01/15/2019   Procedure: CARDIOVERSION;  Surgeon: Thurmon Fair, MD;  Location: [facility] ENDOSCOPY;  Service: Cardiovascular;  Laterality: N/A;   CARPAL  TUNNEL RELEASE Bilateral 2015;  2016   CATARACT EXTRACTION W/ INTRAOCULAR LENS  IMPLANT, BILATERAL  2017   CORONARY ANGIOPLASTY  1990  to 2004   multiple to RCA   CORONARY ARTERY BYPASS GRAFT  1990    @ [place] (dr chitwood)   seq. LIMA to LAD and Diagonal   CORONARY ARTERY BYPASS GRAFT  2004     dr Janey Genta   SVG to  RCA and PDA;  ALSO MAZE  PROCEDURE   ESOPHAGOGASTRODUODENOSCOPY Left 03/18/2013   Procedure: ESOPHAGOGASTRODUODENOSCOPY (EGD);  Surgeon: Florencia Reasons, MD;  Location: [facility] ENDOSCOPY;  Service: Endoscopy;  Laterality: Left;   HARDWARE REMOVAL N/A 05/18/2016   Procedure: Removal of broken hardware Lumbar three-four;  Surgeon: Tia Alert, MD;  Location: [facility] OR;  Service: Neurosurgery;  Laterality: N/A;   HERNIA REPAIR     LEFT HEART CATH AND CORS/GRAFTS ANGIOGRAPHY N/A 12/18/2018   Procedure: LEFT HEART CATH AND CORS/GRAFTS ANGIOGRAPHY;  Surgeon: Iran Ouch, MD;  Location: [facility]  INVASIVE CV LAB;  Service: Cardiovascular;  Laterality: N/A;   LEFT HEART CATHETERIZATION WITH CORONARY/GRAFT ANGIOGRAM N/A 12/21/2010   Procedure: LEFT HEART CATHETERIZATION WITH Isabel Caprice;  Surgeon: Marykay Lex, MD;  Location: [facility] CATH LAB;  Service: Cardiovascular;  Laterality: N/A;   LEG SURGERY Right 2002  approx.   lengthened his leg   LUMBAR LAMINECTOMY/DECOMPRESSION MICRODISCECTOMY  01/10/2012   Procedure: LUMBAR LAMINECTOMY/DECOMPRESSION MICRODISCECTOMY 1 LEVEL;  Surgeon: Tia Alert, MD;  Location: [facility] NEURO ORS;  Service: Neurosurgery;  Laterality: Bilateral;  Lumbar three-four decompression, Posterior lateral fusion lumbar three-four, posterior spinus plate lumbar three-four   LUMBAR LAMINECTOMY/DECOMPRESSION MICRODISCECTOMY Left 05/18/2016   Procedure: Laminectomy and Foraminotomy - Lumbar two-lumbar three- Lumbar four-lumbar five- left with removal hardware lumbar three-four;  Surgeon: Tia Alert, MD;  Location: [facility] OR;  Service: Neurosurgery;  Laterality: Left;   NASAL SEPTUM SURGERY  yrs ago   PACEMAKER REVISION N/A 03/07/2012   Procedure: PACEMAKER REVISION;  Surgeon: Thurmon Fair, MD;  Location: [facility] CATH LAB;  Service: Cardiovascular;  Laterality: N/A;   POSTERIOR LUMBAR FUSION  02-22-2017   dr Yetta Barre  @ [facility]   re-do laminectomy L2-3 and fusion   POSTERIOR LUMBAR FUSION  03-11-2018   dr Yetta Barre @ [facility]   laminectomy L4-5;  fixation L2-S1 and fusion L3-S1   SACROILIAC JOINT FUSION N/A 07/20/2020   Procedure: Sacroiliac Joint Fusion;  Surgeon: Bethann Goo, DO;  Location: [facility] OR;  Service: Neurosurgery;  Laterality: N/A;   SHOULDER ARTHROSCOPY Right after 2016, pt unsure year   SHOULDER ARTHROSCOPY WITH OPEN ROTATOR CUFF REPAIR Right 03/30/2014   Procedure: RIGHT SHOULDER ARTHROSCOPY  OPEN ROTATOR CUFF REPAIR;  Surgeon: Nestor Lewandowsky, MD;  Location: [facility] OR;  Service: Orthopedics;  Laterality: Right;   SHOULDER ARTHROSCOPY WITH SUBACROMIAL DECOMPRESSION, ROTATOR CUFF  REPAIR AND BICEP TENDON REPAIR Left 10/01/2018   Procedure: LEFT SHOULDER ARTHROSCOPY, DISTAL CLAVICLE EXCISION,SUBACROMIAL, DECOMPRESSION, RORATOR CUFF REPAIR, BICEPS TENODESIS;  Surgeon: Sheral Apley, MD;  Location: [facility] [place];  Service: Orthopedics;  Laterality: Left;   SHOULDER OPEN ROTATOR CUFF REPAIR Right 03/30/2014   Procedure: ROTATOR CUFF REPAIR SHOULDER OPEN;  Surgeon: Nestor Lewandowsky, MD;  Location: [facility] OR;  Service: Orthopedics;  Laterality: Right;   TEE WITHOUT CARDIOVERSION N/A 01/15/2019   Procedure: TRANSESOPHAGEAL ECHOCARDIOGRAM (TEE);  Surgeon: Thurmon Fair, MD;  Location: [facility] ENDOSCOPY;  Service: Cardiovascular;  Laterality: N/A;   TONSILLECTOMY  child   TOTAL KNEE ARTHROPLASTY Left 2006  Initial Focused Assessment (If applicable, or please see trauma documentation): Airway-- intact, no visible obstruction Breathing-- spontaneous, unlabored Circulation-- hematoma and abrasion to back of the head, bleeding controlled  CT's Completed:   CT Head, CT Maxillofacial, CT C-Spine, CT Chest w/ contrast, and CT abdomen/pelvis w/ contrast, CT L-spine, CT T-spine   Interventions:  See event summary  Plan for disposition:  Admission to floor   Consults completed:  {Trauma Consults:26862} at ***.  Event Summary: Patient brought in by [facility]. Patient was mowing and fell of lawnmower, down an embankment. Denies loss of consciousness. On arrival patient alert and oriented x4, GCS 15. Patien twith hematoma to back of the head, skin tear to right arm, skin tear left elbow. Lab work obtained. Level activated after arrival, initially was unknown that the patient in on Plavix. Xray chest, pelvis, left shoulder, right knee completed. Patient to CT with TRN and Primary RN. CT head, maxillofacial, c-spine, chest/abdomen/pelvis, L-spine, T-spine completed  MTP Summary (If applicable):  N/A  Bedside handoff with ED RN Whitney/Abby.    Leota Sauers   Trauma Response RN  Please call TRN at [phone] for further assistance.

## 2022-09-05 NOTE — Progress Notes (Signed)
Orthopedic Tech Progress Note Patient Details:  MARRELL TRUCKS [birth year] [id number]  Patient ID: [name], male   DOB: September 20, 1940, 82 y.o.   MRN: [id number] Level 2 trauma, not needed. Al Decant 09/05/2022, 9:23 PM

## 2022-09-05 NOTE — ED Provider Notes (Signed)
[place] EMERGENCY DEPARTMENT AT [facility] Provider Note   CSN: [id number] Arrival date & time: 09/05/22  2029     History  Chief Complaint  Patient presents with   Trauma    Alan [name] is a 82 y.o. male.  Patient arrives as a trauma.  He was mowing his lawn on a sitdown lawnmower when he rolled over the lawnmower into a creek/embankment.  Lawnmower was trapped on top of him for a little bit but he was able to crawl out and able to crawl back towards the road where car saw him.  He lives by himself.  He is not on blood thinners.  Per EMS he has a large hematoma to the back of his head.  He is got a skin tear to his right arm.  He is not having any specific joint pain.  He is having abdominal pain, mostly head pain.  Denies loss of consciousness.  Does look like he probably takes Plavix but he denies being on any blood thinner otherwise.  History of diabetes.  The history is provided by the patient and the EMS personnel.       Home Medications Prior to Admission medications   Medication Sig Start Date End Date Taking? Authorizing Provider  acetaminophen (TYLENOL) 500 MG tablet Take 1,000 mg by mouth every 6 (six) hours as needed for mild pain or headache.    [provider]  amiodarone (PACERONE) 200 MG tablet Take 200 mg by mouth daily.    [provider]  carvedilol (COREG) 25 MG tablet TAKE 2 TABLETS BY MOUTH TWICE DAILY WITH A MEAL 07/26/21   Laurey Morale, MD  clopidogrel (PLAVIX) 75 MG tablet Take 1 tablet (75 mg total) by mouth daily. 05/10/21   Laurey Morale, MD  empagliflozin (JARDIANCE) 10 MG TABS tablet Take 1 tablet (10 mg total) by mouth daily. 05/12/22   Laurey Morale, MD  ferrous sulfate 325 (65 FE) MG EC tablet Take 325 mg by mouth 2 (two) times daily.    [provider]  glipiZIDE (GLUCOTROL) 10 MG tablet Take 10 mg by mouth 2 (two) times daily.    [provider]  insulin lispro (HUMALOG) 100 UNIT/ML injection  Inject 2-8 Units into the skin as needed for high blood sugar. Take as needed based on CBG value    [provider]  Multiple Vitamin (MULTIVITAMIN WITH MINERALS) TABS tablet Take 1 tablet by mouth daily.     [provider]  ONETOUCH ULTRA test strip USE 1 STRIP TO CHECK GLUCOSE 4 TIMES DAILY 02/17/21   [provider]  pantoprazole (PROTONIX) 40 MG tablet Take 1 tablet (40 mg total) by mouth daily. 02/04/21 04/08/22  Joseph Art, DO  torsemide (DEMADEX) 10 MG tablet Take 5 mg by mouth daily.    [provider]      Allergies    Patient has no known allergies.    Review of Systems   Review of Systems  Physical Exam Updated Vital Signs BP (!) 170/63   Pulse 63   Temp 97.7 F (36.5 C) (Oral)   Resp (!) 21   SpO2 97%  Physical Exam Vitals and nursing note reviewed.  Constitutional:      General: He is not in acute distress.    Appearance: He is well-developed.  HENT:     Head:     Comments: Large posterior scalp hematoma with no obvious laceration, skin tear  Right Ear: Tympanic membrane normal.     Left Ear: Tympanic membrane normal.     Nose: Nose normal.     Mouth/Throat:     Mouth: Mucous membranes are moist.  Eyes:     Extraocular Movements: Extraocular movements intact.     Conjunctiva/sclera: Conjunctivae normal.     Pupils: Pupils are equal, round, and reactive to light.  Neck:     Comments: In cervical collar, tender in lower cervical spine Cardiovascular:     Rate and Rhythm: Normal rate and regular rhythm.     Heart sounds: No murmur heard. Pulmonary:     Effort: Pulmonary effort is normal. No respiratory distress.     Breath sounds: Normal breath sounds.  Abdominal:     Palpations: Abdomen is soft.     Tenderness: There is no abdominal tenderness.  Musculoskeletal:        General: No swelling.     Cervical back: Neck supple. Tenderness present.     Comments: No midline spinal pain  Skin:    General: Skin is warm  and dry.     Capillary Refill: Capillary refill takes less than 2 seconds.     Comments: Skin tear to the right forearm, abrasion to the left posterior leg  Neurological:     General: No focal deficit present.     Mental Status: He is alert and oriented to person, place, and time.     Cranial Nerves: No cranial nerve deficit.     Sensory: No sensory deficit.     Motor: No weakness.     Coordination: Coordination normal.  Psychiatric:        Mood and Affect: Mood normal.     ED Results / Procedures / Treatments   Labs (all labs ordered are listed, but only abnormal results are displayed) Labs Reviewed  CBC WITH DIFFERENTIAL/PLATELET - Abnormal; Notable for the following components:      Result Value   WBC 10.9 (*)    RBC 3.94 (*)    Hemoglobin 12.7 (*)    Platelets 112 (*)    Neutro Abs 9.2 (*)    All other components within normal limits  COMPREHENSIVE METABOLIC PANEL - Abnormal; Notable for the following components:   Sodium 134 (*)    Glucose, Bld 177 (*)    BUN 28 (*)    Creatinine, Ser 1.76 (*)    Total Protein 8.4 (*)    AST 42 (*)    GFR, Estimated 39 (*)    All other components within normal limits  I-STAT CG4 LACTIC ACID, ED - Abnormal; Notable for the following components:   Lactic Acid, Venous 2.3 (*)    All other components within normal limits  LIPASE, BLOOD  I-STAT CG4 LACTIC ACID, ED    EKG None  Radiology CT CHEST ABDOMEN PELVIS W CONTRAST  Result Date: 09/05/2022 CLINICAL DATA:  Polytrauma, blunt.  History of multiple myeloma. EXAM: CT CHEST, ABDOMEN, AND PELVIS WITH CONTRAST TECHNIQUE: Multidetector CT imaging of the chest, abdomen and pelvis was performed following the standard protocol during bolus administration of intravenous contrast. RADIATION DOSE REDUCTION: This exam was performed according to the departmental dose-optimization program which includes automated exposure control, adjustment of the mA and/or kV according to patient size and/or  use of iterative reconstruction technique. CONTRAST:  75mL OMNIPAQUE IOHEXOL 350 MG/ML SOLN COMPARISON:  Head CT 07/11/2021 FINDINGS: CHEST: Cardiovascular: Left chest wall triple lead pacemaker. No aortic injury. The thoracic aorta is normal in  caliber. The heart is enlarged in size. No significant pericardial effusion. Mitral annular calcification. 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 Atherosclerotic plaque. No central pulmonary embolus. The main pulmonary artery is enlarged measuring up to 3.3 cm. Mediastinum/Nodes: No pneumomediastinum. No mediastinal hematoma. The esophagus is unremarkable. The thyroid is unremarkable. The central airways are patent. No mediastinal, hilar, or axillary lymphadenopathy. Lungs/Pleura: Bilateral lower lobe subsegmental atelectasis. No focal consolidation. No pulmonary nodule. No pulmonary mass. No pulmonary contusion or laceration. No pneumatocele formation. No pleural effusion. No pneumothorax. No hemothorax. Musculoskeletal/Chest wall: No chest wall mass. Interval development of asymmetric right greater than left gynecomastia. Acute displaced right posterolateral sixth rib fracture. No acute sternal fracture. No spinal fracture. ABDOMEN / PELVIS: Hepatobiliary: Not enlarged. No focal lesion. No laceration or subcapsular hematoma. The gallbladder is otherwise unremarkable with no radio-opaque gallstones. No biliary ductal dilatation. Pancreas: Normal pancreatic contour. No main pancreatic duct dilatation. Spleen: Not enlarged. No focal lesion. No laceration, subcapsular hematoma, or vascular injury. Adrenals/Urinary Tract: No nodularity bilaterally. Bilateral kidneys enhance symmetrically. No hydronephrosis. No contusion, laceration, or subcapsular hematoma. Fluid density lesions within the kidneys likely represent simple renal cysts. Simple renal cysts, in the absence of clinically indicated signs/symptoms, require no independent  follow-up. No injury to the vascular structures or collecting systems. No hydroureter. The urinary bladder is unremarkable. Stomach/Bowel: No small or large bowel wall thickening or dilatation. Colonic diverticulosis. The appendix is not definitely identified with no inflammatory changes in the right lower quadrant to suggest acute appendicitis. Vasculature/Lymphatics: No abdominal aorta or iliac aneurysm. No active contrast extravasation or pseudoaneurysm. No abdominal, pelvic, inguinal lymphadenopathy. Reproductive: Enlarged prostate measuring up to 4.8 cm. Other: No simple free fluid ascites. No pneumoperitoneum. No hemoperitoneum. No mesenteric hematoma identified. No organized fluid collection. Musculoskeletal: No significant soft tissue hematoma. No acute pelvic fracture. No spinal fracture. Ports and Devices: None. IMPRESSION: 1. Acute displaced right posterolateral sixth rib fracture. No associated pneumothorax. 2. No acute intrathoracic, intra-abdominal, intrapelvic traumatic injury. 3. No acute fracture or traumatic malalignment of the thoracic or lumbar spine. Other imaging findings of potential clinical significance: 1. Interval development of asymmetric right greater than left gynecomastia. Recommend mammographic evaluation. Underlying malignancy not excluded. 2. Prostatomegaly. 3. Colonic diverticulosis with no acute diverticulitis. 4. Cardiomegaly. 5. Enlarged main pulmonary artery suggestive of pulmonary hypertension. These images were dictated when made available. These results were called by telephone at the time of interpretation on 09/05/2022 at 10:33 pm to provider Mariamawit Depaoli , who verbally acknowledged these results. Electronically Signed   By: Tish Frederickson M.D.   On: 09/05/2022 22:48   CT HEAD WO CONTRAST  Addendum Date: 09/05/2022   ADDENDUM REPORT: 09/05/2022 22:35 ADDENDUM: These results were called by telephone at the time of interpretation on 09/05/2022 at 10:33 pm to provider  Elon Eoff , who verbally acknowledged these results. Electronically Signed   By: Tish Frederickson M.D.   On: 09/05/2022 22:35   Result Date: 09/05/2022 CLINICAL DATA:  Head trauma, moderate-severe; Polytrauma, blunt; Facial trauma, blunt EXAM: CT HEAD WITHOUT CONTRAST CT MAXILLOFACIAL WITHOUT CONTRAST CT CERVICAL SPINE WITHOUT CONTRAST TECHNIQUE: Multidetector CT imaging of the head, cervical spine, and maxillofacial structures were performed using the standard protocol without intravenous contrast. Multiplanar CT image reconstructions of the cervical spine and maxillofacial structures were also generated. RADIATION DOSE REDUCTION: This exam was performed according to the departmental dose-optimization program which includes automated exposure control, adjustment of the mA and/or kV according to patient size  and/or use of iterative reconstruction technique. COMPARISON:  None Available. FINDINGS: CT HEAD FINDINGS Brain: Cerebral ventricle sizes are concordant with the degree of cerebral volume loss. No evidence of large-territorial acute infarction. No parenchymal hemorrhage. No mass lesion. No extra-axial collection. No mass effect or midline shift. No hydrocephalus. Basilar cisterns are patent. Vascular: No hyperdense vessel. Atherosclerotic calcifications are present within the cavernous internal carotid and vertebral arteries. Skull: No acute fracture or focal lesion. Other: Left scalp large hematoma formation measuring up to 2.1 cm. CT MAXILLOFACIAL FINDINGS Osseous: No fracture or mandibular dislocation. No destructive process. Sinuses/Orbits: Paranasal sinuses and mastoid air cells are clear. Bilateral lens replacement. The orbits are unremarkable. Soft tissues: Negative. CT CERVICAL SPINE FINDINGS Alignment: Normal. Skull base and vertebrae: Multilevel moderate severe degenerative changes spine with associated moderate severe osseous neural foraminal stenosis at the right C3-C4, C4-C5 levels. Acute  displaced and comminuted C6 spinous process fracture. No aggressive appearing focal osseous lesion or focal pathologic process. Soft tissues and spinal canal: No prevertebral fluid or swelling. No visible canal hematoma. Upper chest: Unremarkable. Other: Likely posterior nuchal hematoma. IMPRESSION: 1. No acute intracranial abnormality. 2. No acute displaced facial fracture. 3. Acute displaced and comminuted C6 spinous process fracture. Associated likely nuchal hematoma. Recommend MRI for further evaluation of the ligaments. 4. Large left scalp hematoma measuring up to 2.1 cm. These images were dictated when made available. Electronically Signed: By: Tish Frederickson M.D. On: 09/05/2022 22:26   CT MAXILLOFACIAL WO CONTRAST  Addendum Date: 09/05/2022   ADDENDUM REPORT: 09/05/2022 22:35 ADDENDUM: These results were called by telephone at the time of interpretation on 09/05/2022 at 10:33 pm to provider Tashunda Vandezande , who verbally acknowledged these results. Electronically Signed   By: Tish Frederickson M.D.   On: 09/05/2022 22:35   Result Date: 09/05/2022 CLINICAL DATA:  Head trauma, moderate-severe; Polytrauma, blunt; Facial trauma, blunt EXAM: CT HEAD WITHOUT CONTRAST CT MAXILLOFACIAL WITHOUT CONTRAST CT CERVICAL SPINE WITHOUT CONTRAST TECHNIQUE: Multidetector CT imaging of the head, cervical spine, and maxillofacial structures were performed using the standard protocol without intravenous contrast. Multiplanar CT image reconstructions of the cervical spine and maxillofacial structures were also generated. RADIATION DOSE REDUCTION: This exam was performed according to the departmental dose-optimization program which includes automated exposure control, adjustment of the mA and/or kV according to patient size and/or use of iterative reconstruction technique. COMPARISON:  None Available. FINDINGS: CT HEAD FINDINGS Brain: Cerebral ventricle sizes are concordant with the degree of cerebral volume loss. No evidence of  large-territorial acute infarction. No parenchymal hemorrhage. No mass lesion. No extra-axial collection. No mass effect or midline shift. No hydrocephalus. Basilar cisterns are patent. Vascular: No hyperdense vessel. Atherosclerotic calcifications are present within the cavernous internal carotid and vertebral arteries. Skull: No acute fracture or focal lesion. Other: Left scalp large hematoma formation measuring up to 2.1 cm. CT MAXILLOFACIAL FINDINGS Osseous: No fracture or mandibular dislocation. No destructive process. Sinuses/Orbits: Paranasal sinuses and mastoid air cells are clear. Bilateral lens replacement. The orbits are unremarkable. Soft tissues: Negative. CT CERVICAL SPINE FINDINGS Alignment: Normal. Skull base and vertebrae: Multilevel moderate severe degenerative changes spine with associated moderate severe osseous neural foraminal stenosis at the right C3-C4, C4-C5 levels. Acute displaced and comminuted C6 spinous process fracture. No aggressive appearing focal osseous lesion or focal pathologic process. Soft tissues and spinal canal: No prevertebral fluid or swelling. No visible canal hematoma. Upper chest: Unremarkable. Other: Likely posterior nuchal hematoma. IMPRESSION: 1. No acute intracranial abnormality. 2. No acute displaced  facial fracture. 3. Acute displaced and comminuted C6 spinous process fracture. Associated likely nuchal hematoma. Recommend MRI for further evaluation of the ligaments. 4. Large left scalp hematoma measuring up to 2.1 cm. These images were dictated when made available. Electronically Signed: By: Tish Frederickson M.D. On: 09/05/2022 22:26   CT CERVICAL SPINE WO CONTRAST  Addendum Date: 09/05/2022   ADDENDUM REPORT: 09/05/2022 22:35 ADDENDUM: These results were called by telephone at the time of interpretation on 09/05/2022 at 10:33 pm to provider Jenna Routzahn , who verbally acknowledged these results. Electronically Signed   By: Tish Frederickson M.D.   On: 09/05/2022  22:35   Result Date: 09/05/2022 CLINICAL DATA:  Head trauma, moderate-severe; Polytrauma, blunt; Facial trauma, blunt EXAM: CT HEAD WITHOUT CONTRAST CT MAXILLOFACIAL WITHOUT CONTRAST CT CERVICAL SPINE WITHOUT CONTRAST TECHNIQUE: Multidetector CT imaging of the head, cervical spine, and maxillofacial structures were performed using the standard protocol without intravenous contrast. Multiplanar CT image reconstructions of the cervical spine and maxillofacial structures were also generated. RADIATION DOSE REDUCTION: This exam was performed according to the departmental dose-optimization program which includes automated exposure control, adjustment of the mA and/or kV according to patient size and/or use of iterative reconstruction technique. COMPARISON:  None Available. FINDINGS: CT HEAD FINDINGS Brain: Cerebral ventricle sizes are concordant with the degree of cerebral volume loss. No evidence of large-territorial acute infarction. No parenchymal hemorrhage. No mass lesion. No extra-axial collection. No mass effect or midline shift. No hydrocephalus. Basilar cisterns are patent. Vascular: No hyperdense vessel. Atherosclerotic calcifications are present within the cavernous internal carotid and vertebral arteries. Skull: No acute fracture or focal lesion. Other: Left scalp large hematoma formation measuring up to 2.1 cm. CT MAXILLOFACIAL FINDINGS Osseous: No fracture or mandibular dislocation. No destructive process. Sinuses/Orbits: Paranasal sinuses and mastoid air cells are clear. Bilateral lens replacement. The orbits are unremarkable. Soft tissues: Negative. CT CERVICAL SPINE FINDINGS Alignment: Normal. Skull base and vertebrae: Multilevel moderate severe degenerative changes spine with associated moderate severe osseous neural foraminal stenosis at the right C3-C4, C4-C5 levels. Acute displaced and comminuted C6 spinous process fracture. No aggressive appearing focal osseous lesion or focal pathologic process.  Soft tissues and spinal canal: No prevertebral fluid or swelling. No visible canal hematoma. Upper chest: Unremarkable. Other: Likely posterior nuchal hematoma. IMPRESSION: 1. No acute intracranial abnormality. 2. No acute displaced facial fracture. 3. Acute displaced and comminuted C6 spinous process fracture. Associated likely nuchal hematoma. Recommend MRI for further evaluation of the ligaments. 4. Large left scalp hematoma measuring up to 2.1 cm. These images were dictated when made available. Electronically Signed: By: Tish Frederickson M.D. On: 09/05/2022 22:26   CT L-SPINE NO CHARGE  Result Date: 09/05/2022 CLINICAL DATA:  [id number] Pain [id number] EXAM: CT THORACIC AND LUMBAR SPINE WITHOUT CONTRAST TECHNIQUE: Multidetector CT imaging of the thoracic and lumbar spine was performed without contrast. Multiplanar CT image reconstructions were also generated. RADIATION DOSE REDUCTION: This exam was performed according to the departmental dose-optimization program which includes automated exposure control, adjustment of the mA and/or kV according to patient size and/or use of iterative reconstruction technique. COMPARISON:  PetCT 07/11/2021, CT lumbar spine 09/27/2018 FINDINGS: CT THORACIC SPINE FINDINGS Alignment: Normal. Vertebrae: Multilevel moderate degenerative changes spine most prominent at the T7-T8 level. No acute fracture or focal pathologic process. Paraspinal and other soft tissues: Negative. Disc levels: Maintained. CT LUMBAR SPINE FINDINGS Segmentation: 5 lumbar type vertebrae. Alignment: Straightening of normal lumbar lordosis likely due to surgical hardware and degenerative changes. Vertebrae: Multilevel severe  degenerative changes of the spine. Status post laminectomy at the L3 through L5 levels with associated chronic similar-appearing cortical erosion and destruction. Interbody surgical hardware fusion at the L2-L3, L3-L4, L4-L5 levels. Bilateral posterior L2 surgical hardware. Posterior L3 surgical  hardware removed. Posterior L4 surgical hardware partially removed with retained partial screws. Posterior left S1 screws removed. Posterior right S1 surgical hardware partially removed with partial retained screw. Right sacroiliac joint surgical hardware. No acute fracture or focal pathologic process. Paraspinal and other soft tissues: Negative. Disc levels: Non-surgerized levels demonstrate at least moderate intervertebral disc space narrowing. IMPRESSION: CT THORACIC SPINE IMPRESSION 1. No acute displaced fracture or traumatic listhesis of the thoracic spine. CT LUMBAR SPINE IMPRESSION 1. No acute displaced fracture or traumatic listhesis of the lumbar spine. 2. Lumbosacral surgical hardware with partial removal of the surgical hardware. 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 Electronically Signed   By: Tish Frederickson M.D.   On: 09/05/2022 22:33   CT T-SPINE NO CHARGE  Result Date: 09/05/2022 CLINICAL DATA:  [id number] Pain [id number] EXAM: CT THORACIC AND LUMBAR SPINE WITHOUT CONTRAST TECHNIQUE: Multidetector CT imaging of the thoracic and lumbar spine was performed without contrast. Multiplanar CT image reconstructions were also generated. RADIATION DOSE REDUCTION: This exam was performed according to the departmental dose-optimization program which includes automated exposure control, adjustment of the mA and/or kV according to patient size and/or use of iterative reconstruction technique. COMPARISON:  PetCT 07/11/2021, CT lumbar spine 09/27/2018 FINDINGS: CT THORACIC SPINE FINDINGS Alignment: Normal. Vertebrae: Multilevel moderate degenerative changes spine most prominent at the T7-T8 level. No acute fracture or focal pathologic process. Paraspinal and other soft tissues: Negative. Disc levels: Maintained. CT LUMBAR SPINE FINDINGS Segmentation: 5 lumbar type vertebrae. Alignment: Straightening of normal lumbar  lordosis likely due to surgical hardware and degenerative changes. Vertebrae: Multilevel severe degenerative changes of the spine. Status post laminectomy at the L3 through L5 levels with associated chronic similar-appearing cortical erosion and destruction. Interbody surgical hardware fusion at the L2-L3, L3-L4, L4-L5 levels. Bilateral posterior L2 surgical hardware. Posterior L3 surgical hardware removed. Posterior L4 surgical hardware partially removed with retained partial screws. Posterior left S1 screws removed. Posterior right S1 surgical hardware partially removed with partial retained screw. Right sacroiliac joint surgical hardware. No acute fracture or focal pathologic process. Paraspinal and other soft tissues: Negative. Disc levels: Non-surgerized levels demonstrate at least moderate intervertebral disc space narrowing. IMPRESSION: CT THORACIC SPINE IMPRESSION 1. No acute displaced fracture or traumatic listhesis of the thoracic spine. CT LUMBAR SPINE IMPRESSION 1. No acute displaced fracture or traumatic listhesis of the lumbar spine. 2. Lumbosacral surgical hardware with partial removal of the surgical hardware. 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 Electronically Signed   By: Tish Frederickson M.D.   On: 09/05/2022 22:33   DG Shoulder Left  Result Date: 09/05/2022 CLINICAL DATA:  pain EXAM: LEFT SHOULDER - 2+ VIEW COMPARISON:  CT chest 09/05/2022, PET CT 07/11/2021 FINDINGS: There is no evidence of fracture or dislocation. Mild to moderate degenerative changes of the glenohumeral joint. Mild degenerative changes acromioclavicular joint. Soft tissues are unremarkable. Atherosclerotic plaque IMPRESSION: 1. No acute displaced fracture or dislocation. 2.  Aortic Atherosclerosis (ICD10-I70.0). Electronically Signed   By: Tish Frederickson M.D.   On: 09/05/2022 21:49   DG Pelvis Portable  Result Date:  09/05/2022 CLINICAL DATA:  Trauma EXAM: PORTABLE PELVIS 1-2 VIEWS COMPARISON:  X-ray pelvis 11/15/2020 FINDINGS:  Surgical hardware of the right sacroiliac joint. Surgical hardware visualized lower lumbar spine. There is no evidence of pelvic fracture or diastasis. No acute displaced fracture or dislocation of either hips. No pelvic bone lesions are seen. IMPRESSION: Negative for acute traumatic injury. Electronically Signed   By: Tish Frederickson M.D.   On: 09/05/2022 21:48   DG Knee Complete 4 Views Right  Result Date: 09/05/2022 CLINICAL DATA:  Pain.  Trauma. EXAM: RIGHT KNEE - COMPLETE 4+ VIEW COMPARISON:  None Available. FINDINGS: No evidence of fracture or dislocation. Small joint effusion. Old healed proximal tibial shaft fracture. Mild tricompartmental degenerative changes spine. No evidence of severe arthropathy or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

## 2022-09-05 NOTE — ED Notes (Signed)
Aspen c-collar placed on pt per Dr. Lockie Mola.

## 2022-09-05 NOTE — H&P (Signed)
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 arteries. Skull: No acute fracture or focal lesion. Other: Left scalp large hematoma formation measuring up to 2.1 cm. CT MAXILLOFACIAL FINDINGS Osseous: No fracture or mandibular dislocation. No destructive process. Sinuses/Orbits: Paranasal sinuses and mastoid air cells are clear. Bilateral lens replacement. The orbits are unremarkable. Soft tissues: Negative. CT CERVICAL SPINE FINDINGS Alignment: Normal. Skull base and vertebrae: Multilevel moderate severe degenerative changes spine with associated moderate severe osseous neural foraminal stenosis at the right C3-C4, C4-C5 levels. Acute displaced and comminuted C6 spinous process fracture. No aggressive appearing focal osseous lesion or focal pathologic process. Soft tissues and spinal canal: No prevertebral fluid or swelling. No visible canal hematoma. Upper chest: Unremarkable. Other: Likely posterior nuchal hematoma. IMPRESSION: 1. No acute intracranial abnormality. 2. No acute displaced facial fracture. 3. Acute displaced and comminuted C6 spinous process fracture. Associated likely nuchal  hematoma. Recommend MRI for further evaluation of the ligaments. 4. Large left scalp hematoma measuring up to 2.1 cm. These images were dictated when made available. Electronically Signed: By: Tish Frederickson M.D. On: 09/05/2022 22:26   CT L-SPINE NO CHARGE  Result Date: 09/05/2022 CLINICAL DATA:  [id number] Pain [id number] EXAM: CT THORACIC AND LUMBAR SPINE WITHOUT CONTRAST TECHNIQUE: Multidetector CT imaging of the thoracic and lumbar spine was performed without contrast. Multiplanar CT image reconstructions were also generated. RADIATION DOSE REDUCTION: This exam was performed according to the departmental dose-optimization program which includes automated exposure control, adjustment of the mA and/or kV according to patient size and/or use of iterative reconstruction technique. COMPARISON:  Pet[facility] 07/11/2021, CT lumbar spine 09/27/2018 FINDINGS: CT THORACIC SPINE FINDINGS Alignment: Normal. Vertebrae: Multilevel moderate degenerative changes spine most prominent at the T7-T8 level. No acute fracture or focal pathologic process. Paraspinal and other soft tissues: Negative. Disc levels: Maintained. CT LUMBAR SPINE FINDINGS  Segmentation: 5 lumbar type vertebrae. Alignment: Straightening of normal lumbar lordosis likely due to surgical hardware and degenerative changes. Vertebrae: Multilevel severe degenerative changes of the spine. Status post laminectomy at the L3 through L5 levels with associated chronic similar-appearing cortical erosion and destruction. Interbody surgical hardware fusion at the L2-L3, L3-L4, L4-L5 levels. Bilateral posterior L2 surgical hardware. Posterior L3 surgical hardware removed. Posterior L4 surgical hardware partially removed with retained partial screws. Posterior left S1 screws removed. Posterior right S1 surgical hardware partially removed with partial retained screw. Right sacroiliac joint surgical hardware. No acute fracture or focal pathologic process. Paraspinal and other soft  tissues: Negative. Disc levels: Non-surgerized levels demonstrate at least moderate intervertebral disc space narrowing. IMPRESSION: CT THORACIC SPINE IMPRESSION 1. No acute displaced fracture or traumatic listhesis of the thoracic spine. CT LUMBAR SPINE IMPRESSION 1. No acute displaced fracture or traumatic listhesis of the lumbar spine. 2. Lumbosacral surgical hardware with partial removal of the surgical hardware. 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 Electronically Signed   By: Tish Frederickson M.D.   On: 09/05/2022 22:33   CT T-SPINE NO CHARGE  Result Date: 09/05/2022 CLINICAL DATA:  [id number] Pain [id number] EXAM: CT THORACIC AND LUMBAR SPINE WITHOUT CONTRAST TECHNIQUE: Multidetector CT imaging of the thoracic and lumbar spine was performed without contrast. Multiplanar CT image reconstructions were also generated. RADIATION DOSE REDUCTION: This exam was performed according to the departmental dose-optimization program which includes automated exposure control, adjustment of the mA and/or kV according to patient size and/or use of iterative reconstruction technique. COMPARISON:  PetCT 07/11/2021, CT lumbar spine 09/27/2018 FINDINGS: CT THORACIC SPINE FINDINGS Alignment: Normal. Vertebrae: Multilevel moderate degenerative changes spine most prominent at the T7-T8 level. No acute fracture or focal pathologic process. Paraspinal and other soft tissues: Negative. Disc levels: Maintained. CT LUMBAR SPINE FINDINGS Segmentation: 5 lumbar type vertebrae. Alignment: Straightening of normal lumbar lordosis likely due to surgical hardware and degenerative changes. Vertebrae: Multilevel severe degenerative changes of the spine. Status post laminectomy at the L3 through L5 levels with associated chronic similar-appearing cortical erosion and destruction. Interbody surgical hardware fusion at the L2-L3, L3-L4, L4-L5 levels.  Bilateral posterior L2 surgical hardware. Posterior L3 surgical hardware removed. Posterior L4 surgical hardware partially removed with retained partial screws. Posterior left S1 screws removed. Posterior right S1 surgical hardware partially removed with partial retained screw. Right sacroiliac joint surgical hardware. No acute fracture or focal pathologic process. Paraspinal and other soft tissues: Negative. Disc levels: Non-surgerized levels demonstrate at least moderate intervertebral disc space narrowing. IMPRESSION: CT THORACIC SPINE IMPRESSION 1. No acute displaced fracture or traumatic listhesis of the thoracic spine. CT LUMBAR SPINE IMPRESSION 1. No acute displaced fracture or traumatic listhesis of the lumbar spine. 2. Lumbosacral surgical hardware with partial removal of the surgical hardware. 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 Electronically Signed   By: Tish Frederickson M.D.   On: 09/05/2022 22:33   DG Shoulder Left  Result Date: 09/05/2022 CLINICAL DATA:  pain EXAM: LEFT SHOULDER - 2+ VIEW COMPARISON:  CT chest 09/05/2022, PET CT 07/11/2021 FINDINGS: There is no evidence of fracture or dislocation. Mild to moderate degenerative changes of the glenohumeral joint. Mild degenerative changes acromioclavicular joint. Soft tissues are unremarkable. Atherosclerotic plaque IMPRESSION: 1. No acute displaced fracture or dislocation. 2.  Aortic Atherosclerosis (ICD10-I70.0). Electronically Signed   By: Tish Frederickson M.D.   On: 09/05/2022 21:49   DG  Pelvis Portable  Result Date: 09/05/2022 CLINICAL DATA:  Trauma EXAM: PORTABLE PELVIS 1-2 VIEWS COMPARISON:  X-ray pelvis 11/15/2020 FINDINGS: Surgical hardware of the right sacroiliac joint. Surgical hardware visualized lower lumbar spine. There is no evidence of pelvic fracture or diastasis. No acute displaced fracture or dislocation of either hips. No  pelvic bone lesions are seen. IMPRESSION: Negative for acute traumatic injury. Electronically Signed   By: Tish Frederickson M.D.   On: 09/05/2022 21:48   DG Knee Complete 4 Views Right  Result Date: 09/05/2022 CLINICAL DATA:  Pain.  Trauma. EXAM: RIGHT KNEE - COMPLETE 4+ VIEW COMPARISON:  None Available. FINDINGS: No evidence of fracture or dislocation. Small joint effusion. Old healed proximal tibial shaft fracture. Mild tricompartmental degenerative changes spine. No evidence of severe arthropathy or other focal bone abnormality. Soft tissues are unremarkable. Vascular calcification. IMPRESSION: 1. No acute displaced fracture or dislocation. 2. Small joint effusion. Electronically Signed   By: Tish Frederickson M.D.   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 Cardiomegaly. Otherwise the heart and mediastinal contours are within normal limits. No focal consolidation. No pulmonary edema. No pleural effusion. No pneumothorax. Acute displaced right posterior sixth rib fracture. IMPRESSION: Acute displaced right posterior sixth rib fracture. Electronically Signed   By: Tish Frederickson M.D.   On: 09/05/2022 21:45     Assessment/Plan   1. C6 spinous process fracture with hematoma  - No acute neuro deficits   - Continue cervical collar, hold Plavix, check MRI, follow-up on neurosurgery recommendations   2. Rib fracture  - Pain-control, incentive spirometry    3. PAF  - Not anticoagulated d/t GIB  - Continue amiodarone    4. Chronic HFpEF  - Appears compensated  - Hold torsemide while NPO, monitor volume status   5. CAD  - No angina - Hold Plavix, continue Coreg   6. Type II DM  - A1c was 6.4% in July 2022  - Check CBGs and continue low-intensity SSI   7. OSA  - CPAP at bedtime    8. Gynecomastia  - Right > Lt gynecomastia  noted incidentally on CT in ED  - Outpatient follow-up for mammography recommended   DVT prophylaxis: SCDs  Code Status: Full  Level of Care: Level of care: Telemetry Medical Family Communication: Daughter at bedside   Disposition Plan:  Patient is from: Home  Anticipated d/c is to: TBD Anticipated d/c date is: Possibly as early as 7/31 or 09/07/22  Patient currently: Pending MRI c-spine, neurosurgery consultation, pain-control  Consults called: Neurosurgery  Admission status: Observation     Briscoe Deutscher, MD [facility]  09/05/2022, 11:15 PM
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
## 2022-09-06 DIAGNOSIS — S12500A Unspecified displaced fracture of sixth cervical vertebra, initial encounter for closed fracture: Secondary | ICD-10-CM | POA: Diagnosis not present

## 2022-09-06 LAB — GLUCOSE, CAPILLARY
Glucose-Capillary: 119 mg/dL — ABNORMAL HIGH (ref 70–99)
Glucose-Capillary: 138 mg/dL — ABNORMAL HIGH (ref 70–99)
Glucose-Capillary: 114 mg/dL — ABNORMAL HIGH (ref 70–99)
Glucose-Capillary: 100 mg/dL — ABNORMAL HIGH (ref 70–99)

## 2022-09-06 LAB — CBG MONITORING, ED
Glucose-Capillary: 142 mg/dL — ABNORMAL HIGH (ref 70–99)
Glucose-Capillary: 142 mg/dL — ABNORMAL HIGH (ref 70–99)
Glucose-Capillary: 151 mg/dL — ABNORMAL HIGH (ref 70–99)

## 2022-09-06 LAB — CK: Total CK: 668 U/L — ABNORMAL HIGH (ref 49–397)

## 2022-09-06 MED ORDER — SODIUM CHLORIDE 0.9 % IV SOLN: INTRAVENOUS | Status: AC

## 2022-09-06 NOTE — Progress Notes (Addendum)
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 acromioclavicular joint. Soft tissues are unremarkable. Atherosclerotic plaque IMPRESSION: 1. No acute displaced fracture or dislocation. 2.  Aortic Atherosclerosis (ICD10-I70.0). Electronically Signed   By: Tish Frederickson M.D.   On: 09/05/2022 21:49   DG Pelvis Portable  Result Date: 09/05/2022 CLINICAL DATA:  Trauma EXAM: PORTABLE PELVIS 1-2 VIEWS COMPARISON:  X-ray pelvis 11/15/2020 FINDINGS: Surgical hardware of the right sacroiliac joint. Surgical hardware visualized lower lumbar spine. There is no evidence of pelvic fracture or diastasis. No acute displaced fracture or dislocation of either hips. No pelvic bone lesions are seen. IMPRESSION: Negative for acute traumatic injury. Electronically Signed   By: Tish Frederickson M.D.   On: 09/05/2022 21:48   DG Knee Complete 4 Views Right  Result Date: 09/05/2022 CLINICAL DATA:  Pain.  Trauma. EXAM: RIGHT KNEE - COMPLETE 4+ VIEW COMPARISON:  None Available. FINDINGS: No evidence of fracture or dislocation. Small joint effusion. Old healed proximal tibial shaft fracture. Mild tricompartmental degenerative changes spine. No evidence of severe arthropathy or other focal bone abnormality. Soft  tissues are unremarkable. Vascular calcification. IMPRESSION: 1. No acute displaced fracture or dislocation. 2. Small joint effusion. Electronically Signed   By: Tish Frederickson M.D.   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 Cardiomegaly. Otherwise the heart and mediastinal contours are within normal limits. No focal consolidation. No pulmonary edema. No pleural effusion. No pneumothorax. Acute displaced right posterior sixth rib fracture. IMPRESSION: Acute displaced right posterior sixth rib fracture. Electronically Signed   By: Tish Frederickson M.D.   On: 09/05/2022 21:45     LOS: 0 days   Lanae Boast, MD [facility]  09/06/2022, 11:47 AM

## 2022-09-06 NOTE — Progress Notes (Signed)
Pacemaker is not MRI safe.

## 2022-09-06 NOTE — ED Notes (Signed)
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 lesion. No extra-axial collection. No mass effect or midline shift. No hydrocephalus. Basilar cisterns are patent. Vascular: No hyperdense vessel. Atherosclerotic calcifications are present within the cavernous internal carotid and vertebral arteries. Skull: No acute fracture or focal lesion. Other: Left scalp large  hematoma formation measuring up to 2.1 cm. CT MAXILLOFACIAL FINDINGS Osseous: No fracture or mandibular dislocation. No destructive process. Sinuses/Orbits: Paranasal sinuses and mastoid air cells are clear. Bilateral lens replacement. The orbits are unremarkable. Soft tissues: Negative. CT CERVICAL SPINE FINDINGS Alignment: Normal. Skull base and vertebrae: Multilevel moderate severe degenerative changes spine with associated moderate severe osseous neural foraminal stenosis at the right C3-C4, C4-C5 levels. Acute displaced and comminuted C6 spinous process fracture. No aggressive appearing focal osseous lesion or focal pathologic process. Soft tissues and spinal  canal: No prevertebral fluid or swelling. No visible canal hematoma. Upper chest: Unremarkable. Other: Likely posterior nuchal hematoma. IMPRESSION: 1. No acute intracranial abnormality. 2. No acute displaced facial fracture. 3. Acute displaced and comminuted C6 spinous process fracture. Associated likely nuchal hematoma. Recommend MRI for further evaluation of the ligaments. 4. Large left scalp hematoma measuring up to 2.1 cm. These images were dictated when made available. Electronically Signed: By: Tish Frederickson M.D. On: 09/05/2022 22:26   CT L-SPINE NO CHARGE  Result Date: 09/05/2022 CLINICAL DATA:  [id number] Pain [id number] EXAM: CT THORACIC AND LUMBAR SPINE WITHOUT CONTRAST TECHNIQUE: Multidetector CT imaging of the thoracic and lumbar spine was performed without contrast. Multiplanar CT image reconstructions were also generated. RADIATION DOSE REDUCTION: This exam was performed according to the departmental dose-optimization program which includes automated exposure control, adjustment of the mA and/or kV according to patient size and/or use of iterative reconstruction technique. COMPARISON:  PetCT 07/11/2021, CT lumbar spine 09/27/2018 FINDINGS: CT THORACIC SPINE FINDINGS Alignment: Normal. Vertebrae: Multilevel moderate degenerative changes spine most prominent at the T7-T8 level. No acute fracture or focal pathologic process. Paraspinal and other soft tissues: Negative. Disc levels: Maintained. CT LUMBAR SPINE FINDINGS Segmentation: 5 lumbar type vertebrae. Alignment: Straightening of normal lumbar lordosis likely due to surgical hardware and degenerative changes. Vertebrae: Multilevel severe degenerative changes of the spine. Status post laminectomy at the L3 through L5 levels with associated chronic similar-appearing cortical erosion and destruction. Interbody surgical hardware fusion at the L2-L3, L3-L4, L4-L5 levels. Bilateral posterior L2 surgical hardware. Posterior L3 surgical hardware removed.  Posterior L4 surgical hardware partially removed with retained partial screws. Posterior left S1 screws removed. Posterior right S1 surgical hardware partially removed with partial retained screw. Right sacroiliac joint surgical hardware. No acute fracture or focal pathologic process. Paraspinal and other soft tissues: Negative. Disc levels: Non-surgerized levels demonstrate at least moderate intervertebral disc space narrowing. IMPRESSION: CT THORACIC SPINE IMPRESSION 1. No acute displaced fracture or traumatic listhesis of the thoracic spine. CT LUMBAR SPINE IMPRESSION 1. No acute displaced fracture or traumatic listhesis of the lumbar spine. 2. Lumbosacral surgical hardware with partial removal of the surgical hardware. 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 Electronically Signed   By: Tish Frederickson M.D.   On: 09/05/2022 22:33   CT T-SPINE NO CHARGE  Result Date: 09/05/2022 CLINICAL DATA:  [id number] Pain [id number] EXAM: CT THORACIC AND LUMBAR SPINE WITHOUT CONTRAST TECHNIQUE: Multidetector CT imaging of the thoracic and lumbar spine was performed without contrast. Multiplanar CT image reconstructions were also generated. RADIATION DOSE REDUCTION: This exam was performed according to the departmental dose-optimization program which includes automated exposure control, adjustment of the mA and/or kV according to patient size and/or use of iterative reconstruction technique. COMPARISON:  PetCT 07/11/2021, CT lumbar spine 09/27/2018 FINDINGS: CT THORACIC SPINE FINDINGS Alignment: Normal. Vertebrae: Multilevel moderate degenerative changes spine most prominent at the T7-T8 level. No acute fracture or focal  pathologic process. Paraspinal and other soft tissues: Negative. Disc levels: Maintained. CT LUMBAR SPINE FINDINGS Segmentation: 5 lumbar type vertebrae. Alignment: Straightening of normal lumbar lordosis likely due  to surgical hardware and degenerative changes. Vertebrae: Multilevel severe degenerative changes of the spine. Status post laminectomy at the L3 through L5 levels with associated chronic similar-appearing cortical erosion and destruction. Interbody surgical hardware fusion at the L2-L3, L3-L4, L4-L5 levels. Bilateral posterior L2 surgical hardware. Posterior L3 surgical hardware removed. Posterior L4 surgical hardware partially removed with retained partial screws. Posterior left S1 screws removed. Posterior right S1 surgical hardware partially removed with partial retained screw. Right sacroiliac joint surgical hardware. No acute fracture or focal pathologic process. Paraspinal and other soft tissues: Negative. Disc levels: Non-surgerized levels demonstrate at least moderate intervertebral disc space narrowing. IMPRESSION: CT THORACIC SPINE IMPRESSION 1. No acute displaced fracture or traumatic listhesis of the thoracic spine. CT LUMBAR SPINE IMPRESSION 1. No acute displaced fracture or traumatic listhesis of the lumbar spine. 2. Lumbosacral surgical hardware with partial removal of the surgical hardware. 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 Electronically Signed   By: Tish Frederickson M.D.   On: 09/05/2022 22:33   DG Shoulder Left  Result Date: 09/05/2022 CLINICAL DATA:  pain EXAM: LEFT SHOULDER - 2+ VIEW COMPARISON:  CT chest 09/05/2022, PET CT 07/11/2021 FINDINGS: There is no evidence of fracture or dislocation. Mild to moderate degenerative changes of the glenohumeral joint. Mild degenerative changes acromioclavicular joint. Soft tissues are unremarkable. Atherosclerotic plaque IMPRESSION: 1. No acute displaced fracture or dislocation. 2.  Aortic Atherosclerosis (ICD10-I70.0). Electronically Signed   By: Tish Frederickson M.D.   On: 09/05/2022 21:49   DG Pelvis Portable  Result Date: 09/05/2022 CLINICAL  DATA:  Trauma EXAM: PORTABLE PELVIS 1-2 VIEWS COMPARISON:  X-ray pelvis 11/15/2020 FINDINGS: Surgical hardware of the right sacroiliac joint. Surgical hardware visualized lower lumbar spine. There is no evidence of pelvic fracture or diastasis. No acute displaced fracture or dislocation of either hips. No pelvic bone lesions are seen. IMPRESSION: Negative for acute traumatic injury. Electronically Signed   By: Tish Frederickson M.D.   On: 09/05/2022 21:48   DG Knee Complete 4 Views Right  Result Date: 09/05/2022 CLINICAL DATA:  Pain.  Trauma. EXAM: RIGHT KNEE - COMPLETE 4+ VIEW COMPARISON:  None Available. FINDINGS: No evidence of fracture or dislocation. Small joint effusion. Old healed proximal tibial shaft fracture. Mild tricompartmental degenerative changes spine. No evidence of severe arthropathy or other focal bone abnormality. Soft tissues are unremarkable. Vascular calcification. IMPRESSION: 1. No acute displaced fracture or dislocation. 2. Small joint effusion. Electronically Signed   By: Tish Frederickson M.D.   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 Cardiomegaly. Otherwise the heart and mediastinal contours are within normal limits. No focal consolidation. No pulmonary edema. No pleural effusion. No pneumothorax. Acute displaced right posterior sixth rib fracture. IMPRESSION: Acute displaced right posterior sixth rib fracture. Electronically Signed   By: Tish Frederickson M.D.   On: 09/05/2022 21:45    Pending Labs Unresulted Labs (From admission, onward)     Start     Ordered   09/06/22 0500  Basic metabolic panel  Daily,   R      09/05/22 2314   09/06/22 0500  CBC  Daily,  R      09/05/22 2314            Vitals/Pain Today's Vitals   09/06/22 0530 09/06/22 0600 09/06/22 0630 09/06/22 0700  BP: (!) 150/64 (!) 153/66 (!)  149/62 (!) 153/64  Pulse: (!) 59 61 (!) 59 61  Resp: 17 16 (!) 21 18  Temp:   98 F (36.7 C)   TempSrc:   Oral   SpO2: 98% 99% 98% 100%  PainSc:        Isolation Precautions No active isolations  Medications Medications  amiodarone (PACERONE) tablet 200 mg (has no administration in time range)  carvedilol (COREG) tablet 25 mg (25 mg Oral Given 09/06/22 0042)  insulin aspart (novoLOG) injection 0-6 Units (1 Units Subcutaneous Given 09/06/22 0439)  sodium chloride flush (NS) 0.9 % injection 3 mL (3 mLs Intravenous Given 09/06/22 0044)  acetaminophen (TYLENOL) tablet 650 mg (has no administration in time range)    Or  acetaminophen (TYLENOL) suppository 650 mg (has no administration in time range)  oxyCODONE (Oxy IR/ROXICODONE) immediate release tablet 5 mg (has no administration in time range)  fentaNYL (SUBLIMAZE) injection 12.5-50 mcg (50 mcg Intravenous Given 09/06/22 0446)  polyethylene glycol (MIRALAX / GLYCOLAX) packet 17 g (has no administration in time range)  bisacodyl (DULCOLAX) EC tablet 5 mg (has no administration in time range)  Tdap (BOOSTRIX) injection 0.5 mL (0.5 mLs Intramuscular Given 09/05/22 2111)  fentaNYL (SUBLIMAZE) injection 50 mcg (50 mcg Intravenous Given 09/05/22 2111)  sodium chloride 0.9 % bolus 1,000 mL (0 mLs Intravenous Stopped 09/05/22 2318)  iohexol (OMNIPAQUE) 350 MG/ML injection 75 mL (75 mLs Intravenous Contrast Given 09/05/22 2210)    Mobility walks     Focused Assessments Neuro Assessment Handoff  Swallow screen pass?    Cardiac Rhythm: Normal sinus rhythm       Neuro Assessment: Within Defined Limits Neuro Checks:      If patient is a Neuro Trauma and patient is going to OR before floor call report to 4N Charge nurse: [phone] or [phone]   R Recommendations: See Admitting Provider Note  Report given to:   Additional Notes: Neuro check q4hrs next is due at 0930.  Pt possibly going to MRI.  Pt has a pacemaker.
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
## 2022-09-06 NOTE — TOC CAGE-AID Note (Signed)
Transition of Care [facility]) - CAGE-AID Screening   Patient Details  Name: Alan [name] MRN: [id number] Date of Birth: [birth year]  [facility]) CM/SW Contact:    Leota Sauers, RN Phone Number: 09/06/2022, 6:32 AM   Clinical Narrative:  Patient denies use of alcohol and illicit drugs. Education not offered at this time.  CAGE-AID Screening:    Have You Ever Felt You Ought to Cut Down on Your Drinking or Drug Use?: No Have People Annoyed You By Critizing Your Drinking Or Drug Use?: No Have You Felt Bad Or Guilty About Your Drinking Or Drug Use?: No Have You Ever Had a Drink or Used Drugs First Thing In The Morning to Steady Your Nerves or to Get Rid of a Hangover?: No CAGE-AID Score: 0  Substance Abuse Education Offered: No
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
## 2022-09-06 NOTE — Progress Notes (Signed)
   Dr. Jonathon Bellows reached out in secure chat from nurse where neurosurgery was inquiring about PPM interrogation. Patient was reporting little electric shock type sensation over PPM site. I spoke with Verlon Au, [employer] representative. She is in [place] but will come interrogate device. Will likely be another hour or so. Per secure chat recommendations, she will call the floor nurse at the number requested to provide update of findings. I also gave her our on call fellow number in case cardiology needs to be involved. Please reach out if we can be of further assistance.

## 2022-09-07 DIAGNOSIS — Z23 Encounter for immunization: Secondary | ICD-10-CM | POA: Diagnosis present

## 2022-09-07 DIAGNOSIS — T796XXA Traumatic ischemia of muscle, initial encounter: Secondary | ICD-10-CM | POA: Diagnosis present

## 2022-09-07 DIAGNOSIS — I130 Hypertensive heart and chronic kidney disease with heart failure and stage 1 through stage 4 chronic kidney disease, or unspecified chronic kidney disease: Secondary | ICD-10-CM | POA: Diagnosis present

## 2022-09-07 DIAGNOSIS — Z96652 Presence of left artificial knee joint: Secondary | ICD-10-CM | POA: Diagnosis present

## 2022-09-07 DIAGNOSIS — N62 Hypertrophy of breast: Secondary | ICD-10-CM | POA: Diagnosis present

## 2022-09-07 DIAGNOSIS — N1832 Chronic kidney disease, stage 3b: Secondary | ICD-10-CM

## 2022-09-07 DIAGNOSIS — Z961 Presence of intraocular lens: Secondary | ICD-10-CM | POA: Diagnosis present

## 2022-09-07 DIAGNOSIS — M549 Dorsalgia, unspecified: Secondary | ICD-10-CM | POA: Diagnosis present

## 2022-09-07 DIAGNOSIS — E782 Mixed hyperlipidemia: Secondary | ICD-10-CM | POA: Diagnosis present

## 2022-09-07 DIAGNOSIS — Z794 Long term (current) use of insulin: Secondary | ICD-10-CM | POA: Diagnosis not present

## 2022-09-07 DIAGNOSIS — D696 Thrombocytopenia, unspecified: Secondary | ICD-10-CM | POA: Diagnosis present

## 2022-09-07 DIAGNOSIS — I25708 Atherosclerosis of coronary artery bypass graft(s), unspecified, with other forms of angina pectoris: Secondary | ICD-10-CM | POA: Diagnosis present

## 2022-09-07 DIAGNOSIS — S2231XA Fracture of one rib, right side, initial encounter for closed fracture: Secondary | ICD-10-CM | POA: Diagnosis present

## 2022-09-07 DIAGNOSIS — S0003XA Contusion of scalp, initial encounter: Secondary | ICD-10-CM | POA: Diagnosis present

## 2022-09-07 DIAGNOSIS — E1122 Type 2 diabetes mellitus with diabetic chronic kidney disease: Secondary | ICD-10-CM | POA: Diagnosis present

## 2022-09-07 DIAGNOSIS — I2510 Atherosclerotic heart disease of native coronary artery without angina pectoris: Secondary | ICD-10-CM

## 2022-09-07 DIAGNOSIS — I480 Paroxysmal atrial fibrillation: Secondary | ICD-10-CM | POA: Diagnosis present

## 2022-09-07 DIAGNOSIS — G14 Postpolio syndrome: Secondary | ICD-10-CM | POA: Diagnosis present

## 2022-09-07 DIAGNOSIS — G8929 Other chronic pain: Secondary | ICD-10-CM | POA: Diagnosis present

## 2022-09-07 DIAGNOSIS — I255 Ischemic cardiomyopathy: Secondary | ICD-10-CM | POA: Diagnosis present

## 2022-09-07 DIAGNOSIS — I5032 Chronic diastolic (congestive) heart failure: Secondary | ICD-10-CM | POA: Diagnosis present

## 2022-09-07 DIAGNOSIS — D472 Monoclonal gammopathy: Secondary | ICD-10-CM | POA: Diagnosis present

## 2022-09-07 DIAGNOSIS — S12500A Unspecified displaced fracture of sixth cervical vertebra, initial encounter for closed fracture: Secondary | ICD-10-CM | POA: Diagnosis present

## 2022-09-07 DIAGNOSIS — G4733 Obstructive sleep apnea (adult) (pediatric): Secondary | ICD-10-CM | POA: Diagnosis present

## 2022-09-07 LAB — GLUCOSE, CAPILLARY
Glucose-Capillary: 111 mg/dL — ABNORMAL HIGH (ref 70–99)
Glucose-Capillary: 143 mg/dL — ABNORMAL HIGH (ref 70–99)
Glucose-Capillary: 156 mg/dL — ABNORMAL HIGH (ref 70–99)
Glucose-Capillary: 170 mg/dL — ABNORMAL HIGH (ref 70–99)
Glucose-Capillary: 171 mg/dL — ABNORMAL HIGH (ref 70–99)
Glucose-Capillary: 196 mg/dL — ABNORMAL HIGH (ref 70–99)

## 2022-09-07 NOTE — Plan of Care (Signed)

## 2022-09-07 NOTE — Evaluation (Signed)
Occupational Therapy Evaluation Patient Details Name: Alan [name] MRN: [id number] DOB: 1940-12-23 Today's Date: 09/07/2022   History of Present Illness 82 y.o. male presents after mowing his lawn yesterday when machine rolled over on top of him. He was pinned under machine for about an hour. Currently w/ some modeate neck pain w/o radiation to BUEs. He was found to have a closed C6 fracture. Past medical history significant for hypertension, CKD 3B, CAD status post CABG and redo, chronic HFpEF, CHB with pacemaker, type 2 diabetes mellitus, smoldering myeloma, and atrial fibrillation   Clinical Impression   Pt admitted for above, reports being ind PTA but he reports an extensive falls history (he notes 20). Educated pt on cervical precautions, handout provided, he does well with maintaining his precautions during functional activities. He needs min guard for mobility and to complete bADLs at sink. His LUE and LLE is weaker than his R side, LUE is significantly weaker and is not his baseline per his report. Recommended pt place [facility] over toilet to provide push off assist. Pt would benefit from continued acute skilled OT services to address deficits and help transition to next level of care. No follow-up OT needed at this time, may need some PT to progress with balance and gait.       Recommendations for follow up therapy are one component of a multi-disciplinary discharge planning process, led by the attending physician.  Recommendations may be updated based on patient status, additional functional criteria and insurance authorization.   Assistance Recommended at Discharge Intermittent Supervision/Assistance  Patient can return home with the following Assistance with cooking/housework;Assist for transportation    Functional Status Assessment  Patient has had a recent decline in their functional status and demonstrates the ability to make significant improvements in function in a reasonable and  predictable amount of time.  Equipment Recommendations  None recommended by OT (pt has rec DME)    Recommendations for Other Services       Precautions / Restrictions Precautions Precautions: Fall Precaution Comments: Neuro DC'd C collar, but pt has it on Restrictions Weight Bearing Restrictions: No      Mobility Bed Mobility               General bed mobility comments: Pt rec'd and left sitting in recliner    Transfers Overall transfer level: Needs assistance Equipment used: Rolling walker (2 wheels) Transfers: Sit to/from Stand Sit to Stand: Min guard           General transfer comment: STS x2      Balance Overall balance assessment: Mild deficits observed, not formally tested                                         ADL either performed or assessed with clinical judgement   ADL Overall ADL's : Needs assistance/impaired Eating/Feeding: Independent;Sitting   Grooming: Standing;Oral care;Min guard Grooming Details (indicate cue type and reason): educated pt on compensatory strategy, bringing things to mouth to avoid having head in dependent position Upper Body Bathing: Independent;Sitting   Lower Body Bathing: Sitting/lateral leans;Supervison/ safety Lower Body Bathing Details (indicate cue type and reason): figure four technique Upper Body Dressing : Independent;Sitting   Lower Body Dressing: Supervision/safety;Sitting/lateral leans Lower Body Dressing Details (indicate cue type and reason): using figure four, wears slip ons at baseline Toilet Transfer: Ambulation;Min guard   Toileting- Clothing Manipulation and Hygiene:  Min guard;Sit to/from stand       Functional mobility during ADLs: Min guard General ADL Comments: in room no AD, balance unsteady, would benefit from RW     Vision         Perception     Praxis      Pertinent Vitals/Pain Pain Assessment Pain Assessment: 0-10 Pain Score: 7  Pain Location: on the L side  in his ribs, Pain Descriptors / Indicators: Aching, Burning Pain Intervention(s): Limited activity within patient's tolerance, Monitored during session, Repositioned     Hand Dominance Right   Extremity/Trunk Assessment Upper Extremity Assessment Upper Extremity Assessment: LUE deficits/detail RUE Deficits / Details: WFL LUE Deficits / Details: generally weak, 3/5 overall   Lower Extremity Assessment Lower Extremity Assessment: Generalized weakness   Cervical / Trunk Assessment Cervical / Trunk Assessment: Normal (C6 spinous process fracture)   Communication Communication Communication: HOH   Cognition Arousal/Alertness: Awake/alert Behavior During Therapy: WFL for tasks assessed/performed Overall Cognitive Status: Within Functional Limits for tasks assessed                                       General Comments  VSS    Exercises     Shoulder Instructions      Home Living Family/patient expects to be discharged to:: Private residence Living Arrangements: Alone Available Help at Discharge: Available PRN/intermittently;Friend(s) (daughter and long time girlfriend) Type of Home: House Home Access: Stairs to enter Secretary/administrator of Steps: 3 Entrance Stairs-Rails: Left Home Layout: One level     Bathroom Shower/Tub: Producer, television/film/video: Standard Bathroom Accessibility: Yes   Home Equipment: Shower seat - built in;Shower seat;Rollator (4 wheels);Cane - single point;BSC/3in1;Grab bars - tub/shower;Grab bars - toilet   Additional Comments: Pt has 2 cats      Prior Functioning/Environment Prior Level of Function : Independent/Modified Independent             Mobility Comments: Pt states that for the past 6 weeks he has had HHPT due to multiple falls with a recent hip x-ray. Pt states that he has been using a rollator at home per his HHPT ADLs Comments: Modified independent        OT Problem List: Decreased  strength;Impaired balance (sitting and/or standing)      OT Treatment/Interventions: Self-care/ADL training;Therapeutic activities;Therapeutic exercise;Patient/family education;Balance training    OT Goals(Current goals can be found in the care plan section) Acute Rehab OT Goals Patient Stated Goal: To go home OT Goal Formulation: With patient Time For Goal Achievement: 09/21/22 Potential to Achieve Goals: Good ADL Goals Pt Will Perform Grooming: with supervision;standing Pt Will Perform Lower Body Bathing: sitting/lateral leans;with supervision (while maintaining cervical precautions) Pt Will Transfer to Toilet: with supervision;ambulating Additional ADL Goal #1: Pt will verblize understanding of 3/3 c spine precautions  OT Frequency: Min 1X/week    Co-evaluation              AM-PAC OT "6 Clicks" Daily Activity     Outcome Measure Help from another person eating meals?: None Help from another person taking care of personal grooming?: A Little Help from another person toileting, which includes using toliet, bedpan, or urinal?: A Little Help from another person bathing (including washing, rinsing, drying)?: A Little Help from another person to put on and taking off regular upper body clothing?: A Little Help from another person to put on and  taking off regular lower body clothing?: A Little 6 Click Score: 19   End of Session Equipment Utilized During Treatment: Gait belt Nurse Communication: Mobility status  Activity Tolerance: Patient tolerated treatment well Patient left: in chair;with call bell/phone within reach;with chair alarm set;with family/visitor present  OT Visit Diagnosis: Unsteadiness on feet (R26.81);Other abnormalities of gait and mobility (R26.89);Muscle weakness (generalized) (M62.81)                Time: 1224-1300 OT Time Calculation (min): 36 min Charges:  OT General Charges $OT Visit: 1 Visit OT Evaluation $OT Eval Moderate Complexity: 1 Mod OT  Treatments $Self Care/Home Management : 8-22 mins  09/07/2022  AB, OTR/L  Acute Rehabilitation Services  Office: [phone]   Tristan Schroeder 09/07/2022, 2:38 PM
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
## 2022-09-08 DIAGNOSIS — I5032 Chronic diastolic (congestive) heart failure: Secondary | ICD-10-CM | POA: Diagnosis not present

## 2022-09-08 DIAGNOSIS — S2231XA Fracture of one rib, right side, initial encounter for closed fracture: Secondary | ICD-10-CM | POA: Diagnosis not present

## 2022-09-08 DIAGNOSIS — I25708 Atherosclerosis of coronary artery bypass graft(s), unspecified, with other forms of angina pectoris: Secondary | ICD-10-CM | POA: Diagnosis not present

## 2022-09-08 DIAGNOSIS — S12500A Unspecified displaced fracture of sixth cervical vertebra, initial encounter for closed fracture: Secondary | ICD-10-CM | POA: Diagnosis not present

## 2022-09-08 LAB — GLUCOSE, CAPILLARY
Glucose-Capillary: 125 mg/dL — ABNORMAL HIGH (ref 70–99)
Glucose-Capillary: 143 mg/dL — ABNORMAL HIGH (ref 70–99)
Glucose-Capillary: 146 mg/dL — ABNORMAL HIGH (ref 70–99)
Glucose-Capillary: 190 mg/dL — ABNORMAL HIGH (ref 70–99)

## 2022-09-08 MED ORDER — OXYCODONE HCL 5 MG PO TABS: 5.0000 mg | ORAL_TABLET | Freq: Four times a day (QID) | ORAL | 0 refills | Status: DC | PRN

## 2022-09-08 NOTE — Discharge Instructions (Signed)
Outpatient mammography recommended for gynecomastia

## 2022-09-08 NOTE — TOC Transition Note (Signed)
Transition of Care [facility]) - CM/SW Discharge Note   Patient Details  Name: Alan [name] MRN: [id number] Date of Birth: [birth year]  [facility] CM/SW Contact:  Epifanio Lesches, RN Phone Number: 09/08/2022, 10:13 AM   Clinical Narrative:    Patient will DC to: home Anticipated DC date: 09/08/2022 Family notified: yes  Transport by: car          - Closed C6 fracture  Per MD patient ready for DC today. RN,  and patient, notified of DC. Pt agreeable to home health services. Pt states active with [facility] and would like to continue using their services. Referral made with Amy/ [facility] and accepted. Pt without DME needs. States has RW @ home. States girlfriend and daughter will assist with care once d/c. Family to provide transportation to home. Pt without RX med concerns. Post hospital f/u noted on AVS. RNCM will sign off for now as intervention is no longer needed. Please consult [place] again if new needs arise.    Final next level of care: Home w Home Health Services     Patient Goals and CMS Choice   Choice offered to / list presented to : Patient  Discharge Placement                         Discharge Plan and Services Additional resources added to the After Visit Summary for                            [facility] Arranged: PT [facility] Agency: [facility] Date [facility] Agency Contacted: 09/08/22 Time HH Agency Contacted: 1012 Representative spoke with at [facility] Agency: Amy  Social Determinants of Health (SDOH) Interventions SDOH Screenings   Food Insecurity: No Food Insecurity (09/06/2022)  Housing: Low Risk  (09/06/2022)  Transportation Needs: No Transportation Needs (09/06/2022)  Utilities: Not At Risk (09/06/2022)  Alcohol Screen: Low Risk  (02/04/2021)  Financial Resource Strain: Low Risk  (03/22/2022)   Received from [facility], [facility]  Physical Activity: Insufficiently Active (03/22/2022)   Received from [facility], [facility]  Social  Connections: Socially Integrated (03/22/2022)   Received from [facility], [facility]  Stress: No Stress Concern Present (03/22/2022)   Received from [facility], [facility]  Tobacco Use: Low Risk  (09/05/2022)     Readmission Risk Interventions     No data to display

## 2022-09-08 NOTE — Progress Notes (Signed)
Patient discharged, discharge instructions given and explained. Patient has no additional questions and or concerns at this time. Patient IV removed per protocol. Patient skin tear wounds cleaned and redressed. Patient will be discharged home with girlfriend via wheelchair for safety.

## 2022-09-08 NOTE — Progress Notes (Signed)
Pt placed self on; mask adjusted   09/08/22 0025  BiPAP/CPAP/SIPAP  BiPAP/CPAP/SIPAP Pt Type Adult (Home unit)  BiPAP/CPAP/SIPAP  (Home unit)  Mask Type Nasal mask  EPAP 12 cmH2O  Patient Home Equipment Yes

## 2022-09-08 NOTE — Discharge Summary (Signed)
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  or dislocation. Mild to moderate degenerative changes of the glenohumeral joint. Mild degenerative changes acromioclavicular joint. Soft tissues are unremarkable. Atherosclerotic plaque IMPRESSION: 1. No acute displaced fracture or dislocation. 2.  Aortic Atherosclerosis (ICD10-I70.0). Electronically Signed   By: Tish Frederickson M.D.   On: 09/05/2022 21:49   DG Pelvis Portable  Result Date: 09/05/2022 CLINICAL DATA:  Trauma EXAM: PORTABLE PELVIS 1-2 VIEWS COMPARISON:  X-ray pelvis 11/15/2020 FINDINGS: Surgical hardware of the right sacroiliac joint. Surgical hardware visualized lower lumbar spine. There is no evidence of pelvic fracture or diastasis. No acute displaced fracture or dislocation of either hips. No pelvic bone lesions are seen. IMPRESSION: Negative for acute traumatic injury. Electronically Signed   By: Tish Frederickson M.D.   On: 09/05/2022 21:48   DG Knee Complete 4 Views Right  Result Date: 09/05/2022 CLINICAL DATA:  Pain.  Trauma. EXAM: RIGHT KNEE - COMPLETE 4+ VIEW COMPARISON:  None Available. FINDINGS: No evidence of fracture or dislocation. Small joint effusion. Old healed proximal tibial shaft fracture. Mild  tricompartmental degenerative changes spine. No evidence of severe arthropathy or other focal bone abnormality. Soft tissues are unremarkable. Vascular calcification. IMPRESSION: 1. No acute displaced fracture or dislocation. 2. Small joint effusion. Electronically Signed   By: Tish Frederickson M.D.   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 Cardiomegaly. Otherwise the heart and mediastinal contours are within normal limits. No focal consolidation. No pulmonary edema. No pleural effusion. No pneumothorax. Acute displaced right posterior sixth rib fracture. IMPRESSION: Acute displaced right posterior sixth rib fracture. Electronically Signed   By: Tish Frederickson M.D.   On: 09/05/2022 21:45    Microbiology: Results for orders placed or performed during the hospital encounter of 02/02/21  Resp Panel by RT-PCR (Flu A&B, Covid) Nasopharyngeal Swab     Status: None   Collection Time: 02/02/21  3:55 PM   Specimen: Nasopharyngeal Swab; Nasopharyngeal(NP) swabs in vial transport medium  Result Value Ref Range Status   SARS Coronavirus 2 by RT PCR NEGATIVE NEGATIVE Final    Comment: (NOTE) SARS-CoV-2 target nucleic acids are NOT DETECTED.  The SARS-CoV-2 RNA is generally detectable in upper respiratory specimens during the acute phase of infection. The lowest concentration of SARS-CoV-2 viral copies this assay can detect is 138 copies/mL. A negative result does not preclude SARS-Cov-2 infection and should not be used as the sole basis for treatment or other patient management decisions. A negative result may occur with  improper specimen collection/handling, submission of specimen other than nasopharyngeal swab, presence of viral mutation(s) within the areas targeted by this assay, and inadequate number of viral copies(<138 copies/mL). A  negative result must be combined with clinical observations, patient history, and epidemiological information. The expected result is Negative.  Fact Sheet for Patients:  BloggerCourse.com  Fact Sheet for Healthcare Providers:  SeriousBroker.it  This test is no t yet approved or cleared by the [place] FDA and  has been authorized for detection and/or diagnosis of SARS-CoV-2 by FDA under an Emergency Use Authorization (EUA). This EUA will remain  in effect (meaning this test can be used) for the duration of the COVID-19 declaration under Section 564(b)(1) of the Act, 21 U.S.C.section 360bbb-3(b)(1), unless the authorization is terminated  or revoked sooner.       Influenza A by PCR NEGATIVE NEGATIVE Final   Influenza  B by PCR NEGATIVE NEGATIVE Final    Comment: (NOTE) The Xpert Xpress SARS-CoV-2/FLU/RSV plus assay is intended as an aid in the diagnosis of influenza from Nasopharyngeal swab specimens and should not be used as a sole basis for treatment. Nasal washings and aspirates are unacceptable for Xpert Xpress SARS-CoV-2/FLU/RSV testing.  Fact Sheet for Patients: BloggerCourse.com  Fact Sheet for Healthcare Providers: SeriousBroker.it  This test is not yet approved or cleared by the [place] FDA and has been authorized for detection and/or diagnosis of SARS-CoV-2 by FDA under an Emergency Use Authorization (EUA). This EUA will remain in effect (meaning this test can be used) for the duration of the COVID-19 declaration under Section 564(b)(1) of the Act, 21 U.S.C. section 360bbb-3(b)(1), unless the authorization is terminated or revoked.  Performed at [facility], [address] [place], [place] [address]     Labs: CBC: Recent Labs  Lab 09/05/22 2040 09/06/22 0052 09/07/22 0400 09/08/22 0801  WBC 10.9* 10.1 7.4 6.8  NEUTROABS  9.2*  --   --   --   HGB 12.7* 10.8* 10.1* 9.6*  HCT 39.3 33.8* 31.3* 30.5*  MCV 99.7 96.8 99.4 97.1  PLT 112* 103* 97* 101*   Basic Metabolic Panel: Recent Labs  Lab 09/05/22 2040 09/06/22 0052 09/07/22 0400 09/08/22 0801  NA 134* 136 133* 133*  K 4.3 3.9 3.7 4.2  CL 100 104 101 103  CO2 22 21* 21* 22  GLUCOSE 177* 160* 132* 145*  BUN 28* 27* 27* 29*  CREATININE 1.76* 1.59* 1.84* 2.07*  CALCIUM 9.2 8.7* 8.4* 8.5*   Liver Function Tests: Recent Labs  Lab 09/05/22 2040 09/06/22 0052  AST 42* 39  ALT 28 26  ALKPHOS 72 60  BILITOT 0.7 1.0  PROT 8.4* 7.0  ALBUMIN 3.6 3.1*   CBG: Recent Labs  Lab 09/07/22 1724 09/07/22 2035 09/08/22 0000 09/08/22 0413 09/08/22 0725  GLUCAP 171* 156* 143* 146* 125*    Discharge time spent: greater than 30 minutes.  Signed: Meredeth Ide, MD [facility] 09/08/2022

## 2022-09-08 NOTE — Care Management Important Message (Signed)
Important Message  Patient Details  Name: Alan [name] MRN: [id number] Date of Birth: Jun 18, 1940   Medicare Important Message Given:  Yes     Sherilyn Banker 09/08/2022, 2:09 PM

## 2022-09-08 NOTE — Plan of Care (Signed)

## 2022-09-14 ENCOUNTER — Other Ambulatory Visit: Payer: Self-pay

## 2022-09-14 DIAGNOSIS — D472 Monoclonal gammopathy: Secondary | ICD-10-CM

## 2022-09-15 ENCOUNTER — Inpatient Hospital Stay: Payer: Medicare HMO | Attending: Hematology

## 2022-09-15 ENCOUNTER — Other Ambulatory Visit: Payer: Self-pay

## 2022-09-15 DIAGNOSIS — I255 Ischemic cardiomyopathy: Secondary | ICD-10-CM | POA: Diagnosis not present

## 2022-09-15 DIAGNOSIS — I2510 Atherosclerotic heart disease of native coronary artery without angina pectoris: Secondary | ICD-10-CM | POA: Diagnosis not present

## 2022-09-15 DIAGNOSIS — D472 Monoclonal gammopathy: Secondary | ICD-10-CM | POA: Insufficient documentation

## 2022-09-15 DIAGNOSIS — E1122 Type 2 diabetes mellitus with diabetic chronic kidney disease: Secondary | ICD-10-CM | POA: Diagnosis not present

## 2022-09-15 DIAGNOSIS — N183 Chronic kidney disease, stage 3 unspecified: Secondary | ICD-10-CM | POA: Diagnosis not present

## 2022-09-15 DIAGNOSIS — D649 Anemia, unspecified: Secondary | ICD-10-CM | POA: Diagnosis not present

## 2022-09-15 DIAGNOSIS — I130 Hypertensive heart and chronic kidney disease with heart failure and stage 1 through stage 4 chronic kidney disease, or unspecified chronic kidney disease: Secondary | ICD-10-CM | POA: Diagnosis not present

## 2022-09-15 LAB — CBC WITH DIFFERENTIAL (CANCER CENTER ONLY)
Abs Immature Granulocytes: 0.02 10*3/uL (ref 0.00–0.07)
Basophils Absolute: 0 10*3/uL (ref 0.0–0.1)
Basophils Relative: 1 %
Eosinophils Absolute: 0.1 10*3/uL (ref 0.0–0.5)
Eosinophils Relative: 3 %
HCT: 29.9 % — ABNORMAL LOW (ref 39.0–52.0)
Hemoglobin: 9.7 g/dL — ABNORMAL LOW (ref 13.0–17.0)
Immature Granulocytes: 0 %
Lymphocytes Relative: 15 %
Lymphs Abs: 0.7 10*3/uL (ref 0.7–4.0)
MCH: 31.6 pg (ref 26.0–34.0)
MCHC: 32.4 g/dL (ref 30.0–36.0)
MCV: 97.4 fL (ref 80.0–100.0)
Monocytes Absolute: 0.4 10*3/uL (ref 0.1–1.0)
Monocytes Relative: 9 %
Neutro Abs: 3.3 10*3/uL (ref 1.7–7.7)
Neutrophils Relative %: 72 %
Platelet Count: 163 10*3/uL (ref 150–400)
RBC: 3.07 MIL/uL — ABNORMAL LOW (ref 4.22–5.81)
RDW: 13.5 % (ref 11.5–15.5)
WBC Count: 4.5 10*3/uL (ref 4.0–10.5)
nRBC: 0 % (ref 0.0–0.2)

## 2022-09-15 LAB — CMP (CANCER CENTER ONLY)
ALT: 19 U/L (ref 0–44)
AST: 19 U/L (ref 15–41)
Albumin: 3.4 g/dL — ABNORMAL LOW (ref 3.5–5.0)
Alkaline Phosphatase: 75 U/L (ref 38–126)
Anion gap: 6 (ref 5–15)
BUN: 31 mg/dL — ABNORMAL HIGH (ref 8–23)
CO2: 26 mmol/L (ref 22–32)
Calcium: 8.7 mg/dL — ABNORMAL LOW (ref 8.9–10.3)
Chloride: 105 mmol/L (ref 98–111)
Creatinine: 1.86 mg/dL — ABNORMAL HIGH (ref 0.61–1.24)
GFR, Estimated: 36 mL/min — ABNORMAL LOW (ref >60)
Glucose, Bld: 245 mg/dL — ABNORMAL HIGH (ref 70–99)
Potassium: 4.3 mmol/L (ref 3.5–5.1)
Sodium: 137 mmol/L (ref 135–145)
Total Bilirubin: 0.4 mg/dL (ref 0.3–1.2)
Total Protein: 7.4 g/dL (ref 6.5–8.1)

## 2022-09-15 LAB — LACTATE DEHYDROGENASE: LDH: 162 U/L (ref 98–192)

## 2022-09-15 LAB — SEDIMENTATION RATE: Sed Rate: 46 mm/hr — ABNORMAL HIGH (ref 0–16)
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
## 2022-09-24 NOTE — Progress Notes (Unsigned)
Cardiology Office Note:  .   Date:  09/25/2022  ID:  Alan [name], DOB Jun 28, 1940, MRN [id number] PCP: Eartha Inch, MD  [facility] Providers Cardiologist:  Thurmon Fair, MD    History of Present Illness: .   Alan [name] is a 82 y.o. male with past medical history of complete heart block s/p pacemaker placement in 2008, CAD s/p CABG x2 (LIMA to LAD, SVG to PDA) in 1990 followed by multiple PCIs to RCA from 1990 to 2004 followed by redo CABGx2 (SVG-RCA-PDA) in 2004, paroxysmal atrial fibrillation not on anticoagulation do to hx of GI bleeding s/p MAZE procedure at time of CABG in 2004, chronic diastolic HF, CKD stage IV, HTN, HLD, type 2 DM, smoldering myeloma ad post polio syndrome. He is followed by Dr. Angelena Sole and Dr. Shirlee Latch, he presents today for hospital follow up.   Cardiac catheterization in 2020 showed significant three-vessel native CAD with patent grafts of the LIMA to LAD/diagonal and SVG to right PDA.  It also showed significant diffuse disease in the distal Lcx (AV groove) supplying a small area, it was recommended to contiue with medical therapy.  Echocardiogram in 01/2021 during admission for CHF showed LVEF of 55 to 60% with normal wall motion and mild LVH, normal RV size and function and mild to moderate TR.  He underwent workup for possible cardiac amyloidosis with a PYP scan in 02/2021 which was not suggestive of TTR.  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  Biopsy stain negative for amyloidosis.  He is unable to undergo cardiac MRI as his pacemaker is not compatible.  From 09/05/2022 to 09/08/2022 he was admitted following accident in which his riding lawnmower rolled over resulting in his being lodged under it.  He was stuck under his mower for about 90 minutes.  His workup showed  an acute displaced and comminuted C6 spinous process fracture with likely nuchal hematoma as well as an acute displaced posterior 6th rib fracture on the right side without pneumothorax.  Neurosurgery was consulted but did not recommend surgical invention. Cardiology was consulted given a slight shocking feeling per patient at his pacemaker site, pacemaker was interrogated and had normal function.  Today he presents for follow up. He reports he is doing well, still noting some pain with certain movements, continues wearing neck collar. He notes some chest soreness with movement, denies feeling as though it is cardiac in nature. Denies shortness of breath, lower extremity edema or palpitations.   LKG:MWNUU he denies chest pain, shortness of breath, lower extremity edema, fatigue, palpitations, melena, hematuria, hemoptysis, diaphoresis, weakness, presyncope, syncope, orthopnea, and PND.   Studies Reviewed: .       Cardiac Studies & Procedures   CARDIAC CATHETERIZATION  CARDIAC CATHETERIZATION 12/18/2018  Narrative  Prox LAD lesion is 100% stenosed.  2nd Mrg lesion is 40% stenosed.  Dist Cx lesion is 99% stenosed.  The left ventricular systolic function is normal.  LV end diastolic pressure is mildly elevated.  The left ventricular ejection fraction is 50-55% by visual estimate.  Prox RCA lesion is 100% stenosed.  Mid RCA to Dist RCA lesion is 70% stenosed.  SVG.  The graft exhibits no disease.  LIMA and is normal in caliber.  The graft exhibits no disease.  1.  Significant underlying three-vessel coronary artery disease  with patent grafts including LIMA to LAD/diagonal and SVG to right PDA.  There is significant diffuse disease in the distal left circumflex (AV groove) supplying a small area. 2.  Normal LV systolic function with mildly elevated left ventricular end-diastolic pressure at 16 mmHg.  Recommendations: I recommend continuing medical therapy.  The AV groove left  circumflex is too small and diseased in a long segment.  It supplies a small area and I doubt that this is giving him much symptoms. The patient likely has diastolic dysfunction given elevated LVEDP.  Findings Coronary Findings Diagnostic  Dominance: Right  Left Main The vessel exhibits minimal luminal irregularities.  Left Anterior Descending Prox LAD lesion is 100% stenosed.  Left Circumflex Dist Cx lesion is 99% stenosed.  First Obtuse Marginal Branch There is mild disease in the vessel.  Second Obtuse Marginal Branch 2nd Mrg lesion is 40% stenosed.  Right Coronary Artery Prox RCA lesion is 100% stenosed. Mid RCA to Dist RCA lesion is 70% stenosed. The lesion was previously treated.  Saphenous Graft To RPDA SVG. The graft exhibits no disease.  Sequential LIMA LIMA Graft To 2nd Sept, Mid LAD LIMA and is normal in caliber. The graft exhibits no disease.  Y-graft Branch To 2nd Diag  Intervention  No interventions have been documented.     ECHOCARDIOGRAM  ECHOCARDIOGRAM COMPLETE 02/03/2021  Narrative ECHOCARDIOGRAM REPORT    Patient Name:   Alan [name] Date of Exam: 02/03/2021 Medical Rec #:  [id number]     Height:       70.0 in Accession #:    [id number]    Weight:       204.1 lb Date of Birth:  Jun 06, 1940    BSA:          2.105 m Patient Age:    80 years      BP:           139/69 mmHg Patient Gender: M             HR:           62 bpm. Exam Location:  Inpatient  Procedure: 2D Echo, 3D Echo, Cardiac Doppler and Color Doppler  Indications:    R06.02 SOB  History:        Patient has prior history of Echocardiogram examinations, most recent 08/24/2020. CHF and Cardiomyopathy, CAD, Prior CABG, Abnormal ECG and Pacemaker, Arrythmias:Atrial Fibrillation and Atrial Flutter, Signs/Symptoms:Shortness of Breath and Dyspnea; Risk Factors:Diabetes, Sleep Apnea, Hypertension and Dyslipidemia.  Sonographer:    Sheralyn Boatman RDCS Referring Phys: [id number] Cecille Po  [facility]   Sonographer Comments: Technically difficult study due to poor echo windows. Image acquisition challenging due to patient body habitus. IMPRESSIONS   1. Left ventricular ejection fraction, by estimation, is 55 to 60%. Left ventricular ejection fraction by 3D volume is 56 %. Left ventricular ejection fraction by 2D MOD biplane is 59.4 %. Left ventricular ejection fraction by PLAX is 60 %. The left ventricle has normal function. The left ventricle has no regional wall motion abnormalities. There is mild concentric left ventricular hypertrophy. Indeterminate diastolic filling due to E-A fusion. 2. Right ventricular systolic function is normal. The right ventricular size is normal. There is normal pulmonary artery systolic pressure. 3. Left atrial size was moderately dilated. 4. The mitral valve is normal in structure. Trivial mitral valve regurgitation. No evidence of mitral stenosis. 5. Tricuspid valve regurgitation is mild to moderate. 6. The aortic valve is tricuspid. Aortic valve regurgitation is not visualized. No aortic  stenosis is present. 7. The inferior vena cava is dilated in size with >50% respiratory variability, suggesting right atrial pressure of 8 mmHg.  FINDINGS Left Ventricle: Left ventricular ejection fraction, by estimation, is 55 to 60%. Left ventricular ejection fraction by PLAX is 60 %. Left ventricular ejection fraction by 2D MOD biplane is 59.4 %. Left ventricular ejection fraction by 3D volume is 56 %. The left ventricle has normal function. The left ventricle has no regional wall motion abnormalities. The left ventricular internal cavity size was normal in size. There is mild concentric left ventricular hypertrophy. Indeterminate diastolic filling due to E-A fusion. Indeterminate filling pressures.  Right Ventricle: The right ventricular size is normal. No increase in right ventricular wall thickness. Right ventricular systolic function is normal. There is normal  pulmonary artery systolic pressure. The tricuspid regurgitant velocity is 2.61 m/s, and with an assumed right atrial pressure of 8 mmHg, the estimated right ventricular systolic pressure is 35.2 mmHg.  Left Atrium: Left atrial size was moderately dilated.  Right Atrium: Right atrial size was normal in size.  Pericardium: There is no evidence of pericardial effusion.  Mitral Valve: The mitral valve is normal in structure. Mild mitral annular calcification. Trivial mitral valve regurgitation. No evidence of mitral valve stenosis.  Tricuspid Valve: The tricuspid valve is normal in structure. Tricuspid valve regurgitation is mild to moderate. No evidence of tricuspid stenosis.  Aortic Valve: The aortic valve is tricuspid. Aortic valve regurgitation is not visualized. No aortic stenosis is present.  Pulmonic Valve: The pulmonic valve was normal in structure. Pulmonic valve regurgitation is not visualized. No evidence of pulmonic stenosis.  Aorta: The aortic root is normal in size and structure.  Venous: The inferior vena cava is dilated in size with greater than 50% respiratory variability, suggesting right atrial pressure of 8 mmHg.  IAS/Shunts: No atrial level shunt detected by color flow Doppler.  Additional Comments: A device lead is visualized.   LEFT VENTRICLE PLAX 2D                        Biplane EF (MOD) LV EF:         Left            LV Biplane EF:   Left ventricular                      ventricular ejection                         ejection fraction by                      fraction by PLAX is 60                       2D MOD %.                               biplane is LVIDd:         5.60 cm                          59.4 %. LVIDs:         3.80 cm LV PW:         1.30 cm         Diastology LV IVS:  1.20 cm         LV e' medial:    6.53 cm/s LVOT diam:     2.30 cm         LV E/e' medial:  14.0 LV SV:         87              LV e' lateral:   13.10 cm/s LV SV Index:   41               LV E/e' lateral: 7.0 LVOT Area:     4.15 cm  3D Volume EF LV Volumes (MOD)               LV 3D EF:    Left LV vol d, MOD    89.0 ml                    ventricul A2C:                                        ar LV vol d, MOD    129.0 ml                   ejection A4C:                                        fraction LV vol s, MOD    37.5 ml                    by 3D A2C:                                        volume is LV vol s, MOD    52.5 ml                    56 %. A4C: LV SV MOD A2C:   51.5 ml LV SV MOD A4C:   129.0 ml      3D Volume EF: LV SV MOD BP:    64.4 ml       3D EF:        56 % LV EDV:       154 ml LV ESV:       67 ml LV SV:        87 ml  RIGHT VENTRICLE            IVC RV S prime:     6.65 cm/s  IVC diam: 2.40 cm TAPSE (M-mode): 1.3 cm  LEFT ATRIUM             Index        RIGHT ATRIUM           Index LA diam:        4.90 cm 2.33 cm/m   RA Area:     22.00 cm LA Vol (A2C):   61.1 ml 29.02 ml/m  RA Volume:   58.20 ml  27.64 ml/m LA Vol (A4C):   77.6 ml 36.86 ml/m LA Biplane Vol: 69.2 ml 32.87 ml/m AORTIC VALVE LVOT Vmax:   103.00 cm/s LVOT Vmean:  63.200 cm/s LVOT VTI:    0.209 m  AORTA Ao Root diam: 3.50 cm Ao Asc diam:  3.30 cm  MITRAL VALVE               TRICUSPID VALVE MV Area (PHT): 3.91 cm    TR Peak grad:   27.2 mmHg MV Decel Time: 194 msec    TR Vmax:        261.00 cm/s MV E velocity: 91.35 cm/s SHUNTS Systemic VTI:  0.21 m Systemic Diam: 2.30 cm  Chilton Si MD Electronically signed by Chilton Si MD Signature Date/Time: 02/03/2021/6:28:58 PM    Final   TEE  ECHO TEE 01/15/2019  Narrative TRANSESOPHOGEAL ECHO REPORT    Patient Name:   ROBERTANTHONY SULLENDER Date of Exam: 01/15/2019 Medical Rec #:  [id number]     Height:       69.5 in Accession #:    [id number]    Weight:       225.6 lb Date of Birth:  1940-07-10    BSA:          2.19 m Patient Age:    77 years      BP:           142/79 mmHg Patient Gender: M              HR:           62 bpm. Exam Location:  Outpatient   Procedure: Transesophageal Echo, Color Doppler and Cardiac Doppler  Indications:     atrial fibrillation  History:         Patient has prior history of Echocardiogram examinations, most recent 12/30/2018.  Sonographer:     Delcie Roch Referring Phys:  [id number] MIHAI [name] Diagnosing Phys: Thurmon Fair MD    PROCEDURE: The TEE was followed by Cardioversion. Patients was monitored while under deep sedation. The transesophogeal probe was passed through the esophogus of the patient. The patient developed no complications during the procedure.  IMPRESSIONS   1. Left ventricular ejection fraction, by visual estimation, is 55 to 60%. The left ventricle has normal function. Normal left ventricular size. There is no left ventricular hypertrophy. 2. Abnormal septal motion consistent with RV pacemaker. 3. The left ventricle has no regional wall motion abnormalities. 4. Global right ventricle has normal systolic function.The right ventricular size is normal. No increase in right ventricular wall thickness. 5. Left atrial size was moderately dilated. 6. The left atrial appendage is surgically clipped, but there appears to be a permeable lobe, in the immediate vicinity of the left upper pulmonary vein. No thrombus is seen in the left atrium. 7. Right atrial size was mildly dilated. 8. The mitral valve is normal in structure. Mild mitral valve regurgitation. 9. The tricuspid valve is normal in structure. Tricuspid valve regurgitation is trivial. 10. The aortic valve is normal in structure. Aortic valve regurgitation is not visualized. 11. The pulmonic valve was normal in structure. Pulmonic valve regurgitation is not visualized. 12. A pacer wire is visualized. 13. Pacemaker leads present in the right atrium. There is a tiny filamentous mobile mass (2x7 mm) on one of the leads, possibly a thrombus, chronicity unknown.  FINDINGS Left  Ventricle: Left ventricular ejection fraction, by visual estimation, is 55 to 60%. The left ventricle has normal function. The left ventricle has no regional wall motion abnormalities. No evidence of left ventricular regional wall motion abnormalities. There is no left ventricular hypertrophy. Normal left ventricular size. Abnormal (paradoxical) septal motion, consistent with RV pacemaker.  Right Ventricle: The right ventricular size  is normal. No increase in right ventricular wall thickness. Global RV systolic function is has normal systolic function.  Left Atrium: Left atrial size was moderately dilated. The left atrial appendage is surgically clipped, but there appears to be a permeable lobe, in the immediate vicinity of the left upper pulmonary vein. No thrombus is seen in the left atrium.  Right Atrium: Right atrial size was mildly dilated Pacemaker leads present in the right atrium. There is a tiny filamentous mobile mass (2x7 mm) on one of the leads, possibly a thrombus, chronicity unknown.  Pericardium: There is no evidence of pericardial effusion.  Mitral Valve: The mitral valve is normal in structure. Mild mitral valve regurgitation, with centrally-directed jet.  Tricuspid Valve: The tricuspid valve is normal in structure. Tricuspid valve regurgitation is trivial.  Aortic Valve: The aortic valve is normal in structure. Aortic valve regurgitation is not visualized.  Pulmonic Valve: The pulmonic valve was normal in structure. Pulmonic valve regurgitation is not visualized.  Aorta: The aortic root, ascending aorta, aortic arch and descending aorta are all structurally normal, with no evidence of dilitation or obstruction.  Shunts: No atrial level shunt detected by color flow Doppler.  Additional Comments: A pacer wire is visualized.  Thurmon Fair MD Electronically signed by Thurmon Fair MD Signature Date/Time: 01/15/2019/3:56:48 PM    Final            Risk  Assessment/Calculations:    CHA2DS2-VASc Score = 6   This indicates a 9.7% annual risk of stroke. The patient's score is based upon: CHF History: 1 HTN History: 1 Diabetes History: 1 Stroke History: 0 Vascular Disease History: 1 Age Score: 2 Gender Score: 0            Physical Exam:   VS:  BP 116/60   Pulse 72   Ht 5\' 10"  (1.778 m)   Wt 203 lb (92.1 kg)   SpO2 98%   BMI 29.13 kg/m    Wt Readings from Last 3 Encounters:  09/25/22 203 lb (92.1 kg)  05/30/22 208 lb 12.8 oz (94.7 kg)  04/11/22 212 lb 11.2 oz (96.5 kg)    GEN: Well nourished, well developed in no acute distress NECK: No JVD; No carotid bruits CARDIAC: RRR, no murmurs, rubs, gallops RESPIRATORY:  Clear to auscultation without rales, wheezing or rhonchi  ABDOMEN: Soft, non-tender, non-distended EXTREMITIES:  No edema; No deformity   ASSESSMENT AND PLAN: .   CAD: S/p CABGx2 in 1900 followed by multiple PCIs then redo CABG in 2004. LHC in 2020 showed significant three vessel native CAD with patent grafts to the LIMA to LAD/Diag and SVG to right PDA. There was significant diffuse disease in the distal LXC (AV groove) supplying a small area. Continuation of medical therapy was recommended. Stable with no anginal symptoms. No indication for ischemic evaluation.  Continue plavix monotherapy. Intolerant to statins and Zetia, will refer to lipid clinic for possible PCSK9i. Continue Coreg, Plavix, Jardiance and torsemide.   Chronic HFpEF: Last echocardiogram in 01/2021 showed LVEF of 55 to 60% with normal wall motion and mild LVH, normal RV size and function and mild to moderate TR.  UIP scan in 02/2021 which was not suggestive of TTR.  Her myeloma panel and urine immunofixation both showed monoclonal gammopathy.  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  Biopsy stain negative for amyloidosis.  He is  unable to  undergo cardiac MRI as pacemaker is not compatible. Today he appears euvolemic and well compensated on exam today. He is also followed by Dr. Shirlee Latch who recommended repeat echo in 3 months at last visit in 12/2021.  Will order repeat echo and have him follow-up with Dr. Shirlee Latch. Continue Torsemide 5mg  daily, Coreg 25mg  twice daily, Jardiance 10mg  daily.   Parosyxmal atrial fibrillation: Maintaining sinus rhythm on exam.  Continue Coreg 25 mg twice daily, continue amiodarone 200 mg daily.  CHA2DS2-VASc Score = 6 Therefore, the patient's annual risk of stroke is 9.7 %. He is not on anticoagulation due to history of GI bleeds, he is also not candidate for watchman given partial closure of the appendage by surgical clip during CABG in 2004. For amiodarone monitoring he had eye doctor visit 2 months ago, per patient report, TSH 0.614, AST 19 and ALT 19 on 08/23/22. Continue amiodarone and Coreg.   Complete Heart block s/p PPM: S/p [employer] PPM placement in 2008. Followed dy Dr. Royann Shivers.  HTN: Blood pressure well controlled today at 116/60. Continue Coreg as above.  HLD: Last lipid profile on 01/12/22 indicated Cholesterol 159, Triglycerides 101, HDL 46 and LDL 94. Intolerant to statin and Zetia. He is interested in starting PCSK9i, will refer to pharm D lipid clinic.   Type 2 diabetes mellitus: Hemoglobin A1C 6.6 on 07/05/22. On Jardiance, Glipizide, and Insulin. Managed and monitored by PCP.   CKD stage IIIb/IV: Baseline creatinine 1.8 to 2.3. His most recent creatinine was 1.86 on 09/15/2022. Followed by Dr. Malen Gauze.   Sternal pain: Notes sensation of discomfort/aggravating sensation when bending forward at lower sternum. Reviewed Chest xray and chest CT from admission, no evidence of acute sternal injury on scans. Area tender to palpation, no sternal instability noted. Suspect related to recent trauma. Recommended continued monitoring and following up with PCP if ongoing.        Dispo: Follow up  with Dr. Shirlee Latch following echocardiogram. Follow up with Dr. Royann Shivers in 6 months.   Signed, Rip Harbour, NP

## 2022-09-25 ENCOUNTER — Encounter: Payer: Self-pay | Admitting: Student

## 2022-09-25 ENCOUNTER — Ambulatory Visit: Payer: Medicare HMO | Attending: Student | Admitting: Cardiology

## 2022-09-25 VITALS — BP 116/60 | HR 72 | Ht 70.0 in | Wt 203.0 lb

## 2022-09-25 DIAGNOSIS — N184 Chronic kidney disease, stage 4 (severe): Secondary | ICD-10-CM

## 2022-09-25 DIAGNOSIS — I10 Essential (primary) hypertension: Secondary | ICD-10-CM

## 2022-09-25 DIAGNOSIS — I5032 Chronic diastolic (congestive) heart failure: Secondary | ICD-10-CM

## 2022-09-25 DIAGNOSIS — I442 Atrioventricular block, complete: Secondary | ICD-10-CM

## 2022-09-25 DIAGNOSIS — E7800 Pure hypercholesterolemia, unspecified: Secondary | ICD-10-CM

## 2022-09-25 DIAGNOSIS — I25708 Atherosclerosis of coronary artery bypass graft(s), unspecified, with other forms of angina pectoris: Secondary | ICD-10-CM

## 2022-09-25 DIAGNOSIS — I480 Paroxysmal atrial fibrillation: Secondary | ICD-10-CM

## 2022-09-25 DIAGNOSIS — Z950 Presence of cardiac pacemaker: Secondary | ICD-10-CM | POA: Diagnosis not present

## 2022-09-25 DIAGNOSIS — Z789 Other specified health status: Secondary | ICD-10-CM

## 2022-09-25 NOTE — Patient Instructions (Addendum)
Medication Instructions:  The current medical regimen is effective;  continue present plan and medications as directed. Please refer to the Current Medication list given to you today.  *If you need a refill on your cardiac medications before your next appointment, please call your pharmacy*  Lab Work: NONE If you have labs (blood work) drawn today and your tests are completely normal, you will receive your results only by:  MyChart Message (if you have MyChart) OR  A paper copy in the mail If you have any lab test that is abnormal or we need to change your treatment, we will call you to review the results.  Testing/Procedures: Your physician has requested that you have an echocardiogram. Echocardiography is a painless test that uses sound waves to create images of your heart. It provides your doctor with information about the size and shape of your heart and how well your heart's chambers and valves are working. This procedure takes approximately one hour. There are no restrictions for this procedure. Please do NOT wear cologne, perfume, aftershave, or lotions (deodorant is allowed). Please arrive 15 minutes prior to your appointment time.   Other Instructions REFERRAL TO PHARMD-LIPIDS  Follow-Up: At [facility] you and your health needs are our priority.  As part of our continuing mission to provide you with exceptional heart care, we have created designated Provider Care Teams.  These Care Teams include your primary Cardiologist (physician) and Advanced Practice Providers (APPs -  Physician Assistants and Nurse Practitioners) who all work together to provide you with the care you need, when you need it.  Your next appointment:   6 month(s)  Provider:   Thurmon Fair, MD  AND SCHEDULE FOLLOW UP WITH DR [facility]

## 2022-09-29 ENCOUNTER — Inpatient Hospital Stay: Payer: Medicare HMO | Admitting: Hematology

## 2022-09-29 VITALS — BP 151/74 | HR 86 | Temp 98.0°F | Resp 17 | Ht 70.0 in | Wt 204.0 lb

## 2022-09-29 DIAGNOSIS — D472 Monoclonal gammopathy: Secondary | ICD-10-CM

## 2022-09-29 NOTE — Progress Notes (Signed)
HEMATOLOGY/ONCOLOGY CLINIC NOTE  Date of Service: 09/29/2022  Patient Care Team: Eartha Inch, MD as PCP - General (Family Medicine) [name], Rachelle Hora, MD as PCP - Cardiology (Cardiology) [name], Rachelle Hora, MD as Consulting Physician (Cardiology) Janalyn Harder, MD (Inactive) as Consulting Physician (Dermatology) Glyn Ade, PA-C as Physician Assistant (Dermatology)  CHIEF COMPLAINTS/PURPOSE OF CONSULTATION:  Follow-up for evaluation and management of plasma cell dyscrasia and monoclonal paraproteinemia  HISTORY OF PRESENTING ILLNESS:  See previous note for details of initial presentation.  INTERVAL HISTORY: Alan [name] is a 82 y.o. 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   Patient was last seen by me on 04/11/2022 and complained of balance issues causing falls, mild occasional back pain, and chest tenderness in right pectoral area.  He presented to the ED on 09/05/2022 due to a lawnmower accident during which he rolled down a 12-foot embankment and became stuck in a creek for an extended time due to being lodged under his mower. He complained of pain at the base of his neck and posterior head. Patient also reported chronic bilateral upper extremity weakness and right leg weakness. Chest x-ray showed acute displaced right posterior sixth rib fracture. Further imaging showed acute displaced and comminuted C6 spinous process fracture and associated likely nuchal hematoma and large left scalp hematoma measuring up to 2.1 cm. A cervical collar was placed.   Today, he reports that following his mower accident, he did not have any strength in his arms and could not move his neck. He reports that he did have some open wounds in his legs, arms, and chest following the accident as well as a scalp hematoma. Patient denies any other bleeding issues. He has worn a cervical collar for 4 weeks so far. His posterior neck pain continues to be bothersome at  this time. Patient reports that there are no plans for surgical intervention.   He has no chest pain at this time. Patient reports occasional left shoulder discomfort with difficulty lifting and lowering his left arm.  Patient reports fairly normal p.o. intake. His blood pressure in clinic today is 151/74. His DM has generally been well controlled. He currently takes 25 MG Carvedilol twice daily.   MEDICAL HISTORY:  Past Medical History:  Diagnosis Date   Anticoagulant long-term use    Arthritis    Arthropathy of left shoulder    Atypical mole    Biceps tendonitis, left    Bursitis of left shoulder    Cardiac pacemaker in situ    first insertion 2008;  genertor and new lead change 03-08-2012;   [employer]   CHB (complete heart block) (HCC)    s/p  PPM 2008   Chronic back pain    CKD (chronic kidney disease), stage III [facility])    Coronary artery disease cardiologist--- dr croitoru   hx  CABG x2  1990 and re-do 2004;  multiple PCI to RCA 1990 to 2004;    cardiac cath 2012  occluded LAD and RCA with patent grafts   Difficult intubation    " Dr. Royann Shivers said that it was difficulty to get the tube in."   History of coronary angioplasty    multiple PCI to RCA ,  1990 to 2004   History of GI bleed followed by GI-- Everette Rank PA @ [facility] [place]   01/ 2020  upper and lower GI bleed ;  s/p  EGD with cautery of jejunal bleed and blood transfusion's  History of kidney stones    Hx of echocardiogram 06/29/2008   EF 45-50%    Hypertension    Ischemic cardiomyopathy    previously reported , ef 40-45%;  2010--- ef 45-50%;   last echo 2017 55-60%   Mixed hyperlipidemia    Nocturia    OSA on CPAP    cpap, 12, last sleep study Jan2013, sees Dr. Fannie Knee   PAF (paroxysmal atrial fibrillation) (HCC)    long hx PAF--- followed by dr croitoru   Post-polio syndrome    dx polio age 65;   09-30-2018  per pt occasional gait issues and occasion fall   Presence of permanent  cardiac pacemaker    Pseudoarthrosis of lumbar spine    Rotator cuff tear, left    S/P CABG x 2    1990 x2  and 2004  x2   Sinus node dysfunction (HCC)    2015--- sinus node arrest  with intact AV conduction   Squamous cell carcinoma of skin 06/14/2021   Mid Parietal Scalp (in situ) (tx p bx)   Type 2 diabetes mellitus (HCC)    followed by pcp    SURGICAL HISTORY: Past Surgical History:  Procedure Laterality Date   APPENDECTOMY  child   APPLICATION OF INTRAOPERATIVE CT SCAN N/A 07/20/2020   Procedure: APPLICATION OF INTRAOPERATIVE CT SCAN;  Surgeon: [name], Alan Mulder, DO;  Location: [facility] OR;  Service: Neurosurgery;  Laterality: N/A;   BLEPHAROPLASTY Bilateral 12/2014   upper eyelid   CARDIAC PACEMAKER PLACEMENT  2008   CARDIOVERSION N/A 01/15/2019   Procedure: CARDIOVERSION;  Surgeon: Thurmon Fair, MD;  Location: [facility] ENDOSCOPY;  Service: Cardiovascular;  Laterality: N/A;   CARPAL TUNNEL RELEASE Bilateral 2015;  2016   CATARACT EXTRACTION W/ INTRAOCULAR LENS  IMPLANT, BILATERAL  2017   CORONARY ANGIOPLASTY  1990  to 2004   multiple to RCA   CORONARY ARTERY BYPASS GRAFT  1990    @ [place] (dr chitwood)   seq. LIMA to LAD and Diagonal   CORONARY ARTERY BYPASS GRAFT  2004     dr Janey Genta   SVG to  RCA and PDA;  ALSO MAZE  PROCEDURE   ESOPHAGOGASTRODUODENOSCOPY Left 03/18/2013   Procedure: ESOPHAGOGASTRODUODENOSCOPY (EGD);  Surgeon: Florencia Reasons, MD;  Location: [facility] ENDOSCOPY;  Service: Endoscopy;  Laterality: Left;   HARDWARE REMOVAL N/A 05/18/2016   Procedure: Removal of broken hardware Lumbar three-four;  Surgeon: Tia Alert, MD;  Location: [facility] OR;  Service: Neurosurgery;  Laterality: N/A;   HERNIA REPAIR     LEFT HEART CATH AND CORS/GRAFTS ANGIOGRAPHY N/A 12/18/2018   Procedure: LEFT HEART CATH AND CORS/GRAFTS ANGIOGRAPHY;  Surgeon: Iran Ouch, MD;  Location: [facility] INVASIVE CV LAB;  Service: Cardiovascular;  Laterality: N/A;   LEFT HEART CATHETERIZATION WITH  CORONARY/GRAFT ANGIOGRAM N/A 12/21/2010   Procedure: LEFT HEART CATHETERIZATION WITH Isabel Caprice;  Surgeon: Marykay Lex, MD;  Location: [facility] CATH LAB;  Service: Cardiovascular;  Laterality: N/A;   LEG SURGERY Right 2002  approx.   lengthened his leg   LUMBAR LAMINECTOMY/DECOMPRESSION MICRODISCECTOMY  01/10/2012   Procedure: LUMBAR LAMINECTOMY/DECOMPRESSION MICRODISCECTOMY 1 LEVEL;  Surgeon: Tia Alert, MD;  Location: [facility] NEURO ORS;  Service: Neurosurgery;  Laterality: Bilateral;  Lumbar three-four decompression, Posterior lateral fusion lumbar three-four, posterior spinus plate lumbar three-four   LUMBAR LAMINECTOMY/DECOMPRESSION MICRODISCECTOMY Left 05/18/2016   Procedure: Laminectomy and Foraminotomy - Lumbar two-lumbar three- Lumbar four-lumbar five- left with removal hardware lumbar three-four;  Surgeon: Tia Alert,  MD;  Location: [facility] OR;  Service: Neurosurgery;  Laterality: Left;   NASAL SEPTUM SURGERY  yrs ago   PACEMAKER REVISION N/A 03/07/2012   Procedure: PACEMAKER REVISION;  Surgeon: Thurmon Fair, MD;  Location: [facility] CATH LAB;  Service: Cardiovascular;  Laterality: N/A;   POSTERIOR LUMBAR FUSION  02-22-2017   dr Yetta Barre  @ [facility]   re-do laminectomy L2-3 and fusion   POSTERIOR LUMBAR FUSION  03-11-2018   dr Yetta Barre @ [facility]   laminectomy L4-5;  fixation L2-S1 and fusion L3-S1   SACROILIAC JOINT FUSION N/A 07/20/2020   Procedure: Sacroiliac Joint Fusion;  Surgeon: Bethann Goo, DO;  [facility]: [facility] OR;  Service: Neurosurgery;  Laterality: N/A;   SHOULDER ARTHROSCOPY Right after 2016, pt unsure year   SHOULDER ARTHROSCOPY WITH OPEN ROTATOR CUFF REPAIR Right 03/30/2014   Procedure: RIGHT SHOULDER ARTHROSCOPY  OPEN ROTATOR CUFF REPAIR;  Surgeon: Nestor Lewandowsky, MD;  Location: [facility] OR;  Service: Orthopedics;  Laterality: Right;   SHOULDER ARTHROSCOPY WITH SUBACROMIAL DECOMPRESSION, ROTATOR CUFF REPAIR AND BICEP TENDON REPAIR Left 10/01/2018   Procedure: LEFT SHOULDER ARTHROSCOPY, DISTAL  CLAVICLE EXCISION,SUBACROMIAL, DECOMPRESSION, RORATOR CUFF REPAIR, BICEPS TENODESIS;  Surgeon: Sheral Apley, MD;  Location: [facility] [place];  Service: Orthopedics;  Laterality: Left;   SHOULDER OPEN ROTATOR CUFF REPAIR Right 03/30/2014   Procedure: ROTATOR CUFF REPAIR SHOULDER OPEN;  Surgeon: Nestor Lewandowsky, MD;  Location: [facility] OR;  Service: Orthopedics;  Laterality: Right;   TEE WITHOUT CARDIOVERSION N/A 01/15/2019   Procedure: TRANSESOPHAGEAL ECHOCARDIOGRAM (TEE);  Surgeon: Thurmon Fair, MD;  Location: [facility] ENDOSCOPY;  Service: Cardiovascular;  Laterality: N/A;   TONSILLECTOMY  child   TOTAL KNEE ARTHROPLASTY Left 2006    SOCIAL HISTORY: Social History   Socioeconomic History   Marital status: Single    Spouse name: Not on file   Number of children: 1   Years of education: 16   Highest education level: Associate degree: academic program  Occupational History   Occupation: pilot-retired  Tobacco Use   Smoking status: Never   Smokeless tobacco: Never  Vaping Use   Vaping status: Never Used  Substance and Sexual Activity   Alcohol use: Not Currently   Drug use: Never   Sexual activity: Yes  Other Topics Concern   Not on file  Social History Narrative   [place], [place].    Right-handed.   1 cup caffeine per day.   Social Determinants of Health   Financial Resource Strain: Low Risk  (03/22/2022)   Received from [facility], [facility]   Overall Financial Resource Strain (CARDIA)    Difficulty of Paying Living Expenses: Not hard at all  Food Insecurity: No Food Insecurity (09/06/2022)   Hunger Vital Sign    Worried About Running Out of Food in the Last Year: Never true    Ran Out of Food in the Last Year: Never true  Transportation Needs: No Transportation Needs (09/06/2022)   PRAPARE - Administrator, Civil Service (Medical): No    Lack of Transportation (Non-Medical): No  Physical Activity: Insufficiently Active  (03/22/2022)   Received from [facility], [facility]   Exercise Vital Sign    Days of Exercise per Week: 2 days    Minutes of Exercise per Session: 20 min  Stress: No Stress Concern Present (03/22/2022)   Received from [facility], [facility] - Occupational Stress Questionnaire    Feeling  of Stress : Not at all  Social Connections: Socially Integrated (03/22/2022)   Received from [facility], [facility]   Social Network    How would you rate your social network (family, work, friends)?: Good participation with social networks  Intimate Partner Violence: Not At Risk (09/06/2022)   Humiliation, Afraid, Rape, and Kick questionnaire    Fear of Current or Ex-Partner: No    Emotionally Abused: No    Physically Abused: No    Sexually Abused: No  Retired Occupational hygienist.  Also worked as a Firefighter.  Lives in [place]. Non-smoker.  Rare alcohol use.   FAMILY HISTORY: Family History  Problem Relation Age of Onset   Other Father        MVA   Cancer Mother     ALLERGIES:  has No Known Allergies.  MEDICATIONS:  Current Outpatient Medications  Medication Sig Dispense Refill   amiodarone (PACERONE) 200 MG tablet Take 200 mg by mouth daily.     carvedilol (COREG) 25 MG tablet Take 25 mg by mouth 2 (two) times daily with a meal.     clopidogrel (PLAVIX) 75 MG tablet Take 1 tablet (75 mg total) by mouth daily. 90 tablet 3   Cyanocobalamin (VITAMIN B-12 PO) Take 1 tablet by mouth daily.     DULoxetine (CYMBALTA) 60 MG capsule Take 60 mg by mouth daily.     empagliflozin (JARDIANCE) 10 MG TABS tablet Take 1 tablet (10 mg total) by mouth daily. 90 tablet 3   glipiZIDE (GLUCOTROL) 10 MG tablet Take 10 mg by mouth 2 (two) times daily.     ibuprofen (ADVIL) 200 MG tablet Take 400 mg by mouth 2 (two) times daily as needed for headache or moderate pain.     insulin lispro (HUMALOG) 100 UNIT/ML injection Inject 0-10 Units into the skin 2 (two) times daily  as needed for high blood sugar. Per sliding scale.     Multiple Vitamin (MULTIVITAMIN WITH MINERALS) TABS tablet Take 1 tablet by mouth daily.      oxyCODONE (OXY IR/ROXICODONE) 5 MG immediate release tablet Take 1 tablet (5 mg total) by mouth every 6 (six) hours as needed for moderate pain. 20 tablet 0   torsemide (DEMADEX) 10 MG tablet Take 5 mg by mouth daily.     triamcinolone cream (KENALOG) 0.1 % Apply 1 Application topically 2 (two) times daily as needed (rough skin patches).     No current facility-administered medications for this visit.    REVIEW OF SYSTEMS:   10 Point review of Systems was done is negative except as noted above.  PHYSICAL EXAMINATION: ECOG PERFORMANCE STATUS: 2 - Symptomatic, <50% confined to bed  . Vitals:   09/29/22 1420 09/29/22 1422  BP: (!) 155/86 (!) 151/74  Pulse: 86   Resp: 17   Temp: 98 F (36.7 C)   SpO2: 100%       Filed Weights   09/29/22 1420  Weight: 204 lb (92.5 kg)      .Body mass index is 29.27 kg/m. NAD GENERAL:alert, in no acute distress and comfortable SKIN: no acute rashes, no significant lesions EYES: conjunctiva are pink and non-injected, sclera anicteric NECK: supple, no JVD LYMPH:  no palpable lymphadenopathy in the cervical, axillary or inguinal regions LUNGS: clear to auscultation b/l with normal respiratory effort HEART: regular rate & rhythm ABDOMEN:  normoactive bowel sounds , not distended. Right pectoral tenderness. Extremity: no pedal edema PSYCH: alert & oriented x 3 with fluent speech NEURO: no focal motor/sensory  deficits  LABORATORY DATA:  I have reviewed the data as listed  .    Latest Ref Rng & Units 09/15/2022    1:24 PM 09/08/2022    8:01 AM 09/07/2022    4:00 AM  CBC  WBC 4.0 - 10.5 K/uL 4.5  6.8  7.4   Hemoglobin 13.0 - 17.0 g/dL 9.7  9.6  01.0   Hematocrit 39.0 - 52.0 % 29.9  30.5  31.3   Platelets 150 - 400 K/uL 163  101  97     .    Latest Ref Rng & Units 09/15/2022    1:24 PM  09/08/2022    8:01 AM 09/07/2022    4:00 AM  CMP  Glucose 70 - 99 mg/dL 272  536  644   BUN 8 - 23 mg/dL 31  29  27    Creatinine 0.61 - 1.24 mg/dL 0.34  7.42  5.95   Sodium 135 - 145 mmol/L 137  133  133   Potassium 3.5 - 5.1 mmol/L 4.3  4.2  3.7   Chloride 98 - 111 mmol/L 105  103  101   CO2 22 - 32 mmol/L 26  22  21    Calcium 8.9 - 10.3 mg/dL 8.7  8.5  8.4   Total Protein 6.5 - 8.1 g/dL 7.4     Total Bilirubin 0.3 - 1.2 mg/dL 0.4     Alkaline Phos 38 - 126 U/L 75     AST 15 - 41 U/L 19     ALT 0 - 44 U/L 19        RADIOGRAPHIC STUDIES: I have personally reviewed the radiological images as listed and agreed with the findings in the report. CT CHEST ABDOMEN PELVIS W CONTRAST  Result Date: 09/05/2022 CLINICAL DATA:  Polytrauma, blunt.  History of multiple myeloma. EXAM: CT CHEST, ABDOMEN, AND PELVIS WITH CONTRAST TECHNIQUE: Multidetector CT imaging of the chest, abdomen and pelvis was performed following the standard protocol during bolus administration of intravenous contrast. RADIATION DOSE REDUCTION: This exam was performed according to the departmental dose-optimization program which includes automated exposure control, adjustment of the mA and/or kV according to patient size and/or use of iterative reconstruction technique. CONTRAST:  75mL OMNIPAQUE IOHEXOL 350 MG/ML SOLN COMPARISON:  Head CT 07/11/2021 FINDINGS: CHEST: Cardiovascular: Left chest wall triple lead pacemaker. No aortic injury. The thoracic aorta is normal in caliber. The heart is enlarged in size. No significant pericardial effusion. Mitral annular calcification. 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 Atherosclerotic plaque. No central pulmonary embolus. The main pulmonary artery is enlarged measuring up to 3.3 cm. Mediastinum/Nodes: No pneumomediastinum. No mediastinal hematoma. The esophagus is unremarkable. The thyroid is unremarkable. The central airways are patent. No  mediastinal, hilar, or axillary lymphadenopathy. Lungs/Pleura: Bilateral lower lobe subsegmental atelectasis. No focal consolidation. No pulmonary nodule. No pulmonary mass. No pulmonary contusion or laceration. No pneumatocele formation. No pleural effusion. No pneumothorax. No hemothorax. Musculoskeletal/Chest wall: No chest wall mass. Interval development of asymmetric right greater than left gynecomastia. Acute displaced right posterolateral sixth rib fracture. No acute sternal fracture. No spinal fracture. ABDOMEN / PELVIS: Hepatobiliary: Not enlarged. No focal lesion. No laceration or subcapsular hematoma. The gallbladder is otherwise unremarkable with no radio-opaque gallstones. No biliary ductal dilatation. Pancreas: Normal pancreatic contour. No main pancreatic duct dilatation. Spleen: Not enlarged. No focal lesion. No laceration, subcapsular hematoma, or vascular injury. Adrenals/Urinary Tract: No nodularity bilaterally. Bilateral kidneys enhance symmetrically. No hydronephrosis. No contusion, laceration, or  subcapsular hematoma. Fluid density lesions within the kidneys likely represent simple renal cysts. Simple renal cysts, in the absence of clinically indicated signs/symptoms, require no independent follow-up. No injury to the vascular structures or collecting systems. No hydroureter. The urinary bladder is unremarkable. Stomach/Bowel: No small or large bowel wall thickening or dilatation. Colonic diverticulosis. The appendix is not definitely identified with no inflammatory changes in the right lower quadrant to suggest acute appendicitis. Vasculature/Lymphatics: No abdominal aorta or iliac aneurysm. No active contrast extravasation or pseudoaneurysm. No abdominal, pelvic, inguinal lymphadenopathy. Reproductive: Enlarged prostate measuring up to 4.8 cm. Other: No simple free fluid ascites. No pneumoperitoneum. No hemoperitoneum. No mesenteric hematoma identified. No organized fluid collection.  Musculoskeletal: No significant soft tissue hematoma. No acute pelvic fracture. No spinal fracture. Ports and Devices: None. IMPRESSION: 1. Acute displaced right posterolateral sixth rib fracture. No associated pneumothorax. 2. No acute intrathoracic, intra-abdominal, intrapelvic traumatic injury. 3. No acute fracture or traumatic malalignment of the thoracic or lumbar spine. Other imaging findings of potential clinical significance: 1. Interval development of asymmetric right greater than left gynecomastia. Recommend mammographic evaluation. Underlying malignancy not excluded. 2. Prostatomegaly. 3. Colonic diverticulosis with no acute diverticulitis. 4. Cardiomegaly. 5. Enlarged main pulmonary artery suggestive of pulmonary hypertension. These images were dictated when made available. These results were called by telephone at the time of interpretation on 09/05/2022 at 10:33 pm to provider ADAM CURATOLO , who verbally acknowledged these results. Electronically Signed   By: Tish Frederickson M.D.   On: 09/05/2022 22:48   CT HEAD WO CONTRAST  Addendum Date: 09/05/2022   ADDENDUM REPORT: 09/05/2022 22:35 ADDENDUM: These results were called by telephone at the time of interpretation on 09/05/2022 at 10:33 pm to provider ADAM CURATOLO , who verbally acknowledged these results. Electronically Signed   By: Tish Frederickson M.D.   On: 09/05/2022 22:35   Result Date: 09/05/2022 CLINICAL DATA:  Head trauma, moderate-severe; Polytrauma, blunt; Facial trauma, blunt EXAM: CT HEAD WITHOUT CONTRAST CT MAXILLOFACIAL WITHOUT CONTRAST CT CERVICAL SPINE WITHOUT CONTRAST TECHNIQUE: Multidetector CT imaging of the head, cervical spine, and maxillofacial structures were performed using the standard protocol without intravenous contrast. Multiplanar CT image reconstructions of the cervical spine and maxillofacial structures were also generated. RADIATION DOSE REDUCTION: This exam was performed according to the departmental  dose-optimization program which includes automated exposure control, adjustment of the mA and/or kV according to patient size and/or use of iterative reconstruction technique. COMPARISON:  None Available. FINDINGS: CT HEAD FINDINGS Brain: Cerebral ventricle sizes are concordant with the degree of cerebral volume loss. No evidence of large-territorial acute infarction. No parenchymal hemorrhage. No mass lesion. No extra-axial collection. No mass effect or midline shift. No hydrocephalus. Basilar cisterns are patent. Vascular: No hyperdense vessel. Atherosclerotic calcifications are present within the cavernous internal carotid and vertebral arteries. Skull: No acute fracture or focal lesion. Other: Left scalp large hematoma formation measuring up to 2.1 cm. CT MAXILLOFACIAL FINDINGS Osseous: No fracture or mandibular dislocation. No destructive process. Sinuses/Orbits: Paranasal sinuses and mastoid air cells are clear. Bilateral lens replacement. The orbits are unremarkable. Soft tissues: Negative. CT CERVICAL SPINE FINDINGS Alignment: Normal. Skull base and vertebrae: Multilevel moderate severe degenerative changes spine with associated moderate severe osseous neural foraminal stenosis at the right C3-C4, C4-C5 levels. Acute displaced and comminuted C6 spinous process fracture. No aggressive appearing focal osseous lesion or focal pathologic process. Soft tissues and spinal canal: No prevertebral fluid or swelling. No visible canal hematoma. Upper chest: Unremarkable. Other: Likely posterior nuchal hematoma.  IMPRESSION: 1. No acute intracranial abnormality. 2. No acute displaced facial fracture. 3. Acute displaced and comminuted C6 spinous process fracture. Associated likely nuchal hematoma. Recommend MRI for further evaluation of the ligaments. 4. Large left scalp hematoma measuring up to 2.1 cm. These images were dictated when made available. Electronically Signed: By: Tish Frederickson M.D. On: 09/05/2022 22:26    CT MAXILLOFACIAL WO CONTRAST  Addendum Date: 09/05/2022   ADDENDUM REPORT: 09/05/2022 22:35 ADDENDUM: These results were called by telephone at the time of interpretation on 09/05/2022 at 10:33 pm to provider ADAM CURATOLO , who verbally acknowledged these results. Electronically Signed   By: Tish Frederickson M.D.   On: 09/05/2022 22:35   Result Date: 09/05/2022 CLINICAL DATA:  Head trauma, moderate-severe; Polytrauma, blunt; Facial trauma, blunt EXAM: CT HEAD WITHOUT CONTRAST CT MAXILLOFACIAL WITHOUT CONTRAST CT CERVICAL SPINE WITHOUT CONTRAST TECHNIQUE: Multidetector CT imaging of the head, cervical spine, and maxillofacial structures were performed using the standard protocol without intravenous contrast. Multiplanar CT image reconstructions of the cervical spine and maxillofacial structures were also generated. RADIATION DOSE REDUCTION: This exam was performed according to the departmental dose-optimization program which includes automated exposure control, adjustment of the mA and/or kV according to patient size and/or use of iterative reconstruction technique. COMPARISON:  None Available. FINDINGS: CT HEAD FINDINGS Brain: Cerebral ventricle sizes are concordant with the degree of cerebral volume loss. No evidence of large-territorial acute infarction. No parenchymal hemorrhage. No mass lesion. No extra-axial collection. No mass effect or midline shift. No hydrocephalus. Basilar cisterns are patent. Vascular: No hyperdense vessel. Atherosclerotic calcifications are present within the cavernous internal carotid and vertebral arteries. Skull: No acute fracture or focal lesion. Other: Left scalp large hematoma formation measuring up to 2.1 cm. CT MAXILLOFACIAL FINDINGS Osseous: No fracture or mandibular dislocation. No destructive process. Sinuses/Orbits: Paranasal sinuses and mastoid air cells are clear. Bilateral lens replacement. The orbits are unremarkable. Soft tissues: Negative. CT CERVICAL SPINE  FINDINGS Alignment: Normal. Skull base and vertebrae: Multilevel moderate severe degenerative changes spine with associated moderate severe osseous neural foraminal stenosis at the right C3-C4, C4-C5 levels. Acute displaced and comminuted C6 spinous process fracture. No aggressive appearing focal osseous lesion or focal pathologic process. Soft tissues and spinal canal: No prevertebral fluid or swelling. No visible canal hematoma. Upper chest: Unremarkable. Other: Likely posterior nuchal hematoma. IMPRESSION: 1. No acute intracranial abnormality. 2. No acute displaced facial fracture. 3. Acute displaced and comminuted C6 spinous process fracture. Associated likely nuchal hematoma. Recommend MRI for further evaluation of the ligaments. 4. Large left scalp hematoma measuring up to 2.1 cm. These images were dictated when made available. Electronically Signed: By: Tish Frederickson M.D. On: 09/05/2022 22:26   CT CERVICAL SPINE WO CONTRAST  Addendum Date: 09/05/2022   ADDENDUM REPORT: 09/05/2022 22:35 ADDENDUM: These results were called by telephone at the time of interpretation on 09/05/2022 at 10:33 pm to provider ADAM CURATOLO , who verbally acknowledged these results. Electronically Signed   By: Tish Frederickson M.D.   On: 09/05/2022 22:35   Result Date: 09/05/2022 CLINICAL DATA:  Head trauma, moderate-severe; Polytrauma, blunt; Facial trauma, blunt EXAM: CT HEAD WITHOUT CONTRAST CT MAXILLOFACIAL WITHOUT CONTRAST CT CERVICAL SPINE WITHOUT CONTRAST TECHNIQUE: Multidetector CT imaging of the head, cervical spine, and maxillofacial structures were performed using the standard protocol without intravenous contrast. Multiplanar CT image reconstructions of the cervical spine and maxillofacial structures were also generated. RADIATION DOSE REDUCTION: This exam was performed according to the departmental dose-optimization program which includes automated  exposure control, adjustment of the mA and/or kV according to patient  size and/or use of iterative reconstruction technique. COMPARISON:  None Available. FINDINGS: CT HEAD FINDINGS Brain: Cerebral ventricle sizes are concordant with the degree of cerebral volume loss. No evidence of large-territorial acute infarction. No parenchymal hemorrhage. No mass lesion. No extra-axial collection. No mass effect or midline shift. No hydrocephalus. Basilar cisterns are patent. Vascular: No hyperdense vessel. Atherosclerotic calcifications are present within the cavernous internal carotid and vertebral arteries. Skull: No acute fracture or focal lesion. Other: Left scalp large hematoma formation measuring up to 2.1 cm. CT MAXILLOFACIAL FINDINGS Osseous: No fracture or mandibular dislocation. No destructive process. Sinuses/Orbits: Paranasal sinuses and mastoid air cells are clear. Bilateral lens replacement. The orbits are unremarkable. Soft tissues: Negative. CT CERVICAL SPINE FINDINGS Alignment: Normal. Skull base and vertebrae: Multilevel moderate severe degenerative changes spine with associated moderate severe osseous neural foraminal stenosis at the right C3-C4, C4-C5 levels. Acute displaced and comminuted C6 spinous process fracture. No aggressive appearing focal osseous lesion or focal pathologic process. Soft tissues and spinal canal: No prevertebral fluid or swelling. No visible canal hematoma. Upper chest: Unremarkable. Other: Likely posterior nuchal hematoma. IMPRESSION: 1. No acute intracranial abnormality. 2. No acute displaced facial fracture. 3. Acute displaced and comminuted C6 spinous process fracture. Associated likely nuchal hematoma. Recommend MRI for further evaluation of the ligaments. 4. Large left scalp hematoma measuring up to 2.1 cm. These images were dictated when made available. Electronically Signed: By: Tish Frederickson M.D. On: 09/05/2022 22:26   CT L-SPINE NO CHARGE  Result Date: 09/05/2022 CLINICAL DATA:  [id number] Pain [id number] EXAM: CT THORACIC AND LUMBAR SPINE  WITHOUT CONTRAST TECHNIQUE: Multidetector CT imaging of the thoracic and lumbar spine was performed without contrast. Multiplanar CT image reconstructions were also generated. RADIATION DOSE REDUCTION: This exam was performed according to the departmental dose-optimization program which includes automated exposure control, adjustment of the mA and/or kV according to patient size and/or use of iterative reconstruction technique. COMPARISON:  PetCT 07/11/2021, CT lumbar spine 09/27/2018 FINDINGS: CT THORACIC SPINE FINDINGS Alignment: Normal. Vertebrae: Multilevel moderate degenerative changes spine most prominent at the T7-T8 level. No acute fracture or focal pathologic process. Paraspinal and other soft tissues: Negative. Disc levels: Maintained. CT LUMBAR SPINE FINDINGS Segmentation: 5 lumbar type vertebrae. Alignment: Straightening of normal lumbar lordosis likely due to surgical hardware and degenerative changes. Vertebrae: Multilevel severe degenerative changes of the spine. Status post laminectomy at the L3 through L5 levels with associated chronic similar-appearing cortical erosion and destruction. Interbody surgical hardware fusion at the L2-L3, L3-L4, L4-L5 levels. Bilateral posterior L2 surgical hardware. Posterior L3 surgical hardware removed. Posterior L4 surgical hardware partially removed with retained partial screws. Posterior left S1 screws removed. Posterior right S1 surgical hardware partially removed with partial retained screw. Right sacroiliac joint surgical hardware. No acute fracture or focal pathologic process. Paraspinal and other soft tissues: Negative. Disc levels: Non-surgerized levels demonstrate at least moderate intervertebral disc space narrowing. IMPRESSION: CT THORACIC SPINE IMPRESSION 1. No acute displaced fracture or traumatic listhesis of the thoracic spine. CT LUMBAR SPINE IMPRESSION 1. No acute displaced fracture or traumatic listhesis of the lumbar spine. 2. Lumbosacral surgical  hardware with partial removal of the surgical hardware. 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 Electronically Signed   By: Tish Frederickson M.D.   On: 09/05/2022 22:33   CT T-SPINE NO CHARGE  Result Date: 09/05/2022 CLINICAL DATA:  [id number] Pain [id number] EXAM:  CT THORACIC AND LUMBAR SPINE WITHOUT CONTRAST TECHNIQUE: Multidetector CT imaging of the thoracic and lumbar spine was performed without contrast. Multiplanar CT image reconstructions were also generated. RADIATION DOSE REDUCTION: This exam was performed according to the departmental dose-optimization program which includes automated exposure control, adjustment of the mA and/or kV according to patient size and/or use of iterative reconstruction technique. COMPARISON:  PetCT 07/11/2021, CT lumbar spine 09/27/2018 FINDINGS: CT THORACIC SPINE FINDINGS Alignment: Normal. Vertebrae: Multilevel moderate degenerative changes spine most prominent at the T7-T8 level. No acute fracture or focal pathologic process. Paraspinal and other soft tissues: Negative. Disc levels: Maintained. CT LUMBAR SPINE FINDINGS Segmentation: 5 lumbar type vertebrae. Alignment: Straightening of normal lumbar lordosis likely due to surgical hardware and degenerative changes. Vertebrae: Multilevel severe degenerative changes of the spine. Status post laminectomy at the L3 through L5 levels with associated chronic similar-appearing cortical erosion and destruction. Interbody surgical hardware fusion at the L2-L3, L3-L4, L4-L5 levels. Bilateral posterior L2 surgical hardware. Posterior L3 surgical hardware removed. Posterior L4 surgical hardware partially removed with retained partial screws. Posterior left S1 screws removed. Posterior right S1 surgical hardware partially removed with partial retained screw. Right sacroiliac joint surgical hardware. No acute fracture or focal pathologic process.  Paraspinal and other soft tissues: Negative. Disc levels: Non-surgerized levels demonstrate at least moderate intervertebral disc space narrowing. IMPRESSION: CT THORACIC SPINE IMPRESSION 1. No acute displaced fracture or traumatic listhesis of the thoracic spine. CT LUMBAR SPINE IMPRESSION 1. No acute displaced fracture or traumatic listhesis of the lumbar spine. 2. Lumbosacral surgical hardware with partial removal of the surgical hardware. 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 Electronically Signed   By: Tish Frederickson M.D.   On: 09/05/2022 22:33   DG Shoulder Left  Result Date: 09/05/2022 CLINICAL DATA:  pain EXAM: LEFT SHOULDER - 2+ VIEW COMPARISON:  CT chest 09/05/2022, PET CT 07/11/2021 FINDINGS: There is no evidence of fracture or dislocation. Mild to moderate degenerative changes of the glenohumeral joint. Mild degenerative changes acromioclavicular joint. Soft tissues are unremarkable. Atherosclerotic plaque IMPRESSION: 1. No acute displaced fracture or dislocation. 2.  Aortic Atherosclerosis (ICD10-I70.0). Electronically Signed   By: Tish Frederickson M.D.   On: 09/05/2022 21:49   DG Pelvis Portable  Result Date: 09/05/2022 CLINICAL DATA:  Trauma EXAM: PORTABLE PELVIS 1-2 VIEWS COMPARISON:  X-ray pelvis 11/15/2020 FINDINGS: Surgical hardware of the right sacroiliac joint. Surgical hardware visualized lower lumbar spine. There is no evidence of pelvic fracture or diastasis. No acute displaced fracture or dislocation of either hips. No pelvic bone lesions are seen. IMPRESSION: Negative for acute traumatic injury. Electronically Signed   By: Tish Frederickson M.D.   On: 09/05/2022 21:48   DG Knee Complete 4 Views Right  Result Date: 09/05/2022 CLINICAL DATA:  Pain.  Trauma. EXAM: RIGHT KNEE - COMPLETE 4+ VIEW COMPARISON:  None Available. FINDINGS: No evidence of fracture or dislocation. Small joint  effusion. Old healed proximal tibial shaft fracture. Mild tricompartmental degenerative changes spine. No evidence of severe arthropathy or other focal bone abnormality. Soft tissues are unremarkable. Vascular calcification. IMPRESSION: 1. No acute displaced fracture or dislocation. 2. Small joint effusion. Electronically Signed   By: Tish Frederickson M.D.   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 Cardiomegaly. Otherwise the heart and mediastinal contours  are within normal limits. No focal consolidation. No pulmonary edema. No pleural effusion. No pneumothorax. Acute displaced right posterior sixth rib fracture. IMPRESSION: Acute displaced right posterior sixth rib fracture. Electronically Signed   By: Tish Frederickson M.D.   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 and less likely to the monoclonal paraproteinemia.  Also patient has ischemic cardiomyopathy which is likely explanation for his congestive heart failure along with decreasing diuretics and seems to be less likely related to AL amyloidosis clinically.  PLAN:  -Discussed lab results from 09/15/2022 in detail with patient. CBC showed WBC of 4.5K, hemoglobin of 9.7, and platelets of 163K. -CBC previously normal. Currently shows mild anemia which is likely related to hematoma -platelet levels have normalized since his mower accident injury -CMP shows improvement in  creatinine levels. Creatinine was previously 2.07 on 09/08/2022 and improved to 1.86 on 09/15/2022 -myeloma lab shows stable M spike over the last 6 months -Patient has no clinical or lab findings suggestive of active myeloma at this time requiring treatment -will continue to monitor with labs every 6 months  FOLLOW-UP: Labs in 22 weeks RTC with Dr Candise Che in 24 weeks    The total time spent in the appointment was 20 minutes* .  All of the patient's questions were answered with apparent satisfaction. The patient knows to call the clinic with any problems, questions or concerns.   Wyvonnia Lora MD MS AAHIVMS [facility] Hematology/Oncology Physician [facility]  .*Total Encounter Time as defined by the Centers for Medicare and Medicaid Services includes, in addition to the face-to-face time of a patient visit (documented in the note above) non-face-to-face time: obtaining and reviewing outside history, ordering and reviewing medications, tests or procedures, care coordination (communications with other health care professionals or caregivers) and documentation in the medical record.    I,Mitra Faeizi,acting as a Neurosurgeon for Wyvonnia Lora, MD.,have documented all relevant documentation on the behalf of Wyvonnia Lora, MD,as directed by  Wyvonnia Lora, MD while in the presence of Wyvonnia Lora, MD.  .I have reviewed the above documentation for accuracy and completeness, and I agree with the above. Johney Maine MD

## 2022-10-03 ENCOUNTER — Ambulatory Visit (INDEPENDENT_AMBULATORY_CARE_PROVIDER_SITE_OTHER): Payer: Medicare HMO

## 2022-10-03 DIAGNOSIS — I442 Atrioventricular block, complete: Secondary | ICD-10-CM | POA: Diagnosis not present

## 2022-10-03 LAB — CUP PACEART REMOTE DEVICE CHECK
Battery Impedance: 1533 Ohm
Battery Remaining Longevity: 44 mo
Battery Voltage: 2.76 V
Brady Statistic AP VP Percent: 14 %
Brady Statistic AP VS Percent: 86 %
Brady Statistic AS VP Percent: 0 %
Brady Statistic AS VS Percent: 0 %
Date Time Interrogation Session: 20240827103448
Implantable Lead Connection Status: 753985
Implantable Lead Connection Status: 753985
Implantable Lead Implant Date: 20080208
Implantable Lead Implant Date: 20140130
Implantable Lead Location: 753859
Implantable Lead Location: 753860
Implantable Lead Model: 5076
Implantable Lead Model: 5076
Implantable Pulse Generator Implant Date: 20140130
Lead Channel Impedance Value: 475 Ohm
Lead Channel Impedance Value: 572 Ohm
Lead Channel Pacing Threshold Amplitude: 0.625 V
Lead Channel Pacing Threshold Amplitude: 0.875 V
Lead Channel Pacing Threshold Pulse Width: 0.4 ms
Lead Channel Pacing Threshold Pulse Width: 0.4 ms
Lead Channel Setting Pacing Amplitude: 2 V
Lead Channel Setting Pacing Amplitude: 2.5 V
Lead Channel Setting Pacing Pulse Width: 0.4 ms
Lead Channel Setting Sensing Sensitivity: 2.8 mV
Zone Setting Status: 755011
Zone Setting Status: 755011

## 2022-10-10 ENCOUNTER — Telehealth: Payer: Self-pay | Admitting: Pharmacy Technician

## 2022-10-10 ENCOUNTER — Other Ambulatory Visit (HOSPITAL_COMMUNITY): Payer: Self-pay

## 2022-10-10 ENCOUNTER — Ambulatory Visit: Payer: Medicare HMO | Attending: Cardiology | Admitting: Student

## 2022-10-10 ENCOUNTER — Telehealth: Payer: Self-pay | Admitting: Pharmacist

## 2022-10-10 DIAGNOSIS — E7800 Pure hypercholesterolemia, unspecified: Secondary | ICD-10-CM

## 2022-10-10 DIAGNOSIS — E785 Hyperlipidemia, unspecified: Secondary | ICD-10-CM

## 2022-10-10 MED ORDER — ROSUVASTATIN CALCIUM 40 MG PO TABS: 40.0000 mg | ORAL_TABLET | Freq: Every day | ORAL | 3 refills | Status: DC

## 2022-10-10 NOTE — Telephone Encounter (Signed)
Pharmacy Patient Advocate Encounter   Received notification from Pt Calls Messages that prior authorization for ozempic is required/requested.   Insurance verification completed.   The patient is insured through [employer].   Per test claim: The current 10/10/22 day co-pay is, $238.18 - pt is in coverage GAP.  No PA needed at this time. This test claim was processed through [facility]- copay amounts may vary at other pharmacies due to pharmacy/plan contracts, or as the patient moves through the different stages of their insurance plan.

## 2022-10-10 NOTE — Progress Notes (Signed)
Patient ID: Alan [name]                 DOB: October 12, 1940                    MRN: [id number]      HPI: Alan [name] is a 82 y.o. male patient referred to lipid clinic by Alan Littler, NP. PMH is significant for CAD s/p redo CABG 2004 with stable angina, hx of complete heart block, CHF class-II, PAF, hx of GI bleed, CKD, stage 3b GFR 30-44 mL/min, dyslipidemia,statin intolerance.    Last lipid profile on 01/12/22 indicated Cholesterol 159, Triglycerides 101, HDL 46 and LDL 94. Intolerant to statin and Zetia. Hemoglobin A1C 6.6 on 07/05/22. On Jardiance, Glipizide, and Insulin. Managed and monitored by PCP Patient presented today for lipid clinic. Reports he has tried Statins many years ago and does not recall why was it stopped. He currently on one brand name medication (Jardiance 10 mg) he is unable to afford that as he is in coverage gap. He gets samples from his nephrologist.  We discussed GLP1 to optimize his diabetes medications and discussed statins, Zetia and PCSK9i in details   Discussed mechanisms of action, dosing, side effects and potential decreases in LDL cholesterol potential reduction in BG from GLP1 therapy and .  Also reviewed cost information and potential options for patient assistance.    Current Medications: none  Intolerances: patient does not recall why was statin stopped  Risk Factors: CAD s/p redo CABG 2004 with stable angina, hx of complete heart block, CHF class-II,CKD, stage 3b GFR 30-44 mL/min, dyslipidemia LDL goal: <55 mg/dl given age 7 can consider lenient goal < 70 mg/dl   Diet: eats what ever he likes to likes to eat   Exercise: walks around [place]   Social History:  Alcohol: none  Smoking: never  Labs: Lipid Panel     Component Value Date/Time   CHOL 134 12/20/2010 0903   TRIG 104 12/20/2010 0903   HDL 38 (L) 12/20/2010 0903   CHOLHDL 3.5 12/20/2010 0903   VLDL 21 12/20/2010 0903   LDLCALC 75 12/20/2010 0903    Past Medical History:   Diagnosis Date   Anticoagulant long-term use    Arthritis    Arthropathy of left shoulder    Atypical mole    Biceps tendonitis, left    Bursitis of left shoulder    Cardiac pacemaker in situ    first insertion 2008;  genertor and new lead change 03-08-2012;   [employer]   CHB (complete heart block) (HCC)    s/p  PPM 2008   Chronic back pain    CKD (chronic kidney disease), stage III [facility])    Coronary artery disease cardiologist--- dr croitoru   hx  CABG x2  1990 and re-do 2004;  multiple PCI to RCA 1990 to 2004;    cardiac cath 2012  occluded LAD and RCA with patent grafts   Difficult intubation    " Dr. Royann Shivers said that it was difficulty to get the tube in."   History of coronary angioplasty    multiple PCI to RCA ,  1990 to 2004   History of GI bleed followed by GI-- Everette Rank PA @ [facility] [place]   01/ 2020  upper and lower GI bleed ;  s/p  EGD with cautery of jejunal bleed and blood transfusion's   History of kidney stones    Hx of echocardiogram 06/29/2008  EF 45-50%    Hypertension    Ischemic cardiomyopathy    previously reported , ef 40-45%;  2010--- ef 45-50%;   last echo 2017 55-60%   Mixed hyperlipidemia    Nocturia    OSA on CPAP    cpap, 12, last sleep study Jan2013, sees Dr. Fannie Knee   PAF (paroxysmal atrial fibrillation) (HCC)    long hx PAF--- followed by dr croitoru   Post-polio syndrome    dx polio age 68;   09-30-2018  per pt occasional gait issues and occasion fall   Presence of permanent cardiac pacemaker    Pseudoarthrosis of lumbar spine    Rotator cuff tear, left    S/P CABG x 2    1990 x2  and 2004  x2   Sinus node dysfunction (HCC)    2015--- sinus node arrest  with intact AV conduction   Squamous cell carcinoma of skin 06/14/2021   Mid Parietal Scalp (in situ) (tx p bx)   Type 2 diabetes mellitus (HCC)    followed by pcp    Current Outpatient Medications on File Prior to Visit  Medication Sig Dispense Refill    amiodarone (PACERONE) 200 MG tablet Take 200 mg by mouth daily.     carvedilol (COREG) 25 MG tablet Take 25 mg by mouth 2 (two) times daily with a meal.     clopidogrel (PLAVIX) 75 MG tablet Take 1 tablet (75 mg total) by mouth daily. 90 tablet 3   Cyanocobalamin (VITAMIN B-12 PO) Take 1 tablet by mouth daily.     DULoxetine (CYMBALTA) 60 MG capsule Take 60 mg by mouth daily.     empagliflozin (JARDIANCE) 10 MG TABS tablet Take 1 tablet (10 mg total) by mouth daily. 90 tablet 3   glipiZIDE (GLUCOTROL) 10 MG tablet Take 10 mg by mouth 2 (two) times daily.     ibuprofen (ADVIL) 200 MG tablet Take 400 mg by mouth 2 (two) times daily as needed for headache or moderate pain.     insulin lispro (HUMALOG) 100 UNIT/ML injection Inject 0-10 Units into the skin 2 (two) times daily as needed for high blood sugar. Per sliding scale.     Multiple Vitamin (MULTIVITAMIN WITH MINERALS) TABS tablet Take 1 tablet by mouth daily.      oxyCODONE (OXY IR/ROXICODONE) 5 MG immediate release tablet Take 1 tablet (5 mg total) by mouth every 6 (six) hours as needed for moderate pain. 20 tablet 0   torsemide (DEMADEX) 10 MG tablet Take 5 mg by mouth daily.     triamcinolone cream (KENALOG) 0.1 % Apply 1 Application topically 2 (two) times daily as needed (rough skin patches).     No current facility-administered medications on file prior to visit.    No Known Allergies  Assessment/Plan:  1. Hyperlipidemia -  Problem  Dyslipidemia    Current Medications: none  Intolerances: statin and Zetia  Risk Factors: CAD s/p redo CABG 2004 with stable angina, hx of complete heart block, CHF class-II,CKD, stage 3b GFR 30-44 mL/min, dyslipidemia, LDL goal: <55 mg/dl given age 65 can consider lenient goal < 70 mg/dl     Dyslipidemia Assessment:  LDL goal: <55 mg/dl last 56/2/13 indicated Cholesterol 159, Triglycerides 101, HDL 46 and LDL 94  Statin intolerance reported in the past but patient does not recall why statins was  discontinued and patient is unable to afford any brand name injectable therapy due to him being in coverage gap  Patient want to try  statin again. Will try with higher intensity if not tolerated well will reduce dose to moderate or lower - start Crestor 40 mg daily if not tolerated try 20 mg daily or every other day or even 5 mg daily  PharmD will follow up via phone in 2 weeks to assess tolerability  In future can consider adding Zetia or PCSK9i to max tolerated statins       Thank you,  Carmela Hurt, Pharm.D [facility] A Division of [facility] [address] [place], [place] [address]  Phone: [phone]; Fax: [phone]

## 2022-10-10 NOTE — Patient Instructions (Signed)
Your Results:             Your most recent labs Goal  Total Cholesterol 159 < 200  Triglycerides 101 < 150  HDL (happy/good cholesterol) 46 > 40  LDL (lousy/bad cholesterol 94 < 55   Medication changes: start taking Rosuvastatin 40 mg daily  Will call in 2 weeks to assess tolerability        Lab orders: We want to repeat labs after 3 months.  We will send you a lab order to remind you once we get closer to that time.

## 2022-10-10 NOTE — Telephone Encounter (Signed)
PA request has been Cancelled. New Encounter created for follow up. For additional info see Pharmacy Prior Auth telephone encounter from 10/10/22.

## 2022-10-10 NOTE — Telephone Encounter (Signed)
Pharmacy Patient Advocate Encounter   Received notification from Pt Calls Messages that prior authorization for mounjaro is required/requested.   Insurance verification completed.   The patient is insured through [employer].   Per test claim: The current 10/10/22 day co-pay is, $262.92- pt is in coverage GAP.  No PA needed at this time. This test claim was processed through [facility]- copay amounts may vary at other pharmacies due to pharmacy/plan contracts, or as the patient moves through the different stages of their insurance plan.

## 2022-10-11 ENCOUNTER — Encounter: Payer: Self-pay | Admitting: Student

## 2022-10-11 NOTE — Progress Notes (Signed)
Remote pacemaker transmission.   

## 2022-10-11 NOTE — Assessment & Plan Note (Addendum)
Assessment:  LDL goal: <55 mg/dl last 13/0/86 indicated Cholesterol 159, Triglycerides 101, HDL 46 and LDL 94  Statin intolerance reported in the past but patient does not recall why statins was discontinued and patient is unable to afford any brand name injectable therapy due to him being in coverage gap  Patient want to try statin again. Will try with higher intensity if not tolerated well will reduce dose to moderate or lower - start Crestor 40 mg daily if not tolerated try 20 mg daily or every other day or even 5 mg daily  PharmD will follow up via phone in 2 weeks to assess tolerability  In future can consider adding Zetia or PCSK9i to max tolerated statins

## 2022-10-20 ENCOUNTER — Ambulatory Visit (HOSPITAL_COMMUNITY): Payer: Medicare HMO | Attending: Cardiology

## 2022-10-20 DIAGNOSIS — I5032 Chronic diastolic (congestive) heart failure: Secondary | ICD-10-CM | POA: Diagnosis present

## 2022-10-20 LAB — ECHOCARDIOGRAM COMPLETE
Calc EF: 55.2 %
S' Lateral: 3.9 cm
Single Plane A2C EF: 57.9 %
Single Plane A4C EF: 49.6 %

## 2022-10-20 MED ORDER — PERFLUTREN LIPID MICROSPHERE
1.0000 mL | INTRAVENOUS | Status: AC | PRN
Start: 2022-10-20 — End: 2022-10-20
  Administered 2022-10-20: 2 mL via INTRAVENOUS

## 2022-10-25 ENCOUNTER — Telehealth: Payer: Self-pay | Admitting: Pharmacist

## 2022-10-25 NOTE — Telephone Encounter (Signed)
Rosuvastatin 40 mg was started on 10/10/2022. Call to assess tolerability. N/A LVM.

## 2022-10-26 NOTE — Telephone Encounter (Signed)
Spoke to patient, he is unable to tolerate 40 mg Crestor or 20 mg or 10 mg Crestor - he feels tired and gets muscle cramps. Will hold Crestor for now and will try Zetia in 2 weeks.   Patient does not want to go on any brand name medication while he is in coverage gap.   Follow up via phone in 2 weeks

## 2022-11-01 ENCOUNTER — Telehealth: Payer: Self-pay | Admitting: Hematology

## 2022-11-01 NOTE — Telephone Encounter (Signed)
Patient is aware of scheduled appointment times/dates

## 2022-11-18 NOTE — Progress Notes (Unsigned)
HPI M never smoker followed for OSA, history UPPP. Complicated by Post polio, AFib/ pacemaker, CAD/CABG,  DM 2, HBP, gout,  DDD,      PCP Dr Cyndia Bent NPSG 05/30/97- AHI 20/ hr, desaturation to 78%, body weight 230 lbs Office Spirometry-02/26/2018- Mild Restriction of exhaled volume.  FVC 2.9/69%, FEV1 2.4/81%, ratio 1.84, FEF 25-75% 3.1/145%. -----------------------------------------------------------------------------  05/30/22- Alan [name] never smoker followed for OSA, history UPPP. Complicated by Post polio, AFib/ Pacemaker/ Coumadin, CAD/CABG/ CHF,  DM 2, CKD4, HBP, gout, PCP Dr Cyndia Bent, Covid infection 02/11/20, ?MGUS, CPAP 12/ Adapt-AutoSet AirSense 10   also has an AirSense 11 Download compliance - 100%, AHI 0.8/ hr Body weight today-208 lbs Download reviewed.  He is using CPAP regularly and realizes it helps but he admits he is "tired of it".  We discussed alternatives again.  We agreed that we would update his sleep study and refer him to meet providers of oral appliances and Inspire.  11/20/22- Alan [name] never smoker followed for OSA, history UPPP. Complicated by Post polio, AFib/ Pacemaker/ Watchman/Coumadin, CAD/CABG/ CHF,  DM 2, CKD4, HBP, gout, PCP Dr Cyndia Bent, Covid infection 02/11/20, ?MGUS, TIA,  CPAP 12/ Adapt-AutoSet AirSense 10   also has an AirSense 11 Download compliance - 90%, AHI 2.1/hr Body weight today-211 lbs ED 10/8- TIA. He says he tipped tractor over and was in a ditch with cracked ribs. Sore now but feels back to baseline. Download reviewed. He now is interested in [place] referral. Discussed.   ROS-HPI  + = positive Constitutional:   No-   weight loss, night sweats, fevers, chills, fatigue, lassitude. HEENT:   No-  headaches, difficulty swallowing, tooth/dental problems, sore throat,       No-  sneezing, itching, ear ache, nasal congestion, post nasal drip,  CV:  No-   chest pain, orthopnea, PND, swelling in lower extremities, anasarca, dizziness, palpitations Resp: +shortness of  breath with exertion or at rest,  cough            No-   No- coughing up of blood.              No-   change in color of mucus.  No- wheezing.   Skin: No-   rash or lesions. GI:  No-   heartburn, indigestion, abdominal pain, nausea, vomiting,  GU:  MS: + joint pain or swelling.  + back pain Neuro-     nothing unusual Psych:  No- change in mood or affect. No depression or anxiety.  No memory loss.  OBJ General- Alert, Oriented, Affect-appropriate, Distress- none acute, + overweight Skin- no rash Lymphadenopathy- none Head- atraumatic            Eyes- Gross vision intact, PERRLA, conjunctivae clear secretions            Ears- +Hard of hearing            Nose- Clear, no-Septal dev, mucus, polyps, erosion, perforation             Throat- Mallampati III/ +sp UPPP , mucosa clear , drainage- none, tonsils- atrophic Neck- flexible , trachea midline, no stridor , thyroid nl, carotid no bruit Chest - symmetrical excursion , unlabored           Heart/CV- RRR , no murmur , no gallop  , no rub, nl s1 s2                           - JVD-  none , edema- none, stasis changes- none, varices- none           Lung- +clear/ unlabored, wheeze- none, cough- none , dullness-none, rub- none           Chest wall-  + pacemaker L,  Abd- Br/ Gen/ Rectal- Not done, not indicated Extrem-  Neuro- grossly intact to observation

## 2022-11-20 ENCOUNTER — Encounter: Payer: Self-pay | Admitting: Internal Medicine

## 2022-11-20 ENCOUNTER — Ambulatory Visit: Payer: Medicare HMO | Admitting: Internal Medicine

## 2022-11-20 VITALS — BP 165/76 | HR 77 | Ht 70.0 in | Wt 211.9 lb

## 2022-11-20 DIAGNOSIS — I25718 Atherosclerosis of autologous vein coronary artery bypass graft(s) with other forms of angina pectoris: Secondary | ICD-10-CM

## 2022-11-20 DIAGNOSIS — Z23 Encounter for immunization: Secondary | ICD-10-CM | POA: Diagnosis not present

## 2022-11-20 DIAGNOSIS — G4733 Obstructive sleep apnea (adult) (pediatric): Secondary | ICD-10-CM | POA: Diagnosis not present

## 2022-11-20 NOTE — Patient Instructions (Signed)
Order- to [facility] ENT- consider Inspire for OSA  Order- Flu vax senior

## 2022-11-27 ENCOUNTER — Telehealth: Payer: Self-pay | Admitting: Pharmacy Technician

## 2022-11-27 ENCOUNTER — Encounter (INDEPENDENT_AMBULATORY_CARE_PROVIDER_SITE_OTHER): Payer: Self-pay | Admitting: Otolaryngology

## 2022-11-27 ENCOUNTER — Other Ambulatory Visit (HOSPITAL_COMMUNITY): Payer: Self-pay

## 2022-11-27 MED ORDER — EZETIMIBE 10 MG PO TABS: 10.0000 mg | ORAL_TABLET | Freq: Every day | ORAL | 3 refills | Status: DC

## 2022-11-27 NOTE — Telephone Encounter (Signed)
Spoke to patient, patient can not afford Repatha due to high co-pay,  once [facility] re-open will enroll him in the [facility]. For now he will start Zetia 10 mg daily

## 2022-11-27 NOTE — Addendum Note (Signed)
Addended by: Tylene Fantasia on: 11/27/2022 08:49 AM   Modules accepted: Orders

## 2022-11-27 NOTE — Telephone Encounter (Signed)
Pharmacy Patient Advocate Encounter   Received notification from Pt Calls Messages that prior authorization for repatha is required/requested.   Insurance verification completed.   The patient is insured through [employer] .   Per test claim: PA required; PA submitted to [employer] via CoverMyMeds Key/confirmation #/EOC [id number] Status is pending

## 2022-11-27 NOTE — Telephone Encounter (Signed)
Pharmacy Patient Advocate Encounter  Received notification from [employer] that Prior Authorization for repatha has been APPROVED from 11/27/22 to 02/06/23. Ran test claim, Copay is $138.05- one month (GAP) . This test claim was processed through [facility]- copay amounts may vary at other pharmacies due to pharmacy/plan contracts, or as the patient moves through the different stages of their insurance plan.   PA #/Case ID/Reference #: [id number]

## 2022-12-05 ENCOUNTER — Encounter (INDEPENDENT_AMBULATORY_CARE_PROVIDER_SITE_OTHER): Payer: Self-pay | Admitting: Otolaryngology

## 2022-12-09 ENCOUNTER — Encounter: Payer: Self-pay | Admitting: Internal Medicine

## 2022-12-09 NOTE — Assessment & Plan Note (Signed)
Significant cardiovascular hx- followed by cardiology

## 2022-12-09 NOTE — Assessment & Plan Note (Signed)
Benefits from CPAP but wants to explore Inspire as alternative. Plan- refer to ENT

## 2022-12-13 ENCOUNTER — Ambulatory Visit: Payer: Medicare HMO | Admitting: Cardiovascular Disease

## 2022-12-15 MED ORDER — REPATHA SURECLICK 140 MG/ML ~~LOC~~ SOAJ: 140.0000 mg | SUBCUTANEOUS | 2 refills | Status: DC

## 2022-12-15 NOTE — Addendum Note (Signed)
Addended by: Tylene Fantasia on: 12/15/2022 11:14 AM   Modules accepted: Orders

## 2022-12-18 ENCOUNTER — Encounter (HOSPITAL_COMMUNITY): Payer: Self-pay | Admitting: Cardiology

## 2022-12-18 ENCOUNTER — Ambulatory Visit (HOSPITAL_COMMUNITY)
Admission: RE | Admit: 2022-12-18 | Discharge: 2022-12-18 | Disposition: A | Discharge status: Home / Self Care | Payer: Medicare HMO | Source: Ambulatory Visit | Attending: Cardiology | Admitting: Cardiology

## 2022-12-18 VITALS — BP 138/70 | HR 62 | Wt 218.4 lb

## 2022-12-18 DIAGNOSIS — I130 Hypertensive heart and chronic kidney disease with heart failure and stage 1 through stage 4 chronic kidney disease, or unspecified chronic kidney disease: Secondary | ICD-10-CM | POA: Insufficient documentation

## 2022-12-18 DIAGNOSIS — Z7984 Long term (current) use of oral hypoglycemic drugs: Secondary | ICD-10-CM | POA: Insufficient documentation

## 2022-12-18 DIAGNOSIS — I2510 Atherosclerotic heart disease of native coronary artery without angina pectoris: Secondary | ICD-10-CM | POA: Diagnosis not present

## 2022-12-18 DIAGNOSIS — Z951 Presence of aortocoronary bypass graft: Secondary | ICD-10-CM | POA: Diagnosis not present

## 2022-12-18 DIAGNOSIS — I5032 Chronic diastolic (congestive) heart failure: Secondary | ICD-10-CM | POA: Diagnosis not present

## 2022-12-18 DIAGNOSIS — N184 Chronic kidney disease, stage 4 (severe): Secondary | ICD-10-CM | POA: Diagnosis not present

## 2022-12-18 DIAGNOSIS — Z79899 Other long term (current) drug therapy: Secondary | ICD-10-CM | POA: Insufficient documentation

## 2022-12-18 DIAGNOSIS — Z8673 Personal history of transient ischemic attack (TIA), and cerebral infarction without residual deficits: Secondary | ICD-10-CM | POA: Diagnosis not present

## 2022-12-18 DIAGNOSIS — C9000 Multiple myeloma not having achieved remission: Secondary | ICD-10-CM | POA: Insufficient documentation

## 2022-12-18 DIAGNOSIS — I480 Paroxysmal atrial fibrillation: Secondary | ICD-10-CM | POA: Insufficient documentation

## 2022-12-18 DIAGNOSIS — Z7901 Long term (current) use of anticoagulants: Secondary | ICD-10-CM | POA: Diagnosis not present

## 2022-12-18 LAB — CBC
HCT: 32.1 % — ABNORMAL LOW (ref 39.0–52.0)
Hemoglobin: 10.2 g/dL — ABNORMAL LOW (ref 13.0–17.0)
MCH: 30.9 pg (ref 26.0–34.0)
MCHC: 31.8 g/dL (ref 30.0–36.0)
MCV: 97.3 fL (ref 80.0–100.0)
Platelets: 55 10*3/uL — ABNORMAL LOW (ref 150–400)
RBC: 3.3 MIL/uL — ABNORMAL LOW (ref 4.22–5.81)
RDW: 14.1 % (ref 11.5–15.5)
WBC: 5.1 10*3/uL (ref 4.0–10.5)
nRBC: 0 % (ref 0.0–0.2)

## 2022-12-18 LAB — BRAIN NATRIURETIC PEPTIDE: B Natriuretic Peptide: 174 pg/mL — ABNORMAL HIGH (ref 0.0–100.0)

## 2022-12-18 MED ORDER — APIXABAN 2.5 MG PO TABS: 2.5000 mg | ORAL_TABLET | Freq: Two times a day (BID) | ORAL | 11 refills | Status: DC

## 2022-12-18 MED ORDER — SPIRONOLACTONE 25 MG PO TABS: 12.5000 mg | ORAL_TABLET | Freq: Every day | ORAL | 3 refills | Status: DC

## 2022-12-18 MED ORDER — TORSEMIDE 20 MG PO TABS: 10.0000 mg | ORAL_TABLET | Freq: Every day | ORAL | 3 refills | Status: DC

## 2022-12-18 NOTE — Progress Notes (Signed)
PCP: Eartha Inch, MD Cardiology: Dr. Royann Shivers HF Cardiology: Dr. Shirlee Latch  82 y.o. with history of complete heart block s/p pacemaker, CAD s/p CABG and redo, CKD stage 3, GI bleeding, and chronic diastolic CHF was referred to CHF clinic from the [facility] clinic. Patient has a long history of CAD. He had his initial CABG in 1990 then redo in 2004. Last cath in 11/20 showed patent sequential LIMA-LAD/D and SVG-PDA, medical management.  He has a Medtronic PPM due to complete heart block, has had this for years.  He has paroxysmal atrial fibrillation, in NSR today. He is not on anticoagulation due to GI bleeding.  He has not had suitable anatomy for Watchman due to surgical clipping with partial closure of the LA appendage.   Patient has struggled recently with diastolic CHF.  He was admitted in 12/22 with CHF exacerbation.  Echo in 12/22 showed EF 55-60%, mild LVH, normal RV, moderate LAE. With diastolic CHF, PAF, biceps tendon rupture, and bilateral carpal tunnel syndrome, cardiac amyloidosis was considered.  PYP scan was not suggestive of TTR cardiac amyloidosis. 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 He has been seen by hematology, bone marrow biopsy in 3/23 showed plasma cell neoplasm with >10% clonal plasma cells, biopsy stained negative for amyloidosis.    In 5/24, he had inguinal hernia repair.   In 10/24, he was admitted to a hospital in [place] with suspected TIA.  BP was markedly high. CT head was negative, CTA head/neck showed no carotid stenosis.  He was unable to get an MRI due to his pacemaker. Echo showed EF 50-55%, moderate LVH, mild MR, mild-moderate TR, moderate RV dysfunction on my review, biatrial enlargement, IVC not dilated.   Patient returns for followup of CHF.  Weight is up 8 lbs.  He is a little uncertain about his medications.  Zetia is on his med list but he is not taking. He is short of breath walking 100-150 feet. He has  prominent bendopnea.  No orthopnea/PND.  No chest pain. No palpitations, he is in NSR today.   ECG (personally reviewed): A-V sequential pacing  Medtronic device interrogation: 77% a-paced, v-sensed; 22% a-paced, v-paced; no recent AF.   Labs (1/23): K 4, creatinine 2.37, myeloma panel with IgG monoclonal protein with kappa light chain specificity, urine immunofixation with IgG monoclonal protein. Labs (2/23): K 4.8, creatinine 2.9 => 2.55, LFTs normal, TSH normal, BNP 102 Labs (3/23): hgb 11.1, plts 91 Labs (10/23): hgb 13.7, K 4.1, creatinine 1.81 Labs (10/24): LDL 94, K 4.3, creatinine 1.61  PMH: 1. Atrial fibrillation: Paroxysmal 2. Complete heart block: MDT PPM placed in his 50s.  3. Biceps tendon rupture 4. 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 HTN 7. Hyperlipidemia: h/o statin myopathy, muscle pain with Zetia, PCSK9 has been too expensive.  8. CKD stage 4 9. H/o GI bleeding: Not suitable Watchman candidate due to partial closure of the appendage by surgical clip.  He is not anticoagulated.  10. Chronic diastolic CHF: Echo (12/22) with EF 55-60%, mild LVH, normal RV, moderate LAE.  - PYP scan (1/23): grade 1, H/CL 0.8 - Echo (10/24): EF 50-55%, moderate LVH, mild MR, mild-moderate TR, moderate RV dysfunction on my review, biatrial enlargement, IVC not dilated.  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 Bone marrow biopsy 3/23 with plasma cell neoplasm with >10% clonal plasma cells, stain for amyloidosis was negative.  Smoldering myeloma.  12. CAD: CABG x 2 in 1990,  redo CABG x 2 in 2004.  - LHC (11/20): occluded proximal LAD, 99% dLCx, occluded proximal RCA, LIMA-LAD/diagonal patent, SVG-PDA patent. Distal LCx was small, managed medically.  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  14. TIA (10/24): CT head and CTA neck unremarkable.   SH: Retired Occupational hygienist, also was a Firefighter.  Lives in [place].  Nonsmoker.  Rare ETOH.   Family History   Problem Relation Age of Onset   Other Father        MVA   Cancer Mother    ROS: All systems reviewed and negative except as per HPI.   Current Outpatient Medications  Medication Sig Dispense Refill   amiodarone (PACERONE) 200 MG tablet Take 200 mg by mouth daily.     apixaban (ELIQUIS) 2.5 MG TABS tablet Take 1 tablet (2.5 mg total) by mouth 2 (two) times daily. 60 tablet 11   carvedilol (COREG) 25 MG tablet Take 50 mg by mouth 2 (two) times daily with a meal.     Cyanocobalamin (VITAMIN B-12 PO) Take 1 tablet by mouth daily.     DULoxetine (CYMBALTA) 60 MG capsule Take 60 mg by mouth daily.     empagliflozin (JARDIANCE) 10 MG TABS tablet Take 1 tablet (10 mg total) by mouth daily. 90 tablet 3   Evolocumab (REPATHA SURECLICK) 140 MG/ML SOAJ Inject 140 mg into the skin every 14 (fourteen) days. 2 mL 2   glipiZIDE (GLUCOTROL) 10 MG tablet Take 10 mg by mouth 2 (two) times daily.     ibuprofen (ADVIL) 200 MG tablet Take 400 mg by mouth 2 (two) times daily as needed for headache or moderate pain.     insulin lispro (HUMALOG) 100 UNIT/ML injection Inject 0-10 Units into the skin 2 (two) times daily as needed for high blood sugar. Per sliding scale.     Multiple Vitamin (MULTIVITAMIN WITH MINERALS) TABS tablet Take 1 tablet by mouth daily.      rosuvastatin (CRESTOR) 40 MG tablet Take 1 tablet (40 mg total) by mouth daily. 90 tablet 3   Semaglutide,0.25 or 0.5MG /DOS, (OZEMPIC, 0.25 OR 0.5 MG/DOSE,) 2 MG/1.5ML SOPN Inject 0.25 mg into the skin once a week.     spironolactone (ALDACTONE) 25 MG tablet Take 0.5 tablets (12.5 mg total) by mouth daily. 45 tablet 3   triamcinolone cream (KENALOG) 0.1 % Apply 1 Application topically 2 (two) times daily as needed (rough skin patches).     torsemide (DEMADEX) 20 MG tablet Take 0.5 tablets (10 mg total) by mouth daily. 45 tablet 3   No current facility-administered medications for this encounter.   BP 138/70   Pulse 62   Wt 99.1 kg (218 lb 6.4 oz)    SpO2 98%   BMI 31.34 kg/m  General: NAD Neck: JVP 8-9 cm with HJR, no thyromegaly or thyroid nodule.  Lungs: Clear to auscultation bilaterally with normal respiratory effort. CV: Nondisplaced PMI.  Heart regular S1/S2, no S3/S4, no murmur.  1+ ankle edema.  No carotid bruit.  Normal pedal pulses.  Abdomen: Soft, nontender, no hepatosplenomegaly, no distention.  Skin: Intact without lesions or rashes.  Neurologic: Alert and oriented x 3.  Psych: Normal affect. Extremities: No clubbing or cyanosis.  HEENT: Normal.   Assessment/Plan: 1. Chronic diastolic CHF: Echo 12/22 showed EF 55-60%, mild LVH, normal RV, moderate LAE.  There was concern for cardiac amyloidosis with bilateral carpal tunnel syndrome and biceps tendon rupture.  PYP scan was not suggestive of TTR cardiac amyloidosis, but myeloma panel and urine immunofixation were  suggestive of monoclonal gammopathy. Bone marrow biopsy showed plasma cell neoplasm with >10% clonal plasma cells, biopsy staining was negative for amyloidosis.  Given lack of rapid progression of cardiac disease, suspect he does not have cardiac AL amyloidosis.  He is unable to get cardiac MRI, PPM is not compatible.  Echo in 8/24 showed EF 50-55%, moderate LVH, mild MR, mild-moderate TR, moderate RV dysfunction on my review, biatrial enlargement, IVC not dilated.  NYHA class III symptoms.  He looks at least mildly volume overloaded on exam.  - Increase torsemide to 10 mg daily.  BMET/BNP today, BMET in 10 days.  - Continue Jardiance 10 mg daily.   - Add spironolactone 12.5 daily.  2. Monoclonal gammopathy: Bone marrow biopsy in 3/23 with plasma cell neoplasm with >10% clonal plasma cells, staining was negative for amyloidosis.  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  3. CAD: S/p CABG and redo.  Cath in 11/20 showed patent grafts.  He denies chest pain.  - Stop Plavix as I will start Eliquis as below.  - He recently started Repatha. He thinks he is also on Crestor but not sure.   Check lipids today.  4. HTN: BP borderline elevated.   - Starting spironolactone as above.  5. Atrial fibrillation: Paroxysmal.  He is in NSR today.  He is not on anticoagulation due to history of GI bleeding though he has had none recently.  - Continue amiodarone 200 daily. Check LFTs and TSH. He will need a regular eye exam.  - He is not a Watchman candidate, he has a partially closed LA appendage from surgical clip.  - With recent TIA, I am going to have him stop Plavix and start Eliquis 2.5 mg bid (lower dose with age 30, creatinine > 1.5). If he has recurrent GI bleeding, may have to stop again.   6. H/o complete heart block: He has a MDT PPM.  7. CKD: Stage 4.  Last creatinine was 1.72.   - BMET today.  - He sees nephrology.   Followup in 3 wks with APP.  I asked him to bring all his meds with him so we can be sure of what he is taking.   Marca Ancona 12/18/2022

## 2022-12-18 NOTE — Patient Instructions (Signed)
START Eliquis 2.5 mg Twice daily  START Spironolactone 12.5 mg ( 1/2 Tab) daily.  INCREASE Torsemide to 10 mg daily.  STOP Plavix.  Labs done today, your results will be available in MyChart, we will contact you for abnormal readings.  Repeat blood work in 10 days at [facility].   Your physician recommends that you schedule a follow-up appointment in: 3 weeks. PLEASE BRING ALL YOUR MEDICATIONS WITH YOU.  If you have any questions or concerns before your next appointment please send [place] a message through [place] or call our office at [phone].    TO LEAVE A MESSAGE FOR THE NURSE SELECT OPTION 2, PLEASE LEAVE A MESSAGE INCLUDING: YOUR NAME DATE OF BIRTH CALL BACK NUMBER REASON FOR CALL**this is important as we prioritize the call backs  YOU WILL RECEIVE A CALL BACK THE SAME DAY AS LONG AS YOU CALL BEFORE 4:00 PM  At the [facility], you and your health needs are our priority. As part of our continuing mission to provide you with exceptional heart care, we have created designated Provider Care Teams. These Care Teams include your primary Cardiologist (physician) and Advanced Practice Providers (APPs- Physician Assistants and Nurse Practitioners) who all work together to provide you with the care you need, when you need it.   You may see any of the following providers on your designated Care Team at your next follow up: Dr Arvilla Meres Dr Marca Ancona Dr. Dorthula Nettles Dr. Clearnce Hasten Amy Filbert Schilder, NP Robbie Lis, [place] [facility] [place], [place] Brynda Peon, NP [place] Lee, NP Karle Plumber, PharmD   Please be sure to bring in all your medications bottles to every appointment.    Thank you for choosing [facility]

## 2022-12-22 ENCOUNTER — Ambulatory Visit: Payer: Medicare HMO | Attending: Neurological Surgery

## 2022-12-22 DIAGNOSIS — M542 Cervicalgia: Secondary | ICD-10-CM | POA: Insufficient documentation

## 2022-12-28 ENCOUNTER — Other Ambulatory Visit (HOSPITAL_COMMUNITY): Payer: Self-pay

## 2022-12-28 ENCOUNTER — Telehealth (HOSPITAL_COMMUNITY): Payer: Self-pay

## 2022-12-28 ENCOUNTER — Ambulatory Visit (HOSPITAL_COMMUNITY)
Admission: RE | Admit: 2022-12-28 | Discharge: 2022-12-28 | Disposition: A | Discharge status: Home / Self Care | Payer: Medicare HMO | Source: Ambulatory Visit | Attending: Cardiology | Admitting: Cardiology

## 2022-12-28 DIAGNOSIS — I5032 Chronic diastolic (congestive) heart failure: Secondary | ICD-10-CM | POA: Diagnosis present

## 2022-12-28 LAB — BASIC METABOLIC PANEL
Anion gap: 8 (ref 5–15)
BUN: 45 mg/dL — ABNORMAL HIGH (ref 8–23)
CO2: 18 mmol/L — ABNORMAL LOW (ref 22–32)
Calcium: 9.1 mg/dL (ref 8.9–10.3)
Chloride: 106 mmol/L (ref 98–111)
Creatinine, Ser: 2.41 mg/dL — ABNORMAL HIGH (ref 0.61–1.24)
GFR, Estimated: 26 mL/min — ABNORMAL LOW (ref >60)
Glucose, Bld: 177 mg/dL — ABNORMAL HIGH (ref 70–99)
Potassium: 4.2 mmol/L (ref 3.5–5.1)
Sodium: 132 mmol/L — ABNORMAL LOW (ref 135–145)

## 2022-12-28 NOTE — Telephone Encounter (Signed)
Patient had lab appointment,he wanted to discuss medications from last appointment. Not taking Spironolactone due to belching and gas. Patient hasn't picked up Eliquis due to cost.  Spoke to [facility] about Eliquis. We can give him samples to get to January 1. Spoke to Dr. Shirlee Latch about concerns.  Patient needs to be on Eliquis.Attempted phone call to patient ,message left for patient to call office.

## 2022-12-29 ENCOUNTER — Ambulatory Visit: Payer: Medicare HMO

## 2022-12-29 DIAGNOSIS — M542 Cervicalgia: Secondary | ICD-10-CM

## 2022-12-29 MED ORDER — TORSEMIDE 20 MG PO TABS: 10.0000 mg | ORAL_TABLET | ORAL | Status: DC

## 2022-12-29 NOTE — Telephone Encounter (Signed)
Spoke w/pt, gave recommendations from lab work:  Laurey Morale, MD 12/28/2022  2:09 PM EST     Creatinine is up significantly.  Hold torsemide for 2 days then decrease to 10 mg every other day.  BMET in 1 week.   Pt aware, agreeable, and verbalized understanding.  Pt reports GI symptoms r/t a med he is unsure which one, he describes bowel urgency and states he has messed his bed a few times, bloating, and belching, advised these could be related to Ozempic which he is due to take tomorrow, advised to hold dose tomorrow and pharm D team will reach out to him next week to discuss further.  Pt also reports he has not been taking Eliquis and hasn't had it in years, called [facility] and they report having no record of feeling this med, rx that was ready 11/11 was returned to stock as he did not pick it up. Advised pt I would discuss with Dr Shirlee Latch and f/u with him next week.
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
## 2022-12-29 NOTE — Addendum Note (Signed)
Addended by: Noralee Space on: 12/29/2022 04:10 PM   Modules accepted: Orders

## 2023-01-01 ENCOUNTER — Other Ambulatory Visit (HOSPITAL_COMMUNITY): Payer: Self-pay

## 2023-01-01 NOTE — Telephone Encounter (Signed)
Pt admitted to [facility] over weekend for N/V/D, will follow

## 2023-01-02 ENCOUNTER — Ambulatory Visit: Payer: Medicare HMO

## 2023-01-02 DIAGNOSIS — I442 Atrioventricular block, complete: Secondary | ICD-10-CM

## 2023-01-05 LAB — CUP PACEART REMOTE DEVICE CHECK
Battery Impedance: 1617 Ohm
Battery Remaining Longevity: 42 mo
Battery Voltage: 2.75 V
Brady Statistic AP VP Percent: 24 %
Brady Statistic AP VS Percent: 76 %
Brady Statistic AS VP Percent: 0 %
Brady Statistic AS VS Percent: 0 %
Date Time Interrogation Session: 20241129130715
Implantable Lead Connection Status: 753985
Implantable Lead Connection Status: 753985
Implantable Lead Implant Date: 20080208
Implantable Lead Implant Date: 20140130
Implantable Lead Location: 753859
Implantable Lead Location: 753860
Implantable Lead Model: 5076
Implantable Lead Model: 5076
Implantable Pulse Generator Implant Date: 20140130
Lead Channel Impedance Value: 475 Ohm
Lead Channel Impedance Value: 601 Ohm
Lead Channel Pacing Threshold Amplitude: 0.75 V
Lead Channel Pacing Threshold Amplitude: 0.875 V
Lead Channel Pacing Threshold Pulse Width: 0.4 ms
Lead Channel Pacing Threshold Pulse Width: 0.4 ms
Lead Channel Setting Pacing Amplitude: 2 V
Lead Channel Setting Pacing Amplitude: 2.5 V
Lead Channel Setting Pacing Pulse Width: 0.4 ms
Lead Channel Setting Sensing Sensitivity: 2 mV
Zone Setting Status: 755011
Zone Setting Status: 755011

## 2023-01-10 NOTE — Progress Notes (Signed)
PCP: Eartha Inch, MD Cardiology: Dr. Royann Shivers HF Cardiology: Dr. Shirlee Latch  82 y.o. with history of complete heart block s/p pacemaker, CAD s/p CABG and redo, CKD stage 3, GI bleeding, and chronic diastolic CHF was referred to CHF clinic from the [facility] clinic. Patient has a long history of CAD. He had his initial CABG in 1990 then redo in 2004. Last cath in 11/20 showed patent sequential LIMA-LAD/D and SVG-PDA, medical management.  He has a Medtronic PPM due to complete heart block, has had this for years.  He has paroxysmal atrial fibrillation, in NSR today. He is not on anticoagulation due to GI bleeding.  He has not had suitable anatomy for Watchman due to surgical clipping with partial closure of the LA appendage.   Patient has struggled recently with diastolic CHF.  He was admitted in 12/22 with CHF exacerbation.  Echo in 12/22 showed EF 55-60%, mild LVH, normal RV, moderate LAE. With diastolic CHF, PAF, biceps tendon rupture, and bilateral carpal tunnel syndrome, cardiac amyloidosis was considered.  PYP scan was not suggestive of TTR cardiac amyloidosis. 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 He has been seen by hematology, bone marrow biopsy in 3/23 showed plasma cell neoplasm with >10% clonal plasma cells, biopsy stained negative for amyloidosis.    In 5/24, he had inguinal hernia repair.   In 10/24, he was admitted to a hospital in [place] with suspected TIA.  BP was markedly high. CT head was negative, CTA head/neck showed no carotid stenosis.  He was unable to get an MRI due to his pacemaker. Echo showed EF 50-55%, moderate LVH, mild MR, mild-moderate TR, moderate RV dysfunction on my review, biatrial enlargement, IVC not dilated.   Follow up 11/24, NYHA III and mildly volume overloaded. Torsemide increased to 10 mg daily and spiro 12.5 daily added. With recent TIA and partially closed LA appendage from surgical clip, Plavix stopped and  Eliquis 2.5 mg bid started.   Admitted to [facility] 12/31/22 with AKI 2/2 acute gastroenteritis. Received IVF and abx. CT A/P unremarkable. SCr peaked at 2.54. He was discharged home, weight 198 lbs.  Today he returns for HF follow up with his girlfriend. Overall feeling fine. He is not SOB walking on flat ground. Denies palpitations, abnormal bleeding, CP, dizziness, edema, or PND/Orthopnea. Appetite ok. No fever or chills. Weight at home 204 pounds. Taking all medications. Wears CPAP. Confused about medications.  ECG (personally reviewed from yesterday 01/11/23): AV paced  Medtronic device interrogation: 49% a-paced, v-sensed; 51% a-paced, v-paced; no recent AF.   Labs (1/23): K 4, creatinine 2.37, myeloma panel with IgG monoclonal protein with kappa light chain specificity, urine immunofixation with IgG monoclonal protein. Labs (2/23): K 4.8, creatinine 2.9 => 2.55, LFTs normal, TSH normal, BNP 102 Labs (3/23): hgb 11.1, plts 91 Labs (10/23): hgb 13.7, K 4.1, creatinine 1.81 Labs (10/24): LDL 94, K 4.3, creatinine 1.61, LDL 99 Labs (12/24): K 5.3, creatinine 2.30  PMH: 1. Atrial fibrillation: Paroxysmal 2. Complete heart block: MDT PPM placed in his 50s.  3. Biceps tendon rupture 4. 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 HTN 7. Hyperlipidemia: h/o statin myopathy, muscle pain with Zetia, PCSK9 has been too expensive.  8. CKD stage 4 9. H/o GI bleeding: Not suitable Watchman candidate due to partial closure of the appendage by surgical clip.  He is not anticoagulated.  10. Chronic diastolic CHF: Echo (12/22) with EF 55-60%, mild LVH, normal RV, moderate LAE.  -  PYP scan (1/23): grade 1, H/CL 0.8 - Echo (10/24): EF 50-55%, moderate LVH, mild MR, mild-moderate TR, moderate RV dysfunction on my review, biatrial enlargement, IVC not dilated.  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 Bone marrow biopsy 3/23 with plasma cell neoplasm with >10%  clonal plasma cells, stain for amyloidosis was negative.  Smoldering myeloma.  12. CAD: CABG x 2 in 1990, redo CABG x 2 in 2004.  - LHC (11/20): occluded proximal LAD, 99% dLCx, occluded proximal RCA, LIMA-LAD/diagonal patent, SVG-PDA patent. Distal LCx was small, managed medically.  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  14. TIA (10/24): CT head and CTA neck unremarkable.   SH: Retired Occupational hygienist, also was a Firefighter.  Lives in [place].  Nonsmoker.  Rare ETOH.   Family History  Problem Relation Age of Onset   Other Father        MVA   Cancer Mother    ROS: All systems reviewed and negative except as per HPI.   Current Outpatient Medications  Medication Sig Dispense Refill   amiodarone (PACERONE) 200 MG tablet Take 200 mg by mouth daily.     aspirin EC 81 MG tablet Take 81 mg by mouth daily. Swallow whole.     baclofen (LIORESAL) 10 MG tablet Take 10 mg by mouth 3 (three) times daily.     carvedilol (COREG) 25 MG tablet Take 50 mg by mouth 2 (two) times daily with a meal.     clopidogrel (PLAVIX) 75 MG tablet Take 75 mg by mouth daily.     Cyanocobalamin (VITAMIN B-12 PO) Take 1 tablet by mouth daily.     DULoxetine (CYMBALTA) 60 MG capsule Take 60 mg by mouth daily.     empagliflozin (JARDIANCE) 10 MG TABS tablet Take 1 tablet (10 mg total) by mouth daily. 90 tablet 3   ezetimibe (ZETIA) 10 MG tablet Take 10 mg by mouth daily.     ferrous sulfate 325 (65 FE) MG tablet Take 325 mg by mouth daily at 6 (six) AM.     glipiZIDE (GLUCOTROL) 10 MG tablet Take 10 mg by mouth 2 (two) times daily.     ibuprofen (ADVIL) 200 MG tablet Take 400 mg by mouth 2 (two) times daily as needed for headache or moderate pain.     insulin lispro (HUMALOG) 100 UNIT/ML injection Inject 0-10 Units into the skin 2 (two) times daily as needed for high blood sugar. Per sliding scale.     Multiple Vitamin (MULTIVITAMIN WITH MINERALS) TABS tablet Take 1 tablet by mouth daily.      ONETOUCH ULTRA test strip 1 each by Other route as  needed.     rosuvastatin (CRESTOR) 40 MG tablet Take 1 tablet (40 mg total) by mouth daily. 90 tablet 3   spironolactone (ALDACTONE) 25 MG tablet Take 0.5 tablets (12.5 mg total) by mouth daily. 45 tablet 3   torsemide (DEMADEX) 20 MG tablet Take 0.5 tablets (10 mg total) by mouth every other day.     triamcinolone cream (KENALOG) 0.1 % Apply 1 Application topically 2 (two) times daily as needed (rough skin patches).     apixaban (ELIQUIS) 2.5 MG TABS tablet Take 1 tablet (2.5 mg total) by mouth 2 (two) times daily. (Patient not taking: Reported on 01/12/2023) 60 tablet 11   No current facility-administered medications for this encounter.   Wt Readings from Last 3 Encounters:  01/12/23 94.3 kg (208 lb)  01/11/23 91.2 kg (201 lb)  12/18/22 99.1 kg (218 lb 6.4  oz)   BP (!) 144/82   Pulse 74   Wt 94.3 kg (208 lb)   SpO2 99%   BMI 29.84 kg/m  Physical Exam General:  NAD. No resp difficulty, walked into clinic HEENT: Normal Neck: Supple. No JVD. Carotids 2+ bilat; no bruits. No lymphadenopathy or thryomegaly appreciated. Cor: PMI nondisplaced. Regular rate & rhythm. No rubs, gallops or murmurs. Lungs: Clear Abdomen: Soft, obese, nontender, nondistended. No hepatosplenomegaly. No bruits or masses. Good bowel sounds. Extremities: No cyanosis, clubbing, rash, edema Neuro: Alert & oriented x 3, cranial nerves grossly intact. Moves all 4 extremities w/o difficulty. Affect pleasant.  Assessment/Plan: 1. Chronic diastolic CHF: Echo 12/22 showed EF 55-60%, mild LVH, normal RV, moderate LAE.  There was concern for cardiac amyloidosis with bilateral carpal tunnel syndrome and biceps tendon rupture.  PYP scan was not suggestive of TTR cardiac amyloidosis, but myeloma panel and urine immunofixation were suggestive of monoclonal gammopathy. Bone marrow biopsy showed plasma cell neoplasm with >10% clonal plasma cells, biopsy staining was negative for amyloidosis.  Given lack of rapid progression of  cardiac disease, suspect he does not have cardiac AL amyloidosis.  He is unable to get cardiac MRI, PPM is not compatible.  Echo in 8/24 showed EF 50-55%, moderate LVH, mild MR, mild-moderate TR, moderate RV dysfunction on my review, biatrial enlargement, IVC not dilated.  NYHA class II-III symptoms.  He is not volume overloaded on exam. GDMT limited by recent AKI. - Labs yesterday showed K 5.3 and SCr 2.3. Stop spiro. Check BMET in 10-14 days. - Continue torsemide 10 mg every other day. - Continue Jardiance 10 mg daily.   2. Monoclonal gammopathy: Bone marrow biopsy in 3/23 with plasma cell neoplasm with >10% clonal plasma cells, staining was negative for amyloidosis.  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  3. CAD: S/p CABG and redo.  Cath in 11/20 showed patent grafts.  He denies chest pain.  - He is on ASA and Plavix. Unclear when he re-started his ASA. Will stop both today, with addition of Eliquis. - Continue Repatha, Zetia + Crestor.  4. HTN: BP borderline elevated.  - Stopping spironolactone as above.  - Continue to monitor BP, may require addition of amlodipine next. 5. Atrial fibrillation: Paroxysmal. He is not on anticoagulation due to history of GI bleeding though he has had none recently. No recent AF on device interrogation. - Continue amiodarone 200 daily. Check LFTs and TSH at next lab draw. He will need a regular eye exam.  - He is not a Watchman candidate, he has a partially closed LA appendage from surgical clip.  - With recent TIA, we have decided to stop Plavix and start Eliquis 2.5 mg bid (lower dose with age 89, creatinine > 1.5). If he has recurrent GI bleeding, may have to stop again.   - Cost prohibitive, will give samples.  6. H/o complete heart block: He has a MDT PPM.  7. CKD: Stage 4. Recent admit with AKI after GI illness. Labs yesterday showed creatinine was 2.30 and K 5.3. - Stop spironolactone as above. - He sees nephrology.   Check labs in 10-14 days. Follow up in 4  months with Dr. Kathreen Cornfield [facility] FNP-BC 01/12/2023

## 2023-01-10 NOTE — Telephone Encounter (Signed)
Pt was hospitalized at [facility] for acute infectious gastroenteritis w/AKI.  Per chart review with Care Everywhere, Plavix and Cleda Daub were d/c'd  (12/18/22 at OV with Dr Shirlee Latch pt was advised to stop Plavix and start Eliquis 2.5 mg BID however this was never done due to cost)  Spoke w/pt today he states he is now home and doing ok, he is sch for f/u with our office on Fri 12/6 at 2:30 pm, he is aware and states he'll be here, advised him to bring all medication bottles with him, he is agreeable

## 2023-01-11 ENCOUNTER — Ambulatory Visit: Payer: Medicare HMO | Attending: Cardiovascular Disease | Admitting: Cardiovascular Disease

## 2023-01-11 ENCOUNTER — Encounter: Payer: Self-pay | Admitting: Cardiovascular Disease

## 2023-01-11 ENCOUNTER — Telehealth: Payer: Self-pay | Admitting: Emergency Medicine

## 2023-01-11 VITALS — BP 130/62 | HR 67 | Ht 70.0 in | Wt 201.0 lb

## 2023-01-11 DIAGNOSIS — I5032 Chronic diastolic (congestive) heart failure: Secondary | ICD-10-CM

## 2023-01-11 DIAGNOSIS — I4819 Other persistent atrial fibrillation: Secondary | ICD-10-CM

## 2023-01-11 DIAGNOSIS — I25718 Atherosclerosis of autologous vein coronary artery bypass graft(s) with other forms of angina pectoris: Secondary | ICD-10-CM | POA: Diagnosis not present

## 2023-01-11 DIAGNOSIS — I10 Essential (primary) hypertension: Secondary | ICD-10-CM

## 2023-01-11 DIAGNOSIS — Z794 Long term (current) use of insulin: Secondary | ICD-10-CM

## 2023-01-11 DIAGNOSIS — I2510 Atherosclerotic heart disease of native coronary artery without angina pectoris: Secondary | ICD-10-CM | POA: Diagnosis not present

## 2023-01-11 DIAGNOSIS — Z5181 Encounter for therapeutic drug level monitoring: Secondary | ICD-10-CM

## 2023-01-11 DIAGNOSIS — E1122 Type 2 diabetes mellitus with diabetic chronic kidney disease: Secondary | ICD-10-CM

## 2023-01-11 DIAGNOSIS — Z950 Presence of cardiac pacemaker: Secondary | ICD-10-CM

## 2023-01-11 DIAGNOSIS — C9000 Multiple myeloma not having achieved remission: Secondary | ICD-10-CM

## 2023-01-11 DIAGNOSIS — I495 Sick sinus syndrome: Secondary | ICD-10-CM

## 2023-01-11 DIAGNOSIS — N184 Chronic kidney disease, stage 4 (severe): Secondary | ICD-10-CM

## 2023-01-11 DIAGNOSIS — Z79899 Other long term (current) drug therapy: Secondary | ICD-10-CM

## 2023-01-11 DIAGNOSIS — I4729 Other ventricular tachycardia: Secondary | ICD-10-CM

## 2023-01-11 DIAGNOSIS — E782 Mixed hyperlipidemia: Secondary | ICD-10-CM

## 2023-01-11 DIAGNOSIS — I442 Atrioventricular block, complete: Secondary | ICD-10-CM

## 2023-01-11 DIAGNOSIS — D6869 Other thrombophilia: Secondary | ICD-10-CM

## 2023-01-11 DIAGNOSIS — E663 Overweight: Secondary | ICD-10-CM

## 2023-01-11 NOTE — Progress Notes (Signed)
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  BUN and creatinine today are slightly higher than his baseline and the BNP is very low.  Difficult to maintain balance between heart failure and worsening kidney function.  Stop spironolactone.  Continue Jardiance. Overweight: He has lost some weight and his BMI is now in overweight range. MM: He has thrombocytopenia, but this dates back to June 2022.  He has mild chronic anemia.  Has scheduled follow-up in the hematology clinic.    Medication Adjustments/Labs and Tests Ordered: Current medicines are reviewed at length with the patient today.  Concerns regarding medicines are outlined above.  Medication changes, Labs and Tests ordered today are listed in the Patient Instructions below. Patient Instructions  Medication Instructions:  Please Restart Repatha *If you need a refill on your cardiac medications before your next appointment, please call your pharmacy*   Lab Work: BMP, BNP If you have labs  (blood work) drawn today and your tests are completely normal, you will receive your results only by: MyChart Message (if you have MyChart) OR A paper copy in the mail If you have any lab test that is abnormal or we need to change your treatment, we will call you to review the results.   Follow-Up: At [facility], you and your health needs are our priority.  As part of our continuing mission to provide you with exceptional heart care, we have created designated Provider Care Teams.  These Care Teams include your primary Cardiologist (physician) and Advanced Practice Providers (APPs -  Physician Assistants and Nurse Practitioners) who all work together to provide you with the care you need, when you need it.  We recommend signing up for the patient portal called "MyChart".  Sign up information is provided on this After Visit Summary.  MyChart is used to connect with patients for Virtual Visits (Telemedicine).  Patients are able to view lab/test results, encounter notes, upcoming appointments, etc.  Non-urgent messages can be sent to your provider as well.   To learn more about what you can do with MyChart, go to [email or web address].    Your next appointment:   6 month(s)  Provider:   Thurmon Fair, MD        Signed, Thurmon Fair, MD  01/13/2023 12:50 PM    [facility] [address] [place], [place], [place]  [address] Phone: [phone]; Fax: [phone]

## 2023-01-11 NOTE — Telephone Encounter (Signed)
Faxed Pacemaker Interrogation Report as requested by patient

## 2023-01-11 NOTE — Patient Instructions (Signed)
Medication Instructions:  Please Restart Repatha *If you need a refill on your cardiac medications before your next appointment, please call your pharmacy*   Lab Work: BMP, BNP If you have labs (blood work) drawn today and your tests are completely normal, you will receive your results only by: MyChart Message (if you have MyChart) OR A paper copy in the mail If you have any lab test that is abnormal or we need to change your treatment, we will call you to review the results.   Follow-Up: At [facility], you and your health needs are our priority.  As part of our continuing mission to provide you with exceptional heart care, we have created designated Provider Care Teams.  These Care Teams include your primary Cardiologist (physician) and Advanced Practice Providers (APPs -  Physician Assistants and Nurse Practitioners) who all work together to provide you with the care you need, when you need it.  We recommend signing up for the patient portal called "MyChart".  Sign up information is provided on this After Visit Summary.  MyChart is used to connect with patients for Virtual Visits (Telemedicine).  Patients are able to view lab/test results, encounter notes, upcoming appointments, etc.  Non-urgent messages can be sent to your provider as well.   To learn more about what you can do with MyChart, go to [email or web address].    Your next appointment:   6 month(s)  Provider:   Thurmon Fair, MD

## 2023-01-12 ENCOUNTER — Encounter (HOSPITAL_COMMUNITY): Payer: Self-pay

## 2023-01-12 ENCOUNTER — Ambulatory Visit (HOSPITAL_COMMUNITY)
Admission: RE | Admit: 2023-01-12 | Discharge: 2023-01-12 | Disposition: A | Discharge status: Home / Self Care | Payer: Medicare HMO | Source: Ambulatory Visit | Attending: Family Medicine | Admitting: Family Medicine

## 2023-01-12 VITALS — BP 144/82 | HR 74 | Wt 208.0 lb

## 2023-01-12 DIAGNOSIS — I25718 Atherosclerosis of autologous vein coronary artery bypass graft(s) with other forms of angina pectoris: Secondary | ICD-10-CM

## 2023-01-12 DIAGNOSIS — I480 Paroxysmal atrial fibrillation: Secondary | ICD-10-CM | POA: Diagnosis not present

## 2023-01-12 DIAGNOSIS — I442 Atrioventricular block, complete: Secondary | ICD-10-CM

## 2023-01-12 DIAGNOSIS — Z951 Presence of aortocoronary bypass graft: Secondary | ICD-10-CM | POA: Diagnosis not present

## 2023-01-12 DIAGNOSIS — Z8679 Personal history of other diseases of the circulatory system: Secondary | ICD-10-CM | POA: Diagnosis not present

## 2023-01-12 DIAGNOSIS — C9000 Multiple myeloma not having achieved remission: Secondary | ICD-10-CM | POA: Diagnosis not present

## 2023-01-12 DIAGNOSIS — Z7984 Long term (current) use of oral hypoglycemic drugs: Secondary | ICD-10-CM | POA: Diagnosis not present

## 2023-01-12 DIAGNOSIS — N184 Chronic kidney disease, stage 4 (severe): Secondary | ICD-10-CM | POA: Diagnosis not present

## 2023-01-12 DIAGNOSIS — I2510 Atherosclerotic heart disease of native coronary artery without angina pectoris: Secondary | ICD-10-CM | POA: Diagnosis not present

## 2023-01-12 DIAGNOSIS — Z7901 Long term (current) use of anticoagulants: Secondary | ICD-10-CM | POA: Insufficient documentation

## 2023-01-12 DIAGNOSIS — I10 Essential (primary) hypertension: Secondary | ICD-10-CM | POA: Diagnosis not present

## 2023-01-12 DIAGNOSIS — Z8673 Personal history of transient ischemic attack (TIA), and cerebral infarction without residual deficits: Secondary | ICD-10-CM | POA: Insufficient documentation

## 2023-01-12 DIAGNOSIS — I130 Hypertensive heart and chronic kidney disease with heart failure and stage 1 through stage 4 chronic kidney disease, or unspecified chronic kidney disease: Secondary | ICD-10-CM | POA: Diagnosis not present

## 2023-01-12 DIAGNOSIS — D472 Monoclonal gammopathy: Secondary | ICD-10-CM

## 2023-01-12 DIAGNOSIS — I5032 Chronic diastolic (congestive) heart failure: Secondary | ICD-10-CM | POA: Insufficient documentation

## 2023-01-12 DIAGNOSIS — Z79899 Other long term (current) drug therapy: Secondary | ICD-10-CM | POA: Diagnosis not present

## 2023-01-12 LAB — BASIC METABOLIC PANEL
BUN/Creatinine Ratio: 26 — ABNORMAL HIGH (ref 10–24)
BUN: 59 mg/dL — ABNORMAL HIGH (ref 8–27)
CO2: 21 mmol/L (ref 20–29)
Calcium: 9.3 mg/dL (ref 8.6–10.2)
Chloride: 102 mmol/L (ref 96–106)
Creatinine, Ser: 2.3 mg/dL — ABNORMAL HIGH (ref 0.76–1.27)
Glucose: 207 mg/dL — ABNORMAL HIGH (ref 70–99)
Potassium: 5.3 mmol/L — ABNORMAL HIGH (ref 3.5–5.2)
Sodium: 134 mmol/L (ref 134–144)
eGFR: 28 mL/min/{1.73_m2} — ABNORMAL LOW (ref >59)

## 2023-01-12 LAB — BRAIN NATRIURETIC PEPTIDE: BNP: 76.6 pg/mL (ref 0.0–100.0)

## 2023-01-12 MED ORDER — CARVEDILOL 25 MG PO TABS: 50.0000 mg | ORAL_TABLET | Freq: Two times a day (BID) | ORAL | 6 refills | Status: DC

## 2023-01-12 NOTE — Progress Notes (Signed)
Pt was given 1 month of samples 4 boxes of Eliquis 2.5 mg, pt educated on administration and route. Pt verbalized understanding of information given.

## 2023-01-12 NOTE — Patient Instructions (Addendum)
Thank you for coming in today  If you had labs drawn today, any labs that are abnormal the clinic will call you No news is good news  Medications: STOP Plavix STOP Spironolactone STOP Asprin   Follow up appointments: Your physician recommends that you return for lab work in: 10-14 days TSH, BNP,CMET  Your physician recommends that you schedule a follow-up appointment in:  4 months With Dr. Earlean Shawl will receive a reminder letter in the mail a few months in advance. If you don't receive a letter, please call our office to schedule the follow-up appointment.    Do the following things EVERYDAY: Weigh yourself in the morning before breakfast. Write it down and keep it in a log. Take your medicines as prescribed Eat low salt foods--Limit salt (sodium) to 2000 mg per day.  Stay as active as you can everyday Limit all fluids for the day to less than 2 liters   At [facility], you and your health needs are our priority. As part of our continuing mission to provide you with exceptional heart care, we have created designated Provider Care Teams. These Care Teams include your primary Cardiologist (physician) and Advanced Practice Providers (APPs- Physician Assistants and Nurse Practitioners) who all work together to provide you with the care you need, when you need it.   You may see any of the following providers on your designated Care Team at your next follow up: Dr Arvilla Meres Dr Marca Ancona Dr. Marcos Eke, NP Robbie Lis, [place] [facility] [place], [place] Brynda Peon, NP Karle Plumber, PharmD   Please be sure to bring in all your medications bottles to every appointment.    Thank you for choosing [facility]  If you have any questions or concerns before your next appointment please send [place] a message through [place] or call our office at [phone].    TO LEAVE A MESSAGE FOR THE  NURSE SELECT OPTION 2, PLEASE LEAVE A MESSAGE INCLUDING: YOUR NAME DATE OF BIRTH CALL BACK NUMBER REASON FOR CALL**this is important as we prioritize the call backs  YOU WILL RECEIVE A CALL BACK THE SAME DAY AS LONG AS YOU CALL BEFORE 4:00 PM

## 2023-01-13 ENCOUNTER — Encounter: Payer: Self-pay | Admitting: Cardiovascular Disease

## 2023-01-22 ENCOUNTER — Encounter: Payer: Self-pay | Admitting: Internal Medicine

## 2023-01-22 ENCOUNTER — Ambulatory Visit (HOSPITAL_COMMUNITY)
Admission: RE | Admit: 2023-01-22 | Discharge: 2023-01-22 | Disposition: A | Discharge status: Home / Self Care | Payer: Medicare HMO | Source: Ambulatory Visit | Attending: Cardiology | Admitting: Cardiology

## 2023-01-22 DIAGNOSIS — I5032 Chronic diastolic (congestive) heart failure: Secondary | ICD-10-CM | POA: Insufficient documentation

## 2023-01-22 LAB — BASIC METABOLIC PANEL
Anion gap: 9 (ref 5–15)
BUN: 33 mg/dL — ABNORMAL HIGH (ref 8–23)
CO2: 22 mmol/L (ref 22–32)
Calcium: 9.3 mg/dL (ref 8.9–10.3)
Chloride: 105 mmol/L (ref 98–111)
Creatinine, Ser: 2.17 mg/dL — ABNORMAL HIGH (ref 0.61–1.24)
GFR, Estimated: 30 mL/min — ABNORMAL LOW (ref >60)
Glucose, Bld: 132 mg/dL — ABNORMAL HIGH (ref 70–99)
Potassium: 4.7 mmol/L (ref 3.5–5.1)
Sodium: 136 mmol/L (ref 135–145)

## 2023-02-01 NOTE — Addendum Note (Signed)
Addended by: Geralyn Flash D on: 02/01/2023 01:44 PM   Modules accepted: Orders

## 2023-02-01 NOTE — Progress Notes (Signed)
Remote pacemaker transmission.   

## 2023-02-09 ENCOUNTER — Telehealth: Payer: Self-pay | Admitting: Pharmacist

## 2023-02-09 NOTE — Telephone Encounter (Signed)
 Call pt to remind for f/u lipid lab. Pt reports he went to hospital and his Repatha  was stopped. Has not restarted yet. Will restart from this week. Rosuvastatin  was causing myalgia so pt will continue with Zetia  10 mg and Repatha  140 mg every 14 days. Follow up lab due on March 20,2024

## 2023-02-16 NOTE — Telephone Encounter (Signed)
 Patient forgot the name of the medications that he suppose to stop when he start taking Repatha.   Called back and clarify that he suppose to d/c rosuvastatin as it was causing myalgia and continue taking Zetia and Repatha.

## 2023-02-19 ENCOUNTER — Other Ambulatory Visit (HOSPITAL_COMMUNITY): Payer: Self-pay

## 2023-03-03 ENCOUNTER — Other Ambulatory Visit: Payer: Self-pay | Admitting: Cardiovascular Disease

## 2023-03-14 ENCOUNTER — Ambulatory Visit (HOSPITAL_COMMUNITY): Payer: Medicare HMO | Admitting: Cardiology

## 2023-03-15 ENCOUNTER — Telehealth: Payer: Self-pay | Admitting: Internal Medicine

## 2023-03-15 NOTE — Telephone Encounter (Signed)
 Patient would like to get [phone]

## 2023-04-02 ENCOUNTER — Inpatient Hospital Stay: Payer: Medicare HMO | Attending: Internal Medicine

## 2023-04-02 ENCOUNTER — Inpatient Hospital Stay (HOSPITAL_BASED_OUTPATIENT_CLINIC_OR_DEPARTMENT_OTHER): Payer: Medicare HMO | Admitting: Hematology

## 2023-04-02 ENCOUNTER — Other Ambulatory Visit: Payer: Self-pay

## 2023-04-02 VITALS — BP 147/63 | HR 82 | Temp 97.9°F | Resp 17 | Wt 221.1 lb

## 2023-04-02 DIAGNOSIS — D472 Monoclonal gammopathy: Secondary | ICD-10-CM

## 2023-04-02 DIAGNOSIS — I130 Hypertensive heart and chronic kidney disease with heart failure and stage 1 through stage 4 chronic kidney disease, or unspecified chronic kidney disease: Secondary | ICD-10-CM | POA: Diagnosis not present

## 2023-04-02 DIAGNOSIS — I2510 Atherosclerotic heart disease of native coronary artery without angina pectoris: Secondary | ICD-10-CM | POA: Insufficient documentation

## 2023-04-02 DIAGNOSIS — D631 Anemia in chronic kidney disease: Secondary | ICD-10-CM | POA: Insufficient documentation

## 2023-04-02 DIAGNOSIS — E1122 Type 2 diabetes mellitus with diabetic chronic kidney disease: Secondary | ICD-10-CM | POA: Diagnosis not present

## 2023-04-02 DIAGNOSIS — M25512 Pain in left shoulder: Secondary | ICD-10-CM | POA: Insufficient documentation

## 2023-04-02 DIAGNOSIS — N183 Chronic kidney disease, stage 3 unspecified: Secondary | ICD-10-CM | POA: Diagnosis not present

## 2023-04-02 DIAGNOSIS — M545 Low back pain, unspecified: Secondary | ICD-10-CM | POA: Insufficient documentation

## 2023-04-02 DIAGNOSIS — I255 Ischemic cardiomyopathy: Secondary | ICD-10-CM | POA: Diagnosis not present

## 2023-04-02 LAB — CBC WITH DIFFERENTIAL (CANCER CENTER ONLY)
Abs Immature Granulocytes: 0.01 10*3/uL (ref 0.00–0.07)
Basophils Absolute: 0 10*3/uL (ref 0.0–0.1)
Basophils Relative: 1 %
Eosinophils Absolute: 0.2 10*3/uL (ref 0.0–0.5)
Eosinophils Relative: 3 %
HCT: 33.5 % — ABNORMAL LOW (ref 39.0–52.0)
Hemoglobin: 10.7 g/dL — ABNORMAL LOW (ref 13.0–17.0)
Immature Granulocytes: 0 %
Lymphocytes Relative: 22 %
Lymphs Abs: 1.3 10*3/uL (ref 0.7–4.0)
MCH: 30.9 pg (ref 26.0–34.0)
MCHC: 31.9 g/dL (ref 30.0–36.0)
MCV: 96.8 fL (ref 80.0–100.0)
Monocytes Absolute: 0.6 10*3/uL (ref 0.1–1.0)
Monocytes Relative: 10 %
Neutro Abs: 3.7 10*3/uL (ref 1.7–7.7)
Neutrophils Relative %: 64 %
Platelet Count: 80 10*3/uL — ABNORMAL LOW (ref 150–400)
RBC: 3.46 MIL/uL — ABNORMAL LOW (ref 4.22–5.81)
RDW: 14.4 % (ref 11.5–15.5)
WBC Count: 5.8 10*3/uL (ref 4.0–10.5)
nRBC: 0 % (ref 0.0–0.2)

## 2023-04-02 LAB — CMP (CANCER CENTER ONLY)
ALT: 28 U/L (ref 0–44)
AST: 24 U/L (ref 15–41)
Albumin: 3.7 g/dL (ref 3.5–5.0)
Alkaline Phosphatase: 74 U/L (ref 38–126)
Anion gap: 6 (ref 5–15)
BUN: 37 mg/dL — ABNORMAL HIGH (ref 8–23)
CO2: 24 mmol/L (ref 22–32)
Calcium: 9.1 mg/dL (ref 8.9–10.3)
Chloride: 107 mmol/L (ref 98–111)
Creatinine: 2.01 mg/dL — ABNORMAL HIGH (ref 0.61–1.24)
GFR, Estimated: 33 mL/min — ABNORMAL LOW (ref >60)
Glucose, Bld: 177 mg/dL — ABNORMAL HIGH (ref 70–99)
Potassium: 4.2 mmol/L (ref 3.5–5.1)
Sodium: 137 mmol/L (ref 135–145)
Total Bilirubin: 0.4 mg/dL (ref 0.0–1.2)
Total Protein: 8.2 g/dL — ABNORMAL HIGH (ref 6.5–8.1)

## 2023-04-02 LAB — LACTATE DEHYDROGENASE: LDH: 142 U/L (ref 98–192)

## 2023-04-02 LAB — SEDIMENTATION RATE: Sed Rate: 63 mm/h — ABNORMAL HIGH (ref 0–16)

## 2023-04-02 NOTE — Progress Notes (Signed)
 HEMATOLOGY/ONCOLOGY CLINIC NOTE  Date of Service: 04/02/2023  Patient Care Team: Eartha Inch, MD as PCP - General (Family Medicine) [name], Rachelle Hora, MD as PCP - Cardiology (Cardiology) [name], Rachelle Hora, MD as Consulting Physician (Cardiology) Janalyn Harder, MD (Inactive) as Consulting Physician (Dermatology) Glyn Ade, PA-C as Physician Assistant (Dermatology)  CHIEF COMPLAINTS/PURPOSE OF CONSULTATION:  Follow-up for evaluation and management of plasma cell dyscrasia and monoclonal paraproteinemia  HISTORY OF PRESENTING ILLNESS:  See previous note for details of initial presentation.  INTERVAL HISTORY:  Alan [name] is a 83 y.o. 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   Patient was last seen by me on 09/29/2022 and he complained of mild posterior neck pain and occasional left shoulder discomfort wen lifting and lowering left arm.   Patient notes he has been doing well overall since our last visit. He denies any new infection issues, fever, chills, night sweats, unexpected weight loss, back pain, chest pain, abdominal pain, or leg swelling.   He does complain of lower back pain and left shoulder pain. Patient notes that his lower back pain has been limiting him from walking. He notes that these are not new symptoms.   Patient has discontinued Crestor 40 mg.   MEDICAL HISTORY:  Past Medical History:  Diagnosis Date   Anticoagulant long-term use    Arthritis    Arthropathy of left shoulder    Atypical mole    Biceps tendonitis, left    Bursitis of left shoulder    Cardiac pacemaker in situ    first insertion 2008;  genertor and new lead change 03-08-2012;   [employer]   CHB (complete heart block) (HCC)    s/p  PPM 2008   Chronic back pain    CKD (chronic kidney disease), stage III [facility])    Coronary artery disease cardiologist--- dr croitoru   hx  CABG x2  1990 and re-do 2004;  multiple PCI to RCA 1990 to 2004;     cardiac cath 2012  occluded LAD and RCA with patent grafts   Difficult intubation    " Dr. Royann Shivers said that it was difficulty to get the tube in."   History of coronary angioplasty    multiple PCI to RCA ,  1990 to 2004   History of GI bleed followed by GI-- Everette Rank PA @ [facility] in [place]   01/ 2020  upper and lower GI bleed ;  s/p  EGD with cautery of jejunal bleed and blood transfusion's   History of kidney stones    Hx of echocardiogram 06/29/2008   EF 45-50%    Hypertension    Ischemic cardiomyopathy    previously reported , ef 40-45%;  2010--- ef 45-50%;   last echo 2017 55-60%   Mixed hyperlipidemia    Nocturia    OSA on CPAP    cpap, 12, last sleep study Jan2013, sees Dr. Fannie Knee   PAF (paroxysmal atrial fibrillation) (HCC)    long hx PAF--- followed by dr croitoru   Post-polio syndrome    dx polio age 26;   09-30-2018  per pt occasional gait issues and occasion fall   Presence of permanent cardiac pacemaker    Pseudoarthrosis of lumbar spine    Rotator cuff tear, left    S/P CABG x 2    1990 x2  and 2004  x2   Sinus node dysfunction (HCC)    2015--- sinus node arrest  with intact  AV conduction   Squamous cell carcinoma of skin 06/14/2021   Mid Parietal Scalp (in situ) (tx p bx)   Type 2 diabetes mellitus (HCC)    followed by pcp    SURGICAL HISTORY: Past Surgical History:  Procedure Laterality Date   APPENDECTOMY  child   APPLICATION OF INTRAOPERATIVE CT SCAN N/A 07/20/2020   Procedure: APPLICATION OF INTRAOPERATIVE CT SCAN;  Surgeon: [name], Alan Mulder, DO;  Location: [facility] OR;  Service: Neurosurgery;  Laterality: N/A;   BLEPHAROPLASTY Bilateral 12/2014   upper eyelid   CARDIAC PACEMAKER PLACEMENT  2008   CARDIOVERSION N/A 01/15/2019   Procedure: CARDIOVERSION;  Surgeon: Thurmon Fair, MD;  Location: [facility] ENDOSCOPY;  Service: Cardiovascular;  Laterality: N/A;   CARPAL TUNNEL RELEASE Bilateral 2015;  2016   CATARACT EXTRACTION W/ INTRAOCULAR LENS   IMPLANT, BILATERAL  2017   CORONARY ANGIOPLASTY  1990  to 2004   multiple to RCA   CORONARY ARTERY BYPASS GRAFT  1990    @ [place] (dr chitwood)   seq. LIMA to LAD and Diagonal   CORONARY ARTERY BYPASS GRAFT  2004     dr Janey Genta   SVG to  RCA and PDA;  ALSO MAZE  PROCEDURE   ESOPHAGOGASTRODUODENOSCOPY Left 03/18/2013   Procedure: ESOPHAGOGASTRODUODENOSCOPY (EGD);  Surgeon: Florencia Reasons, MD;  Location: [facility] ENDOSCOPY;  Service: Endoscopy;  Laterality: Left;   HARDWARE REMOVAL N/A 05/18/2016   Procedure: Removal of broken hardware Lumbar three-four;  Surgeon: Tia Alert, MD;  Location: [facility] OR;  Service: Neurosurgery;  Laterality: N/A;   HERNIA REPAIR     LEFT HEART CATH AND CORS/GRAFTS ANGIOGRAPHY N/A 12/18/2018   Procedure: LEFT HEART CATH AND CORS/GRAFTS ANGIOGRAPHY;  Surgeon: Iran Ouch, MD;  [facility]: [facility] INVASIVE CV LAB;  Service: Cardiovascular;  Laterality: N/A;   LEFT HEART CATHETERIZATION WITH CORONARY/GRAFT ANGIOGRAM N/A 12/21/2010   Procedure: LEFT HEART CATHETERIZATION WITH Isabel Caprice;  Surgeon: Marykay Lex, MD;  Location: [facility] CATH LAB;  Service: Cardiovascular;  Laterality: N/A;   LEG SURGERY Right 2002  approx.   lengthened his leg   LUMBAR LAMINECTOMY/DECOMPRESSION MICRODISCECTOMY  01/10/2012   Procedure: LUMBAR LAMINECTOMY/DECOMPRESSION MICRODISCECTOMY 1 LEVEL;  Surgeon: Tia Alert, MD;  Location: [facility] NEURO ORS;  Service: Neurosurgery;  Laterality: Bilateral;  Lumbar three-four decompression, Posterior lateral fusion lumbar three-four, posterior spinus plate lumbar three-four   LUMBAR LAMINECTOMY/DECOMPRESSION MICRODISCECTOMY Left 05/18/2016   Procedure: Laminectomy and Foraminotomy - Lumbar two-lumbar three- Lumbar four-lumbar five- left with removal hardware lumbar three-four;  Surgeon: Tia Alert, MD;  Location: [facility] OR;  Service: Neurosurgery;  Laterality: Left;   NASAL SEPTUM SURGERY  yrs ago   PACEMAKER REVISION N/A 03/07/2012   Procedure:  PACEMAKER REVISION;  Surgeon: Thurmon Fair, MD;  [facility]: [facility] CATH LAB;  Service: Cardiovascular;  Laterality: N/A;   POSTERIOR LUMBAR FUSION  02-22-2017   dr Yetta Barre  @ [facility]   re-do laminectomy L2-3 and fusion   POSTERIOR LUMBAR FUSION  03-11-2018   dr Yetta Barre @ [facility]   laminectomy L4-5;  fixation L2-S1 and fusion L3-S1   SACROILIAC JOINT FUSION N/A 07/20/2020   Procedure: Sacroiliac Joint Fusion;  Surgeon: Bethann Goo, DO;  [facility]: [facility] OR;  Service: Neurosurgery;  Laterality: N/A;   SHOULDER ARTHROSCOPY Right after 2016, pt unsure year   SHOULDER ARTHROSCOPY WITH OPEN ROTATOR CUFF REPAIR Right 03/30/2014   Procedure: RIGHT SHOULDER ARTHROSCOPY  OPEN ROTATOR CUFF REPAIR;  Surgeon: Nestor Lewandowsky, MD;  Location: [facility] OR;  Service:  Orthopedics;  Laterality: Right;   SHOULDER ARTHROSCOPY WITH SUBACROMIAL DECOMPRESSION, ROTATOR CUFF REPAIR AND BICEP TENDON REPAIR Left 10/01/2018   Procedure: LEFT SHOULDER ARTHROSCOPY, DISTAL CLAVICLE EXCISION,SUBACROMIAL, DECOMPRESSION, RORATOR CUFF REPAIR, BICEPS TENODESIS;  Surgeon: Sheral Apley, MD;  Location: [facility];  Service: Orthopedics;  Laterality: Left;   SHOULDER OPEN ROTATOR CUFF REPAIR Right 03/30/2014   Procedure: ROTATOR CUFF REPAIR SHOULDER OPEN;  Surgeon: Nestor Lewandowsky, MD;  Location: [facility] OR;  Service: Orthopedics;  Laterality: Right;   TEE WITHOUT CARDIOVERSION N/A 01/15/2019   Procedure: TRANSESOPHAGEAL ECHOCARDIOGRAM (TEE);  Surgeon: Thurmon Fair, MD;  Location: [facility] ENDOSCOPY;  Service: Cardiovascular;  Laterality: N/A;   TONSILLECTOMY  child   TOTAL KNEE ARTHROPLASTY Left 2006    SOCIAL HISTORY: Social History   Socioeconomic History   Marital status: Single    Spouse name: Not on file   Number of children: 1   Years of education: 16   Highest education level: Associate degree: academic program  Occupational History   Occupation: pilot-retired  Tobacco Use   Smoking status: Never   Smokeless tobacco: Never  Vaping Use    Vaping status: Never Used  Substance and Sexual Activity   Alcohol use: Not Currently   Drug use: Never   Sexual activity: Yes  Other Topics Concern   Not on file  Social History Narrative   [place], [place].    Right-handed.   1 cup caffeine per day.   Social Drivers of Corporate investment banker Strain: Low Risk  (02/21/2023)   Received from [facility]   Overall Financial Resource Strain (CARDIA)    Difficulty of Paying Living Expenses: Not hard at all  Food Insecurity: No Food Insecurity (02/21/2023)   Received from [facility]   Hunger Vital Sign    Worried About Running Out of Food in the Last Year: Never true    Ran Out of Food in the Last Year: Never true  Transportation Needs: No Transportation Needs (02/21/2023)   Received from [facility] - Transportation    Lack of Transportation (Medical): No    Lack of Transportation (Non-Medical): No  Physical Activity: Insufficiently Active (03/22/2022)   Received from [facility]   Exercise Vital Sign    Days of Exercise per Week: 2 days    Minutes of Exercise per Session: 20 min  Stress: No Stress Concern Present (01/01/2023)   Received from [facility] - Occupational Stress Questionnaire    Feeling of Stress : Not at all  Social Connections: Socially Integrated (03/22/2022)   [name], [facility]   Social Network    How would you rate your social network (family, work, friends)?: Good participation with social networks  Intimate Partner Violence: Not At Risk (12/31/2022)   Received from [facility]   HITS    Over the last 12 months how often did your partner physically hurt you?: Never    Over the last 12 months how often did your partner insult you or talk down to you?: Never    Over the last 12 months how often did your partner threaten you with physical harm?: Never    Over the last 12  months how often did your partner scream or curse at you?: Never  Retired Occupational hygienist.  Also worked as a Firefighter.  Lives in [place]. Non-smoker.  Rare alcohol use.  FAMILY HISTORY: Family History  Problem Relation Age of Onset   Other Father        MVA   Cancer Mother     ALLERGIES:  has no known allergies.  MEDICATIONS:  Current Outpatient Medications  Medication Sig Dispense Refill   amiodarone (PACERONE) 200 MG tablet Take 200 mg by mouth daily.     apixaban (ELIQUIS) 2.5 MG TABS tablet Take 1 tablet (2.5 mg total) by mouth 2 (two) times daily. (Patient not taking: Reported on 01/12/2023) 60 tablet 11   baclofen (LIORESAL) 10 MG tablet Take 10 mg by mouth 3 (three) times daily.     carvedilol (COREG) 25 MG tablet Take 2 tablets (50 mg total) by mouth 2 (two) times daily with a meal. 120 tablet 6   Cyanocobalamin (VITAMIN B-12 PO) Take 1 tablet by mouth daily.     DULoxetine (CYMBALTA) 60 MG capsule Take 60 mg by mouth daily.     empagliflozin (JARDIANCE) 10 MG TABS tablet Take 1 tablet (10 mg total) by mouth daily. 90 tablet 3   Evolocumab (REPATHA SURECLICK) 140 MG/ML SOAJ INJECT 140 MG INTO THE SKIN EVERY 14 (FOURTEEN) DAYS. 6 mL 3   ezetimibe (ZETIA) 10 MG tablet Take 10 mg by mouth daily.     ferrous sulfate 325 (65 FE) MG tablet Take 325 mg by mouth daily at 6 (six) AM.     glipiZIDE (GLUCOTROL) 10 MG tablet Take 10 mg by mouth 2 (two) times daily.     ibuprofen (ADVIL) 200 MG tablet Take 400 mg by mouth 2 (two) times daily as needed for headache or moderate pain.     insulin lispro (HUMALOG) 100 UNIT/ML injection Inject 0-10 Units into the skin 2 (two) times daily as needed for high blood sugar. Per sliding scale.     Multiple Vitamin (MULTIVITAMIN WITH MINERALS) TABS tablet Take 1 tablet by mouth daily.      ONETOUCH ULTRA test strip 1 each by Other route as needed.     rosuvastatin (CRESTOR) 40 MG tablet Take 1 tablet (40 mg total) by mouth daily. 90 tablet 3   torsemide  (DEMADEX) 20 MG tablet Take 0.5 tablets (10 mg total) by mouth every other day.     triamcinolone cream (KENALOG) 0.1 % Apply 1 Application topically 2 (two) times daily as needed (rough skin patches).     No current facility-administered medications for this visit.    REVIEW OF SYSTEMS:   10 Point review of Systems was done is negative except as noted above.  PHYSICAL EXAMINATION: ECOG PERFORMANCE STATUS: 2 - Symptomatic, <50% confined to bed  . Vitals:   04/02/23 1046  BP: (!) 147/63  Pulse: 82  Resp: 17  Temp: 97.9 F (36.6 C)  SpO2: 100%   Filed Weights   04/02/23 1046  Weight: 221 lb 1.6 oz (100.3 kg)   .Body mass index is 31.72 kg/m. NAD GENERAL:alert, in no acute distress and comfortable SKIN: no acute rashes, no significant lesions EYES: conjunctiva are pink and non-injected, sclera anicteric NECK: supple, no JVD LYMPH:  no palpable lymphadenopathy in the cervical, axillary or inguinal regions LUNGS: clear to auscultation b/l with normal respiratory effort HEART: regular rate & rhythm ABDOMEN:  normoactive bowel sounds , not distended. Right pectoral tenderness. Extremity: no pedal edema PSYCH: alert & oriented x 3 with fluent speech NEURO: no focal motor/sensory deficits  LABORATORY DATA:  I have reviewed the data as listed  .    Latest Ref  Rng & Units 04/02/2023   10:32 AM 12/18/2022   12:54 PM 09/15/2022    1:24 PM  CBC  WBC 4.0 - 10.5 K/uL 5.8  5.1  4.5   Hemoglobin 13.0 - 17.0 g/dL 14.7  82.9  9.7   Hematocrit 39.0 - 52.0 % 33.5  32.1  29.9   Platelets 150 - 400 K/uL 80  55  163     .    Latest Ref Rng & Units 04/02/2023   10:32 AM 01/22/2023    3:07 PM 01/11/2023    9:18 AM  CMP  Glucose 70 - 99 mg/dL 562  130  865   BUN 8 - 23 mg/dL 37  33  59   Creatinine 0.61 - 1.24 mg/dL 7.84  6.96  2.95   Sodium 135 - 145 mmol/L 137  136  134   Potassium 3.5 - 5.1 mmol/L 4.2  4.7  5.3   Chloride 98 - 111 mmol/L 107  105  102   CO2 22 - 32 mmol/L 24   22  21    Calcium 8.9 - 10.3 mg/dL 9.1  9.3  9.3   Total Protein 6.5 - 8.1 g/dL 8.2     Total Bilirubin 0.0 - 1.2 mg/dL 0.4     Alkaline Phos 38 - 126 U/L 74     AST 15 - 41 U/L 24     ALT 0 - 44 U/L 28        RADIOGRAPHIC STUDIES: I have personally reviewed the radiological images as listed and agreed with the findings in the report. No results found.  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 and less likely to the monoclonal paraproteinemia.  Also patient has ischemic cardiomyopathy which is likely explanation for his congestive heart failure along with decreasing diuretics and seems to be less likely related to AL amyloidosis clinically.  PLAN: -Discussed lab results from today, 04/02/2023, in detail with the patient. CBC shows anemia with Hgb of 10.7 g/dL with Hct of 28.4% and low platelets of 80 K.  CMP stable LDH wnl at 142  Myeloma panel shows stable M spike of 1.8g/dl -Patient has no clinical or lab findings suggestive of active myeloma at this time requiring treatment -will continue to monitor with labs every 6 months -Answered all of patient's questions.   FOLLOW-UP: Labs in 22 weeks RTC with Dr Candise Che in 24 weeks   The total time spent in the appointment was 20 minutes* .  All of the patient's questions were answered with apparent satisfaction. The patient knows to call the clinic with any problems, questions or concerns.   Wyvonnia Lora MD MS AAHIVMS [facility] Hematology/Oncology Physician [facility]  .*Total Encounter Time as defined by the Centers for Medicare and  Medicaid Services includes, in addition to the face-to-face time of a patient visit (documented in the note above) non-face-to-face time: obtaining and reviewing outside history, ordering and reviewing medications, tests or procedures, care coordination (communications with other health care professionals or caregivers) and documentation in the medical record.   I,Param Shah,acting as a Neurosurgeon for Wyvonnia Lora, MD.,have documented all relevant documentation on the behalf of Nida Manfredi  Candise Che, MD,as directed by  Wyvonnia Lora, MD while in the presence of Wyvonnia Lora, MD.  .I have reviewed the above documentation for accuracy and completeness, and I agree with the above. Johney Maine MD

## 2023-04-03 ENCOUNTER — Telehealth: Payer: Self-pay | Admitting: Hematology

## 2023-04-03 ENCOUNTER — Ambulatory Visit (INDEPENDENT_AMBULATORY_CARE_PROVIDER_SITE_OTHER): Payer: Medicare HMO

## 2023-04-03 DIAGNOSIS — I495 Sick sinus syndrome: Secondary | ICD-10-CM

## 2023-04-03 LAB — KAPPA/LAMBDA LIGHT CHAINS
Kappa free light chain: 45.6 mg/L — ABNORMAL HIGH (ref 3.3–19.4)
Kappa, lambda light chain ratio: 1.92 — ABNORMAL HIGH (ref 0.26–1.65)
Lambda free light chains: 23.8 mg/L (ref 5.7–26.3)

## 2023-04-03 NOTE — Telephone Encounter (Signed)
 Spoke with patient confirming upcoming appointment

## 2023-04-04 LAB — CUP PACEART REMOTE DEVICE CHECK
Battery Impedance: 1819 Ohm
Battery Remaining Longevity: 37 mo
Battery Voltage: 2.75 V
Brady Statistic AP VP Percent: 26 %
Brady Statistic AP VS Percent: 73 %
Brady Statistic AS VP Percent: 0 %
Brady Statistic AS VS Percent: 0 %
Date Time Interrogation Session: 20250225115642
Implantable Lead Connection Status: 753985
Implantable Lead Connection Status: 753985
Implantable Lead Implant Date: 20080208
Implantable Lead Implant Date: 20140130
Implantable Lead Location: 753859
Implantable Lead Location: 753860
Implantable Lead Model: 5076
Implantable Lead Model: 5076
Implantable Pulse Generator Implant Date: 20140130
Lead Channel Impedance Value: 463 Ohm
Lead Channel Impedance Value: 565 Ohm
Lead Channel Pacing Threshold Amplitude: 0.75 V
Lead Channel Pacing Threshold Amplitude: 0.75 V
Lead Channel Pacing Threshold Pulse Width: 0.4 ms
Lead Channel Pacing Threshold Pulse Width: 0.4 ms
Lead Channel Setting Pacing Amplitude: 2 V
Lead Channel Setting Pacing Amplitude: 2.5 V
Lead Channel Setting Pacing Pulse Width: 0.4 ms
Lead Channel Setting Sensing Sensitivity: 2.8 mV
Zone Setting Status: 755011
Zone Setting Status: 755011

## 2023-04-05 LAB — MULTIPLE MYELOMA PANEL, SERUM
Albumin SerPl Elph-Mcnc: 3.5 g/dL (ref 2.9–4.4)
Albumin/Glob SerPl: 0.9 (ref 0.7–1.7)
Alpha 1: 0.2 g/dL (ref 0.0–0.4)
Alpha2 Glob SerPl Elph-Mcnc: 0.8 g/dL (ref 0.4–1.0)
B-Globulin SerPl Elph-Mcnc: 0.8 g/dL (ref 0.7–1.3)
Gamma Glob SerPl Elph-Mcnc: 2.4 g/dL — ABNORMAL HIGH (ref 0.4–1.8)
Globulin, Total: 4.2 g/dL — ABNORMAL HIGH (ref 2.2–3.9)
IgA: 114 mg/dL (ref 61–437)
IgG (Immunoglobin G), Serum: 2420 mg/dL — ABNORMAL HIGH (ref 603–1613)
IgM (Immunoglobulin M), Srm: 18 mg/dL (ref 15–143)
M Protein SerPl Elph-Mcnc: 1.8 g/dL — ABNORMAL HIGH
Total Protein ELP: 7.7 g/dL (ref 6.0–8.5)

## 2023-04-10 ENCOUNTER — Encounter: Payer: Self-pay | Admitting: Cardiovascular Disease

## 2023-05-06 NOTE — Progress Notes (Deleted)
 HPI M never smoker followed for OSA, history UPPP. Complicated by Post polio, AFib/ pacemaker, CAD/CABG,  DM 2, HBP, gout,  DDD,      PCP Dr Cyndia Bent NPSG 05/30/97- AHI 20/ hr, desaturation to 78%, body weight 230 lbs Office Spirometry-02/26/2018- Mild Restriction of exhaled volume.  FVC 2.9/69%, FEV1 2.4/81%, ratio 1.84, FEF 25-75% 3.1/145%. -----------------------------------------------------------------------------   11/20/22- 81 YOM never smoker followed for OSA, history UPPP. Complicated by Post polio, AFib/ Pacemaker/ Watchman/Coumadin, CAD/CABG/ CHF,  DM 2, CKD4, HBP, gout, Covid infection 02/11/20, ?[facility], TIA,  CPAP 12/ [employer]-AutoSet AirSense 10   also has an AirSense 11 Download compliance -  Body weight today- ED 10/8- TIA. He says he tipped tractor over and was in a ditch with cracked ribs. Sore now but feels back to baseline. Download reviewed. He now is interested in [place] referral. Discussed.   05/08/23- 81 YOM never smoker followed for OSA, history UPPP. Complicated by Post polio, AFib/ Pacemaker/ Watchman/Coumadin, CAD/CABG/ CHF,  DM 2, CKD4, HBP, gout, Covid infection 02/11/20, ?[facility], TIA,  CPAP 12/ [employer]-AutoSet AirSense 10   also has an AirSense 11 Download compliance -  Body weight today- Recent radiofrequency cervical rhizotomy   ROS-HPI  + = positive Constitutional:   No-   weight loss, night sweats, fevers, chills, fatigue, lassitude. HEENT:   No-  headaches, difficulty swallowing, tooth/dental problems, sore throat,       No-  sneezing, itching, ear ache, nasal congestion, post nasal drip,  CV:  No-   chest pain, orthopnea, PND, swelling in lower extremities, anasarca, dizziness, palpitations Resp: +shortness of breath with exertion or at rest,  cough            No-   No- coughing up of blood.              No-   change in color of mucus.  No- wheezing.   Skin: No-   rash or lesions. GI:  No-   heartburn, indigestion, abdominal pain, nausea, vomiting,  GU:  MS: + joint  pain or swelling.  + back pain Neuro-     nothing unusual Psych:  No- change in mood or affect. No depression or anxiety.  No memory loss.  OBJ General- Alert, Oriented, Affect-appropriate, Distress- none acute, + overweight Skin- no rash Lymphadenopathy- none Head- atraumatic            Eyes- Gross vision intact, PERRLA, conjunctivae clear secretions            Ears- +Hard of hearing            Nose- Clear, no-Septal dev, mucus, polyps, erosion, perforation             Throat- Mallampati III/ +sp UPPP , mucosa clear , drainage- none, tonsils- atrophic Neck- flexible , trachea midline, no stridor , thyroid nl, carotid no bruit Chest - symmetrical excursion , unlabored           Heart/CV- RRR , no murmur , no gallop  , no rub, nl s1 s2                           - JVD- none , edema- none, stasis changes- none, varices- none           Lung- +clear/ unlabored, wheeze- none, cough- none , dullness-none, rub- none           Chest wall-  + pacemaker L,  Abd- Br/ Gen/ Rectal-  Not done, not indicated Extrem-  Neuro- grossly intact to observation

## 2023-05-08 ENCOUNTER — Ambulatory Visit: Payer: Medicare HMO | Admitting: Internal Medicine

## 2023-05-09 NOTE — Addendum Note (Signed)
 Addended by: Elease Etienne A on: 05/09/2023 10:53 AM   Modules accepted: Orders

## 2023-05-09 NOTE — Progress Notes (Signed)
 Remote pacemaker transmission.

## 2023-05-23 ENCOUNTER — Other Ambulatory Visit (HOSPITAL_COMMUNITY): Payer: Self-pay | Admitting: Cardiology

## 2023-06-02 ENCOUNTER — Other Ambulatory Visit: Payer: Self-pay

## 2023-06-02 ENCOUNTER — Observation Stay (HOSPITAL_COMMUNITY)
Admission: EM | Admit: 2023-06-02 | Discharge: 2023-06-03 | Disposition: A | Discharge status: Home / Self Care | Attending: Internal Medicine | Admitting: Internal Medicine

## 2023-06-02 ENCOUNTER — Emergency Department (HOSPITAL_COMMUNITY)

## 2023-06-02 ENCOUNTER — Encounter (HOSPITAL_COMMUNITY): Payer: Self-pay

## 2023-06-02 DIAGNOSIS — I441 Atrioventricular block, second degree: Secondary | ICD-10-CM | POA: Insufficient documentation

## 2023-06-02 DIAGNOSIS — Z950 Presence of cardiac pacemaker: Secondary | ICD-10-CM | POA: Diagnosis not present

## 2023-06-02 DIAGNOSIS — Z7984 Long term (current) use of oral hypoglycemic drugs: Secondary | ICD-10-CM | POA: Diagnosis not present

## 2023-06-02 DIAGNOSIS — I5032 Chronic diastolic (congestive) heart failure: Secondary | ICD-10-CM | POA: Diagnosis not present

## 2023-06-02 DIAGNOSIS — I130 Hypertensive heart and chronic kidney disease with heart failure and stage 1 through stage 4 chronic kidney disease, or unspecified chronic kidney disease: Secondary | ICD-10-CM | POA: Insufficient documentation

## 2023-06-02 DIAGNOSIS — I442 Atrioventricular block, complete: Secondary | ICD-10-CM | POA: Diagnosis not present

## 2023-06-02 DIAGNOSIS — Z7982 Long term (current) use of aspirin: Secondary | ICD-10-CM | POA: Diagnosis not present

## 2023-06-02 DIAGNOSIS — D61818 Other pancytopenia: Secondary | ICD-10-CM | POA: Diagnosis not present

## 2023-06-02 DIAGNOSIS — I480 Paroxysmal atrial fibrillation: Secondary | ICD-10-CM | POA: Diagnosis not present

## 2023-06-02 DIAGNOSIS — I2510 Atherosclerotic heart disease of native coronary artery without angina pectoris: Secondary | ICD-10-CM | POA: Insufficient documentation

## 2023-06-02 DIAGNOSIS — E1122 Type 2 diabetes mellitus with diabetic chronic kidney disease: Secondary | ICD-10-CM | POA: Diagnosis not present

## 2023-06-02 DIAGNOSIS — I495 Sick sinus syndrome: Secondary | ICD-10-CM | POA: Diagnosis not present

## 2023-06-02 DIAGNOSIS — E119 Type 2 diabetes mellitus without complications: Secondary | ICD-10-CM

## 2023-06-02 DIAGNOSIS — N1832 Chronic kidney disease, stage 3b: Secondary | ICD-10-CM | POA: Diagnosis not present

## 2023-06-02 DIAGNOSIS — G4733 Obstructive sleep apnea (adult) (pediatric): Secondary | ICD-10-CM | POA: Diagnosis not present

## 2023-06-02 DIAGNOSIS — R7989 Other specified abnormal findings of blood chemistry: Secondary | ICD-10-CM | POA: Diagnosis not present

## 2023-06-02 DIAGNOSIS — Z85828 Personal history of other malignant neoplasm of skin: Secondary | ICD-10-CM | POA: Insufficient documentation

## 2023-06-02 DIAGNOSIS — Z8579 Personal history of other malignant neoplasms of lymphoid, hematopoietic and related tissues: Secondary | ICD-10-CM | POA: Insufficient documentation

## 2023-06-02 DIAGNOSIS — R072 Precordial pain: Secondary | ICD-10-CM | POA: Diagnosis present

## 2023-06-02 DIAGNOSIS — Z951 Presence of aortocoronary bypass graft: Secondary | ICD-10-CM | POA: Diagnosis not present

## 2023-06-02 DIAGNOSIS — R079 Chest pain, unspecified: Secondary | ICD-10-CM | POA: Diagnosis not present

## 2023-06-02 DIAGNOSIS — I25118 Atherosclerotic heart disease of native coronary artery with other forms of angina pectoris: Secondary | ICD-10-CM | POA: Diagnosis not present

## 2023-06-02 DIAGNOSIS — E785 Hyperlipidemia, unspecified: Secondary | ICD-10-CM | POA: Diagnosis present

## 2023-06-02 DIAGNOSIS — N183 Chronic kidney disease, stage 3 unspecified: Secondary | ICD-10-CM | POA: Diagnosis present

## 2023-06-02 DIAGNOSIS — Z7902 Long term (current) use of antithrombotics/antiplatelets: Secondary | ICD-10-CM | POA: Insufficient documentation

## 2023-06-02 DIAGNOSIS — I5030 Unspecified diastolic (congestive) heart failure: Secondary | ICD-10-CM | POA: Diagnosis present

## 2023-06-02 DIAGNOSIS — Z7901 Long term (current) use of anticoagulants: Secondary | ICD-10-CM | POA: Diagnosis not present

## 2023-06-02 DIAGNOSIS — Z794 Long term (current) use of insulin: Secondary | ICD-10-CM

## 2023-06-02 DIAGNOSIS — I5020 Unspecified systolic (congestive) heart failure: Secondary | ICD-10-CM | POA: Diagnosis not present

## 2023-06-02 DIAGNOSIS — Z96652 Presence of left artificial knee joint: Secondary | ICD-10-CM | POA: Insufficient documentation

## 2023-06-02 DIAGNOSIS — R002 Palpitations: Secondary | ICD-10-CM

## 2023-06-02 DIAGNOSIS — I10 Essential (primary) hypertension: Secondary | ICD-10-CM | POA: Diagnosis present

## 2023-06-02 DIAGNOSIS — Z79899 Other long term (current) drug therapy: Secondary | ICD-10-CM | POA: Diagnosis not present

## 2023-06-02 LAB — BASIC METABOLIC PANEL WITH GFR
Anion gap: 7 (ref 5–15)
BUN: 36 mg/dL — ABNORMAL HIGH (ref 8–23)
CO2: 24 mmol/L (ref 22–32)
Calcium: 9 mg/dL (ref 8.9–10.3)
Chloride: 105 mmol/L (ref 98–111)
Creatinine, Ser: 1.83 mg/dL — ABNORMAL HIGH (ref 0.61–1.24)
GFR, Estimated: 37 mL/min — ABNORMAL LOW (ref >60)
Glucose, Bld: 166 mg/dL — ABNORMAL HIGH (ref 70–99)
Potassium: 4.5 mmol/L (ref 3.5–5.1)
Sodium: 136 mmol/L (ref 135–145)

## 2023-06-02 LAB — HEPATIC FUNCTION PANEL
ALT: 26 U/L (ref 0–44)
AST: 33 U/L (ref 15–41)
Albumin: 3.4 g/dL — ABNORMAL LOW (ref 3.5–5.0)
Alkaline Phosphatase: 60 U/L (ref 38–126)
Bilirubin, Direct: 0.2 mg/dL (ref 0.0–0.2)
Indirect Bilirubin: 0.5 mg/dL (ref 0.3–0.9)
Total Bilirubin: 0.7 mg/dL (ref 0.0–1.2)
Total Protein: 8.3 g/dL — ABNORMAL HIGH (ref 6.5–8.1)

## 2023-06-02 LAB — CBC
HCT: 36.9 % — ABNORMAL LOW (ref 39.0–52.0)
Hemoglobin: 12.2 g/dL — ABNORMAL LOW (ref 13.0–17.0)
MCH: 32.4 pg (ref 26.0–34.0)
MCHC: 33.1 g/dL (ref 30.0–36.0)
MCV: 98.1 fL (ref 80.0–100.0)
Platelets: 69 10*3/uL — ABNORMAL LOW (ref 150–400)
RBC: 3.76 MIL/uL — ABNORMAL LOW (ref 4.22–5.81)
RDW: 13.1 % (ref 11.5–15.5)
WBC: 5.7 10*3/uL (ref 4.0–10.5)
nRBC: 0 % (ref 0.0–0.2)

## 2023-06-02 LAB — TSH: TSH: 1.107 u[IU]/mL (ref 0.350–4.500)

## 2023-06-02 LAB — LIPASE, BLOOD: Lipase: 50 U/L (ref 11–51)

## 2023-06-02 LAB — BRAIN NATRIURETIC PEPTIDE: B Natriuretic Peptide: 166.6 pg/mL — ABNORMAL HIGH (ref 0.0–100.0)

## 2023-06-02 LAB — GLUCOSE, CAPILLARY: Glucose-Capillary: 149 mg/dL — ABNORMAL HIGH (ref 70–99)

## 2023-06-02 LAB — TROPONIN I (HIGH SENSITIVITY)
Troponin I (High Sensitivity): 28 ng/L — ABNORMAL HIGH (ref <18)
Troponin I (High Sensitivity): 21 ng/L — ABNORMAL HIGH (ref <18)

## 2023-06-02 LAB — MRSA NEXT GEN BY PCR, NASAL: MRSA by PCR Next Gen: NOT DETECTED

## 2023-06-02 MED ORDER — ACETAMINOPHEN 325 MG PO TABS: 650.0000 mg | ORAL_TABLET | Freq: Four times a day (QID) | ORAL | Status: DC | PRN

## 2023-06-02 MED ORDER — CLOPIDOGREL BISULFATE 75 MG PO TABS
75.0000 mg | ORAL_TABLET | Freq: Every day | ORAL | Status: DC
  Administered 2023-06-02 – 2023-06-03 (×2): 75 mg via ORAL
  Filled 2023-06-02 (×2): qty 1

## 2023-06-02 MED ORDER — APIXABAN 2.5 MG PO TABS
2.5000 mg | ORAL_TABLET | Freq: Two times a day (BID) | ORAL | Status: DC
  Administered 2023-06-02 – 2023-06-03 (×2): 2.5 mg via ORAL
  Filled 2023-06-02 (×2): qty 1

## 2023-06-02 MED ORDER — CARVEDILOL 25 MG PO TABS
50.0000 mg | ORAL_TABLET | Freq: Two times a day (BID) | ORAL | Status: DC
  Administered 2023-06-02 – 2023-06-03 (×3): 50 mg via ORAL
  Filled 2023-06-02: qty 4
  Filled 2023-06-02 (×2): qty 2

## 2023-06-02 MED ORDER — ACETAMINOPHEN 650 MG RE SUPP: 650.0000 mg | Freq: Four times a day (QID) | RECTAL | Status: DC | PRN

## 2023-06-02 MED ORDER — ALBUTEROL SULFATE (2.5 MG/3ML) 0.083% IN NEBU: 2.5000 mg | INHALATION_SOLUTION | Freq: Four times a day (QID) | RESPIRATORY_TRACT | Status: DC | PRN

## 2023-06-02 MED ORDER — ENOXAPARIN SODIUM 40 MG/0.4ML IJ SOSY
40.0000 mg | PREFILLED_SYRINGE | INTRAMUSCULAR | Status: DC
  Administered 2023-06-02: 40 mg via SUBCUTANEOUS
  Filled 2023-06-02: qty 0.4

## 2023-06-02 MED ORDER — ASPIRIN 81 MG PO CHEW
324.0000 mg | CHEWABLE_TABLET | Freq: Once | ORAL | Status: AC
  Administered 2023-06-02: 324 mg via ORAL
  Filled 2023-06-02: qty 4

## 2023-06-02 MED ORDER — EZETIMIBE 10 MG PO TABS
10.0000 mg | ORAL_TABLET | Freq: Every day | ORAL | Status: DC
  Administered 2023-06-02 – 2023-06-03 (×2): 10 mg via ORAL
  Filled 2023-06-02 (×2): qty 1

## 2023-06-02 MED ORDER — SODIUM CHLORIDE 0.9% FLUSH
3.0000 mL | Freq: Two times a day (BID) | INTRAVENOUS | Status: DC
  Administered 2023-06-02 (×2): 3 mL via INTRAVENOUS

## 2023-06-02 MED ORDER — ASPIRIN 81 MG PO TBEC: 81.0000 mg | DELAYED_RELEASE_TABLET | Freq: Every day | ORAL | Status: DC

## 2023-06-02 MED ORDER — EMPAGLIFLOZIN 10 MG PO TABS
10.0000 mg | ORAL_TABLET | Freq: Every day | ORAL | Status: DC
  Administered 2023-06-02 – 2023-06-03 (×2): 10 mg via ORAL
  Filled 2023-06-02 (×2): qty 1

## 2023-06-02 MED ORDER — AMIODARONE HCL 200 MG PO TABS
200.0000 mg | ORAL_TABLET | Freq: Every day | ORAL | Status: DC
Start: 2023-06-02 — End: 2023-06-03
  Administered 2023-06-02 – 2023-06-03 (×2): 200 mg via ORAL
  Filled 2023-06-02 (×2): qty 1

## 2023-06-02 NOTE — ED Notes (Signed)
 Pt ambulated to restroom with no difficulties or abnormalities.

## 2023-06-02 NOTE — H&P (Addendum)
 History and Physical    Patient: Alan [name] NFA:[id number] DOB: [birth year] DOA: 06/02/2023 DOS: the patient was seen and examined on 06/02/2023 PCP: [name], MD  Patient coming from: Home  Chief Complaint:  Chief Complaint  Patient presents with   Chest Pain   HPI: Alan [name] is a 83 y.o. male with medical history significant of hypertension,  CAD s/p CABG and redo, chronic HFpEF, CHB with pacemaker, diabetes mellitus type 2, smoldering myeloma, atrial fibrillation , and CKD 3B presents with complaints of chest pain.  The chest pain comes and goes in waves, starting around 10 PM last night. The pain is located on the left side of his chest, around the heart, and does not radiate to the neck or arm. He describes the pain as a sharp 'spike type feeling' that occurs occasionally. No nausea, vomiting, or sweating accompany the pain.  He has a history of shortness of breath and reduced stamina, noting that he can walk about 300 feet downhill to his mailbox but struggles on the return uphill, often needing to take a break. No recent leg swelling or calf pain. His significant other notes that he has experienced leg swelling in the past, which has since been managed with diuretics.  He has a long history of cardiac issues, having undergone multiple catheterizations in the past.  Stating that normally whenever he has a heart issue he requires a catheterization  In the emergency department patient was noted to be afebrile with pulse 59-64, blood pressures 143/63 to 185/94, all other vital signs maintained.  Labs significant for high-sensitivity troponin 28->21.  Other labs appear near baseline include hemoglobin 12.2, platelets 69, BUN 36, creatinine 1.83, and glucose 166.  EKG revealed a paced rhythm.  Chest x-ray noted stable cardiomegaly without acute abnormality.  TRH called to admit.  Review of Systems: As mentioned in the history of present illness. All other systems reviewed and  are negative. Past Medical History:  Diagnosis Date   Anticoagulant long-term use     Arthritis     Arthropathy of left shoulder     Atypical mole     Biceps tendonitis, left     Bursitis of left shoulder     Cardiac pacemaker in situ      first insertion 2008;  genertor and new lead change 03-08-2012;   Medtronic   CHB (complete heart block) (HCC)      s/p  PPM 2008   Chronic back pain     CKD (chronic kidney disease), stage III [facility])     Coronary artery disease cardiologist--- dr croitoru    hx  CABG x2  1990 and re-do 2004;  multiple PCI to RCA 1990 to 2004;    cardiac cath 2012  occluded LAD and RCA with patent grafts   Difficult intubation      " Dr. Alvis Ba said that it was difficulty to get the tube in."   History of coronary angioplasty      multiple PCI to RCA ,  1990 to 2004   History of GI bleed followed by GI-- Annamary Kida PA @ [facility]    01/ 2020  upper and lower GI bleed ;  s/p  EGD with cautery of jejunal bleed and blood transfusion's   History of kidney stones     Hx of echocardiogram 06/29/2008    EF 45-50%    Hypertension     Ischemic cardiomyopathy      previously  reported , ef 40-45%;  2010--- ef 45-50%;   last echo 2017 55-60%   Mixed hyperlipidemia     Nocturia     OSA on CPAP      cpap, 12, last sleep study Jan2013, sees Dr. Dianne Fortune   PAF (paroxysmal atrial fibrillation) (HCC)      long hx PAF--- followed by dr croitoru   Post-polio syndrome      dx polio age 74;   09-30-2018  per pt occasional gait issues and occasion fall   Presence of permanent cardiac pacemaker     Pseudoarthrosis of lumbar spine     Rotator cuff tear, left     S/P CABG x 2      1990 x2  and 2004  x2   Sinus node dysfunction (HCC)      2015--- sinus node arrest  with intact AV conduction   Squamous cell carcinoma of skin 06/14/2021    Mid Parietal Scalp (in situ) (tx p bx)   Type 2 diabetes mellitus (HCC)      followed by pcp                Past Surgical History:  Procedure Laterality Date   APPENDECTOMY   child   APPLICATION OF INTRAOPERATIVE CT SCAN N/A 07/20/2020    Procedure: APPLICATION OF INTRAOPERATIVE CT SCAN;  Surgeon: [name], Colby Daub, DO;  Location: [facility] OR;  Service: Neurosurgery;  Laterality: N/A;   BLEPHAROPLASTY Bilateral 12/2014    upper eyelid   CARDIAC PACEMAKER PLACEMENT   2008   CARDIOVERSION N/A 01/15/2019    Procedure: CARDIOVERSION;  Surgeon: Luana Rumple, MD;  Location: [facility] ENDOSCOPY;  Service: Cardiovascular;  Laterality: N/A;   CARPAL TUNNEL RELEASE Bilateral 2015;  2016   CATARACT EXTRACTION W/ INTRAOCULAR LENS  IMPLANT, BILATERAL   2017   CORONARY ANGIOPLASTY   1990  to 2004    multiple to RCA   CORONARY ARTERY BYPASS GRAFT   1990    @ [place] (dr chitwood)    seq. LIMA to LAD and Diagonal   CORONARY ARTERY BYPASS GRAFT   2004     dr Argie Beets    SVG to  RCA and PDA;  ALSO MAZE  PROCEDURE   ESOPHAGOGASTRODUODENOSCOPY Left 03/18/2013    Procedure: ESOPHAGOGASTRODUODENOSCOPY (EGD);  Surgeon: Brice Campi, MD;  Location: [facility] ENDOSCOPY;  Service: Endoscopy;  Laterality: Left;   HARDWARE REMOVAL N/A 05/18/2016    Procedure: Removal of broken hardware Lumbar three-four;  Surgeon: Isadora Mar, MD;  Location: [facility] OR;  Service: Neurosurgery;  Laterality: N/A;   HERNIA REPAIR       LEFT HEART CATH AND CORS/GRAFTS ANGIOGRAPHY N/A 12/18/2018    Procedure: LEFT HEART CATH AND CORS/GRAFTS ANGIOGRAPHY;  Surgeon: Wenona Hamilton, MD;  Location: [facility] INVASIVE CV LAB;  Service: Cardiovascular;  Laterality: N/A;   LEFT HEART CATHETERIZATION WITH CORONARY/GRAFT ANGIOGRAM N/A 12/21/2010    Procedure: LEFT HEART CATHETERIZATION WITH Estella Helling;  Surgeon: Arleen Lacer, MD;  Location: [facility] CATH LAB;  Service: Cardiovascular;  Laterality: N/A;   LEG SURGERY Right 2002  approx.    lengthened his leg   LUMBAR LAMINECTOMY/DECOMPRESSION MICRODISCECTOMY   01/10/2012    Procedure: LUMBAR  LAMINECTOMY/DECOMPRESSION MICRODISCECTOMY 1 LEVEL;  Surgeon: Isadora Mar, MD;  Location: [facility] NEURO ORS;  Service: Neurosurgery;  Laterality: Bilateral;  Lumbar three-four decompression, Posterior lateral fusion lumbar three-four, posterior spinus plate lumbar three-four   LUMBAR LAMINECTOMY/DECOMPRESSION MICRODISCECTOMY Left 05/18/2016    Procedure:  Laminectomy and Foraminotomy - Lumbar two-lumbar three- Lumbar four-lumbar five- left with removal hardware lumbar three-four;  Surgeon: Isadora Mar, MD;  Location: [facility] OR;  Service: Neurosurgery;  Laterality: Left;   NASAL SEPTUM SURGERY   yrs ago   PACEMAKER REVISION N/A 03/07/2012    Procedure: PACEMAKER REVISION;  Surgeon: Luana Rumple, MD;  Location: [facility] CATH LAB;  Service: Cardiovascular;  Laterality: N/A;   POSTERIOR LUMBAR FUSION   02-22-2017   dr Rochelle Chu  @ [facility]    re-do laminectomy L2-3 and fusion   POSTERIOR LUMBAR FUSION   03-11-2018   dr Rochelle Chu @ [facility]    laminectomy L4-5;  fixation L2-S1 and fusion L3-S1   SACROILIAC JOINT FUSION N/A 07/20/2020    Procedure: Sacroiliac Joint Fusion;  Surgeon: Pincus Bridgeman, DO;  Location: [facility] OR;  Service: Neurosurgery;  Laterality: N/A;   SHOULDER ARTHROSCOPY Right after 2016, pt unsure year   SHOULDER ARTHROSCOPY WITH OPEN ROTATOR CUFF REPAIR Right 03/30/2014    Procedure: RIGHT SHOULDER ARTHROSCOPY  OPEN ROTATOR CUFF REPAIR;  Surgeon: Ilean Mall, MD;  Location: [facility] OR;  Service: Orthopedics;  Laterality: Right;   SHOULDER ARTHROSCOPY WITH SUBACROMIAL DECOMPRESSION, ROTATOR CUFF REPAIR AND BICEP TENDON REPAIR Left 10/01/2018    Procedure: LEFT SHOULDER ARTHROSCOPY, DISTAL CLAVICLE EXCISION,SUBACROMIAL, DECOMPRESSION, RORATOR CUFF REPAIR, BICEPS TENODESIS;  Surgeon: Saundra Curl, MD;  Location: [facility];  Service: Orthopedics;  Laterality: Left;   SHOULDER OPEN ROTATOR CUFF REPAIR Right 03/30/2014    Procedure: ROTATOR CUFF REPAIR SHOULDER OPEN;  Surgeon: Ilean Mall, MD;  Location: [facility] OR;   Service: Orthopedics;  Laterality: Right;   TEE WITHOUT CARDIOVERSION N/A 01/15/2019    Procedure: TRANSESOPHAGEAL ECHOCARDIOGRAM (TEE);  Surgeon: Luana Rumple, MD;  Location: [facility] ENDOSCOPY;  Service: Cardiovascular;  Laterality: N/A;   TONSILLECTOMY   child   TOTAL KNEE ARTHROPLASTY Left 2006          Social History:   reports that he has never smoked. He has never used smokeless tobacco. He reports that he does not currently use alcohol. He reports that he does not use drugs.   Allergies  No Known Allergies          Family History  Problem Relation Age of Onset   Other Father          MVA   Cancer Mother         Prior to Admission medications   Medication Sig Start Date End Date Taking? Authorizing Provider  amiodarone  (PACERONE ) 200 MG tablet Take 200 mg by mouth daily. 03/30/23  Yes [provider]  apixaban  (ELIQUIS ) 5 MG TABS tablet Take 5 mg by mouth daily.   Yes [provider]  carvedilol  (COREG ) 25 MG tablet Take 50 mg by mouth 2 (two) times daily. 04/03/23  Yes [provider]  ezetimibe  (ZETIA ) 10 MG tablet Take 10 mg by mouth daily. 05/12/23  Yes [provider]  glipiZIDE  (GLUCOTROL ) 10 MG tablet Take 10 mg by mouth 2 (two) times daily. 05/08/23  Yes [provider]  insulin  lispro (HUMALOG ) 100 UNIT/ML injection Inject into the skin 2 (two) times daily after a meal. Per sliding scale 05/10/23  Yes [provider]  JARDIANCE  10 MG TABS tablet Take 10 mg by mouth daily. 02/13/23  Yes [provider]  torsemide  (DEMADEX ) 20 MG tablet Take 10 mg by mouth daily. 05/12/23  Yes [provider]  ASPIRIN  LOW DOSE 81 MG tablet Take 81 mg  by mouth daily. Patient not taking: Reported on 06/02/2023 05/14/23   [provider]  baclofen  (LIORESAL ) 10 MG tablet Take 10 mg by mouth daily. Patient not taking: Reported on 06/02/2023 05/12/23   [provider]  clopidogrel  (PLAVIX ) 75 MG tablet Take 75 mg by  mouth daily. Patient not taking: Reported on 06/02/2023 05/11/23   [provider]  DULoxetine (CYMBALTA) 60 MG capsule Take 60 mg by mouth daily. Patient not taking: Reported on 06/02/2023 05/08/23   [provider]  spironolactone  (ALDACTONE ) 25 MG tablet Take 12.5 mg by mouth daily. Patient not taking: Reported on 06/02/2023 05/12/23   [provider]    Physical Exam: Vitals:   06/02/23 0339 06/02/23 0438 06/02/23 0500 06/02/23 0615  BP: (!) 185/94  (!) 143/63 (!) 149/70  Pulse: 64  (!) 59 60  Resp: 18  18 15   Temp: 98.1 F (36.7 C)  98.4 F (36.9 C)   SpO2: 100%  100% 100%  Weight:  93 kg    Height:  5' 9.5" (1.765 m)      Constitutional: Elderly male currently in no acute distress Eyes: PERRL, lids and conjunctivae normal ENMT: Mucous membranes are moist.  Neck: normal, supple, no masses, no JVD present Respiratory: clear to auscultation bilaterally, no wheezing, no crackles. Normal respiratory effort. No accessory muscle use.  Cardiovascular: Regular rate and rhythm,  No extremity edema.  Abdomen: no tenderness, no masses palpated.  Bowel sounds positive.  Musculoskeletal: no clubbing / cyanosis. No joint deformity upper and lower extremities. Good ROM, no contractures. Normal muscle tone.  Skin: no rashes, lesions, ulcers. No induration Neurologic: CN 2-12 grossly intact. Strength 5/5 in all 4.  Psychiatric: Normal judgment and insight. Alert and oriented x 3. Normal mood.   Data Reviewed:  EKG reveals AV paced rhythm at 61 bpm.  Reviewed labs, imaging, and pertinent records as documented.   Assessment and Plan:  Chest pain Elevated troponin CAD s/p CABG Patient reports having intermittent left-sided chest pain that he describes as sharp.  No associated increase in shortness of breath, nausea, vomiting, or diaphoresis.  High-sensitivity troponins downtrending 28-21.  EKG shows AV paced rhythm.  Patient has been given full dose aspirin .   Complicated past medical history including CABG and subsequent redo stents. - Admit to a cardiac telemetry bed - Check echocardiogram - Continue statin and Plavix  - Cardiology consulted to evaluate, we will follow-up for any further recommendations  Complete heart block s/p permanent pacemaker -Pacemaker implanted 2008. Pacemaker generator change performed in January 2014 with lead change. Medtronic Adapta ADDRL1, Serial numberNWE [id number].  Pacemaker noted to be functioning properly when last checked in February. - Interrogate Medtronic pacemaker  Heart failure with preserved EF Patient clinically appears to be euvolemic at this time.  Chest x-ray did not reveal any acute abnormality.  Last echocardiogram from 10/2022 noted EF to be 50 to 55% with indeterminate diastolic parameters, mild mitral regurgitation, and mild to moderate tricuspid regurgitation. - Strict I&Os and daily weights - Continue torsemide   Paroxysmal atrial fibrillation Patient previously had been off of anticoagulation due to history of GI bleed, but reports being resumed recently Eliquis  taking samples. CHA2DS2-VASc score equal to 6. - Continue amiodarone  - Continue Eliquis    Chronic kidney disease stage IIIb Creatinine noted to be 1.83 with BUN 36 which appears around patient's baseline. - Continue to monitor kidney function  Essential hypertension Blood pressures elevated up to 185/94. - Continue current medication regimen  Diabetes mellitus type  2 Hemoglobin A1c 7.3 on 05/23/2023. - Hypoglycemia protocols - Continue Jardiance  - Hold glipizide  - CBGs before every meal with sensitive SSI  Hyperlipidemia Last lipid panel on 05/23/2023 as noted cholesterol, Total- 164, Triglycerides- 198, HDL- 50, VLDL Cholesterol Cal- 33, and LDL- 81. - Continue atorvastatin  Pancytopenia Chronic.  Hemoglobin noted to be 12.2 and platelet count 62 which appears around patient's baseline. - Continue to monitor  OSA -  Continue CPAP nightly  DVT prophylaxis: Eliquis  Advance Care Planning:   Code Status: Full Code   Consults: Cardiology send  Family Communication: Significant other updated at bedside  Severity of Illness: The appropriate patient status for this patient is OBSERVATION. Observation status is judged to be reasonable and necessary in order to provide the required intensity of service to ensure the patient's safety. The patient's presenting symptoms, physical exam findings, and initial radiographic and laboratory data in the context of their medical condition is felt to place them at decreased risk for further clinical deterioration. Furthermore, it is anticipated that the patient will be medically stable for discharge from the hospital within 2 midnights of admission.   Author: Lena Qualia, MD 06/02/2023 7:15 AM  For on call review [email or web address].

## 2023-06-02 NOTE — ED Notes (Signed)
 Pt ambulatory to and from the bathroom with minimal assist. Denies dizziness.

## 2023-06-02 NOTE — Consult Note (Addendum)
 Cardiology Consultation   Patient ID: Alan [name] MRN: [id number]; DOB: Nov 11, 1940  Admit date: 06/02/2023 Date of Consult: 06/02/2023  PCP:  Alan Handsome, MD   Alan [name] Cardiologist:  Alan Bourdon, MD   {   Patient Profile:   Alan [name] is a 83 y.o. 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   History of Present Illness:   Alan [name] with above complex PMH presented to ER today for chest pain. Patient states he has been having ongoing chest pain for more than 1 month. Pain occurs randomly, located at left side of his chest pain, feels like stabbing, average lasting 3-5 minutes. He felt pain is not triggered by exertion. He has noted increased SOB with exertion over the past month as well, walking to the bathroom gets him easily winded. He states his chest pain had escalated last night. He was resting in bed, had severe left sided chest pain for at least 2 hours. He was suppose to see Alan [name] next week but felt his symptoms are too bothersome and came to the ER. He denied any orthopnea, leg edema, bleeding. He states he has been taking torsemide  10mg  daily, stopped jardiance  for 1.5 weeks due to no further refills provided, not taking crestor , takes Eliquis  2.5mg  daily because his refill ran out and he had taken his friend's eliquis  for the past month. He is currently chest pain free. He is very hard of hearing. His significant other is at bedside.   Per ER workup today, CMP showed glucose 166, BUN  36, creatinine 1.83, GFR 37, albumin  3.4.  High sensitive troponin 28>21.  CBC with hemoglobin 12.2, platelets 69k.  Chest x-ray revealed no acute finding.  EKG revealed AV paced rhythm.  Patient is admitted to hospitalist service.  Echocardiogram has been requested and pending.  Cardiology is consulted today for further evaluation.   Per chart review:  He follows AHF clinic and [name] outpatient.   Patient has a long history of CAD. He had his initial CABG in 1990 (sequential LIMA to LAD and diagonal), multiple RCA PCI 1990-2004 and a redo CABG in 2004 (SVG-RCA-PDA, with surgical MAZE procedure, Alan. Argie [name]). Cath 2007 and 2012 show occluded LAD and RCA with patent grafts, 60% ostial OM and diffuse stenoses distal LCX. He has normal left ventricular systolic function with an EF of 55-60% by Echo on 06/25/2013. Most recent cardiac catheterization in November 2020 showed unchanged anatomy (the native LAD and RCA arteries are chronically occluded, but have good arterial supply via the LIMA and SVG bypasses, respectively, both appearing free of disease.  There was a 99% stenosis in the distal left circumflex coronary artery, too small for PCI.  The LVEF was 50-55%, but the LVEDP was elevated at 18 mmHg. He was recommended medical management.    He has a Medtronic PPM due to sinus arrest/complete heart block since 2008, historically atrial pacemaker dependent. He has paroxysmal atrial fibrillation and he is not on anticoagulation due to hx of recurrent GI bleeding.  He had a remote history of endocardial ablation at outside facility.  He has not had suitable anatomy for Watchman due to surgical clipping with partial closure of the LA appendage during CABG in the past. He had low burden of A fib since he started amiodarone  in December 2020.  Most recent device interrogation was from 04/04/23, normal device function, 3 years battery left, AP- VS 73%, AP- VP 26%, no atrial or ventricular high rate arrhythmia  noted.   He was admitted in 12/22 with CHF exacerbation.  Echo in 12/22 showed EF 55-60%, mild LVH, normal RV, moderate LAE. With diastolic CHF, PAF, biceps tendon rupture, and bilateral carpal tunnel syndrome, cardiac amyloidosis was considered.  PYP scan was not suggestive of TTR cardiac amyloidosis. 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 He has been seen by hematology, bone marrow biopsy in 3/23 showed plasma cell neoplasm with >10% clonal plasma cells, biopsy stained negative for amyloidosis.     Echo from 10/20/22 LVEF 50-55%, no RWMA, mod LVH, unable assess diastolic function, severely reduced RV, PASP 32.8 mmHg, mod LAE, severe RAE, mild MR, mild to mod TR, borderline dilatation of the ascending aorta 39 mm.   In 10/24, he was admitted to a hospital in [place] with suspected TIA.  BP was markedly high. CT head was negative, CTA head/neck showed no carotid stenosis.  He was unable to get an MRI due to his pacemaker. Echo showed EF 50-55%, moderate LVH, mild MR, mild-moderate TR, moderate RV dysfunction, biatrial enlargement, IVC not dilated.    Follow up 11/24, NYHA III and mildly volume overloaded. Torsemide  increased to 10 mg daily and spiro 12.5 daily added. With concern of TIA and partially closed LA appendage from surgical clip, Plavix  stopped and Eliquis  2.5 mg bid started.    Admitted to [facility] 12/31/22 with AKI 2/2 acute gastroenteritis. Received IVF and abx. CT A/P unremarkable. SCr peaked at 2.54. He was discharged home, weight 198 lbs.   01/12/23 he last followed up with AHF cllinic after hospital discharge. He was doing well without CHF symptoms.  Weight at home 204 pounds. He reported taking all medication and had some confusion about medications. He was using CPAP. Due to mild hyperkalemia 5.3 and Cr 2.6, spironolactone  was stopped and he was advised to continue torsemide  10 mg every other day and  Jardiance  10 mg daily.  He was taking  ASA + Plavix  for CAD at the time, due to TIA and PAF, he was started on Eliquis  2.5mg  BID and advised to stop DAPT. He was continued on Repatha , Zetia  + Crestor  for CAD. He was noted in sinus rhythm and was told to continue amiodarone  200mg  daily for PAF.   He follows hematology for smoldering myeloma, last seen 04/02/23, lab work at the time showed anemia with Hgb of 10.7 g/dL with Hct of 16.1% and low platelets of 80 K. CMP stable. LDH wnl at 142. Myeloma panel shows stable M spike of 1.8g/dl. It was felt he had no clinical or lab findings suggestive of active myeloma requiring treatment. He was recommended to monitor labs every 6 months.      Home Medications:  Prior to Admission medications   Medication Sig Start Date End Date Taking? Authorizing Provider  amiodarone  (PACERONE ) 200 MG tablet Take 200 mg by mouth daily. 03/30/23  Yes [provider]  apixaban  (ELIQUIS ) 5 MG TABS tablet Take 5 mg by mouth daily.   Yes [provider]  carvedilol  (COREG )  25 MG tablet Take 50 mg by mouth 2 (two) times daily. 04/03/23  Yes [provider]  ezetimibe  (ZETIA ) 10 MG tablet Take 10 mg by mouth daily. 05/12/23  Yes [provider]  glipiZIDE  (GLUCOTROL ) 10 MG tablet Take 10 mg by mouth 2 (two) times daily. 05/08/23  Yes [provider]  insulin  lispro (HUMALOG ) 100 UNIT/ML injection Inject into the skin 2 (two) times daily after a meal. Per sliding scale 05/10/23  Yes [provider]  JARDIANCE  10 MG TABS tablet Take 10 mg by mouth daily. 02/13/23  Yes [provider]  torsemide  (DEMADEX ) 20 MG tablet Take 10 mg by mouth daily. 05/12/23  Yes [provider]  ASPIRIN  LOW DOSE 81 MG tablet Take 81 mg by mouth daily. Patient not taking: Reported on 06/02/2023 05/14/23   [provider]  baclofen  (LIORESAL ) 10 MG tablet Take 10 mg by mouth daily. Patient not taking: Reported on 06/02/2023 05/12/23   [provider]  clopidogrel   (PLAVIX ) 75 MG tablet Take 75 mg by mouth daily. Patient not taking: Reported on 06/02/2023 05/11/23   [provider]  DULoxetine (CYMBALTA) 60 MG capsule Take 60 mg by mouth daily. Patient not taking: Reported on 06/02/2023 05/08/23   [provider]  spironolactone  (ALDACTONE ) 25 MG tablet Take 12.5 mg by mouth daily. Patient not taking: Reported on 06/02/2023 05/12/23   [provider]    Inpatient Medications: Scheduled Meds:  amiodarone   200 mg Oral Daily   [START ON 06/03/2023] aspirin  EC  81 mg Oral Daily   carvedilol   50 mg Oral BID   empagliflozin   10 mg Oral Daily   enoxaparin  (LOVENOX ) injection  40 mg Subcutaneous Q24H   ezetimibe   10 mg Oral Daily   sodium chloride  flush  3 mL Intravenous Q12H   Continuous Infusions:  PRN Meds: acetaminophen  **OR** acetaminophen , albuterol   Allergies:   No Known Allergies  Social History:   Social History   Socioeconomic History   Marital status: Single    Spouse name: Not on file   Number of children: Not on file   Years of education: Not on file   Highest education level: Not on file  Occupational History   Not on file  Tobacco Use   Smoking status: Not on file   Smokeless tobacco: Not on file  Substance and Sexual Activity   Alcohol use: Not on file   Drug use: Not on file   Sexual activity: Not on file  Other Topics Concern   Not on file  Social History Narrative   Not on file   Social Drivers of Health   Financial Resource Strain: Not on file  Food Insecurity: Not on file  Transportation Needs: Not on file  Physical Activity: Not on file  Stress: Not on file  Social Connections: Not on file  Intimate Partner Violence: Not on file    Family History:   History reviewed. No pertinent family history.   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84 kg/m.   Vitals:  Vitals:   06/02/23 1226 06/02/23 1236  BP:  (!) 170/78  Pulse:  64  Resp:  19  Temp: 98.4 F (36.9 C) 97.6 F (36.4 C)  SpO2:  100%   General Appearance: In no apparent distress, sitting in bed, well-nourished HEENT: Normocephalic, atraumatic. Neck: Supple, trachea midline, no JVDs Cardiovascular: Regular rate and rhythm, normal S1-S2, no murmur Respiratory: Resting breathing unlabored, lungs sounds clear to auscultation bilaterally, no use of accessory muscles.  On room air. Gastrointestinal: Bowel sounds positive, abdomen obese, soft, non-tender, non-distended. Extremities: Able to move all extremities in bed without difficulty, no pitting edema bilateral lower extremity, right foot mild trace edema with discoloration secondary to history of polio  Musculoskeletal: Normal muscle bulk and tone Skin: Intact, warm, dry. No rashes or petechiae noted in exposed areas.  Neurologic: Alert, oriented to person, place and time.  Hard of hearing, no cognitive deficit, follow commands appropriately   psychiatric: Normal affect. Mood is appropriate.    EKG:  The EKG was personally reviewed and demonstrates:    A paced, V sensed rhythm, non-specific T wave abnormalities, reviewed with MD  Telemetry:  Telemetry was personally reviewed and demonstrates:    AV paced rhythm   Relevant CV Studies:   Remote device interrogation 04/03/23:   Remote reviewed.   Not pacemaker dependent. Battery status is good. Lead measurements are stable. Heart rate histogram is favorable. No clinically significant episodes of high ventricular rate or atrial mode switch noted.  Luana Rumple, MD     Echo from 10/20/22:  1. Left ventricular ejection fraction, by estimation, is 50 to 55%. Left  ventricular ejection fraction by 2D MOD biplane is 55.2 %. The left  ventricle has low normal function. The left ventricle has no regional wall  motion abnormalities. There is  moderate left ventricular hypertrophy. Left ventricular diastolic function  could not be evaluated.   2. Right ventricular systolic function is severely reduced. The right  ventricular size is moderately enlarged. There is normal pulmonary artery  systolic pressure. The estimated right ventricular systolic pressure is  32.8 mmHg.   3. Left atrial size was moderately dilated.   4. Right atrial size was severely dilated.   5. The mitral valve is abnormal. Mild mitral valve regurgitation.   6. The tricuspid valve is abnormal. Tricuspid valve regurgitation is mild  to moderate.   7. The aortic valve is tricuspid. Aortic valve regurgitation is not  visualized.   8. Aortic dilatation noted. There is borderline dilatation of the  ascending aorta, measuring 39 mm.   9. The inferior vena cava is normal in size with greater than 50%  respiratory variability, suggesting right atrial pressure of 3 mmHg.   Comparison(s): Changes from prior study are noted. 02/03/2021: LVEF  55-60%, normal RV systolic function.   Cath 12/18/18:  Prox  LAD lesion  is 100% stenosed. 2nd Mrg lesion is 40% stenosed. Dist Cx lesion is 99% stenosed. The left ventricular systolic function is normal. LV end diastolic pressure is mildly elevated. The left ventricular ejection fraction is 50-55% by visual estimate. Prox RCA lesion is 100% stenosed. Mid RCA to Dist RCA lesion is 70% stenosed. SVG. The graft exhibits no disease. LIMA and is normal in caliber. The graft exhibits no disease.   1.  Significant underlying three-vessel coronary artery disease with patent grafts including LIMA to LAD/diagonal and SVG to right PDA.  There is significant diffuse disease in the distal left circumflex (AV groove) supplying a small area. 2.  Normal LV systolic function with mildly elevated left ventricular end-diastolic pressure at 16 mmHg.   Recommendations: I recommend continuing medical therapy.  The AV groove left circumflex is too small and diseased in a long segment.  It supplies a small area and I doubt that this is giving him much symptoms. The patient likely has diastolic dysfunction given elevated LVEDP.    Laboratory Data:  High Sensitivity Troponin:   Recent Labs  Lab 06/02/23 0346 06/02/23 0542  TROPONINIHS 28* 21*     Chemistry Recent Labs  Lab 06/02/23 0346  NA 136  K 4.5  CL 105  CO2 24  GLUCOSE 166*  BUN 36*  CREATININE 1.83*  CALCIUM  9.0  GFRNONAA 37*  ANIONGAP 7    Recent Labs  Lab 06/02/23 0349  PROT 8.3*  ALBUMIN  3.4*  AST 33  ALT 26  ALKPHOS 60  BILITOT 0.7   Lipids No results for input(s): "CHOL", "TRIG", "HDL", "LABVLDL", "LDLCALC", "CHOLHDL" in the last 168 hours.  Hematology Recent Labs  Lab 06/02/23 0346  WBC 5.7  RBC 3.76*  HGB 12.2*  HCT 36.9*  MCV 98.1  MCH 32.4  MCHC 33.1  RDW 13.1  PLT 69*   Thyroid  No results for input(s): "TSH", "FREET4" in the last 168 hours.  BNPNo results for input(s): "BNP", "PROBNP" in the last 168 hours.  DDimer No results for input(s): "DDIMER" in the last  168 hours.   Radiology/Studies:  DG Chest 2 View Result Date: 06/02/2023 CLINICAL DATA:  Chest pain EXAM: CHEST - 2 VIEW COMPARISON:  09/05/2022 FINDINGS: Lungs are clear. No pneumothorax or pleural effusion. Status post coronary artery bypass grafting. Stable cardiomegaly. Left subclavian 3 lead pacemaker is unchanged. One lead appears disconnected, unchanged. Bright vascularity is normal. No acute bone abnormality. IMPRESSION: 1. No active cardiopulmonary disease. 2. Stable cardiomegaly. Electronically Signed   By: Worthy Heads M.D.   On: 06/02/2023 04:08     Assessment and Plan:   Chest pain CAD with hx of CABG and redo and PCIs - Presented with 1 month of intermittent chest pain that has escalated yesterday, not associated with exertion, associated with shortness of breath with exertion, mixed feature - High sensitive troponin 28>21; CKD stage III-IV stable; thrombocytopenia with platelets 69k - EKG nondiagnostic - Chest x-ray revealing no acute finding - Echocardiogram has been requested, will follow - Troponin elevation is not consistent with ACS, chest pain possibly representing unstable angina versus CHF decompesation, patient with stable renal disease and thrombocytopenia, discussed risk versus benefit of cardiac catheterization, will defer to patient's primary cardiologist for further discussion on this matter, would not cath unless strongly suspected ischemia  - Medical therapy: Not antiplatelet due to concurrent DOAC use, however only taking Eliquis  2.5 mg once a day; continue Zetia  and Repatha , not on statin due to myopathy; continue PTA carvedilol   50 mg twice daily  HFmrEF with RV dysfunction  - Patient endorses 1 month onset of shortness of breath with exertion, along with intermittent chest pain - Most recent echocardiogram as mentioned above, repeat echo is pending today - Chest x-ray revealing no acute finding today - Clinically he does not appear overtly volume  overloaded - On PTA torsemide  10 mg daily, will trial IV Lasix  for diuresis  - GDMT: On PTA Coreg  50 mg twice daily, continue; self discontinued Jardiance  10 mg daily for 1.5 weeks due to no further refills, this should be resumed before discharge if no contraindication; spironolactone  was stopped during previous office visit however he continued to take, no resume for now   Paroxysmal A fib - Currently in paced rhythm, PPM interrogation pending - Continue chronic amiodarone  200 mg daily - No recent TSH, will check; LFTs WNL today - Supposed to take Eliquis  2.5 mg twice daily, he reports his refill ran out and he has been taking his friend's Eliquis  2.5 mg daily, will need to resume 2.5mg  BID due to advanced age and renal function, if no cath is planned  Sinus node arrest/CHB s/p PPM  - Messaged to ER staff to interrogate Medtronic pacemaker today - Last device interrogation from 04/04/2023 revealed normal device function, no atrial or ventricular arrhythmia  HTN HLD CKD III-IV DM Smoldering myeloma Anemia Thrombocytopenia  OSA - per primary team    Risk Assessment/Risk Scores:      New [place]  Heart Association (NYHA) Functional Class NYHA Class II  CHA2DS2-VASc Score = 6  This indicates a 9.7% annual risk of stroke. The patient's score is based upon: CHF History: 1 HTN History: 1 Diabetes History: 1 Stroke History: 0 Vascular Disease History: 1 Age Score: 2 Gender Score: 0     For questions or updates, please contact [facility] Please consult [email or web address] for contact info under    Signed, Xika Zhao, NP  06/02/2023 12:54 PM  I have seen and examined the patient along with Xika Zhao, NP .  I have reviewed the chart, notes and new data.  I agree with PA/NP's note.  Key new complaints: His complaint is really not of chest pain.  He describes an unpleasant "tapping" sensation in the left lower chest that occurs at rest, unpredictably.  Usually it is just  once or twice, but prior to admission he was having this very frequently, again intermittently, over a period of a couple of hours. Key examination changes: Healthy pacemaker site.  Clear lungs.  Normal jugular venous pulsations, no edema.  Regular rate and rhythm with normal S1 and widely split S2, but with frequent breaks in the rhythm, which on telemetry are due to Wenckebach cycles. This lead to intermittent V pacing. Key new findings / data:  Full PM interrogation performed. Generator longeivty 3 years, presenting ApVs with occ Vp, all lead parameters stable, programmed AAIR-DDDR with lower rate 60, no VT and no AFib recently. No detectable P waves and is "atrially dependent", but has AV conduction. On ECG and PM interrogation has 100% Atrial pacing (no underlying atrial rhythm) and mostly V sensed beats. Has a very long AV conduction time, around 450 ms. Does have 27% V pacing, probably mostly due to frequent AV Wenckebach cycles. During the exam, V pacing occurred repeatedly and he was immediately aware of it, although he cannot confirm this is the same "tapping" sensation he had at home. He is aware when the rhythm changes to ApVp, but not when  it is persistently ApVp. No ischemic changes on ECG. Minimally elevated troponin, adynamic. BNP 166 (previous range 76-246). Normal TSH and LFTS. Low normal Hgb, stable moderate Tbpenia. Echo pending.  PLAN: Changed PM setting to committed AV sequential pacing (DDDR with fixed paced AV delay at 220 ms). Let's see if this fixes the complaint. He is on amiodarone  and a very hi dose of carvedilol  for AFib suppression/HF/HTN and BP is fairly high nonetheless. Limited choices for BP due to renal dysfunction. Dedicated V pacing may not be a good choice since the LV dyssynchrony may increase the risk of HF exacerbation, but his LVEF is minimally decreased and he may tolerate it just fine.  If he has no "tapping" overnight, at least we may understand the source  of his symptoms and then can try to return to ApVs rhythm by reducing the amount of AV blockers. For example add Bidil and reduce the beta blocker. Symptoms are not consistent with a coronary syndrome and there is no evidence to suggest acute HF exacerbation. Will review echo. Restart SGLT2i, inadvertently stopped 10 days ago. History of GI bleeding. Combination of Eliquis  with clopidogrel  lower risk than with ASA. No plan for cardiac cath.  Luana Rumple, MD, [facility] [phone] 06/02/2023, 3:00 PM

## 2023-06-02 NOTE — ED Notes (Signed)
 Report given to [facility]

## 2023-06-02 NOTE — ED Notes (Signed)
 CCMD notified. Pt is on monitor.

## 2023-06-02 NOTE — ED Triage Notes (Signed)
 Pt is coming from home with chest pain that started 2 to 3 hours ago, he is no diaphoretic, no shortness of breath and no vomiting. He is alert and oriented at this time with a GCS of 15.

## 2023-06-02 NOTE — ED Notes (Signed)
 Pt resting, denies chest pain or any kind of pain. Spouse at bedside.

## 2023-06-02 NOTE — ED Provider Notes (Signed)
[place] [facility]MENT AT [facility] Provider Note   CSN: [id number] Arrival date & time: 06/02/23  4696     History  Chief Complaint  Patient presents with   Chest Pain    Alan [name] is a 83 y.o. male.  The history is provided by the patient, the spouse and medical records.  Chest Pain Alan [name] is a 83 y.o. male who presents to the Emergency Department complaining of chest pain.  He presents to the emergency department accompanied for evaluation of 2 to 3 hours of left-sided chest pain.  Pain is waxing and waning described as sharp in nature.  No associated diaphoresis, shortness of breath, abdominal pain, nausea, vomiting.  He has had some belching per partner.  Symptoms are moderate and intermittent.  He has experienced similar episode in the past secondary to cardiac disease.     Home Medications Prior to Admission medications   Medication Sig Start Date End Date Taking? Authorizing Provider  amiodarone  (PACERONE ) 200 MG tablet Take 200 mg by mouth daily. 03/30/23  Yes [provider]  apixaban  (ELIQUIS ) 5 MG TABS tablet Take 5 mg by mouth daily.   Yes [provider]  carvedilol  (COREG ) 25 MG tablet Take 50 mg by mouth 2 (two) times daily. 04/03/23  Yes [provider]  clopidogrel  (PLAVIX ) 75 MG tablet Take 75 mg by mouth daily. 05/11/23  Yes [provider]  ezetimibe  (ZETIA ) 10 MG tablet Take 10 mg by mouth daily. 05/12/23  Yes [provider]  glipiZIDE  (GLUCOTROL ) 10 MG tablet Take 10 mg by mouth 2 (two) times daily. 05/08/23  Yes [provider]  insulin  lispro (HUMALOG ) 100 UNIT/ML injection Inject into the skin 2 (two) times daily after a meal. Per sliding scale 05/10/23  Yes [provider]  JARDIANCE  10 MG TABS tablet Take 10 mg by mouth daily. 02/13/23  Yes [provider]  torsemide  (DEMADEX ) 20 MG tablet Take 10 mg by mouth daily. 05/12/23  Yes [provider]  ASPIRIN   LOW DOSE 81 MG tablet Take 81 mg by mouth daily. Patient not taking: Reported on 06/02/2023 05/14/23   [provider]      Allergies    Patient has no known allergies.    Review of Systems   Review of Systems  Cardiovascular:  Positive for chest pain.  All other systems reviewed and are negative.   Physical Exam Updated Vital Signs BP (!) 149/70   Pulse 60   Temp 98.4 F (36.9 C)   Resp 15   Ht 5' 9.5" (1.765 m)   Wt 93 kg   SpO2 100%   BMI 29.84 kg/m  Physical Exam Vitals and nursing note reviewed.  Constitutional:      Appearance: He is well-developed.  HENT:     Head: Normocephalic and atraumatic.  Cardiovascular:     Rate and Rhythm: Normal rate and regular rhythm.     Heart sounds: No murmur heard. Pulmonary:     Effort: Pulmonary effort is normal. No respiratory distress.     Breath sounds: Normal breath sounds.  Abdominal:     Palpations: Abdomen is soft.     Tenderness: There is no abdominal tenderness. There is no guarding or rebound.  Musculoskeletal:        General: No tenderness.     Comments: Nonpitting edema to bilateral lower extremities  Skin:    General: Skin is warm and dry.  Neurological:  Mental Status: He is alert and oriented to person, place, and time.  Psychiatric:        Behavior: Behavior normal.     ED Results / Procedures / Treatments   Labs (all labs ordered are listed, but only abnormal results are displayed) Labs Reviewed  BASIC METABOLIC PANEL WITH GFR - Abnormal; Notable for the following components:      Result Value   Glucose, Bld 166 (*)    BUN 36 (*)    Creatinine, Ser 1.83 (*)    GFR, Estimated 37 (*)    All other components within normal limits  CBC - Abnormal; Notable for the following components:   RBC 3.76 (*)    Hemoglobin 12.2 (*)    HCT 36.9 (*)    Platelets 69 (*)    All other components within normal limits  HEPATIC FUNCTION PANEL - Abnormal; Notable for the following components:   Total  Protein 8.3 (*)    Albumin  3.4 (*)    All other components within normal limits  TROPONIN I (HIGH SENSITIVITY) - Abnormal; Notable for the following components:   Troponin I (High Sensitivity) 28 (*)    All other components within normal limits  TROPONIN I (HIGH SENSITIVITY) - Abnormal; Notable for the following components:   Troponin I (High Sensitivity) 21 (*)    All other components within normal limits  LIPASE, BLOOD    EKG EKG Interpretation Date/Time:  Saturday June 02 2023 03:44:38 EDT Ventricular Rate:  61 PR Interval:  249 QRS Duration:  178 QT Interval:  489 QTC Calculation: 493 R Axis:   -49  Text Interpretation: AV PACED RHYTHM Confirmed by Kelsey Patricia [phone]) on 06/02/2023 4:15:24 AM  Radiology DG Chest 2 View Result Date: 06/02/2023 CLINICAL DATA:  Chest pain EXAM: CHEST - 2 VIEW COMPARISON:  09/05/2022 FINDINGS: Lungs are clear. No pneumothorax or pleural effusion. Status post coronary artery bypass grafting. Stable cardiomegaly. Left subclavian 3 lead pacemaker is unchanged. One lead appears disconnected, unchanged. Bright vascularity is normal. No acute bone abnormality. IMPRESSION: 1. No active cardiopulmonary disease. 2. Stable cardiomegaly. Electronically Signed   By: Worthy Heads M.D.   On: 06/02/2023 04:08    Procedures Procedures    Medications Ordered in ED Medications  aspirin  chewable tablet 324 mg (324 mg Oral Given 06/02/23 0436)    ED Course/ Medical Decision Making/ A&P                                 Medical Decision Making Amount and/or Complexity of Data Reviewed Labs: ordered. Radiology: ordered.  Risk OTC drugs. Decision regarding hospitalization.   Patient with extensive cardiac history including CABG, permanent pacemaker placement for heart block and atrial fibrillation, CKD, multiple myeloma here for evaluation of chest pain, similar to prior cardiac events.  EKG is with paced rhythm.  Troponins are mildly elevated but  flat.  Pain did improve with aspirin  administration.  Labs with stable CKD, anemia and thrombocytopenia.  Given his extensive cardiac history, concerning symptoms medicine consulted for admission for chest pain observation.  Patient updated of findings of studies and he is in agreement with admission for ongoing care.        Final Clinical Impression(s) / ED Diagnoses Final diagnoses:  Precordial pain    Rx / DC Orders ED Discharge Orders     None         Kelsey Patricia, MD 06/02/23  0701  

## 2023-06-02 NOTE — Plan of Care (Signed)

## 2023-06-03 ENCOUNTER — Ambulatory Visit (HOSPITAL_COMMUNITY)

## 2023-06-03 DIAGNOSIS — R002 Palpitations: Secondary | ICD-10-CM

## 2023-06-03 DIAGNOSIS — R7989 Other specified abnormal findings of blood chemistry: Secondary | ICD-10-CM | POA: Diagnosis not present

## 2023-06-03 DIAGNOSIS — I495 Sick sinus syndrome: Secondary | ICD-10-CM

## 2023-06-03 DIAGNOSIS — I441 Atrioventricular block, second degree: Secondary | ICD-10-CM

## 2023-06-03 DIAGNOSIS — I480 Paroxysmal atrial fibrillation: Secondary | ICD-10-CM | POA: Diagnosis not present

## 2023-06-03 DIAGNOSIS — N1832 Chronic kidney disease, stage 3b: Secondary | ICD-10-CM | POA: Diagnosis not present

## 2023-06-03 DIAGNOSIS — I5032 Chronic diastolic (congestive) heart failure: Secondary | ICD-10-CM

## 2023-06-03 LAB — BASIC METABOLIC PANEL WITH GFR
Anion gap: 4 — ABNORMAL LOW (ref 5–15)
BUN: 30 mg/dL — ABNORMAL HIGH (ref 8–23)
CO2: 26 mmol/L (ref 22–32)
Calcium: 8.8 mg/dL — ABNORMAL LOW (ref 8.9–10.3)
Chloride: 105 mmol/L (ref 98–111)
Creatinine, Ser: 1.69 mg/dL — ABNORMAL HIGH (ref 0.61–1.24)
GFR, Estimated: 40 mL/min — ABNORMAL LOW (ref >60)
Glucose, Bld: 138 mg/dL — ABNORMAL HIGH (ref 70–99)
Potassium: 4.4 mmol/L (ref 3.5–5.1)
Sodium: 135 mmol/L (ref 135–145)

## 2023-06-03 LAB — CBC
HCT: 34 % — ABNORMAL LOW (ref 39.0–52.0)
Hemoglobin: 11.2 g/dL — ABNORMAL LOW (ref 13.0–17.0)
MCH: 31.6 pg (ref 26.0–34.0)
MCHC: 32.9 g/dL (ref 30.0–36.0)
MCV: 96 fL (ref 80.0–100.0)
Platelets: 62 10*3/uL — ABNORMAL LOW (ref 150–400)
RBC: 3.54 MIL/uL — ABNORMAL LOW (ref 4.22–5.81)
RDW: 12.9 % (ref 11.5–15.5)
WBC: 5 10*3/uL (ref 4.0–10.5)
nRBC: 0 % (ref 0.0–0.2)

## 2023-06-03 MED ORDER — APIXABAN 2.5 MG PO TABS: 2.5000 mg | ORAL_TABLET | Freq: Two times a day (BID) | ORAL | 3 refills | Status: DC

## 2023-06-03 MED ORDER — JARDIANCE 10 MG PO TABS: 10.0000 mg | ORAL_TABLET | Freq: Every day | ORAL | 0 refills | Status: DC

## 2023-06-03 NOTE — Plan of Care (Signed)
  Problem: Education: Goal: Knowledge of General Education information will improve Description: Including pain rating scale, medication(s)/side effects and non-pharmacologic comfort measures Outcome: Adequate for Discharge   Problem: Health Behavior/Discharge Planning: Goal: Ability to manage health-related needs will improve Outcome: Adequate for Discharge   Problem: Clinical Measurements: Goal: Ability to maintain clinical measurements within normal limits will improve Outcome: Adequate for Discharge   Problem: Clinical Measurements: Goal: Will remain free from infection Outcome: Adequate for Discharge   

## 2023-06-03 NOTE — Progress Notes (Incomplete)
 PROGRESS NOTE    Alan [name]  WUJ:[id number] DOB: [birth year] DOA: 06/02/2023 PCP: [name], MD (Confirm with patient/family/NH records and if not entered, this HAS to be entered at [facility] point of entry. "No PCP" if truly none.)   Chief Complaint  Patient presents with   Chest Pain    Brief Narrative: (Start on day 1 of progress note - keep it brief and live) ***   Assessment & Plan:   Principal Problem:   Chest pain Active Problems:   Elevated troponin   Complete heart block (HCC)   Heart failure with preserved ejection fraction (HCC)   Paroxysmal atrial fibrillation (HCC)   Chronic kidney disease, stage III (moderate) (HCC)   Essential hypertension   Diabetes mellitus type 2, insulin  dependent (HCC)   Hyperlipidemia   Pancytopenia (HCC)   SSS (sick sinus syndrome) (HCC)   Second degree Mobitz I AV block   Chronic diastolic heart failure (HCC)   ***   DVT prophylaxis: (Lovenox /Heparin /SCD's/anticoagulated/None (if comfort care) Code Status: (Full/Partial - specify details) Family Communication: (Specify name, relationship & date discussed. NO "discussed with patient") Disposition:   Status is: Observation {Observation:23811}   Consultants:  ***  Procedures: (Don't include imaging studies which can be auto populated. Include things that cannot be auto populated i.e. Echo, Carotid and venous dopplers, Foley, Bipap, HD, tubes/drains, wound vac, central lines etc) ***  Antimicrobials: (specify start and planned stop date. Auto populated tables are space occupying and do not give end dates) ***    Subjective: ***  Objective: Vitals:   06/02/23 1552 06/02/23 1948 06/02/23 2324 06/03/23 0751  BP: (!) 158/69 (!) 143/69 139/63 (!) 140/66  Pulse: 63 69 61   Resp:  16 19   Temp: 97.6 F (36.4 C) 97.9 F (36.6 C) 97.8 F (36.6 C) 97.8 F (36.6 C)  TempSrc: Oral  Oral Oral  SpO2: 100% 98% 96% 98%  Weight:      Height:       No intake or output  data in the 24 hours ending 06/03/23 1113 Filed Weights   06/02/23 0438  Weight: 93 kg    Examination:  General exam: Appears calm and comfortable  Respiratory system: Clear to auscultation. Respiratory effort normal. Cardiovascular system: S1 & S2 heard, RRR. No JVD, murmurs, rubs, gallops or clicks. No pedal edema. Gastrointestinal system: Abdomen is nondistended, soft and nontender. No organomegaly or masses felt. Normal bowel sounds heard. Central nervous system: Alert and oriented. No focal neurological deficits. Extremities: Symmetric 5 x 5 power. Skin: No rashes, lesions or ulcers Psychiatry: Judgement and insight appear normal. Mood & affect appropriate.     Data Reviewed: I have personally reviewed following labs and imaging studies  CBC: Recent Labs  Lab 06/02/23 0346 06/03/23 0210  WBC 5.7 5.0  HGB 12.2* 11.2*  HCT 36.9* 34.0*  MCV 98.1 96.0  PLT 69* 62*    Basic Metabolic Panel: Recent Labs  Lab 06/02/23 0346 06/03/23 0210  NA 136 135  K 4.5 4.4  CL 105 105  CO2 24 26  GLUCOSE 166* 138*  BUN 36* 30*  CREATININE 1.83* 1.69*  CALCIUM  9.0 8.8*    GFR: Estimated Creatinine Clearance: 38.9 mL/min (A) (by C-G formula based on SCr of 1.69 mg/dL (H)).  Liver Function Tests: Recent Labs  Lab 06/02/23 0349  AST 33  ALT 26  ALKPHOS 60  BILITOT 0.7  PROT 8.3*  ALBUMIN  3.4*    CBG: Recent Labs  Lab 06/02/23  2204  GLUCAP 149*     Recent Results (from the past 240 hours)  MRSA Next Gen by PCR, Nasal     Status: None   Collection Time: 06/02/23 10:04 PM   Specimen: Nasal Mucosa; Nasal Swab  Result Value Ref Range Status   MRSA by PCR Next Gen NOT DETECTED NOT DETECTED Final    Comment: (NOTE) The GeneXpert MRSA Assay (FDA approved for NASAL specimens only), is one component of a comprehensive MRSA colonization surveillance program. It is not intended to diagnose MRSA infection nor to guide or monitor treatment for MRSA infections. Test  performance is not FDA approved in patients less than 13 years old. Performed at [facility], [address], [place], [place] [address]          Radiology Studies: DG Chest 2 View Result Date: 06/02/2023 CLINICAL DATA:  Chest pain EXAM: CHEST - 2 VIEW COMPARISON:  09/05/2022 FINDINGS: Lungs are clear. No pneumothorax or pleural effusion. Status post coronary artery bypass grafting. Stable cardiomegaly. Left subclavian 3 lead pacemaker is unchanged. One lead appears disconnected, unchanged. Bright vascularity is normal. No acute bone abnormality. IMPRESSION: 1. No active cardiopulmonary disease. 2. Stable cardiomegaly. Electronically Signed   By: Worthy Heads M.D.   On: 06/02/2023 04:08        Scheduled Meds:  amiodarone   200 mg Oral Daily   apixaban   2.5 mg Oral BID   carvedilol   50 mg Oral BID   clopidogrel   75 mg Oral Daily   empagliflozin   10 mg Oral Daily   ezetimibe   10 mg Oral Daily   sodium chloride  flush  3 mL Intravenous Q12H   Continuous Infusions:   LOS: 0 days    Time spent: ***    Hilda Lovings, MD [facility]   To contact the attending provider between 7A-7P or the covering provider during after hours 7P-7A, please log into the web site [email or web address] and access using universal [place] password for that web site. If you do not have the password, please call the hospital operator.  06/03/2023, 11:13 AM

## 2023-06-03 NOTE — Progress Notes (Signed)
   Patient Name: Alan [name] Date of Encounter: 06/03/2023 [facility] Cardiologist: Peder Bourdon, MD   Interval Summary  .    Feels well this morning.  Denies any chest pain or shortness of breath and is no longer bothered by the tapping sensation in his left chest.  Blood pressure is better this morning.  Vital Signs .    Vitals:   06/02/23 1552 06/02/23 1948 06/02/23 2324 06/03/23 0751  BP: (!) 158/69 (!) 143/69 139/63 (!) 140/66  Pulse: 63 69 61   Resp:  16 19   Temp: 97.6 F (36.4 C) 97.9 F (36.6 C) 97.8 F (36.6 C) 97.8 F (36.6 C)  TempSrc: Oral  Oral Oral  SpO2: 100% 98% 96% 98%  Weight:      Height:       No intake or output data in the 24 hours ending 06/03/23 1057    06/02/2023    4:38 AM  Last 3 Weights  Weight (lbs) 205 lb  Weight (kg) 92.987 kg      Telemetry/ECG    100% AV sequential pacing- Personally Reviewed  Physical Exam .   GEN: No acute distress.   Neck: No JVD Cardiac: RRR, paradoxically split second heart sound, no murmurs, rubs, or gallops.  Respiratory: Clear to auscultation bilaterally. GI: Soft, nontender, non-distended  MS: No edema  Assessment & Plan .     Reprogramming his pacemaker to DDDR mode with physiological AV delays has led to resolution of his palpitations, which were due to episodes of second-degree AV block Mobitz type I. Due to age, high-dose carvedilol  and amiodarone  his AV conduction is extremely lengthy, PR interval over 450 ms. While it is possible that he will lose a little bit of left ventricular efficiency due to pacing induced dyssynchrony, it is possible that this will be compensated by better AV synchrony. Discussed the fact that if there is any evidence of worsening heart failure after these pacing changes, we may decide to adjust down his dose of beta-blocker, even change his pacemaker to a CRT device. He has a follow-up appointment with his heart failure specialist later this week. No changes in  medications.  For questions or updates, please contact [facility] Please consult [email or web address] for contact info under        Signed, Luana Rumple, MD

## 2023-06-03 NOTE — Care Management Obs Status (Signed)
 MEDICARE OBSERVATION STATUS NOTIFICATION   Patient Details  Name: Alan [name] MRN: [id number] Date of Birth: December 08, 1940   Medicare Observation Status Notification Given:  Yes    Jannine Meo, RN 06/03/2023, 7:58 AM

## 2023-06-03 NOTE — Discharge Summary (Signed)
 Physician Discharge Summary  [name] GNF:[id number] DOB: 1940/04/18 DOA: 06/02/2023  PCP: [name], MD  Admit date: 06/02/2023 Discharge date: 06/03/2023  Time spent: 60 minutes  Recommendations for Outpatient Follow-up:  Follow-up with Dr. Mitzie Anda, advanced heart failure clinic as scheduled.   Discharge Diagnoses:  Principal Problem:   Palpitations Active Problems:   Elevated troponin   Complete heart block (HCC)   Heart failure with preserved ejection fraction (HCC)   Paroxysmal atrial fibrillation (HCC)   Chronic kidney disease, stage III (moderate) (HCC)   Essential hypertension   Diabetes mellitus type 2, insulin  dependent (HCC)   Hyperlipidemia   Pancytopenia (HCC)   SSS (sick sinus syndrome) (HCC)   Second degree Mobitz I AV block   Chronic diastolic heart failure (HCC)   Discharge Condition: Stable and improved.  Diet recommendation: Heart healthy  Filed Weights   06/02/23 0438  Weight: 93 kg    History of present illness:  HPI per Dr. Francena Infield [name] is a 83 y.o. male with medical history significant of hypertension,  CAD s/p CABG and redo, chronic HFpEF, CHB with pacemaker, diabetes mellitus type 2, smoldering myeloma, atrial fibrillation , and CKD 3B presents with complaints of chest pain.   The chest pain comes and goes in waves, starting around 10 PM last night. The pain is located on the left side of his chest, around the heart, and does not radiate to the neck or arm. He describes the pain as a sharp 'spike type feeling' that occurs occasionally. No nausea, vomiting, or sweating accompany the pain.   He has a history of shortness of breath and reduced stamina, noting that he can walk about 300 feet downhill to his mailbox but struggles on the return uphill, often needing to take a break. No recent leg swelling or calf pain. His significant other notes that he has experienced leg swelling in the past, which has since been managed with  diuretics.   He has a long history of cardiac issues, having undergone multiple catheterizations in the past.  Stating that normally whenever he has a heart issue he requires a catheterization   In the emergency department patient was noted to be afebrile with pulse 59-64, blood pressures 143/63 to 185/94, all other vital signs maintained.  Labs significant for high-sensitivity troponin 28->21.  Other labs appear near baseline include hemoglobin 12.2, platelets 69, BUN 36, creatinine 1.83, and glucose 166.  EKG revealed a paced rhythm.  Chest x-ray noted stable cardiomegaly without acute abnormality.  TRH called to admit.  Hospital Course:  #1 palpitations/CAD status post CABG/elevated troponin -Patient noted initially to have presented with complaints of intermittent left-sided chest pain described as sharp in nature with no shortness of breath, no nausea vomiting or diaphoresis. - Patient in discussion with his cardiologist denied any chest pain but described more as a tapping sensation/palpitations in his chest. - EKG done showed AV paced rhythm. - Cardiac enzymes cycled with high-sensitivity troponins plateaued with a slight trending down from 28-21. - Patient noted to have received full dose aspirin  on presentation. - 2D echo ordered but not done. - Patient maintained on home regimen statin and Plavix . - Patient seen in consultation by cardiology and pacemaker interrogation was performed. -Cardiology subsequently reprogrammed patient's pacemaker to DDDR mode with physiologic AV delays which led to resolution of patient's palpitations and per cardiology felt episodes were due to second-degree AV block Mobitz type I. -Patient improved clinically did not have any further episodes  of palpitations and will be discharged in stable and improved condition with close outpatient follow-up with his heart failure specialist as scheduled.  2.  Complete heart block status post PPM -Pacemaker implanted  2008. Valeta Gaudier maker generator change performed January 2014 with lead change.  Medtronic Adapta AD DR L1, serial number [facility] ED [id number] [id number]. -Pacemaker noted to be functioning properly when last checked in February 2025. -Pacemaker interrogated during the hospital -Due to patient's presentation with palpitations patient's pacemaker was reprogrammed to DDDR mode with physiologic AV delays which led to resolution of patient's palpitations. -Patient follow-up with cardiology.  3.  HFpEF -Patient noted to be euvolemic throughout the hospitalization. -Chest x-ray done did not reveal any acute abnormalities. -Patient maintained on home regimen of cardiac medications. -Outpatient follow-up with heart failure specialist as previously scheduled.  4.  Paroxysmal atrial fibrillation -Patient noted to have been off anticoagulation due to history of GI bleed but reported being resumed recently on Eliquis . -Patient maintained on home regimen amiodarone  and Eliquis  for anticoagulation. -Patient seen and followed by cardiology during the hospitalization.  5.  CKD stage IIIb -Remains stable.  6.  Hypertension -Controlled during the hospitalization. -Outpatient follow-up.  7.  Diabetes mellitus type 2 -It was noted that patient's Jardiance  was inadvertently stopped recently prior to hospitalization and subsequently resumed during the hospitalization. -Prescriptions sent to patient's pharmacy on discharge. -Patient maintained on sliding scale insulin  during the hospitalization. -Outpatient follow-up with PCP.  8.  Hyperlipidemia -Patient maintained on atorvastatin during the hospitalization. -Outpatient follow-up with cardiology.  9.  Chronic pancytopenia -Remained stable during the hospitalization.  10.  OSA -Patient maintained on CPAP nightly during the hospitalization. -   Procedures: None  Consultations: Cardiology: Dr. Alvis Ba 06/02/2023  Discharge Exam: Vitals:   06/03/23 0751 06/03/23  1129  BP: (!) 140/66 (!) 155/66  Pulse:  72  Resp:  18  Temp: 97.8 F (36.6 C) 97.6 F (36.4 C)  SpO2: 98% 97%    General: NAD Cardiovascular: RRR no murmurs rubs or gallops.  No JVD.  No pitting lower extremity edema. Respiratory: Clear to auscultation bilaterally.  No wheezes, no crackles, no rhonchi.  Fair air movement.  Speaking in full sentences.  Discharge Instructions   Discharge Instructions     Diet - low sodium heart healthy   Complete by: As directed    Increase activity slowly   Complete by: As directed       Allergies as of 06/03/2023   No Known Allergies      Medication List     STOP taking these medications    Aspirin  Low Dose 81 MG tablet Generic drug: aspirin  EC   spironolactone  25 MG tablet Commonly known as: ALDACTONE        TAKE these medications    amiodarone  200 MG tablet Commonly known as: PACERONE  Take 200 mg by mouth daily.   apixaban  2.5 MG Tabs tablet Commonly known as: Eliquis  Take 1 tablet (2.5 mg total) by mouth 2 (two) times daily. What changed:  medication strength how much to take when to take this   baclofen  10 MG tablet Commonly known as: LIORESAL  Take 10 mg by mouth daily.   carvedilol  25 MG tablet Commonly known as: COREG  Take 50 mg by mouth 2 (two) times daily.   clopidogrel  75 MG tablet Commonly known as: PLAVIX  Take 75 mg by mouth daily.   DULoxetine 60 MG capsule Commonly known as: CYMBALTA Take 60 mg by mouth daily.   ezetimibe  10 MG tablet  Commonly known as: ZETIA  Take 10 mg by mouth daily.   glipiZIDE  10 MG tablet Commonly known as: GLUCOTROL  Take 10 mg by mouth 2 (two) times daily.   insulin  lispro 100 UNIT/ML injection Commonly known as: HUMALOG  Inject into the skin 2 (two) times daily after a meal. Per sliding scale   Jardiance  10 MG Tabs tablet Generic drug: empagliflozin  Take 1 tablet (10 mg total) by mouth daily.   torsemide  20 MG tablet Commonly known as: DEMADEX  Take 10 mg by  mouth daily.       No Known Allergies  Follow-up Information     Darlis Eisenmenger, MD Follow up.   Specialty: Cardiology Why: Follow-up as scheduled. Contact information: [address] [place] [address] [phone]                  The results of significant diagnostics from this hospitalization (including imaging, microbiology, ancillary and laboratory) are listed below for reference.    Significant Diagnostic Studies: DG Chest 2 View Result Date: 06/02/2023 CLINICAL DATA:  Chest pain EXAM: CHEST - 2 VIEW COMPARISON:  09/05/2022 FINDINGS: Lungs are clear. No pneumothorax or pleural effusion. Status post coronary artery bypass grafting. Stable cardiomegaly. Left subclavian 3 lead pacemaker is unchanged. One lead appears disconnected, unchanged. Bright vascularity is normal. No acute bone abnormality. IMPRESSION: 1. No active cardiopulmonary disease. 2. Stable cardiomegaly. Electronically Signed   By: Worthy Heads M.D.   On: 06/02/2023 04:08    Microbiology: Recent Results (from the past 240 hours)  MRSA Next Gen by PCR, Nasal     Status: None   Collection Time: 06/02/23 10:04 PM   Specimen: Nasal Mucosa; Nasal Swab  Result Value Ref Range Status   MRSA by PCR Next Gen NOT DETECTED NOT DETECTED Final    Comment: (NOTE) The GeneXpert MRSA Assay (FDA approved for NASAL specimens only), is one component of a comprehensive MRSA colonization surveillance program. It is not intended to diagnose MRSA infection nor to guide or monitor treatment for MRSA infections. Test performance is not FDA approved in patients less than 91 years old. Performed at [facility], [address], [place], [place] [address]      Labs: Basic Metabolic Panel: Recent Labs  Lab 06/02/23 0346 06/03/23 0210  NA 136 135  K 4.5 4.4  CL 105 105  CO2 24 26  GLUCOSE 166* 138*  BUN 36* 30*  CREATININE 1.83* 1.69*  CALCIUM  9.0 8.8*   Liver Function  Tests: Recent Labs  Lab 06/02/23 0349  AST 33  ALT 26  ALKPHOS 60  BILITOT 0.7  PROT 8.3*  ALBUMIN  3.4*   Recent Labs  Lab 06/02/23 0349  LIPASE 50   No results for input(s): "AMMONIA" in the last 168 hours. CBC: Recent Labs  Lab 06/02/23 0346 06/03/23 0210  WBC 5.7 5.0  HGB 12.2* 11.2*  HCT 36.9* 34.0*  MCV 98.1 96.0  PLT 69* 62*   Cardiac Enzymes: No results for input(s): "CKTOTAL", "CKMB", "CKMBINDEX", "TROPONINI" in the last 168 hours. BNP: BNP (last 3 results) Recent Labs    06/02/23 1344  BNP 166.6*    ProBNP (last 3 results) No results for input(s): "PROBNP" in the last 8760 hours.  CBG: Recent Labs  Lab 06/02/23 2204  GLUCAP 149*       Signed:  Hilda Lovings MD.  [facility] 06/03/2023, 11:57 AM

## 2023-06-04 ENCOUNTER — Encounter: Payer: Self-pay | Admitting: Cardiovascular Disease

## 2023-06-04 ENCOUNTER — Telehealth: Payer: Self-pay | Admitting: Family

## 2023-06-04 NOTE — Telephone Encounter (Signed)
 Called to confirm/remind patient of their appointment at the [facility] Failure Clinic on 06/05/23.   Appointment:   [] Confirmed  [x] Left mess   [] No answer/No voice mail  [] VM Full/unable to leave message  [] Phone not in service  Patient reminded to bring all medications and/or complete list.  Confirmed patient has transportation. Gave directions, instructed to utilize valet parking.

## 2023-06-05 ENCOUNTER — Telehealth (HOSPITAL_COMMUNITY): Payer: Self-pay | Admitting: Pharmacist

## 2023-06-05 ENCOUNTER — Telehealth: Payer: Self-pay

## 2023-06-05 ENCOUNTER — Other Ambulatory Visit: Payer: Self-pay

## 2023-06-05 ENCOUNTER — Ambulatory Visit: Attending: Cardiology | Admitting: Cardiology

## 2023-06-05 ENCOUNTER — Encounter: Payer: Self-pay | Admitting: Cardiology

## 2023-06-05 ENCOUNTER — Other Ambulatory Visit (HOSPITAL_COMMUNITY): Payer: Self-pay

## 2023-06-05 VITALS — BP 112/52 | HR 60 | Wt 214.0 lb

## 2023-06-05 DIAGNOSIS — Z7984 Long term (current) use of oral hypoglycemic drugs: Secondary | ICD-10-CM | POA: Diagnosis not present

## 2023-06-05 DIAGNOSIS — I442 Atrioventricular block, complete: Secondary | ICD-10-CM | POA: Insufficient documentation

## 2023-06-05 DIAGNOSIS — Z79899 Other long term (current) drug therapy: Secondary | ICD-10-CM | POA: Insufficient documentation

## 2023-06-05 DIAGNOSIS — I5032 Chronic diastolic (congestive) heart failure: Secondary | ICD-10-CM | POA: Diagnosis present

## 2023-06-05 DIAGNOSIS — Z951 Presence of aortocoronary bypass graft: Secondary | ICD-10-CM | POA: Insufficient documentation

## 2023-06-05 DIAGNOSIS — Z7901 Long term (current) use of anticoagulants: Secondary | ICD-10-CM | POA: Insufficient documentation

## 2023-06-05 DIAGNOSIS — I509 Heart failure, unspecified: Secondary | ICD-10-CM | POA: Diagnosis present

## 2023-06-05 DIAGNOSIS — N184 Chronic kidney disease, stage 4 (severe): Secondary | ICD-10-CM | POA: Diagnosis not present

## 2023-06-05 DIAGNOSIS — Z950 Presence of cardiac pacemaker: Secondary | ICD-10-CM | POA: Diagnosis not present

## 2023-06-05 DIAGNOSIS — I130 Hypertensive heart and chronic kidney disease with heart failure and stage 1 through stage 4 chronic kidney disease, or unspecified chronic kidney disease: Secondary | ICD-10-CM | POA: Insufficient documentation

## 2023-06-05 DIAGNOSIS — Z794 Long term (current) use of insulin: Secondary | ICD-10-CM | POA: Diagnosis not present

## 2023-06-05 DIAGNOSIS — I252 Old myocardial infarction: Secondary | ICD-10-CM | POA: Diagnosis not present

## 2023-06-05 DIAGNOSIS — I480 Paroxysmal atrial fibrillation: Secondary | ICD-10-CM | POA: Insufficient documentation

## 2023-06-05 DIAGNOSIS — D472 Monoclonal gammopathy: Secondary | ICD-10-CM | POA: Insufficient documentation

## 2023-06-05 MED ORDER — EMPAGLIFLOZIN 10 MG PO TABS: 10.0000 mg | ORAL_TABLET | Freq: Every day | ORAL | 3 refills | Status: AC

## 2023-06-05 MED ORDER — APIXABAN 2.5 MG PO TABS: 2.5000 mg | ORAL_TABLET | Freq: Two times a day (BID) | ORAL | 5 refills | Status: DC

## 2023-06-05 MED ORDER — REPATHA SURECLICK 140 MG/ML ~~LOC~~ SOAJ
140.0000 mg | SUBCUTANEOUS | 2 refills | Status: DC
  Filled 2023-06-05 (×2): qty 6, 84d supply, fill #0
  Filled 2023-08-13 (×3): qty 6, 84d supply, fill #1
  Filled 2023-11-02: qty 6, 84d supply, fill #2
  Filled 2023-11-06 (×2): qty 6, 84d supply, fill #0

## 2023-06-05 MED ORDER — APIXABAN 2.5 MG PO TABS
2.5000 mg | ORAL_TABLET | Freq: Two times a day (BID) | ORAL | 3 refills | Status: AC
  Filled 2023-06-05: qty 180, 90d supply, fill #0

## 2023-06-05 NOTE — Progress Notes (Signed)
 PCP: [name], MD Cardiology: Dr. Alvis Ba HF Cardiology: Dr. Mitzie Anda  Chief complaint: CHF  83 y.o. with history of complete heart block s/p pacemaker, CAD s/p CABG and redo, CKD stage 3, GI bleeding, and chronic diastolic CHF was referred to CHF clinic from the [name] clinic. Patient has a long history of CAD. He had his initial CABG in 1990 then redo in 2004. Last cath in 11/20 showed patent sequential LIMA-LAD/D and SVG-PDA, medical management.  He has a Medtronic PPM due to complete heart block, has had this for years.  He has paroxysmal atrial fibrillation, in NSR today. He is not on anticoagulation due to GI bleeding.  He has not had suitable anatomy for Watchman due to surgical clipping with partial closure of the LA appendage.   Patient has struggled recently with diastolic CHF.  He was admitted in 12/22 with CHF exacerbation.  Echo in 12/22 showed EF 55-60%, mild LVH, normal RV, moderate LAE. With diastolic CHF, PAF, biceps tendon rupture, and bilateral carpal tunnel syndrome, cardiac amyloidosis was considered.  PYP scan was not suggestive of TTR cardiac amyloidosis. 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 He has been seen by hematology, bone marrow biopsy in 3/23 showed plasma cell neoplasm with >10% clonal plasma cells, biopsy stained negative for amyloidosis.    In 5/24, he had inguinal hernia repair.   In 10/24, he was admitted to a hospital in [place] with suspected TIA.  BP was markedly high. CT head was negative, CTA head/neck showed no carotid stenosis.  He was unable to get an MRI due to his pacemaker. Echo showed EF 50-55%, moderate LVH, mild MR, mild-moderate TR, moderate RV dysfunction on my review, biatrial enlargement, IVC not dilated.   Follow up 11/24, NYHA III and mildly volume overloaded. Torsemide  increased to 10 mg daily and spiro 12.5 daily added. With recent TIA and partially closed LA appendage from surgical clip,  Plavix  stopped and Eliquis  2.5 mg bid started.   Admitted to [facility] 12/31/22 with AKI 2/2 acute gastroenteritis. Received IVF and abx. CT A/P unremarkable. SCr peaked at 2.54. He was discharged home, weight 198 lbs.  He was admitted overnight in 4/25 primarily due to palpitations.  He was noted to have intermittent v-pacing with type 1 2nd degree AV block.  He was converted over to DDDR pacing with shortened AV delay, committing him to A-V sequential pacing.  He says that this resolved his palpitations.   He returns today for followup of CHF.  No palpitations.  He is somewhat unsure of his medications and we went over them for a while to figure out what he is actually taking. He is NOT taking clopidogrel  and still is not back on Jardiance  yet.  No BRBPR/melena.  He can walk at a slow and steady pace without dyspnea.  He is short of breath with stairs.  No chest pain, no lightheadedness, no further palpitations.   Medtronic device interrogation: 99.7% A-V sequential pacing, no AF.   Labs (1/23): K 4, creatinine 2.37, myeloma panel with IgG monoclonal protein with kappa light chain specificity, urine immunofixation with IgG monoclonal protein. Labs (2/23): K 4.8, creatinine 2.9 => 2.55, LFTs normal, TSH normal, BNP 102 Labs (3/23): hgb 11.1, plts 91 Labs (10/23): hgb 13.7, K 4.1, creatinine 1.81 Labs (10/24): LDL 94, K 4.3, creatinine 1.61, LDL 99 Labs (12/24): K 5.3, creatinine 2.30 Labs (4/25): K 4.4, creatinine 1.69, BNP 167, LFTs normal, TSH normal  PMH: 1. Atrial  fibrillation: Paroxysmal 2. Complete heart block: MDT PPM placed in his 50s.  3. Biceps tendon rupture 4. 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 HTN 7. Hyperlipidemia: h/o statin myopathy, muscle pain with Zetia , PCSK9 has been too expensive.  8. CKD stage 4 9. H/o GI bleeding: Not suitable Watchman candidate due to partial closure of the appendage by surgical clip.  He is not anticoagulated.  10. Chronic  diastolic CHF: Echo (12/22) with EF 55-60%, mild LVH, normal RV, moderate LAE.  - PYP scan (1/23): grade 1, H/CL 0.8 - Echo (10/24): EF 50-55%, moderate LVH, mild MR, mild-moderate TR, moderate RV dysfunction on my review, biatrial enlargement, IVC not dilated.  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 Bone marrow biopsy 3/23 with plasma cell neoplasm with >10% clonal plasma cells, stain for amyloidosis was negative.  Smoldering myeloma.  12. CAD: CABG x 2 in 1990, redo CABG x 2 in 2004.  - LHC (11/20): occluded proximal LAD, 99% dLCx, occluded proximal RCA, LIMA-LAD/diagonal patent, SVG-PDA patent. Distal LCx was small, managed medically.  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  14. TIA (10/24): CT head and CTA neck unremarkable.   SH: Retired Occupational hygienist, also was a Firefighter.  Lives in [name].  Nonsmoker.  Rare ETOH.   Family History  Problem Relation Age of Onset   Other Father        MVA   Cancer Mother    ROS: All systems reviewed and negative except as per HPI.   Current Outpatient Medications  Medication Sig Dispense Refill   amiodarone  (PACERONE ) 200 MG tablet Take 200 mg by mouth daily.     Baclofen  5 MG TABS Take 5 mg by mouth at bedtime.     carvedilol  (COREG ) 25 MG tablet Take 2 tablets (50 mg total) by mouth 2 (two) times daily with a meal. 120 tablet 6   Cyanocobalamin (VITAMIN B-12 PO) Take 1 tablet by mouth daily.     DULoxetine (CYMBALTA) 60 MG capsule Take 60 mg by mouth daily.     empagliflozin  (JARDIANCE ) 10 MG TABS tablet Take 1 tablet (10 mg total) by mouth daily before breakfast. 90 tablet 3   Evolocumab  (REPATHA  SURECLICK) 140 MG/ML SOAJ Inject 140 mg into the skin every 14 (fourteen) days. 6 mL 2   ezetimibe  (ZETIA ) 10 MG tablet Take 10 mg by mouth daily.     ferrous sulfate  325 (65 FE) MG tablet Take 325 mg by mouth daily at 6 (six) AM.     glipiZIDE  (GLUCOTROL ) 10 MG tablet Take 10 mg by mouth 2 (two) times daily.     insulin  lispro  (HUMALOG ) 100 UNIT/ML injection Inject 0-10 Units into the skin 2 (two) times daily as needed for high blood sugar. Per sliding scale.     insulin  lispro (HUMALOG ) 100 UNIT/ML injection Inject into the skin 2 (two) times daily after a meal. Per sliding scale     Multiple Vitamin (MULTIVITAMIN WITH MINERALS) TABS tablet Take 1 tablet by mouth daily.      ONETOUCH ULTRA test strip 1 each by Other route as needed.     torsemide  (DEMADEX ) 20 MG tablet Take 10 mg by mouth daily.     triamcinolone cream (KENALOG) 0.1 % Apply 1 Application topically 2 (two) times daily as needed (rough skin patches).     apixaban  (ELIQUIS ) 2.5 MG TABS tablet Take 1 tablet (2.5 mg total) by mouth 2 (two) times daily. 180 tablet 3   JARDIANCE  10 MG TABS  tablet Take 1 tablet by mouth once daily (Patient not taking: Reported on 06/05/2023) 90 tablet 0   No current facility-administered medications for this visit.   Wt Readings from Last 3 Encounters:  06/05/23 214 lb (97.1 kg)  06/02/23 205 lb (93 kg)  04/02/23 221 lb 1.6 oz (100.3 kg)   BP (!) 112/52 (BP Location: Right Arm, Patient Position: Sitting, Cuff Size: Large)   Pulse 60   Wt 214 lb (97.1 kg)   SpO2 100%   BMI 31.15 kg/m  General: NAD Neck: No JVD, no thyromegaly or thyroid  nodule.  Lungs: Clear to auscultation bilaterally with normal respiratory effort. CV: Nondisplaced PMI.  Heart regular S1/S2, no S3/S4, no murmur.  No peripheral edema.  No carotid bruit.  Normal pedal pulses.  Abdomen: Soft, nontender, no hepatosplenomegaly, no distention.  Skin: Intact without lesions or rashes.  Neurologic: Alert and oriented x 3.  Psych: Normal affect. Extremities: No clubbing or cyanosis.  HEENT: Normal.   Assessment/Plan: 1. Chronic diastolic CHF: Echo 12/22 showed EF 55-60%, mild LVH, normal RV, moderate LAE.  There was concern for cardiac amyloidosis with bilateral carpal tunnel syndrome and biceps tendon rupture.  PYP scan was not suggestive of TTR cardiac  amyloidosis, but myeloma panel and urine immunofixation were suggestive of monoclonal gammopathy. Bone marrow biopsy showed plasma cell neoplasm with >10% clonal plasma cells, biopsy staining was negative for amyloidosis.  Given lack of rapid progression of cardiac disease, suspect he does not have cardiac AL amyloidosis.  He is unable to get cardiac MRI, PPM is not compatible.  Echo in 9/24 showed EF 50-55%, moderate LVH, mild MR, mild-moderate TR, moderate RV dysfunction on my review, biatrial enlargement, IVC not dilated.  NYHA class II-III symptoms, stable.  He is not volume overloaded on exam.  Of note, he is now 99% v-paced.  - He is off spironolactone  due to AKI.  - Continue torsemide  10 mg daily.  - Restart Jardiance  10 mg daily.   - I will obtain a repeat echo at followup in 6 wks, need to make sure that his LV systolic function does not worsen with RV pacing.  If it does, will need to consider CRT upgrade.  2. Monoclonal gammopathy: Bone marrow biopsy in 3/23 with plasma cell neoplasm with >10% clonal plasma cells, staining was negative for amyloidosis.  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  3. CAD: S/p CABG and redo.  Cath in 11/20 showed patent grafts.  He denies chest pain.  - He is no longer taking Plavix  or ASA given Eliquis  use.  - He has been unable to take statins due to myalgias, currently on Zetia  only.  Goal LDL < 55, LDL has been above goal.  I will have our pharmacist work on getting Repatha  for him.  4. HTN: BP not elevated. 5. Atrial fibrillation: Paroxysmal.. No recent AF on device interrogation.  He has a history of GI bleeding but so far is tolerating Eliquis .  - Continue amiodarone  200 daily. Recent LFTs and TSH normal. He will need a regular eye exam.  - He is not a Watchman candidate, he has a partially closed LA appendage from surgical clip.  - Continue Eliquis  2.5 mg bid (lower dose with age 34, creatinine > 1.5). If he has recurrent GI bleeding, may have to stop again.   6. H/o  complete heart block: He has a MDT PPM.  7. CKD: Stage 4. Recent admit with AKI after GI illness. Most recent creatinine was lower at 1.69.  -  He sees nephrology.   Followup 6 wks with echo.  I spent 32 minutes reviewing records, interviewing/examining patient, and managing orders.   [name]  06/05/2023

## 2023-06-05 NOTE — Telephone Encounter (Signed)
 Spoke with patient regarding Eliquis  cost. Copay should be $0, however, it can not be filled again until 06/26/23.

## 2023-06-05 NOTE — Telephone Encounter (Signed)
 Advanced Heart Failure Patient Advocate Encounter  Test billing for this patient returned the following results:  Eliquis  $0 for 90 days  Xarelto  DUR reject - unable to verify copay Repatha  $0 for 28 days PA approved until 02/06/2024 Praluent not covered   Kennis Peacock, CPhT Rx Patient Advocate Phone: [phone]

## 2023-06-05 NOTE — Patient Instructions (Addendum)
 Medication Changes:  STOP Plavix   START Eliquis  2.5mg  two times daily  START Repatha     Testing/Procedures:  Your physician has requested that you have an echocardiogram. Echocardiography is a painless test that uses sound waves to create images of your heart. It provides your doctor with information about the size and shape of your heart and how well your heart's chambers and valves are working. This procedure takes approximately one hour. There are no restrictions for this procedure. Please do NOT wear cologne, perfume, aftershave, or lotions (deodorant is allowed). Please arrive 15 minutes prior to your appointment time.  Please note: We ask at that you not bring children with you during ultrasound (echo/ vascular) testing. Due to room size and safety concerns, children are not allowed in the ultrasound rooms during exams. Our front office staff cannot provide observation of children in our lobby area while testing is being conducted. An adult accompanying a patient to their appointment will only be allowed in the ultrasound room at the discretion of the ultrasound technician under special circumstances. We apologize for any inconvenience.    Follow-Up in: Please follow up with the Advanced Heart Failure in [facility] in 6 weeks with an echocardiogram. We currently do not have that schedule. Please call in June in order to schedule your appointment for July.  At the Advanced Heart Failure Clinic, you and your health needs are our priority. We have a designated team specialized in the treatment of Heart Failure. This Care Team includes your primary Heart Failure Specialized Cardiologist (physician), Advanced Practice Providers (APPs- Physician Assistants and Nurse Practitioners), and Pharmacist who all work together to provide you with the care you need, when you need it.   You may see any of the following providers on your designated Care Team at your next follow up:  Dr. Jules Oar Dr. Peder Bourdon Dr. Alwin Baars Dr. Judyth Nunnery Shawnee Dellen, FNP Bevely Brush, RPH-CPP  Please be sure to bring in all your medications bottles to every appointment.   Need to Contact Us :  If you have any questions or concerns before your next appointment please send us  a message through [place] or call our office at [phone].    TO LEAVE A MESSAGE FOR THE NURSE SELECT OPTION 2, PLEASE LEAVE A MESSAGE INCLUDING: YOUR NAME DATE OF BIRTH CALL BACK NUMBER REASON FOR CALL**this is important as we prioritize the call backs  YOU WILL RECEIVE A CALL BACK THE SAME DAY AS LONG AS YOU CALL BEFORE 4:00 PM

## 2023-06-06 ENCOUNTER — Other Ambulatory Visit (HOSPITAL_COMMUNITY): Payer: Self-pay

## 2023-06-08 ENCOUNTER — Other Ambulatory Visit (HOSPITAL_COMMUNITY): Payer: Self-pay

## 2023-06-08 ENCOUNTER — Telehealth (HOSPITAL_COMMUNITY): Payer: Self-pay

## 2023-06-08 NOTE — Telephone Encounter (Signed)
 Advanced Heart Failure Patient Advocate Encounter  The patient was approved for a [employer] grant that will help cover the cost of Jardiance , Carvedilol .  Total amount awarded, $4,500.  Effective: 05/09/2023 - 05/07/2024.  BIN [id number] PCN [id number] Group [id number] ID [id number]  Pharmacy provided with approval and processing information. Confirmed $0 copay for Jardiance . Patient informed via phone.  Kennis Peacock, CPhT Rx Patient Advocate Phone: [phone]

## 2023-06-27 ENCOUNTER — Encounter: Payer: Self-pay | Admitting: Primary Care

## 2023-06-27 ENCOUNTER — Ambulatory Visit: Admitting: Primary Care

## 2023-06-27 VITALS — BP 148/82 | HR 75 | Ht 69.5 in | Wt 209.8 lb

## 2023-06-27 DIAGNOSIS — G4733 Obstructive sleep apnea (adult) (pediatric): Secondary | ICD-10-CM | POA: Diagnosis not present

## 2023-06-27 NOTE — Progress Notes (Signed)
 @Patient  ID: Alan [name], male    DOB: 1940/08/05, 83 y.o.   MRN: [id number]  Chief Complaint  Patient presents with   Obstructive Sleep Apnea    Inspire inquiry, pt states they are tired of the CPAP machine and reports frustration with it moving during the night    Referring provider: [name], MD  HPI: 83 year old male never smoker followed for OSA, history UPPP. Complicated by Post polio, AFib/ pacemaker, CAD/CABG,  DM 2, HBP, gout,  DDD,      PCP Dr Sophronia NPSG 05/30/97- AHI 20/ hr, desaturation to 78%, body weight 230 lbs Office Spirometry-02/26/2018- Mild Restriction of exhaled volume.  FVC 2.9/69%, FEV1 2.4/81%, ratio 1.84, FEF 25-75% 3.1/145%. -----------------------------------------------------------------------------   06/27/2023- Interim hx  Discussed the use of AI scribe software for clinical note transcription with the patient, who gave verbal consent to proceed.  History of Present Illness   [name] is an 83 year old male with sleep apnea who presents with difficulties tolerating CPAP therapy. He was referred by Dr. Neysa for evaluation of his sleep apnea and consideration of Inspire therapy.  He experiences significant difficulties with CPAP therapy for sleep apnea. The mask causes redness and pain on the top of his nose and shifts during the night, leading to breathing difficulties. He finds the CPAP frustrating and hard to tolerate.  He has a history of atrial fibrillation and is currently taking amiodarone  and a blood thinner. He monitors his heart rate and blood pressure at home and has not noticed any abnormalities. About a month ago, he was hospitalized due to pacemaker issues, and a cardiologist spent several hours adjusting it. He describes a recent episode of feeling clammy and disoriented after a shower, but these symptoms resolved on his own.  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 He enjoys  watching planes and uses an app to track flights globally. He occasionally visits the airport to watch planes and sometimes goes flying with friends to relive his past experiences.      Airview download 05/27/23-06/25/23 Usage days 30/30 days Average usage 8 hours 29 mins Pressure 12cm h20 Airleaks 45.9L/min (median); 103L/min (95%) AHI 3.7   No Known Allergies  Immunization History  Administered Date(s) Administered   Fluad Quad(high Dose 65+) 11/08/2015, 11/07/2016, 11/01/2018, 12/12/2019, 11/15/2020, 11/30/2021   Fluad Trivalent(High Dose 65+) 11/08/2011, 11/08/2015, 11/07/2016, 11/20/2022   Influenza Split 12/08/2010, 11/08/2011, 11/15/2012   Influenza Whole 11/06/2009, 11/07/2011   Influenza, High Dose Seasonal PF 11/15/2012, 11/28/2013, 11/08/2015, 11/07/2016, 11/29/2017   Influenza, Seasonal, Injecte, Preservative Fre 12/14/2014   Influenza,inj,Quad PF,6+ Mos 12/14/2014   Influenza,trivalent, recombinat, inj, PF 12/08/2010, 11/08/2011, 11/15/2012   PFIZER(Purple Top)SARS-COV-2 Vaccination 03/13/2019, 04/03/2019, 09/24/2019   Pneumococcal Conjugate-13 12/09/2012   Pneumococcal Polysaccharide-23 01/25/2015   Tdap 09/30/2010, 12/02/2020, 09/05/2022   Zoster Recombinant(Shingrix) 07/19/2017    Past Medical History:  Diagnosis Date   Anticoagulant long-term use    Arthritis    Arthropathy of left shoulder    Atypical mole    Biceps tendonitis, left    Bursitis of left shoulder    Cardiac pacemaker in situ    first insertion 2008;  genertor and new lead change 03-08-2012;   [employer]   CHB (complete heart block) (HCC)    s/p  PPM 2008   Chronic back pain    CKD (chronic kidney disease), stage III (HCC)    Coronary artery disease cardiologist--- dr francyne  hx  CABG x2  1990 and re-do 2004;  multiple PCI to RCA 1990 to 2004;    cardiac cath 2012  occluded LAD and RCA with patent grafts   Difficult intubation     Dr. Francyne said that it was difficulty to get the tube  in.   History of coronary angioplasty    multiple PCI to RCA ,  1990 to 2004   History of GI bleed followed by GI-- [name] @ [facility] in [place]   01/ 2020  upper and lower GI bleed ;  s/p  EGD with cautery of jejunal bleed and blood transfusion's   History of kidney stones    Hx of echocardiogram 06/29/2008   EF 45-50%    Hypertension    Ischemic cardiomyopathy    previously reported , ef 40-45%;  2010--- ef 45-50%;   last echo 2017 55-60%   Mixed hyperlipidemia    Nocturia    OSA on CPAP    cpap, 12, last sleep study Jan2013, sees Dr. 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dx polio age 6;   09-30-2018  per pt occasional gait issues and occasion fall   Presence of permanent cardiac pacemaker    Pseudoarthrosis of lumbar spine    Rotator cuff tear, left    S/P CABG x 2    1990 x2  and 2004  x2   Sinus node dysfunction (HCC)    2015--- sinus node arrest  with intact AV conduction   Squamous cell carcinoma of skin 06/14/2021   Mid Parietal Scalp (in situ) (tx p bx)   Type 2 diabetes mellitus (HCC)    followed by pcp    Tobacco History: Social History   Tobacco Use  Smoking Status Never  Smokeless Tobacco Never   Counseling given: Not Answered   Outpatient Medications Prior to Visit  Medication Sig Dispense Refill   amiodarone  (PACERONE ) 200 MG tablet Take 200 mg by mouth daily.     apixaban  (ELIQUIS ) 2.5 MG TABS tablet Take 1 tablet (2.5 mg total) by mouth 2 (two) times daily. 180 tablet 3   Baclofen  5 MG TABS Take 5 mg by mouth at bedtime. (Patient not taking: Reported on 06/27/2023)     carvedilol  (COREG ) 25 MG tablet Take 2 tablets (50 mg total) by mouth 2 (two) times daily with a meal. 120 tablet 6   Cyanocobalamin (VITAMIN B-12 PO) Take 1 tablet by mouth daily.     DULoxetine (CYMBALTA) 60 MG capsule Take 60 mg by mouth daily. (Patient not taking: Reported on  06/27/2023)     empagliflozin  (JARDIANCE ) 10 MG TABS tablet Take 1 tablet (10 mg total) by mouth daily before breakfast. 90 tablet 3   Evolocumab  (REPATHA  SURECLICK) 140 MG/ML SOAJ Inject 140 mg into the skin every 14 (fourteen) days. 6 mL 2   ezetimibe  (ZETIA ) 10 MG tablet Take 10 mg by mouth daily.     ferrous sulfate  325 (65 FE) MG tablet Take 325 mg by mouth daily at 6 (six) AM.     glipiZIDE  (GLUCOTROL ) 10 MG tablet Take 10 mg by mouth 2 (two) times daily.     insulin  lispro (HUMALOG ) 100 UNIT/ML injection Inject 0-10 Units into the skin 2 (two) times daily as needed for high blood sugar. Per sliding scale.     insulin  lispro (HUMALOG ) 100 UNIT/ML injection Inject  into the skin 2 (two) times daily after a meal. Per sliding scale     JARDIANCE  10 MG TABS tablet Take 1 tablet by mouth once daily (Patient not taking: Reported on 06/05/2023) 90 tablet 0   Multiple Vitamin (MULTIVITAMIN WITH MINERALS) TABS tablet Take 1 tablet by mouth daily.      ONETOUCH ULTRA test strip 1 each by Other route as needed.     torsemide  (DEMADEX ) 20 MG tablet Take 10 mg by mouth daily.     triamcinolone cream (KENALOG) 0.1 % Apply 1 Application topically 2 (two) times daily as needed (rough skin patches).     No facility-administered medications prior to visit.   Review of Systems  Review of Systems  Constitutional: Negative.   HENT: Negative.    Respiratory: Negative.     Physical Exam  BP (!) 148/82 (BP Location: Left Arm, Patient Position: Sitting, Cuff Size: Normal)   Pulse 75   Ht 5' 9.5 (1.765 m)   Wt 209 lb 12.8 oz (95.2 kg)   SpO2 99%   BMI 30.54 kg/m  Physical Exam Constitutional:      General: He is not in acute distress.    Appearance: Normal appearance.  Cardiovascular:     Rate and Rhythm: Normal rate and regular rhythm.     Comments: RRR Pulmonary:     Effort: Pulmonary effort is normal.     Breath sounds: Normal breath sounds. No wheezing, rhonchi or rales.  Skin:    General:  Skin is warm and dry.  Neurological:     General: No focal deficit present.     Mental Status: He is alert and oriented to person, place, and time. Mental status is at baseline.  Psychiatric:        Mood and Affect: Mood normal.        Behavior: Behavior normal.        Thought Content: Thought content normal.        Judgment: Judgment normal.      Lab Results:  CBC    Component Value Date/Time   WBC 5.0 06/03/2023 0210   RBC 3.54 (L) 06/03/2023 0210   HGB 11.2 (L) 06/03/2023 0210   HGB 10.7 (L) 04/02/2023 1032   HGB 11.6 (L) 01/13/2019 1031   HCT 34.0 (L) 06/03/2023 0210   HCT 34.1 (L) 01/13/2019 1031   PLT 62 (L) 06/03/2023 0210   PLT 80 (L) 04/02/2023 1032   PLT 166 01/13/2019 1031   MCV 96.0 06/03/2023 0210   MCV 94 01/13/2019 1031   MCV 92 05/02/2012 1055   MCH 31.6 06/03/2023 0210   MCHC 32.9 06/03/2023 0210   RDW 12.9 06/03/2023 0210   RDW 12.3 01/13/2019 1031   RDW 13.5 05/02/2012 1055   LYMPHSABS 1.3 04/02/2023 1032   LYMPHSABS 1.2 05/02/2012 1055   MONOABS 0.6 04/02/2023 1032   MONOABS 0.6 05/02/2012 1055   EOSABS 0.2 04/02/2023 1032   EOSABS 0.1 05/02/2012 1055   BASOSABS 0.0 04/02/2023 1032   BASOSABS 0.1 05/02/2012 1055    BMET    Component Value Date/Time   NA 135 06/03/2023 0210   NA 134 01/11/2023 0918   NA 136 05/02/2012 1055   K 4.4 06/03/2023 0210   K 4.0 05/02/2012 1055   CL 105 06/03/2023 0210   CL 100 05/02/2012 1055   CO2 26 06/03/2023 0210   CO2 30 05/02/2012 1055   GLUCOSE 138 (H) 06/03/2023 0210   GLUCOSE 100 (H) 05/02/2012 1055  BUN 30 (H) 06/03/2023 0210   BUN 59 (H) 01/11/2023 0918   BUN 38 (H) 05/02/2012 1055   CREATININE 1.69 (H) 06/03/2023 0210   CREATININE 2.01 (H) 04/02/2023 1032   CREATININE 1.53 (H) 05/02/2012 1055   CALCIUM  8.8 (L) 06/03/2023 0210   CALCIUM  9.1 05/02/2012 1055   GFRNONAA 40 (L) 06/03/2023 0210   GFRNONAA 33 (L) 04/02/2023 1032   GFRAA 38 (L) 04/07/2019 0920    BNP    Component Value  Date/Time   BNP 166.6 (H) 06/02/2023 1344    ProBNP No results found for: PROBNP  Imaging: DG Chest 2 View Result Date: 06/02/2023 CLINICAL DATA:  Chest pain EXAM: CHEST - 2 VIEW COMPARISON:  09/05/2022 FINDINGS: Lungs are clear. No pneumothorax or pleural effusion. Status post coronary artery bypass grafting. Stable cardiomegaly. Left subclavian 3 lead pacemaker is unchanged. One lead appears disconnected, unchanged. Bright vascularity is normal. No acute bone abnormality. IMPRESSION: 1. No active cardiopulmonary disease. 2. Stable cardiomegaly. Electronically Signed   By: Dorethia Molt M.D.   On: 06/02/2023 04:08     Assessment & Plan:   1. OSA (obstructive sleep apnea) (Primary) - Home sleep test; Future - Ambulatory referral to ENT     Obstructive sleep apnea Obstructive sleep apnea with CPAP intolerance due to discomfort and mask issues. He is interested in Brent therapy, a hypoglossal nerve stimulator. He meets the qualifications for Inspire, including a BMI of 30 and CPAP intolerance. He is capable of using the remote for the device. Inspire implantation is an outpatient procedure by an ENT specialist - Order home sleep study to assess current severity of sleep apnea - Refer to ENT for sleep endoscopy to evaluate airway closure during sleep - Provide pamphlet about Inspire therapy for further information - Continue CPAP use as much as possible until further evaluation  Atrial fibrillation Atrial fibrillation managed with amiodarone  and anticoagulation. He reports a recent episode of feeling clammy and disoriented, resolving spontaneously. No abnormal heart rate or blood pressure readings at home. Recent hospitalization for pacemaker issues, with potential need for a different pacemaker if problems persist. Current examination reveals regular sinus rhythm, no signs of atrial fibrillation. - Advise to contact cardiologist if symptoms recur   Almarie LELON Ferrari,  NP 06/27/2023

## 2023-06-27 NOTE — Patient Instructions (Addendum)
-  OBSTRUCTIVE SLEEP APNEA: Obstructive sleep apnea is a condition where your airway becomes blocked during sleep, causing breathing interruptions. You are having trouble with CPAP therapy due to discomfort and mask issues. We discussed the possibility of Inspire therapy, which is a device that stimulates a nerve to keep your airway open. You qualify for this treatment and will need a home sleep study and a referral to an ENT specialist for further evaluation. Please continue using your CPAP as much as possible until we have more information.  -ATRIAL FIBRILLATION: Atrial fibrillation is an irregular and often rapid heart rate that can increase your risk of strokes and other heart-related complications. Your condition is currently managed with medication, and you have not noticed any abnormal heart rate or blood pressure readings at home. You recently experienced a brief episode of feeling clammy and disoriented, which resolved on its own. If these symptoms happen again, please contact your cardiologist.  INSTRUCTIONS:  1. Complete the home sleep study to assess the current severity of your sleep apnea. 2. Follow up with an ENT specialist for a sleep endoscopy to evaluate your airway during sleep. 3. Continue using your CPAP machine as much as possible until further evaluation. 4. Contact your cardiologist if you experience any more episodes of feeling clammy or disoriented.  Follow-up: 6 months with Dr. Linder Revere

## 2023-06-29 ENCOUNTER — Telehealth: Payer: Self-pay | Admitting: Cardiovascular Disease

## 2023-06-29 NOTE — Telephone Encounter (Signed)
 Called and spoke to patient who states since talking to me earlier, he has felt wonderful. He reports no issues with chest pressure. Advised him that device transmission was fine and he likely should go to ED. Patient states he doesn't want to at this time, but as soon as he feels something of this sort again, he will. Reiterated that he may need to go, but the decision is up to him. Appreciative of call back.

## 2023-06-29 NOTE — Telephone Encounter (Signed)
 Call taken in triage due to pt reporting chest pressure. He says that Dr Alvis Ba explained and did something to his pacemaker a few weeks ago. Was told at that time if this happened again, he may need a new wire placed/new pacemaker. Patient states he's trying to get word to him that something is going on. Denies diaphoresis, SOB, just states "my chest doesn't feel quite right, a little bit of pressure, just something's going on." Says this has been going on for a few hours and started when he was riding the tractor. Has no vitals to report. Reprogramming his pacemaker to DDDR mode with physiological AV delays has led to resolution of his palpitations, which were due to episodes of second-degree AV block Mobitz type I. Due to age, high-dose carvedilol  and amiodarone  his AV conduction is extremely lengthy, PR interval over 450 ms. While it is possible that he will lose a little bit of left ventricular efficiency due to pacing induced dyssynchrony, it is possible that this will be compensated by better AV synchrony. Discussed the fact that if there is any evidence of worsening heart failure after these pacing changes, we may decide to adjust down his dose of beta-blocker, even change his pacemaker to a CRT device. He has a follow-up appointment with his heart failure specialist later this week.

## 2023-06-29 NOTE — Telephone Encounter (Signed)
 Spoke with Alan [name] (DOD) who requested that device clinic perform remote transmission, verify device and rhythm and if all looks good, he should go to ED. Likely, he will need to go to ED anyway, but we can rule an arrhythmia in the meantime and solidify that it's coronary/cardiac in nature and not device related. Howell Macintosh made aware to look for remote transmission. Patient is sending in now and states he knows how to. Will follow-up once its received.

## 2023-06-29 NOTE — Telephone Encounter (Signed)
 Pt c/o of Chest Pain: STAT if active (IN THIS MOMENT) CP, including tightness, pressure, jaw pain, shoulder/upper arm/back pain, SOB, nausea, and vomiting.  1. Are you having CP right now (tightness, pressure, or discomfort)? Yes 2. Are you experiencing any other symptoms (ex. SOB, nausea, vomiting, sweating)? No   3. How long have you been experiencing CP? Couples hours   4. Is your CP continuous or coming and going? With exertion   5. Have you taken Nitroglycerin ? No   6. If CP returns before callback, please consider calling 911. ?

## 2023-06-29 NOTE — Telephone Encounter (Signed)
 Transmission received.  Device function WNL.  Presenting AP/VP 60.  Pt with recent reprogramming (end of April 2025) to DDDR 60 with SAV 200 ms and PAV 220 ms.    Prior to this change Pt was programmed AAIR-DDDR 60 and V paced 26.5%.    Since programmed to DDDR- Pt now AP/VP 99.6%.  No episodes noted.   Pt has close follow up with Heart Failure clinic.  Anticipate echocardiogram with heart failure follow up in July 2025 to reevaluate cardiac pumping function.  Pt may also benefit from decreasing the sensitivity of his rate response d/t history of CAD s/p CABG with redo.

## 2023-07-03 ENCOUNTER — Ambulatory Visit (INDEPENDENT_AMBULATORY_CARE_PROVIDER_SITE_OTHER): Payer: Medicare HMO

## 2023-07-03 DIAGNOSIS — I495 Sick sinus syndrome: Secondary | ICD-10-CM | POA: Diagnosis not present

## 2023-07-03 DIAGNOSIS — I442 Atrioventricular block, complete: Secondary | ICD-10-CM

## 2023-07-05 LAB — CUP PACEART REMOTE DEVICE CHECK
Battery Impedance: 1916 Ohm
Battery Remaining Longevity: 32 mo
Battery Voltage: 2.75 V
Brady Statistic AP VP Percent: 100 %
Brady Statistic AP VS Percent: 0 %
Brady Statistic AS VP Percent: 0 %
Brady Statistic AS VS Percent: 0 %
Date Time Interrogation Session: 20250527100458
Implantable Lead Connection Status: 753985
Implantable Lead Connection Status: 753985
Implantable Lead Implant Date: 20080208
Implantable Lead Implant Date: 20140130
Implantable Lead Location: 753859
Implantable Lead Location: 753860
Implantable Lead Model: 5076
Implantable Lead Model: 5076
Implantable Pulse Generator Implant Date: 20140130
Lead Channel Impedance Value: 464 Ohm
Lead Channel Impedance Value: 605 Ohm
Lead Channel Pacing Threshold Amplitude: 0.875 V
Lead Channel Pacing Threshold Amplitude: 1.125 V
Lead Channel Pacing Threshold Pulse Width: 0.4 ms
Lead Channel Pacing Threshold Pulse Width: 0.4 ms
Lead Channel Setting Pacing Amplitude: 2 V
Lead Channel Setting Pacing Amplitude: 2.5 V
Lead Channel Setting Pacing Pulse Width: 0.4 ms
Lead Channel Setting Sensing Sensitivity: 2 mV
Zone Setting Status: 755011
Zone Setting Status: 755011

## 2023-07-10 ENCOUNTER — Encounter

## 2023-07-10 DIAGNOSIS — G4733 Obstructive sleep apnea (adult) (pediatric): Secondary | ICD-10-CM

## 2023-07-11 ENCOUNTER — Ambulatory Visit: Payer: Self-pay | Admitting: Cardiovascular Disease

## 2023-07-27 ENCOUNTER — Telehealth: Payer: Self-pay | Admitting: Pulmonary Disease

## 2023-07-27 DIAGNOSIS — G4733 Obstructive sleep apnea (adult) (pediatric): Secondary | ICD-10-CM

## 2023-07-27 NOTE — Telephone Encounter (Signed)
 Call patient  Sleep study result  Date of study: 07/10/2023  Impression: Suboptimal study for adequate assessment for sleep disordered breathing, very limited oximetry data  Recommendation: Consider repeating home sleep test  An in-lab study should be considered if there is significant concern for sleep disordered breathing

## 2023-07-30 NOTE — Telephone Encounter (Signed)
 Suboptimal study, please order PSG -in lab sleep study  Hx sleep apnea, interested in inspire

## 2023-07-31 NOTE — Telephone Encounter (Signed)
 ATC x1 LVM for patient to call our office back . I placed the order for the PSG in lab .

## 2023-08-09 ENCOUNTER — Telehealth: Payer: Self-pay

## 2023-08-09 NOTE — Telephone Encounter (Signed)
 I called and spoke to Alan [name]. Alan [name] informed of Dr Neda and Landry Ferrari, NP note. Alan [name] verbalized understanding and I verified with Alan [name] that the in lab sleep study was already scheduled. I sent Alan [name] a Mychart message to make sure he had the information. Alan [name] verbalized understanding. NFN

## 2023-08-09 NOTE — Telephone Encounter (Signed)
 Copied from CRM [id number]. Topic: Clinical - Lab/Test Results >> Aug 09, 2023 10:59 AM Leila C wrote: Reason for CRM:  [id number] states is waiting for Inspire study results it's about a month now and want results. Per CAL, CMA is unable, send a message. Please call back. >> Aug 09, 2023 11:15 AM Isabell A wrote: Patient returning missed call - advised someone will call him back.    ATC x1 LVM for patient to call our office back. Looks like patient needs to have a inlab sleep study and that has already been scheduled 08/27/2023

## 2023-08-13 ENCOUNTER — Other Ambulatory Visit (HOSPITAL_COMMUNITY): Payer: Self-pay

## 2023-08-13 ENCOUNTER — Other Ambulatory Visit: Payer: Self-pay

## 2023-08-14 ENCOUNTER — Other Ambulatory Visit: Payer: Self-pay

## 2023-08-20 NOTE — Addendum Note (Signed)
 Addended by: TAWNI DRILLING D on: 08/20/2023 09:50 AM   Modules accepted: Orders

## 2023-08-20 NOTE — Progress Notes (Signed)
 Remote pacemaker transmission.

## 2023-08-27 ENCOUNTER — Ambulatory Visit (HOSPITAL_BASED_OUTPATIENT_CLINIC_OR_DEPARTMENT_OTHER): Attending: Primary Care | Admitting: Internal Medicine

## 2023-08-27 DIAGNOSIS — G4733 Obstructive sleep apnea (adult) (pediatric): Secondary | ICD-10-CM | POA: Diagnosis present

## 2023-08-30 ENCOUNTER — Ambulatory Visit: Attending: Cardiovascular Disease | Admitting: Cardiovascular Disease

## 2023-08-30 ENCOUNTER — Encounter: Payer: Self-pay | Admitting: Cardiovascular Disease

## 2023-08-30 VITALS — BP 122/64 | HR 85 | Ht 70.0 in | Wt 208.0 lb

## 2023-08-30 DIAGNOSIS — C9000 Multiple myeloma not having achieved remission: Secondary | ICD-10-CM

## 2023-08-30 DIAGNOSIS — E7800 Pure hypercholesterolemia, unspecified: Secondary | ICD-10-CM

## 2023-08-30 DIAGNOSIS — I10 Essential (primary) hypertension: Secondary | ICD-10-CM

## 2023-08-30 DIAGNOSIS — Z950 Presence of cardiac pacemaker: Secondary | ICD-10-CM

## 2023-08-30 DIAGNOSIS — I4729 Other ventricular tachycardia: Secondary | ICD-10-CM

## 2023-08-30 DIAGNOSIS — E1122 Type 2 diabetes mellitus with diabetic chronic kidney disease: Secondary | ICD-10-CM

## 2023-08-30 DIAGNOSIS — D696 Thrombocytopenia, unspecified: Secondary | ICD-10-CM

## 2023-08-30 DIAGNOSIS — Z79899 Other long term (current) drug therapy: Secondary | ICD-10-CM

## 2023-08-30 DIAGNOSIS — I25708 Atherosclerosis of coronary artery bypass graft(s), unspecified, with other forms of angina pectoris: Secondary | ICD-10-CM

## 2023-08-30 DIAGNOSIS — I480 Paroxysmal atrial fibrillation: Secondary | ICD-10-CM | POA: Diagnosis not present

## 2023-08-30 DIAGNOSIS — G720 Drug-induced myopathy: Secondary | ICD-10-CM

## 2023-08-30 DIAGNOSIS — T466X5D Adverse effect of antihyperlipidemic and antiarteriosclerotic drugs, subsequent encounter: Secondary | ICD-10-CM

## 2023-08-30 DIAGNOSIS — I5032 Chronic diastolic (congestive) heart failure: Secondary | ICD-10-CM

## 2023-08-30 DIAGNOSIS — D6869 Other thrombophilia: Secondary | ICD-10-CM | POA: Diagnosis not present

## 2023-08-30 DIAGNOSIS — N184 Chronic kidney disease, stage 4 (severe): Secondary | ICD-10-CM

## 2023-08-30 DIAGNOSIS — I442 Atrioventricular block, complete: Secondary | ICD-10-CM

## 2023-08-30 DIAGNOSIS — Z794 Long term (current) use of insulin: Secondary | ICD-10-CM

## 2023-08-30 DIAGNOSIS — Z5181 Encounter for therapeutic drug level monitoring: Secondary | ICD-10-CM

## 2023-08-30 DIAGNOSIS — I495 Sick sinus syndrome: Secondary | ICD-10-CM

## 2023-08-30 NOTE — Patient Instructions (Signed)
 Medication Instructions:  No changes *If you need a refill on your cardiac medications before your next appointment, please call your pharmacy*  Lab Work: None ordered If you have labs (blood work) drawn today and your tests are completely normal, you will receive your results only by: MyChart Message (if you have MyChart) OR A paper copy in the mail If you have any lab test that is abnormal or we need to change your treatment, we will call you to review the results.  Testing/Procedures: None ordered  Follow-Up: At [facility], you and your health needs are our priority.  As part of our continuing mission to provide you with exceptional heart care, our providers are all part of one team.  This team includes your primary Cardiologist (physician) and Advanced Practice Providers or APPs (Physician Assistants and Nurse Practitioners) who all work together to provide you with the care you need, when you need it.  Your next appointment:   6 month(s)  Provider:   Dr Francyne  We recommend signing up for the patient portal called MyChart.  Sign up information is provided on this After Visit Summary.  MyChart is used to connect with patients for Virtual Visits (Telemedicine).  Patients are able to view lab/test results, encounter notes, upcoming appointments, etc.  Non-urgent messages can be sent to your provider as well.   To learn more about what you can do with MyChart, go to [email or web address].

## 2023-08-31 ENCOUNTER — Other Ambulatory Visit: Payer: Self-pay

## 2023-08-31 DIAGNOSIS — D472 Monoclonal gammopathy: Secondary | ICD-10-CM

## 2023-09-02 NOTE — Procedures (Signed)
 [facility] [address] [place], [place] [address] Tel: [phone]   Fax: [phone]  Polysomnography Interpretation  Patient Name:  Alan [name], Alan [name] Date:  08/27/2023 Referring Physician:  ALMARIE FERRARI [phone]) %%startinterp%% Indications for Polysomnography The patient is an 83 year old Male who is 5' 10 and weighs 200.0 lbs. His BMI equals 29.0.  A full night polysomnogram was performed to evaluate for -.  No medications were reported taken during the night.  No Data.   Polysomnogram Data A full night polysomnogram recorded the standard physiologic parameters including EEG, EOG, EMG, EKG, nasal and oral airflow.  Respiratory parameters of chest and abdominal movements were recorded with Respiratory Inductance Plethysmography belts.  Oxygen saturation was recorded by pulse oximetry.   Sleep Architecture The total recording time of the polysomnogram was 399.6 minutes.  The total sleep time was 256.5 minutes.  The patient spent 25.1% of total sleep time in Stage N1, 50.7% in Stage N2, 0.0% in Stages N3, and 24.2% in REM.  Sleep latency was 5.3 minutes.  REM latency was 191.0 minutes.  Sleep Efficiency was 64.2%.  Wake after Sleep Onset time was 137.5 minutes.  Respiratory Events The polysomnogram revealed a presence of 3 obstructive, - central, and - mixed apneas resulting in an Apnea index of 0.7 events per hour.  There were 103 hypopneas (>=3% desaturation and/or arousal) resulting in an Apnea\Hypopnea Index (AHI >=3% desaturation and/or arousal) of 24.8 events per hour.  There were 48 hypopneas (>=4% desaturation) resulting in an Apnea\Hypopnea Index (AHI >=4% desaturation) of 11.9 events per hour.  There were 59 Respiratory Effort Related Arousals resulting in a RERA index of 13.8 events per hour. The Respiratory Disturbance Index is 38.6 events per hour.  The snore index was - events per hour.  Mean oxygen saturation was 95.9%.   The lowest oxygen saturation during sleep was 89.0%.  Time spent <=88% oxygen saturation was 0.4 minutes (0.1%).  Limb Activity There were 344 total limb movements recorded, of this total, 341 were classified as PLMs.  PLM index was 79.8 per hour and PLM associated with Arousals index was 4.9 per hour.  Cardiac Summary The average pulse rate was 62.0 bpm.  The minimum pulse rate was 58.0 bpm while the maximum pulse rate was 80.0 bpm.  Cardiac rhythm was paced with occasional PVC.  Comment: Mild obstructive sleep apnea, AHI (3%) 11.9/hr. Snoring with oxygen desaturation to a nadir of 89%, mean 95.9%. Periodic Limb Movement as above, mostly without directly related arousal..  Diagnosis: Obstructive sleep apnea.  Recommendations: Consider management options for mild OSA. Consider if limb movement is sufficiently disruptive enough to warrant treatment.   This study was personally reviewed and electronically signed by: Dr. Reggy Salt Accredited Board Certified in Sleep Medicine Date/Time: 09/02/23  11:04    %%endinterp%%   Diagnostic PSG Report  Patient Name: Alan [name], Alan [name] Date: 08/27/2023  Date of Birth: August 11, 1940 Study Type: Diagnostic  Age: 83 year MRN #: [id number]  Sex: Male Interpreting Physician: SALT REGGY, [id number]  Height: 5' 10 Referring Physician: ALMARIE FERRARI [phone])  Weight: 200.0 lbs Recording Tech: Orie Sires RRT RPSGT RST  BMI: 29.0 Scoring Tech: Orie Sires RRT RPSGT RST  ESS: 2 Neck Size: 17.5   Study Overview  Lights Off: 09:48:25 PM  Count Index  Lights On: 04:28:00 AM Awakenings: 52 12.2  Time in Bed: 399.6 min. Arousals: 153 35.8  Total Sleep Time: 256.5 min. AHI (>=3% Desat and/or Ar.):  106 24.8   Sleep Efficiency: 64.2% AHI (>=4% Desat): 51 11.9   Sleep Latency: 5.3 min. Limb Movements: 344 80.5  Wake After Sleep Onset: 137.5 min. Snore: - -  REM Latency from Sleep Onset: 191.0 min. Desaturations: 92 21.5     Minimum SpO2 TST: 89.0%     Sleep Architecture  % of Time in Bed Stages Time (mins) % Sleep Time  Wake 143.5   Stage N1 64.5 25.1%  Stage N2 130.0 50.7%  Stage N3 0.0 0.0%  REM 62.0 24.2%   Arousal Summary   NREM REM Sleep Index  Respiratory Arousals 90 16 106 24.8  PLM Arousals 19 2 21  4.9  Isolated Limb Movement Arousals 2 - 2 0.5  Snore Arousals - - - -  Spontaneous Arousals 21 3 24  5.6  Total 132 21 153 35.8   Limb Movement Summary   Count Index  Isolated Limb Movements 3 0.7  Periodic Limb Movements (PLMs) 341 79.8  Total Limb Movements 344 80.5    Respiratory Summary   By Sleep Stage By Body Position Total   NREM REM Supine Non-Supine   Time (min) 194.5 62.0 124.0 132.5 256.5         Obstructive Apnea - 3 - 3 3  Mixed Apnea - - - - -  Central Apnea - - - - -  Total Apneas - 3 - 3 3  Total Apnea Index - 2.9 - 1.4 0.7         Hypopneas (>=3% Desat and/or Ar.) 78 25 71 32 103  AHI (>=3% Desat and/or Ar.) 24.1 27.1 34.4 15.8 24.8         Hypopneas (>=4% Desat) 32 16 28 20  48  AHI (>=4% Desat) 9.9 18.4 13.5 10.4 11.9          RERAs 50 9 50 9 59  RERA Index 15.4 8.7 24.2 4.1 13.8         RDI 39.5 35.8 58.5 19.9 38.6    Respiratory Event Type Index  Central Apneas -  Obstructive Apneas 0.7  Mixed Apneas -  Central Hypopneas -  Obstructive Hypopneas 24.1  Central Apnea + Hypopnea (CAHI) -  Obstructive Apnea + Hypopnea (OAHI) 24.8   Respiratory Event Durations   Apnea Hypopnea   NREM REM NREM REM  Average (seconds) - 38.3 31.3 37.5  Maximum (seconds) - 52.5 68.7 66.9    Oxygen Saturation Summary   Wake NREM REM TST TIB  Average SpO2 (%) 96.7% 95.5% 95.0% 95.4% 95.9%  Minimum SpO2 (%) 80.0% 90.0% 89.0% 89.0% 80.0%  Maximum SpO2 (%) 100.0% 99.0% 99.0% 99.0% 100.0%   Oxygen Saturation Distribution  Range (%) Time in range (min) Time in range (%)  90.0 - 100.0 390.8 99.6%  80.0 - 90.0 1.5 0.4%  70.0 - 80.0 0.0 0.0%  60.0 - 70.0 - -  50.0 - 60.0 - -  0.0 - 50.0 - -   Time Spent <=88% SpO2  Range (%) Time in range (min) Time in range (%)  0.0 - 88.0 0.4 0.1%      Count Index  Desaturations 92 21.5    Cardiac Summary   Wake NREM REM Sleep Total  Average Pulse Rate (BPM) 62.9 61.3 62.1 61.5 62.0  Minimum Pulse Rate (BPM) 58.0 59.0 60.0 59.0 58.0  Maximum Pulse Rate (BPM) 80.0 73.0 77.0 77.0 80.0   Pulse Rate Distribution:  Range (bpm) Time in range (min) Time in range (%)  0.0 - 40.0 - -  40.0 - 60.0 146.6 37.3%  60.0 - 80.0 243.9 62.1%  80.0 - 100.0 - -  100.0 - 120.0 - -  120.0 - 140.0 - -  140.0 - 200.0 - -      Hypnograms                      Technologist Comments  Patient was ordered as a NPSG only. Patient is an 83 year old white male who was sent to the sleep center for OSA. No oxygen was applied. No medications were reported taken while at the sleep center. A few Plm's/Plma's were noted. Questionable cardiac arrhythmias were noted see epochs for examples: 382, 420, etc. 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 One restroom visit was noted. Study was performed in room # 3.                            Reggy Salt Diplomate, Biomedical engineer of Sleep Medicine  ELECTRONICALLY SIGNED ON:  09/02/2023, 11:08 AM [facility] PH: [phone]   FX: [phone] ACCREDITED BY THE [employer]ICAN AC[employer]EMY OF SLEEP MEDICINE

## 2023-09-02 NOTE — Procedures (Signed)
  Indications for Polysomnography The patient is an 83 year old Male who is 5' 10 and weighs 200.0 lbs. His BMI equals 29.0.  A full night polysomnogram was performed to evaluate for -.  No medications were reported taken during the night.No Data. Polysomnogram Data A full night polysomnogram recorded the standard physiologic parameters including EEG, EOG, EMG, EKG, nasal and oral airflow.  Respiratory parameters of chest and abdominal movements were recorded with Respiratory Inductance Plethysmography belts.   Oxygen saturation was recorded by pulse oximetry.  Sleep Architecture The total recording time of the polysomnogram was 399.6 minutes.  The total sleep time was 256.5 minutes.  The patient spent 25.1% of total sleep time in Stage N1, 50.7% in Stage N2, 0.0% in Stages N3, and 24.2% in REM.  Sleep latency was 5.3 minutes.   REM latency was 191.0 minutes.  Sleep Efficiency was 64.2%.  Wake after Sleep Onset time was 137.5 minutes.  Respiratory Events The polysomnogram revealed a presence of 3 obstructive, - central, and - mixed apneas resulting in an Apnea index of 0.7 events per hour.  There were 103 hypopneas (GreaterEqual to3% desaturation and/or arousal) resulting in an Apnea\Hypopnea Index (AHI  GreaterEqual to3% desaturation and/or arousal) of 24.8 events per hour.  There were 48 hypopneas (GreaterEqual to4% desaturation) resulting in an Apnea\Hypopnea Index (AHI GreaterEqual to4% desaturation) of 11.9 events per hour.  There were 59  Respiratory Effort Related Arousals resulting in a RERA index of 13.8 events per hour. The Respiratory Disturbance Index is 38.6 events per hour.  The snore index was - events per hour.  Mean oxygen saturation was 95.9%.  The lowest oxygen saturation during sleep was 89.0%.  Time spent LessEqual to88% oxygen saturation was  minutes ().  Limb Activity There were 344 total limb movements recorded, of this total, 341 were classified as PLMs.  PLM index was  79.8 per hour and PLM associated with Arousals index was 4.9 per hour.  Cardiac Summary The average pulse rate was 62.0 bpm.  The minimum pulse rate was 58.0 bpm while the maximum pulse rate was 80.0 bpm.  Cardiac rhythm was normal/abnormal.  Diagnosis:  Recommendations:   This study was personally reviewed and electronically signed by: Dr. Reggy Salt Accredited Board Certified in Sleep Medicine Date/Time:

## 2023-09-03 ENCOUNTER — Inpatient Hospital Stay: Payer: Medicare HMO | Attending: Hematology

## 2023-09-03 DIAGNOSIS — D472 Monoclonal gammopathy: Secondary | ICD-10-CM | POA: Insufficient documentation

## 2023-09-03 LAB — CBC WITH DIFFERENTIAL (CANCER CENTER ONLY)
Abs Immature Granulocytes: 0.01 K/uL (ref 0.00–0.07)
Basophils Absolute: 0.1 K/uL (ref 0.0–0.1)
Basophils Relative: 1 %
Eosinophils Absolute: 0.1 K/uL (ref 0.0–0.5)
Eosinophils Relative: 3 %
HCT: 31 % — ABNORMAL LOW (ref 39.0–52.0)
Hemoglobin: 10.2 g/dL — ABNORMAL LOW (ref 13.0–17.0)
Immature Granulocytes: 0 %
Lymphocytes Relative: 27 %
Lymphs Abs: 1.3 K/uL (ref 0.7–4.0)
MCH: 31.5 pg (ref 26.0–34.0)
MCHC: 32.9 g/dL (ref 30.0–36.0)
MCV: 95.7 fL (ref 80.0–100.0)
Monocytes Absolute: 0.5 K/uL (ref 0.1–1.0)
Monocytes Relative: 10 %
Neutro Abs: 2.9 K/uL (ref 1.7–7.7)
Neutrophils Relative %: 59 %
Platelet Count: 76 K/uL — ABNORMAL LOW (ref 150–400)
RBC: 3.24 MIL/uL — ABNORMAL LOW (ref 4.22–5.81)
RDW: 13.7 % (ref 11.5–15.5)
WBC Count: 5 K/uL (ref 4.0–10.5)
nRBC: 0 % (ref 0.0–0.2)

## 2023-09-03 LAB — CMP (CANCER CENTER ONLY)
ALT: 13 U/L (ref 0–44)
AST: 17 U/L (ref 15–41)
Albumin: 3.7 g/dL (ref 3.5–5.0)
Alkaline Phosphatase: 63 U/L (ref 38–126)
Anion gap: 5 (ref 5–15)
BUN: 42 mg/dL — ABNORMAL HIGH (ref 8–23)
CO2: 26 mmol/L (ref 22–32)
Calcium: 9.1 mg/dL (ref 8.9–10.3)
Chloride: 107 mmol/L (ref 98–111)
Creatinine: 2.14 mg/dL — ABNORMAL HIGH (ref 0.61–1.24)
GFR, Estimated: 30 mL/min — ABNORMAL LOW (ref >60)
Glucose, Bld: 124 mg/dL — ABNORMAL HIGH (ref 70–99)
Potassium: 4.5 mmol/L (ref 3.5–5.1)
Sodium: 138 mmol/L (ref 135–145)
Total Bilirubin: 0.3 mg/dL (ref 0.0–1.2)
Total Protein: 8 g/dL (ref 6.5–8.1)

## 2023-09-03 LAB — LACTATE DEHYDROGENASE: LDH: 122 U/L (ref 98–192)

## 2023-09-03 LAB — SEDIMENTATION RATE: Sed Rate: 45 mm/h — ABNORMAL HIGH (ref 0–16)

## 2023-09-04 ENCOUNTER — Ambulatory Visit: Payer: Self-pay | Admitting: Primary Care

## 2023-09-04 LAB — KAPPA/LAMBDA LIGHT CHAINS
Kappa free light chain: 36 mg/L — ABNORMAL HIGH (ref 3.3–19.4)
Kappa, lambda light chain ratio: 1.89 — ABNORMAL HIGH (ref 0.26–1.65)
Lambda free light chains: 19 mg/L (ref 5.7–26.3)

## 2023-09-04 NOTE — Progress Notes (Signed)
 PSG showed mild OSA, set up visit to discuss results and treatment options

## 2023-09-05 ENCOUNTER — Encounter: Payer: Self-pay | Admitting: Cardiovascular Disease

## 2023-09-05 NOTE — Progress Notes (Signed)
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 are outlined above.  Medication changes, Labs and Tests ordered today are listed in the Patient Instructions below. Patient Instructions  Medication Instructions:  No changes *If you need a refill on your cardiac medications before your next appointment, please call your pharmacy*  Lab Work: None ordered If you have labs (blood work) drawn today and your tests are completely normal, you will receive your results only by: MyChart Message (if you have MyChart) OR A paper copy in the mail If you have any lab test that is abnormal or we need to change your treatment, we will call you to review the results.  Testing/Procedures: None ordered  Follow-Up: At [facility], you and your health needs are our priority.  As part of our continuing mission to provide you with exceptional heart care, our providers are all part of one team.  This team includes your primary Cardiologist (physician) and Advanced Practice Providers or APPs (Physician Assistants and Nurse Practitioners) who all work together to provide you with the care you need, when you need it.  Your next appointment:   6 month(s)  Provider:   Dr Francyne  We recommend signing up  for the patient portal called MyChart.  Sign up information is provided on this After Visit Summary.  MyChart is used to connect with patients for Virtual Visits (Telemedicine).  Patients are able to view lab/test results, encounter notes, upcoming appointments, etc.  Non-urgent messages can be sent to your provider as well.   To learn more about what you can do with MyChart, go to [email or web address].           Signed, [name] Francyne, MD  09/05/2023 11:50 AM    [facility] [address] [place], [place], [place]  [address] Phone: [phone]; Fax: [phone]

## 2023-09-06 ENCOUNTER — Telehealth: Payer: Self-pay | Admitting: Emergency Medicine

## 2023-09-06 LAB — MULTIPLE MYELOMA PANEL, SERUM
Albumin SerPl Elph-Mcnc: 3.3 g/dL (ref 2.9–4.4)
Albumin/Glob SerPl: 0.9 (ref 0.7–1.7)
Alpha 1: 0.2 g/dL (ref 0.0–0.4)
Alpha2 Glob SerPl Elph-Mcnc: 0.7 g/dL (ref 0.4–1.0)
B-Globulin SerPl Elph-Mcnc: 0.8 g/dL (ref 0.7–1.3)
Gamma Glob SerPl Elph-Mcnc: 2.4 g/dL — ABNORMAL HIGH (ref 0.4–1.8)
Globulin, Total: 4.1 g/dL — ABNORMAL HIGH (ref 2.2–3.9)
IgA: 99 mg/dL (ref 61–437)
IgG (Immunoglobin G), Serum: 2411 mg/dL — ABNORMAL HIGH (ref 603–1613)
IgM (Immunoglobulin M), Srm: 23 mg/dL (ref 15–143)
M Protein SerPl Elph-Mcnc: 2.1 g/dL — ABNORMAL HIGH
Total Protein ELP: 7.4 g/dL (ref 6.0–8.5)

## 2023-09-06 NOTE — Telephone Encounter (Signed)
 Left message with the information below: [name], [name], MD  Davee Comer CROME, RN Was looking over his stuff.  He does not need to take ezetimibe  anymore.  Please tell him and remove from list.  [facility] as well

## 2023-09-17 ENCOUNTER — Inpatient Hospital Stay: Payer: Medicare HMO | Attending: Hematology | Admitting: Hematology

## 2023-09-17 ENCOUNTER — Inpatient Hospital Stay: Attending: Hematology

## 2023-09-17 VITALS — BP 133/69 | HR 86 | Temp 97.3°F | Resp 20 | Wt 202.8 lb

## 2023-09-17 DIAGNOSIS — I255 Ischemic cardiomyopathy: Secondary | ICD-10-CM | POA: Diagnosis not present

## 2023-09-17 DIAGNOSIS — D508 Other iron deficiency anemias: Secondary | ICD-10-CM | POA: Diagnosis not present

## 2023-09-17 DIAGNOSIS — I2510 Atherosclerotic heart disease of native coronary artery without angina pectoris: Secondary | ICD-10-CM | POA: Insufficient documentation

## 2023-09-17 DIAGNOSIS — D696 Thrombocytopenia, unspecified: Secondary | ICD-10-CM | POA: Insufficient documentation

## 2023-09-17 DIAGNOSIS — D472 Monoclonal gammopathy: Secondary | ICD-10-CM | POA: Diagnosis present

## 2023-09-17 DIAGNOSIS — D631 Anemia in chronic kidney disease: Secondary | ICD-10-CM | POA: Insufficient documentation

## 2023-09-17 DIAGNOSIS — D649 Anemia, unspecified: Secondary | ICD-10-CM

## 2023-09-17 DIAGNOSIS — I130 Hypertensive heart and chronic kidney disease with heart failure and stage 1 through stage 4 chronic kidney disease, or unspecified chronic kidney disease: Secondary | ICD-10-CM | POA: Diagnosis not present

## 2023-09-17 DIAGNOSIS — N183 Chronic kidney disease, stage 3 unspecified: Secondary | ICD-10-CM | POA: Diagnosis not present

## 2023-09-17 DIAGNOSIS — E1122 Type 2 diabetes mellitus with diabetic chronic kidney disease: Secondary | ICD-10-CM | POA: Diagnosis not present

## 2023-09-17 LAB — CBC WITH DIFFERENTIAL (CANCER CENTER ONLY)
Abs Immature Granulocytes: 0.02 K/uL (ref 0.00–0.07)
Basophils Absolute: 0.1 K/uL (ref 0.0–0.1)
Basophils Relative: 1 %
Eosinophils Absolute: 0.2 K/uL (ref 0.0–0.5)
Eosinophils Relative: 4 %
HCT: 33.7 % — ABNORMAL LOW (ref 39.0–52.0)
Hemoglobin: 11.3 g/dL — ABNORMAL LOW (ref 13.0–17.0)
Immature Granulocytes: 0 %
Lymphocytes Relative: 22 %
Lymphs Abs: 1.2 K/uL (ref 0.7–4.0)
MCH: 32.1 pg (ref 26.0–34.0)
MCHC: 33.5 g/dL (ref 30.0–36.0)
MCV: 95.7 fL (ref 80.0–100.0)
Monocytes Absolute: 0.5 K/uL (ref 0.1–1.0)
Monocytes Relative: 9 %
Neutro Abs: 3.4 K/uL (ref 1.7–7.7)
Neutrophils Relative %: 64 %
Platelet Count: 83 K/uL — ABNORMAL LOW (ref 150–400)
RBC: 3.52 MIL/uL — ABNORMAL LOW (ref 4.22–5.81)
RDW: 13.9 % (ref 11.5–15.5)
WBC Count: 5.3 K/uL (ref 4.0–10.5)
nRBC: 0 % (ref 0.0–0.2)

## 2023-09-17 LAB — FERRITIN: Ferritin: 73 ng/mL (ref 24–336)

## 2023-09-17 LAB — IRON AND IRON BINDING CAPACITY (CC-WL,HP ONLY)
Iron: 89 ug/dL (ref 45–182)
Saturation Ratios: 24 % (ref 17.9–39.5)
TIBC: 368 ug/dL (ref 250–450)
UIBC: 279 ug/dL (ref 117–376)

## 2023-09-17 LAB — IMMATURE PLATELET FRACTION: Immature Platelet Fraction: 3.5 % (ref 1.2–8.6)

## 2023-09-17 LAB — VITAMIN B12: Vitamin B-12: 1041 pg/mL — ABNORMAL HIGH (ref 180–914)

## 2023-09-18 ENCOUNTER — Telehealth: Payer: Self-pay | Admitting: Hematology

## 2023-09-18 LAB — ERYTHROPOIETIN: Erythropoietin: 11.9 m[IU]/mL (ref 2.6–18.5)

## 2023-09-18 NOTE — Telephone Encounter (Signed)
 I LVM for [name] and informed him of his 10 week follow up appointments scheduled. I asked [name] to return my call if he needs to re-schedule.

## 2023-09-21 ENCOUNTER — Telehealth: Payer: Self-pay

## 2023-09-21 NOTE — Telephone Encounter (Signed)
 OK to hold apixaban  for 2 days and ok to proceed w colonoscopy

## 2023-09-21 NOTE — Telephone Encounter (Signed)
 Pharmacy please advise on holding Eliquis  prior to colonoscopy and Push Enteroscopy scheduled for 10/15/2023. Last labs: CBC 8/11; CMET 7/28  Thank you.

## 2023-09-21 NOTE — Telephone Encounter (Signed)
   Pre-operative Risk Assessment    Patient Name: Alan [name]  DOB: 04-04-1940 MRN: [id number]   Date of last office visit: 08/30/23 Date of next office visit: Not scheduled   Request for Surgical Clearance    Procedure:  Colonoscopy and Push Enteroscopy  Date of Surgery:  Clearance 10/15/23                                Surgeon:  Dr. Jefrey Socks Group or Practice Name:  [facility] Phone number:  [phone] Fax number:  [phone]   Type of Clearance Requested:   - Medical  - Pharmacy:  Hold Apixaban  (Eliquis ) x2 days prior   Type of Anesthesia:  Not Indicated   Additional requests/questions:    Bonney Ival LOISE Gerome   09/21/2023, 8:49 AM

## 2023-09-21 NOTE — Telephone Encounter (Signed)
 Dr.[name]  You saw this patient on 08/30/2023. Per protocol we request that you comment on his cardiac risk to proceed with Colonoscopy and Push Enteroscopy scheduled for 10/15/2023, since it has been less than 2 months since evaluated in the office. Please send your comment to P CV Pre-Op Pool.  Thank you, Lamarr Satterfield DNP, ANP, AACC.

## 2023-09-23 ENCOUNTER — Encounter: Payer: Self-pay | Admitting: Hematology

## 2023-09-23 DIAGNOSIS — D509 Iron deficiency anemia, unspecified: Secondary | ICD-10-CM | POA: Insufficient documentation

## 2023-09-23 NOTE — Progress Notes (Signed)
 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  INTERVAL HISTORY:  Alan [name] is a 83 y.o. 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   Patient is here with his caregiver.  He notes no acute new symptoms since his last clinic visit about 4 months ago.  No new focal bone pains.  No fevers no chills no night sweats.  No abdominal pain or distention. No infection issues. Does report that he will need a emergency room at the end of April with chest pain. Since his last visit with no significant injuries. Labs done from 09/03/2023 were discussed with him in detail.  MEDICAL HISTORY:  Past Medical History:  Diagnosis Date   Anticoagulant long-term use    Arthritis    Arthropathy of left shoulder    Atypical mole    Biceps tendonitis, left    Bursitis of left shoulder    Cardiac pacemaker in situ    first insertion 2008;  genertor and new lead change 03-08-2012;   [employer]   CHB (complete heart block) (HCC)    s/p  PPM 2008   Chronic back pain    CKD (chronic kidney disease), stage III [facility])    Coronary artery disease cardiologist--- dr croitoru   hx  CABG x2  1990 and re-do 2004;  multiple PCI to RCA 1990 to 2004;    cardiac cath 2012  occluded LAD and RCA with patent grafts   Difficult intubation     Dr. Francyne said that it was  difficulty to get the tube in.   History of coronary angioplasty    multiple PCI to RCA ,  1990 to 2004   History of GI bleed followed by GI-- [name] @ [facility] [place]   01/ 2020  upper and lower GI bleed ;  s/p  EGD with cautery of jejunal bleed and blood transfusion's   History of kidney stones    Hx of echocardiogram 06/29/2008   EF 45-50%    Hypertension    Ischemic cardiomyopathy    previously reported , ef 40-45%;  2010--- ef 45-50%;   last echo 2017 55-60%   Mixed hyperlipidemia    Nocturia    OSA on CPAP    cpap, 12, last sleep study Jan2013, sees Dr. 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dx polio age 66;   09-30-2018  per pt occasional gait issues and occasion fall   Presence of permanent cardiac pacemaker    Pseudoarthrosis of lumbar spine    Rotator cuff tear, left    S/P CABG x 2    1990 x2  and 2004  x2   Sinus node dysfunction (HCC)    2015--- sinus node arrest  with intact AV conduction   Squamous cell carcinoma of skin 06/14/2021   Mid Parietal Scalp (in situ) (  tx p bx)   Type 2 diabetes mellitus (HCC)    followed by pcp    SURGICAL HISTORY: Past Surgical History:  Procedure Laterality Date   APPENDECTOMY  child   APPLICATION OF INTRAOPERATIVE CT SCAN N/A 07/20/2020   Procedure: APPLICATION OF INTRAOPERATIVE CT SCAN;  Surgeon: [name], [name], DO;  Location: [facility] OR;  Service: Neurosurgery;  Laterality: N/A;   BLEPHAROPLASTY Bilateral 12/2014   upper eyelid   CARDIAC PACEMAKER PLACEMENT  2008   CARDIOVERSION N/A 01/15/2019   Procedure: CARDIOVERSION;  Surgeon: [name], MD;  Location: [facility] ENDOSCOPY;  Service: Cardiovascular;  Laterality: N/A;   CARPAL TUNNEL RELEASE Bilateral 2015;  2016   CATARACT EXTRACTION W/ INTRAOCULAR LENS  IMPLANT, BILATERAL  2017   CORONARY ANGIOPLASTY  1990  to 2004   multiple to RCA   CORONARY ARTERY BYPASS GRAFT   1990    @ [place] (dr chitwood)   seq. LIMA to LAD and Diagonal   CORONARY ARTERY BYPASS GRAFT  2004     dr florette   SVG to  RCA and PDA;  ALSO MAZE  PROCEDURE   ESOPHAGOGASTRODUODENOSCOPY Left 03/18/2013   Procedure: ESOPHAGOGASTRODUODENOSCOPY (EGD);  Surgeon: [name], MD;  Location[name] ENDOSCOPY;  Service: Endoscopy;  Laterality: Left;   HARDWARE REMOVAL N/A 05/18/2016   Procedure: Removal of broken hardware Lumbar three-four;  Surgeon: [name], MD;  Location: [facility] OR;  Service: Neurosurgery;  Laterality: N/A;   HERNIA REPAIR     LEFT HEART CATH AND CORS/GRAFTS ANGIOGRAPHY N/A 12/18/2018   Procedure: LEFT HEART CATH AND CORS/GRAFTS ANGIOGRAPHY;  Surgeon: [name], MD;  Location: [facility] INVASIVE CV LAB;  Service: Cardiovascular;  Laterality: N/A;   LEFT HEART CATHETERIZATION WITH CORONARY/GRAFT ANGIOGRAM N/A 12/21/2010   Procedure: LEFT HEART CATHETERIZATION WITH EL BILE;  Surgeon: Alm LELON Clay, MD;  Location: [facility] CATH LAB;  Service: Cardiovascular;  Laterality: N/A;   LEG SURGERY Right 2002  approx.   lengthened his leg   LUMBAR LAMINECTOMY/DECOMPRESSION MICRODISCECTOMY  01/10/2012   Procedure: LUMBAR LAMINECTOMY/DECOMPRESSION MICRODISCECTOMY 1 LEVEL;  Surgeon: [name], MD;  Location: [facility] NEURO ORS;  Service: Neurosurgery;  Laterality: Bilateral;  Lumbar three-four decompression, Posterior lateral fusion lumbar three-four, posterior spinus plate lumbar three-four   LUMBAR LAMINECTOMY/DECOMPRESSION MICRODISCECTOMY Left 05/18/2016   Procedure: Laminectomy and Foraminotomy - Lumbar two-lumbar three- Lumbar four-lumbar five- left with removal hardware lumbar three-four;  Surgeon: [name], MD;  Location: [facility] OR;  Service: Neurosurgery;  Laterality: Left;   NASAL SEPTUM SURGERY  yrs ago   PACEMAKER REVISION N/A 03/07/2012   Procedure: PACEMAKER REVISION;  Surgeon: Headland Francyne, MD;  Location: [facility] CATH LAB;  Service: Cardiovascular;  Laterality: N/A;    POSTERIOR LUMBAR FUSION  02-22-2017   dr molt  @ [facility]   re-do laminectomy L2-3 and fusion   POSTERIOR LUMBAR FUSION  03-11-2018   dr molt @ [facility]   laminectomy L4-5;  fixation L2-S1 and fusion L3-S1   SACROILIAC JOINT FUSION N/A 07/20/2020   Procedure: Sacroiliac Joint Fusion;  Surgeon: Carollee [name], DO;  Location: [facility] OR;  Service: Neurosurgery;  Laterality: N/A;   SHOULDER ARTHROSCOPY Right after 2016, pt unsure year   SHOULDER ARTHROSCOPY WITH OPEN ROTATOR CUFF REPAIR Right 03/30/2014   Procedure: RIGHT SHOULDER ARTHROSCOPY  OPEN ROTATOR CUFF REPAIR;  Surgeon: Dempsey JINNY Sensor, MD;  Location: [facility] OR;  Service: Orthopedics;  Laterality: Right;   SHOULDER ARTHROSCOPY WITH SUBACROMIAL DECOMPRESSION, ROTATOR CUFF REPAIR AND BICEP TENDON  REPAIR Left 10/01/2018   Procedure: LEFT SHOULDER ARTHROSCOPY, DISTAL CLAVICLE EXCISION,SUBACROMIAL, DECOMPRESSION, RORATOR CUFF REPAIR, BICEPS TENODESIS;  Surgeon: Beverley Evalene BIRCH, MD;  Location: [facility];  Service: Orthopedics;  Laterality: Left;   SHOULDER OPEN ROTATOR CUFF REPAIR Right 03/30/2014   Procedure: ROTATOR CUFF REPAIR SHOULDER OPEN;  Surgeon: Dempsey JINNY Sensor, MD;  Location: [facility] OR;  Service: Orthopedics;  Laterality: Right;   TEE WITHOUT CARDIOVERSION N/A 01/15/2019   Procedure: TRANSESOPHAGEAL ECHOCARDIOGRAM (TEE);  Surgeon: [name], MD;  Location: [facility] ENDOSCOPY;  Service: Cardiovascular;  Laterality: N/A;   TONSILLECTOMY  child   TOTAL KNEE ARTHROPLASTY Left 2006    SOCIAL HISTORY: Social History   Socioeconomic History   Marital status: Single    Spouse name: Not on file   Number of children: 1   Years of education: 16   Highest education level: Associate degree: academic program  Occupational History   Occupation: pilot-retired  Tobacco Use   Smoking status: Never   Smokeless tobacco: Never  Vaping Use   Vaping status: Never Used  Substance and Sexual Activity   Alcohol use: Not Currently   Drug use: Never    Sexual activity: Yes  Other Topics Concern   Not on file  Social History Narrative   ** Merged History Encounter **       [place], [place].  Right-handed. 1 cup caffeine per day.   Social Drivers of Corporate investment banker Strain: Low Risk  (05/23/2023)   Received from [facility]   Overall Financial Resource Strain (CARDIA)    Difficulty of Paying Living Expenses: Not hard at all  Food Insecurity: No Food Insecurity (06/02/2023)   Hunger Vital Sign    Worried About Running Out of Food in the Last Year: Never true    Ran Out of Food in the Last Year: Never true  Transportation Needs: No Transportation Needs (06/02/2023)   PRAPARE - Administrator, Civil Service (Medical): No    Lack of Transportation (Non-Medical): No  Physical Activity: Sufficiently Active (05/23/2023)   Received from [facility]   Exercise Vital Sign    On average, how many days per week do you engage in moderate to strenuous exercise (like a brisk walk)?: 5 days    On average, how many minutes do you engage in exercise at this level?: 60 min  Stress: No Stress Concern Present (05/23/2023)   Received from [facility] - Occupational Stress Questionnaire    Feeling of Stress : Not at all  Social Connections: Socially Integrated (06/02/2023)   Social Connection and Isolation Panel    Frequency of Communication with Friends and Family: More than three times a week    Frequency of Social Gatherings with Friends and Family: Three times a week    Attends Religious Services: More than 4 times per year    Active Member of Clubs or Organizations: Yes    Attends Banker Meetings: 1 to 4 times per year    Marital Status: Married  Catering manager Violence: Not At Risk (06/02/2023)   Humiliation, Afraid, Rape, and Kick questionnaire    Fear of Current or Ex-Partner: No    Emotionally Abused: No    Physically Abused: No     Sexually Abused: No  Retired Occupational hygienist.  Also worked as a Firefighter.  Lives in [place]. Non-smoker.  Rare alcohol use.   FAMILY HISTORY:  Family History  Problem Relation Age of Onset   Other Father        MVA   Cancer Mother     ALLERGIES:  has no known allergies.  MEDICATIONS:  Current Outpatient Medications  Medication Sig Dispense Refill   amiodarone  (PACERONE ) 200 MG tablet Take 200 mg by mouth daily.     apixaban  (ELIQUIS ) 2.5 MG TABS tablet Take 1 tablet (2.5 mg total) by mouth 2 (two) times daily. 180 tablet 3   Baclofen  5 MG TABS Take 5 mg by mouth at bedtime. (Patient not taking: Reported on 08/30/2023)     carvedilol  (COREG ) 25 MG tablet Take 2 tablets (50 mg total) by mouth 2 (two) times daily with a meal. 120 tablet 6   Continuous Glucose Receiver (DEXCOM G7 RECEIVER) DEVI SMARTSIG:1 Unit(s) Daily PRN     Continuous Glucose Sensor (DEXCOM G7 SENSOR) MISC 1 Units by Does not apply route.     Cyanocobalamin (VITAMIN B-12 PO) Take 1 tablet by mouth daily.     cyanocobalamin (VITAMIN B12) 1000 MCG tablet Take 1,000 mcg by mouth daily.     DULoxetine (CYMBALTA) 60 MG capsule Take 60 mg by mouth daily. (Patient not taking: Reported on 08/30/2023)     empagliflozin  (JARDIANCE ) 10 MG TABS tablet Take 1 tablet (10 mg total) by mouth daily before breakfast. 90 tablet 3   Evolocumab  (REPATHA  SURECLICK) 140 MG/ML SOAJ Inject 140 mg into the skin every 14 (fourteen) days. 6 mL 2   ferrous sulfate  325 (65 FE) MG tablet Take 325 mg by mouth daily at 6 (six) AM.     glipiZIDE  (GLUCOTROL ) 10 MG tablet Take 10 mg by mouth 2 (two) times daily. (Patient taking differently: Take 5 mg by mouth daily at 6 (six) AM.)     insulin  lispro (HUMALOG ) 100 UNIT/ML injection Inject 0-10 Units into the skin 2 (two) times daily as needed for high blood sugar. Per sliding scale.     JARDIANCE  10 MG TABS tablet Take 1 tablet by mouth once daily 90 tablet 0   Multiple Vitamin (MULTIVITAMIN WITH MINERALS) TABS  tablet Take 1 tablet by mouth daily.      ONETOUCH ULTRA test strip 1 each by Other route as needed.     OZEMPIC, 0.25 OR 0.5 MG/DOSE, 2 MG/3ML SOPN Inject 0.5 mg into the skin once a week.     spironolactone  (ALDACTONE ) 25 MG tablet Take 12.5 mg by mouth daily.     torsemide  (DEMADEX ) 20 MG tablet Take 10 mg by mouth daily.     triamcinolone cream (KENALOG) 0.1 % Apply 1 Application topically 2 (two) times daily as needed (rough skin patches). (Patient not taking: Reported on 08/30/2023)     No current facility-administered medications for this visit.    REVIEW OF SYSTEMS:   10 Point review of Systems was done is negative except as noted above.  PHYSICAL EXAMINATION: ECOG PERFORMANCE STATUS: 2 - Symptomatic, <50% confined to bed  . Vitals:   09/17/23 1100  BP: 133/69  Pulse: 86  Resp: 20  Temp: (!) 97.3 F (36.3 C)  SpO2: 100%   Filed Weights   09/17/23 1100  Weight: 202 lb 12.8 oz (92 kg)   .Body mass index is 29.1 kg/m. NAD GENERAL:alert, in no acute distress and comfortable SKIN: no acute rashes, no significant lesions EYES: conjunctiva are pink and non-injected, sclera anicteric OROPHARYNX: MMM, no exudates, no oropharyngeal erythema or ulceration NECK: supple, no JVD LYMPH:  no palpable  lymphadenopathy in the cervical, axillary or inguinal regions LUNGS: clear to auscultation b/l with normal respiratory effort HEART: regular rate & rhythm ABDOMEN:  normoactive bowel sounds , non tender, not distended. Extremity: no pedal edema PSYCH: alert & oriented x 3 with fluent speech NEURO: no focal motor/sensory deficits   LABORATORY DATA:  I have reviewed the data as listed  .    Latest Ref Rng & Units 09/17/2023   12:12 PM 09/03/2023   12:52 PM 06/03/2023    2:10 AM  CBC  WBC 4.0 - 10.5 K/uL 5.3  5.0  5.0   Hemoglobin 13.0 - 17.0 g/dL 88.6  89.7  88.7   Hematocrit 39.0 - 52.0 % 33.7  31.0  34.0   Platelets 150 - 400 K/uL 83  76  62     .    Latest Ref Rng &  Units 09/03/2023   12:52 PM 06/03/2023    2:10 AM 06/02/2023    3:49 AM  CMP  Glucose 70 - 99 mg/dL 875  861    BUN 8 - 23 mg/dL 42  30    Creatinine 9.38 - 1.24 mg/dL 7.85  8.30    Sodium 864 - 145 mmol/L 138  135    Potassium 3.5 - 5.1 mmol/L 4.5  4.4    Chloride 98 - 111 mmol/L 107  105    CO2 22 - 32 mmol/L 26  26    Calcium  8.9 - 10.3 mg/dL 9.1  8.8    Total Protein 6.5 - 8.1 g/dL 8.0   8.3   Total Bilirubin 0.0 - 1.2 mg/dL 0.3   0.7   Alkaline Phos 38 - 126 U/L 63   60   AST 15 - 41 U/L 17   33   ALT 0 - 44 U/L 13   26    RADIOGRAPHIC STUDIES: I have personally reviewed the radiological images as listed and agreed with the findings in the report. No results found.  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 and less likely to the monoclonal paraproteinemia.  Also patient has ischemic cardiomyopathy which is likely explanation for his congestive heart failure along with decreasing diuretics and seems to be less likely related to AL amyloidosis clinically.  PLAN: -Discussed lab results from 09/03/2023  CBC shows drop in hemoglobin to 10.2 with normal WBC count and platelets of 76k CMP shows creatinine of 2.14 which shows stable chronic kidney disease CMP stable LDH is within normal limits at 122 Myeloma panel shows M spike at 2.1 g/dL IgG kappa monoclonal protein up from 1.8 Given the patient's worsening anemia and thrombocytopenia we discussed and patient is agreeable with additional labs today.  Anticipate that the anemia is primarily from his chronic kidney disease and possibly some functional  iron deficiency and not from overt myeloma progression.  . Orders Placed This Encounter  Procedures   Ferritin    Standing Status:   Future    Number of Occurrences:   1    Expected Date:   09/17/2023    Expiration Date:   09/16/2024   Iron and Iron Binding Capacity ([facility] only)    Standing Status:  Future    Number of Occurrences:   1    Expected Date:   09/17/2023    Expiration Date:   09/16/2024   Vitamin B12    Standing Status:   Future    Number of Occurrences:   1    Expected Date:   09/17/2023    Expiration Date:   09/16/2024   CBC with Differential (Cancer Center Only)    Standing Status:   Future    Number of Occurrences:   1    Expected Date:   09/17/2023    Expiration Date:   09/16/2024   Immature Platelet Fraction    Standing Status:   Future    Number of Occurrences:   1    Expected Date:   09/17/2023    Expiration Date:   09/16/2024   Erythropoietin     Standing Status:   Future    Number of Occurrences:   1    Expected Date:   09/17/2023    Expiration Date:   09/16/2024    FOLLOW-UP:  Labs today Return to clinic with Dr. Onesimo with labs in 10 weeks    ADDENDUM Component     Latest Ref Rng 09/17/2023  WBC     4.0 - 10.5 K/uL 5.3   RBC     4.22 - 5.81 MIL/uL 3.52 (L)   Hemoglobin     13.0 - 17.0 g/dL 88.6 (L)   HCT     60.9 - 52.0 % 33.7 (L)   MCV     80.0 - 100.0 fL 95.7   MCH     26.0 - 34.0 pg 32.1   MCHC     30.0 - 36.0 g/dL 66.4   RDW     88.4 - 84.4 % 13.9   Platelets     150 - 400 K/uL 83 (L)   nRBC     0.0 - 0.2 % 0.0   Neutrophils     % 64   NEUT#     1.7 - 7.7 K/uL 3.4   Lymphocytes     % 22   Lymphs Abs     0.7 - 4.0 K/uL 1.2   Monocytes Relative     % 9   Monocyte #     0.1 - 1.0 K/uL 0.5   Eosinophil     % 4   Eosinophils Absolute     0.0 - 0.5 K/uL 0.2   Basophil     % 1   Basophils Absolute     0.0 - 0.1 K/uL 0.1   Immature Granulocytes     % 0   Abs Immature Granulocytes     0.00 - 0.07 K/uL 0.02   Iron      45 - 182 ug/dL 89   TIBC     749 - 549 ug/dL 631   Saturation Ratios     17.9 - 39.5 % 24   UIBC     117 - 376 ug/dL 720   Ferritin     24 - 336 ng/mL 73   Vitamin B12     180 - 914 pg/mL 1,041 (H)   Immature Platelet Fraction     1.2 - 8.6 % 3.5   Erythropoietin      2.6 - 18.5 mIU/mL 11.9     Legend: (L) Low (H) High  Patient is hemoglobin is better at 11.3 with platelets of 83k Normal B12 levels Ferritin is 73 with a goal of ferritin  of 250 in the context of chronic kidney disease Erythropoietin  level is relatively low at 11.9.  No indication of ESA's at current hemoglobin levels. Patient had fecal occult blood testing with PCP which was positive and will follow-up with PCP to consider GI referral for further evaluation of this. Referral given option of IV iron.

## 2023-09-23 NOTE — Telephone Encounter (Signed)
 Patient with diagnosis of atrial fibrillation on Eliquis  for anticoagulation.    Procedure:  Colonoscopy and Push Enteroscopy   Date of Surgery:  Clearance 10/15/23                    CHA2DS2-VASc Score = 6   This indicates a 9.7% annual risk of stroke. The patient's score is based upon: CHF History: 1 HTN History: 1 Diabetes History: 1 Stroke History: 0 Vascular Disease History: 1 Age Score: 2 Gender Score: 0      Procedure:  Colonoscopy and Push Enteroscopy   Date of Surgery:  Clearance 10/15/23                   Patient has not had an Afib/aflutter ablation within the last 3 months or DCCV within the last 30 days  Per office protocol, patient can hold Eliquis  for 2 days prior to procedure.   Patient will not need bridging with Lovenox  (enoxaparin ) around procedure.  **This guidance is not considered finalized until pre-operative APP has relayed final recommendations.**

## 2023-09-24 ENCOUNTER — Other Ambulatory Visit: Payer: Self-pay | Admitting: Hematology

## 2023-09-24 ENCOUNTER — Telehealth: Payer: Self-pay

## 2023-09-24 NOTE — Telephone Encounter (Signed)
 Dr. Onesimo and [facility], patient will be scheduled as soon as possible.  Auth Submission: NO AUTH NEEDED Site of care: Site of care: [facility] Payer: [employer] medicare Medication & CPT/J Code(s) submitted: Venofer (Iron Sucrose) J1756 Diagnosis Code:  Route of submission (phone, fax, portal):  Phone # Fax # Auth type: Buy/Bill PB Units/visits requested: 200mg  x 5 doses Reference number:  Approval from: 09/24/23 to 01/24/24

## 2023-09-24 NOTE — Telephone Encounter (Addendum)
   Patient Name: Alan [name]  DOB: 1940/06/15 MRN: [id number]  Primary Cardiologist: Ezra Shuck, MD  Chart reviewed as part of pre-operative protocol coverage. Given past medical history and time since last visit, based on ACC/AHA guidelines, Alan [name] is at acceptable risk for the planned procedure without further cardiovascular testing.   Per Dr. Francyne: OK to hold apixaban  for 2 days and ok to proceed w colonoscopy  Patient has not had an Afib/aflutter ablation within the last 3 months or DCCV within the last 30 days   Per office protocol, patient can hold Eliquis  for 2 days prior to procedure.   Patient will not need bridging with Lovenox  (enoxaparin ) around procedure.  I will route this recommendation to the requesting party via Epic fax function and remove from pre-op pool.  Please call with questions.  Lum LITTIE Louis, NP 09/24/2023, 12:00 PM

## 2023-09-24 NOTE — Telephone Encounter (Signed)
 Copied from CRM #[id number]. Topic: Clinical - Lab/Test Results >> Sep 24, 2023  9:59 AM Benton KIDD wrote: Reason for CRM: patient is calling cause he has not heard anything. About sleep test results . Patient is needing to speak with someone pleae give [patient a call  [phone]   Patient has been scheduled with [facility] 10/02/23 at 11:30 to go over sleep study result's.Patient's voice was understanding.Nothing else further needed.

## 2023-09-25 NOTE — Progress Notes (Signed)
 Attempted to contact pt per Dr Onesimo: to let patient know hgb a little better at 11.2. Iron levels still suboptimal and have placed IV iron orders as discussed.  F/u with PCP for evaluation of FOBT +ve stools  Left pt a message

## 2023-09-27 ENCOUNTER — Other Ambulatory Visit (HOSPITAL_COMMUNITY): Payer: Self-pay | Admitting: Family Medicine

## 2023-10-01 NOTE — Progress Notes (Unsigned)
 @Patient  ID: Alan [name], male    DOB: 22-Jul-1940, 83 y.o.   MRN: [id number]  No chief complaint on file.   Referring provider: [name], MD  HPI: 83 year old male never smoker followed for OSA, history UPPP. Complicated by Post polio, AFib/ pacemaker, CAD/CABG,  DM 2, HBP, gout,  DDD,      PCP Dr Sophronia NPSG 05/30/97- AHI 20/ hr, desaturation to 78%, body weight 230 lbs Office Spirometry-02/26/2018- Mild Restriction of exhaled volume.  FVC 2.9/69%, FEV1 2.4/81%, ratio 1.84, FEF 25-75% 3.1/145%. -----------------------------------------------------------------------------  Previous [facility] pulmonary encounter:  06/27/2023 Discussed the use of AI scribe software for clinical note transcription with the patient, who gave verbal consent to proceed.  History of Present Illness   Alan [name] is an 83 year old male with sleep apnea who presents with difficulties tolerating CPAP therapy. He was referred by Dr. Neysa for evaluation of his sleep apnea and consideration of Inspire therapy.  He experiences significant difficulties with CPAP therapy for sleep apnea. The mask causes redness and pain on the top of his nose and shifts during the night, leading to breathing difficulties. He finds the CPAP frustrating and hard to tolerate.  He has a history of atrial fibrillation and is currently taking amiodarone  and a blood thinner. He monitors his heart rate and blood pressure at home and has not noticed any abnormalities. About a month ago, he was hospitalized due to pacemaker issues, and a cardiologist spent several hours adjusting it. He describes a recent episode of feeling clammy and disoriented after a shower, but these symptoms resolved on his own.  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 He enjoys watching planes and uses an app to track flights globally. He occasionally visits the airport to watch planes and sometimes goes flying with friends to  relive his past experiences.      Airview download 05/27/23-06/25/23 Usage days 30/30 days Average usage 8 hours 29 mins Pressure 12cm h20 Airleaks 45.9L/min (median); 103L/min (95%) AHI 3.7    10/02/2023- Interim hx  Discussed the use of AI scribe software for clinical note transcription with the patient, who gave verbal consent to proceed.  History of Present Illness  PSG showed mild OSA, set up visit to discuss results and treatment options, AHI 11.9/hour with Spo2 low 89%      No Known Allergies  Immunization History  Administered Date(s) Administered   Fluad Quad(high Dose 65+) 11/08/2015, 11/07/2016, 11/01/2018, 12/12/2019, 11/15/2020, 11/30/2021   Fluad Trivalent(High Dose 65+) 11/08/2011, 11/08/2015, 11/07/2016, 11/20/2022   INFLUENZA, HIGH DOSE SEASONAL PF 11/15/2012, 11/28/2013, 11/08/2015, 11/07/2016, 11/29/2017   Influenza Split 12/08/2010, 11/08/2011, 11/15/2012   Influenza Whole 11/06/2009, 11/07/2011   Influenza, Seasonal, Injecte, Preservative Fre 12/14/2014   Influenza,inj,Quad PF,6+ Mos 12/14/2014   Influenza,trivalent, recombinat, inj, PF 12/08/2010, 11/08/2011, 11/15/2012   PFIZER(Purple Top)SARS-COV-2 Vaccination 03/13/2019, 04/03/2019, 09/24/2019   Pneumococcal Conjugate-13 12/09/2012   Pneumococcal Polysaccharide-23 01/25/2015   Tdap 09/30/2010, 12/02/2020, 09/05/2022   Zoster Recombinant(Shingrix) 07/19/2017    Past Medical History:  Diagnosis Date   Anticoagulant long-term use    Arthritis    Arthropathy of left shoulder    Atypical mole    Biceps tendonitis, left    Bursitis of left shoulder    Cardiac pacemaker in situ    first insertion 2008;  genertor and new lead change 03-08-2012;   [employer]   CHB (complete heart block) (HCC)    s/p  PPM  2008   Chronic back pain    CKD (chronic kidney disease), stage III [facility])    Coronary artery disease cardiologist--- dr croitoru   hx  CABG x2  1990 and re-do 2004;  multiple PCI to RCA 1990 to 2004;     cardiac cath 2012  occluded LAD and RCA with patent grafts   Difficult intubation     Dr. Francyne said that it was difficulty to get the tube in.   History of coronary angioplasty    multiple PCI to RCA ,  1990 to 2004   History of GI bleed followed by GI-- [name] @ [facility] [place]   01/ 2020  upper and lower GI bleed ;  s/p  EGD with cautery of jejunal bleed and blood transfusion's   History of kidney stones    Hx of echocardiogram 06/29/2008   EF 45-50%    Hypertension    Ischemic cardiomyopathy    previously reported , ef 40-45%;  2010--- ef 45-50%;   last echo 2017 55-60%   Mixed hyperlipidemia    Nocturia    OSA on CPAP    cpap, 12, last sleep study Jan2013, sees Dr. 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dx polio age 69;   09-30-2018  per pt occasional gait issues and occasion fall   Presence of permanent cardiac pacemaker    Pseudoarthrosis of lumbar spine    Rotator cuff tear, left    S/P CABG x 2    1990 x2  and 2004  x2   Sinus node dysfunction (HCC)    2015--- sinus node arrest  with intact AV conduction   Squamous cell carcinoma of skin 06/14/2021   Mid Parietal Scalp (in situ) (tx p bx)   Type 2 diabetes mellitus (HCC)    followed by pcp    Tobacco History: Social History   Tobacco Use  Smoking Status Never  Smokeless Tobacco Never   Counseling given: Not Answered   Outpatient Medications Prior to Visit  Medication Sig Dispense Refill   amiodarone  (PACERONE ) 200 MG tablet Take 200 mg by mouth daily.     apixaban  (ELIQUIS ) 2.5 MG TABS tablet Take 1 tablet (2.5 mg total) by mouth 2 (two) times daily. 180 tablet 3   Baclofen  5 MG TABS Take 5 mg by mouth at bedtime. (Patient not taking: Reported on 08/30/2023)     carvedilol  (COREG ) 25 MG tablet TAKE 2 TABLETS (50 MG TOTAL) BY MOUTH 2 (TWO) TIMES DAILY WITH A MEAL. 360 tablet 2   Continuous  Glucose Receiver (DEXCOM G7 RECEIVER) DEVI SMARTSIG:1 Unit(s) Daily PRN     Continuous Glucose Sensor (DEXCOM G7 SENSOR) MISC 1 Units by Does not apply route.     Cyanocobalamin (VITAMIN B-12 PO) Take 1 tablet by mouth daily.     cyanocobalamin (VITAMIN B12) 1000 MCG tablet Take 1,000 mcg by mouth daily.     DULoxetine (CYMBALTA) 60 MG capsule Take 60 mg by mouth daily. (Patient not taking: Reported on 08/30/2023)     empagliflozin  (JARDIANCE ) 10 MG TABS tablet Take 1 tablet (10 mg total) by mouth daily before breakfast. 90 tablet 3   Evolocumab  (REPATHA  SURECLICK) 140 MG/ML SOAJ Inject 140 mg into the skin every 14 (fourteen) days. 6 mL 2   ferrous sulfate  325 (65 FE) MG tablet Take 325 mg by mouth daily at  6 (six) AM.     glipiZIDE  (GLUCOTROL ) 10 MG tablet Take 10 mg by mouth 2 (two) times daily. (Patient taking differently: Take 5 mg by mouth daily at 6 (six) AM.)     insulin  lispro (HUMALOG ) 100 UNIT/ML injection Inject 0-10 Units into the skin 2 (two) times daily as needed for high blood sugar. Per sliding scale.     JARDIANCE  10 MG TABS tablet Take 1 tablet by mouth once daily 90 tablet 0   Multiple Vitamin (MULTIVITAMIN WITH MINERALS) TABS tablet Take 1 tablet by mouth daily.      ONETOUCH ULTRA test strip 1 each by Other route as needed.     OZEMPIC, 0.25 OR 0.5 MG/DOSE, 2 MG/3ML SOPN Inject 0.5 mg into the skin once a week.     spironolactone  (ALDACTONE ) 25 MG tablet Take 12.5 mg by mouth daily.     torsemide  (DEMADEX ) 20 MG tablet Take 10 mg by mouth daily.     triamcinolone cream (KENALOG) 0.1 % Apply 1 Application topically 2 (two) times daily as needed (rough skin patches). (Patient not taking: Reported on 08/30/2023)     No facility-administered medications prior to visit.      Review of Systems  Review of Systems   Physical Exam  There were no vitals taken for this visit. Physical Exam  ***  Lab Results:  CBC    Component Value Date/Time   WBC 5.3 09/17/2023 1212    WBC 5.0 06/03/2023 0210   RBC 3.52 (L) 09/17/2023 1212   HGB 11.3 (L) 09/17/2023 1212   HGB 11.6 (L) 01/13/2019 1031   HCT 33.7 (L) 09/17/2023 1212   HCT 34.1 (L) 01/13/2019 1031   PLT 83 (L) 09/17/2023 1212   PLT 166 01/13/2019 1031   MCV 95.7 09/17/2023 1212   MCV 94 01/13/2019 1031   MCV 92 05/02/2012 1055   MCH 32.1 09/17/2023 1212   MCHC 33.5 09/17/2023 1212   RDW 13.9 09/17/2023 1212   RDW 12.3 01/13/2019 1031   RDW 13.5 05/02/2012 1055   LYMPHSABS 1.2 09/17/2023 1212   LYMPHSABS 1.2 05/02/2012 1055   MONOABS 0.5 09/17/2023 1212   MONOABS 0.6 05/02/2012 1055   EOSABS 0.2 09/17/2023 1212   EOSABS 0.1 05/02/2012 1055   BASOSABS 0.1 09/17/2023 1212   BASOSABS 0.1 05/02/2012 1055    BMET    Component Value Date/Time   NA 138 09/03/2023 1252   NA 134 01/11/2023 0918   NA 136 05/02/2012 1055   K 4.5 09/03/2023 1252   K 4.0 05/02/2012 1055   CL 107 09/03/2023 1252   CL 100 05/02/2012 1055   CO2 26 09/03/2023 1252   CO2 30 05/02/2012 1055   GLUCOSE 124 (H) 09/03/2023 1252   GLUCOSE 100 (H) 05/02/2012 1055   BUN 42 (H) 09/03/2023 1252   BUN 59 (H) 01/11/2023 0918   BUN 38 (H) 05/02/2012 1055   CREATININE 2.14 (H) 09/03/2023 1252   CREATININE 1.53 (H) 05/02/2012 1055   CALCIUM  9.1 09/03/2023 1252   CALCIUM  9.1 05/02/2012 1055   GFRNONAA 30 (L) 09/03/2023 1252   GFRAA 38 (L) 04/07/2019 0920    BNP    Component Value Date/Time   BNP 166.6 (H) 06/02/2023 1344    ProBNP No results found for: PROBNP  Imaging: No results found.   Assessment & Plan:   No problem-specific Assessment & Plan notes found for this encounter.   There are no diagnoses linked to this encounter.  Assessment and Plan  Assessment & Plan        Almarie LELON Ferrari, NP 10/01/2023

## 2023-10-02 ENCOUNTER — Ambulatory Visit (INDEPENDENT_AMBULATORY_CARE_PROVIDER_SITE_OTHER): Payer: Medicare HMO

## 2023-10-02 ENCOUNTER — Encounter (HOSPITAL_BASED_OUTPATIENT_CLINIC_OR_DEPARTMENT_OTHER): Payer: Self-pay | Admitting: Primary Care

## 2023-10-02 ENCOUNTER — Ambulatory Visit (HOSPITAL_BASED_OUTPATIENT_CLINIC_OR_DEPARTMENT_OTHER): Admitting: Primary Care

## 2023-10-02 VITALS — BP 125/73 | HR 82 | Ht 70.0 in | Wt 200.0 lb

## 2023-10-02 DIAGNOSIS — I495 Sick sinus syndrome: Secondary | ICD-10-CM

## 2023-10-02 DIAGNOSIS — G4733 Obstructive sleep apnea (adult) (pediatric): Secondary | ICD-10-CM | POA: Diagnosis not present

## 2023-10-02 NOTE — Patient Instructions (Signed)
  VISIT SUMMARY: You came in today for a follow-up after your sleep study. The study showed that you have mild sleep apnea with 11.9 apneic events per hour and a lowest oxygen saturation of 89%. We discussed your difficulties with using CPAP and explored alternative options for managing your sleep apnea.  YOUR PLAN: -MILD OBSTRUCTIVE SLEEP APNEA: Mild obstructive sleep apnea is a condition where your breathing stops and starts during sleep, causing poor sleep quality. To manage this, you should avoid sleeping on your back and try sleeping on your side or using a wedge pillow to elevate your head. Maintaining your current weight or losing about 10 pounds can also help. You might consider an over-the-counter oral appliance as an alternative to CPAP. CPAP therapy is not necessary unless your symptoms get worse or you decide you want to try it again. A surgical implant is not recommended for your mild condition.  INSTRUCTIONS: Please follow the recommendations for positional therapy and weight management. If your symptoms worsen or you decide you want to try CPAP again, please contact our office. We will not pursue surgical options at this time.  Follow-up  As needed

## 2023-10-03 LAB — CUP PACEART REMOTE DEVICE CHECK
Battery Impedance: 2015 Ohm
Battery Remaining Longevity: 31 mo
Battery Voltage: 2.74 V
Brady Statistic AP VP Percent: 100 %
Brady Statistic AP VS Percent: 0 %
Brady Statistic AS VP Percent: 0 %
Brady Statistic AS VS Percent: 0 %
Date Time Interrogation Session: 20250826081455
Implantable Lead Connection Status: 753985
Implantable Lead Connection Status: 753985
Implantable Lead Implant Date: 20080208
Implantable Lead Implant Date: 20140130
Implantable Lead Location: 753859
Implantable Lead Location: 753860
Implantable Lead Model: 5076
Implantable Lead Model: 5076
Implantable Pulse Generator Implant Date: 20140130
Lead Channel Impedance Value: 500 Ohm
Lead Channel Impedance Value: 651 Ohm
Lead Channel Pacing Threshold Amplitude: 0.75 V
Lead Channel Pacing Threshold Amplitude: 0.875 V
Lead Channel Pacing Threshold Pulse Width: 0.4 ms
Lead Channel Pacing Threshold Pulse Width: 0.4 ms
Lead Channel Setting Pacing Amplitude: 2 V
Lead Channel Setting Pacing Amplitude: 2.5 V
Lead Channel Setting Pacing Pulse Width: 0.4 ms
Lead Channel Setting Sensing Sensitivity: 2 mV
Zone Setting Status: 755011
Zone Setting Status: 755011

## 2023-10-04 ENCOUNTER — Ambulatory Visit: Payer: Self-pay | Admitting: Cardiovascular Disease

## 2023-10-10 ENCOUNTER — Ambulatory Visit

## 2023-10-10 VITALS — BP 160/84 | HR 65 | Temp 97.9°F | Resp 16 | Ht 70.0 in | Wt 199.0 lb

## 2023-10-10 DIAGNOSIS — N1832 Chronic kidney disease, stage 3b: Secondary | ICD-10-CM | POA: Diagnosis not present

## 2023-10-10 DIAGNOSIS — D508 Other iron deficiency anemias: Secondary | ICD-10-CM | POA: Diagnosis not present

## 2023-10-10 MED ORDER — IRON SUCROSE 20 MG/ML IV SOLN
200.0000 mg | Freq: Once | INTRAVENOUS | Status: AC
  Administered 2023-10-10: 200 mg via INTRAVENOUS
  Filled 2023-10-10: qty 10

## 2023-10-10 NOTE — Progress Notes (Signed)
 Diagnosis: Iron Deficiency Anemia  Provider:  Chilton Greathouse MD  Procedure: IV Push  IV Type: Peripheral, IV Location: L Antecubital  Venofer (Iron Sucrose), Dose: 200 mg  Post Infusion IV Care: Observation period completed and Peripheral IV Discontinued  Discharge: Condition: Good, Destination: Home . AVS Provided  Performed by:  Rico Ala, LPN

## 2023-10-10 NOTE — Patient Instructions (Signed)

## 2023-10-15 NOTE — Progress Notes (Signed)
 Pt was seen in office for this with Almarie Ferrari, NP on 10-02-23. NFN

## 2023-10-17 ENCOUNTER — Ambulatory Visit (INDEPENDENT_AMBULATORY_CARE_PROVIDER_SITE_OTHER)

## 2023-10-17 VITALS — BP 145/71 | HR 65 | Temp 98.0°F | Resp 18 | Ht 70.0 in | Wt 200.0 lb

## 2023-10-17 DIAGNOSIS — D508 Other iron deficiency anemias: Secondary | ICD-10-CM | POA: Diagnosis not present

## 2023-10-17 DIAGNOSIS — N1832 Chronic kidney disease, stage 3b: Secondary | ICD-10-CM

## 2023-10-17 MED ORDER — IRON SUCROSE 20 MG/ML IV SOLN
200.0000 mg | Freq: Once | INTRAVENOUS | Status: AC
  Administered 2023-10-17: 200 mg via INTRAVENOUS
  Filled 2023-10-17: qty 10

## 2023-10-17 NOTE — Progress Notes (Signed)
 Diagnosis: Iron  Deficiency Anemia  Provider:  Praveen Mannam MD  Procedure: IV Push  IV Type: Peripheral, IV Location: L Forearm  Venofer  (Iron  Sucrose), Dose: 200 mg  Post Infusion IV Care: Observation period completed  Discharge: Condition: Good, Destination: Home . AVS Declined  Performed by:  Talin Rozeboom, RN

## 2023-10-19 ENCOUNTER — Ambulatory Visit

## 2023-10-19 VITALS — BP 145/76 | HR 67 | Temp 97.9°F | Resp 16 | Ht 70.0 in | Wt 199.0 lb

## 2023-10-19 DIAGNOSIS — D508 Other iron deficiency anemias: Secondary | ICD-10-CM | POA: Diagnosis not present

## 2023-10-19 DIAGNOSIS — N1832 Chronic kidney disease, stage 3b: Secondary | ICD-10-CM | POA: Diagnosis not present

## 2023-10-19 MED ORDER — IRON SUCROSE 20 MG/ML IV SOLN
200.0000 mg | Freq: Once | INTRAVENOUS | Status: AC
  Administered 2023-10-19: 200 mg via INTRAVENOUS
  Filled 2023-10-19: qty 10

## 2023-10-19 NOTE — Progress Notes (Signed)
 Diagnosis: Iron Deficiency Anemia  Provider:  Chilton Greathouse MD  Procedure: IV Push  IV Type: Peripheral, IV Location: R Antecubital  Venofer (Iron Sucrose), Dose: 200 mg  Post Infusion IV Care: Observation period completed and Peripheral IV Discontinued  Discharge: Condition: Good, Destination: Home . AVS Provided  Performed by:  Rico Ala, LPN

## 2023-10-22 ENCOUNTER — Ambulatory Visit

## 2023-10-23 NOTE — Progress Notes (Signed)
 Remote PPM Transmission

## 2023-10-24 ENCOUNTER — Ambulatory Visit

## 2023-10-29 ENCOUNTER — Ambulatory Visit (INDEPENDENT_AMBULATORY_CARE_PROVIDER_SITE_OTHER)

## 2023-10-29 VITALS — BP 154/79 | HR 66 | Temp 97.8°F | Resp 20 | Ht 70.0 in | Wt 198.8 lb

## 2023-10-29 DIAGNOSIS — N1832 Chronic kidney disease, stage 3b: Secondary | ICD-10-CM

## 2023-10-29 DIAGNOSIS — D508 Other iron deficiency anemias: Secondary | ICD-10-CM | POA: Diagnosis not present

## 2023-10-29 MED ORDER — IRON SUCROSE 20 MG/ML IV SOLN
200.0000 mg | Freq: Once | INTRAVENOUS | Status: AC
  Administered 2023-10-29: 200 mg via INTRAVENOUS
  Filled 2023-10-29: qty 10

## 2023-10-29 NOTE — Progress Notes (Signed)
 Diagnosis: Iron  Deficiency Anemia  Provider:  Praveen Mannam MD  Procedure: IV Push  IV Type: Peripheral, IV Location: L Antecubital  Venofer  (Iron  Sucrose), Dose: 200 mg  Post Infusion IV Care: Observation period completed and Peripheral IV Discontinued  Discharge: Condition: Good, Destination: Home . AVS Declined  Performed by:  Maximiano JONELLE Pouch, LPN

## 2023-10-31 ENCOUNTER — Ambulatory Visit

## 2023-10-31 VITALS — BP 146/54 | HR 65 | Temp 98.1°F | Resp 16 | Ht 70.0 in | Wt 197.8 lb

## 2023-10-31 DIAGNOSIS — N1832 Chronic kidney disease, stage 3b: Secondary | ICD-10-CM | POA: Diagnosis not present

## 2023-10-31 DIAGNOSIS — D508 Other iron deficiency anemias: Secondary | ICD-10-CM | POA: Diagnosis not present

## 2023-10-31 MED ORDER — IRON SUCROSE 20 MG/ML IV SOLN
200.0000 mg | Freq: Once | INTRAVENOUS | Status: AC
  Administered 2023-10-31: 200 mg via INTRAVENOUS
  Filled 2023-10-31: qty 10

## 2023-10-31 NOTE — Progress Notes (Signed)
 Diagnosis: Iron Deficiency Anemia  Provider:  Chilton Greathouse MD  Procedure: IV Push  IV Type: Peripheral, IV Location: R Antecubital  Venofer (Iron Sucrose), Dose: 200 mg  Post Infusion IV Care: Observation period completed and Peripheral IV Discontinued  Discharge: Condition: Good, Destination: Home . AVS Provided  Performed by:  Rico Ala, LPN

## 2023-11-02 ENCOUNTER — Other Ambulatory Visit: Payer: Self-pay

## 2023-11-02 ENCOUNTER — Other Ambulatory Visit: Payer: Self-pay | Admitting: Cardiovascular Disease

## 2023-11-02 ENCOUNTER — Other Ambulatory Visit (HOSPITAL_COMMUNITY): Payer: Self-pay | Admitting: Cardiology

## 2023-11-06 ENCOUNTER — Other Ambulatory Visit: Payer: Self-pay

## 2023-11-06 ENCOUNTER — Other Ambulatory Visit (HOSPITAL_COMMUNITY): Payer: Self-pay

## 2023-11-07 ENCOUNTER — Other Ambulatory Visit (HOSPITAL_COMMUNITY): Payer: Self-pay

## 2023-11-20 ENCOUNTER — Emergency Department (HOSPITAL_BASED_OUTPATIENT_CLINIC_OR_DEPARTMENT_OTHER)

## 2023-11-20 ENCOUNTER — Other Ambulatory Visit: Payer: Self-pay

## 2023-11-20 ENCOUNTER — Other Ambulatory Visit: Payer: Self-pay | Admitting: Cardiovascular Disease

## 2023-11-20 ENCOUNTER — Encounter (HOSPITAL_BASED_OUTPATIENT_CLINIC_OR_DEPARTMENT_OTHER): Payer: Self-pay | Admitting: *Deleted

## 2023-11-20 ENCOUNTER — Emergency Department (HOSPITAL_BASED_OUTPATIENT_CLINIC_OR_DEPARTMENT_OTHER)
Admission: EM | Admit: 2023-11-20 | Discharge: 2023-11-20 | Disposition: A | Discharge status: Home / Self Care | Attending: Emergency Medicine | Admitting: Emergency Medicine

## 2023-11-20 DIAGNOSIS — Y9301 Activity, walking, marching and hiking: Secondary | ICD-10-CM | POA: Insufficient documentation

## 2023-11-20 DIAGNOSIS — R1031 Right lower quadrant pain: Secondary | ICD-10-CM | POA: Insufficient documentation

## 2023-11-20 DIAGNOSIS — W01198A Fall on same level from slipping, tripping and stumbling with subsequent striking against other object, initial encounter: Secondary | ICD-10-CM | POA: Diagnosis not present

## 2023-11-20 DIAGNOSIS — S0083XA Contusion of other part of head, initial encounter: Secondary | ICD-10-CM | POA: Insufficient documentation

## 2023-11-20 DIAGNOSIS — Z7984 Long term (current) use of oral hypoglycemic drugs: Secondary | ICD-10-CM | POA: Diagnosis not present

## 2023-11-20 DIAGNOSIS — D689 Coagulation defect, unspecified: Secondary | ICD-10-CM | POA: Insufficient documentation

## 2023-11-20 DIAGNOSIS — R1012 Left upper quadrant pain: Secondary | ICD-10-CM | POA: Insufficient documentation

## 2023-11-20 DIAGNOSIS — S51012A Laceration without foreign body of left elbow, initial encounter: Secondary | ICD-10-CM | POA: Diagnosis not present

## 2023-11-20 DIAGNOSIS — Z7901 Long term (current) use of anticoagulants: Secondary | ICD-10-CM

## 2023-11-20 DIAGNOSIS — Y92009 Unspecified place in unspecified non-institutional (private) residence as the place of occurrence of the external cause: Secondary | ICD-10-CM | POA: Insufficient documentation

## 2023-11-20 DIAGNOSIS — M542 Cervicalgia: Secondary | ICD-10-CM | POA: Insufficient documentation

## 2023-11-20 DIAGNOSIS — S59902A Unspecified injury of left elbow, initial encounter: Secondary | ICD-10-CM | POA: Diagnosis present

## 2023-11-20 DIAGNOSIS — R1032 Left lower quadrant pain: Secondary | ICD-10-CM | POA: Insufficient documentation

## 2023-11-20 DIAGNOSIS — R1011 Right upper quadrant pain: Secondary | ICD-10-CM | POA: Diagnosis not present

## 2023-11-20 DIAGNOSIS — W19XXXA Unspecified fall, initial encounter: Secondary | ICD-10-CM

## 2023-11-20 LAB — COMPREHENSIVE METABOLIC PANEL WITH GFR
ALT: 18 U/L (ref 0–44)
AST: 24 U/L (ref 15–41)
Albumin: 4.2 g/dL (ref 3.5–5.0)
Alkaline Phosphatase: 76 U/L (ref 38–126)
Anion gap: 10 (ref 5–15)
BUN: 44 mg/dL — ABNORMAL HIGH (ref 8–23)
CO2: 24 mmol/L (ref 22–32)
Calcium: 10.3 mg/dL (ref 8.9–10.3)
Chloride: 100 mmol/L (ref 98–111)
Creatinine, Ser: 2.11 mg/dL — ABNORMAL HIGH (ref 0.61–1.24)
GFR, Estimated: 31 mL/min — ABNORMAL LOW
Glucose, Bld: 135 mg/dL — ABNORMAL HIGH (ref 70–99)
Potassium: 4.8 mmol/L (ref 3.5–5.1)
Sodium: 134 mmol/L — ABNORMAL LOW (ref 135–145)
Total Bilirubin: 0.4 mg/dL (ref 0.0–1.2)
Total Protein: 8.6 g/dL — ABNORMAL HIGH (ref 6.5–8.1)

## 2023-11-20 LAB — CBC WITH DIFFERENTIAL/PLATELET
Abs Immature Granulocytes: 0.07 K/uL (ref 0.00–0.07)
Basophils Absolute: 0 K/uL (ref 0.0–0.1)
Basophils Relative: 0 %
Eosinophils Absolute: 0 K/uL (ref 0.0–0.5)
Eosinophils Relative: 0 %
HCT: 35.6 % — ABNORMAL LOW (ref 39.0–52.0)
Hemoglobin: 12 g/dL — ABNORMAL LOW (ref 13.0–17.0)
Immature Granulocytes: 1 %
Lymphocytes Relative: 14 %
Lymphs Abs: 1.3 K/uL (ref 0.7–4.0)
MCH: 32.4 pg (ref 26.0–34.0)
MCHC: 33.7 g/dL (ref 30.0–36.0)
MCV: 96.2 fL (ref 80.0–100.0)
Monocytes Absolute: 0.7 K/uL (ref 0.1–1.0)
Monocytes Relative: 7 %
Neutro Abs: 7.7 K/uL (ref 1.7–7.7)
Neutrophils Relative %: 78 %
Platelets: 111 K/uL — ABNORMAL LOW (ref 150–400)
RBC: 3.7 MIL/uL — ABNORMAL LOW (ref 4.22–5.81)
RDW: 12.8 % (ref 11.5–15.5)
WBC: 9.8 K/uL (ref 4.0–10.5)
nRBC: 0 % (ref 0.0–0.2)

## 2023-11-20 LAB — LIPASE, BLOOD: Lipase: 53 U/L — ABNORMAL HIGH (ref 11–51)

## 2023-11-20 MED ORDER — ACETAMINOPHEN 325 MG PO TABS
650.0000 mg | ORAL_TABLET | Freq: Once | ORAL | Status: AC
  Administered 2023-11-20: 650 mg via ORAL
  Filled 2023-11-20: qty 2

## 2023-11-20 MED ORDER — MORPHINE SULFATE (PF) 4 MG/ML IV SOLN
4.0000 mg | Freq: Once | INTRAVENOUS | Status: AC
  Administered 2023-11-20: 4 mg via INTRAVENOUS
  Filled 2023-11-20: qty 1

## 2023-11-20 MED ORDER — ONDANSETRON HCL 4 MG/2ML IJ SOLN
4.0000 mg | Freq: Once | INTRAMUSCULAR | Status: AC
  Administered 2023-11-20: 4 mg via INTRAVENOUS
  Filled 2023-11-20: qty 2

## 2023-11-20 NOTE — Discharge Instructions (Addendum)
 It was a pleasure taking care of you today.  As we talked about, please continue supportive care measures such as rest and ice to the small hematoma above the left eye.  Please use over-the-counter Tylenol  as needed for pain management.  Please follow-up with your primary care provider.  Please come back to the emergency department if symptoms worsen.

## 2023-11-20 NOTE — ED Triage Notes (Signed)
 Pt tripped and fell pta and and struck his head on concrete, left elbow injury (?skin tear, wrapped pta).  No LOC.  Pt is alert and oriented.

## 2023-11-20 NOTE — ED Notes (Signed)
 C-collar applied

## 2023-11-20 NOTE — ED Provider Notes (Signed)
 [place] EMERGENCY DEPARTMENT AT [facility] Provider Note   CSN: [id number] Arrival date & time: 11/20/23  2011     Patient presents with: Fall (eliquis )   Alan [name] is a 82 y.o. male who presents today after a fall at home.  Patient was brought in by family.  Patient relates that he was outside walking when he tripped on a hose and fell front first and attempted to catch himself with outstretched hands and hit his head on the cement pavement.  Patient denied any LOC.  Patient states that he laid on the pavement for approximately 10-20 minutes when a neighbor came to help him up.  Patient is currently complaining of neck pain and left elbow pain.  Patient states that he has a small skin tear to the left forearm where he tried to catch himself.  Patient states that he is currently on Eliquis  twice a day.  Patient denies lower extremity pain or injury.  Patient also relays a history of a neck fracture x 1 year ago.  {Add pertinent medical, surgical, social history, OB history to YEP:67052}  Fall Associated symptoms include headaches. Pertinent negatives include no chest pain and no shortness of breath.       Prior to Admission medications   Medication Sig Start Date End Date Taking? Authorizing Provider  amiodarone  (PACERONE ) 200 MG tablet Take 200 mg by mouth daily.    [provider]  apixaban  (ELIQUIS ) 2.5 MG TABS tablet Take 1 tablet (2.5 mg total) by mouth 2 (two) times daily. 06/05/23   [name], MD  Baclofen  5 MG TABS Take 5 mg by mouth at bedtime.    [provider]  carvedilol  (COREG ) 25 MG tablet TAKE 2 TABLETS (50 MG TOTAL) BY MOUTH 2 (TWO) TIMES DAILY WITH A MEAL. 09/27/23   [place], Harlene CHRISTELLA, FNP  Continuous Glucose Receiver (DEXCOM G7 RECEIVER) DEVI SMARTSIG:1 Unit(s) Daily PRN    [provider]  Continuous Glucose Sensor (DEXCOM G7 SENSOR) MISC 1 Units by Does not apply route. 05/23/23   [provider]   Cyanocobalamin  (VITAMIN B-12 PO) Take 1 tablet by mouth daily.    [provider]  cyanocobalamin  (VITAMIN B12) 1000 MCG tablet Take 1,000 mcg by mouth daily.    [provider]  DULoxetine (CYMBALTA) 60 MG capsule Take 60 mg by mouth daily.    [provider]  empagliflozin  (JARDIANCE ) 10 MG TABS tablet Take 1 tablet (10 mg total) by mouth daily before breakfast. 06/05/23   [name], MD  Evolocumab  (REPATHA  SURECLICK) 140 MG/ML SOAJ Inject 140 mg into the skin every 14 (fourteen) days. 06/05/23   [name], MD  ferrous sulfate  325 (65 FE) MG tablet Take 325 mg by mouth daily at 6 (six) AM.    [provider]  glipiZIDE  (GLUCOTROL ) 10 MG tablet Take 10 mg by mouth 2 (two) times daily. Patient taking differently: Take 5 mg by mouth daily at 6 (six) AM. 05/08/23   [provider]  insulin  lispro (HUMALOG ) 100 UNIT/ML injection Inject 0-10 Units into the skin 2 (two) times daily as needed for high blood sugar. Per sliding scale.    [provider]  JARDIANCE  10 MG TABS tablet Take 1 tablet by mouth once daily 05/23/23   McLean, Dalton S, MD  Multiple Vitamin (MULTIVITAMIN WITH MINERALS) TABS tablet Take 1 tablet by mouth daily.     [provider]  [facility] ULTRA test strip 1 each  by Other route as needed. 10/11/22   [provider]  spironolactone  (ALDACTONE ) 25 MG tablet Take 12.5 mg by mouth daily.    [provider]  torsemide  (DEMADEX ) 20 MG tablet Take 0.5 tablets (10 mg total) by mouth every other day. PLEASE SCHEDULE APPOINTMENT FOR MORE REFILLS 11/02/23   [name], MD  triamcinolone cream (KENALOG) 0.1 % Apply 1 Application topically 2 (two) times daily as needed (rough skin patches).    [provider]    Allergies: Patient has no known allergies.    Review of Systems  Eyes:  Negative for visual disturbance.  Respiratory:  Negative for chest tightness and shortness of breath.    Cardiovascular:  Negative for chest pain.  Musculoskeletal:  Positive for neck pain.  Skin:  Positive for wound.  Neurological:  Positive for headaches. Negative for dizziness, weakness and light-headedness.    Updated Vital Signs BP (!) 160/63   Pulse 60   Temp 98.4 F (36.9 C) (Oral)   Resp 20   SpO2 96%   Physical Exam Constitutional:      General: He is in acute distress.     Appearance: Normal appearance.  HENT:     Head: Normocephalic. Contusion present.     Jaw: No tenderness or pain on movement.      Comments: Patient presents with a small hematoma just above his supraorbital ridge that is approximately 1.5 inches in length.  Hematoma is tender upon palpation.  No associated laceration is noted.    Nose: Nose normal.  Eyes:     General: Lids are normal. Vision grossly intact. Gaze aligned appropriately.     Extraocular Movements: Extraocular movements intact.     Conjunctiva/sclera: Conjunctivae normal.     Comments: No visual changes.  Neck:     Comments: Patient endorses pain upon movement prior to being placed in a c-collar in triage. Cardiovascular:     Rate and Rhythm: Normal rate and regular rhythm.     Pulses:          Radial pulses are 2+ on the right side.  Pulmonary:     Effort: Pulmonary effort is normal.     Breath sounds: Normal breath sounds.  Chest:     Chest wall: No deformity, tenderness or crepitus.  Abdominal:     General: Bowel sounds are normal. There is distension.     Palpations: Abdomen is soft.     Tenderness: There is generalized abdominal tenderness and tenderness in the right upper quadrant, right lower quadrant, left upper quadrant and left lower quadrant. There is no guarding.     Comments: Upon physical exam patient states pain upon palpation to the abdomen -patient relates that the pain is generalized and he cannot pinpoint it to a location.  Patient denies abdominal pain prior to the fall.  There is no ecchymosis noted.   Musculoskeletal:     Cervical back: Pain with movement present.  Skin:    General: Skin is warm and dry.     Capillary Refill: Capillary refill takes less than 2 seconds.     Findings: Wound present.         Comments: Small skin tear noted to the left elbow.  Neurological:     General: No focal deficit present.     Mental Status: He is alert and oriented to person, place, and time.     GCS: GCS eye subscore is 4. GCS verbal subscore is 5. GCS motor subscore  is 6.     Sensory: Sensation is intact.     Motor: Motor function is intact.     (all labs ordered are listed, but only abnormal results are displayed) Labs Reviewed  CBC WITH DIFFERENTIAL/PLATELET - Abnormal; Notable for the following components:      Result Value   RBC 3.70 (*)    Hemoglobin 12.0 (*)    HCT 35.6 (*)    Platelets 111 (*)    All other components within normal limits  COMPREHENSIVE METABOLIC PANEL WITH GFR - Abnormal; Notable for the following components:   Sodium 134 (*)    Glucose, Bld 135 (*)    BUN 44 (*)    Creatinine, Ser 2.11 (*)    Total Protein 8.6 (*)    GFR, Estimated 31 (*)    All other components within normal limits  LIPASE, BLOOD - Abnormal; Notable for the following components:   Lipase 53 (*)    All other components within normal limits    EKG: None  Radiology: CT Cervical Spine Wo Contrast Result Date: 11/20/2023 EXAM: CT CERVICAL SPINE WITHOUT CONTRAST 11/20/2023 09:12:56 PM TECHNIQUE: CT of the cervical spine was performed without the administration of intravenous contrast. Multiplanar reformatted images are provided for review. Automated exposure control, iterative reconstruction, and/or weight based adjustment of the mA/kV was utilized to reduce the radiation dose to as low as reasonably achievable. COMPARISON: CT cervical spine dated 09/05/2022. CLINICAL HISTORY: Neck trauma (Age >= 65y). Pt tripped and fell pta and struck his head on concrete, left elbow injury (?skin tear,  wrapped pta). No LOC. Pt is alert and oriented. 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 Stable grade 1 anterolisthesis of C3 on C4, C4 on C5, C5 on C6. No acute fracture or traumatic malalignment. DEGENERATIVE CHANGES: Multilevel moderate-to-severe degenerative changes of the spine. Moderate to severe right C3-C4, C4-C5, C5-C6 osseous neural foraminal stenosis. No severe osseous central canal stenosis. SOFT TISSUES: No prevertebral soft tissue swelling. VASCULATURE: Atherosclerotic plaque of the carotid arteries within the neck. IMPRESSION: 1. No acute cervical spine abnormality related to reported neck trauma. 2. Degenerative changes with Moderate to severe right C3-C4, C4-C5, C5-C6 osseous neural foraminal stenosis. Electronically signed by: Kate Plummer MD 11/20/2023 09:33 PM EDT RP Workstation: [id number]   CT CHEST ABDOMEN PELVIS WO CONTRAST Result Date: 11/20/2023 EXAM: CT CHEST, ABDOMEN AND PELVIS WITHOUT CONTRAST 11/20/2023 09:12:56 PM TECHNIQUE: CT of the chest, abdomen and pelvis was performed without the administration of intravenous contrast. Multiplanar reformatted images are provided for review. Automated exposure control, iterative reconstruction, and/or weight based adjustment of the mA/kV was utilized to reduce the radiation dose to as low as reasonably achievable. COMPARISON: CT chest abdomen and pelvis 09/05/2022. CLINICAL HISTORY: Polytrauma, blunt. Pt tripped and fell pta and struck his head on concrete, left elbow injury (?skin tear, wrapped pta). No LOC. Pt is alert and oriented. FINDINGS: CHEST: MEDIASTINUM AND LYMPH NODES: Heart is mildly enlarged. Severe atherosclerotic plaque. Coronary artery calcification. Coronary artery bypass graft. The central airways are clear. No gross hilar adenopathy with limited evaluation on this noncontrast study. No mediastinal or axillary lymphadenopathy. LUNGS AND PLEURA: Slightly limited evaluation  of the lungs due to motion artifact. No focal consolidation or pulmonary edema. No pleural effusion or pneumothorax. ABDOMEN AND PELVIS: LIVER: The liver is unremarkable. GALLBLADDER AND BILE DUCTS: Gallbladder is unremarkable. No biliary ductal dilatation. SPLEEN: No acute abnormality. PANCREAS: No acute abnormality. ADRENAL GLANDS: No acute abnormality. KIDNEYS,  URETERS AND BLADDER: No stones in the kidneys or ureters. No hydronephrosis. No perinephric or periureteral stranding. Urinary bladder is unremarkable. GI AND BOWEL: Stomach demonstrates no acute abnormality. Colonic diverticulosis. No small or large bowel wall thickening or dilatation. The appendix is not definitely identified with no inflammatory changes in the right lower quadrant to suggest acute appendicitis. REPRODUCTIVE ORGANS: Prostate is enlarged measuring up to 5.1 cm. PERITONEUM AND RETROPERITONEUM: No ascites. No free air. VASCULATURE: Aorta is normal in caliber. ABDOMINAL AND PELVIS LYMPH NODES: No lymphadenopathy. BONES AND SOFT TISSUES: Left chest wall pacemaker. Bilateral gynecomastia. Old healed rib fractures. No acute displaced rib fracture. No acute displaced sternal fracture. Intact sternotomy wires. Chronic stable T8 superior endplate compression fracture. L2-L5 surgical hardware fusion. No acute displaced fracture of the thoracolumbar spine. Multilevel severe degenerative changes of the spine. Severe degenerative changes of the left shoulder. Moderate degenerative changes of the right shoulder. No acute displaced fracture or dislocation of either hips. No acute displaced fracture or diastasis of the bones of the pelvis. Right sacral iliac joint surgical hardware fusion. IMPRESSION: 1. No acute traumatic injury identified in the chest, abdomen, or pelvis. 2. No acute displaced thoracolumbar spine or pelvic/hip fractures. 3. Atherosclerotic plaque. Electronically signed by: Morgane Naveau MD 11/20/2023 09:30 PM EDT RP Workstation:  [id number]   CT Head Wo Contrast Result Date: 11/20/2023 EXAM: CT HEAD WITHOUT CONTRAST 11/20/2023 09:12:56 PM TECHNIQUE: CT of the head was performed without the administration of intravenous contrast. Automated exposure control, iterative reconstruction, and/or weight based adjustment of the mA/kV was utilized to reduce the radiation dose to as low as reasonably achievable. COMPARISON: CT head 09/05/22 CLINICAL HISTORY: Head trauma, moderate-severe; fall, anticoagulation. Pt tripped and fell pta and struck his head on concrete, left elbow injury (?skin tear, wrapped pta). No LOC. Pt is alert and oriented. FINDINGS: BRAIN AND VENTRICLES: No acute hemorrhage. No evidence of acute infarct. No hydrocephalus. No extra-axial collection. No mass effect or midline shift. Patchy and confluent areas of decreased attenuation are noted throughout the deep and periventricular white matter of the cerebral hemispheres bilaterally suggestive of chronic microvascular ischemic changes. Diffuse cerebral volume loss. Atherosclerotic calcifications are present within the cavernous internal carotid arteries. ORBITS: No acute abnormality. SINUSES: No acute abnormality. SOFT TISSUES AND SKULL: Left frontal scalp 8 mm hematoma. No skull fracture. IMPRESSION: 1. No acute intracranial abnormality. Electronically signed by: Morgane Naveau MD 11/20/2023 09:23 PM EDT RP Workstation: [id number]   DG Elbow Complete Left Result Date: 11/20/2023 EXAM: 3 VIEW(S) XRAY OF THE LEFT ELBOW COMPARISON: None available. CLINICAL HISTORY: fall. Pt tripped and fell pta and and struck his head on concrete, left elbow injury (?skin tear, wrapped pta). No LOC. Pt is alert and oriented. FINDINGS: BONES AND JOINTS: Olecranon enthesopathy. No acute fracture. No focal osseous lesion. No joint dislocation. No joint effusion. SOFT TISSUES: Vascular calcifications. IMPRESSION: 1. No acute abnormality Electronically signed by: Morgane Naveau MD 11/20/2023 09:15  PM EDT RP Workstation: [id number]   DG Wrist Complete Left Result Date: 11/20/2023 CLINICAL DATA:  Fall, pain EXAM: LEFT WRIST - COMPLETE 3+ VIEW COMPARISON:  None Available. 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 No acute bony abnormality. Specifically, no fracture, subluxation, or dislocation. Vascular calcifications. Soft tissues are intact. IMPRESSION: No acute bony abnormality. Electronically Signed   By: Franky Crease M.D.   On: 11/20/2023 21:14    {Document cardiac monitor, telemetry assessment procedure when appropriate:32947} Procedures   Medications Ordered in the ED  morphine  (PF)  4 MG/ML injection 4 mg (4 mg Intravenous Given 11/20/23 2115)  ondansetron  (ZOFRAN ) injection 4 mg (4 mg Intravenous Given 11/20/23 2115)  ondansetron  (ZOFRAN ) injection 4 mg (4 mg Intravenous Given 11/20/23 2225)  acetaminophen  (TYLENOL ) tablet 650 mg (650 mg Oral Given 11/20/23 2329)    Clinical Course as of 11/20/23 2356  Tue Nov 20, 2023  2122 CT Head Wo Contrast [ML]  2346 Lipase(!): 53 [ML]    Clinical Course User Index [ML] Willma Duwaine CROME, PA   {Click here for ABCD2, HEART and other calculators REFRESH Note before signing:1}                              Medical Decision Making Risk Prescription drug management.     27 yom presents to the ED for concern of traumatic fall, this involves an extensive number of treatment options, and is a complaint that carries with it a high risk of complications and morbidity.  The differential diagnosis includes fracture,    Co morbidities that complicate the patient evaluation  Chronic coagulopathy    Additional history obtained:  Additional history obtained from Family and Outside Medical Records family who are good historians are present for exam.  External records from outside source obtained and reviewed including past medical history, medications, allergies.   Lab Tests:  I Ordered, and personally  interpreted labs.  The pertinent results include: Hemoglobin 12 RBC 3.7 platelets 111 history of chronic anemia.  Creatinine 2.11 history of same.   Imaging Studies ordered:  I ordered imaging studies including CT cervical spine without contrast, CT chest abdomen pelvis without contrast, CT head without contrast, elbow series on the left, wrist series on the left. I independently visualized and interpreted imaging which showed no significant abnormality. I agree with the radiologist interpretation   Cardiac Monitoring:  The patient was maintained on a cardiac monitor.  I personally viewed and interpreted the cardiac monitored which showed an underlying rhythm of: A paced rhythm as patient has an implanted pacemaker.  Medicines ordered and prescription drug management:  I ordered medication including morphine  4 mg and Tylenol  650 mg for pain.  Zofran  for a total of 8 mg was given for nausea and vomiting. Reevaluation of the patient after these medicines showed that the patient improved I have reviewed the patients home medicines and have made adjustments as needed   Test Considered:  No additional tests were considered.   Critical Interventions:  N/A   Consultations Obtained:     Problem List / ED Course:  Traumatic fall resulting in neck pain, abdominal pain, left wrist and elbow pain.  Patient also presented with a small skin tear to the left elbow.   Reevaluation:  After the interventions noted above, I reevaluated the patient and found that they have :improved   Social Determinants of Health:  N/A  Dispostion:  After consideration of the diagnostic results and the patients response to treatment, I feel that the patent would benefit from supportive care measures at home.  All labs and imaging results were discussed with patient.  It was discussed with patient to continue with supportive care measures at home and utilize over-the-counter analgesics as needed.   Patient and patient spouse was educated on concussion warning signs and when to seek care at the emergency department.  Patient is encouraged to follow-up with his primary care provider.  Patient was educated on how to care for skin tear and  hematoma to keep it clean and promote healing. {Document critical care time when appropriate  Document review of labs and clinical decision tools ie CHADS2VASC2, etc  Document your independent review of radiology images and any outside records  Document your discussion with family members, caretakers and with consultants  Document social determinants of health affecting pt's care  Document your decision making why or why not admission, treatments were needed:32947:::1}   Final diagnoses:  Fall, initial encounter  Chronic anticoagulation    ED Discharge Orders     None

## 2023-11-20 NOTE — ED Notes (Signed)
 Attempted to medicate pt; xray at bedside. Pt then transported to CT. Will obtain PIV access, labs, EKG, and give meds when pt returns to room.

## 2023-11-23 ENCOUNTER — Emergency Department (HOSPITAL_BASED_OUTPATIENT_CLINIC_OR_DEPARTMENT_OTHER)
Admission: EM | Admit: 2023-11-23 | Discharge: 2023-11-23 | Disposition: A | Discharge status: Home / Self Care | Attending: Emergency Medicine | Admitting: Emergency Medicine

## 2023-11-23 DIAGNOSIS — L7622 Postprocedural hemorrhage and hematoma of skin and subcutaneous tissue following other procedure: Secondary | ICD-10-CM | POA: Diagnosis present

## 2023-11-23 DIAGNOSIS — Z7901 Long term (current) use of anticoagulants: Secondary | ICD-10-CM | POA: Insufficient documentation

## 2023-11-23 DIAGNOSIS — T148XXA Other injury of unspecified body region, initial encounter: Secondary | ICD-10-CM

## 2023-11-23 NOTE — Discharge Instructions (Signed)
 Keep the Coban dressing in place for 2 days that is all day tomorrow and all day the next day.  Then you can remove it and reapply antibiotic ointment and redress with nonadherent dressing and Coban.  Make an follow-up with his doctor.  Return for any new or worse symptoms

## 2023-11-23 NOTE — ED Triage Notes (Signed)
 Fall on 10/14 with several skin tears and head injury. Returns today for left arm/elbow injury bleeding all day. Currently on eliquis . Unsure if he took dose today. Denies subsequent fall.

## 2023-11-23 NOTE — ED Provider Notes (Signed)
 [place] EMERGENCY DEPARTMENT AT [facility] Provider Note   CSN: [id number] Arrival date & time: 11/23/23  1925     Patient presents with: No chief complaint on file.   Alan [name] is a 83 y.o. male.   Patient seen October 14 following a fall head large skin tear around the left elbow.  And also around the wrist.  Patient came back tonight because bandage came off around the elbow and it was doing extensive bleeding.  Patient did have CT head neck chest abdomen pelvis and did have x-ray of the left elbow that was negative for any injuries.  Patient is on Eliquis .  No new fall.       Prior to Admission medications   Medication Sig Start Date End Date Taking? Authorizing Provider  amiodarone  (PACERONE ) 200 MG tablet Take 200 mg by mouth daily.    [provider]  apixaban  (ELIQUIS ) 2.5 MG TABS tablet Take 1 tablet (2.5 mg total) by mouth 2 (two) times daily. 06/05/23   [name], MD  Baclofen  5 MG TABS Take 5 mg by mouth at bedtime.    [provider]  carvedilol  (COREG ) 25 MG tablet TAKE 2 TABLETS (50 MG TOTAL) BY MOUTH 2 (TWO) TIMES DAILY WITH A MEAL. 09/27/23   [place], Harlene CHRISTELLA, FNP  Continuous Glucose Receiver (DEXCOM G7 RECEIVER) DEVI SMARTSIG:1 Unit(s) Daily PRN    [provider]  Continuous Glucose Sensor (DEXCOM G7 SENSOR) MISC 1 Units by Does not apply route. 05/23/23   [provider]  Cyanocobalamin  (VITAMIN B-12 PO) Take 1 tablet by mouth daily.    [provider]  cyanocobalamin  (VITAMIN B12) 1000 MCG tablet Take 1,000 mcg by mouth daily.    [provider]  DULoxetine (CYMBALTA) 60 MG capsule Take 60 mg by mouth daily.    [provider]  empagliflozin  (JARDIANCE ) 10 MG TABS tablet Take 1 tablet (10 mg total) by mouth daily before breakfast. 06/05/23   [name], MD  Evolocumab  (REPATHA  SURECLICK) 140 MG/ML SOAJ Inject 140 mg into the skin every 14 (fourteen) days. 06/05/23   [name], MD  ferrous sulfate  325 (65 FE) MG tablet Take 325 mg by mouth daily at 6 (six) AM.    [provider]  glipiZIDE  (GLUCOTROL ) 10 MG tablet Take 10 mg by mouth 2 (two) times daily. Patient taking differently: Take 5 mg by mouth daily at 6 (six) AM. 05/08/23   [provider]  insulin  lispro (HUMALOG ) 100 UNIT/ML injection Inject 0-10 Units into the skin 2 (two) times daily as needed for high blood sugar. Per sliding scale.    [provider]  JARDIANCE  10 MG TABS tablet Take 1 tablet by mouth once daily 05/23/23   McLean, Dalton S, MD  Multiple Vitamin (MULTIVITAMIN WITH MINERALS) TABS tablet Take 1 tablet by mouth daily.     [provider]  [facility] ULTRA test strip 1 each by Other route as needed. 10/11/22   [provider]  spironolactone  (ALDACTONE ) 25 MG tablet Take 12.5 mg by mouth daily.    [provider]  torsemide  (DEMADEX ) 20 MG tablet Take 0.5 tablets (10 mg total) by mouth every other day. PLEASE SCHEDULE APPOINTMENT FOR MORE REFILLS 11/02/23   [name], MD  triamcinolone cream (KENALOG) 0.1 % Apply 1 Application topically 2 (two) times daily as needed (rough skin patches).    [provider]    Allergies: Patient has no known  allergies.    Review of Systems  Constitutional:  Negative for chills and fever.  HENT:  Negative for ear pain and sore throat.   Eyes:  Negative for pain and visual disturbance.  Respiratory:  Negative for cough and shortness of breath.   Cardiovascular:  Negative for chest pain and palpitations.  Gastrointestinal:  Negative for abdominal pain and vomiting.  Genitourinary:  Negative for dysuria and hematuria.  Musculoskeletal:  Negative for arthralgias and back pain.  Skin:  Positive for wound. Negative for color change and rash.  Neurological:  Negative for seizures and syncope.  Hematological:  Bruises/bleeds easily.  All other systems reviewed and are negative.   Updated  Vital Signs BP (!) 146/76   Pulse 80   Temp 97.7 F (36.5 C)   Resp 16   SpO2 100%   Physical Exam Vitals and nursing note reviewed.  Constitutional:      General: He is not in acute distress.    Appearance: Normal appearance. He is well-developed.  HENT:     Head: Normocephalic.     Comments: Bruising around the left eye and abrasion to the left forehead area Eyes:     Extraocular Movements: Extraocular movements intact.     Conjunctiva/sclera: Conjunctivae normal.     Pupils: Pupils are equal, round, and reactive to light.  Cardiovascular:     Rate and Rhythm: Normal rate and regular rhythm.     Heart sounds: No murmur heard. Pulmonary:     Effort: Pulmonary effort is normal. No respiratory distress.     Breath sounds: Normal breath sounds.  Abdominal:     Palpations: Abdomen is soft.     Tenderness: There is no abdominal tenderness.  Musculoskeletal:        General: Signs of injury present. No swelling or deformity.     Cervical back: Neck supple.     Comments: Left wrist skin tear not examined.  Wrapped with an Ace wrap and no bleeding from that.  The left elbow area of dressing taken down.  Skin tear without a skin flap able to cover it measuring about 5 x 4 cm.  No active bleeding currently.  Skin:    General: Skin is warm and dry.     Capillary Refill: Capillary refill takes less than 2 seconds.  Neurological:     General: No focal deficit present.     Mental Status: He is alert. Mental status is at baseline.     Cranial Nerves: No cranial nerve deficit.     Sensory: No sensory deficit.     Motor: No weakness.  Psychiatric:        Mood and Affect: Mood normal.     (all labs ordered are listed, but only abnormal results are displayed) Labs Reviewed - No data to display  EKG: None  Radiology: No results found.   Procedures   Medications Ordered in the ED - No data to display                                  Medical Decision Making  Skin tear  around the left elbow cleaned dressed with quick clot and then antibiotic ointment nonadherent dressing wrapped with gauze and then Coban.  Final diagnoses:  Bleeding from wound    ED Discharge Orders     None          Geraldene Hamilton, MD 11/23/23 2227

## 2023-11-26 ENCOUNTER — Other Ambulatory Visit: Payer: Self-pay

## 2023-11-26 DIAGNOSIS — D472 Monoclonal gammopathy: Secondary | ICD-10-CM

## 2023-11-27 ENCOUNTER — Inpatient Hospital Stay: Attending: Hematology

## 2023-11-27 ENCOUNTER — Inpatient Hospital Stay: Admitting: Hematology

## 2023-11-27 VITALS — BP 155/69 | HR 83 | Temp 97.5°F | Resp 20 | Wt 195.5 lb

## 2023-11-27 DIAGNOSIS — G14 Postpolio syndrome: Secondary | ICD-10-CM | POA: Diagnosis not present

## 2023-11-27 DIAGNOSIS — D472 Monoclonal gammopathy: Secondary | ICD-10-CM | POA: Insufficient documentation

## 2023-11-27 DIAGNOSIS — D649 Anemia, unspecified: Secondary | ICD-10-CM

## 2023-11-27 DIAGNOSIS — D631 Anemia in chronic kidney disease: Secondary | ICD-10-CM | POA: Insufficient documentation

## 2023-11-27 DIAGNOSIS — I130 Hypertensive heart and chronic kidney disease with heart failure and stage 1 through stage 4 chronic kidney disease, or unspecified chronic kidney disease: Secondary | ICD-10-CM | POA: Insufficient documentation

## 2023-11-27 DIAGNOSIS — I255 Ischemic cardiomyopathy: Secondary | ICD-10-CM | POA: Diagnosis not present

## 2023-11-27 DIAGNOSIS — I480 Paroxysmal atrial fibrillation: Secondary | ICD-10-CM | POA: Diagnosis not present

## 2023-11-27 DIAGNOSIS — N183 Chronic kidney disease, stage 3 unspecified: Secondary | ICD-10-CM | POA: Diagnosis not present

## 2023-11-27 DIAGNOSIS — Z7901 Long term (current) use of anticoagulants: Secondary | ICD-10-CM | POA: Insufficient documentation

## 2023-11-27 DIAGNOSIS — I2510 Atherosclerotic heart disease of native coronary artery without angina pectoris: Secondary | ICD-10-CM | POA: Diagnosis not present

## 2023-11-27 DIAGNOSIS — E1122 Type 2 diabetes mellitus with diabetic chronic kidney disease: Secondary | ICD-10-CM | POA: Insufficient documentation

## 2023-11-27 LAB — CMP (CANCER CENTER ONLY)
ALT: 17 U/L (ref 0–44)
AST: 19 U/L (ref 15–41)
Albumin: 3.7 g/dL (ref 3.5–5.0)
Alkaline Phosphatase: 68 U/L (ref 38–126)
Anion gap: 6 (ref 5–15)
BUN: 39 mg/dL — ABNORMAL HIGH (ref 8–23)
CO2: 25 mmol/L (ref 22–32)
Calcium: 9.3 mg/dL (ref 8.9–10.3)
Chloride: 105 mmol/L (ref 98–111)
Creatinine: 1.93 mg/dL — ABNORMAL HIGH (ref 0.61–1.24)
GFR, Estimated: 34 mL/min — ABNORMAL LOW (ref >60)
Glucose, Bld: 126 mg/dL — ABNORMAL HIGH (ref 70–99)
Potassium: 4.2 mmol/L (ref 3.5–5.1)
Sodium: 136 mmol/L (ref 135–145)
Total Bilirubin: 0.4 mg/dL (ref 0.0–1.2)
Total Protein: 7.9 g/dL (ref 6.5–8.1)

## 2023-11-27 LAB — CBC WITH DIFFERENTIAL (CANCER CENTER ONLY)
Abs Immature Granulocytes: 0.03 K/uL (ref 0.00–0.07)
Basophils Absolute: 0.1 K/uL (ref 0.0–0.1)
Basophils Relative: 1 %
Eosinophils Absolute: 0.2 K/uL (ref 0.0–0.5)
Eosinophils Relative: 3 %
HCT: 35.1 % — ABNORMAL LOW (ref 39.0–52.0)
Hemoglobin: 11.9 g/dL — ABNORMAL LOW (ref 13.0–17.0)
Immature Granulocytes: 1 %
Lymphocytes Relative: 18 %
Lymphs Abs: 1.2 K/uL (ref 0.7–4.0)
MCH: 32.5 pg (ref 26.0–34.0)
MCHC: 33.9 g/dL (ref 30.0–36.0)
MCV: 95.9 fL (ref 80.0–100.0)
Monocytes Absolute: 0.6 K/uL (ref 0.1–1.0)
Monocytes Relative: 8 %
Neutro Abs: 4.5 K/uL (ref 1.7–7.7)
Neutrophils Relative %: 69 %
Platelet Count: 109 K/uL — ABNORMAL LOW (ref 150–400)
RBC: 3.66 MIL/uL — ABNORMAL LOW (ref 4.22–5.81)
RDW: 12.5 % (ref 11.5–15.5)
WBC Count: 6.6 K/uL (ref 4.0–10.5)
nRBC: 0 % (ref 0.0–0.2)

## 2023-11-27 LAB — LACTATE DEHYDROGENASE: LDH: 122 U/L (ref 98–192)

## 2023-11-27 LAB — SEDIMENTATION RATE: Sed Rate: 45 mm/h — ABNORMAL HIGH (ref 0–16)

## 2023-11-27 NOTE — Progress Notes (Signed)
 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 male who has been referred to us  by Dr  Ezra Rolan for evaluation and management of monoclonal paraproteinemia.   Patient has a history of multiple medical comorbidities including coronary artery disease status post CABG, ischemic cardiomyopathy, obstructive sleep apnea on CPAP, postpolio lower extremity weakness, chronic kidney disease, hypertension, diabetes, atrial fibrillation on carvedilol  and amiodarone  and cutaneous squamous cell carcinomas.   She was admitted to the hospital on 02/02/2021 for congestive heart failure that was thought to be likely related to decrease in the dose of his outpatient diuretics in the context of worsening renal function.  Patient had had increasing LE edema and a 20 pound weight gain on admission.   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 Kappa lambda free light chains were not significantly elevated.  With kappa free light chain of 18.9 and lambda free light chain of 3.9 mg/L Immunofixation of the urine shows a faint band of monoclonal protein.   Patient notes that he is also being referred to nephrology for worsening renal function.   Patient notes no focal new bone pains.  INTERVAL HISTORY:  Alan [name] is a 83  y.o. 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 He is here with his wife today.  Last seen 09/17/2023 and was doing well, noting that he'd been seen in the ED at the end of April for chest pain.   Today, he reports that he was seen in the ED on 10/14 for a fall after becoming tangled in a hose and tripping. He was evaluated with multiple imaging. His wife says that he does use a walker and a cane, uses walker inside, keeps cane in truck but does not often use this unless walking far. Additionally, she says that falls have been occurring more often. Hx of Post-Polio Syndrome. He says that he has not been to PT. Denies new back pain. Subsequently seen 10/17 with bleeding and was told to hold his Eliquis  due to continuous bleeding.   He states that when taking PO Iron , he does experience constipation and GI upset. Denies black/bloody stools or abdominal pain.    MEDICAL HISTORY:  Past Medical History:  Diagnosis Date   Anticoagulant long-term use    Arthritis    Arthropathy of left shoulder    Atypical mole    Biceps tendonitis, left    Bursitis of left shoulder    Cardiac pacemaker in situ    first insertion 2008;  genertor and new lead change 03-08-2012;   [employer]   CHB (complete heart block) (HCC)    s/p  PPM 2008   Chronic back pain    CKD (chronic kidney disease), stage [facility])    Coronary artery disease cardiologist--- dr croitoru   hx  CABG x2  1990 and re-do 2004;  multiple PCI to RCA 1990  to 2004;    cardiac cath 2012  occluded LAD and RCA with patent grafts   Difficult intubation     Dr. Francyne said that it was difficulty to get the tube in.   History of coronary angioplasty    multiple PCI to RCA ,  1990 to 2004   History of GI bleed followed by GI-- [name] @ [facility] [place]   01/ 2020  upper and lower GI bleed ;  s/p  EGD with cautery of jejunal bleed and blood transfusion's   History of kidney stones     Hx of echocardiogram 06/29/2008   EF 45-50%    Hypertension    Ischemic cardiomyopathy    previously reported , ef 40-45%;  2010--- ef 45-50%;   last echo 2017 55-60%   Mixed hyperlipidemia    Nocturia    OSA on CPAP    cpap, 12, last sleep study Jan2013, sees Dr. 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[facility])    dx polio age 56;   09-30-2018  per pt occasional gait issues and occasion fall   Presence of permanent cardiac pacemaker    Pseudoarthrosis of lumbar spine    Rotator cuff tear, left    S/P CABG x 2    1990 x2  and 2004  x2   Sinus node dysfunction (HCC)    2015--- sinus node arrest  with intact AV conduction   Squamous cell carcinoma of skin 06/14/2021   Mid Parietal Scalp (in situ) (tx p bx)   Type 2 diabetes mellitus (HCC)    followed by pcp    SURGICAL HISTORY: Past Surgical History:  Procedure Laterality Date   APPENDECTOMY  child   APPLICATION OF INTRAOPERATIVE CT SCAN N/A 07/20/2020   Procedure: APPLICATION OF INTRAOPERATIVE CT SCAN;  Surgeon: [name], [name], DO;  Location: [facility] OR;  Service: Neurosurgery;  Laterality: N/A;   BLEPHAROPLASTY Bilateral 12/2014   upper eyelid   CARDIAC PACEMAKER PLACEMENT  2008   CARDIOVERSION N/A 01/15/2019   Procedure: CARDIOVERSION;  Surgeon: [name], MD;  Location: [facility] ENDOSCOPY;  Service: Cardiovascular;  Laterality: N/A;   CARPAL TUNNEL RELEASE Bilateral 2015;  2016   CATARACT EXTRACTION W/ INTRAOCULAR LENS  IMPLANT, BILATERAL  2017   CORONARY ANGIOPLASTY  1990  to 2004   multiple to RCA   CORONARY ARTERY BYPASS GRAFT  1990    @ [place] (dr chitwood)   seq. LIMA to LAD and Diagonal   CORONARY ARTERY BYPASS GRAFT  2004     dr florette   SVG to  RCA and PDA;  ALSO MAZE  PROCEDURE   ESOPHAGOGASTRODUODENOSCOPY Left 03/18/2013   Procedure: ESOPHAGOGASTRODUODENOSCOPY (EGD);  Surgeon: [name], MD;  Location: [facility] ENDOSCOPY;  Service:  Endoscopy;  Laterality: Left;   HARDWARE REMOVAL N/A 05/18/2016   Procedure: Removal of broken hardware Lumbar three-four;  Surgeon: [name], MD;  Location: [facility] OR;  Service: Neurosurgery;  Laterality: N/A;   HERNIA REPAIR     LEFT HEART CATH AND CORS/GRAFTS ANGIOGRAPHY N/A 12/18/2018   Procedure: LEFT HEART CATH AND CORS/GRAFTS ANGIOGRAPHY;  Surgeon: [name], MD;  Location: [facility] INVASIVE CV LAB;  Service: Cardiovascular;  Laterality: N/A;   LEFT HEART CATHETERIZATION WITH CORONARY/GRAFT ANGIOGRAM N/A 12/21/2010   Procedure: LEFT HEART CATHETERIZATION WITH EL BILE;  Surgeon: Alm LELON Clay, MD;  Location: [facility] CATH  LAB;  Service: Cardiovascular;  Laterality: N/A;   LEG SURGERY Right 2002  approx.   lengthened his leg   LUMBAR LAMINECTOMY/DECOMPRESSION MICRODISCECTOMY  01/10/2012   Procedure: LUMBAR LAMINECTOMY/DECOMPRESSION MICRODISCECTOMY 1 LEVEL;  Surgeon: [name], MD;  Location: [facility] NEURO ORS;  Service: Neurosurgery;  Laterality: Bilateral;  Lumbar three-four decompression, Posterior lateral fusion lumbar three-four, posterior spinus plate lumbar three-four   LUMBAR LAMINECTOMY/DECOMPRESSION MICRODISCECTOMY Left 05/18/2016   Procedure: Laminectomy and Foraminotomy - Lumbar two-lumbar three- Lumbar four-lumbar five- left with removal hardware lumbar three-four;  Surgeon: [name], MD;  Location: [facility] OR;  Service: Neurosurgery;  Laterality: Left;   NASAL SEPTUM SURGERY  yrs ago   PACEMAKER REVISION N/A 03/07/2012   Procedure: PACEMAKER REVISION;  Surgeon: [name] Balding, MD;  Location: [facility] CATH LAB;  Service: Cardiovascular;  Laterality: N/A;   POSTERIOR LUMBAR FUSION  02-22-2017   dr molt  @ [facility]   re-do laminectomy L2-3 and fusion   POSTERIOR LUMBAR FUSION  03-11-2018   dr molt @ [facility]   laminectomy L4-5;  fixation L2-S1 and fusion L3-S1   SACROILIAC JOINT FUSION N/A 07/20/2020   Procedure: Sacroiliac Joint Fusion;  Surgeon: Carollee [name], DO;  [facility] OR;   Service: Neurosurgery;  Laterality: N/A;   SHOULDER ARTHROSCOPY Right after 2016, pt unsure year   SHOULDER ARTHROSCOPY WITH OPEN ROTATOR CUFF REPAIR Right 03/30/2014   Procedure: RIGHT SHOULDER ARTHROSCOPY  OPEN ROTATOR CUFF REPAIR;  Surgeon: Dempsey JINNY Sensor, MD;  Location: [facility] OR;  Service: Orthopedics;  Laterality: Right;   SHOULDER ARTHROSCOPY WITH SUBACROMIAL DECOMPRESSION, ROTATOR CUFF REPAIR AND BICEP TENDON REPAIR Left 10/01/2018   Procedure: LEFT SHOULDER ARTHROSCOPY, DISTAL CLAVICLE EXCISION,SUBACROMIAL, DECOMPRESSION, RORATOR CUFF REPAIR, BICEPS TENODESIS;  Surgeon: Beverley Evalene BIRCH, MD;  Location: [facility];  Service: Orthopedics;  Laterality: Left;   SHOULDER OPEN ROTATOR CUFF REPAIR Right 03/30/2014   Procedure: ROTATOR CUFF REPAIR SHOULDER OPEN;  Surgeon: Dempsey JINNY Sensor, MD;  Location: [facility] OR;  Service: Orthopedics;  Laterality: Right;   TEE WITHOUT CARDIOVERSION N/A 01/15/2019   Procedure: TRANSESOPHAGEAL ECHOCARDIOGRAM (TEE);  Surgeon: Balding [name], MD;  Location: [facility] ENDOSCOPY;  Service: Cardiovascular;  Laterality: N/A;   TONSILLECTOMY  child   TOTAL KNEE ARTHROPLASTY Left 2006    SOCIAL HISTORY: Social History   Socioeconomic History   Marital status: Single    Spouse name: Not on file   Number of children: 1   Years of education: 16   Highest education level: Associate degree: academic program  Occupational History   Occupation: pilot-retired  Tobacco Use   Smoking status: Never   Smokeless tobacco: Never  Vaping Use   Vaping status: Never Used  Substance and Sexual Activity   Alcohol use: Not Currently   Drug use: Never   Sexual activity: Yes  Other Topics Concern   Not on file  Social History Narrative   ** Merged History Encounter **       [place], [place].  Right-handed. 1 cup caffeine per day.   Social Drivers of Corporate Investment Banker Strain: Low Risk  (05/23/2023)   Received from [facility]    Overall Financial Resource Strain (CARDIA)    Difficulty of Paying Living Expenses: Not hard at all  Food Insecurity: No Food Insecurity (06/02/2023)   Hunger Vital Sign    Worried About Running Out of Food in the Last Year: Never true    Ran Out of Food in  the Last Year: Never true  Transportation Needs: No Transportation Needs (06/02/2023)   PRAPARE - Administrator, Civil Service (Medical): No    Lack of Transportation (Non-Medical): No  Physical Activity: Sufficiently Active (05/23/2023)   Received from [facility]   Exercise Vital Sign    On average, how many days per week do you engage in moderate to strenuous exercise (like a brisk walk)?: 5 days    On average, how many minutes do you engage in exercise at this level?: 60 min  Stress: No Stress Concern Present (10/15/2023)   Received from [facility] - Occupational Stress Questionnaire    Do you feel stress - tense, restless, nervous, or anxious, or unable to sleep at night because your mind is troubled all the time - these days?: Not at all  Social Connections: Socially Integrated (06/02/2023)   Social Connection and Isolation Panel    Frequency of Communication with Friends and Family: More than three times a week    Frequency of Social Gatherings with Friends and Family: Three times a week    Attends Religious Services: More than 4 times per year    Active Member of Clubs or Organizations: Yes    Attends Banker Meetings: 1 to 4 times per year    Marital Status: Married  Catering Manager Violence: Not At Risk (06/02/2023)   Humiliation, Afraid, Rape, and Kick questionnaire    Fear of Current or Ex-Partner: No    Emotionally Abused: No    Physically Abused: No    Sexually Abused: No  Retired occupational hygienist.  Also worked as a firefighter.  Lives in [place]. Non-smoker.  Rare alcohol use.   FAMILY HISTORY: Family History  Problem Relation Age of Onset   Other Father         MVA   Cancer Mother     ALLERGIES:  has no known allergies.  MEDICATIONS:  Current Outpatient Medications  Medication Sig Dispense Refill   amiodarone  (PACERONE ) 200 MG tablet Take 200 mg by mouth daily.     apixaban  (ELIQUIS ) 2.5 MG TABS tablet Take 1 tablet (2.5 mg total) by mouth 2 (two) times daily. 180 tablet 3   Baclofen  5 MG TABS Take 5 mg by mouth at bedtime.     carvedilol  (COREG ) 25 MG tablet TAKE 2 TABLETS (50 MG TOTAL) BY MOUTH 2 (TWO) TIMES DAILY WITH A MEAL. 360 tablet 2   Continuous Glucose Receiver (DEXCOM G7 RECEIVER) DEVI SMARTSIG:1 Unit(s) Daily PRN     Continuous Glucose Sensor (DEXCOM G7 SENSOR) MISC 1 Units by Does not apply route.     Cyanocobalamin  (VITAMIN B-12 PO) Take 1 tablet by mouth daily.     cyanocobalamin  (VITAMIN B12) 1000 MCG tablet Take 1,000 mcg by mouth daily.     DULoxetine (CYMBALTA) 60 MG capsule Take 60 mg by mouth daily.     empagliflozin  (JARDIANCE ) 10 MG TABS tablet Take 1 tablet (10 mg total) by mouth daily before breakfast. 90 tablet 3   Evolocumab  (REPATHA  SURECLICK) 140 MG/ML SOAJ Inject 140 mg into the skin every 14 (fourteen) days. 6 mL 2   ferrous sulfate  325 (65 FE) MG tablet Take 325 mg by mouth daily at 6 (six) AM.     glipiZIDE  (GLUCOTROL ) 10 MG tablet Take 10 mg by mouth 2 (two) times daily. (Patient taking differently: Take 5 mg by mouth daily at 6 (six) AM.)     insulin   lispro (HUMALOG ) 100 UNIT/ML injection Inject 0-10 Units into the skin 2 (two) times daily as needed for high blood sugar. Per sliding scale.     JARDIANCE  10 MG TABS tablet Take 1 tablet by mouth once daily 90 tablet 0   Multiple Vitamin (MULTIVITAMIN WITH MINERALS) TABS tablet Take 1 tablet by mouth daily.      ONETOUCH ULTRA test strip 1 each by Other route as needed.     spironolactone  (ALDACTONE ) 25 MG tablet Take 12.5 mg by mouth daily.     torsemide  (DEMADEX ) 20 MG tablet Take 0.5 tablets (10 mg total) by mouth every other day. PLEASE SCHEDULE  APPOINTMENT FOR MORE REFILLS 30 tablet 0   triamcinolone cream (KENALOG) 0.1 % Apply 1 Application topically 2 (two) times daily as needed (rough skin patches).     No current facility-administered medications for this visit.    REVIEW OF SYSTEMS:   10 Point review of Systems was done is negative except as noted above.  PHYSICAL EXAMINATION: ECOG PERFORMANCE STATUS: 2 - Symptomatic, <50% confined to bed  Vitals:   11/27/23 1159 11/27/23 1207  BP: (!) 157/89 (!) 155/69  Pulse: 83   Resp: 20   Temp: (!) 97.5 F (36.4 C)   SpO2: 100%    Filed Weights   11/27/23 1159  Weight: 195 lb 8 oz (88.7 kg)  Body mass index is 28.05 kg/m.  NAD GENERAL:alert, in no acute distress and comfortable SKIN: no acute rashes, no significant lesions; (+) Multiple contusion along bilateral upper extremities severe left orbital contusion. EYES: conjunctiva are pink and non-injected, sclera anicteric OROPHARYNX: MMM, no exudates, no oropharyngeal erythema or ulceration NECK: supple, no JVD LYMPH:  no palpable lymphadenopathy in the cervical, axillary or inguinal regions LUNGS: clear to auscultation b/l with normal respiratory effort HEART: regular rate & rhythm ABDOMEN:  normoactive bowel sounds , non tender, not distended. Extremity: no pedal edema PSYCH: alert & oriented x 3 with fluent speech NEURO: no focal motor/sensory deficits   LABORATORY DATA:  I have reviewed the data as listed     Latest Ref Rng & Units 11/27/2023   11:24 AM 11/20/2023    8:42 PM 09/17/2023   12:12 PM  CBC EXTENDED  WBC 4.0 - 10.5 K/uL 6.6  9.8  5.3   RBC 4.22 - 5.81 MIL/uL 3.66  3.70  3.52   Hemoglobin 13.0 - 17.0 g/dL 88.0  87.9  88.6   HCT 39.0 - 52.0 % 35.1  35.6  33.7   Platelets 150 - 400 K/uL 109  111  83   NEUT# 1.7 - 7.7 K/uL 4.5  7.7  3.4   Lymph# 0.7 - 4.0 K/uL 1.2  1.3  1.2    LDH Lab Results  Component Value Date   LDH 122 11/27/2023   SED RATE    Component Value Date/Time   ESRSEDRATE  45 (H) 11/27/2023 1124      Latest Ref Rng & Units 11/27/2023   11:24 AM 11/20/2023    8:42 PM 09/03/2023   12:52 PM  CMP  Glucose 70 - 99 mg/dL 873  864  875   BUN 8 - 23 mg/dL 39  44  42   Creatinine 0.61 - 1.24 mg/dL 8.06  7.88  7.85   Sodium 135 - 145 mmol/L 136  134  138   Potassium 3.5 - 5.1 mmol/L 4.2  4.8  4.5   Chloride 98 - 111 mmol/L 105  100  107  CO2 22 - 32 mmol/L 25  24  26    Calcium  8.9 - 10.3 mg/dL 9.3  89.6  9.1   Total Protein 6.5 - 8.1 g/dL 7.9  8.6  8.0   Total Bilirubin 0.0 - 1.2 mg/dL 0.4  0.4  0.3   Alkaline Phos 38 - 126 U/L 68  76  63   AST 15 - 41 U/L 19  24  17    ALT 0 - 44 U/L 17  18  13     MULTIPLE MYELOMA PANEL & KAPPA/LAMDA LIGHT CHAINS 07/2021 - 11/2023      RADIOGRAPHIC STUDIES: I have personally reviewed the radiological images as listed and agreed with the findings in the report. CT Cervical Spine Wo Contrast Result Date: 11/20/2023 EXAM: CT CERVICAL SPINE WITHOUT CONTRAST 11/20/2023 09:12:56 PM TECHNIQUE: CT of the cervical spine was performed without the administration of intravenous contrast. Multiplanar reformatted images are provided for review. Automated exposure control, iterative reconstruction, and/or weight based adjustment of the mA/kV was utilized to reduce the radiation dose to as low as reasonably achievable. COMPARISON: CT cervical spine dated 09/05/2022. CLINICAL HISTORY: Neck trauma (Age >= 65y). Pt tripped and fell pta and struck his head on concrete, left elbow injury (?skin tear, wrapped pta). No LOC. Pt is alert and oriented. 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 Stable grade 1 anterolisthesis of C3 on C4, C4 on C5, C5 on C6. No acute fracture or traumatic malalignment. DEGENERATIVE CHANGES: Multilevel moderate-to-severe degenerative changes of the spine. Moderate to severe right C3-C4, C4-C5, C5-C6 osseous neural foraminal stenosis. No severe osseous central canal stenosis.  SOFT TISSUES: No prevertebral soft tissue swelling. VASCULATURE: Atherosclerotic plaque of the carotid arteries within the neck. IMPRESSION: 1. No acute cervical spine abnormality related to reported neck trauma. 2. Degenerative changes with Moderate to severe right C3-C4, C4-C5, C5-C6 osseous neural foraminal stenosis. Electronically signed by: Kate Plummer MD 11/20/2023 09:33 PM EDT RP Workstation: [id number]   CT CHEST ABDOMEN PELVIS WO CONTRAST Result Date: 11/20/2023 EXAM: CT CHEST, ABDOMEN AND PELVIS WITHOUT CONTRAST 11/20/2023 09:12:56 PM TECHNIQUE: CT of the chest, abdomen and pelvis was performed without the administration of intravenous contrast. Multiplanar reformatted images are provided for review. Automated exposure control, iterative reconstruction, and/or weight based adjustment of the mA/kV was utilized to reduce the radiation dose to as low as reasonably achievable. COMPARISON: CT chest abdomen and pelvis 09/05/2022. CLINICAL HISTORY: Polytrauma, blunt. Pt tripped and fell pta and struck his head on concrete, left elbow injury (?skin tear, wrapped pta). No LOC. Pt is alert and oriented. FINDINGS: CHEST: MEDIASTINUM AND LYMPH NODES: Heart is mildly enlarged. Severe atherosclerotic plaque. Coronary artery calcification. Coronary artery bypass graft. The central airways are clear. No gross hilar adenopathy with limited evaluation on this noncontrast study. No mediastinal or axillary lymphadenopathy. LUNGS AND PLEURA: Slightly limited evaluation of the lungs due to motion artifact. No focal consolidation or pulmonary edema. No pleural effusion or pneumothorax. ABDOMEN AND PELVIS: LIVER: The liver is unremarkable. GALLBLADDER AND BILE DUCTS: Gallbladder is unremarkable. No biliary ductal dilatation. SPLEEN: No acute abnormality. PANCREAS: No acute abnormality. ADRENAL GLANDS: No acute abnormality. KIDNEYS, URETERS AND BLADDER: No stones in the kidneys or ureters. No hydronephrosis. No perinephric  or periureteral stranding. Urinary bladder is unremarkable. GI AND BOWEL: Stomach demonstrates no acute abnormality. Colonic diverticulosis. No small or large bowel wall thickening or dilatation. The appendix is not definitely identified with no inflammatory changes in the right lower quadrant to  suggest acute appendicitis. REPRODUCTIVE ORGANS: Prostate is enlarged measuring up to 5.1 cm. PERITONEUM AND RETROPERITONEUM: No ascites. No free air. VASCULATURE: Aorta is normal in caliber. ABDOMINAL AND PELVIS LYMPH NODES: No lymphadenopathy. BONES AND SOFT TISSUES: Left chest wall pacemaker. Bilateral gynecomastia. Old healed rib fractures. No acute displaced rib fracture. No acute displaced sternal fracture. Intact sternotomy wires. Chronic stable T8 superior endplate compression fracture. L2-L5 surgical hardware fusion. No acute displaced fracture of the thoracolumbar spine. Multilevel severe degenerative changes of the spine. Severe degenerative changes of the left shoulder. Moderate degenerative changes of the right shoulder. No acute displaced fracture or dislocation of either hips. No acute displaced fracture or diastasis of the bones of the pelvis. Right sacral iliac joint surgical hardware fusion. IMPRESSION: 1. No acute traumatic injury identified in the chest, abdomen, or pelvis. 2. No acute displaced thoracolumbar spine or pelvic/hip fractures. 3. Atherosclerotic plaque. Electronically signed by: Morgane Naveau MD 11/20/2023 09:30 PM EDT RP Workstation: [id number]   CT Head Wo Contrast Result Date: 11/20/2023 EXAM: CT HEAD WITHOUT CONTRAST 11/20/2023 09:12:56 PM TECHNIQUE: CT of the head was performed without the administration of intravenous contrast. Automated exposure control, iterative reconstruction, and/or weight based adjustment of the mA/kV was utilized to reduce the radiation dose to as low as reasonably achievable. COMPARISON: CT head 09/05/22 CLINICAL HISTORY: Head trauma, moderate-severe; fall,  anticoagulation. Pt tripped and fell pta and struck his head on concrete, left elbow injury (?skin tear, wrapped pta). No LOC. Pt is alert and oriented. FINDINGS: BRAIN AND VENTRICLES: No acute hemorrhage. No evidence of acute infarct. No hydrocephalus. No extra-axial collection. No mass effect or midline shift. Patchy and confluent areas of decreased attenuation are noted throughout the deep and periventricular white matter of the cerebral hemispheres bilaterally suggestive of chronic microvascular ischemic changes. Diffuse cerebral volume loss. Atherosclerotic calcifications are present within the cavernous internal carotid arteries. ORBITS: No acute abnormality. SINUSES: No acute abnormality. SOFT TISSUES AND SKULL: Left frontal scalp 8 mm hematoma. No skull fracture. IMPRESSION: 1. No acute intracranial abnormality. Electronically signed by: Morgane Naveau MD 11/20/2023 09:23 PM EDT RP Workstation: [id number]   DG Elbow Complete Left Result Date: 11/20/2023 EXAM: 3 VIEW(S) XRAY OF THE LEFT ELBOW COMPARISON: None available. CLINICAL HISTORY: fall. Pt tripped and fell pta and and struck his head on concrete, left elbow injury (?skin tear, wrapped pta). No LOC. Pt is alert and oriented. FINDINGS: BONES AND JOINTS: Olecranon enthesopathy. No acute fracture. No focal osseous lesion. No joint dislocation. No joint effusion. SOFT TISSUES: Vascular calcifications. IMPRESSION: 1. No acute abnormality Electronically signed by: Morgane Naveau MD 11/20/2023 09:15 PM EDT RP Workstation: [id number]   DG Wrist Complete Left Result Date: 11/20/2023 CLINICAL DATA:  Fall, pain EXAM: LEFT WRIST - COMPLETE 3+ VIEW COMPARISON:  None Available. 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 No acute bony abnormality. Specifically, no fracture, subluxation, or dislocation. Vascular calcifications. Soft tissues are intact. IMPRESSION: No acute bony abnormality. Electronically Signed   By: Franky Crease M.D.    On: 11/20/2023 21:14    ASSESSMENT & PLAN:   83 year old male with multiple medical comorbidities including hypertension, diabetes, coronary artery disease, ischemic cardiomyopathy, chronic kidney disease, sleep apnea on CPAP with  #1 IgG kappa monoclonal paraproteinemia with an M spike of 1.7 g/dL Patient does have some anemia and chronic kidney disease which are likely related to other risk factors including hypertension, diabetes, dyslipidemia etc. and less likely to the monoclonal paraproteinemia.  Also patient has ischemic cardiomyopathy  which is likely explanation for his congestive heart failure along with decreasing diuretics and seems to be less likely related to AL amyloidosis clinically.  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 - Independently reviewed CMP with Creatinine 2.11 decreased from 2.14 and BUN 44 increased from 42.  CKD stable. M protein 1.8 decreased from 2.1. LDH stable at 122.  Sed rate 45. - no lab or clinical findings suggestive of progression to active myeloma at this time. - Suggested various assistance devices to prevent falls  FOLLOW-UP: Return to clinic with Dr. Onesimo with labs in 4 months.  The total time spent in the appointment was 21 minutes* .  All of the patient's questions were answered and the patient knows to call the clinic with any problems, questions, or concerns.  Emaline Onesimo MD MS AAHIVMS [facility] Hematology/Oncology Physician [facility]  *Total Encounter Time as defined by the Centers for Medicare and Medicaid Services includes, in addition to the face-to-face time of a patient visit (documented in the note above) non-face-to-face time: obtaining and reviewing outside history, ordering and reviewing medications, tests or procedures, care coordination  (communications with other health care professionals or caregivers) and documentation in the medical record.  I,  Damien Lagle,acting as a scribe for Emaline Onesimo, MD.,have documented all relevant documentation on the behalf of Emaline Onesimo, MD,as directed by  Emaline Onesimo, MD while in the presence of Emaline Onesimo, MD.  I have reviewed the above documentation for accuracy and completeness, and I agree with the above. Emaline Candida Onesimo MD.

## 2023-11-28 ENCOUNTER — Telehealth: Payer: Self-pay | Admitting: Hematology

## 2023-11-28 LAB — KAPPA/LAMBDA LIGHT CHAINS
Kappa free light chain: 36.2 mg/L — ABNORMAL HIGH (ref 3.3–19.4)
Kappa, lambda light chain ratio: 1.59 (ref 0.26–1.65)
Lambda free light chains: 22.7 mg/L (ref 5.7–26.3)

## 2023-11-28 NOTE — Telephone Encounter (Signed)
 Scheduled patient for next appointments. Called and left a voicemail with the appointment details.

## 2023-11-30 LAB — MULTIPLE MYELOMA PANEL, SERUM
Albumin SerPl Elph-Mcnc: 3.3 g/dL (ref 2.9–4.4)
Albumin/Glob SerPl: 0.9 (ref 0.7–1.7)
Alpha 1: 0.2 g/dL (ref 0.0–0.4)
Alpha2 Glob SerPl Elph-Mcnc: 0.9 g/dL (ref 0.4–1.0)
B-Globulin SerPl Elph-Mcnc: 0.8 g/dL (ref 0.7–1.3)
Gamma Glob SerPl Elph-Mcnc: 2.2 g/dL — ABNORMAL HIGH (ref 0.4–1.8)
Globulin, Total: 4.1 g/dL — ABNORMAL HIGH (ref 2.2–3.9)
IgA: 96 mg/dL (ref 61–437)
IgG (Immunoglobin G), Serum: 2371 mg/dL — ABNORMAL HIGH (ref 603–1613)
IgM (Immunoglobulin M), Srm: 16 mg/dL (ref 15–143)
M Protein SerPl Elph-Mcnc: 1.8 g/dL — ABNORMAL HIGH
Total Protein ELP: 7.4 g/dL (ref 6.0–8.5)

## 2023-12-11 ENCOUNTER — Telehealth (HOSPITAL_COMMUNITY): Payer: Self-pay | Admitting: Cardiology

## 2023-12-11 NOTE — Telephone Encounter (Signed)
 Called to confirm/remind patient of their appointment at the [facility] on 12/11/23.   Appointment:   [x] Confirmed  [] Left mess   [] No answer/No voice mail  [] VM Full/unable to leave message  [] Phone not in service  Patient reminded to bring all medications and/or complete list.  Confirmed patient has transportation. Gave directions, instructed to utilize valet parking.

## 2023-12-12 ENCOUNTER — Ambulatory Visit (HOSPITAL_COMMUNITY)
Admission: RE | Admit: 2023-12-12 | Discharge: 2023-12-12 | Disposition: A | Source: Ambulatory Visit | Attending: Cardiology | Admitting: Cardiology

## 2023-12-12 ENCOUNTER — Ambulatory Visit (HOSPITAL_COMMUNITY): Payer: Self-pay | Admitting: Cardiology

## 2023-12-12 ENCOUNTER — Other Ambulatory Visit (HOSPITAL_COMMUNITY): Payer: Self-pay

## 2023-12-12 ENCOUNTER — Ambulatory Visit (HOSPITAL_COMMUNITY)
Admission: RE | Admit: 2023-12-12 | Discharge: 2023-12-12 | Disposition: A | Discharge status: Home / Self Care | Source: Ambulatory Visit | Attending: Cardiology | Admitting: Cardiology

## 2023-12-12 VITALS — BP 162/74 | HR 72 | Wt 196.0 lb

## 2023-12-12 DIAGNOSIS — Z79899 Other long term (current) drug therapy: Secondary | ICD-10-CM | POA: Diagnosis not present

## 2023-12-12 DIAGNOSIS — K4090 Unilateral inguinal hernia, without obstruction or gangrene, not specified as recurrent: Secondary | ICD-10-CM | POA: Insufficient documentation

## 2023-12-12 DIAGNOSIS — R0609 Other forms of dyspnea: Secondary | ICD-10-CM | POA: Diagnosis not present

## 2023-12-12 DIAGNOSIS — I25708 Atherosclerosis of coronary artery bypass graft(s), unspecified, with other forms of angina pectoris: Secondary | ICD-10-CM | POA: Diagnosis not present

## 2023-12-12 DIAGNOSIS — M791 Myalgia, unspecified site: Secondary | ICD-10-CM | POA: Diagnosis not present

## 2023-12-12 DIAGNOSIS — Z7984 Long term (current) use of oral hypoglycemic drugs: Secondary | ICD-10-CM | POA: Insufficient documentation

## 2023-12-12 DIAGNOSIS — Z7901 Long term (current) use of anticoagulants: Secondary | ICD-10-CM | POA: Diagnosis not present

## 2023-12-12 DIAGNOSIS — C9000 Multiple myeloma not having achieved remission: Secondary | ICD-10-CM | POA: Insufficient documentation

## 2023-12-12 DIAGNOSIS — N184 Chronic kidney disease, stage 4 (severe): Secondary | ICD-10-CM | POA: Diagnosis not present

## 2023-12-12 DIAGNOSIS — I77810 Thoracic aortic ectasia: Secondary | ICD-10-CM | POA: Insufficient documentation

## 2023-12-12 DIAGNOSIS — R9431 Abnormal electrocardiogram [ECG] [EKG]: Secondary | ICD-10-CM | POA: Diagnosis not present

## 2023-12-12 DIAGNOSIS — I5032 Chronic diastolic (congestive) heart failure: Secondary | ICD-10-CM | POA: Insufficient documentation

## 2023-12-12 DIAGNOSIS — Z951 Presence of aortocoronary bypass graft: Secondary | ICD-10-CM | POA: Diagnosis not present

## 2023-12-12 DIAGNOSIS — D472 Monoclonal gammopathy: Secondary | ICD-10-CM | POA: Diagnosis not present

## 2023-12-12 DIAGNOSIS — I509 Heart failure, unspecified: Secondary | ICD-10-CM | POA: Diagnosis present

## 2023-12-12 DIAGNOSIS — K922 Gastrointestinal hemorrhage, unspecified: Secondary | ICD-10-CM | POA: Insufficient documentation

## 2023-12-12 DIAGNOSIS — R002 Palpitations: Secondary | ICD-10-CM | POA: Diagnosis not present

## 2023-12-12 DIAGNOSIS — G14 Postpolio syndrome: Secondary | ICD-10-CM | POA: Insufficient documentation

## 2023-12-12 DIAGNOSIS — Z950 Presence of cardiac pacemaker: Secondary | ICD-10-CM | POA: Diagnosis not present

## 2023-12-12 DIAGNOSIS — I77819 Aortic ectasia, unspecified site: Secondary | ICD-10-CM | POA: Diagnosis not present

## 2023-12-12 DIAGNOSIS — I2510 Atherosclerotic heart disease of native coronary artery without angina pectoris: Secondary | ICD-10-CM | POA: Insufficient documentation

## 2023-12-12 DIAGNOSIS — I358 Other nonrheumatic aortic valve disorders: Secondary | ICD-10-CM | POA: Insufficient documentation

## 2023-12-12 DIAGNOSIS — I130 Hypertensive heart and chronic kidney disease with heart failure and stage 1 through stage 4 chronic kidney disease, or unspecified chronic kidney disease: Secondary | ICD-10-CM | POA: Diagnosis not present

## 2023-12-12 DIAGNOSIS — I480 Paroxysmal atrial fibrillation: Secondary | ICD-10-CM | POA: Insufficient documentation

## 2023-12-12 LAB — ECHOCARDIOGRAM COMPLETE
AR max vel: 3.9 cm2
AV Area VTI: 4.23 cm2
AV Area mean vel: 4.18 cm2
AV Mean grad: 2 mmHg
AV Peak grad: 4.2 mmHg
Ao pk vel: 1.03 m/s
Area-P 1/2: 2.87 cm2
Calc EF: 73.3 %
S' Lateral: 2.9 cm
Single Plane A2C EF: 76.3 %
Single Plane A4C EF: 72.4 %

## 2023-12-12 LAB — LIPID PANEL
Cholesterol: 118 mg/dL (ref 0–200)
HDL: 51 mg/dL
LDL Cholesterol: 49 mg/dL (ref 0–99)
Total CHOL/HDL Ratio: 2.3 ratio
Triglycerides: 92 mg/dL
VLDL: 18 mg/dL (ref 0–40)

## 2023-12-12 LAB — TSH: TSH: 0.966 u[IU]/mL (ref 0.350–4.500)

## 2023-12-12 LAB — PROTEIN / CREATININE RATIO, URINE
Creatinine, Urine: 49 mg/dL
Protein Creatinine Ratio: 0.22 mg/mg{creat} — ABNORMAL HIGH (ref 0.00–0.15)
Total Protein, Urine: 11 mg/dL

## 2023-12-12 LAB — BRAIN NATRIURETIC PEPTIDE: B Natriuretic Peptide: 127.3 pg/mL — ABNORMAL HIGH (ref 0.0–100.0)

## 2023-12-12 MED ORDER — KERENDIA 10 MG PO TABS: 10.0000 mg | ORAL_TABLET | Freq: Every day | ORAL | 11 refills | Status: DC

## 2023-12-12 MED ORDER — EZETIMIBE 10 MG PO TABS: 10.0000 mg | ORAL_TABLET | Freq: Every day | ORAL | 3 refills | Status: AC

## 2023-12-12 MED ORDER — PERFLUTREN LIPID MICROSPHERE
1.0000 mL | INTRAVENOUS | Status: DC | PRN
  Administered 2023-12-12: 2 mL via INTRAVENOUS

## 2023-12-12 NOTE — Progress Notes (Signed)
  Echocardiogram 2D Echocardiogram has been performed.  Alan [name] [facility] 12/12/2023, 9:49 AM

## 2023-12-12 NOTE — Patient Instructions (Signed)
 START Kerendia 10 mg daily   Labs done today, your results will be available in MyChart, we will contact you for abnormal readings.  REPEAT blood work in 10 days.  Your physician recommends that you schedule a follow-up appointment in: 3 months.  If you have any questions or concerns before your next appointment please send us  a message through [place] or call our office at [phone].    TO LEAVE A MESSAGE FOR THE NURSE SELECT OPTION 2, PLEASE LEAVE A MESSAGE INCLUDING: YOUR NAME DATE OF BIRTH CALL BACK NUMBER REASON FOR CALL**this is important as we prioritize the call backs  YOU WILL RECEIVE A CALL BACK THE SAME DAY AS LONG AS YOU CALL BEFORE 4:00 PM  At the [facility], you and your health needs are our priority. As part of our continuing mission to provide you with exceptional heart care, we have created designated Provider Care Teams. These Care Teams include your primary Cardiologist (physician) and Advanced Practice Providers (APPs- Physician Assistants and Nurse Practitioners) who all work together to provide you with the care you need, when you need it.   You may see any of the following providers on your designated Care Team at your next follow up: Dr Toribio Fuel Dr Ezra Shuck Dr. Morene Brownie Greig Mosses, NP Caffie Shed, [place] [facility] [place], [place] Beckey Coe, NP Jordan Lee, NP Ellouise Class, NP Tinnie Redman, PharmD Jaun Bash, PharmD   Please be sure to bring in all your medications bottles to every appointment.    Thank you for choosing [facility]

## 2023-12-12 NOTE — Progress Notes (Signed)
 PCP: [name], MD Cardiology: Dr. Francyne HF Cardiology: Dr. Rolan  Chief complaint: CHF  83 y.o. with history of complete heart block s/p pacemaker, CAD s/p CABG and redo, CKD stage 3, GI bleeding, and chronic diastolic CHF was referred to CHF clinic from the [facility] clinic. Patient has a long history of CAD. He had his initial CABG in 1990 then redo in 2004. Last cath in 11/20 showed patent sequential LIMA-LAD/D and SVG-PDA, medical management.  He has a Medtronic PPM due to complete heart block, has had this for years.  He has paroxysmal atrial fibrillation, in NSR today. He is not on anticoagulation due to GI bleeding.  He has not had suitable anatomy for Watchman due to surgical clipping with partial closure of the LA appendage.   Patient has struggled recently with diastolic CHF.  He was admitted in 12/22 with CHF exacerbation.  Echo in 12/22 showed EF 55-60%, mild LVH, normal RV, moderate LAE. With diastolic CHF, PAF, biceps tendon rupture, and bilateral carpal tunnel syndrome, cardiac amyloidosis was considered.  PYP scan was not suggestive of TTR cardiac amyloidosis. 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 He has been seen by hematology, bone marrow biopsy in 3/23 showed plasma cell neoplasm with >10% clonal plasma cells, biopsy stained negative for amyloidosis.    In 5/24, he had inguinal hernia repair.   In 10/24, he was admitted to a hospital in [place] with suspected TIA.  BP was markedly high. CT head was negative, CTA head/neck showed no carotid stenosis.  He was unable to get an MRI due to his pacemaker. Echo showed EF 50-55%, moderate LVH, mild MR, mild-moderate TR, moderate RV dysfunction on my review, biatrial enlargement, IVC not dilated.   Follow up 11/24, NYHA III and mildly volume overloaded. Torsemide  increased to 10 mg daily and spiro 12.5 daily added. With recent TIA and partially closed LA appendage from surgical clip,  Plavix  stopped and Eliquis  2.5 mg bid started.   Admitted to [facility] 12/31/22 with AKI 2/2 acute gastroenteritis. Received IVF and abx. CT A/P unremarkable. SCr peaked at 2.54. He was discharged home, weight 198 lbs.  He was admitted overnight in 4/25 primarily due to palpitations.  He was noted to have intermittent v-pacing with type 1 2nd degree AV block.  He was converted over to DDDR pacing with shortened AV delay, committing him to A-V sequential pacing.  He says that this resolved his palpitations.   Echo was done today and reviewed, EF 50-55%, mild LVH, mild RV enlargement, mild RV dysfunction.   He returns today for followup of CHF.  He seems to be doing well in general.  Rare exertional dyspnea.  No chest pain.  No lightheadedness or palpitations.  No orthopnea/PND. Weight down 18 lb, he is taking semaglutide. BP is elevated in the office today. He has been off spironolactone , not sure why.   Medtronic device interrogation: 100% A-V sequential pacing, no AF.   Labs (1/23): K 4, creatinine 2.37, myeloma panel with IgG monoclonal protein with kappa light chain specificity, urine immunofixation with IgG monoclonal protein. Labs (2/23): K 4.8, creatinine 2.9 => 2.55, LFTs normal, TSH normal, BNP 102 Labs (3/23): hgb 11.1, plts 91 Labs (10/23): hgb 13.7, K 4.1, creatinine 1.81 Labs (10/24): LDL 94, K 4.3, creatinine 8.27, LDL 99 Labs (12/24): K 5.3, creatinine 2.30 Labs (4/25): K 4.4, creatinine 1.69, BNP 167, LFTs normal, TSH normal Labs (10/25): K 4.2, creatinine 1.93, LFTs normal, hgb 11.9  PMH: 1. Atrial fibrillation: Paroxysmal 2. Complete heart block: MDT PPM placed in his 50s.  3. Biceps tendon rupture 4. 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 HTN 7. Hyperlipidemia: h/o statin myopathy, muscle pain with Zetia , PCSK9 has been too expensive.  8. CKD stage 4 9. H/o GI bleeding: Not suitable Watchman candidate due to partial closure of the appendage by surgical  clip.  He is not anticoagulated.  10. Chronic diastolic CHF: Echo (12/22) with EF 55-60%, mild LVH, normal RV, moderate LAE.  - PYP scan (1/23): grade 1, H/CL 0.8 - Echo (10/24): EF 50-55%, moderate LVH, mild MR, mild-moderate TR, moderate RV dysfunction on my review, biatrial enlargement, IVC not dilated.  - Echo (10/25): EF 50-55%, mild LVH, mild RV enlargement, mild RV dysfunction.  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 Bone marrow biopsy 3/23 with plasma cell neoplasm with >10% clonal plasma cells, stain for amyloidosis was negative.  Smoldering myeloma.  12. CAD: CABG x 2 in 1990, redo CABG x 2 in 2004.  - LHC (11/20): occluded proximal LAD, 99% dLCx, occluded proximal RCA, LIMA-LAD/diagonal patent, SVG-PDA patent. Distal LCx was small, managed medically.  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  14. TIA (10/24): CT head and CTA neck unremarkable.   SH: Retired occupational hygienist, also was a firefighter.  Lives in [place].  Nonsmoker.  Rare ETOH.   Family History  Problem Relation Age of Onset   Other Father        MVA   Cancer Mother    ROS: All systems reviewed and negative except as per HPI.   Current Outpatient Medications  Medication Sig Dispense Refill   amiodarone  (PACERONE ) 200 MG tablet Take 200 mg by mouth daily.     apixaban  (ELIQUIS ) 2.5 MG TABS tablet Take 1 tablet (2.5 mg total) by mouth 2 (two) times daily. 180 tablet 3   carvedilol  (COREG ) 25 MG tablet TAKE 2 TABLETS (50 MG TOTAL) BY MOUTH 2 (TWO) TIMES DAILY WITH A MEAL. 360 tablet 2   Cyanocobalamin  (VITAMIN B-12 PO) Take 1 tablet by mouth daily.     cyanocobalamin  (VITAMIN B12) 1000 MCG tablet Take 1,000 mcg by mouth daily.     empagliflozin  (JARDIANCE ) 10 MG TABS tablet Take 1 tablet (10 mg total) by mouth daily before breakfast. 90 tablet 3   Evolocumab  (REPATHA  SURECLICK) 140 MG/ML SOAJ Inject 140 mg into the skin every 14 (fourteen) days. 6 mL 2   ferrous sulfate  325 (65 FE) MG tablet Take 325 mg by  mouth daily at 6 (six) AM.     Finerenone (KERENDIA) 10 MG TABS Take 1 tablet (10 mg total) by mouth daily. 30 tablet 11   glipiZIDE  (GLUCOTROL ) 10 MG tablet Take 10 mg by mouth 2 (two) times daily.     JARDIANCE  10 MG TABS tablet Take 1 tablet by mouth once daily 90 tablet 0   Multiple Vitamin (MULTIVITAMIN WITH MINERALS) TABS tablet Take 1 tablet by mouth daily.      SEMAGLUTIDE Taylor Inject into the skin.     torsemide  (DEMADEX ) 20 MG tablet Take 0.5 tablets (10 mg total) by mouth every other day. PLEASE SCHEDULE APPOINTMENT FOR MORE REFILLS 30 tablet 0   triamcinolone cream (KENALOG) 0.1 % Apply 1 Application topically 2 (two) times daily as needed (rough skin patches).     Baclofen  5 MG TABS Take 5 mg by mouth at bedtime.     Continuous Glucose Receiver (DEXCOM G7 RECEIVER) DEVI SMARTSIG:1 Unit(s) Daily PRN (  Patient not taking: Reported on 12/12/2023)     Continuous Glucose Sensor (DEXCOM G7 SENSOR) MISC 1 Units by Does not apply route. (Patient not taking: Reported on 12/12/2023)     DULoxetine (CYMBALTA) 60 MG capsule Take 60 mg by mouth daily.     ezetimibe  (ZETIA ) 10 MG tablet Take 1 tablet (10 mg total) by mouth daily. 90 tablet 3   insulin  lispro (HUMALOG ) 100 UNIT/ML injection Inject 0-10 Units into the skin 2 (two) times daily as needed for high blood sugar. Per sliding scale. (Patient not taking: Reported on 12/12/2023)     ONETOUCH ULTRA test strip 1 each by Other route as needed. (Patient not taking: Reported on 12/12/2023)     No current facility-administered medications for this encounter.   Wt Readings from Last 3 Encounters:  12/12/23 88.9 kg (196 lb)  11/27/23 88.7 kg (195 lb 8 oz)  10/31/23 89.7 kg (197 lb 12.8 oz)   BP (!) 162/74   Pulse 72   Wt 88.9 kg (196 lb)   SpO2 98%   BMI 28.12 kg/m  General: NAD Neck: No JVD, no thyromegaly or thyroid  nodule.  Lungs: Clear to auscultation bilaterally with normal respiratory effort. CV: Nondisplaced PMI.  Heart regular S1/S2, no  S3/S4, no murmur.  No peripheral edema.  No carotid bruit.  Normal pedal pulses.  Abdomen: Soft, nontender, no hepatosplenomegaly, no distention.  Skin: Intact without lesions or rashes.  Neurologic: Alert and oriented x 3.  Psych: Normal affect. Extremities: No clubbing or cyanosis.  HEENT: Normal.   Assessment/Plan: 1. Chronic diastolic CHF: Echo 12/22 showed EF 55-60%, mild LVH, normal RV, moderate LAE.  There was concern for cardiac amyloidosis with bilateral carpal tunnel syndrome and biceps tendon rupture.  PYP scan was not suggestive of TTR cardiac amyloidosis, but myeloma panel and urine immunofixation were suggestive of monoclonal gammopathy. Bone marrow biopsy showed plasma cell neoplasm with >10% clonal plasma cells, biopsy staining was negative for amyloidosis.  Given lack of rapid progression of cardiac disease, suspect he does not have cardiac AL amyloidosis.  He is unable to get cardiac MRI, PPM is not compatible.  Echo in 9/24 showed EF 50-55%, moderate LVH, mild MR, mild-moderate TR, moderate RV dysfunction on my review, biatrial enlargement, IVC not dilated. Echo today on my review showed EF 50-55%, mild LVH, mild RV enlargement, mild RV dysfunction.  NYHA class II symptoms, stable.  He is not volume overloaded on exam.  Of note, he is now 100% v-paced.  - Start on finerenone 10 mg daily. BMET/BNP today, BMET in 10 days.  - Continue torsemide  10 mg every other day.  - Continue Jardiance  10 mg daily.   - LV systolic function does not appear to have worsened with RV pacing on today's echo.  If EF falls over time, will need to consider CRT upgrade.  2. Monoclonal gammopathy: Bone marrow biopsy in 3/23 with plasma cell neoplasm with >10% clonal plasma cells, staining was negative for amyloidosis.  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  3. CAD: S/p CABG and redo.  Cath in 11/20 showed patent grafts.  He denies chest pain.  - He is no longer taking Plavix  or ASA given Eliquis  use.  - He has been  unable to take statins due to myalgias, currently on Zetia  and Repatha .  Goal LDL < 55, check lipids today.   4. HTN: BP elevated, adding finerenone.  5. Atrial fibrillation: Paroxysmal. No recent AF on device interrogation.  He has a history of GI  bleeding but is tolerating Eliquis .  - Continue amiodarone  200 daily. Recent LFTs normal, check TSH today. He will need a regular eye exam.  - He is not a Watchman candidate, he has a partially closed LA appendage from surgical clip.  - Continue Eliquis  2.5 mg bid (lower dose with age 74, creatinine > 1.5). If he has recurrent GI bleeding, may have to stop again.   6. H/o complete heart block: He has a MDT PPM.  7. CKD: Stage 4. Recent admit with AKI after GI illness. Most recent creatinine 1.93.  - He sees nephrology.   Followup 3 months with APP.   I spent 31 minutes reviewing records, interviewing/examining patient, and managing orders.   Ezra Shuck  12/12/2023

## 2023-12-24 ENCOUNTER — Ambulatory Visit (HOSPITAL_COMMUNITY)
Admission: RE | Admit: 2023-12-24 | Discharge: 2023-12-24 | Disposition: A | Discharge status: Home / Self Care | Source: Ambulatory Visit | Attending: Cardiology | Admitting: Cardiology

## 2023-12-24 DIAGNOSIS — I5032 Chronic diastolic (congestive) heart failure: Secondary | ICD-10-CM | POA: Insufficient documentation

## 2023-12-24 LAB — BASIC METABOLIC PANEL WITH GFR
Anion gap: 10 (ref 5–15)
BUN: 39 mg/dL — ABNORMAL HIGH (ref 8–23)
CO2: 24 mmol/L (ref 22–32)
Calcium: 9.2 mg/dL (ref 8.9–10.3)
Chloride: 102 mmol/L (ref 98–111)
Creatinine, Ser: 2.38 mg/dL — ABNORMAL HIGH (ref 0.61–1.24)
GFR, Estimated: 27 mL/min — ABNORMAL LOW (ref >60)
Glucose, Bld: 95 mg/dL (ref 70–99)
Potassium: 4.4 mmol/L (ref 3.5–5.1)
Sodium: 136 mmol/L (ref 135–145)

## 2023-12-25 ENCOUNTER — Ambulatory Visit: Admitting: Primary Care

## 2023-12-25 DIAGNOSIS — G4733 Obstructive sleep apnea (adult) (pediatric): Secondary | ICD-10-CM

## 2023-12-25 DIAGNOSIS — J984 Other disorders of lung: Secondary | ICD-10-CM

## 2023-12-25 NOTE — Telephone Encounter (Addendum)
 Pt aware, agreeable, and verbalized understanding   Labs ordered and scheduled   ----- Message from Ezra Shuck sent at 12/24/2023  5:12 PM EST ----- Creatinine higher, repeat BMET in 1 week to make sure not trending up.  ----- Message ----- From: Rebecka, Lab [facility] [place] Sent: 12/24/2023   3:52 PM EST To: Ezra GORMAN Shuck, MD

## 2023-12-27 ENCOUNTER — Encounter: Payer: Self-pay | Admitting: Cardiology

## 2023-12-31 ENCOUNTER — Other Ambulatory Visit (HOSPITAL_COMMUNITY): Payer: Self-pay

## 2023-12-31 ENCOUNTER — Other Ambulatory Visit: Payer: Self-pay | Admitting: Cardiology

## 2023-12-31 ENCOUNTER — Ambulatory Visit (HOSPITAL_COMMUNITY): Payer: Self-pay | Admitting: Cardiology

## 2023-12-31 ENCOUNTER — Ambulatory Visit (HOSPITAL_COMMUNITY)
Admission: RE | Admit: 2023-12-31 | Discharge: 2023-12-31 | Disposition: A | Discharge status: Home / Self Care | Source: Ambulatory Visit | Attending: Cardiology | Admitting: Cardiology

## 2023-12-31 DIAGNOSIS — I5032 Chronic diastolic (congestive) heart failure: Secondary | ICD-10-CM | POA: Diagnosis present

## 2023-12-31 DIAGNOSIS — E7800 Pure hypercholesterolemia, unspecified: Secondary | ICD-10-CM

## 2023-12-31 DIAGNOSIS — I25708 Atherosclerosis of coronary artery bypass graft(s), unspecified, with other forms of angina pectoris: Secondary | ICD-10-CM

## 2023-12-31 LAB — BASIC METABOLIC PANEL WITH GFR
Anion gap: 11 (ref 5–15)
BUN: 38 mg/dL — ABNORMAL HIGH (ref 8–23)
CO2: 24 mmol/L (ref 22–32)
Calcium: 9.4 mg/dL (ref 8.9–10.3)
Chloride: 103 mmol/L (ref 98–111)
Creatinine, Ser: 2.24 mg/dL — ABNORMAL HIGH (ref 0.61–1.24)
GFR, Estimated: 29 mL/min — ABNORMAL LOW (ref >60)
Glucose, Bld: 127 mg/dL — ABNORMAL HIGH (ref 70–99)
Potassium: 4.3 mmol/L (ref 3.5–5.1)
Sodium: 138 mmol/L (ref 135–145)

## 2023-12-31 MED ORDER — REPATHA SURECLICK 140 MG/ML ~~LOC~~ SOAJ
140.0000 mg | SUBCUTANEOUS | 3 refills | Status: AC
  Filled 2023-12-31 – 2024-01-08 (×2): qty 6, 84d supply, fill #0

## 2024-01-01 ENCOUNTER — Ambulatory Visit: Payer: Medicare HMO

## 2024-01-01 DIAGNOSIS — I495 Sick sinus syndrome: Secondary | ICD-10-CM | POA: Diagnosis not present

## 2024-01-01 DIAGNOSIS — I25708 Atherosclerosis of coronary artery bypass graft(s), unspecified, with other forms of angina pectoris: Secondary | ICD-10-CM

## 2024-01-02 LAB — CUP PACEART REMOTE DEVICE CHECK
Battery Impedance: 2158 Ohm
Battery Remaining Longevity: 30 mo
Battery Voltage: 2.73 V
Brady Statistic AP VP Percent: 100 %
Brady Statistic AP VS Percent: 0 %
Brady Statistic AS VP Percent: 0 %
Brady Statistic AS VS Percent: 0 %
Date Time Interrogation Session: 20251125100100
Implantable Lead Connection Status: 753985
Implantable Lead Connection Status: 753985
Implantable Lead Implant Date: 20080208
Implantable Lead Implant Date: 20140130
Implantable Lead Location: 753859
Implantable Lead Location: 753860
Implantable Lead Model: 5076
Implantable Lead Model: 5076
Implantable Pulse Generator Implant Date: 20140130
Lead Channel Impedance Value: 551 Ohm
Lead Channel Impedance Value: 718 Ohm
Lead Channel Pacing Threshold Amplitude: 0.875 V
Lead Channel Pacing Threshold Amplitude: 0.875 V
Lead Channel Pacing Threshold Pulse Width: 0.4 ms
Lead Channel Pacing Threshold Pulse Width: 0.4 ms
Lead Channel Setting Pacing Amplitude: 2 V
Lead Channel Setting Pacing Amplitude: 2.5 V
Lead Channel Setting Pacing Pulse Width: 0.4 ms
Lead Channel Setting Sensing Sensitivity: 2 mV
Zone Setting Status: 755011
Zone Setting Status: 755011

## 2024-01-04 ENCOUNTER — Ambulatory Visit: Payer: Self-pay | Admitting: Cardiovascular Disease

## 2024-01-04 NOTE — Progress Notes (Signed)
 Remote PPM Transmission

## 2024-01-07 ENCOUNTER — Encounter: Payer: Self-pay | Admitting: Primary Care

## 2024-01-07 ENCOUNTER — Ambulatory Visit: Admitting: Primary Care

## 2024-01-07 VITALS — BP 124/64 | HR 80 | Temp 97.3°F | Ht 69.5 in | Wt 191.0 lb

## 2024-01-07 DIAGNOSIS — G4733 Obstructive sleep apnea (adult) (pediatric): Secondary | ICD-10-CM | POA: Diagnosis not present

## 2024-01-07 DIAGNOSIS — Z0000 Encounter for general adult medical examination without abnormal findings: Secondary | ICD-10-CM

## 2024-01-07 DIAGNOSIS — Z23 Encounter for immunization: Secondary | ICD-10-CM

## 2024-01-07 DIAGNOSIS — J984 Other disorders of lung: Secondary | ICD-10-CM | POA: Diagnosis not present

## 2024-01-07 NOTE — Patient Instructions (Signed)
  VISIT SUMMARY: You came to the clinic today to receive your flu shot, which had been postponed twice. You also discussed your history of sleep apnea and current management strategies.  YOUR PLAN: -INFLUENZA VACCINATION: You received a high dose flu shot today because you are over 82 years old. You have no history of adverse reactions to the flu shot or allergies to eggs, so it is safe for you to receive it.  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 Your condition has improved from moderate to mild since 1999. You are currently managing it with positional therapy and weight loss. If needed, you can consider using an over-the-counter oral appliance. You have been referred to Dr. Pawar for follow-up with a new sleep specialist.  INSTRUCTIONS: Please follow up with Dr. Pawar for your sleep apnea management. Continue with your current positional therapy and weight loss efforts. If you experience any issues, consider using an over-the-counter oral appliance.

## 2024-01-07 NOTE — Progress Notes (Signed)
 @Patient  ID: Alan [name], male    DOB: 1940-03-31, 83 y.o.   MRN: [id number]  Chief Complaint  Patient presents with   Obstructive Sleep Apnea    Manages osa with weight management     Referring provider: [name], MD  HPI: 83 year old male never smoker followed for OSA, history UPPP. Complicated by Post polio, AFib/ pacemaker, CAD/CABG,  DM 2, HBP, gout,  DDD,      PCP Dr Sophronia NPSG 05/30/97- AHI 20/ hr, desaturation to 78%, body weight 230 lbs Office Spirometry-02/26/2018- Mild Restriction of exhaled volume.  FVC 2.9/69%, FEV1 2.4/81%, ratio 1.84, FEF 25-75% 3.1/145%. -----------------------------------------------------------------------------  Previous [facility] pulmonary encounter:  06/27/2023 Discussed the use of AI scribe software for clinical note transcription with the patient, who gave verbal consent to proceed.  History of Present Illness   Alan [name] is an 83 year old male with sleep apnea who presents with difficulties tolerating CPAP therapy. He was referred by Dr. Neysa for evaluation of his sleep apnea and consideration of Inspire therapy.  He experiences significant difficulties with CPAP therapy for sleep apnea. The mask causes redness and pain on the top of his nose and shifts during the night, leading to breathing difficulties. He finds the CPAP frustrating and hard to tolerate.  He has a history of atrial fibrillation and is currently taking amiodarone  and a blood thinner. He monitors his heart rate and blood pressure at home and has not noticed any abnormalities. About a month ago, he was hospitalized due to pacemaker issues, and a cardiologist spent several hours adjusting it. He describes a recent episode of feeling clammy and disoriented after a shower, but these symptoms resolved on his own.  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 He enjoys watching planes and uses an app to track flights globally. He  occasionally visits the airport to watch planes and sometimes goes flying with friends to relive his past experiences.      Airview download 05/27/23-06/25/23 Usage days 30/30 days Average usage 8 hours 29 mins Pressure 12cm h20 Airleaks 45.9L/min (median); 103L/min (95%) AHI 3.7    10/02/2023 Discussed the use of AI scribe software for clinical note transcription with the patient, who gave verbal consent to proceed.  History of Present Illness   Alan [name] is an 83 year old male who presents for follow-up after a sleep study.  He underwent a sleep study on August 27, 2023, which revealed mild sleep apnea with 11.9 apneic events per hour, snoring, and periodic limb movement without arousal. The lowest oxygen saturation recorded was 89%.  He has a history of using CPAP for a long time but finds it uncomfortable, causing irritation to his nose and face. He is frustrated with the CPAP equipment and is seeking alternatives due to the discomfort and inconvenience.  He sleeps on his back and is considering positional changes such as sleeping on his side or using a wedge pillow to elevate his head to manage his sleep apnea.  He mentions financial concerns, including a significant increase in insurance copay and high medication costs, which impact his ability to pursue certain treatments.  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  1. OSA (obstructive sleep apnea) (Primary) Assessment & Plan Mild obstructive sleep apnea Patient has mild obstructive sleep apnea confirmed by an in-lab sleep study on 08/27/23 with 11.9 apneic events  per hour and lowest oxygen saturation of 89%. Patient is intolerant to CPAP and WOULD prefer to avoid PAP therapy. Risks include minimal risk of cardiac arrhythmia, stroke, pulmonary hypertension, and diabetes. CPAP is not necessary unless symptoms worsen. - Recommend positional therapy: avoid supine position, use lateral  sleeping or elevate head with a wedge pillow or adjustable bed to 30 degrees. - Advise weight management: aim for weight stability or a 10-15 pound weight loss. - Discuss over-the-counter oral appliance as a potential option for sleep apnea management. - We will not initiate CPAP therapy unless symptoms worsen or he desires it. - Surgical implant [facility]) is not indicated for mild sleep apnea.    01/07/2024- Interim hx  Discussed the use of AI scribe software for clinical note transcription with the patient, who gave verbal consent to proceed. History of Present Illness  Alan [name] is an 83 year old male who presents for an influenza vaccine.  He has a history of sleep apnea, with two sleep studies conducted in the past. The first study in 1999 indicated moderate sleep apnea, while a more recent study in July 2025 showed mild sleep apnea. He previously did not tolerate CPAP therapy and has since stopped using it. No current issues with sleep and states he is 'sleeping okay'. No breathing difficulties are reported.  He came to the clinic to receive a flu shot. No history of adverse reactions to the flu shot or allergies to eggs.   Sleep studies NPSG 05/30/97>> AHI 20/ hr, desaturation to 78%, body weight 230 lb PSG 08/27/23 >> AHI 11.9 with SpO2 low 89%   Office Spirometry-02/26/2018- Mild Restriction of exhaled volume.  FVC 2.9/69%, FEV1 2.4/81%, ratio 1.84, FEF 25-75% 3.1/145%.   No Known Allergies  Immunization History  Administered Date(s) Administered   Fluad Quad(high Dose 65+) 11/08/2015, 11/07/2016, 11/01/2018, 12/12/2019, 11/15/2020, 11/30/2021   Fluad Trivalent(High Dose 65+) 11/08/2011, 11/08/2015, 11/07/2016, 11/20/2022   INFLUENZA, HIGH DOSE SEASONAL PF 11/15/2012, 11/28/2013, 11/08/2015, 11/07/2016, 11/29/2017   Influenza Split 12/08/2010, 11/08/2011, 11/15/2012   Influenza Whole 11/06/2009, 11/07/2011   Influenza, Seasonal, Injecte, Preservative Fre 12/14/2014    Influenza,inj,Quad PF,6+ Mos 12/14/2014   Influenza,trivalent, recombinat, inj, PF 12/08/2010, 11/08/2011, 11/15/2012   PFIZER(Purple Top)SARS-COV-2 Vaccination 03/13/2019, 04/03/2019, 09/24/2019   Pneumococcal Conjugate-13 12/09/2012   Pneumococcal Polysaccharide-23 01/25/2015   Tdap 09/30/2010, 12/02/2020, 09/05/2022   Zoster Recombinant(Shingrix) 07/19/2017    Past Medical History:  Diagnosis Date   Anticoagulant long-term use    Arthritis    Arthropathy of left shoulder    Atypical mole    Biceps tendonitis, left    Bursitis of left shoulder    Cardiac pacemaker in situ    first insertion 2008;  genertor and new lead change 03-08-2012;   [employer]   CHB (complete heart block) (HCC)    s/p  PPM 2008   Chronic back pain    CKD (chronic kidney disease), stage III [facility])    Coronary artery disease cardiologist--- dr croitoru   hx  CABG x2  1990 and re-do 2004;  multiple PCI to RCA 1990 to 2004;    cardiac cath 2012  occluded LAD and RCA with patent grafts   Difficult intubation     Dr. Francyne said that it was difficulty to get the tube in.   History of coronary angioplasty    multiple PCI to RCA ,  1990 to 2004   History of GI bleed followed by GI-- [name] @ [facility] in  [place]   01/ 2020  upper and lower GI bleed ;  s/p  EGD with cautery of jejunal bleed and blood transfusion's   History of kidney stones    Hx of echocardiogram 06/29/2008   EF 45-50%    Hypertension    Ischemic cardiomyopathy    previously reported , ef 40-45%;  2010--- ef 45-50%;   last echo 2017 55-60%   Mixed hyperlipidemia    Nocturia    OSA on CPAP    cpap, 12, last sleep study Jan2013, sees Dr. 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[facility])    dx polio age 37;   09-30-2018  per pt occasional gait issues and occasion fall   Presence of permanent cardiac pacemaker    Pseudoarthrosis of lumbar  spine    Rotator cuff tear, left    S/P CABG x 2    1990 x2  and 2004  x2   Sinus node dysfunction (HCC)    2015--- sinus node arrest  with intact AV conduction   Squamous cell carcinoma of skin 06/14/2021   Mid Parietal Scalp (in situ) (tx p bx)   Type 2 diabetes mellitus (HCC)    followed by pcp    Tobacco History: Social History   Tobacco Use  Smoking Status Never  Smokeless Tobacco Never   Counseling given: Not Answered   Outpatient Medications Prior to Visit  Medication Sig Dispense Refill   amiodarone  (PACERONE ) 200 MG tablet Take 200 mg by mouth daily.     apixaban  (ELIQUIS ) 2.5 MG TABS tablet Take 1 tablet (2.5 mg total) by mouth 2 (two) times daily. 180 tablet 3   Baclofen  5 MG TABS Take 5 mg by mouth at bedtime.     carvedilol  (COREG ) 25 MG tablet TAKE 2 TABLETS (50 MG TOTAL) BY MOUTH 2 (TWO) TIMES DAILY WITH A MEAL. 360 tablet 2   Continuous Glucose Receiver (DEXCOM G7 RECEIVER) DEVI SMARTSIG:1 Unit(s) Daily PRN     Continuous Glucose Sensor (DEXCOM G7 SENSOR) MISC 1 Units by Does not apply route.     Cyanocobalamin  (VITAMIN B-12 PO) Take 1 tablet by mouth daily.     cyanocobalamin  (VITAMIN B12) 1000 MCG tablet Take 1,000 mcg by mouth daily.     DULoxetine (CYMBALTA) 60 MG capsule Take 60 mg by mouth daily.     empagliflozin  (JARDIANCE ) 10 MG TABS tablet Take 1 tablet (10 mg total) by mouth daily before breakfast. 90 tablet 3   Evolocumab  (REPATHA  SURECLICK) 140 MG/ML SOAJ Inject 140 mg into the skin every 14 (fourteen) days. 6 mL 3   ezetimibe  (ZETIA ) 10 MG tablet Take 1 tablet (10 mg total) by mouth daily. 90 tablet 3   ferrous sulfate  325 (65 FE) MG tablet Take 325 mg by mouth daily at 6 (six) AM.     Finerenone  (KERENDIA ) 10 MG TABS Take 1 tablet (10 mg total) by mouth daily. 30 tablet 11   glipiZIDE  (GLUCOTROL ) 10 MG tablet Take 10 mg by mouth 2 (two) times daily.     insulin  lispro (HUMALOG ) 100 UNIT/ML injection Inject 0-10 Units into the skin 2 (two) times  daily as needed for high blood sugar. Per sliding scale.     JARDIANCE  10 MG TABS tablet Take 1 tablet by mouth once daily 90 tablet 0   Multiple Vitamin (MULTIVITAMIN WITH MINERALS) TABS tablet Take 1 tablet by mouth daily.  ONETOUCH ULTRA test strip 1 each by Other route as needed.     SEMAGLUTIDE [place] Inject into the skin.     torsemide  (DEMADEX ) 20 MG tablet Take 0.5 tablets (10 mg total) by mouth every other day. PLEASE SCHEDULE APPOINTMENT FOR MORE REFILLS 30 tablet 0   triamcinolone cream (KENALOG) 0.1 % Apply 1 Application topically 2 (two) times daily as needed (rough skin patches).     No facility-administered medications prior to visit.      Review of Systems  Review of Systems  Constitutional: Negative.   HENT: Negative.    Respiratory: Negative.    Cardiovascular: Negative.      Physical Exam  BP 124/64   Pulse 80   Temp (!) 97.3 F (36.3 C)   Ht 5' 9.5 (1.765 m) Comment: pt stated  Wt 191 lb (86.6 kg)   SpO2 97% Comment: ra  BMI 27.80 kg/m  Physical Exam Constitutional:      Appearance: Normal appearance. He is well-developed. He is not ill-appearing.  HENT:     Head: Normocephalic and atraumatic.     Mouth/Throat:     Mouth: Mucous membranes are moist.     Pharynx: Oropharynx is clear.  Cardiovascular:     Rate and Rhythm: Normal rate and regular rhythm.     Heart sounds: Normal heart sounds.  Pulmonary:     Effort: Pulmonary effort is normal. No respiratory distress.     Breath sounds: Normal breath sounds. No wheezing or rhonchi.  Musculoskeletal:        General: Normal range of motion.     Cervical back: Normal range of motion and neck supple.  Skin:    General: Skin is warm and dry.     Findings: No erythema or rash.  Neurological:     General: No focal deficit present.     Mental Status: He is alert and oriented to person, place, and time. Mental status is at baseline.  Psychiatric:        Mood and Affect: Mood normal.        Behavior:  Behavior normal.        Thought Content: Thought content normal.        Judgment: Judgment normal.      Lab Results:  CBC    Component Value Date/Time   WBC 6.6 11/27/2023 1124   WBC 9.8 11/20/2023 2042   RBC 3.66 (L) 11/27/2023 1124   HGB 11.9 (L) 11/27/2023 1124   HGB 11.6 (L) 01/13/2019 1031   HCT 35.1 (L) 11/27/2023 1124   HCT 34.1 (L) 01/13/2019 1031   PLT 109 (L) 11/27/2023 1124   PLT 166 01/13/2019 1031   MCV 95.9 11/27/2023 1124   MCV 94 01/13/2019 1031   MCV 92 05/02/2012 1055   MCH 32.5 11/27/2023 1124   MCHC 33.9 11/27/2023 1124   RDW 12.5 11/27/2023 1124   RDW 12.3 01/13/2019 1031   RDW 13.5 05/02/2012 1055   LYMPHSABS 1.2 11/27/2023 1124   LYMPHSABS 1.2 05/02/2012 1055   MONOABS 0.6 11/27/2023 1124   MONOABS 0.6 05/02/2012 1055   EOSABS 0.2 11/27/2023 1124   EOSABS 0.1 05/02/2012 1055   BASOSABS 0.1 11/27/2023 1124   BASOSABS 0.1 05/02/2012 1055    BMET    Component Value Date/Time   NA 138 12/31/2023 1011   NA 134 01/11/2023 0918   NA 136 05/02/2012 1055   K 4.3 12/31/2023 1011   K 4.0 05/02/2012 1055   CL 103 12/31/2023  1011   CL 100 05/02/2012 1055   CO2 24 12/31/2023 1011   CO2 30 05/02/2012 1055   GLUCOSE 127 (H) 12/31/2023 1011   GLUCOSE 100 (H) 05/02/2012 1055   BUN 38 (H) 12/31/2023 1011   BUN 59 (H) 01/11/2023 0918   BUN 38 (H) 05/02/2012 1055   CREATININE 2.24 (H) 12/31/2023 1011   CREATININE 1.93 (H) 11/27/2023 1124   CREATININE 1.53 (H) 05/02/2012 1055   CALCIUM  9.4 12/31/2023 1011   CALCIUM  9.1 05/02/2012 1055   GFRNONAA 29 (L) 12/31/2023 1011   GFRNONAA 34 (L) 11/27/2023 1124   GFRAA 38 (L) 04/07/2019 0920    BNP    Component Value Date/Time   BNP 127.3 (H) 12/12/2023 1055    ProBNP No results found for: PROBNP  Imaging: CUP PACEART REMOTE DEVICE CHECK Result Date: 01/02/2024 PPM scheduled remote reviewed. Normal device function.  Presenting rhythm: AP-VP Next remote transmission per protocol. AB,  CVRS  ECHOCARDIOGRAM COMPLETE Result Date: 12/12/2023    ECHOCARDIOGRAM REPORT   Patient Name:   Alan [name] Date of Exam: 12/12/2023 Medical Rec #:  [id number]     Height:       70.0 in Accession #:    [id number]    Weight:       195.5 lb Date of Birth:  [birth year]    BSA:          2.067 m Patient Age:    82 years      BP:           155/69 mmHg Patient Gender: M             HR:           82 bpm. Exam Location:  Outpatient Procedure: 2D Echo and Intracardiac Opacification Agent (Both Spectral and Color            Flow Doppler were utilized during procedure). Indications:    CHF  History:        Patient has prior history of Echocardiogram examinations. CHF;                 Pacemaker.  Sonographer:    Alan Amour Referring Phys: [phone] DALTON S MCLEAN IMPRESSIONS  1. Left ventricular ejection fraction, by estimation, is 65 to 70%. Left ventricular ejection fraction by PLAX is 73 %. The left ventricle has normal function. The left ventricle has no regional wall motion abnormalities. The left ventricular internal cavity size was mildly dilated. There is mild left ventricular hypertrophy. Left ventricular diastolic parameters are consistent with Grade III diastolic dysfunction (restrictive).  2. Right ventricular systolic function is severely reduced. The right ventricular size is moderately enlarged. There is normal pulmonary artery systolic pressure.  3. Left atrial size was mild to moderately dilated.  4. Right atrial size was severely dilated.  5. The mitral valve is normal in structure. No evidence of mitral valve regurgitation. No evidence of mitral stenosis.  6. The aortic valve is tricuspid. Aortic valve regurgitation is not visualized. Aortic valve sclerosis is present, with no evidence of aortic valve stenosis.  7. Aortic dilatation noted. There is mild dilatation of the ascending aorta, measuring 40 mm.  8. The inferior vena cava is normal in size with greater than 50% respiratory variability, suggesting  right atrial pressure of 3 mmHg. Comparison(s): A prior study was performed on 10/20/2022. The left ventricular function has improved. FINDINGS  Left Ventricle: Left ventricular ejection fraction, by estimation, is 65 to 70%. Left ventricular  ejection fraction by PLAX is 73 %. The left ventricle has normal function. The left ventricle has no regional wall motion abnormalities. Definity  contrast agent was given IV to delineate the left ventricular endocardial borders. The left ventricular internal cavity size was mildly dilated. There is mild left ventricular hypertrophy. Left ventricular diastolic parameters are consistent with Grade III diastolic dysfunction (restrictive). Right Ventricle: The right ventricular size is moderately enlarged. Right vetricular wall thickness was not well visualized. Right ventricular systolic function is severely reduced. There is normal pulmonary artery systolic pressure. The tricuspid regurgitant velocity is 2.56 m/s, and with an assumed right atrial pressure of 3 mmHg, the estimated right ventricular systolic pressure is 29.2 mmHg. Left Atrium: Left atrial size was mild to moderately dilated. Right Atrium: Right atrial size was severely dilated. Pericardium: There is no evidence of pericardial effusion. Mitral Valve: The mitral valve is normal in structure. No evidence of mitral valve regurgitation. No evidence of mitral valve stenosis. Tricuspid Valve: The tricuspid valve is normal in structure. Tricuspid valve regurgitation is trivial. No evidence of tricuspid stenosis. Aortic Valve: The aortic valve is tricuspid. There is mild to moderate aortic valve annular calcification. Aortic valve regurgitation is not visualized. Aortic valve sclerosis is present, with no evidence of aortic valve stenosis. Aortic valve mean gradient measures 2.0 mmHg. Aortic valve peak gradient measures 4.2 mmHg. Aortic valve area, by VTI measures 4.23 cm. Pulmonic Valve: The pulmonic valve was normal in  structure. Pulmonic valve regurgitation is trivial. No evidence of pulmonic stenosis. Aorta: Aortic dilatation noted and the aortic root is normal in size and structure. There is mild dilatation of the ascending aorta, measuring 40 mm. Venous: The inferior vena cava is normal in size with greater than 50% respiratory variability, suggesting right atrial pressure of 3 mmHg. IAS/Shunts: No atrial level shunt detected by color flow Doppler. Additional Comments: A device lead is visualized in the right ventricle and right atrium.  LEFT VENTRICLE PLAX 2D LV EF:         Left            Diastology                ventricular     LV e' medial:    3.69 cm/s                ejection        LV E/e' medial:  20.1                fraction by     LV e' lateral:   5.00 cm/s                PLAX is 73      LV E/e' lateral: 14.8                %. LVIDd:         5.00 cm LVIDs:         2.90 cm LV PW:         1.20 cm LV IVS:        1.10 cm LVOT diam:     2.40 cm LV SV:         91 LV SV Index:   44 LVOT Area:     4.52 cm LV IVRT:       79 msec  LV Volumes (MOD) LV vol d, MOD    122.0 ml A2C: LV vol d, MOD    143.0 ml A4C: LV  vol s, MOD    28.9 ml A2C: LV vol s, MOD    39.4 ml A4C: LV SV MOD A2C:   93.1 ml LV SV MOD A4C:   143.0 ml LV SV MOD BP:    97.0 ml RIGHT VENTRICLE            IVC RV Basal diam:  4.40 cm    IVC diam: 1.10 cm RV S prime:     5.62 cm/s TAPSE (M-mode): 1.3 cm     PULMONARY VEINS                            Diastolic Velocity: 70.30 cm/s                            S/D Velocity:       0.30                            Systolic Velocity:  23.10 cm/s LEFT ATRIUM             Index        RIGHT ATRIUM           Index LA diam:        4.50 cm 2.18 cm/m   RA Area:     31.00 cm LA Vol (A2C):   58.5 ml 28.30 ml/m  RA Volume:   114.00 ml 55.15 ml/m LA Vol (A4C):   75.2 ml 36.38 ml/m LA Biplane Vol: 67.4 ml 32.60 ml/m  AORTIC VALVE                    PULMONIC VALVE AV Area (Vmax):    3.90 cm     PV Vmax:       0.91 m/s AV Area  (Vmean):   4.18 cm     PV Peak grad:  3.3 mmHg AV Area (VTI):     4.23 cm AV Vmax:           103.00 cm/s AV Vmean:          71.600 cm/s AV VTI:            0.216 m AV Peak Grad:      4.2 mmHg AV Mean Grad:      2.0 mmHg LVOT Vmax:         88.80 cm/s LVOT Vmean:        66.100 cm/s LVOT VTI:          0.202 m LVOT/AV VTI ratio: 0.94  AORTA Ao Root diam: 3.40 cm Ao Asc diam:  4.00 cm MITRAL VALVE               TRICUSPID VALVE MV Area (PHT): 2.87 cm    TR Peak grad:   26.2 mmHg MV Decel Time: 264 msec    TR Vmax:        256.00 cm/s MV E velocity: 74.10 cm/s MV A velocity: 29.10 cm/s  SHUNTS MV E/A ratio:  2.55        Systemic VTI:  0.20 m                            Systemic Diam: 2.40 cm Emeline Calender Electronically signed by Emeline Calender Signature Date/Time: 12/12/2023/2:09:14 PM    Final  Assessment & Plan:  1. OSA on CPAP (Primary)  2. Restrictive lung disease  3. Healthcare maintenance  Assessment & Plan  Mild obstructive sleep apnea Diagnosed in July 2025, improved from moderate sleep apnea in 1999. CPAP not tolerated previously. Current management includes positional therapy and weight loss. No current complaints or issues with sleep. Minimal risk for cardiac arrhythmia, pulmonary hypertension, and diabetes due to mild severity. - Continue positional sleep therapy and weight loss - Consider over-the-counter oral appliance if needed - Referred to Dr. Pawar for follow-up with a new sleep specialist for annual visit   Restrictive lung disease Stable, no acute respiratory complaints. CT chest in October 2025 so no acute abnormality of his chest.  -Encourage weight loss and regular physical activity.   Influenza vaccination Due to age greater than 46. No previous reaction to flu shot and no reported egg allergy. - Administered high dose influenza vaccine   Alan LELON Ferrari, NP 01/07/2024

## 2024-01-08 ENCOUNTER — Other Ambulatory Visit (HOSPITAL_COMMUNITY): Payer: Self-pay

## 2024-02-13 ENCOUNTER — Encounter: Payer: Self-pay | Admitting: Cardiovascular Disease

## 2024-02-29 ENCOUNTER — Other Ambulatory Visit (HOSPITAL_COMMUNITY): Payer: Self-pay

## 2024-02-29 ENCOUNTER — Telehealth (HOSPITAL_COMMUNITY): Payer: Self-pay

## 2024-02-29 NOTE — Telephone Encounter (Signed)
 Advanced Heart Failure Patient Advocate Encounter  Patient contacted office with concerns regarding copays. Patient stated that Kerendia  cost ~$600. Presumably this patient has a deductible that needs to be met for 2026.   Test billing through [facility] at this time returns $240.19 copay for 30 day supply. Likely patient has had other medications refilled already that are going toward deductible.   This patient does have a grant that will cover the cost of Carvedilol , Eliquis , Jardiance  to $0. Patient may be able to refill these medications under [employer] grant to help cover the cost of deductible before filling Kerendia .  Left voicemail for patient.  Rachel DEL, CPhT Rx Patient Advocate Phone: [phone]

## 2024-03-12 ENCOUNTER — Telehealth: Payer: Self-pay

## 2024-03-12 NOTE — Progress Notes (Incomplete)
 PCP: [name], MD Cardiology: Dr. Francyne HF Cardiology: Dr. Rolan  Chief complaint: CHF  84 y.o. with history of complete heart block s/p pacemaker, CAD s/p CABG and redo, CKD stage 3, GI bleeding, and chronic diastolic CHF was referred to CHF clinic from the [facility] clinic. Patient has a long history of CAD. He had his initial CABG in 1990 then redo in 2004. Last cath in 11/20 showed patent sequential LIMA-LAD/D and SVG-PDA, medical management.  He has a Medtronic PPM due to complete heart block, has had this for years.  He has paroxysmal atrial fibrillation, in NSR today. He is not on anticoagulation due to GI bleeding.  He has not had suitable anatomy for Watchman due to surgical clipping with partial closure of the LA appendage.   Patient has struggled recently with diastolic CHF.  He was admitted in 12/22 with CHF exacerbation.  Echo in 12/22 showed EF 55-60%, mild LVH, normal RV, moderate LAE. With diastolic CHF, PAF, biceps tendon rupture, and bilateral carpal tunnel syndrome, cardiac amyloidosis was considered.  PYP scan was not suggestive of TTR cardiac amyloidosis. 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 He has been seen by hematology, bone marrow biopsy in 3/23 showed plasma cell neoplasm with >10% clonal plasma cells, biopsy stained negative for amyloidosis.    In 5/24, he had inguinal hernia repair.   In 10/24, he was admitted to a hospital in [place] with suspected TIA.  BP was markedly high. CT head was negative, CTA head/neck showed no carotid stenosis.  He was unable to get an MRI due to his pacemaker. Echo showed EF 50-55%, moderate LVH, mild MR, mild-moderate TR, moderate RV dysfunction on my review, biatrial enlargement, IVC not dilated.   Follow up 11/24, NYHA III and mildly volume overloaded. Torsemide  increased to 10 mg daily and spiro 12.5 daily added. With recent TIA and partially closed LA appendage from surgical clip,  Plavix  stopped and Eliquis  2.5 mg bid started.   Admitted to [facility] 12/31/22 with AKI 2/2 acute gastroenteritis. Received IVF and abx. CT A/P unremarkable. SCr peaked at 2.54. He was discharged home, weight 198 lbs.  He was admitted overnight in 4/25 primarily due to palpitations.  He was noted to have intermittent v-pacing with type 1 2nd degree AV block.  He was converted over to DDDR pacing with shortened AV delay, committing him to A-V sequential pacing.  He says that this resolved his palpitations.   Echo 11/25 EF 50-55%, mild LVH, mild RV enlargement, mild RV dysfunction.   Today he returns for AHF follow up. Overall feeling ***. Denies palpitations, CP, dizziness, edema, or PND/Orthopnea. *** SOB. Appetite ok. No fever or chills. Weight at home *** pounds. Taking all medications. Denies ETOH, tobacco or drug use.   Medtronic device interrogation: 100% A-V sequential pacing, no AF. *** (Personally reviewed)    PMH: 1. Atrial fibrillation: Paroxysmal 2. Complete heart block: MDT PPM placed in his 50s.  3. Biceps tendon rupture 4. 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 HTN 7. Hyperlipidemia: h/o statin myopathy, muscle pain with Zetia , PCSK9 has been too expensive.  8. CKD stage 4 9. H/o GI bleeding: Not suitable Watchman candidate due to partial closure of the appendage by surgical clip.  He is not anticoagulated.  10. Chronic diastolic CHF: Echo (12/22) with EF 55-60%, mild LVH, normal RV, moderate LAE.  - PYP scan (1/23): grade 1, H/CL 0.8 - Echo (10/24): EF 50-55%, moderate LVH,  mild MR, mild-moderate TR, moderate RV dysfunction on my review, biatrial enlargement, IVC not dilated.  - Echo (10/25): EF 50-55%, mild LVH, mild RV enlargement, mild RV dysfunction.  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 Bone marrow biopsy 3/23 with plasma cell neoplasm with >10% clonal plasma cells, stain for amyloidosis was negative.  Smoldering  myeloma.  12. CAD: CABG x 2 in 1990, redo CABG x 2 in 2004.  - LHC (11/20): occluded proximal LAD, 99% dLCx, occluded proximal RCA, LIMA-LAD/diagonal patent, SVG-PDA patent. Distal LCx was small, managed medically.  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  14. TIA (10/24): CT head and CTA neck unremarkable.   Labs (1/23): K 4, creatinine 2.37, myeloma panel with IgG monoclonal protein with kappa light chain specificity, urine immunofixation with IgG monoclonal protein.  SH: Retired occupational hygienist, also was a firefighter.  Lives in [place].  Nonsmoker.  Rare ETOH.   Family History  Problem Relation Age of Onset   Other Father        MVA   Cancer Mother    ROS: All systems reviewed and negative except as per HPI.   Current Outpatient Medications  Medication Sig Dispense Refill   amiodarone  (PACERONE ) 200 MG tablet Take 200 mg by mouth daily.     apixaban  (ELIQUIS ) 2.5 MG TABS tablet Take 1 tablet (2.5 mg total) by mouth 2 (two) times daily. 180 tablet 3   Baclofen  5 MG TABS Take 5 mg by mouth at bedtime.     carvedilol  (COREG ) 25 MG tablet TAKE 2 TABLETS (50 MG TOTAL) BY MOUTH 2 (TWO) TIMES DAILY WITH A MEAL. 360 tablet 2   Continuous Glucose Receiver (DEXCOM G7 RECEIVER) DEVI SMARTSIG:1 Unit(s) Daily PRN     Continuous Glucose Sensor (DEXCOM G7 SENSOR) MISC 1 Units by Does not apply route.     Cyanocobalamin  (VITAMIN B-12 PO) Take 1 tablet by mouth daily.     cyanocobalamin  (VITAMIN B12) 1000 MCG tablet Take 1,000 mcg by mouth daily.     DULoxetine (CYMBALTA) 60 MG capsule Take 60 mg by mouth daily.     empagliflozin  (JARDIANCE ) 10 MG TABS tablet Take 1 tablet (10 mg total) by mouth daily before breakfast. 90 tablet 3   Evolocumab  (REPATHA  SURECLICK) 140 MG/ML SOAJ Inject 140 mg into the skin every 14 (fourteen) days. 6 mL 3   ezetimibe  (ZETIA ) 10 MG tablet Take 1 tablet (10 mg total) by mouth daily. 90 tablet 3   ferrous sulfate  325 (65 FE) MG tablet Take 325 mg by mouth daily at 6 (six) AM.     Finerenone   (KERENDIA ) 10 MG TABS Take 1 tablet (10 mg total) by mouth daily. 30 tablet 11   glipiZIDE  (GLUCOTROL ) 10 MG tablet Take 10 mg by mouth 2 (two) times daily.     insulin  lispro (HUMALOG ) 100 UNIT/ML injection Inject 0-10 Units into the skin 2 (two) times daily as needed for high blood sugar. Per sliding scale.     JARDIANCE  10 MG TABS tablet Take 1 tablet by mouth once daily 90 tablet 0   Multiple Vitamin (MULTIVITAMIN WITH MINERALS) TABS tablet Take 1 tablet by mouth daily.      ONETOUCH ULTRA test strip 1 each by Other route as needed.     SEMAGLUTIDE [place] Inject into the skin.     torsemide  (DEMADEX ) 20 MG tablet Take 0.5 tablets (10 mg total) by mouth every other day. PLEASE SCHEDULE APPOINTMENT FOR MORE REFILLS 30 tablet 0   triamcinolone cream (KENALOG)  0.1 % Apply 1 Application topically 2 (two) times daily as needed (rough skin patches).     No current facility-administered medications for this visit.   Wt Readings from Last 3 Encounters:  01/07/24 86.6 kg (191 lb)  12/12/23 88.9 kg (196 lb)  11/27/23 88.7 kg (195 lb 8 oz)   There were no vitals taken for this visit. Physical exam:  General:  *** appearing.  No respiratory difficulty Neck: JVD *** cm.  Cor: Regular rate & rhythm. No murmurs. Lungs: clear Extremities: no edema  Neuro: alert & oriented x 3. Affect pleasant.    Assessment/Plan: 1. Chronic diastolic CHF: Echo 12/22 showed EF 55-60%, mild LVH, normal RV, moderate LAE.  There was concern for cardiac amyloidosis with bilateral carpal tunnel syndrome and biceps tendon rupture.  PYP scan was not suggestive of TTR cardiac amyloidosis, but myeloma panel and urine immunofixation were suggestive of monoclonal gammopathy. Bone marrow biopsy showed plasma cell neoplasm with >10% clonal plasma cells, biopsy staining was negative for amyloidosis.  Given lack of rapid progression of cardiac disease, suspect he does not have cardiac AL amyloidosis.  He is unable to get cardiac MRI, PPM  is not compatible.  Echo in 9/24 showed EF 50-55%, moderate LVH, mild MR, mild-moderate TR, moderate RV dysfunction on my review, biatrial enlargement, IVC not dilated. Echo today on my review showed EF 50-55%, mild LVH, mild RV enlargement, mild RV dysfunction.  NYHA class II symptoms, stable.  He is not volume overloaded on exam.  Of note, he is now 100% v-paced.  - Start on finerenone  10 mg daily. BMET/BNP today, BMET in 10 days.  - Continue torsemide  10 mg every other day.  - Continue Jardiance  10 mg daily.   - LV systolic function does not appear to have worsened with RV pacing on today's echo.  If EF falls over time, will need to consider CRT upgrade.  2. Monoclonal gammopathy: Bone marrow biopsy in 3/23 with plasma cell neoplasm with >10% clonal plasma cells, staining was negative for amyloidosis.  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  3. CAD: S/p CABG and redo.  Cath in 11/20 showed patent grafts.  He denies chest pain.  - He is no longer taking Plavix  or ASA given Eliquis  use.  - He has been unable to take statins due to myalgias, currently on Zetia  and Repatha .  Goal LDL < 55, check lipids today.   4. HTN: BP elevated, adding finerenone .  5. Atrial fibrillation: Paroxysmal. No recent AF on device interrogation.  He has a history of GI bleeding but is tolerating Eliquis .  - Continue amiodarone  200 daily. Recent LFTs normal, check TSH today. He will need a regular eye exam.  - He is not a Watchman candidate, he has a partially closed LA appendage from surgical clip.  - Continue Eliquis  2.5 mg bid (lower dose with age 52, creatinine > 1.5). If he has recurrent GI bleeding, may have to stop again.   6. H/o complete heart block: He has a MDT PPM.  7. CKD: Stage 4. Recent admit with AKI after GI illness. Most recent creatinine 1.93.  - He sees nephrology.   Followup 3 months with APP. ***     Beckey LITTIE Coe  03/12/2024

## 2024-03-12 NOTE — Telephone Encounter (Signed)
 Called to confirm/remind patient of their appointment at the [facility] on 2/5/6.   Appointment:   [] Confirmed  [x] Left mess   [] No answer/No voice mail  [] VM Full/unable to leave message  [] Phone not in service  And to bring in all medications and/or complete list.

## 2024-03-13 ENCOUNTER — Ambulatory Visit (HOSPITAL_COMMUNITY)
Admission: RE | Admit: 2024-03-13 | Discharge: 2024-03-13 | Disposition: A | Source: Ambulatory Visit | Attending: Internal Medicine

## 2024-03-13 ENCOUNTER — Ambulatory Visit (HOSPITAL_COMMUNITY): Payer: Self-pay | Admitting: Internal Medicine

## 2024-03-13 ENCOUNTER — Encounter (HOSPITAL_COMMUNITY): Payer: Self-pay

## 2024-03-13 VITALS — BP 122/60 | HR 67 | Wt 185.0 lb

## 2024-03-13 DIAGNOSIS — I480 Paroxysmal atrial fibrillation: Secondary | ICD-10-CM

## 2024-03-13 DIAGNOSIS — N184 Chronic kidney disease, stage 4 (severe): Secondary | ICD-10-CM | POA: Diagnosis not present

## 2024-03-13 DIAGNOSIS — I25708 Atherosclerosis of coronary artery bypass graft(s), unspecified, with other forms of angina pectoris: Secondary | ICD-10-CM

## 2024-03-13 DIAGNOSIS — I442 Atrioventricular block, complete: Secondary | ICD-10-CM | POA: Diagnosis not present

## 2024-03-13 DIAGNOSIS — I10 Essential (primary) hypertension: Secondary | ICD-10-CM

## 2024-03-13 DIAGNOSIS — I5032 Chronic diastolic (congestive) heart failure: Secondary | ICD-10-CM | POA: Diagnosis not present

## 2024-03-13 LAB — BASIC METABOLIC PANEL WITH GFR
Anion gap: 9 (ref 5–15)
BUN: 29 mg/dL — ABNORMAL HIGH (ref 8–23)
CO2: 27 mmol/L (ref 22–32)
Calcium: 9.3 mg/dL (ref 8.9–10.3)
Chloride: 102 mmol/L (ref 98–111)
Creatinine, Ser: 1.93 mg/dL — ABNORMAL HIGH (ref 0.61–1.24)
GFR, Estimated: 34 mL/min — ABNORMAL LOW
Glucose, Bld: 142 mg/dL — ABNORMAL HIGH (ref 70–99)
Potassium: 4.6 mmol/L (ref 3.5–5.1)
Sodium: 139 mmol/L (ref 135–145)

## 2024-03-13 LAB — CBC
HCT: 32.5 % — ABNORMAL LOW (ref 39.0–52.0)
Hemoglobin: 10.9 g/dL — ABNORMAL LOW (ref 13.0–17.0)
MCH: 33.4 pg (ref 26.0–34.0)
MCHC: 33.5 g/dL (ref 30.0–36.0)
MCV: 99.7 fL (ref 80.0–100.0)
Platelets: 115 10*3/uL — ABNORMAL LOW (ref 150–400)
RBC: 3.26 MIL/uL — ABNORMAL LOW (ref 4.22–5.81)
RDW: 12.8 % (ref 11.5–15.5)
WBC: 5.9 10*3/uL (ref 4.0–10.5)
nRBC: 0 % (ref 0.0–0.2)

## 2024-03-13 LAB — PRO BRAIN NATRIURETIC PEPTIDE: Pro Brain Natriuretic Peptide: 635 pg/mL — ABNORMAL HIGH

## 2024-03-13 MED ORDER — SPIRONOLACTONE 25 MG PO TABS: 12.5000 mg | ORAL_TABLET | Freq: Every day | ORAL | 3 refills | Status: AC

## 2024-03-13 NOTE — Patient Instructions (Addendum)
 Good to see you today!  STOP kerendia   START Spironolactone  12.5 mg ( 1/2 tablet)daily  Labs done today, your results will be available in MyChart, we will contact you for abnormal readings.  Repeat lab work in 1 week  Your physician recommends that you schedule a follow-up appointment  as scheduled  If you have any questions or concerns before your next appointment please send us  a message through [place] or call our office at [phone].    TO LEAVE A MESSAGE FOR THE NURSE SELECT OPTION 2, PLEASE LEAVE A MESSAGE INCLUDING: YOUR NAME DATE OF BIRTH CALL BACK NUMBER REASON FOR CALL**this is important as we prioritize the call backs  YOU WILL RECEIVE A CALL BACK THE SAME DAY AS LONG AS YOU CALL BEFORE 4:00 PM At the [facility], you and your health needs are our priority. As part of our continuing mission to provide you with exceptional heart care, we have created designated Provider Care Teams. These Care Teams include your primary Cardiologist (physician) and Advanced Practice Providers (APPs- Physician Assistants and Nurse Practitioners) who all work together to provide you with the care you need, when you need it.   You may see any of the following providers on your designated Care Team at your next follow up: Dr Toribio Fuel Dr Ezra Shuck Dr. Morene Brownie Greig Mosses, NP Caffie Shed, [place] [facility], [place] Beckey Coe, NP Jordan Lee, NP Ellouise Class, NP Tinnie Redman, PharmD Jaun Bash, PharmD   Please be sure to bring in all your medications bottles to every appointment.    Thank you for choosing [facility]

## 2024-03-20 ENCOUNTER — Ambulatory Visit (HOSPITAL_COMMUNITY)

## 2024-03-31 ENCOUNTER — Inpatient Hospital Stay

## 2024-03-31 ENCOUNTER — Inpatient Hospital Stay: Admitting: Hematology

## 2024-04-01 ENCOUNTER — Ambulatory Visit: Payer: Medicare HMO

## 2024-06-16 ENCOUNTER — Ambulatory Visit (HOSPITAL_COMMUNITY): Admitting: Cardiology

## 2024-07-01 ENCOUNTER — Ambulatory Visit

## 2024-09-30 ENCOUNTER — Ambulatory Visit

## 2024-12-30 ENCOUNTER — Ambulatory Visit

## 2025-03-31 ENCOUNTER — Ambulatory Visit
